# Patient Record
Sex: Male | Born: 1945
Health system: Southern US, Community
[De-identification: ages and names within clinical notes are randomized; demographics above are authoritative.]

## PROBLEM LIST (undated history)

## (undated) DIAGNOSIS — E119 Type 2 diabetes mellitus without complications: Secondary | ICD-10-CM

## (undated) DIAGNOSIS — T7840XA Allergy, unspecified, initial encounter: Secondary | ICD-10-CM

## (undated) DIAGNOSIS — I739 Peripheral vascular disease, unspecified: Secondary | ICD-10-CM

## (undated) DIAGNOSIS — L0390 Cellulitis, unspecified: Secondary | ICD-10-CM

## (undated) DIAGNOSIS — I10 Essential (primary) hypertension: Secondary | ICD-10-CM

## (undated) DIAGNOSIS — E785 Hyperlipidemia, unspecified: Secondary | ICD-10-CM

## (undated) DIAGNOSIS — C9590 Leukemia, unspecified not having achieved remission: Secondary | ICD-10-CM

## (undated) HISTORY — DX: Allergy, unspecified, initial encounter: T78.40XA

## (undated) HISTORY — DX: Peripheral vascular disease, unspecified: I73.9

## (undated) HISTORY — PX: HERNIA REPAIR: SHX51

## (undated) HISTORY — DX: Hyperlipidemia, unspecified: E78.5

## (undated) HISTORY — DX: Cellulitis, unspecified: L03.90

---

## 2006-11-29 ENCOUNTER — Emergency Department: Payer: Self-pay | Admitting: Emergency Medicine

## 2007-03-20 ENCOUNTER — Other Ambulatory Visit: Payer: Self-pay

## 2007-03-20 ENCOUNTER — Ambulatory Visit: Payer: Self-pay | Admitting: Orthopaedic Surgery

## 2007-04-23 ENCOUNTER — Ambulatory Visit: Payer: Self-pay | Admitting: Orthopaedic Surgery

## 2008-06-04 ENCOUNTER — Ambulatory Visit: Payer: Self-pay | Admitting: Urology

## 2008-06-17 ENCOUNTER — Ambulatory Visit: Payer: Self-pay | Admitting: Urology

## 2008-06-24 ENCOUNTER — Ambulatory Visit: Payer: Self-pay | Admitting: Urology

## 2009-12-16 ENCOUNTER — Ambulatory Visit: Payer: Self-pay | Admitting: Ophthalmology

## 2009-12-29 ENCOUNTER — Ambulatory Visit: Payer: Self-pay | Admitting: Ophthalmology

## 2010-01-26 ENCOUNTER — Ambulatory Visit: Payer: Self-pay | Admitting: Ophthalmology

## 2010-02-02 ENCOUNTER — Ambulatory Visit: Payer: Self-pay | Admitting: Ophthalmology

## 2015-04-26 DIAGNOSIS — G4733 Obstructive sleep apnea (adult) (pediatric): Secondary | ICD-10-CM | POA: Diagnosis not present

## 2015-04-26 DIAGNOSIS — N189 Chronic kidney disease, unspecified: Secondary | ICD-10-CM | POA: Diagnosis not present

## 2015-04-26 DIAGNOSIS — E1122 Type 2 diabetes mellitus with diabetic chronic kidney disease: Secondary | ICD-10-CM | POA: Diagnosis not present

## 2015-04-26 DIAGNOSIS — N182 Chronic kidney disease, stage 2 (mild): Secondary | ICD-10-CM | POA: Diagnosis not present

## 2015-05-10 DIAGNOSIS — H401131 Primary open-angle glaucoma, bilateral, mild stage: Secondary | ICD-10-CM | POA: Diagnosis not present

## 2015-07-22 DIAGNOSIS — I10 Essential (primary) hypertension: Secondary | ICD-10-CM | POA: Diagnosis not present

## 2015-07-22 DIAGNOSIS — E1141 Type 2 diabetes mellitus with diabetic mononeuropathy: Secondary | ICD-10-CM | POA: Diagnosis not present

## 2015-07-22 DIAGNOSIS — G4733 Obstructive sleep apnea (adult) (pediatric): Secondary | ICD-10-CM | POA: Diagnosis not present

## 2015-07-22 DIAGNOSIS — E1165 Type 2 diabetes mellitus with hyperglycemia: Secondary | ICD-10-CM | POA: Diagnosis not present

## 2015-08-21 ENCOUNTER — Emergency Department
Admission: EM | Admit: 2015-08-21 | Discharge: 2015-08-21 | Disposition: A | Discharge status: Home / Self Care | Payer: Medicare Other | Attending: Emergency Medicine | Admitting: Emergency Medicine

## 2015-08-21 ENCOUNTER — Emergency Department: Payer: Medicare Other

## 2015-08-21 ENCOUNTER — Encounter: Payer: Self-pay | Admitting: Emergency Medicine

## 2015-08-21 DIAGNOSIS — I10 Essential (primary) hypertension: Secondary | ICD-10-CM | POA: Insufficient documentation

## 2015-08-21 DIAGNOSIS — S92532B Displaced fracture of distal phalanx of left lesser toe(s), initial encounter for open fracture: Secondary | ICD-10-CM | POA: Diagnosis not present

## 2015-08-21 DIAGNOSIS — S99922A Unspecified injury of left foot, initial encounter: Secondary | ICD-10-CM | POA: Diagnosis present

## 2015-08-21 DIAGNOSIS — S91312A Laceration without foreign body, left foot, initial encounter: Secondary | ICD-10-CM

## 2015-08-21 DIAGNOSIS — S93119A Dislocation of interphalangeal joint of unspecified toe(s), initial encounter: Secondary | ICD-10-CM | POA: Diagnosis not present

## 2015-08-21 DIAGNOSIS — Y999 Unspecified external cause status: Secondary | ICD-10-CM | POA: Diagnosis not present

## 2015-08-21 DIAGNOSIS — S92512A Displaced fracture of proximal phalanx of left lesser toe(s), initial encounter for closed fracture: Secondary | ICD-10-CM | POA: Diagnosis not present

## 2015-08-21 DIAGNOSIS — S93105A Unspecified dislocation of left toe(s), initial encounter: Secondary | ICD-10-CM | POA: Diagnosis not present

## 2015-08-21 DIAGNOSIS — Y939 Activity, unspecified: Secondary | ICD-10-CM | POA: Insufficient documentation

## 2015-08-21 DIAGNOSIS — S93115A Dislocation of interphalangeal joint of left lesser toe(s), initial encounter: Secondary | ICD-10-CM | POA: Diagnosis not present

## 2015-08-21 DIAGNOSIS — S92512B Displaced fracture of proximal phalanx of left lesser toe(s), initial encounter for open fracture: Secondary | ICD-10-CM | POA: Diagnosis not present

## 2015-08-21 DIAGNOSIS — E119 Type 2 diabetes mellitus without complications: Secondary | ICD-10-CM | POA: Insufficient documentation

## 2015-08-21 DIAGNOSIS — Y929 Unspecified place or not applicable: Secondary | ICD-10-CM | POA: Diagnosis not present

## 2015-08-21 DIAGNOSIS — S92912B Unspecified fracture of left toe(s), initial encounter for open fracture: Secondary | ICD-10-CM

## 2015-08-21 DIAGNOSIS — W268XXA Contact with other sharp object(s), not elsewhere classified, initial encounter: Secondary | ICD-10-CM | POA: Diagnosis not present

## 2015-08-21 HISTORY — DX: Essential (primary) hypertension: I10

## 2015-08-21 HISTORY — DX: Type 2 diabetes mellitus without complications: E11.9

## 2015-08-21 MED ORDER — CEFAZOLIN IN D5W 1 GM/50ML IV SOLN
1.0000 g | Freq: Once | INTRAVENOUS | Status: AC
  Administered 2015-08-21: 1 g via INTRAVENOUS
  Filled 2015-08-21: qty 50

## 2015-08-21 MED ORDER — TETANUS-DIPHTH-ACELL PERTUSSIS 5-2.5-18.5 LF-MCG/0.5 IM SUSP
0.5000 mL | Freq: Once | INTRAMUSCULAR | Status: AC
  Administered 2015-08-21: 0.5 mL via INTRAMUSCULAR
  Filled 2015-08-21: qty 0.5

## 2015-08-21 MED ORDER — LIDOCAINE HCL (PF) 1 % IJ SOLN
2.0000 mL | Freq: Once | INTRAMUSCULAR | Status: AC
  Administered 2015-08-21: 2 mL
  Filled 2015-08-21: qty 5

## 2015-08-21 MED ORDER — CEFAZOLIN SODIUM 1 G IJ SOLR
1.0000 g | Freq: Once | INTRAMUSCULAR | Status: DC
  Filled 2015-08-21: qty 10

## 2015-08-21 MED ORDER — CEPHALEXIN 500 MG PO CAPS: 500.0000 mg | ORAL_CAPSULE | Freq: Three times a day (TID) | ORAL | Status: AC

## 2015-08-21 MED ORDER — SULFAMETHOXAZOLE-TRIMETHOPRIM 800-160 MG PO TABS: 1.0000 | ORAL_TABLET | Freq: Two times a day (BID) | ORAL | Status: DC

## 2015-08-21 MED ORDER — HYDROCODONE-ACETAMINOPHEN 5-325 MG PO TABS: 1.0000 | ORAL_TABLET | Freq: Four times a day (QID) | ORAL | Status: DC | PRN

## 2015-08-21 NOTE — ED Notes (Signed)
Patient states that he fell this morning around 11 am. Patient was seen at [facility] urgent care and had x-ray done, was sent to ER for open fracture of the fifth digit on left foot.

## 2015-08-21 NOTE — ED Notes (Addendum)
States he fell   Injury to left 5 th toe  Went to [facility] urgent care   Was sent here d/t possible open fx ot toe  Was given rocephin IM Pta

## 2015-08-21 NOTE — ED Provider Notes (Signed)
CSN: [id number]     Arrival date & time 08/21/15  1353 History   First MD Initiated Contact with Patient 08/21/15 1418     Chief Complaint  Patient presents with  . Foot Pain     (Consider location/radiation/quality/duration/timing/severity/associated sxs/prior Treatment) HPI  70 year old male presents to the emergency department for evaluation of left fifth toe pain, laceration and fracture. He was seen just prior to arrival [facility], x-ray showed dislocation of the left fifth PIP joint with small avulsion fracture. Patient states his injury occurred at 11 AM this morning. He was standing on the porch when his knee gave out, and he believes he cut his left foot on the corner of a concrete. He was wearing flip-flops. His tetanus is not up-to-date. He has a laceration between the fourth and fifth toe along the webspace. His pain is 1 out of 10. He has a history of diabetes, last hemoglobin A1c was around 6. He does have some neuropathy in both feet that is mild. He is able to ambulate with no assisted devices. He has not had any medications for pain.  Past Medical History  Diagnosis Date  . Diabetes mellitus without complication ([place])   . Hypertension    Past Surgical History  Procedure Laterality Date  . Hernia repair     No family history on file. Social History  Substance Use Topics  . Smoking status: Never Smoker   . Smokeless tobacco: None  . Alcohol Use: No    Review of Systems  Constitutional: Negative.  Negative for fever, chills, activity change and appetite change.  HENT: Negative for congestion, ear pain, mouth sores, rhinorrhea, sinus pressure, sore throat and trouble swallowing.   Eyes: Negative for photophobia, pain and discharge.  Respiratory: Negative for cough, chest tightness and shortness of breath.   Cardiovascular: Negative for chest pain and leg swelling.  Gastrointestinal: Negative for nausea, vomiting, abdominal pain, diarrhea and abdominal  distention.  Genitourinary: Negative for dysuria and difficulty urinating.  Musculoskeletal: Negative for back pain, arthralgias and gait problem.  Skin: Positive for wound. Negative for color change and rash.  Neurological: Negative for dizziness and headaches.  Hematological: Negative for adenopathy.  Psychiatric/Behavioral: Negative for behavioral problems and agitation.      Allergies  Review of patient's allergies indicates no known allergies.  Home Medications   Prior to Admission medications   Medication Sig Start Date End Date Taking? Authorizing Provider  cephALEXin (KEFLEX) 500 MG capsule Take 1 capsule (500 mg total) by mouth 3 (three) times daily. X 7 days 08/21/15 08/31/15  Duanne Guess, PA-C  HYDROcodone-acetaminophen (NORCO) 5-325 MG tablet Take 1 tablet by mouth every 6 (six) hours as needed for moderate pain. 08/21/15   Duanne Guess, PA-C  sulfamethoxazole-trimethoprim (BACTRIM DS,SEPTRA DS) 800-160 MG tablet Take 1 tablet by mouth 2 (two) times daily. X 7 days 08/21/15   Duanne Guess, PA-C   BP 173/101 mmHg  Pulse 67  Temp(Src) 98 F (36.7 C) (Oral)  Resp 20  Ht 6\' 3"  (1.905 m)  Wt 158.759 kg  BMI 43.75 kg/m2  SpO2 97% Physical Exam  Constitutional: He is oriented to person, place, and time. He appears well-developed and well-nourished.  HENT:  Head: Normocephalic and atraumatic.  Eyes: Conjunctivae and EOM are normal. Pupils are equal, round, and reactive to light.  Neck: Normal range of motion. Neck supple.  Cardiovascular: Normal rate and intact distal pulses.   No murmur heard. Pulmonary/Chest: Effort normal  and breath sounds normal. No respiratory distress.  Musculoskeletal:  Examination of the left foot shows the patient has deformed abducted left fifth digit at the PIP joint. In between the fourth and fifth toe along the webspace there is a 4 cm linear laceration. Bleeding is well controlled. Sensation is intact throughout the left foot. He has 2+  cap refill throughout the digits. Laceration was reviewed with a bloodless field after significant thorough irrigation with Betadine and saline. There was no sign of foreign body. I was able to visualize the distal proximal phalanx, there was soft tissue present in between the proximal and middle phalanx. Soft tissue was removed and laceration was repaired. Fourth and fifth toe were buddy taped.  Neurological: He is alert and oriented to person, place, and time.  Skin: Skin is warm and dry.  Psychiatric: He has a normal mood and affect. His behavior is normal. Judgment and thought content normal.    ED Course  Procedures (including critical care time)  Closed reduction left fifth PIP joint: Patient underwent a closed reduction of the left fifth PIP joint of the left foot. Patient tolerated the procedure well. There was partial reduction. Patient underwent a second reduction after soft tissue was removed from the PIP joint. Second reduction was successful with good reduction. Toe was buddy taped. Patient tolerated procedure well.  LACERATION REPAIR Performed by: Feliberto Gottron Authorized by: Feliberto Gottron Consent: Verbal consent obtained. Risks and benefits: risks, benefits and alternatives were discussed Consent given by: patient Patient identity confirmed: provided demographic data Prepped and Draped in normal sterile fashion Wound explored  Laceration Location: Left foot  Laceration Length: 4cm  No Foreign Bodies seen or palpated  Anesthesia: local infiltration  Local anesthetic: lidocaine 1%  without epinephrine  Anesthetic total: 2 ml  Irrigation method: syringe Amount of cleaning: standard  Skin closure: #6 5-0 nylon   Number of sutures: 6   Technique: Simple interrupted   Patient tolerance: Patient tolerated the procedure well with no immediate complications.  Labs Review Labs Reviewed - No data to display  Imaging Review Dg Toe 5th  Left  08/21/2015  CLINICAL DATA:  Post reduction EXAM: DG TOE 5TH LEFT COMPARISON:  08/21/2015 FINDINGS: Partial reduction of the fifth PIP dislocation. Small fracture fragments noted. IMPRESSION: Partial reduction of fifth DIP joint. Small fracture fragments present around the joint. Electronically Signed   By: Franchot Gallo M.D.   On: 08/21/2015 15:41   Dg Toe 5th Left  08/21/2015  CLINICAL DATA:  Left fifth toe injury EXAM: DG TOE 5TH LEFT COMPARISON:  None. FINDINGS: There is anterolateral dislocation of the left fifth toe at the level of the proximal interphalangeal joint, with tiny densities surrounding the lateral margin of the proximal interphalangeal joint suspicious for tiny avulsion fragments. Surrounding soft tissue swelling. No suspicious focal osseous lesion. No radiopaque foreign body. IMPRESSION: Anterolateral dislocation of the left fifth toe at the PIP joint, with adjacent tiny densities suspicious for avulsion fragments. Electronically Signed   By: Ilona Sorrel M.D.   On: 08/21/2015 15:01   I have personally reviewed and evaluated these images and lab results as part of my medical decision-making.   EKG Interpretation None      MDM   Final diagnoses:  Toe joint dislocation, left, initial encounter  Fracture, toe, left, open, initial encounter  Laceration of foot, left, initial encounter    70 year old male with left fifth toe PIP fracture/dislocation/laceration with ultimately open fracture. He was given  Ancef 1 g IV. After 2 reductions there was significantly improved alignment of the PIP joint. Open fracture with laceration was thoroughly irrigated with saline and Betadine and repaired with 6 5-0 nylon sutures. The toes are buddy taped and a sterile dressing was applied. Patient will call Dr. Alvera Singh office first thing Monday morning. They will keep clean and dry. Keflex and Bactrim prescribed for prophylaxis. Norco as needed for pain. Return to ER for any worsening symptoms  or urgent changes in his health.    Duanne Guess, PA-C 08/21/15 1701  Lisa Roca, MD 08/22/15 [phone]

## 2015-08-21 NOTE — Discharge Instructions (Signed)
Toe Dislocation Toe dislocation is the displacement of the large bone of your toe (metatarsal) from the socket that connects it to your foot. Very strong, fibrous tissues (ligaments) connect your toe to a bone in your foot and stabilize the joint where these two bones meet. Dislocation is caused by a forceful impact to your toe. This impact moves your toe off the joint and often injures the ligaments that support it. SYMPTOMS Symptoms of toe dislocation include:  Noticeable deformity of your toe.  Pain, with loss of movement.  Looseness in your joint, indicating a tear of the ligaments (severe dislocations). DIAGNOSIS Toe dislocation is diagnosed through a physical exam. Often, X-ray exams are done to see if you have associated injuries, such as bone fractures. TREATMENT Toe dislocations are treated by putting your bones back into position (reduction) either by manipulation or through surgery. The toe is then kept in a fixed position (immobilized) in a cast, splint, or rigid postoperative shoe. In rare cases, surgical repair of a ligament is required, followed by immobilization of the toe.  HOME CARE INSTRUCTIONS The following measures can help to reduce pain and speed up the healing process:  Rest your injured joint. Do not move it. Avoid activities similar to the one that caused your injury.  Apply ice to your injured joint for 1 to 2 days after your reduction or as directed by your caregiver. Applying ice helps to reduce inflammation and pain.  Put ice in a plastic bag.  Place a towel between your skin and the bag.  Leave the ice on for 15 to 20 minutes at a time, every 2 hours while you are awake.  Elevate your foot above your heart as directed by your caregiver.  Take over-the-counter or prescription medicine for pain as directed by your caregiver.  If your caregiver has taped your toe to an adjacent toe, change the tape as directed by your caregiver. SEEK IMMEDIATE MEDICAL CARE  IF:  Your cast or splint becomes loose or damaged.  Your pain becomes worse, not better.  You lose feeling in your toe, or you cannot bend the tip of your toe. MAKE SURE YOU:  Understand these instructions.  Will watch your condition.  Will get help right away if you are not doing well or get worse.   This information is not intended to replace advice given to you by your health care provider. Make sure you discuss any questions you have with your health care provider.   Document Released: 11/01/2000 Document Revised: 02/27/2014 Document Reviewed: 08/12/2014 Elsevier Interactive Patient Education 2016 [employer]  Toe Fracture With Rehab A fracture is a break in the bone that can be either partial or complete. Fractures of the toe bones may or may not include the joints that separate the bones. SYMPTOMS   Severe pain over the fracture site at the time of injury that may persist for an extend period of time.  Pain, tenderness, inflammation, and/or bruising (contusion) over the fracture site.  Visible deformity, if the bone fragments are not properly aligned (displaced fracture).  Signs of vascular damage: numbness or coldness (uncommon). CAUSES  Toe fractures occur when a force is placed on the bone that is greater than it can withstand.  Direct hit (trauma) to the toe.  Indirect trauma to the toe, such as forcefully pivoting on a planted foot. RISK INCREASES WITH:  Performing activities barefoot (i.e. ballet, gymnastics).  Wearing shoes with little support or protection.  Sports with cleats (i.e.  football, rugby, lacrosse, soccer).  Bone disease (i.e. osteoporosis, bone tumors). PREVENTION   Wear properly fitted and protective shoes.  Protect previously injured toes with tape or padding. PROGNOSIS  If treated properly, toe fractures usually heal within 4 to 6 weeks. RELATED COMPLICATIONS   Failure of the fracture to heal (nonunion).  Healing of the fracture  in a poor position (malunion).  Recurring symptoms.  Recurring symptoms that result in a chronic problem.  Excessive bleeding, causing pressure on nerves and blood vessels (rare).  Arthritis of the affected joints.  Stopping of bone growth in children.  Infection in fractures where the skin is broken over the fracture (open fracture).  Shortening of injured bones. TREATMENT  Treatment first involves the use of ice and medicine to reduce pain and inflammation. The toe should be restrained for a period of time to allow for healing, usually about 4 weeks. Your caregiver may advise wearing a hard-soled shoe to minimize stress on the healing bone. Surgery is uncommon for this injury, but may be necessary if the fracture is severely displaced or if the bone pushes through the skin. Surgery typically involves the use of screws, pins, and/or plates to hold the fracture in place. After surgery, restraint of the foot is necessary. MEDICATION   If pain medicine is necessary, nonsteroidal anti-inflammatory medications (aspirin and ibuprofen), or other minor pain relievers (acetaminophen), are often recommended.  Do not take pain medicine for 7 days before surgery.  Prescription pain relievers may be given if your caregiver thinks they are needed. Use only as directed and only as much as you need. COLD THERAPY  Cold treatment (icing) relieves pain and reduces inflammation. Cold treatment should be applied for 10 to 15 minutes every 2 to 3 hours, and immediately after activity that aggravates your symptoms. Use ice packs or an ice massage. SEEK MEDICAL CARE IF:   Treatment does not seem to help, or the condition gets worse.  Any medicines produce negative side effects.  Any complications from surgery occur:  Pain, numbness, or coldness in the affected foot.  Discoloration beneath the toenails (blue or gray) of the affected foot.  Signs of infection (fever, pain, inflammation, redness, or  persistent bleeding). EXERCISES RANGE OF MOTION (ROM) AND STRETCHING EXERCISES - Toe Fracture (Phalangeal) These exercises may help you when beginning to rehabilitate your injury. Your symptoms may resolve with or without further involvement from your physician, physical therapist or athletic trainer. While completing these exercises, remember:   Restoring tissue flexibility helps normal motion to return to the joints. This allows healthier, less painful movement and activity.  An effective stretch should be held for at least 30 seconds.  A stretch should never be painful. You should only feel a gentle lengthening or release in the stretched tissue. RANGE OF MOTION - Dorsi/Plantar Flexion  While sitting with your right / left knee straight, draw the top of your foot upwards by flexing your ankle. Then reverse the motion, pointing your toes downward.  Hold each position for __________ seconds.  After completing your first set of exercises, repeat this exercise with your knee bent. Repeat __________ times. Complete this exercise __________ times per day.  RANGE OF MOTION - Ankle Alphabet Imagine your right / left big toe is a pen. Keeping your hip and knee still, write out the entire alphabet with your "pen." Make the letters as large as you can without increasing any discomfort. Repeat __________ times. Complete this exercise __________ times per day.  RANGE  OF MOTION - Toe Extension, Flexion  Sit with your right / left leg crossed over your opposite knee.  Grasp your toes and gently pull them back toward the top of your foot. You should feel a stretch on the bottom of your toes and foot.  Hold this stretch for __________ seconds.  Now, gently pull your toes toward the bottom of your foot. You should feel a stretch on the top of your toes and foot.  Hold this stretch for __________ seconds. Repeat __________ times. Complete this stretch__________ times per day.  STRENGTHENING  EXERCISES - Toe Fracture (Phalangeal) These exercises may help you when beginning to rehabilitate your injury. They may resolve your symptoms with or without further involvement from your physician, physical therapist or athletic trainer. While completing these exercises, remember:   Muscles can gain both the endurance and the strength needed for everyday activities through controlled exercises.  Complete these exercises as instructed by your physician, physical therapist or athletic trainer. Increase the resistance and repetitions only as guided.  You may experience muscle soreness or fatigue, but the pain or discomfort you are trying to eliminate should never worsen during these exercises. If this pain does get worse, stop and make sure you are following the directions exactly. If the pain is still present after adjustments, discontinue the exercise until you can discuss the trouble with your clinician. STRENGTH - Towel Curls  Sit in a chair, on a non-carpeted surface.  Place your foot on a towel, keeping your heel on the floor.  Pull the towel toward your heel only by curling your toes. Keep your heel on the floor.  If instructed by your physician, physical therapist or athletic trainer, add ____________________ at the end of the towel. Repeat __________ times. Complete this exercise __________ times per day.   This information is not intended to replace advice given to you by your health care provider. Make sure you discuss any questions you have with your health care provider.   Document Released: 02/06/2005 Document Revised: 06/23/2014 Document Reviewed: 05/21/2008 Elsevier Interactive Patient Education 2016 [place], Adult A laceration is a cut that goes through all of the layers of the skin and into the tissue that is right under the skin. Some lacerations heal on their own. Others need to be closed with stitches (sutures), staples, skin adhesive strips, or  skin glue. Proper laceration care minimizes the risk of infection and helps the laceration to heal better. HOW TO CARE FOR YOUR LACERATION If sutures or staples were used:  Keep the wound clean and dry.  If you were given a bandage (dressing), you should change it at least one time per day or as told by your health care provider. You should also change it if it becomes wet or dirty.  Keep the wound completely dry for the first 24 hours or as told by your health care provider. After that time, you may shower or bathe. However, make sure that the wound is not soaked in water until after the sutures or staples have been removed.  Clean the wound one time each day or as told by your health care provider:  Wash the wound with soap and water.  Rinse the wound with water to remove all soap.  Pat the wound dry with a clean towel. Do not rub the wound.  After cleaning the wound, apply a thin layer of antibiotic ointmentas told by your health care provider. This will help to prevent infection  and keep the dressing from sticking to the wound.  Have the sutures or staples removed as told by your health care provider. If skin adhesive strips were used:  Keep the wound clean and dry.  If you were given a bandage (dressing), you should change it at least one time per day or as told by your health care provider. You should also change it if it becomes dirty or wet.  Do not get the skin adhesive strips wet. You may shower or bathe, but be careful to keep the wound dry.  If the wound gets wet, pat it dry with a clean towel. Do not rub the wound.  Skin adhesive strips fall off on their own. You may trim the strips as the wound heals. Do not remove skin adhesive strips that are still stuck to the wound. They will fall off in time. If skin glue was used:  Try to keep the wound dry, but you may briefly wet it in the shower or bath. Do not soak the wound in water, such as by swimming.  After you have  showered or bathed, gently pat the wound dry with a clean towel. Do not rub the wound.  Do not do any activities that will make you sweat heavily until the skin glue has fallen off on its own.  Do not apply liquid, cream, or ointment medicine to the wound while the skin glue is in place. Using those may loosen the film before the wound has healed.  If you were given a bandage (dressing), you should change it at least one time per day or as told by your health care provider. You should also change it if it becomes dirty or wet.  If a dressing is placed over the wound, be careful not to apply tape directly over the skin glue. Doing that may cause the glue to be pulled off before the wound has healed.  Do not pick at the glue. The skin glue usually remains in place for 5-10 days, then it falls off of the skin. General Instructions  Take over-the-counter and prescription medicines only as told by your health care provider.  If you were prescribed an antibiotic medicine or ointment, take or apply it as told by your doctor. Do not stop using it even if your condition improves.  To help prevent scarring, make sure to cover your wound with sunscreen whenever you are outside after stitches are removed, after adhesive strips are removed, or when glue remains in place and the wound is healed. Make sure to wear a sunscreen of at least 30 SPF.  Do not scratch or pick at the wound.  Keep all follow-up visits as told by your health care provider. This is important.  Check your wound every day for signs of infection. Watch for:  Redness, swelling, or pain.  Fluid, blood, or pus.  Raise (elevate) the injured area above the level of your heart while you are sitting or lying down, if possible. SEEK MEDICAL CARE IF:  You received a tetanus shot and you have swelling, severe pain, redness, or bleeding at the injection site.  You have a fever.  A wound that was closed breaks open.  You notice a bad  smell coming from your wound or your dressing.  You notice something coming out of the wound, such as wood or glass.  Your pain is not controlled with medicine.  You have increased redness, swelling, or pain at the site of your wound.  You have fluid, blood, or pus coming from your wound.  You notice a change in the color of your skin near your wound.  You need to change the dressing frequently due to fluid, blood, or pus draining from the wound.  You develop a new rash.  You develop numbness around the wound. SEEK IMMEDIATE MEDICAL CARE IF:  You develop severe swelling around the wound.  Your pain suddenly increases and is severe.  You develop painful lumps near the wound or on skin that is anywhere on your body.  You have a red streak going away from your wound.  The wound is on your hand or foot and you cannot properly move a finger or toe.  The wound is on your hand or foot and you notice that your fingers or toes look pale or bluish.   This information is not intended to replace advice given to you by your health care provider. Make sure you discuss any questions you have with your health care provider.   Document Released: 02/06/2005 Document Revised: 06/23/2014 Document Reviewed: 02/02/2014 Elsevier Interactive Patient Education [employer].   Please keep the left fourth and fifth toe buddy taped. You can change the buddy tape daily. Please cleanse around the laceration 3 times daily with half Water and half hydrogen peroxide. Try to keep the laceration site clean and dry. Please take antibiotics as prescribed. Please call Dr. Alvera Singh office Monday morning to schedule an appointment.

## 2015-08-23 DIAGNOSIS — S91105A Unspecified open wound of left lesser toe(s) without damage to nail, initial encounter: Secondary | ICD-10-CM | POA: Diagnosis not present

## 2015-08-23 DIAGNOSIS — S93105A Unspecified dislocation of left toe(s), initial encounter: Secondary | ICD-10-CM | POA: Diagnosis not present

## 2015-08-27 DIAGNOSIS — S93105D Unspecified dislocation of left toe(s), subsequent encounter: Secondary | ICD-10-CM | POA: Diagnosis not present

## 2015-08-27 DIAGNOSIS — S91105D Unspecified open wound of left lesser toe(s) without damage to nail, subsequent encounter: Secondary | ICD-10-CM | POA: Diagnosis not present

## 2015-08-31 DIAGNOSIS — S93105D Unspecified dislocation of left toe(s), subsequent encounter: Secondary | ICD-10-CM | POA: Diagnosis not present

## 2015-09-07 DIAGNOSIS — S91105D Unspecified open wound of left lesser toe(s) without damage to nail, subsequent encounter: Secondary | ICD-10-CM | POA: Diagnosis not present

## 2015-09-07 DIAGNOSIS — S93105D Unspecified dislocation of left toe(s), subsequent encounter: Secondary | ICD-10-CM | POA: Diagnosis not present

## 2015-09-09 DIAGNOSIS — H401131 Primary open-angle glaucoma, bilateral, mild stage: Secondary | ICD-10-CM | POA: Diagnosis not present

## 2015-09-21 DIAGNOSIS — S93105D Unspecified dislocation of left toe(s), subsequent encounter: Secondary | ICD-10-CM | POA: Diagnosis not present

## 2015-09-21 DIAGNOSIS — S91105D Unspecified open wound of left lesser toe(s) without damage to nail, subsequent encounter: Secondary | ICD-10-CM | POA: Diagnosis not present

## 2015-10-26 DIAGNOSIS — E1140 Type 2 diabetes mellitus with diabetic neuropathy, unspecified: Secondary | ICD-10-CM | POA: Diagnosis not present

## 2015-10-26 DIAGNOSIS — E1165 Type 2 diabetes mellitus with hyperglycemia: Secondary | ICD-10-CM | POA: Diagnosis not present

## 2015-10-26 DIAGNOSIS — G4733 Obstructive sleep apnea (adult) (pediatric): Secondary | ICD-10-CM | POA: Diagnosis not present

## 2015-10-26 DIAGNOSIS — I10 Essential (primary) hypertension: Secondary | ICD-10-CM | POA: Diagnosis not present

## 2015-10-26 DIAGNOSIS — Z23 Encounter for immunization: Secondary | ICD-10-CM | POA: Diagnosis not present

## 2015-11-19 DIAGNOSIS — I10 Essential (primary) hypertension: Secondary | ICD-10-CM | POA: Diagnosis not present

## 2015-11-19 DIAGNOSIS — E1165 Type 2 diabetes mellitus with hyperglycemia: Secondary | ICD-10-CM | POA: Diagnosis not present

## 2015-11-19 DIAGNOSIS — J209 Acute bronchitis, unspecified: Secondary | ICD-10-CM | POA: Diagnosis not present

## 2015-11-19 DIAGNOSIS — G4733 Obstructive sleep apnea (adult) (pediatric): Secondary | ICD-10-CM | POA: Diagnosis not present

## 2015-11-23 DIAGNOSIS — J209 Acute bronchitis, unspecified: Secondary | ICD-10-CM | POA: Diagnosis not present

## 2015-11-23 DIAGNOSIS — E1122 Type 2 diabetes mellitus with diabetic chronic kidney disease: Secondary | ICD-10-CM | POA: Diagnosis not present

## 2015-11-23 DIAGNOSIS — G4733 Obstructive sleep apnea (adult) (pediatric): Secondary | ICD-10-CM | POA: Diagnosis not present

## 2015-11-23 DIAGNOSIS — E1141 Type 2 diabetes mellitus with diabetic mononeuropathy: Secondary | ICD-10-CM | POA: Diagnosis not present

## 2016-02-04 DIAGNOSIS — L820 Inflamed seborrheic keratosis: Secondary | ICD-10-CM | POA: Diagnosis not present

## 2016-02-04 DIAGNOSIS — L918 Other hypertrophic disorders of the skin: Secondary | ICD-10-CM | POA: Diagnosis not present

## 2016-02-04 DIAGNOSIS — L821 Other seborrheic keratosis: Secondary | ICD-10-CM | POA: Diagnosis not present

## 2016-02-04 DIAGNOSIS — D225 Melanocytic nevi of trunk: Secondary | ICD-10-CM | POA: Diagnosis not present

## 2016-03-14 DIAGNOSIS — H401131 Primary open-angle glaucoma, bilateral, mild stage: Secondary | ICD-10-CM | POA: Diagnosis not present

## 2016-03-21 DIAGNOSIS — H401131 Primary open-angle glaucoma, bilateral, mild stage: Secondary | ICD-10-CM | POA: Diagnosis not present

## 2016-07-07 DIAGNOSIS — E669 Obesity, unspecified: Secondary | ICD-10-CM | POA: Diagnosis not present

## 2016-07-07 DIAGNOSIS — R0602 Shortness of breath: Secondary | ICD-10-CM | POA: Diagnosis not present

## 2016-07-07 DIAGNOSIS — E784 Other hyperlipidemia: Secondary | ICD-10-CM | POA: Diagnosis not present

## 2016-07-07 DIAGNOSIS — E1165 Type 2 diabetes mellitus with hyperglycemia: Secondary | ICD-10-CM | POA: Diagnosis not present

## 2016-09-12 DIAGNOSIS — Z0000 Encounter for general adult medical examination without abnormal findings: Secondary | ICD-10-CM | POA: Diagnosis not present

## 2016-09-13 DIAGNOSIS — H401131 Primary open-angle glaucoma, bilateral, mild stage: Secondary | ICD-10-CM | POA: Diagnosis not present

## 2016-09-15 DIAGNOSIS — E119 Type 2 diabetes mellitus without complications: Secondary | ICD-10-CM | POA: Diagnosis not present

## 2016-09-15 DIAGNOSIS — R5381 Other malaise: Secondary | ICD-10-CM | POA: Diagnosis not present

## 2016-09-15 DIAGNOSIS — I10 Essential (primary) hypertension: Secondary | ICD-10-CM | POA: Diagnosis not present

## 2016-09-15 DIAGNOSIS — Z125 Encounter for screening for malignant neoplasm of prostate: Secondary | ICD-10-CM | POA: Diagnosis not present

## 2016-09-19 DIAGNOSIS — G4733 Obstructive sleep apnea (adult) (pediatric): Secondary | ICD-10-CM | POA: Diagnosis not present

## 2016-09-19 DIAGNOSIS — N182 Chronic kidney disease, stage 2 (mild): Secondary | ICD-10-CM | POA: Diagnosis not present

## 2016-09-19 DIAGNOSIS — I10 Essential (primary) hypertension: Secondary | ICD-10-CM | POA: Diagnosis not present

## 2016-09-19 DIAGNOSIS — E1141 Type 2 diabetes mellitus with diabetic mononeuropathy: Secondary | ICD-10-CM | POA: Diagnosis not present

## 2016-10-25 DIAGNOSIS — E669 Obesity, unspecified: Secondary | ICD-10-CM | POA: Diagnosis not present

## 2016-10-25 DIAGNOSIS — I10 Essential (primary) hypertension: Secondary | ICD-10-CM | POA: Diagnosis not present

## 2016-10-25 DIAGNOSIS — Z87438 Personal history of other diseases of male genital organs: Secondary | ICD-10-CM | POA: Diagnosis not present

## 2016-10-25 DIAGNOSIS — E1140 Type 2 diabetes mellitus with diabetic neuropathy, unspecified: Secondary | ICD-10-CM | POA: Diagnosis not present

## 2016-11-01 DIAGNOSIS — I10 Essential (primary) hypertension: Secondary | ICD-10-CM | POA: Diagnosis not present

## 2016-11-01 DIAGNOSIS — E1141 Type 2 diabetes mellitus with diabetic mononeuropathy: Secondary | ICD-10-CM | POA: Diagnosis not present

## 2016-11-01 DIAGNOSIS — E1140 Type 2 diabetes mellitus with diabetic neuropathy, unspecified: Secondary | ICD-10-CM | POA: Diagnosis not present

## 2016-11-01 DIAGNOSIS — Z87438 Personal history of other diseases of male genital organs: Secondary | ICD-10-CM | POA: Diagnosis not present

## 2016-11-15 DIAGNOSIS — E669 Obesity, unspecified: Secondary | ICD-10-CM | POA: Diagnosis not present

## 2016-11-15 DIAGNOSIS — E1122 Type 2 diabetes mellitus with diabetic chronic kidney disease: Secondary | ICD-10-CM | POA: Diagnosis not present

## 2016-11-15 DIAGNOSIS — N182 Chronic kidney disease, stage 2 (mild): Secondary | ICD-10-CM | POA: Diagnosis not present

## 2016-11-15 DIAGNOSIS — E1165 Type 2 diabetes mellitus with hyperglycemia: Secondary | ICD-10-CM | POA: Diagnosis not present

## 2016-12-13 DIAGNOSIS — E669 Obesity, unspecified: Secondary | ICD-10-CM | POA: Diagnosis not present

## 2016-12-13 DIAGNOSIS — I10 Essential (primary) hypertension: Secondary | ICD-10-CM | POA: Diagnosis not present

## 2016-12-13 DIAGNOSIS — E1165 Type 2 diabetes mellitus with hyperglycemia: Secondary | ICD-10-CM | POA: Diagnosis not present

## 2016-12-13 DIAGNOSIS — G4733 Obstructive sleep apnea (adult) (pediatric): Secondary | ICD-10-CM | POA: Diagnosis not present

## 2017-01-25 IMAGING — DX DG TOE 5TH 2+V*L*
3 series · 3 of 3 positions shown · non-contrast
Comparison: None.

CLINICAL DATA: Postreduction left fifth toe dislocation.

EXAM:
DG TOE 5TH LEFT

[toe ap]
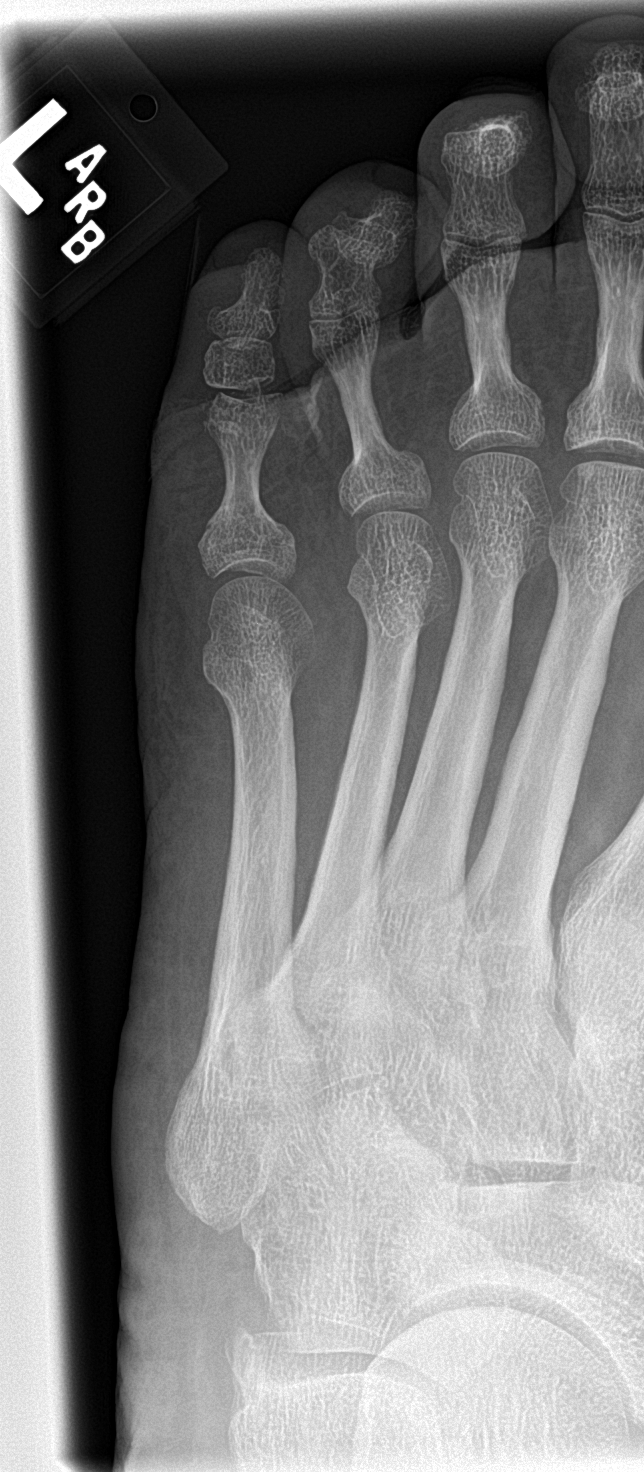

[toe obl]
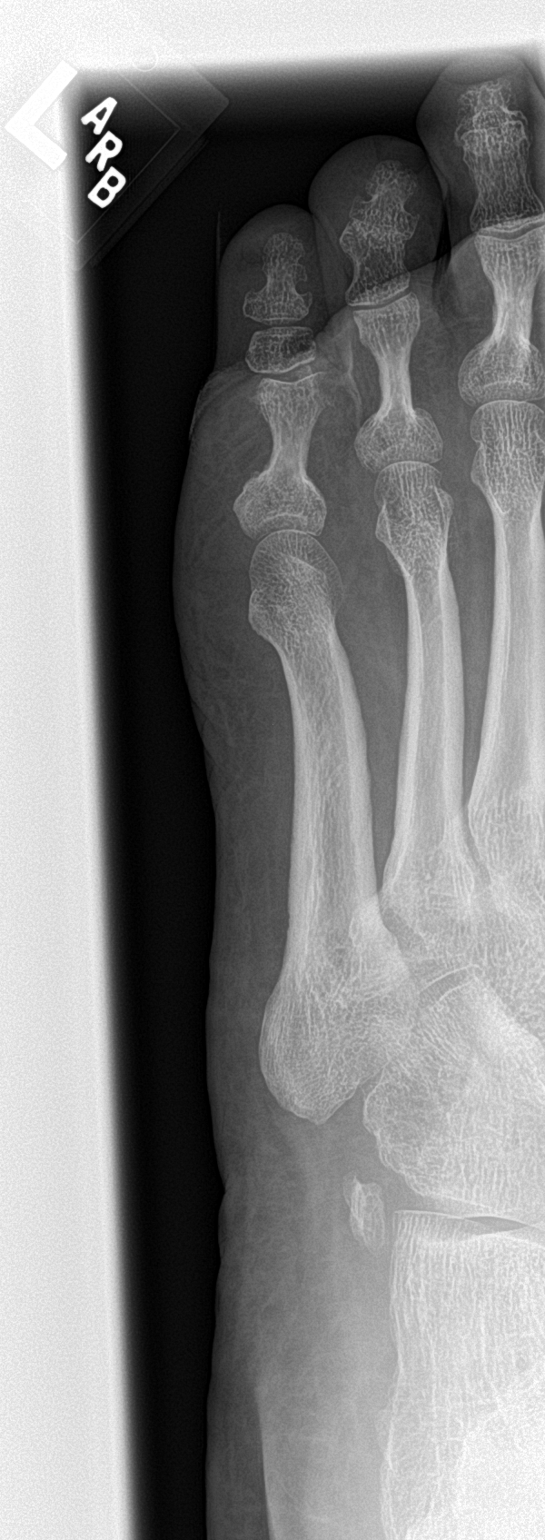

[toe lat]
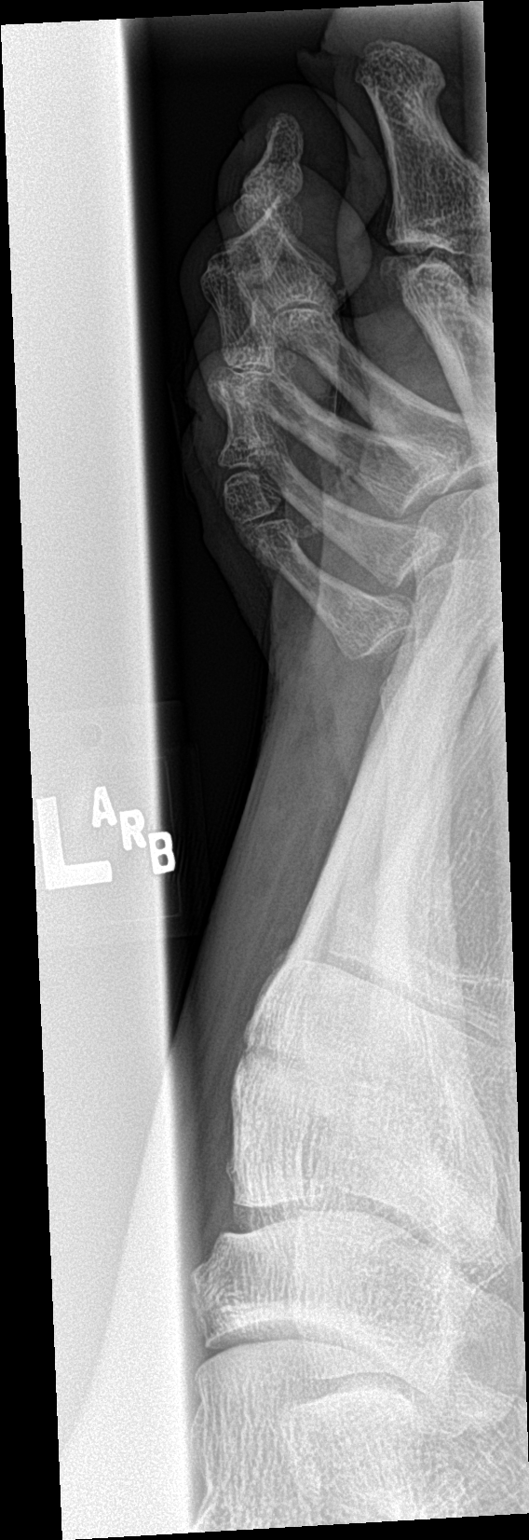

[3 of 3 positions shown; findings below may reference images not displayed]

FINDINGS: Interval improvement of alignment at the PIP joint of the little toe
in the interval. As noted on the prior study, tiny fracture
fragments are seen about the PIP joint.
IMPRESSION: Improved alignment since prior study although there may be some
minimal persistent subluxation of the PIP joint.

## 2017-03-05 DIAGNOSIS — H401131 Primary open-angle glaucoma, bilateral, mild stage: Secondary | ICD-10-CM | POA: Diagnosis not present

## 2017-03-14 DIAGNOSIS — H401131 Primary open-angle glaucoma, bilateral, mild stage: Secondary | ICD-10-CM | POA: Diagnosis not present

## 2017-03-22 DIAGNOSIS — E1165 Type 2 diabetes mellitus with hyperglycemia: Secondary | ICD-10-CM | POA: Diagnosis not present

## 2017-03-22 DIAGNOSIS — N182 Chronic kidney disease, stage 2 (mild): Secondary | ICD-10-CM | POA: Diagnosis not present

## 2017-03-22 DIAGNOSIS — E669 Obesity, unspecified: Secondary | ICD-10-CM | POA: Diagnosis not present

## 2017-03-23 DIAGNOSIS — I10 Essential (primary) hypertension: Secondary | ICD-10-CM | POA: Diagnosis not present

## 2017-03-23 DIAGNOSIS — E119 Type 2 diabetes mellitus without complications: Secondary | ICD-10-CM | POA: Diagnosis not present

## 2017-03-23 DIAGNOSIS — E7849 Other hyperlipidemia: Secondary | ICD-10-CM | POA: Diagnosis not present

## 2017-03-23 DIAGNOSIS — R5381 Other malaise: Secondary | ICD-10-CM | POA: Diagnosis not present

## 2017-03-30 DIAGNOSIS — E1140 Type 2 diabetes mellitus with diabetic neuropathy, unspecified: Secondary | ICD-10-CM | POA: Diagnosis not present

## 2017-03-30 DIAGNOSIS — E118 Type 2 diabetes mellitus with unspecified complications: Secondary | ICD-10-CM | POA: Diagnosis not present

## 2017-03-30 DIAGNOSIS — E1165 Type 2 diabetes mellitus with hyperglycemia: Secondary | ICD-10-CM | POA: Diagnosis not present

## 2017-03-30 DIAGNOSIS — G4733 Obstructive sleep apnea (adult) (pediatric): Secondary | ICD-10-CM | POA: Diagnosis not present

## 2017-04-09 ENCOUNTER — Ambulatory Visit (INDEPENDENT_AMBULATORY_CARE_PROVIDER_SITE_OTHER): Payer: Medicare Other | Admitting: Vascular Surgery

## 2017-04-09 ENCOUNTER — Encounter (INDEPENDENT_AMBULATORY_CARE_PROVIDER_SITE_OTHER): Payer: Self-pay | Admitting: Vascular Surgery

## 2017-04-09 VITALS — BP 159/90 | HR 74 | Resp 17 | Ht 74.0 in | Wt 366.0 lb

## 2017-04-09 DIAGNOSIS — I10 Essential (primary) hypertension: Secondary | ICD-10-CM | POA: Diagnosis not present

## 2017-04-09 DIAGNOSIS — R600 Localized edema: Secondary | ICD-10-CM

## 2017-04-09 DIAGNOSIS — E785 Hyperlipidemia, unspecified: Secondary | ICD-10-CM | POA: Insufficient documentation

## 2017-04-09 DIAGNOSIS — E118 Type 2 diabetes mellitus with unspecified complications: Secondary | ICD-10-CM | POA: Insufficient documentation

## 2017-04-09 DIAGNOSIS — L03115 Cellulitis of right lower limb: Secondary | ICD-10-CM

## 2017-04-09 MED ORDER — CEPHALEXIN 500 MG PO CAPS: 500.0000 mg | ORAL_CAPSULE | Freq: Four times a day (QID) | ORAL | 0 refills | Status: AC

## 2017-04-09 NOTE — Progress Notes (Signed)
Subjective:    Patient ID: Robert [name], male    DOB: [birth year], 72 y.o.   MRN: [id number] Chief Complaint  Patient presents with  . New Patient (Initial Visit)    Cellulitis   Presents as a new patient for evaluation of "recurrent cellulitis".  The patient endorses a long-standing history of "swelling" to the bilateral lower extremity for "years".  The patient notes the swelling usually brings on intermittent bouts of cellulitis which have been treated with p.o. Antibiotics.  Last bout of cellulitis was approximately 3 weeks ago.  The patient notes that the swelling to the bilateral lower extremity worsens throughout the day or with sitting/standing for long periods of time.  The patient denies any recent surgery or trauma to the bilateral lower extremity.  The patient denies any DVT history to the bilateral lower extremity.  The patient does not engage in conservative therapy at this time.  The patient's edema is also associated with some discomfort.  The patient notes his edema and his discomfort has slowly progressed over the years.  The patient notes his right calf is currently "red" and "painful".  The patient denies any fever, nausea vomiting.   Review of Systems  Constitutional: Negative.   HENT: Negative.   Eyes: Negative.   Respiratory: Negative.   Cardiovascular: Positive for leg swelling.  Gastrointestinal: Negative.   Endocrine: Negative.   Genitourinary: Negative.   Musculoskeletal: Negative.   Skin: Positive for color change.  Allergic/Immunologic: Negative.   Neurological: Negative.   Hematological: Negative.   Psychiatric/Behavioral: Negative.       Objective:   Physical Exam  Constitutional: He is oriented to person, place, and time. He appears well-developed and well-nourished. No distress.  HENT:  Head: Normocephalic and atraumatic.  Eyes: Conjunctivae are normal. Pupils are equal, round, and reactive to light.  Neck: Normal range of motion.    Cardiovascular: Normal rate, regular rhythm, normal heart sounds and intact distal pulses.  Pulses:      Radial pulses are 2+ on the right side, and 2+ on the left side.  Hard to palpate pedal pulses due to body habitus and edema  Pulmonary/Chest: Effort normal and breath sounds normal.  Musculoskeletal: Normal range of motion. He exhibits edema (Mild to moderate nonpitting edema noted bilaterally).  Neurological: He is alert and oriented to person, place, and time.  Skin: He is not diaphoretic.  This moderate to severe stasis dermatitis noted to the bilateral calves.  There is the beginning of skin thickening noted to the bilateral calves.  There is cellulitis noted to the right calf.  Skin is intact bilaterally.  Psychiatric: He has a normal mood and affect. His behavior is normal. Judgment and thought content normal.  Vitals reviewed.  BP (!) 159/90 (BP Location: Left Arm, Patient Position: Sitting)   Pulse 74   Resp 17   Ht 6\' 2"  (1.88 m)   Wt (!) 366 lb (166 kg)   BMI 46.99 kg/m   Past Medical History:  Diagnosis Date  . Allergy   . Cellulitis   . Diabetes mellitus without complication ([place])   . Hyperlipidemia   . Hypertension   . Peripheral vascular disease ([place])    Social History   Socioeconomic History  . Marital status: Married    Spouse name: Not on file  . Number of children: Not on file  . Years of education: Not on file  . Highest education level: Not on file  Social Needs  . Financial  resource strain: Not on file  . Food insecurity - worry: Not on file  . Food insecurity - inability: Not on file  . Transportation needs - medical: Not on file  . Transportation needs - non-medical: Not on file  Occupational History  . Not on file  Tobacco Use  . Smoking status: Never Smoker  . Smokeless tobacco: Never Used  Substance and Sexual Activity  . Alcohol use: No  . Drug use: Not on file  . Sexual activity: Not on file  Other Topics Concern  . Not on file   Social History Narrative  . Not on file   Past Surgical History:  Procedure Laterality Date  . HERNIA REPAIR     No family history on file.  No Known Allergies     Assessment & Plan:  Presents as a new patient for evaluation of "recurrent cellulitis".  The patient endorses a long-standing history of "swelling" to the bilateral lower extremity for "years".  The patient notes the swelling usually brings on intermittent bouts of cellulitis which have been treated with p.o. Antibiotics.  Last bout of cellulitis was approximately 3 weeks ago.  The patient notes that the swelling to the bilateral lower extremity worsens throughout the day or with sitting/standing for long periods of time.  The patient denies any recent surgery or trauma to the bilateral lower extremity.  The patient denies any DVT history to the bilateral lower extremity.  The patient does not engage in conservative therapy at this time.  The patient's edema is also associated with some discomfort.  The patient notes his edema and his discomfort has slowly progressed over the years.  The patient notes his right calf is currently "red" and "painful".  The patient denies any fever, nausea vomiting.  1. Cellulitis of right lower extremity - New Patient with mild cellulitis to the right calf Prescribed Keflex 500 mg 1 tab by mouth every 6 hours x 7 days If the patient's erythema should worsen or she should experience any fever, nausea vomiting the patient must call the office or seek medical attention in the emergency department The patient and his wife who was present during this examination expressed their understanding  - VAS [place] LOWER EXTREMITY VENOUS REFLUX; Future  2. Bilateral lower extremity edema - New The patient was encouraged to wear graduated compression stockings (20-30 mmHg) on a daily basis. The patient was instructed to begin wearing the stockings first thing in the morning and removing them in the evening. The  patient was instructed specifically not to sleep in the stockings. Prescription given Prescription was given for both conventional and farrow wrap compression socks In addition, behavioral modification including elevation during the day will be initiated. Anti-inflammatories for pain. The patient will follow up in one months to asses conservative management.  Information on chronic venous insufficiency and compression stockings was given to the patient. The patient was instructed to call the office in the interim if any worsening edema or ulcerations to the legs, feet or toes occurs. The patient expresses their understanding.  - VAS [place] LOWER EXTREMITY VENOUS REFLUX; Future  3. Essential hypertension - Stable Encouraged good control as its slows the progression of atherosclerotic disease  4. Type 2 diabetes mellitus with complication, unspecified whether long term insulin use (HCC) - Stable Encouraged good control as its slows the progression of atherosclerotic disease  5. Hyperlipidemia, unspecified hyperlipidemia type - [place] Encouraged good control as its slows the progression of atherosclerotic disease  Current Outpatient  Medications on File Prior to Visit  Medication Sig Dispense Refill  . amLODipine (NORVASC) 10 MG tablet   4  . aspirin EC 81 MG tablet Take 81 mg by mouth daily.    . cloNIDine (CATAPRES) 0.2 MG tablet   5  . clopidogrel (PLAVIX) 75 MG tablet   2  . DULoxetine (CYMBALTA) 30 MG capsule   3  . furosemide (LASIX) 40 MG tablet   2  . hydrochlorothiazide (HYDRODIURIL) 25 MG tablet   5  . HYDROcodone-acetaminophen (NORCO) 5-325 MG tablet Take 1 tablet by mouth every 6 (six) hours as needed for moderate pain. 15 tablet 0  . JANUVIA 100 MG tablet   2  . LANTUS SOLOSTAR 100 UNIT/ML Solostar Pen   2  . Latanoprost-Timolol Maleate 0.005-0.5 % SOLN INSTILL ONE DROP TO BOTH EYES AT BEDTIME    . lisinopril (PRINIVIL,ZESTRIL) 5 MG tablet   4  . loratadine (CLARITIN) 10 MG  tablet Take 10 mg by mouth daily as needed for allergies.    Marland Kitchen meclizine (ANTIVERT) 12.5 MG tablet   0  . metFORMIN (GLUCOPHAGE) 1000 MG tablet   2  . metoprolol succinate (TOPROL-XL) 100 MG 24 hr tablet   1  . potassium chloride (K-DUR,KLOR-CON) 10 MEQ tablet   3  . simvastatin (ZOCOR) 20 MG tablet   3  . sulfamethoxazole-trimethoprim (BACTRIM DS,SEPTRA DS) 800-160 MG tablet Take 1 tablet by mouth 2 (two) times daily. X 7 days 14 tablet 0   No current facility-administered medications on file prior to visit.    There are no Patient Instructions on file for this visit. No Follow-up on file.  Lealand Elting A Lyndle Pang, PA-C

## 2017-05-09 ENCOUNTER — Ambulatory Visit (INDEPENDENT_AMBULATORY_CARE_PROVIDER_SITE_OTHER): Payer: Medicare Other | Admitting: Vascular Surgery

## 2017-05-09 ENCOUNTER — Ambulatory Visit (INDEPENDENT_AMBULATORY_CARE_PROVIDER_SITE_OTHER): Payer: Medicare Other

## 2017-05-09 ENCOUNTER — Encounter (INDEPENDENT_AMBULATORY_CARE_PROVIDER_SITE_OTHER): Payer: Self-pay | Admitting: Vascular Surgery

## 2017-05-09 VITALS — BP 136/76 | HR 74 | Resp 18 | Wt 367.0 lb

## 2017-05-09 DIAGNOSIS — L03115 Cellulitis of right lower limb: Secondary | ICD-10-CM | POA: Diagnosis not present

## 2017-05-09 DIAGNOSIS — I890 Lymphedema, not elsewhere classified: Secondary | ICD-10-CM

## 2017-05-09 DIAGNOSIS — I872 Venous insufficiency (chronic) (peripheral): Secondary | ICD-10-CM | POA: Diagnosis not present

## 2017-05-09 DIAGNOSIS — R600 Localized edema: Secondary | ICD-10-CM | POA: Diagnosis not present

## 2017-05-09 DIAGNOSIS — E118 Type 2 diabetes mellitus with unspecified complications: Secondary | ICD-10-CM

## 2017-05-09 DIAGNOSIS — E785 Hyperlipidemia, unspecified: Secondary | ICD-10-CM | POA: Diagnosis not present

## 2017-05-09 NOTE — Progress Notes (Signed)
Subjective:    Patient ID: Robert [name], male    DOB: 06-16-1945, 72 y.o.   MRN: [id number] Chief Complaint  Patient presents with  . Follow-up    bil ven ultrasound   Patient presents for a monthly follow-up.  Since his initial visit, the patient has been engaging in conservative therapy including wearing medical grade one compression stockings, elevating his legs and remaining active with minimal improvement to his bilateral lower extremity edema.  The patient's cellulitis has resolved.  The patient's edema has progressed to the point that he is unable to function on a daily basis.  The patient feels that his symptoms are lifestyle limiting.  The patient underwent a bilateral lower extremity venous reflux study which was notable for abnormal reflux times noted in the right common femoral, right femoral vein and right great saphenous vein at the proximal thigh.  Abnormal reflux times were noted to the left femoral vein and left great saphenous vein.  The patient denies any fever, nausea vomiting.   Review of Systems  Constitutional: Negative.   HENT: Negative.   Eyes: Negative.   Respiratory: Negative.   Cardiovascular: Positive for leg swelling.       Chronic venous insufficiency Lymphedema  Gastrointestinal: Negative.   Endocrine: Negative.   Genitourinary: Negative.   Musculoskeletal: Negative.   Skin: Negative.   Allergic/Immunologic: Negative.   Neurological: Negative.   Hematological: Negative.   Psychiatric/Behavioral: Negative.       Objective:   Physical Exam  Constitutional: He is oriented to person, place, and time. He appears well-developed and well-nourished. No distress.  HENT:  Head: Normocephalic and atraumatic.  Eyes: Conjunctivae are normal. Pupils are equal, round, and reactive to light.  Neck: Normal range of motion.  Cardiovascular: Normal rate, regular rhythm, normal heart sounds and intact distal pulses.  Pulses:      Radial pulses are 2+ on the  right side, and 2+ on the left side.  Hard to palpate pedal pulses however the bilateral feet are warm  Pulmonary/Chest: Effort normal and breath sounds normal.  Musculoskeletal: Normal range of motion. He exhibits edema (Bilateral moderate lower extremity edema).  Neurological: He is alert and oriented to person, place, and time.  Skin: He is not diaphoretic.  There is moderate stasis dermatitis noted.  There is the beginning of skin thickening noted.  Cellulitis has resolved.  There are no ulcerations noted at this time.  Psychiatric: He has a normal mood and affect. His behavior is normal. Judgment and thought content normal.  Vitals reviewed.  BP 136/76 (BP Location: Right Arm)   Pulse 74   Resp 18   Wt (!) 367 lb (166.5 kg)   BMI 47.12 kg/m   Past Medical History:  Diagnosis Date  . Allergy   . Cellulitis   . Diabetes mellitus without complication ([place])   . Hyperlipidemia   . Hypertension   . Peripheral vascular disease ([place])    Social History   Socioeconomic History  . Marital status: Married    Spouse name: Not on file  . Number of children: Not on file  . Years of education: Not on file  . Highest education level: Not on file  Social Needs  . Financial resource strain: Not on file  . Food insecurity - worry: Not on file  . Food insecurity - inability: Not on file  . Transportation needs - medical: Not on file  . Transportation needs - non-medical: Not on file  Occupational History  .  Not on file  Tobacco Use  . Smoking status: Never Smoker  . Smokeless tobacco: Never Used  Substance and Sexual Activity  . Alcohol use: No  . Drug use: No  . Sexual activity: Not on file  Other Topics Concern  . Not on file  Social History Narrative  . Not on file   Past Surgical History:  Procedure Laterality Date  . HERNIA REPAIR     Family History  Problem Relation Age of Onset  . Heart disease Mother   . Stroke Mother   . Heart disease Father   . Heart attack  Father    No Known Allergies     Assessment & Plan:  Patient presents for a monthly follow-up.  Since his initial visit, the patient has been engaging in conservative therapy including wearing medical grade one compression stockings, elevating his legs and remaining active with minimal improvement to his bilateral lower extremity edema.  The patient's cellulitis has resolved.  The patient's edema has progressed to the point that he is unable to function on a daily basis.  The patient feels that his symptoms are lifestyle limiting.  The patient underwent a bilateral lower extremity venous reflux study which was notable for abnormal reflux times noted in the right common femoral, right femoral vein and right great saphenous vein at the proximal thigh.  Abnormal reflux times were noted to the left femoral vein and left great saphenous vein.  The patient denies any fever, nausea vomiting.  1. Chronic venous insufficiency - New Despite conservative treatments including exercise, elevation and class I compression stockings the patient still presents with stage II lymphedema The patient would greatly benefit from the added therapy of a lymphedema pump I will applied to the patient's insurance I will bring the patient back in 3 months to assess his progress with conservative therapy and lymphedema pump.  If there is no progress the patient may be a candidate for saphenous vein laser ablation bilaterally.  2. Lymphedema - New As above  3. Type 2 diabetes mellitus with complication, unspecified whether long term insulin use (HCC) - Stable Encouraged good control as its slows the progression of atherosclerotic disease  4. Hyperlipidemia, unspecified hyperlipidemia type - Stable Encouraged good control as its slows the progression of atherosclerotic disease  Current Outpatient Medications on File Prior to Visit  Medication Sig Dispense Refill  . amLODipine (NORVASC) 10 MG tablet   4  . aspirin EC 81 MG  tablet Take 81 mg by mouth daily.    . cloNIDine (CATAPRES) 0.2 MG tablet   5  . clopidogrel (PLAVIX) 75 MG tablet   2  . DULoxetine (CYMBALTA) 30 MG capsule   3  . furosemide (LASIX) 40 MG tablet   2  . hydrochlorothiazide (HYDRODIURIL) 25 MG tablet   5  . JANUVIA 100 MG tablet   2  . LANTUS SOLOSTAR 100 UNIT/ML Solostar Pen   2  . Latanoprost-Timolol Maleate 0.005-0.5 % SOLN INSTILL ONE DROP TO BOTH EYES AT BEDTIME    . lisinopril (PRINIVIL,ZESTRIL) 5 MG tablet   4  . loratadine (CLARITIN) 10 MG tablet Take 10 mg by mouth daily as needed for allergies.    Marland Kitchen meclizine (ANTIVERT) 12.5 MG tablet   0  . metFORMIN (GLUCOPHAGE) 1000 MG tablet   2  . metoprolol succinate (TOPROL-XL) 100 MG 24 hr tablet   1  . potassium chloride (K-DUR,KLOR-CON) 10 MEQ tablet   3  . simvastatin (ZOCOR) 20 MG tablet  3  . HYDROcodone-acetaminophen (NORCO) 5-325 MG tablet Take 1 tablet by mouth every 6 (six) hours as needed for moderate pain. (Patient not taking: Reported on 05/09/2017) 15 tablet 0  . sulfamethoxazole-trimethoprim (BACTRIM DS,SEPTRA DS) 800-160 MG tablet Take 1 tablet by mouth 2 (two) times daily. X 7 days (Patient not taking: Reported on 05/09/2017) 14 tablet 0   No current facility-administered medications on file prior to visit.    There are no Patient Instructions on file for this visit. No Follow-up on file.  Arfa Lamarca A Niclas Markell, PA-C

## 2017-06-12 DIAGNOSIS — I890 Lymphedema, not elsewhere classified: Secondary | ICD-10-CM | POA: Diagnosis not present

## 2017-08-08 DIAGNOSIS — I890 Lymphedema, not elsewhere classified: Secondary | ICD-10-CM | POA: Diagnosis not present

## 2017-08-13 ENCOUNTER — Ambulatory Visit (INDEPENDENT_AMBULATORY_CARE_PROVIDER_SITE_OTHER): Payer: Medicare Other | Admitting: Vascular Surgery

## 2017-08-22 DIAGNOSIS — Z0000 Encounter for general adult medical examination without abnormal findings: Secondary | ICD-10-CM | POA: Diagnosis not present

## 2017-09-12 DIAGNOSIS — E119 Type 2 diabetes mellitus without complications: Secondary | ICD-10-CM | POA: Diagnosis not present

## 2017-09-14 ENCOUNTER — Telehealth: Payer: Self-pay | Admitting: Gastroenterology

## 2017-09-14 ENCOUNTER — Other Ambulatory Visit: Payer: Self-pay

## 2017-09-14 DIAGNOSIS — Z1211 Encounter for screening for malignant neoplasm of colon: Secondary | ICD-10-CM

## 2017-09-14 MED ORDER — NA SULFATE-K SULFATE-MG SULF 17.5-3.13-1.6 GM/177ML PO SOLN: 1.0000 | Freq: Once | ORAL | 0 refills | Status: AC

## 2017-09-14 NOTE — Telephone Encounter (Signed)
Gastroenterology Pre-Procedure Review  Request Date: 09/26/17   [facility] Requesting Physician: Dr. Vicente Males  PATIENT REVIEW QUESTIONS: The patient responded to the following health history questions as indicated:    1. Are you having any GI issues? no 2. Do you have a personal history of Polyps? no 3. Do you have a family history of Colon Cancer or Polyps? no 4. Diabetes Mellitus? yes (Type II) 5. Joint replacements in the past 12 months?no 6. Major health problems in the past 3 months?no 7. Any artificial heart valves, MVP, or defibrillator?no    MEDICATIONS & ALLERGIES:    Patient reports the following regarding taking any anticoagulation/antiplatelet therapy:   Plavix, Coumadin, Eliquis, Xarelto, Lovenox, Pradaxa, Brilinta, or Effient? no Aspirin? yes (81 mg)  Patient confirms/reports the following medications:  Current Outpatient Medications  Medication Sig Dispense Refill  . amLODipine (NORVASC) 10 MG tablet   4  . aspirin EC 81 MG tablet Take 81 mg by mouth daily.    . cloNIDine (CATAPRES) 0.2 MG tablet   5  . clopidogrel (PLAVIX) 75 MG tablet   2  . DULoxetine (CYMBALTA) 30 MG capsule   3  . furosemide (LASIX) 40 MG tablet   2  . hydrochlorothiazide (HYDRODIURIL) 25 MG tablet   5  . HYDROcodone-acetaminophen (NORCO) 5-325 MG tablet Take 1 tablet by mouth every 6 (six) hours as needed for moderate pain. (Patient not taking: Reported on 05/09/2017) 15 tablet 0  . JANUVIA 100 MG tablet   2  . LANTUS SOLOSTAR 100 UNIT/ML Solostar Pen   2  . Latanoprost-Timolol Maleate 0.005-0.5 % SOLN INSTILL ONE DROP TO BOTH EYES AT BEDTIME    . lisinopril (PRINIVIL,ZESTRIL) 5 MG tablet   4  . loratadine (CLARITIN) 10 MG tablet Take 10 mg by mouth daily as needed for allergies.    Marland Kitchen meclizine (ANTIVERT) 12.5 MG tablet   0  . metFORMIN (GLUCOPHAGE) 1000 MG tablet   2  . metoprolol succinate (TOPROL-XL) 100 MG 24 hr tablet   1  . potassium chloride (K-DUR,KLOR-CON) 10 MEQ tablet   3  . simvastatin  (ZOCOR) 20 MG tablet   3  . sulfamethoxazole-trimethoprim (BACTRIM DS,SEPTRA DS) 800-160 MG tablet Take 1 tablet by mouth 2 (two) times daily. X 7 days (Patient not taking: Reported on 05/09/2017) 14 tablet 0   No current facility-administered medications for this visit.     Patient confirms/reports the following allergies:  No Known Allergies  No orders of the defined types were placed in this encounter.   AUTHORIZATION INFORMATION Primary Insurance: 1D#: Group #:  Secondary Insurance: 1D#: Group #:  SCHEDULE INFORMATION: Date: 09/26/17   Vicente Males Time: Location: [place]

## 2017-09-17 ENCOUNTER — Other Ambulatory Visit: Payer: Self-pay

## 2017-09-17 ENCOUNTER — Telehealth: Payer: Self-pay

## 2017-09-17 MED ORDER — PEG 3350-KCL-NABCB-NACL-NASULF 236 G PO SOLR: 4000.0000 mL | Freq: Once | ORAL | 0 refills | Status: AC

## 2017-09-17 NOTE — Telephone Encounter (Signed)
LVM for pt to contact office to reschedule his colonoscopy date from 09/26/17 with Dr. Vicente Males to possibly 09/27/17.  Waiting for call back.  Thanks [employer]

## 2017-09-21 ENCOUNTER — Telehealth: Payer: Self-pay

## 2017-09-21 ENCOUNTER — Other Ambulatory Visit: Payer: Self-pay

## 2017-09-21 NOTE — Telephone Encounter (Signed)
Patient stated that he received my message to reschedule his colonoscopy to 09/27/17 and is fine with that.  A blood thinner request will be sent to Dr. Jennette Kettle office electronically as the patient has an appt on Monday with him.  Thanks [employer]

## 2017-09-21 NOTE — Progress Notes (Signed)
Rx has been changed from [place] to [facility] with [employer].  Thanks [employer]

## 2017-09-24 NOTE — Telephone Encounter (Signed)
Patient has been informed per Dr. Lavera Guise to stop blood thinner 5 days prior to colonoscopy.  This made [place] unable to do colonoscopy as originally planned for 08/08.  His new date is 10/02/17 due to blood thinner stop time.  He has been asked to resume blood thinner 5 days after his colonoscopy.  Patient expressed understanding and will call office if he needs to reschedule after he checks with his wife.  Thanks [employer]

## 2017-09-24 NOTE — Telephone Encounter (Signed)
Patient called and left voicemail [phone] @4 :39PM to ask question about prep for procedure. Called patient and was sent to vm. Left message to call back if he still has questions.

## 2017-10-01 ENCOUNTER — Encounter: Payer: Self-pay | Admitting: *Deleted

## 2017-10-02 ENCOUNTER — Encounter
Admission: RE | Disposition: A | Discharge status: Home / Self Care | Payer: Self-pay | Source: Ambulatory Visit | Attending: Gastroenterology

## 2017-10-02 ENCOUNTER — Ambulatory Visit: Payer: Medicare Other | Admitting: Anesthesiology

## 2017-10-02 ENCOUNTER — Encounter: Payer: Self-pay | Admitting: Anesthesiology

## 2017-10-02 ENCOUNTER — Ambulatory Visit
Admission: RE | Admit: 2017-10-02 | Discharge: 2017-10-02 | Disposition: A | Discharge status: Home / Self Care | Payer: Medicare Other | Source: Ambulatory Visit | Attending: Gastroenterology | Admitting: Gastroenterology

## 2017-10-02 DIAGNOSIS — Z7982 Long term (current) use of aspirin: Secondary | ICD-10-CM | POA: Insufficient documentation

## 2017-10-02 DIAGNOSIS — D122 Benign neoplasm of ascending colon: Secondary | ICD-10-CM

## 2017-10-02 DIAGNOSIS — K635 Polyp of colon: Secondary | ICD-10-CM | POA: Diagnosis not present

## 2017-10-02 DIAGNOSIS — Z7902 Long term (current) use of antithrombotics/antiplatelets: Secondary | ICD-10-CM | POA: Insufficient documentation

## 2017-10-02 DIAGNOSIS — D124 Benign neoplasm of descending colon: Secondary | ICD-10-CM | POA: Diagnosis not present

## 2017-10-02 DIAGNOSIS — Z1211 Encounter for screening for malignant neoplasm of colon: Secondary | ICD-10-CM | POA: Diagnosis not present

## 2017-10-02 DIAGNOSIS — K5140 Inflammatory polyps of colon without complications: Secondary | ICD-10-CM | POA: Insufficient documentation

## 2017-10-02 DIAGNOSIS — Z794 Long term (current) use of insulin: Secondary | ICD-10-CM | POA: Diagnosis not present

## 2017-10-02 DIAGNOSIS — K621 Rectal polyp: Secondary | ICD-10-CM | POA: Diagnosis not present

## 2017-10-02 DIAGNOSIS — E1151 Type 2 diabetes mellitus with diabetic peripheral angiopathy without gangrene: Secondary | ICD-10-CM | POA: Diagnosis not present

## 2017-10-02 DIAGNOSIS — E785 Hyperlipidemia, unspecified: Secondary | ICD-10-CM | POA: Diagnosis not present

## 2017-10-02 DIAGNOSIS — Z79899 Other long term (current) drug therapy: Secondary | ICD-10-CM | POA: Diagnosis not present

## 2017-10-02 DIAGNOSIS — I10 Essential (primary) hypertension: Secondary | ICD-10-CM | POA: Diagnosis not present

## 2017-10-02 DIAGNOSIS — G4730 Sleep apnea, unspecified: Secondary | ICD-10-CM | POA: Diagnosis not present

## 2017-10-02 DIAGNOSIS — Z6841 Body Mass Index (BMI) 40.0 and over, adult: Secondary | ICD-10-CM | POA: Insufficient documentation

## 2017-10-02 DIAGNOSIS — D125 Benign neoplasm of sigmoid colon: Secondary | ICD-10-CM | POA: Diagnosis not present

## 2017-10-02 DIAGNOSIS — D126 Benign neoplasm of colon, unspecified: Secondary | ICD-10-CM | POA: Diagnosis not present

## 2017-10-02 HISTORY — PX: COLONOSCOPY WITH PROPOFOL: SHX5780

## 2017-10-02 LAB — GLUCOSE, CAPILLARY: GLUCOSE-CAPILLARY: 141 mg/dL — AB (ref 70–99)

## 2017-10-02 SURGERY — COLONOSCOPY WITH PROPOFOL
Anesthesia: General

## 2017-10-02 MED ORDER — MIDAZOLAM HCL 2 MG/2ML IJ SOLN
INTRAMUSCULAR | Status: AC
  Filled 2017-10-02: qty 2

## 2017-10-02 MED ORDER — FENTANYL CITRATE (PF) 100 MCG/2ML IJ SOLN
INTRAMUSCULAR | Status: AC
  Filled 2017-10-02: qty 2

## 2017-10-02 MED ORDER — SODIUM CHLORIDE 0.9 % IV SOLN
INTRAVENOUS | Status: DC
  Administered 2017-10-02: 1000 mL via INTRAVENOUS

## 2017-10-02 MED ORDER — PROPOFOL 500 MG/50ML IV EMUL
INTRAVENOUS | Status: DC | PRN
  Administered 2017-10-02: 140 ug/kg/min via INTRAVENOUS

## 2017-10-02 MED ORDER — EPHEDRINE SULFATE 50 MG/ML IJ SOLN
INTRAMUSCULAR | Status: AC
  Filled 2017-10-02: qty 1

## 2017-10-02 MED ORDER — EPHEDRINE SULFATE 50 MG/ML IJ SOLN
INTRAMUSCULAR | Status: DC | PRN
  Administered 2017-10-02 (×4): 5 mg via INTRAVENOUS

## 2017-10-02 MED ORDER — PROPOFOL 500 MG/50ML IV EMUL
INTRAVENOUS | Status: AC
  Filled 2017-10-02: qty 50

## 2017-10-02 MED ORDER — FENTANYL CITRATE (PF) 100 MCG/2ML IJ SOLN
INTRAMUSCULAR | Status: DC | PRN
  Administered 2017-10-02: 50 ug via INTRAVENOUS

## 2017-10-02 MED ORDER — LIDOCAINE HCL (PF) 1 % IJ SOLN
2.0000 mL | Freq: Once | INTRAMUSCULAR | Status: AC
  Administered 2017-10-02: 0.3 mL via INTRADERMAL

## 2017-10-02 MED ORDER — PHENYLEPHRINE HCL 10 MG/ML IJ SOLN
INTRAMUSCULAR | Status: DC | PRN
  Administered 2017-10-02 (×2): 50 ug via INTRAVENOUS

## 2017-10-02 MED ORDER — MIDAZOLAM HCL 2 MG/2ML IJ SOLN
INTRAMUSCULAR | Status: DC | PRN
  Administered 2017-10-02: 2 mg via INTRAVENOUS

## 2017-10-02 MED ORDER — LIDOCAINE HCL (PF) 1 % IJ SOLN
INTRAMUSCULAR | Status: AC
  Administered 2017-10-02: 0.3 mL via INTRADERMAL
  Filled 2017-10-02: qty 2

## 2017-10-02 MED ORDER — PHENYLEPHRINE HCL 10 MG/ML IJ SOLN
INTRAMUSCULAR | Status: AC
  Filled 2017-10-02: qty 1

## 2017-10-02 NOTE — Anesthesia Post-op Follow-up Note (Signed)
Anesthesia QCDR form completed.        

## 2017-10-02 NOTE — Anesthesia Procedure Notes (Signed)
Performed by: Vaughan Sine Pre-anesthesia Checklist: Emergency Drugs available, Patient identified, Suction available, Patient being monitored and Timeout performed Patient Re-evaluated:Patient Re-evaluated prior to induction Oxygen Delivery Method: Simple face mask Preoxygenation: Pre-oxygenation with 100% oxygen Induction Type: IV induction Ventilation: Oral airway inserted - appropriate to patient size and Nasal airway inserted- appropriate to patient size Placement Confirmation: positive ETCO2 and CO2 detector

## 2017-10-02 NOTE — Op Note (Signed)
[facility] Gastroenterology Patient Name: Robert [name] Procedure Date: 10/02/2017 12:35 PM MRN: [id number] Account #: [id number] Date of Birth: 1945-05-04 Admit Type: Outpatient Age: 72 Room: [facility] ENDO ROOM 1 Gender: Male Note Status: Finalized Procedure:            Colonoscopy Indications:          Screening for colorectal malignant neoplasm Providers:            Jonathon Bellows MD, MD Referring MD:         Cletis Athens, MD (Referring MD) Medicines:            Monitored Anesthesia Care Complications:        No immediate complications. Procedure:            Pre-Anesthesia Assessment:                       - Prior to the procedure, a History and Physical was                        performed, and patient medications, allergies and                        sensitivities were reviewed. The patient's tolerance of                        previous anesthesia was reviewed.                       - ASA Grade Assessment: III - A patient with severe                        systemic disease.                       After obtaining informed consent, the colonoscope was                        passed under direct vision. Throughout the procedure,                        the patient's blood pressure, pulse, and oxygen                        saturations were monitored continuously. The                        Colonoscope was introduced through the anus and                        advanced to the the cecum, identified by the                        appendiceal orifice, IC valve and transillumination.                        The colonoscopy was performed with ease. The patient                        tolerated the procedure well. The quality of the bowel  preparation was poor. Findings:      The perianal and digital rectal examinations were normal.      Two sessile polyps were found in the ascending colon. The polyps were 5       to 8 mm in size. These polyps were  removed with a cold snare. Resection       and retrieval were complete.      A 6 mm polyp was found in the descending colon. The polyp was sessile.       The polyp was removed with a cold snare. Resection and retrieval were       complete.      A large polyp was found in the cecum. The polyp was flat. The polyp was       flat and over a fold, could not get a good pisition to attempt resection.      A 20 mm polyp was found in the sigmoid colon. The polyp was sessile.       large size over a fold, requires large EMR or ESD, did not attempt       resection or take biopsies Area was tattooed with an injection of 5 mL       of Spot (carbon black). placed distally and proximally      The exam was otherwise without abnormality on direct and retroflexion       views.      Multiple small-mouthed diverticula were found in the sigmoid colon. Impression:           - Two 5 to 8 mm polyps in the ascending colon, removed                        with a cold snare. Resected and retrieved.                       - One 6 mm polyp in the descending colon, removed with                        a cold snare. Resected and retrieved.                       - One large polyp in the cecum.                       - One 20 mm polyp in the sigmoid colon. Tattooed.                       - The examination was otherwise normal on direct and                        retroflexion views. Recommendation:       - Discharge patient to home (with escort).                       - Resume previous diet.                       - Continue present medications.                       - Await pathology results.                       -  Refer to [facility] for resection of large polyps in the                        sigmoid and cecum . He had a poor prep and will need a                        two day prep Procedure Code(s):    --- Professional ---                       [phone], Colonoscopy, flexible; with removal of tumor(s),                         polyp(s), or other lesion(s) by snare technique                       [id number], Colonoscopy, flexible; with directed submucosal                        injection(s), any substance CPT copyright 2017 American Medical Association. All rights reserved. The codes documented in this report are preliminary and upon coder review may  be revised to meet current compliance requirements. Jonathon Bellows, MD Jonathon Bellows MD, MD 10/02/2017 1:10:37 PM This report has been signed electronically. Number of Addenda: 0 Note Initiated On: 10/02/2017 12:35 PM Scope Withdrawal Time: 0 hours 8 minutes 40 seconds  Total Procedure Duration: 0 hours 20 minutes 37 seconds       [facility]

## 2017-10-02 NOTE — Transfer of Care (Signed)
Immediate Anesthesia Transfer of Care Note  Patient: Robert [name]  Procedure(s) Performed: COLONOSCOPY WITH PROPOFOL (N/A )  Patient Location: PACU  Anesthesia Type:General  Level of Consciousness: awake and sedated  Airway & Oxygen Therapy: Patient Spontanous Breathing and Patient connected to face mask oxygen  Post-op Assessment: Report given to RN and Post -op Vital signs reviewed and stable  Post vital signs: Reviewed and stable  Last Vitals:  Vitals Value Taken Time  BP 142/88 10/02/2017  1:12 PM  Temp    Pulse 73 10/02/2017  1:12 PM  Resp 16 10/02/2017  1:12 PM  SpO2 100 % 10/02/2017  1:12 PM  Vitals shown include unvalidated device data.  Last Pain:  Vitals:   10/02/17 1139  TempSrc: Tympanic  PainSc: 0-No pain         Complications: No apparent anesthesia complications

## 2017-10-02 NOTE — H&P (Signed)
Jonathon Bellows, MD [address], [place], [place], [place], [address] [address], [place], [place], [place], [address] Phone: [phone]  Fax: [phone]  Primary Care Physician:  Cletis Athens, MD   Pre-Procedure History & Physical: HPI:  Robert [name] is a 72 y.o. male is here for an colonoscopy.   Past Medical History:  Diagnosis Date  . Allergy   . Cellulitis   . Diabetes mellitus without complication ([place])   . Hyperlipidemia   . Hypertension   . Peripheral vascular disease [facility])     Past Surgical History:  Procedure Laterality Date  . HERNIA REPAIR      Prior to Admission medications   Medication Sig Start Date End Date Taking? Authorizing Provider  amLODipine (NORVASC) 10 MG tablet  02/07/17   [provider]  aspirin EC 81 MG tablet Take 81 mg by mouth daily.    [provider]  cloNIDine (CATAPRES) 0.2 MG tablet  03/20/17   [provider]  clopidogrel (PLAVIX) 75 MG tablet  02/19/17   [provider]  DULoxetine (CYMBALTA) 30 MG capsule  02/27/17   [provider]  furosemide (LASIX) 40 MG tablet  03/26/17   [provider]  hydrochlorothiazide (HYDRODIURIL) 25 MG tablet  02/09/17   [provider]  HYDROcodone-acetaminophen (NORCO) 5-325 MG tablet Take 1 tablet by mouth every 6 (six) hours as needed for moderate pain. Patient not taking: Reported on 05/09/2017 08/21/15   Renata Caprice  JANUVIA 100 MG tablet  03/12/17   [provider]  LANTUS SOLOSTAR 100 UNIT/ML Solostar Pen  03/28/17   [provider]  Latanoprost-Timolol Maleate 0.005-0.5 % SOLN INSTILL ONE DROP TO BOTH EYES AT BEDTIME 07/06/15   [provider]  lisinopril (PRINIVIL,ZESTRIL) 5 MG tablet  03/07/17   [provider]  loratadine (CLARITIN) 10 MG tablet Take 10 mg by mouth daily as needed for allergies.    [provider]  meclizine (ANTIVERT) 12.5 MG tablet  03/06/17    [provider]  metFORMIN (GLUCOPHAGE) 1000 MG tablet  02/19/17   [provider]  metoprolol succinate (TOPROL-XL) 100 MG 24 hr tablet  02/21/17   [provider]  potassium chloride (K-DUR,KLOR-CON) 10 MEQ tablet  02/01/17   [provider]  simvastatin (ZOCOR) 20 MG tablet  04/06/17   [provider]  sulfamethoxazole-trimethoprim (BACTRIM DS,SEPTRA DS) 800-160 MG tablet Take 1 tablet by mouth 2 (two) times daily. X 7 days Patient not taking: Reported on 05/09/2017 08/21/15   Duanne Guess, PA-C    Allergies as of 09/14/2017  . (No Known Allergies)    Family History  Problem Relation Age of Onset  . Heart disease Mother   . Stroke Mother   . Heart disease Father   . Heart attack Father     Social History   Socioeconomic History  . Marital status: Married    Spouse name: Not on file  . Number of children: Not on file  . Years of education: Not on file  . Highest education level: Not on file  Occupational History  . Not on file  Social Needs  . Financial resource strain: Not on file  . Food insecurity:    Worry: Not on file    Inability: Not on file  . Transportation needs:    Medical: Not on file    Non-medical: Not on file  Tobacco Use  . Smoking status: Never Smoker  .  Smokeless tobacco: Never Used  Substance and Sexual Activity  . Alcohol use: No  . Drug use: No  . Sexual activity: Not on file  Lifestyle  . Physical activity:    Days per week: Not on file    Minutes per session: Not on file  . Stress: Not on file  Relationships  . Social connections:    Talks on phone: Not on file    Gets together: Not on file    Attends religious service: Not on file    Active member of club or organization: Not on file    Attends meetings of clubs or organizations: Not on file    Relationship status: Not on file  . Intimate partner violence:    Fear of current or ex partner: Not on file    Emotionally abused: Not on file     Physically abused: Not on file    Forced sexual activity: Not on file  Other Topics Concern  . Not on file  Social History Narrative  . Not on file    Review of Systems: See HPI, otherwise negative ROS  Physical Exam: BP (!) 171/89   Pulse 64   Temp (!) 96.5 F (35.8 C) (Tympanic)   Resp 17   Ht 6\' 2"  (1.88 m)   Wt (!) 158.8 kg   SpO2 99%   BMI 44.94 kg/m  General:   Alert,  pleasant and cooperative in NAD Head:  Normocephalic and atraumatic. Neck:  Supple; no masses or thyromegaly. Lungs:  Clear throughout to auscultation, normal respiratory effort.    Heart:  +S1, +S2, Regular rate and rhythm, No edema. Abdomen:  Soft, nontender and nondistended. Normal bowel sounds, without guarding, and without rebound.   Neurologic:  Alert and  oriented x4;  grossly normal neurologically.  Impression/Plan: Robert [name] is here for an colonoscopy to be performed for Screening colonoscopy average risk   Risks, benefits, limitations, and alternatives regarding  colonoscopy have been reviewed with the patient.  Questions have been answered.  All parties agreeable.   Jonathon Bellows, MD  10/02/2017, 12:12 PM

## 2017-10-02 NOTE — Anesthesia Preprocedure Evaluation (Signed)
Anesthesia Evaluation  Patient identified by MRN, date of birth, ID band Patient awake    Reviewed: Allergy & Precautions, H&P , NPO status , Patient's Chart, lab work & pertinent test results, reviewed documented beta blocker date and time   History of Anesthesia Complications Negative for: history of anesthetic complications  Airway Mallampati: III  TM Distance: >3 FB Neck ROM: full    Dental  (+) Dental Advidsory Given, Missing, Poor Dentition   Pulmonary neg shortness of breath, sleep apnea (likely based on history) , neg COPD, neg recent URI,           Cardiovascular Exercise Tolerance: Good hypertension, (-) angina(-) CAD, (-) Past MI, (-) Cardiac Stents and (-) CABG (-) dysrhythmias (-) Valvular Problems/Murmurs     Neuro/Psych negative neurological ROS  negative psych ROS   GI/Hepatic negative GI ROS, Neg liver ROS,   Endo/Other  diabetes, Insulin DependentMorbid obesity  Renal/GU negative Renal ROS  negative genitourinary   Musculoskeletal   Abdominal   Peds  Hematology negative hematology ROS (+)   Anesthesia Other Findings Past Medical History: No date: Allergy No date: Cellulitis No date: Diabetes mellitus without complication (HCC) No date: Hyperlipidemia No date: Hypertension No date: Peripheral vascular disease (HCC)   Reproductive/Obstetrics negative OB ROS                             Anesthesia Physical Anesthesia Plan  ASA: III  Anesthesia Plan: General   Post-op Pain Management:    Induction: Intravenous  PONV Risk Score and Plan: 2 and Propofol infusion and TIVA  Airway Management Planned: Nasal Cannula  Additional Equipment:   Intra-op Plan:   Post-operative Plan:   Informed Consent: I have reviewed the patients History and Physical, chart, labs and discussed the procedure including the risks, benefits and alternatives for the proposed anesthesia  with the patient or authorized representative who has indicated his/her understanding and acceptance.   Dental Advisory Given  Plan Discussed with: Anesthesiologist, CRNA and Surgeon  Anesthesia Plan Comments:         Anesthesia Quick Evaluation

## 2017-10-03 NOTE — Anesthesia Postprocedure Evaluation (Signed)
Anesthesia Post Note  Patient: Robert [name]  Procedure(s) Performed: COLONOSCOPY WITH PROPOFOL (N/A )  Patient location during evaluation: Endoscopy Anesthesia Type: General Level of consciousness: awake and alert Pain management: pain level controlled Vital Signs Assessment: post-procedure vital signs reviewed and stable Respiratory status: spontaneous breathing, nonlabored ventilation, respiratory function stable and patient connected to nasal cannula oxygen Cardiovascular status: blood pressure returned to baseline and stable Postop Assessment: no apparent nausea or vomiting Anesthetic complications: no     Last Vitals:  Vitals:   10/02/17 1330 10/02/17 1340  BP: (!) 167/96 (!) 161/96  Pulse: 62 61  Resp: 16 15  Temp:    SpO2: 98% 95%    Last Pain:  Vitals:   10/02/17 1310  TempSrc: Tympanic  PainSc:                  Martha Clan

## 2017-10-05 DIAGNOSIS — N182 Chronic kidney disease, stage 2 (mild): Secondary | ICD-10-CM | POA: Diagnosis not present

## 2017-10-05 DIAGNOSIS — G4733 Obstructive sleep apnea (adult) (pediatric): Secondary | ICD-10-CM | POA: Diagnosis not present

## 2017-10-05 DIAGNOSIS — E118 Type 2 diabetes mellitus with unspecified complications: Secondary | ICD-10-CM | POA: Diagnosis not present

## 2017-10-05 DIAGNOSIS — E669 Obesity, unspecified: Secondary | ICD-10-CM | POA: Diagnosis not present

## 2017-10-05 LAB — SURGICAL PATHOLOGY

## 2017-10-08 ENCOUNTER — Encounter: Payer: Self-pay | Admitting: Gastroenterology

## 2017-10-08 ENCOUNTER — Other Ambulatory Visit: Payer: Self-pay

## 2017-10-18 ENCOUNTER — Telehealth: Payer: Self-pay

## 2017-10-18 NOTE — Telephone Encounter (Signed)
Called pt to inform him that [facility] has been unable to contact him to schedule appointment also informed pt of biopsy results. Pt states he has been out of town on vacation and will contact [facility] when he returns at the beginning of next week.

## 2017-12-03 ENCOUNTER — Telehealth: Payer: Self-pay

## 2017-12-03 NOTE — Telephone Encounter (Signed)
Called pt to verify if he was able to see the [place] surgeon as previously planned by Dr. Vicente Males. LVM to return call

## 2017-12-03 NOTE — Telephone Encounter (Signed)
-----   Message from Jonathon Bellows, MD sent at 11/25/2017  7:33 PM EDT ----- Sherald Hess  Please check if he did see GI at [facility] or [facility]

## 2017-12-08 DIAGNOSIS — N390 Urinary tract infection, site not specified: Secondary | ICD-10-CM | POA: Diagnosis not present

## 2017-12-08 DIAGNOSIS — R300 Dysuria: Secondary | ICD-10-CM | POA: Diagnosis not present

## 2017-12-08 DIAGNOSIS — R319 Hematuria, unspecified: Secondary | ICD-10-CM | POA: Diagnosis not present

## 2018-01-04 DIAGNOSIS — N182 Chronic kidney disease, stage 2 (mild): Secondary | ICD-10-CM | POA: Diagnosis not present

## 2018-01-04 DIAGNOSIS — E669 Obesity, unspecified: Secondary | ICD-10-CM | POA: Diagnosis not present

## 2018-01-04 DIAGNOSIS — G4733 Obstructive sleep apnea (adult) (pediatric): Secondary | ICD-10-CM | POA: Diagnosis not present

## 2018-01-04 DIAGNOSIS — E118 Type 2 diabetes mellitus with unspecified complications: Secondary | ICD-10-CM | POA: Diagnosis not present

## 2018-02-05 NOTE — Telephone Encounter (Signed)
Called pt to verify that he was able see the [place] surgeon. Unable to contact via phone. LVM to return call. Will mail pt letter.

## 2018-03-09 ENCOUNTER — Inpatient Hospital Stay
Admission: EM | Admit: 2018-03-09 | Discharge: 2018-03-12 | DRG: 872 | Disposition: A | Discharge status: Home / Self Care | Payer: Medicare Other | Attending: Internal Medicine | Admitting: Internal Medicine

## 2018-03-09 ENCOUNTER — Other Ambulatory Visit: Payer: Self-pay

## 2018-03-09 ENCOUNTER — Encounter: Payer: Self-pay | Admitting: Emergency Medicine

## 2018-03-09 ENCOUNTER — Emergency Department: Payer: Medicare Other

## 2018-03-09 DIAGNOSIS — E876 Hypokalemia: Secondary | ICD-10-CM | POA: Diagnosis not present

## 2018-03-09 DIAGNOSIS — E119 Type 2 diabetes mellitus without complications: Secondary | ICD-10-CM | POA: Diagnosis not present

## 2018-03-09 DIAGNOSIS — Z79899 Other long term (current) drug therapy: Secondary | ICD-10-CM

## 2018-03-09 DIAGNOSIS — N390 Urinary tract infection, site not specified: Secondary | ICD-10-CM | POA: Diagnosis not present

## 2018-03-09 DIAGNOSIS — Z794 Long term (current) use of insulin: Secondary | ICD-10-CM | POA: Diagnosis not present

## 2018-03-09 DIAGNOSIS — E1151 Type 2 diabetes mellitus with diabetic peripheral angiopathy without gangrene: Secondary | ICD-10-CM | POA: Diagnosis not present

## 2018-03-09 DIAGNOSIS — Z7982 Long term (current) use of aspirin: Secondary | ICD-10-CM | POA: Diagnosis not present

## 2018-03-09 DIAGNOSIS — R5381 Other malaise: Secondary | ICD-10-CM | POA: Diagnosis not present

## 2018-03-09 DIAGNOSIS — A4151 Sepsis due to Escherichia coli [E. coli]: Principal | ICD-10-CM | POA: Diagnosis present

## 2018-03-09 DIAGNOSIS — I10 Essential (primary) hypertension: Secondary | ICD-10-CM | POA: Diagnosis not present

## 2018-03-09 DIAGNOSIS — R531 Weakness: Secondary | ICD-10-CM | POA: Diagnosis not present

## 2018-03-09 DIAGNOSIS — H8110 Benign paroxysmal vertigo, unspecified ear: Secondary | ICD-10-CM | POA: Diagnosis not present

## 2018-03-09 DIAGNOSIS — F329 Major depressive disorder, single episode, unspecified: Secondary | ICD-10-CM | POA: Diagnosis not present

## 2018-03-09 DIAGNOSIS — Z7902 Long term (current) use of antithrombotics/antiplatelets: Secondary | ICD-10-CM | POA: Diagnosis not present

## 2018-03-09 DIAGNOSIS — R509 Fever, unspecified: Secondary | ICD-10-CM | POA: Diagnosis not present

## 2018-03-09 DIAGNOSIS — N179 Acute kidney failure, unspecified: Secondary | ICD-10-CM | POA: Diagnosis present

## 2018-03-09 DIAGNOSIS — E785 Hyperlipidemia, unspecified: Secondary | ICD-10-CM | POA: Diagnosis present

## 2018-03-09 DIAGNOSIS — N3000 Acute cystitis without hematuria: Secondary | ICD-10-CM | POA: Diagnosis present

## 2018-03-09 DIAGNOSIS — R000 Tachycardia, unspecified: Secondary | ICD-10-CM | POA: Diagnosis not present

## 2018-03-09 DIAGNOSIS — Z87440 Personal history of urinary (tract) infections: Secondary | ICD-10-CM

## 2018-03-09 DIAGNOSIS — A419 Sepsis, unspecified organism: Secondary | ICD-10-CM | POA: Diagnosis not present

## 2018-03-09 DIAGNOSIS — N3001 Acute cystitis with hematuria: Secondary | ICD-10-CM | POA: Diagnosis present

## 2018-03-09 DIAGNOSIS — R42 Dizziness and giddiness: Secondary | ICD-10-CM | POA: Diagnosis not present

## 2018-03-09 DIAGNOSIS — R0902 Hypoxemia: Secondary | ICD-10-CM | POA: Diagnosis not present

## 2018-03-09 LAB — URINALYSIS, ROUTINE W REFLEX MICROSCOPIC
Bilirubin Urine: NEGATIVE
Glucose, UA: NEGATIVE mg/dL
Ketones, ur: NEGATIVE mg/dL
Nitrite: POSITIVE — AB
Protein, ur: 100 mg/dL — AB
Specific Gravity, Urine: 1.013 (ref 1.005–1.030)
WBC, UA: >50 WBC/hpf — ABNORMAL HIGH (ref 0–5)
pH: 6 (ref 5.0–8.0)

## 2018-03-09 LAB — GLUCOSE, CAPILLARY
Glucose-Capillary: 140 mg/dL — ABNORMAL HIGH (ref 70–99)
Glucose-Capillary: 177 mg/dL — ABNORMAL HIGH (ref 70–99)

## 2018-03-09 LAB — COMPREHENSIVE METABOLIC PANEL
ALK PHOS: 71 U/L (ref 38–126)
ALT: 20 U/L (ref 0–44)
AST: 21 U/L (ref 15–41)
Albumin: 3.9 g/dL (ref 3.5–5.0)
Anion gap: 13 (ref 5–15)
BUN: 24 mg/dL — ABNORMAL HIGH (ref 8–23)
CALCIUM: 8.7 mg/dL — AB (ref 8.9–10.3)
CO2: 28 mmol/L (ref 22–32)
Chloride: 95 mmol/L — ABNORMAL LOW (ref 98–111)
Creatinine, Ser: 1.76 mg/dL — ABNORMAL HIGH (ref 0.61–1.24)
GFR calc Af Amer: 43 mL/min — ABNORMAL LOW (ref >60)
GFR calc non Af Amer: 38 mL/min — ABNORMAL LOW (ref >60)
GLUCOSE: 181 mg/dL — AB (ref 70–99)
Potassium: 3.2 mmol/L — ABNORMAL LOW (ref 3.5–5.1)
Sodium: 136 mmol/L (ref 135–145)
Total Bilirubin: 0.9 mg/dL (ref 0.3–1.2)
Total Protein: 7.8 g/dL (ref 6.5–8.1)

## 2018-03-09 LAB — CBC WITH DIFFERENTIAL/PLATELET
Abs Immature Granulocytes: 0.08 10*3/uL — ABNORMAL HIGH (ref 0.00–0.07)
Basophils Absolute: 0 10*3/uL (ref 0.0–0.1)
Basophils Relative: 0 %
Eosinophils Absolute: 0 10*3/uL (ref 0.0–0.5)
Eosinophils Relative: 0 %
HEMATOCRIT: 39.8 % (ref 39.0–52.0)
Hemoglobin: 13.5 g/dL (ref 13.0–17.0)
Immature Granulocytes: 1 %
Lymphocytes Relative: 11 %
Lymphs Abs: 1.4 10*3/uL (ref 0.7–4.0)
MCH: 32.3 pg (ref 26.0–34.0)
MCHC: 33.9 g/dL (ref 30.0–36.0)
MCV: 95.2 fL (ref 80.0–100.0)
MONOS PCT: 9 %
Monocytes Absolute: 1.2 10*3/uL — ABNORMAL HIGH (ref 0.1–1.0)
Neutro Abs: 10.8 10*3/uL — ABNORMAL HIGH (ref 1.7–7.7)
Neutrophils Relative %: 79 %
Platelets: 212 10*3/uL (ref 150–400)
RBC: 4.18 MIL/uL — ABNORMAL LOW (ref 4.22–5.81)
RDW: 14.6 % (ref 11.5–15.5)
WBC: 13.6 10*3/uL — ABNORMAL HIGH (ref 4.0–10.5)
nRBC: 0 % (ref 0.0–0.2)

## 2018-03-09 LAB — INFLUENZA PANEL BY PCR (TYPE A & B)
Influenza A By PCR: NEGATIVE
Influenza B By PCR: NEGATIVE

## 2018-03-09 LAB — LACTIC ACID, PLASMA
Lactic Acid, Venous: 2.3 mmol/L (ref 0.5–1.9)
Lactic Acid, Venous: 1.3 mmol/L (ref 0.5–1.9)

## 2018-03-09 MED ORDER — DOCUSATE SODIUM 100 MG PO CAPS
100.0000 mg | ORAL_CAPSULE | Freq: Two times a day (BID) | ORAL | Status: DC | PRN
  Administered 2018-03-11 – 2018-03-12 (×2): 100 mg via ORAL
  Filled 2018-03-09 (×2): qty 1

## 2018-03-09 MED ORDER — SIMVASTATIN 10 MG PO TABS
20.0000 mg | ORAL_TABLET | Freq: Every day | ORAL | Status: DC
  Administered 2018-03-10 – 2018-03-11 (×2): 20 mg via ORAL
  Filled 2018-03-09 (×3): qty 2

## 2018-03-09 MED ORDER — SODIUM CHLORIDE 0.9 % IV BOLUS (SEPSIS)
1000.0000 mL | Freq: Once | INTRAVENOUS | Status: AC
  Administered 2018-03-09: 1000 mL via INTRAVENOUS

## 2018-03-09 MED ORDER — ONDANSETRON HCL 4 MG PO TABS: 4.0000 mg | ORAL_TABLET | Freq: Four times a day (QID) | ORAL | Status: DC | PRN

## 2018-03-09 MED ORDER — CLONIDINE HCL 0.1 MG PO TABS
0.2000 mg | ORAL_TABLET | Freq: Two times a day (BID) | ORAL | Status: DC
  Administered 2018-03-09 – 2018-03-12 (×6): 0.2 mg via ORAL
  Filled 2018-03-09 (×6): qty 2

## 2018-03-09 MED ORDER — LORATADINE 10 MG PO TABS: 10.0000 mg | ORAL_TABLET | Freq: Every day | ORAL | Status: DC | PRN

## 2018-03-09 MED ORDER — SODIUM CHLORIDE 0.9 % IV SOLN
INTRAVENOUS | Status: AC
  Administered 2018-03-09 – 2018-03-10 (×3): via INTRAVENOUS

## 2018-03-09 MED ORDER — SODIUM CHLORIDE 0.9 % IV BOLUS (SEPSIS)
600.0000 mL | Freq: Once | INTRAVENOUS | Status: AC
  Administered 2018-03-09: 600 mL via INTRAVENOUS

## 2018-03-09 MED ORDER — METOPROLOL SUCCINATE ER 50 MG PO TB24
100.0000 mg | ORAL_TABLET | Freq: Every day | ORAL | Status: DC
  Administered 2018-03-09 – 2018-03-12 (×4): 100 mg via ORAL
  Filled 2018-03-09 (×4): qty 2

## 2018-03-09 MED ORDER — ASPIRIN EC 81 MG PO TBEC
81.0000 mg | DELAYED_RELEASE_TABLET | Freq: Every day | ORAL | Status: DC
  Administered 2018-03-10 – 2018-03-12 (×3): 81 mg via ORAL
  Filled 2018-03-09 (×3): qty 1

## 2018-03-09 MED ORDER — ACETAMINOPHEN 325 MG PO TABS: 650.0000 mg | ORAL_TABLET | Freq: Four times a day (QID) | ORAL | Status: DC | PRN

## 2018-03-09 MED ORDER — ONDANSETRON HCL 4 MG/2ML IJ SOLN: 4.0000 mg | Freq: Four times a day (QID) | INTRAMUSCULAR | Status: DC | PRN

## 2018-03-09 MED ORDER — ENOXAPARIN SODIUM 40 MG/0.4ML ~~LOC~~ SOLN
40.0000 mg | Freq: Two times a day (BID) | SUBCUTANEOUS | Status: DC
  Administered 2018-03-09 – 2018-03-12 (×6): 40 mg via SUBCUTANEOUS
  Filled 2018-03-09 (×6): qty 0.4

## 2018-03-09 MED ORDER — INSULIN ASPART 100 UNIT/ML ~~LOC~~ SOLN: 0.0000 [IU] | Freq: Every day | SUBCUTANEOUS | Status: DC

## 2018-03-09 MED ORDER — LATANOPROST-TIMOLOL MALEATE 0.005-0.5 % OP SOLN: 1.0000 [drp] | Freq: Every day | OPHTHALMIC | Status: DC

## 2018-03-09 MED ORDER — INSULIN ASPART 100 UNIT/ML ~~LOC~~ SOLN
0.0000 [IU] | Freq: Three times a day (TID) | SUBCUTANEOUS | Status: DC
  Administered 2018-03-10 (×2): 1 [IU] via SUBCUTANEOUS
  Administered 2018-03-11 (×2): 2 [IU] via SUBCUTANEOUS
  Administered 2018-03-12 (×2): 1 [IU] via SUBCUTANEOUS
  Filled 2018-03-09 (×6): qty 1

## 2018-03-09 MED ORDER — METOPROLOL TARTRATE 5 MG/5ML IV SOLN: 5.0000 mg | INTRAVENOUS | Status: DC | PRN

## 2018-03-09 MED ORDER — SODIUM CHLORIDE 0.9 % IV SOLN
1.0000 g | Freq: Once | INTRAVENOUS | Status: AC
  Administered 2018-03-09: 1 g via INTRAVENOUS
  Filled 2018-03-09: qty 10

## 2018-03-09 MED ORDER — DULOXETINE HCL 30 MG PO CPEP
30.0000 mg | ORAL_CAPSULE | Freq: Every day | ORAL | Status: DC
  Administered 2018-03-10 – 2018-03-12 (×3): 30 mg via ORAL
  Filled 2018-03-09 (×3): qty 1

## 2018-03-09 MED ORDER — AMLODIPINE BESYLATE 5 MG PO TABS
10.0000 mg | ORAL_TABLET | Freq: Every day | ORAL | Status: DC
  Administered 2018-03-10 – 2018-03-12 (×3): 10 mg via ORAL
  Filled 2018-03-09 (×3): qty 2

## 2018-03-09 MED ORDER — ACETAMINOPHEN 650 MG RE SUPP: 650.0000 mg | Freq: Four times a day (QID) | RECTAL | Status: DC | PRN

## 2018-03-09 MED ORDER — TIMOLOL MALEATE 0.5 % OP SOLN
1.0000 [drp] | Freq: Every day | OPHTHALMIC | Status: DC
  Administered 2018-03-09: 1 [drp] via OPHTHALMIC
  Filled 2018-03-09: qty 5

## 2018-03-09 MED ORDER — MECLIZINE HCL 25 MG PO TABS
12.5000 mg | ORAL_TABLET | Freq: Two times a day (BID) | ORAL | Status: DC | PRN
  Filled 2018-03-09: qty 0.5

## 2018-03-09 MED ORDER — LATANOPROST 0.005 % OP SOLN
1.0000 [drp] | Freq: Every day | OPHTHALMIC | Status: DC
  Administered 2018-03-10 – 2018-03-11 (×2): 1 [drp] via OPHTHALMIC
  Filled 2018-03-09: qty 2.5

## 2018-03-09 MED ORDER — INSULIN GLARGINE 100 UNIT/ML ~~LOC~~ SOLN
50.0000 [IU] | Freq: Every morning | SUBCUTANEOUS | Status: DC
  Administered 2018-03-10 – 2018-03-12 (×3): 50 [IU] via SUBCUTANEOUS
  Filled 2018-03-09 (×4): qty 0.5

## 2018-03-09 MED ORDER — POTASSIUM CHLORIDE CRYS ER 20 MEQ PO TBCR
40.0000 meq | EXTENDED_RELEASE_TABLET | Freq: Once | ORAL | Status: AC
  Administered 2018-03-09: 40 meq via ORAL
  Filled 2018-03-09: qty 2

## 2018-03-09 MED ORDER — CLOPIDOGREL BISULFATE 75 MG PO TABS
75.0000 mg | ORAL_TABLET | Freq: Every day | ORAL | Status: DC
  Administered 2018-03-10 – 2018-03-12 (×3): 75 mg via ORAL
  Filled 2018-03-09 (×3): qty 1

## 2018-03-09 MED ORDER — SODIUM CHLORIDE 0.9 % IV SOLN
1.0000 g | INTRAVENOUS | Status: DC
  Administered 2018-03-10 – 2018-03-11 (×2): 1 g via INTRAVENOUS
  Filled 2018-03-09 (×2): qty 1
  Filled 2018-03-09: qty 10

## 2018-03-09 NOTE — ED Notes (Signed)
.. ED TO INPATIENT HANDOFF REPORT  Name/Age/Gender Robert [name] 73 y.o. male  Code Status    Code Status Orders  (From admission, onward)         Start     Ordered   03/09/18 1818  Full code  Continuous     03/09/18 1817        Code Status History    This patient has a current code status but no historical code status.      Home/SNF/Other Home  Chief Complaint possible uti  Level of Care/Admitting Diagnosis ED Disposition    ED Disposition Condition [facility] Area: [place] [[id number]]  Level of Care: Med-Surg [16]  Diagnosis: Acute cystitis [595.0.ICD-9-CM]  Admitting Physician: Nicholes Mango [[id number]]  Attending Physician: Nicholes Mango [[id number]]  Estimated length of stay: 3 - 4 days  Certification:: I certify this patient will need inpatient services for at least 2 midnights  PT Class (Do Not Modify): Inpatient [101]  PT Acc Code (Do Not Modify): Private [1]       Medical History Past Medical History:  Diagnosis Date  . Allergy   . Cellulitis   . Diabetes mellitus without complication ([place])   . Hyperlipidemia   . Hypertension   . Peripheral vascular disease ([place])     Allergies No Known Allergies  IV Location/Drains/Wounds Patient Lines/Drains/Airways Status   Active Line/Drains/Airways    Name:   Placement date:   Placement time:   Site:   Days:   Peripheral IV 03/09/18 Right Forearm   03/09/18    1600    Forearm   less than 1   Peripheral IV 03/09/18 Left Antecubital   03/09/18    1556    Antecubital   less than 1          Labs/Imaging Results for orders placed or performed during the hospital encounter of 03/09/18 (from the past 48 hour(s))  Lactic acid, plasma     Status: Abnormal   Collection Time: 03/09/18  3:56 PM  Result Value Ref Range   Lactic Acid, Venous 2.3 (HH) 0.5 - 1.9 mmol/L    Comment: CRITICAL RESULT CALLED TO, READ BACK BY AND VERIFIED WITH ANGELA ROBBINS RN AT 1640 03/09/18.  MSS Performed at [facility], [address] [place], [place] [address]   Comprehensive metabolic panel     Status: Abnormal   Collection Time: 03/09/18  3:56 PM  Result Value Ref Range   Sodium 136 135 - 145 mmol/L   Potassium 3.2 (L) 3.5 - 5.1 mmol/L   Chloride 95 (L) 98 - 111 mmol/L   CO2 28 22 - 32 mmol/L   Glucose, Bld 181 (H) 70 - 99 mg/dL   BUN 24 (H) 8 - 23 mg/dL   Creatinine, Ser 1.76 (H) 0.61 - 1.24 mg/dL   Calcium 8.7 (L) 8.9 - 10.3 mg/dL   Total Protein 7.8 6.5 - 8.1 g/dL   Albumin 3.9 3.5 - 5.0 g/dL   AST 21 15 - 41 U/L   ALT 20 0 - 44 U/L   Alkaline Phosphatase 71 38 - 126 U/L   Total Bilirubin 0.9 0.3 - 1.2 mg/dL   GFR calc non Af Amer 38 (L) >60 mL/min   GFR calc Af Amer 43 (L) >60 mL/min   Anion gap 13 5 - 15    Comment: Performed at [facility], [address], [place], [place] [address]  CBC WITH DIFFERENTIAL  Status: Abnormal   Collection Time: 03/09/18  3:56 PM  Result Value Ref Range   WBC 13.6 (H) 4.0 - 10.5 K/uL   RBC 4.18 (L) 4.22 - 5.81 MIL/uL   Hemoglobin 13.5 13.0 - 17.0 g/dL   HCT 39.8 39.0 - 52.0 %   MCV 95.2 80.0 - 100.0 fL   MCH 32.3 26.0 - 34.0 pg   MCHC 33.9 30.0 - 36.0 g/dL   RDW 14.6 11.5 - 15.5 %   Platelets 212 150 - 400 K/uL   nRBC 0.0 0.0 - 0.2 %   Neutrophils Relative % 79 %   Neutro Abs 10.8 (H) 1.7 - 7.7 K/uL   Lymphocytes Relative 11 %   Lymphs Abs 1.4 0.7 - 4.0 K/uL   Monocytes Relative 9 %   Monocytes Absolute 1.2 (H) 0.1 - 1.0 K/uL   Eosinophils Relative 0 %   Eosinophils Absolute 0.0 0.0 - 0.5 K/uL   Basophils Relative 0 %   Basophils Absolute 0.0 0.0 - 0.1 K/uL   Immature Granulocytes 1 %   Abs Immature Granulocytes 0.08 (H) 0.00 - 0.07 K/uL    Comment: Performed at [facility], [address] [place], [place] [address]  Urinalysis, Routine w reflex microscopic     Status: Abnormal   Collection Time: 03/09/18  3:56 PM  Result Value Ref Range   Color, Urine YELLOW (A) YELLOW    APPearance HAZY (A) CLEAR   Specific Gravity, Urine 1.013 1.005 - 1.030   pH 6.0 5.0 - 8.0   Glucose, UA NEGATIVE NEGATIVE mg/dL   Hgb urine dipstick MODERATE (A) NEGATIVE   Bilirubin Urine NEGATIVE NEGATIVE   Ketones, ur NEGATIVE NEGATIVE mg/dL   Protein, ur 100 (A) NEGATIVE mg/dL   Nitrite POSITIVE (A) NEGATIVE   Leukocytes, UA MODERATE (A) NEGATIVE   RBC / HPF 11-20 0 - 5 RBC/hpf   WBC, UA >50 (H) 0 - 5 WBC/hpf   Bacteria, UA MANY (A) NONE SEEN   Squamous Epithelial / LPF 0-5 0 - 5   Mucus PRESENT    Hyaline Casts, UA PRESENT     Comment: Performed at [facility], [address] [place], [place] [address]  Influenza panel by PCR (type A & B)     Status: None   Collection Time: 03/09/18  3:56 PM  Result Value Ref Range   Influenza A By PCR NEGATIVE NEGATIVE   Influenza B By PCR NEGATIVE NEGATIVE    Comment: (NOTE) The Xpert Xpress Flu assay is intended as an aid in the diagnosis of  influenza and should not be used as a sole basis for treatment.  This  assay is FDA approved for nasopharyngeal swab specimens only. Nasal  washings and aspirates are unacceptable for Xpert Xpress Flu testing. Performed at [facility], [address] [place], [place] [address]   Lactic acid, plasma     Status: None   Collection Time: 03/09/18  5:47 PM  Result Value Ref Range   Lactic Acid, Venous 1.3 0.5 - 1.9 mmol/L    Comment: Performed at [facility], [address], [place], [place] [address]   Dg Chest Portable 1 View  Result Date: 03/09/2018 CLINICAL DATA:  Fever.  Dizziness for 2 days. EXAM: PORTABLE CHEST 1 VIEW COMPARISON:  November 28, 2016 FINDINGS: Stable cardiomegaly. The hila, mediastinum, lungs, and pleura are otherwise unremarkable. IMPRESSION: No active disease. Electronically Signed   By: Dorise Bullion III M.D   On: 03/09/2018  17:04    Pending Labs Unresulted Labs (From admission, onward)    Start     Ordered   03/16/18 0500  Creatinine,  serum  (enoxaparin (LOVENOX)    CrCl >/= 30 ml/min)  Weekly,   STAT    Comments:  while on enoxaparin therapy    03/09/18 1817   03/10/18 0500  Protime-INR  Tomorrow morning,   STAT     03/09/18 1817   03/10/18 0500  Cortisol-am, blood  Tomorrow morning,   STAT     03/09/18 1817   03/10/18 0500  Procalcitonin  Tomorrow morning,   STAT     03/09/18 1817   03/10/18 0500  Comprehensive metabolic panel  Tomorrow morning,   STAT     03/09/18 1817   03/10/18 0500  CBC  Tomorrow morning,   STAT     03/09/18 1817   03/09/18 1818  CBC  (enoxaparin (LOVENOX)    CrCl >/= 30 ml/min)  Once,   STAT    Comments:  Baseline for enoxaparin therapy IF NOT ALREADY DRAWN.  Notify MD if PLT < 100 K.    03/09/18 1817   03/09/18 1818  Creatinine, serum  (enoxaparin (LOVENOX)    CrCl >/= 30 ml/min)  Once,   STAT    Comments:  Baseline for enoxaparin therapy IF NOT ALREADY DRAWN.    03/09/18 1817   03/09/18 1818  Urine culture  Once,   STAT     03/09/18 1817   03/09/18 1551  Blood Culture (routine x 2)  BLOOD CULTURE X 2,   STAT     03/09/18 1551          Vitals/Pain Today's Vitals   03/09/18 1700 03/09/18 1730 03/09/18 1800 03/09/18 1813  BP: (!) 162/80 (!) 171/86 (!) 162/81   Pulse: 99 95    Resp:      Temp:      TempSrc:      SpO2: 92% 94%    Weight:      Height:      PainSc:    0-No pain    Isolation Precautions Droplet precaution  Medications Medications  aspirin EC tablet 81 mg (has no administration in time range)  amLODipine (NORVASC) tablet 10 mg (has no administration in time range)  metoprolol succinate (TOPROL-XL) 24 hr tablet 100 mg (has no administration in time range)  cloNIDine (CATAPRES) tablet 0.2 mg (has no administration in time range)  simvastatin (ZOCOR) tablet 20 mg (has no administration in time range)  DULoxetine (CYMBALTA) DR capsule 30 mg (has no administration in time range)  Insulin Glargine (LANTUS) Solostar Pen 50 Units (has no administration in time  range)  meclizine (ANTIVERT) tablet 12.5 mg (has no administration in time range)  clopidogrel (PLAVIX) tablet 75 mg (has no administration in time range)  loratadine (CLARITIN) tablet 10 mg (has no administration in time range)  Latanoprost-Timolol Maleate 0.005-0.5 % SOLN 1 drop (has no administration in time range)  enoxaparin (LOVENOX) injection 40 mg (has no administration in time range)  0.9 %  sodium chloride infusion (has no administration in time range)  cefTRIAXone (ROCEPHIN) 1 g in sodium chloride 0.9 % 100 mL IVPB (has no administration in time range)  docusate sodium (COLACE) capsule 100 mg (has no administration in time range)  ondansetron (ZOFRAN) tablet 4 mg (has no administration in time range)    Or  ondansetron (ZOFRAN) injection 4 mg (has no administration in time range)  acetaminophen (TYLENOL) tablet 650  mg (has no administration in time range)    Or  acetaminophen (TYLENOL) suppository 650 mg (has no administration in time range)  insulin aspart (novoLOG) injection 0-9 Units (has no administration in time range)  insulin aspart (novoLOG) injection 0-5 Units (has no administration in time range)  metoprolol tartrate (LOPRESSOR) injection 5 mg (has no administration in time range)  potassium chloride SA (K-DUR,KLOR-CON) CR tablet 40 mEq (has no administration in time range)  sodium chloride 0.9 % bolus 1,000 mL (1,000 mLs Intravenous Transfusing/Transfer 03/09/18 1816)    And  sodium chloride 0.9 % bolus 1,000 mL (1,000 mLs Intravenous Transfusing/Transfer 03/09/18 1816)    And  sodium chloride 0.9 % bolus 600 mL (0 mLs Intravenous Stopped 03/09/18 1739)  cefTRIAXone (ROCEPHIN) 1 g in sodium chloride 0.9 % 100 mL IVPB (0 g Intravenous Stopped 03/09/18 1739)    Mobility walks with device cane

## 2018-03-09 NOTE — ED Triage Notes (Signed)
Here for foul smelling urine and dizziness X 2 days.  Weakness to both legs.  Febrile here.

## 2018-03-09 NOTE — Progress Notes (Signed)
Family Meeting Note  Advance Directive:yes  Today a meeting took place with the Patient.    The following clinical team members were present during this meeting:MD  The following were discussed:Patient's diagnosis: Sepsis, acute cystitis, dizziness, weakness, with history of benign paroxysmal positional vertigo, hypokalemia, AKI, hypertension, hyperlipidemia, peripheral vascular disease, treatment plan of care discussed in detail with the patient.  He verbalized understanding of the plan    patient's progosis: Unable to determine and Goals for treatment: Full Code, wife is HCPOA  Additional follow-up to be provided: Hospitalist  Time spent during discussion:17MIN  Nicholes Mango, MD

## 2018-03-09 NOTE — H&P (Signed)
[facility] [place] NAME: Robert [name]    MR#:  [id number]  DATE OF BIRTH:  [birth year]  DATE OF ADMISSION:  03/09/2018  PRIMARY CARE PHYSICIAN: Cletis Athens, MD   REQUESTING/REFERRING PHYSICIAN: Archie Balboa MD  CHIEF COMPLAINT:   Dizziness and foul-smelling urine for 2 days HISTORY OF PRESENT ILLNESS:  Robert [name]  is a 73 y.o. male with a known history of insulin requiring diabetes mellitus, hyperlipidemia, hypertension, peripheral vascular disease and history of vertigo is presenting to the ED with a chief complaint of 2-day history of foul-smelling urine and dizziness.  Patient is feeling weak and febrile.  White blood cell count is elevated and lactic acid is elevated.  Patient is tachycardic and was treated for UTI approximately 1 month ago with a primary care physician , completed the course of antibiotic but he could not recall the name of the antibiotic.  Patient felt better after completing the antibiotic treatment but again coming with frequent urination weakness and dizziness today.  Cultures were obtained and patient is started on IV Rocephin and hospitalist team is called admit the patient.  Wife at bedside.  Patient denies any abdominal pain nausea or vomiting  PAST MEDICAL HISTORY:   Past Medical History:  Diagnosis Date  . Allergy   . Cellulitis   . Diabetes mellitus without complication ([place])   . Hyperlipidemia   . Hypertension   . Peripheral vascular disease ([place])     PAST SURGICAL HISTOIRY:   Past Surgical History:  Procedure Laterality Date  . COLONOSCOPY WITH PROPOFOL N/A 10/02/2017   Procedure: COLONOSCOPY WITH PROPOFOL;  Surgeon: Jonathon Bellows, MD;  Location: [facility] ENDOSCOPY;  Service: Gastroenterology;  Laterality: N/A;  . HERNIA REPAIR      SOCIAL HISTORY:   Social History   Tobacco Use  . Smoking status: Never Smoker  . Smokeless tobacco: Never Used  Substance Use Topics  . Alcohol use: No     FAMILY HISTORY:   Family History  Problem Relation Age of Onset  . Heart disease Mother   . Stroke Mother   . Heart disease Father   . Heart attack Father     DRUG ALLERGIES:  No Known Allergies  REVIEW OF SYSTEMS:  CONSTITUTIONAL: Patient has fever, fatigue or weakness.  EYES: No blurred or double vision.  EARS, NOSE, AND THROAT: No tinnitus or ear pain.  RESPIRATORY: No cough, shortness of breath, wheezing or hemoptysis.  CARDIOVASCULAR: No chest pain, orthopnea, edema.  GASTROINTESTINAL: No nausea, vomiting, diarrhea or abdominal pain.  GENITOURINARY: No dysuria, hematuria.  Reporting frequent urination ENDOCRINE: No polyuria, nocturia,  HEMATOLOGY: No anemia, easy bruising or bleeding SKIN: No rash or lesion. MUSCULOSKELETAL: No joint pain or arthritis.   NEUROLOGIC: No tingling, numbness, weakness.  Reporting dizziness PSYCHIATRY: No anxiety or depression.   MEDICATIONS AT HOME:   Prior to Admission medications   Medication Sig Start Date End Date Taking? Authorizing Provider  amLODipine (NORVASC) 10 MG tablet  02/07/17   [provider]  aspirin EC 81 MG tablet Take 81 mg by mouth daily.    [provider]  cloNIDine (CATAPRES) 0.2 MG tablet  03/20/17   [provider]  clopidogrel (PLAVIX) 75 MG tablet  02/19/17   [provider]  DULoxetine (CYMBALTA) 30 MG capsule  02/27/17   [provider]  furosemide (LASIX) 40 MG tablet  03/26/17   [provider]  hydrochlorothiazide (HYDRODIURIL) 25 MG tablet  02/09/17  [provider]  HYDROcodone-acetaminophen (NORCO) 5-325 MG tablet Take 1 tablet by mouth every 6 (six) hours as needed for moderate pain. Patient not taking: Reported on 05/09/2017 08/21/15   Renata Caprice  JANUVIA 100 MG tablet  03/12/17   [provider]  LANTUS SOLOSTAR 100 UNIT/ML Solostar Pen  03/28/17   [provider]  Latanoprost-Timolol Maleate 0.005-0.5 % SOLN  INSTILL ONE DROP TO BOTH EYES AT BEDTIME 07/06/15   [provider]  lisinopril (PRINIVIL,ZESTRIL) 5 MG tablet  03/07/17   [provider]  loratadine (CLARITIN) 10 MG tablet Take 10 mg by mouth daily as needed for allergies.    [provider]  meclizine (ANTIVERT) 12.5 MG tablet  03/06/17   [provider]  metFORMIN (GLUCOPHAGE) 1000 MG tablet  02/19/17   [provider]  metoprolol succinate (TOPROL-XL) 100 MG 24 hr tablet  02/21/17   [provider]  potassium chloride (K-DUR,KLOR-CON) 10 MEQ tablet  02/01/17   [provider]  simvastatin (ZOCOR) 20 MG tablet  04/06/17   [provider]  sulfamethoxazole-trimethoprim (BACTRIM DS,SEPTRA DS) 800-160 MG tablet Take 1 tablet by mouth 2 (two) times daily. X 7 days Patient not taking: Reported on 05/09/2017 08/21/15   Robert Guess, PA-C      VITAL SIGNS:  Blood pressure (!) 162/81, pulse (!) 103, temperature (!) 101.5 F (38.6 C), temperature source Oral, resp. rate (!) 22, height 6' 3"  (1.905 m), weight (!) 163.3 kg, SpO2 93 %.  PHYSICAL EXAMINATION:  GENERAL:  73 y.o.-year-old patient lying in the bed with no acute distress.  EYES: Pupils equal, round, reactive to light and accommodation. No scleral icterus. Extraocular muscles intact.  HEENT: Head atraumatic, normocephalic. Oropharynx and nasopharynx clear.  NECK:  Supple, no jugular venous distention. No thyroid enlargement, no tenderness.  LUNGS: Normal breath sounds bilaterally, no wheezing, rales,rhonchi or crepitation. No use of accessory muscles of respiration.  CARDIOVASCULAR: S1, S2 normal. No murmurs, rubs, or gallops.  ABDOMEN: Soft, nontender, nondistended. Bowel sounds present.  EXTREMITIES: No pedal edema, cyanosis, or clubbing.  NEUROLOGIC: Awake, alert and oriented x3  sensation intact. Gait not checked.  PSYCHIATRIC: The patient is alert and oriented x 3.  SKIN: No obvious rash, lesion, or ulcer.    LABORATORY PANEL:   CBC Recent Labs  Lab 03/09/18 1556  WBC 13.6*  HGB 13.5  HCT 39.8  PLT 212   ------------------------------------------------------------------------------------------------------------------  Chemistries  Recent Labs  Lab 03/09/18 1556  NA 136  K 3.2*  CL 95*  CO2 28  GLUCOSE 181*  BUN 24*  CREATININE 1.76*  CALCIUM 8.7*  AST 21  ALT 20  ALKPHOS 71  BILITOT 0.9   ------------------------------------------------------------------------------------------------------------------  Cardiac Enzymes No results for input(s): TROPONINI in the last 168 hours. ------------------------------------------------------------------------------------------------------------------  RADIOLOGY:  Dg Chest Portable 1 View  Result Date: 03/09/2018 CLINICAL DATA:  Fever.  Dizziness for 2 days. EXAM: PORTABLE CHEST 1 VIEW COMPARISON:  November 28, 2016 FINDINGS: Stable cardiomegaly. The hila, mediastinum, lungs, and pleura are otherwise unremarkable. IMPRESSION: No active disease. Electronically Signed   By: Dorise Bullion III M.D   On: 03/09/2018 17:04    EKG:   Orders placed or performed during the hospital encounter of 03/09/18  . ED EKG 12-Lead  . ED EKG 12-Lead  . EKG 12-Lead  . EKG 12-Lead    IMPRESSION AND PLAN:    #Sepsis secondary to recurrent UTI Admit to MedSurg unit Patient met septic criteria at  the time of admission with leukocytosis, fever, elevated lactic acid and tachycardia Provide IV fluid bolus and continue IV fluids and monitor lactic acid level IV Rocephin Follow-up on the culture results  #Recurrent UTI IV Rocephin, IV fluids and follow-up on the urine culture and sensitivity to narrow down the antibiotic coverage  #Dizziness from vertigo probably benign paroxysmal positional vertigo Meclizine as needed IV fluids, PT evaluation. Patient might be benefited with outpatient ENT evaluation and canalith repositioning if it is benign  paroxysmal positional vertigo Out of bed with assistance only  #Insulin requiring diabetes mellitus Continue Lantus 50 units and hold other p.o. medications Sliding scale insulin Consult diabetic coordinator and check hemoglobin A1c  #AKI-could be prerenal IV fluids and hold Lasix, lisinopril and hydrochlorothiazide Monitor renal function closely  #Essential hypertension Continue home medication Toprol, amlodipine and clonidine Holding Lasix, lisinopril and hydrochlorothiazide in view of AKI  DVT prophylaxis with Lovenox subcu renal dose adjusted   All the records are reviewed and case discussed with ED provider. Management plans discussed with the patient, family and they are in agreement.  CODE STATUS: FC   TOTAL TIME TAKING CARE OF THIS PATIENT: 43  minutes.   Note: This dictation was prepared with Dragon dictation along with smaller phrase technology. Any transcriptional errors that result from this process are unintentional.  Nicholes Mango M.D on 03/09/2018 at 6:01 PM  Between 7am to 6pm - Pager - [phone]  After 6pm go to [email or web address] - password [email or web address] [place] [facility]  Office  [phone]  CC: Primary care physician; Cletis Athens, MD

## 2018-03-09 NOTE — ED Provider Notes (Signed)
[facility] Emergency Department Provider Note  ____________________________________________   I have reviewed the triage vital signs and the nursing notes.   HISTORY  Chief Complaint Weakness   History limited by: Not Limited   HPI Robert [name] is a 73 y.o. male who presents to the emergency department today because of concern for weakness and dizziness. The patient states that the symptoms started 2 days ago. They have been constant since. The patient has noticed bad odor to his urine. States he was treated for a UTI roughly one month ago. He has not noticed any fevers, nausea or vomiting, chest pain or shortness of breath.    Per medical record review patient has a history of DM, HTN, HLD.   Past Medical History:  Diagnosis Date  . Allergy   . Cellulitis   . Diabetes mellitus without complication ([place])   . Hyperlipidemia   . Hypertension   . Peripheral vascular disease [facility])     Patient Active Problem List   Diagnosis Date Noted  . Lymphedema 05/09/2017  . Chronic venous insufficiency 05/09/2017  . Cellulitis of right lower extremity 04/09/2017  . Essential hypertension 04/09/2017  . Type 2 diabetes mellitus with complication ([place]) 28/41/3244  . Hyperlipidemia 04/09/2017    Past Surgical History:  Procedure Laterality Date  . COLONOSCOPY WITH PROPOFOL N/A 10/02/2017   Procedure: COLONOSCOPY WITH PROPOFOL;  Surgeon: Jonathon Bellows, MD;  Location: [facility] ENDOSCOPY;  Service: Gastroenterology;  Laterality: N/A;  . HERNIA REPAIR      Prior to Admission medications   Medication Sig Start Date End Date Taking? Authorizing Provider  amLODipine (NORVASC) 10 MG tablet  02/07/17   [provider]  aspirin EC 81 MG tablet Take 81 mg by mouth daily.    [provider]  cloNIDine (CATAPRES) 0.2 MG tablet  03/20/17   [provider]  clopidogrel (PLAVIX) 75 MG tablet  02/19/17   [provider]  DULoxetine  (CYMBALTA) 30 MG capsule  02/27/17   [provider]  furosemide (LASIX) 40 MG tablet  03/26/17   [provider]  hydrochlorothiazide (HYDRODIURIL) 25 MG tablet  02/09/17   [provider]  HYDROcodone-acetaminophen (NORCO) 5-325 MG tablet Take 1 tablet by mouth every 6 (six) hours as needed for moderate pain. Patient not taking: Reported on 05/09/2017 08/21/15   Renata Caprice  JANUVIA 100 MG tablet  03/12/17   [provider]  LANTUS SOLOSTAR 100 UNIT/ML Solostar Pen  03/28/17   [provider]  Latanoprost-Timolol Maleate 0.005-0.5 % SOLN INSTILL ONE DROP TO BOTH EYES AT BEDTIME 07/06/15   [provider]  lisinopril (PRINIVIL,ZESTRIL) 5 MG tablet  03/07/17   [provider]  loratadine (CLARITIN) 10 MG tablet Take 10 mg by mouth daily as needed for allergies.    [provider]  meclizine (ANTIVERT) 12.5 MG tablet  03/06/17   [provider]  metFORMIN (GLUCOPHAGE) 1000 MG tablet  02/19/17   [provider]  metoprolol succinate (TOPROL-XL) 100 MG 24 hr tablet  02/21/17   [provider]  potassium chloride (K-DUR,KLOR-CON) 10 MEQ tablet  02/01/17   [provider]  simvastatin (ZOCOR) 20 MG tablet  04/06/17   [provider]  sulfamethoxazole-trimethoprim (BACTRIM DS,SEPTRA DS) 800-160 MG tablet Take 1 tablet by mouth 2 (two) times daily. X 7 days Patient not taking: Reported on 05/09/2017 08/21/15   Duanne Guess, PA-C    Allergies Patient has no known allergies.  Family History  Problem Relation Age of Onset  . Heart disease Mother   . Stroke Mother   . Heart disease Father   . Heart attack Father     Social History Social History   Tobacco Use  . Smoking status: Never Smoker  . Smokeless tobacco: Never Used  Substance Use Topics  . Alcohol use: No  . Drug use: No    Review of Systems Constitutional: No fever/chills Eyes: No visual changes. ENT: No sore  throat. Cardiovascular: Denies chest pain. Respiratory: Denies shortness of breath. Gastrointestinal: No abdominal pain.  No nausea, no vomiting.  No diarrhea.   Genitourinary: Positive for bad odor to the urine Musculoskeletal: Negative for back pain. Skin: Negative for rash. Neurological: Positive for dizziness. ____________________________________________   PHYSICAL EXAM:  VITAL SIGNS: ED Triage Vitals  Enc Vitals Group     BP 03/09/18 1548 (!) 140/94     Pulse Rate 03/09/18 1548 (!) 120     Resp 03/09/18 1548 (!) 24     Temp 03/09/18 1548 (!) 101.5 F (38.6 C)     Temp Source 03/09/18 1548 Oral     SpO2 03/09/18 1548 93 %     Weight 03/09/18 1547 (!) 360 lb (163.3 kg)     Height 03/09/18 1547 6\' 3"  (1.905 m)     Head Circumference --      Peak Flow --      Pain Score 03/09/18 1546 0   Constitutional: Alert and oriented.  Eyes: Conjunctivae are normal.  ENT      Head: Normocephalic and atraumatic.      Nose: No congestion/rhinnorhea.      Mouth/Throat: Mucous membranes are moist.      Neck: No stridor. Hematological/Lymphatic/Immunilogical: No cervical lymphadenopathy. Cardiovascular: Tachycardic, regular rhythm.  No murmurs, rubs, or gallops.  Respiratory: Normal respiratory effort without tachypnea nor retractions. Breath sounds are clear and equal bilaterally. No wheezes/rales/rhonchi. Gastrointestinal: Soft and non tender. No rebound. No guarding.  Genitourinary: Deferred Musculoskeletal: Normal range of motion in all extremities. 1+ bilateral lower extremity edema Neurologic:  Normal speech and language. No gross focal neurologic deficits are appreciated.  Skin:  Skin is warm, dry and intact. No rash noted. Psychiatric: Mood and affect are normal. Speech and behavior are normal. Patient exhibits appropriate insight and judgment.  ____________________________________________    LABS (pertinent positives/negatives)  UA hazy, moderate hgb dipstick, positive  nitrite, moderate leukocytes, 11-20 rbc, >50 wbc, many bacteria Influenza negative Lactic acid 2.3 CMP na 136, k 3.2, glu 181, cr 1.76 CBC wbc 13.6, hgb 13.5, plt 212 ____________________________________________   EKG  I, Nance Pear, attending physician, personally viewed and interpreted this EKG  EKG Time: 1612 Rate: 109 Rhythm: sinus tachycardia Axis: normal Intervals: qtc 446 QRS: narrow, q waves III ST changes: no st elevation Impression: abnormal ekg  ____________________________________________    RADIOLOGY  CXR No acute disease  ____________________________________________   PROCEDURES  Procedures  CRITICAL CARE Performed by: Nance Pear   Total critical care time: 35 minutes  Critical care time was exclusive of separately billable procedures and treating other patients.  Critical care was necessary to treat or prevent imminent or life-threatening deterioration.  Critical care was time spent personally by me on the following activities: development of treatment plan with patient and/or surrogate as well as nursing, discussions with consultants, evaluation of patient's response to treatment, examination of patient, obtaining history from patient or surrogate, ordering and performing treatments and interventions, ordering and review of  laboratory studies, ordering and review of radiographic studies, pulse oximetry and re-evaluation of patient's condition.  ____________________________________________   INITIAL IMPRESSION / ASSESSMENT AND PLAN / ED COURSE  Pertinent labs & imaging results that were available during my care of the patient were reviewed by me and considered in my medical decision making (see chart for details).   Patient presented to the emergency department today because of couple days history of weakness and dizziness.  Initial vital signs were concerning for possible infectious source given tachycardia, tachypnea and fever.   Patient's chest x-ray without any acute findings however urine was concerning for urinary tract infection.  Patient states he has had 1 of these in the past.  Patient's lactic acid was elevated.  Patient will be given IV antibiotics and IV fluids.  Discussed findings with the patient.  Will plan on admission to the hospital service.  ___________________________________________   FINAL CLINICAL IMPRESSION(S) / ED DIAGNOSES  Final diagnoses:  Sepsis, due to unspecified organism, unspecified whether acute organ dysfunction present [facility])  Lower urinary tract infectious disease     Note: This dictation was prepared with Dragon dictation. Any transcriptional errors that result from this process are unintentional     Nance Pear, MD 03/09/18 [phone]

## 2018-03-09 NOTE — Progress Notes (Signed)
PHARMACIST - PHYSICIAN COMMUNICATION  CONCERNING:  Enoxaparin (Lovenox) for DVT Prophylaxis    RECOMMENDATION: Patient was prescribed enoxaprin 40mg  q24 hours for VTE prophylaxis.   Filed Weights   03/09/18 1547  Weight: (!) 360 lb (163.3 kg)    Body mass index is 45 kg/m.  Estimated Creatinine Clearance: 61.3 mL/min (A) (by C-G formula based on SCr of 1.76 mg/dL (H)).   Based on [place] patient is candidate for enoxaparin 40mg  every 12 hour dosing due to BMI being >40.  DESCRIPTION: Pharmacy has adjusted enoxaparin dose per [facility] policy.  Patient is now receiving enoxaparin 40mg  every 12 hours.   Forrest Moron, PharmD Clinical Pharmacist  03/09/2018 6:48 PM

## 2018-03-10 LAB — PROTIME-INR
INR: 1.16
Prothrombin Time: 14.7 seconds (ref 11.4–15.2)

## 2018-03-10 LAB — COMPREHENSIVE METABOLIC PANEL
ALT: 17 U/L (ref 0–44)
AST: 17 U/L (ref 15–41)
Albumin: 3.3 g/dL — ABNORMAL LOW (ref 3.5–5.0)
Alkaline Phosphatase: 54 U/L (ref 38–126)
Anion gap: 9 (ref 5–15)
BUN: 22 mg/dL (ref 8–23)
CHLORIDE: 100 mmol/L (ref 98–111)
CO2: 27 mmol/L (ref 22–32)
Calcium: 8 mg/dL — ABNORMAL LOW (ref 8.9–10.3)
Creatinine, Ser: 1.67 mg/dL — ABNORMAL HIGH (ref 0.61–1.24)
GFR calc Af Amer: 46 mL/min — ABNORMAL LOW (ref >60)
GFR calc non Af Amer: 40 mL/min — ABNORMAL LOW (ref >60)
Glucose, Bld: 140 mg/dL — ABNORMAL HIGH (ref 70–99)
POTASSIUM: 3.2 mmol/L — AB (ref 3.5–5.1)
Sodium: 136 mmol/L (ref 135–145)
Total Bilirubin: 0.8 mg/dL (ref 0.3–1.2)
Total Protein: 6.5 g/dL (ref 6.5–8.1)

## 2018-03-10 LAB — CBC
HCT: 35.1 % — ABNORMAL LOW (ref 39.0–52.0)
HEMOGLOBIN: 11.6 g/dL — AB (ref 13.0–17.0)
MCH: 31.9 pg (ref 26.0–34.0)
MCHC: 33 g/dL (ref 30.0–36.0)
MCV: 96.4 fL (ref 80.0–100.0)
Platelets: 182 10*3/uL (ref 150–400)
RBC: 3.64 MIL/uL — ABNORMAL LOW (ref 4.22–5.81)
RDW: 14.6 % (ref 11.5–15.5)
WBC: 9.8 10*3/uL (ref 4.0–10.5)
nRBC: 0 % (ref 0.0–0.2)

## 2018-03-10 LAB — GLUCOSE, CAPILLARY
Glucose-Capillary: 121 mg/dL — ABNORMAL HIGH (ref 70–99)
Glucose-Capillary: 137 mg/dL — ABNORMAL HIGH (ref 70–99)
Glucose-Capillary: 119 mg/dL — ABNORMAL HIGH (ref 70–99)
Glucose-Capillary: 132 mg/dL — ABNORMAL HIGH (ref 70–99)

## 2018-03-10 LAB — CORTISOL-AM, BLOOD: CORTISOL - AM: 11.5 ug/dL (ref 6.7–22.6)

## 2018-03-10 LAB — PROCALCITONIN: Procalcitonin: 1.18 ng/mL

## 2018-03-10 MED ORDER — POTASSIUM CHLORIDE CRYS ER 20 MEQ PO TBCR
40.0000 meq | EXTENDED_RELEASE_TABLET | Freq: Once | ORAL | Status: AC
  Administered 2018-03-10: 40 meq via ORAL
  Filled 2018-03-10: qty 2

## 2018-03-10 NOTE — Progress Notes (Signed)
[place] at [facility]                                                                                                                                                                                  Patient Demographics   Robert [name], is a 73 y.o. male, DOB - [birth year], VPX:[id number]  Admit date - 03/09/2018   Admitting Physician Nicholes Mango, MD  Outpatient Primary MD for the patient is Cletis Athens, MD   LOS - 1  Subjective: Patient states that he is feeling little stronger.  Currently not having dizziness    Review of Systems:   CONSTITUTIONAL: No documented fever.  Positive fatigue, positive weakness. No weight gain, no weight loss.  EYES: No blurry or double vision.  ENT: No tinnitus. No postnasal drip. No redness of the oropharynx.  RESPIRATORY: No cough, no wheeze, no hemoptysis. No dyspnea.  CARDIOVASCULAR: No chest pain. No orthopnea. No palpitations. No syncope.  GASTROINTESTINAL: No nausea, no vomiting or diarrhea. No abdominal pain. No melena or hematochezia.  GENITOURINARY: No dysuria or hematuria.  ENDOCRINE: No polyuria or nocturia. No heat or cold intolerance.  HEMATOLOGY: No anemia. No bruising. No bleeding.  INTEGUMENTARY: No rashes. No lesions.  MUSCULOSKELETAL: No arthritis. No swelling. No gout.  NEUROLOGIC: No numbness, tingling, or ataxia. No seizure-type activity.  PSYCHIATRIC: No anxiety. No insomnia. No ADD.    Vitals:   Vitals:   03/09/18 1956 03/10/18 0534 03/10/18 0803 03/10/18 1115  BP: 135/78 116/65 134/67 (!) 107/55  Pulse: 95 76 81 79  Resp: 20 (!) 21  17  Temp: 98.3 F (36.8 C) 99 F (37.2 C)  99 F (37.2 C)  TempSrc: Oral Oral  Oral  SpO2: 92% 99%  92%  Weight:      Height:        Wt Readings from Last 3 Encounters:  03/09/18 (!) 163.3 kg  10/02/17 (!) 158.8 kg  05/09/17 (!) 166.5 kg     Intake/Output Summary (Last 24 hours) at 03/10/2018 1153 Last data filed at 03/10/2018 1052 Gross per  24 hour  Intake 2269.34 ml  Output 700 ml  Net 1569.34 ml    Physical Exam:   GENERAL: Pleasant-appearing in no apparent distress.  HEAD, EYES, EARS, NOSE AND THROAT: Atraumatic, normocephalic. Extraocular muscles are intact. Pupils equal and reactive to light. Sclerae anicteric. No conjunctival injection. No oro-pharyngeal erythema.  NECK: Supple. There is no jugular venous distention. No bruits, no lymphadenopathy, no thyromegaly.  HEART: Regular rate and rhythm,. No murmurs, no rubs, no clicks.  LUNGS: Clear to auscultation bilaterally. No rales or rhonchi. No wheezes.  ABDOMEN: Soft, flat, nontender, nondistended. Has good  bowel sounds. No hepatosplenomegaly appreciated.  EXTREMITIES: No evidence of any cyanosis, clubbing, or peripheral edema.  +2 pedal and radial pulses bilaterally.  NEUROLOGIC: The patient is alert, awake, and oriented x3 with no focal motor or sensory deficits appreciated bilaterally.  SKIN: Moist and warm with no rashes appreciated.  Psych: Not anxious, depressed LN: No inguinal LN enlargement    Antibiotics   Anti-infectives (From admission, onward)   Start     Dose/Rate Route Frequency Ordered Stop   03/10/18 1700  cefTRIAXone (ROCEPHIN) 1 g in sodium chloride 0.9 % 100 mL IVPB     1 g 200 mL/hr over 30 Minutes Intravenous Every 24 hours 03/09/18 1817     03/09/18 1645  cefTRIAXone (ROCEPHIN) 1 g in sodium chloride 0.9 % 100 mL IVPB     1 g 200 mL/hr over 30 Minutes Intravenous  Once 03/09/18 1644 03/09/18 1739      Medications   Scheduled Meds: . amLODipine  10 mg Oral Daily  . aspirin EC  81 mg Oral Daily  . cloNIDine  0.2 mg Oral BID  . clopidogrel  75 mg Oral Daily  . DULoxetine  30 mg Oral Daily  . enoxaparin (LOVENOX) injection  40 mg Subcutaneous Q12H  . insulin aspart  0-5 Units Subcutaneous QHS  . insulin aspart  0-9 Units Subcutaneous TID WC  . insulin glargine  50 Units Subcutaneous AC breakfast  . timolol  1 drop Both Eyes QHS   Or   . latanoprost  1 drop Both Eyes QHS  . metoprolol succinate  100 mg Oral Daily  . simvastatin  20 mg Oral q1800   Continuous Infusions: . sodium chloride 75 mL/hr at 03/10/18 1052  . cefTRIAXone (ROCEPHIN)  IV     PRN Meds:.acetaminophen **OR** acetaminophen, docusate sodium, loratadine, meclizine, metoprolol tartrate, ondansetron **OR** ondansetron (ZOFRAN) IV   Data Review:   Micro Results Recent Results (from the past 240 hour(s))  Blood Culture (routine x 2)     Status: None (Preliminary result)   Collection Time: 03/09/18  3:56 PM  Result Value Ref Range Status   Specimen Description BLOOD LEFT ANTECUBITAL  Final   Special Requests   Final    BOTTLES DRAWN AEROBIC AND ANAEROBIC Blood Culture adequate volume   Culture   Final    NO GROWTH < 24 HOURS Performed at [facility], [address] [place], [place] [address]    Report Status PENDING  Incomplete  Blood Culture (routine x 2)     Status: None (Preliminary result)   Collection Time: 03/09/18  3:56 PM  Result Value Ref Range Status   Specimen Description BLOOD BLOOD RIGHT FOREARM  Final   Special Requests   Final    BOTTLES DRAWN AEROBIC AND ANAEROBIC Blood Culture results may not be optimal due to an excessive volume of blood received in culture bottles   Culture   Final    NO GROWTH < 24 HOURS Performed at [facility], [address], [place], [place] [address]    Report Status PENDING  Incomplete    Radiology Reports Dg Chest Portable 1 View  Result Date: 03/09/2018 CLINICAL DATA:  Fever.  Dizziness for 2 days. EXAM: PORTABLE CHEST 1 VIEW COMPARISON:  November 28, 2016 FINDINGS: Stable cardiomegaly. The hila, mediastinum, lungs, and pleura are otherwise unremarkable. IMPRESSION: No active disease. Electronically Signed   By: Dorise Bullion III M.D   On: 03/09/2018 17:04     CBC Recent Labs  Lab 03/09/18 1556 03/10/18 0415  WBC 13.6* 9.8  HGB 13.5 11.6*  HCT 39.8 35.1*  PLT 212 182   MCV 95.2 96.4  MCH 32.3 31.9  MCHC 33.9 33.0  RDW 14.6 14.6  LYMPHSABS 1.4  --   MONOABS 1.2*  --   EOSABS 0.0  --   BASOSABS 0.0  --     Chemistries  Recent Labs  Lab 03/09/18 1556 03/10/18 0415  NA 136 136  K 3.2* 3.2*  CL 95* 100  CO2 28 27  GLUCOSE 181* 140*  BUN 24* 22  CREATININE 1.76* 1.67*  CALCIUM 8.7* 8.0*  AST 21 17  ALT 20 17  ALKPHOS 71 54  BILITOT 0.9 0.8   ------------------------------------------------------------------------------------------------------------------ estimated creatinine clearance is 64.6 mL/min (A) (by C-G formula based on SCr of 1.67 mg/dL (H)). ------------------------------------------------------------------------------------------------------------------ No results for input(s): HGBA1C in the last 72 hours. ------------------------------------------------------------------------------------------------------------------ No results for input(s): CHOL, HDL, LDLCALC, TRIG, CHOLHDL, LDLDIRECT in the last 72 hours. ------------------------------------------------------------------------------------------------------------------ No results for input(s): TSH, T4TOTAL, T3FREE, THYROIDAB in the last 72 hours.  Invalid input(s): FREET3 ------------------------------------------------------------------------------------------------------------------ No results for input(s): VITAMINB12, FOLATE, FERRITIN, TIBC, IRON, RETICCTPCT in the last 72 hours.  Coagulation profile Recent Labs  Lab 03/10/18 0415  INR 1.16    No results for input(s): DDIMER in the last 72 hours.  Cardiac Enzymes No results for input(s): CKMB, TROPONINI, MYOGLOBIN in the last 168 hours.  Invalid input(s): CK ------------------------------------------------------------------------------------------------------------------ Invalid input(s): [place]  Patient 73 year old presenting with weakness and dizziness and fever    #sepsis secondary  to recurrent UTI Continue IV Rocephin Follow urine cultures  #Recurrent UTI IV Rocephin, IV fluids  Patient is a male and should not be having recurrent urinary tract infections will obtain a renal ultrasound to further evaluate any urological pathology   #Dizziness from vertigo probably benign paroxysmal positional vertigo Meclizine as needed We will do orthostatic blood pressure check Outpatient ENT follow-up   #Insulin requiring diabetes mellitus Continue Lantus 50 units and hold other p.o. medications Sliding scale insulin   #AKI-could be prerenal or patient may have chronic kidney disease Baseline renal function not available I Continue to hold Lasix and lisinopril  #Essential hypertension Continue home medication Toprol, amlodipine and clonidine Hold Lasix, lisinopril and hydrochlorothiazide in view of AKI      Code Status Orders  (From admission, onward)         Start     Ordered   03/09/18 1818  Full code  Continuous     03/09/18 1817        Code Status History    This patient has a current code status but no historical code status.           Consults none  DVT Prophylaxis  Lovenox   Lab Results  Component Value Date   PLT 182 03/10/2018     Time Spent in minutes 35 minutes  Greater than 50% of time spent in care coordination and counseling patient regarding the condition and plan of care.   Dustin Flock M.D on 03/10/2018 at 11:53 AM  Between 7am to 6pm - Pager - [phone]  After 6pm go to [email or web address] - Proofreader  [employer]   Office  [phone]

## 2018-03-10 NOTE — Evaluation (Signed)
Physical Therapy Evaluation Patient Details Name: Robert [name] MRN: [id number] DOB: Jun 09, 1945 Today's Date: 03/10/2018   History of Present Illness  Patient is a pleasant 73 y/o male that presents with fever, dizziness, and LE weakness. He was found to have a UTI and was noted to be septic.   Clinical Impression  Patient is a pleasant 73 y/o male that presents with complaints of dizziness and LE weakness. He describes the dizziness as when he moves his head and does not last for very long. He notes it has greatly decreased, but was recommended to follow up with ENT and/or vestibular therapist should he experience this again. He was noted to require very minimal assistance for bed mobility and transfers and was able to ambulate a household distance with RW and no loss of balance. He is not quite at his baseline mobility, where he was using a SPC to ambulate in the community. He would likely benefit from HHPT when he is appropriate for discharge.     Follow Up Recommendations Home health PT    Equipment Recommendations  Rolling walker with 5" wheels;3in1 (PT)    Recommendations for Other Services       Precautions / Restrictions Precautions Precautions: Fall Restrictions Weight Bearing Restrictions: No      Mobility  Bed Mobility Overal bed mobility: Modified Independent             General bed mobility comments: Patient is able to transfer with HOB elevated.   Transfers Overall transfer level: Needs assistance Equipment used: Rolling walker (2 wheeled) Transfers: Sit to/from Stand Sit to Stand: Min guard;Min assist         General transfer comment: Patient requires use of LUE to assist in transfers.   Ambulation/Gait Ambulation/Gait assistance: Min guard Gait Distance (Feet): 200 Feet Assistive device: Rolling walker (2 wheeled) Gait Pattern/deviations: WFL(Within Functional Limits)   Gait velocity interpretation: <1.8 ft/sec, indicate of risk for  recurrent falls General Gait Details: Patient is able to ambulate through hallway with RW, no loss of balance. He did note one episode of "dizziness" with a head turn too quickly.   Stairs            Wheelchair Mobility    Modified Rankin (Stroke Patients Only)       Balance Overall balance assessment: Needs assistance Sitting-balance support: Feet supported Sitting balance-[name]: Good     Standing balance support: Bilateral upper extremity supported Standing balance-[name]: Good                               Pertinent Vitals/Pain Pain Assessment: No/denies pain    Home Living Family/patient expects to be discharged to:: Private residence Living Arrangements: Spouse/significant other Available Help at Discharge: Family;Available 24 hours/day Type of Home: House Home Access: Stairs to enter   [employer] of Steps: 1 Home Layout: One level Home Equipment: Cane - single point      Prior Function Level of Independence: Independent with assistive device(s)               Hand Dominance        Extremity/Trunk Assessment   Upper Extremity Assessment Upper Extremity Assessment: Overall WFL for tasks assessed    Lower Extremity Assessment Lower Extremity Assessment: Overall WFL for tasks assessed       Communication   Communication: No difficulties  Cognition Arousal/Alertness: Awake/alert Behavior During Therapy: WFL for tasks assessed/performed Overall Cognitive  Status: Within Functional Limits for tasks assessed                                        General Comments      Exercises     Assessment/Plan    PT Assessment Patient needs continued PT services  PT Problem List Decreased strength;Decreased balance;Decreased mobility;Decreased knowledge of use of DME;Decreased activity tolerance;Cardiopulmonary status limiting activity       PT Treatment Interventions DME instruction;Functional  mobility training;Balance training;Patient/family education;Gait training;Therapeutic activities;Neuromuscular re-education;Stair training;Therapeutic exercise    PT Goals (Current goals can be found in the Care Plan section)  Acute Rehab PT Goals Patient Stated Goal: To return home safely  PT Goal Formulation: With patient/family Time For Goal Achievement: 03/24/18 Potential to Achieve Goals: Good    Frequency Min 2X/week   Barriers to discharge        Co-evaluation               AM-PAC PT "6 Clicks" Mobility  Outcome Measure Help needed turning from your back to your side while in a flat bed without using bedrails?: None Help needed moving from lying on your back to sitting on the side of a flat bed without using bedrails?: None Help needed moving to and from a bed to a chair (including a wheelchair)?: A Little Help needed standing up from a chair using your arms (e.g., wheelchair or bedside chair)?: A Little Help needed to walk in hospital room?: A Little Help needed climbing 3-5 steps with a railing? : A Little 6 Click Score: 20    End of Session Equipment Utilized During Treatment: Gait belt Activity Tolerance: Patient tolerated treatment well Patient left: in chair;with call bell/phone within reach;with chair alarm set;with family/visitor present Nurse Communication: Mobility status PT Visit Diagnosis: Muscle weakness (generalized) (M62.81)    Time: 6759-1638 PT Time Calculation (min) (ACUTE ONLY): 30 min   Charges:   PT Evaluation $PT Eval Moderate Complexity: 1 Mod          Royce Macadamia PT, DPT, CSCS   03/10/2018, 2:05 PM

## 2018-03-11 ENCOUNTER — Inpatient Hospital Stay: Payer: Medicare Other

## 2018-03-11 LAB — CBC
HCT: 41.4 % (ref 39.0–52.0)
Hemoglobin: 13.4 g/dL (ref 13.0–17.0)
MCH: 32 pg (ref 26.0–34.0)
MCHC: 32.4 g/dL (ref 30.0–36.0)
MCV: 98.8 fL (ref 80.0–100.0)
PLATELETS: 177 10*3/uL (ref 150–400)
RBC: 4.19 MIL/uL — ABNORMAL LOW (ref 4.22–5.81)
RDW: 14.6 % (ref 11.5–15.5)
WBC: 8.9 10*3/uL (ref 4.0–10.5)
nRBC: 0 % (ref 0.0–0.2)

## 2018-03-11 LAB — BASIC METABOLIC PANEL
Anion gap: 9 (ref 5–15)
BUN: 25 mg/dL — ABNORMAL HIGH (ref 8–23)
CO2: 24 mmol/L (ref 22–32)
Calcium: 8.1 mg/dL — ABNORMAL LOW (ref 8.9–10.3)
Chloride: 105 mmol/L (ref 98–111)
Creatinine, Ser: 1.55 mg/dL — ABNORMAL HIGH (ref 0.61–1.24)
GFR calc Af Amer: 51 mL/min — ABNORMAL LOW (ref >60)
GFR calc non Af Amer: 44 mL/min — ABNORMAL LOW (ref >60)
GLUCOSE: 130 mg/dL — AB (ref 70–99)
Potassium: 4.1 mmol/L (ref 3.5–5.1)
Sodium: 138 mmol/L (ref 135–145)

## 2018-03-11 LAB — GLUCOSE, CAPILLARY
Glucose-Capillary: 114 mg/dL — ABNORMAL HIGH (ref 70–99)
Glucose-Capillary: 172 mg/dL — ABNORMAL HIGH (ref 70–99)
Glucose-Capillary: 156 mg/dL — ABNORMAL HIGH (ref 70–99)
Glucose-Capillary: 132 mg/dL — ABNORMAL HIGH (ref 70–99)

## 2018-03-11 MED ORDER — SODIUM CHLORIDE 0.9 % IV SOLN
INTRAVENOUS | Status: DC | PRN
  Administered 2018-03-11: 1000 mL via INTRAVENOUS

## 2018-03-11 NOTE — Progress Notes (Signed)
Physical Therapy Treatment Patient Details Name: Robert [name] MRN: [id number] DOB: 1945/06/16 Today's Date: 03/11/2018    History of Present Illness Patient is a pleasant 73 y/o male that presents with fever, dizziness, and LE weakness. He was found to have a UTI and was noted to be septic.     PT Comments    Pt presents with minor deficits in strength, transfers, gait, and balance, and with moderate deficits in activity tolerance but overall is progressing well towards goals.  Pt was able to complete all bed mobility tasks without physical assistance and with improved speed and effort including not needed the [facility] to be elevated during sup to sit.  Pt was able to amb 200' with a RW with fair cadence with no LOB with min-mod SOB.  After amb the pt's SpO2 was 91% and HR 98 bpm compared to baselines of 94% and 82 bpm.  Pt will benefit from HHPT services upon discharge to safely address above deficits for decreased caregiver assistance and eventual return to PLOF.     Follow Up Recommendations  Home health PT     Equipment Recommendations  Rolling walker with 5" wheels;3in1 (PT);Other (comment)(Bariatric RW/BSC)    Recommendations for Other Services       Precautions / Restrictions Precautions Precautions: Fall Restrictions Weight Bearing Restrictions: No    Mobility  Bed Mobility Overal bed mobility: Modified Independent             General bed mobility comments: Extra time and effort required but no physical assistance needed  Transfers Overall transfer level: Needs assistance Equipment used: Rolling walker (2 wheeled) Transfers: Sit to/from Stand Sit to Stand: Min guard;From elevated surface         General transfer comment: Good concentric and fair eccentric control during transfers with cues for proper hand placement   Ambulation/Gait Ambulation/Gait assistance: Min guard Gait Distance (Feet): 200 Feet Assistive device: Rolling walker (2 wheeled) Gait  Pattern/deviations: Step-through pattern;Decreased step length - right;Decreased step length - left Gait velocity: Decreased   General Gait Details: Fair cadence with no LOB during amb with pt min-mod SOB after amb with SpO2 91% and HR 98 bpm compared to baselines of 94% and 82 bpm.   Stairs             Wheelchair Mobility    Modified Rankin (Stroke Patients Only)       Balance Overall balance assessment: Needs assistance Sitting-balance support: Feet supported Sitting balance-[name]: Good     Standing balance support: Bilateral upper extremity supported Standing balance-[name]: Good                              Cognition Arousal/Alertness: Awake/alert Behavior During Therapy: WFL for tasks assessed/performed Overall Cognitive Status: Within Functional Limits for tasks assessed                                        Exercises Total Joint Exercises Ankle Circles/Pumps: AROM;Both;5 reps;10 reps Quad Sets: Strengthening;Both;5 reps;10 reps Gluteal Sets: Strengthening;Both;5 reps;10 reps Long Arc Quad: AROM;Both;10 reps;15 reps Knee Flexion: AROM;Both;10 reps;15 reps Marching in Standing: AROM;Both;10 reps;Seated;Standing Other Exercises Other Exercises: Sit to/from stand training from multiple height surfaces with cues for sequencing    General Comments        Pertinent Vitals/Pain Pain Assessment: No/denies pain  Home Living                      Prior Function            PT Goals (current goals can now be found in the care plan section) Progress towards PT goals: Progressing toward goals    Frequency    Min 2X/week      PT Plan Current plan remains appropriate    Co-evaluation              AM-PAC PT "6 Clicks" Mobility   Outcome Measure  Help needed turning from your back to your side while in a flat bed without using bedrails?: None Help needed moving from lying on your back to  sitting on the side of a flat bed without using bedrails?: None Help needed moving to and from a bed to a chair (including a wheelchair)?: A Little Help needed standing up from a chair using your arms (e.g., wheelchair or bedside chair)?: A Little Help needed to walk in hospital room?: A Little Help needed climbing 3-5 steps with a railing? : A Little 6 Click Score: 20    End of Session Equipment Utilized During Treatment: Gait belt Activity Tolerance: Patient tolerated treatment well Patient left: in chair;with call bell/phone within reach;with chair alarm set Nurse Communication: Mobility status PT Visit Diagnosis: Muscle weakness (generalized) (M62.81)     Time: 7493-5521 PT Time Calculation (min) (ACUTE ONLY): 28 min  Charges:  $Gait Training: 8-22 mins $Therapeutic Exercise: 8-22 mins                     D. Scott Chancellor Vanderloop PT, DPT 03/11/18, 4:00 PM

## 2018-03-11 NOTE — Progress Notes (Signed)
[place] at [facility]                                                                                                                                                                                  Patient Demographics   Robert [name], is a 73 y.o. male, DOB - [birth year], FWY:[id number]  Admit date - 03/09/2018   Admitting Physician Nicholes Mango, MD  Outpatient Primary MD for the patient is Cletis Athens, MD   LOS - 2  Subjective: Patient states that he is not feeling dizzy but very weak.  Review of Systems:   CONSTITUTIONAL: No documented fever.  Positive fatigue, positive weakness. No weight gain, no weight loss.  EYES: No blurry or double vision.  ENT: No tinnitus. No postnasal drip. No redness of the oropharynx.  RESPIRATORY: No cough, no wheeze, no hemoptysis. No dyspnea.  CARDIOVASCULAR: No chest pain. No orthopnea. No palpitations. No syncope.  GASTROINTESTINAL: No nausea, no vomiting or diarrhea. No abdominal pain. No melena or hematochezia.  GENITOURINARY: No dysuria or hematuria.  ENDOCRINE: No polyuria or nocturia. No heat or cold intolerance.  HEMATOLOGY: No anemia. No bruising. No bleeding.  INTEGUMENTARY: No rashes. No lesions.  MUSCULOSKELETAL: No arthritis. No swelling. No gout.  NEUROLOGIC: No numbness, tingling, or ataxia. No seizure-type activity.  PSYCHIATRIC: No anxiety. No insomnia. No ADD.    Vitals:   Vitals:   03/10/18 0803 03/10/18 1115 03/10/18 2153 03/11/18 0505  BP: 134/67 (!) 107/55 118/62 140/86  Pulse: 81 79 67 86  Resp:  17 19 20   Temp:  99 F (37.2 C) 97.7 F (36.5 C) 98.9 F (37.2 C)  TempSrc:  Oral Oral Oral  SpO2:  92% 99% 92%  Weight:      Height:        Wt Readings from Last 3 Encounters:  03/09/18 (!) 163.3 kg  10/02/17 (!) 158.8 kg  05/09/17 (!) 166.5 kg     Intake/Output Summary (Last 24 hours) at 03/11/2018 1112 Last data filed at 03/11/2018 1002 Gross per 24 hour  Intake 937.38 ml   Output 450 ml  Net 487.38 ml    Physical Exam:   GENERAL: Pleasant-appearing in no apparent distress.  HEAD, EYES, EARS, NOSE AND THROAT: Atraumatic, normocephalic. Extraocular muscles are intact. Pupils equal and reactive to light. Sclerae anicteric. No conjunctival injection. No oro-pharyngeal erythema.  NECK: Supple. There is no jugular venous distention. No bruits, no lymphadenopathy, no thyromegaly.  HEART: Regular rate and rhythm,. No murmurs, no rubs, no clicks.  LUNGS: Clear to auscultation bilaterally. No rales or rhonchi. No wheezes.  ABDOMEN: Soft, flat, nontender, nondistended. Has good bowel sounds. No hepatosplenomegaly appreciated.  EXTREMITIES: No evidence of any cyanosis, clubbing, or peripheral edema.  +2 pedal and radial pulses bilaterally.  NEUROLOGIC: The patient is alert, awake, and oriented x3 with no focal motor or sensory deficits appreciated bilaterally.  SKIN: Moist and warm with no rashes appreciated.  Psych: Not anxious, depressed LN: No inguinal LN enlargement    Antibiotics   Anti-infectives (From admission, onward)   Start     Dose/Rate Route Frequency Ordered Stop   03/10/18 1700  cefTRIAXone (ROCEPHIN) 1 g in sodium chloride 0.9 % 100 mL IVPB     1 g 200 mL/hr over 30 Minutes Intravenous Every 24 hours 03/09/18 1817     03/09/18 1645  cefTRIAXone (ROCEPHIN) 1 g in sodium chloride 0.9 % 100 mL IVPB     1 g 200 mL/hr over 30 Minutes Intravenous  Once 03/09/18 1644 03/09/18 1739      Medications   Scheduled Meds: . amLODipine  10 mg Oral Daily  . aspirin EC  81 mg Oral Daily  . cloNIDine  0.2 mg Oral BID  . clopidogrel  75 mg Oral Daily  . DULoxetine  30 mg Oral Daily  . enoxaparin (LOVENOX) injection  40 mg Subcutaneous Q12H  . insulin aspart  0-5 Units Subcutaneous QHS  . insulin aspart  0-9 Units Subcutaneous TID WC  . insulin glargine  50 Units Subcutaneous AC breakfast  . timolol  1 drop Both Eyes QHS   Or  . latanoprost  1 drop Both  Eyes QHS  . metoprolol succinate  100 mg Oral Daily  . simvastatin  20 mg Oral q1800   Continuous Infusions: . sodium chloride 1,000 mL (03/11/18 0925)  . cefTRIAXone (ROCEPHIN)  IV Stopped (03/10/18 1701)   PRN Meds:.sodium chloride, acetaminophen **OR** acetaminophen, docusate sodium, loratadine, meclizine, metoprolol tartrate, ondansetron **OR** ondansetron (ZOFRAN) IV   Data Review:   Micro Results Recent Results (from the past 240 hour(s))  Blood Culture (routine x 2)     Status: None (Preliminary result)   Collection Time: 03/09/18  3:56 PM  Result Value Ref Range Status   Specimen Description BLOOD LEFT ANTECUBITAL  Final   Special Requests   Final    BOTTLES DRAWN AEROBIC AND ANAEROBIC Blood Culture adequate volume   Culture   Final    NO GROWTH 2 DAYS Performed at [facility], [address], [place], [place] [address]    Report Status PENDING  Incomplete  Blood Culture (routine x 2)     Status: None (Preliminary result)   Collection Time: 03/09/18  3:56 PM  Result Value Ref Range Status   Specimen Description BLOOD BLOOD RIGHT FOREARM  Final   Special Requests   Final    BOTTLES DRAWN AEROBIC AND ANAEROBIC Blood Culture results may not be optimal due to an excessive volume of blood received in culture bottles   Culture   Final    NO GROWTH 2 DAYS Performed at [facility], [address] [place], [place] [address]    Report Status PENDING  Incomplete  Urine culture     Status: Abnormal (Preliminary result)   Collection Time: 03/09/18  3:56 PM  Result Value Ref Range Status   Specimen Description URINE, CLEAN CATCH  Final   Special Requests   Final    NONE Performed at [facility], [address] [place], [place] [address]    Culture >=100,000 COLONIES/mL ESCHERICHIA COLI (A)  Final   Report Status PENDING  Incomplete  Radiology Reports US Renal  Result Date: 03/11/2018 CLINICAL DATA:  Recurrent urinary tract infections.  EXAM: RENAL / URINARY TRACT ULTRASOUND COMPLETE COMPARISON:  CT of the abdomen and pelvis on 06/04/2008 FINDINGS: Right Kidney: Renal measurements: 13.9 x 5.4 x 5.4 cm = volume: 210 mL . Echogenicity within normal limits. No solid mass or hydronephrosis visualized. Small 1.5 cm cyst of the interpolar cortex. Left Kidney: Renal measurements: 11.4 x 5.4 x 6.1 cm = volume: 196 mL. Echogenicity within normal limits. No mass or hydronephrosis visualized. Bladder: Appears normal for degree of bladder distention. IMPRESSION: Normal sized kidneys without evidence of obstruction or mass. Simple appearing cyst of the right kidney. Electronically Signed   By: Aletta Edouard M.D.   On: 03/11/2018 10:26   Dg Chest Portable 1 View  Result Date: 03/09/2018 CLINICAL DATA:  Fever.  Dizziness for 2 days. EXAM: PORTABLE CHEST 1 VIEW COMPARISON:  November 28, 2016 FINDINGS: Stable cardiomegaly. The hila, mediastinum, lungs, and pleura are otherwise unremarkable. IMPRESSION: No active disease. Electronically Signed   By: Dorise Bullion III M.D   On: 03/09/2018 17:04     CBC Recent Labs  Lab 03/09/18 1556 03/10/18 0415 03/11/18 0556  WBC 13.6* 9.8 8.9  HGB 13.5 11.6* 13.4  HCT 39.8 35.1* 41.4  PLT 212 182 177  MCV 95.2 96.4 98.8  MCH 32.3 31.9 32.0  MCHC 33.9 33.0 32.4  RDW 14.6 14.6 14.6  LYMPHSABS 1.4  --   --   MONOABS 1.2*  --   --   EOSABS 0.0  --   --   BASOSABS 0.0  --   --     Chemistries  Recent Labs  Lab 03/09/18 1556 03/10/18 0415 03/11/18 0556  NA 136 136 138  K 3.2* 3.2* 4.1  CL 95* 100 105  CO2 28 27 24   GLUCOSE 181* 140* 130*  BUN 24* 22 25*  CREATININE 1.76* 1.67* 1.55*  CALCIUM 8.7* 8.0* 8.1*  AST 21 17  --   ALT 20 17  --   ALKPHOS 71 54  --   BILITOT 0.9 0.8  --    ------------------------------------------------------------------------------------------------------------------ estimated creatinine clearance is 69.6 mL/min (A) (by C-G formula based on SCr of 1.55 mg/dL  (H)). ------------------------------------------------------------------------------------------------------------------ No results for input(s): HGBA1C in the last 72 hours. ------------------------------------------------------------------------------------------------------------------ No results for input(s): CHOL, HDL, LDLCALC, TRIG, CHOLHDL, LDLDIRECT in the last 72 hours. ------------------------------------------------------------------------------------------------------------------ No results for input(s): TSH, T4TOTAL, T3FREE, THYROIDAB in the last 72 hours.  Invalid input(s): FREET3 ------------------------------------------------------------------------------------------------------------------ No results for input(s): VITAMINB12, FOLATE, FERRITIN, TIBC, IRON, RETICCTPCT in the last 72 hours.  Coagulation profile Recent Labs  Lab 03/10/18 0415  INR 1.16    No results for input(s): DDIMER in the last 72 hours.  Cardiac Enzymes No results for input(s): CKMB, TROPONINI, MYOGLOBIN in the last 168 hours.  Invalid input(s): CK ------------------------------------------------------------------------------------------------------------------ Invalid input(s): [place]  Patient 73 year old presenting with weakness and dizziness and fever    #sepsis secondary to recurrent UTI Continue IV Rocephin  urine culture showing E. coli sensitivities pending  #Recurrent UTI IV Rocephin, IV fluids  Patient is a male and should not be having recurrent urinary tract infections Renal ultrasound reviewed showed no urological pathology   #Dizziness from vertigo probably benign paroxysmal positional vertigo Meclizine as needed Now resolved   #Insulin requiring diabetes mellitus Continue Lantus 50 units and hold other p.o. medications Sliding scale insulin Glucose stable  #AKI-could be prerenal or patient may have  chronic kidney disease Baseline renal  function not available I Improved  #Essential hypertension Continue home medication Toprol, amlodipine and clonidine Hold Lasix, lisinopril and hydrochlorothiazide in view of AKI      Code Status Orders  (From admission, onward)         Start     Ordered   03/09/18 1818  Full code  Continuous     03/09/18 1817        Code Status History    This patient has a current code status but no historical code status.           Consults none  DVT Prophylaxis  Lovenox   Lab Results  Component Value Date   PLT 177 03/11/2018     Time Spent in minutes 35 minutes  Greater than 50% of time spent in care coordination and counseling patient regarding the condition and plan of care.   Dustin Flock M.D on 03/11/2018 at 11:12 AM  Between 7am to 6pm - Pager - [phone]  After 6pm go to [email or web address] - Proofreader  [employer]   Office  [phone]

## 2018-03-11 NOTE — Care Management Note (Signed)
Case Management Note  Patient Details  Name: MONTEL VANDERHOOF MRN: [id number] Date of Birth: 11-21-1945   Patient admitted with recurrent UTI.  Patient lives at home with wife.  PCP [facility]. Patient states wife provides transportation. Pharmacy [facility]. Patient denies any issues obtaining his medications.  PT has assessed patient and recommends home health PT, RW, and BSC.  Patient agreeable to home health services. CMS Medicare.gov Compare Post Acute Care list reviewed with patient. Patient states he does not have a preference of home health agency.  Heads up referral made to [facility] with [facility] for home health and DME  Subjective/Objective:                    Action/Plan:   Expected Discharge Date:  03/11/18               Expected Discharge Plan:  [place]  In-House Referral:     Discharge planning Services  CM Consult  Post Acute Care Choice:  Durable Medical Equipment, Home Health Choice offered to:  Patient  DME Arranged:  3-N-1, Walker rolling DME Agency:  [place]:  PT [facility] Agency:     Status of Service:  In process, will continue to follow  If discussed at Long Length of Stay Meetings, dates discussed:    Additional Comments:  Beverly Sessions, RN 03/11/2018, 2:24 PM

## 2018-03-12 LAB — CBC
HCT: 35.7 % — ABNORMAL LOW (ref 39.0–52.0)
Hemoglobin: 11.7 g/dL — ABNORMAL LOW (ref 13.0–17.0)
MCH: 31.6 pg (ref 26.0–34.0)
MCHC: 32.8 g/dL (ref 30.0–36.0)
MCV: 96.5 fL (ref 80.0–100.0)
Platelets: 192 10*3/uL (ref 150–400)
RBC: 3.7 MIL/uL — ABNORMAL LOW (ref 4.22–5.81)
RDW: 14.1 % (ref 11.5–15.5)
WBC: 11.2 10*3/uL — ABNORMAL HIGH (ref 4.0–10.5)
nRBC: 0 % (ref 0.0–0.2)

## 2018-03-12 LAB — BASIC METABOLIC PANEL
Anion gap: 6 (ref 5–15)
BUN: 24 mg/dL — ABNORMAL HIGH (ref 8–23)
CO2: 27 mmol/L (ref 22–32)
Calcium: 8.2 mg/dL — ABNORMAL LOW (ref 8.9–10.3)
Chloride: 103 mmol/L (ref 98–111)
Creatinine, Ser: 1.56 mg/dL — ABNORMAL HIGH (ref 0.61–1.24)
GFR calc Af Amer: 50 mL/min — ABNORMAL LOW (ref >60)
GFR calc non Af Amer: 43 mL/min — ABNORMAL LOW (ref >60)
Glucose, Bld: 140 mg/dL — ABNORMAL HIGH (ref 70–99)
Potassium: 3.8 mmol/L (ref 3.5–5.1)
Sodium: 136 mmol/L (ref 135–145)

## 2018-03-12 LAB — URINE CULTURE: Culture: >=100000 — AB

## 2018-03-12 LAB — GLUCOSE, CAPILLARY
Glucose-Capillary: 135 mg/dL — ABNORMAL HIGH (ref 70–99)
Glucose-Capillary: 150 mg/dL — ABNORMAL HIGH (ref 70–99)

## 2018-03-12 MED ORDER — CEPHALEXIN 500 MG PO CAPS: 500.0000 mg | ORAL_CAPSULE | Freq: Three times a day (TID) | ORAL | 0 refills | Status: AC

## 2018-03-12 NOTE — Care Management Important Message (Signed)
Copy of signed Medicare IM left with patient in room. 

## 2018-03-12 NOTE — Progress Notes (Signed)
03/12/2018 4:46 PM  Robert [name] to be D/C'd Home per MD order.  Discussed prescriptions and follow up appointments with the patient. Prescriptions given to patient, medication list explained in detail. Pt verbalized understanding.  Allergies as of 03/12/2018   No Known Allergies     Medication List    TAKE these medications   amLODipine 10 MG tablet Commonly known as:  NORVASC Take 10 mg by mouth daily.   aspirin EC 81 MG tablet Take 81 mg by mouth daily.   cephALEXin 500 MG capsule Commonly known as:  KEFLEX Take 1 capsule (500 mg total) by mouth 3 (three) times daily for 6 days. Notes to patient:  Antibiotics go better with food, reduces chance of stomach upset   cloNIDine 0.2 MG tablet Commonly known as:  CATAPRES Take 0.2 mg by mouth daily.   clopidogrel 75 MG tablet Commonly known as:  PLAVIX Take 75 mg by mouth daily.   DULoxetine 30 MG capsule Commonly known as:  CYMBALTA Take 30 mg by mouth daily.   furosemide 40 MG tablet Commonly known as:  LASIX Take 40 mg by mouth daily.   hydrochlorothiazide 25 MG tablet Commonly known as:  HYDRODIURIL Take 25 mg by mouth daily.   JANUVIA 100 MG tablet Generic drug:  sitaGLIPtin Take 100 mg by mouth daily.   LANTUS SOLOSTAR 100 UNIT/ML Solostar Pen Generic drug:  Insulin Glargine Inject 55 Units into the skin daily.   Latanoprost-Timolol Maleate 0.005-0.5 % Soln INSTILL ONE DROP TO BOTH EYES AT BEDTIME   lisinopril 5 MG tablet Commonly known as:  PRINIVIL,ZESTRIL Take 5 mg by mouth daily.   loratadine 10 MG tablet Commonly known as:  CLARITIN Take 10 mg by mouth daily as needed for allergies.   meclizine 12.5 MG tablet Commonly known as:  ANTIVERT Take 12.5 mg by mouth 2 (two) times daily as needed.   metFORMIN 1000 MG tablet Commonly known as:  GLUCOPHAGE Take 1,000 mg by mouth daily.   metoprolol succinate 100 MG 24 hr tablet Commonly known as:  TOPROL-XL Take 100 mg by mouth daily.    potassium chloride 10 MEQ tablet Commonly known as:  K-DUR,KLOR-CON Take 10 mEq by mouth daily.   simvastatin 20 MG tablet Commonly known as:  ZOCOR Take 20 mg by mouth daily as needed.   tolterodine 4 MG 24 hr capsule Commonly known as:  DETROL LA Take 4 mg by mouth daily.            Durable Medical Equipment  (From admission, onward)         Start     Ordered   03/12/18 1212  For home use only DME Bedside commode  Once    Comments:  bariatric  Question:  Patient needs a bedside commode to treat with the following condition  Answer:  Weakness   03/12/18 1211   03/12/18 1212  For home use only DME Walker rolling  Once    Comments:  bariatric  Question:  Patient needs a walker to treat with the following condition  Answer:  Weakness   03/12/18 1211          Vitals:   03/12/18 0515 03/12/18 1150  BP: (!) 151/69 122/66  Pulse: 85 86  Resp: 20 16  Temp: 98.7 F (37.1 C) (!) 100.5 F (38.1 C)  SpO2: 92% 97%    Skin clean, dry and intact without evidence of skin break down, no evidence of skin tears noted. IV catheter  discontinued intact. Site without signs and symptoms of complications. Dressing and pressure applied. Pt denies pain at this time. No complaints noted.  An After Visit Summary was printed and given to the patient. Patient escorted via [place], and D/C home via private auto.  Dola Argyle

## 2018-03-12 NOTE — Discharge Summary (Signed)
[facility] [place] NAME: Robert [name]    MR#:  [id number]  DATE OF BIRTH:  05/29/1945  DATE OF ADMISSION:  03/09/2018 ADMITTING PHYSICIAN: Nicholes Mango, MD  DATE OF DISCHARGE: 01/11/2019  PRIMARY CARE PHYSICIAN: Cletis Athens, MD    ADMISSION DIAGNOSIS:  Lower urinary tract infectious disease [N39.0] Sepsis, due to unspecified organism, unspecified whether acute organ dysfunction present ([place]) [A41.9]  DISCHARGE DIAGNOSIS:  Active Problems:   Acute cystitis  sepsis SECONDARY DIAGNOSIS:   Past Medical History:  Diagnosis Date  . Allergy   . Cellulitis   . Diabetes mellitus without complication ([place])   . Hyperlipidemia   . Hypertension   . Peripheral vascular disease [facility])     HOSPITAL COURSE:   73 year old male with diabetes and peripheral vascular disease who presented with foul-smelling urine.  1.  Sepsis: Patient presented with leukocytosis and tachycardia.  Sepsis from E. coli UTI.  Sepsis has resolved.  2.  E. coli UTI: As per sensitivities patient can be discharged with oral Keflex.  3.  Diabetes: Patient will continue with ADA diet and outpatient regimen  4.  Acute kidney injury: Creatinine has improved after IV hydration.  5.  Essential hypertension: Continue metoprolol, HCTZ, lisinopril, clonidine and Norvasc  6.  Depression: Continue Cymbalta DISCHARGE CONDITIONS AND DIET:   Stable for discharge [place] diet  CONSULTS OBTAINED:    DRUG ALLERGIES:  No Known Allergies  DISCHARGE MEDICATIONS:   Allergies as of 03/12/2018   No Known Allergies     Medication List    TAKE these medications   amLODipine 10 MG tablet Commonly known as:  NORVASC Take 10 mg by mouth daily.   aspirin EC 81 MG tablet Take 81 mg by mouth daily.   cephALEXin 500 MG capsule Commonly known as:  KEFLEX Take 1 capsule (500 mg total) by mouth 3 (three) times daily for 6 days.   cloNIDine 0.2 MG tablet Commonly known as:   CATAPRES Take 0.2 mg by mouth daily.   clopidogrel 75 MG tablet Commonly known as:  PLAVIX Take 75 mg by mouth daily.   DULoxetine 30 MG capsule Commonly known as:  CYMBALTA Take 30 mg by mouth daily.   furosemide 40 MG tablet Commonly known as:  LASIX Take 40 mg by mouth daily.   hydrochlorothiazide 25 MG tablet Commonly known as:  HYDRODIURIL Take 25 mg by mouth daily.   JANUVIA 100 MG tablet Generic drug:  sitaGLIPtin Take 100 mg by mouth daily.   LANTUS SOLOSTAR 100 UNIT/ML Solostar Pen Generic drug:  Insulin Glargine Inject 55 Units into the skin daily.   Latanoprost-Timolol Maleate 0.005-0.5 % Soln INSTILL ONE DROP TO BOTH EYES AT BEDTIME   lisinopril 5 MG tablet Commonly known as:  PRINIVIL,ZESTRIL Take 5 mg by mouth daily.   loratadine 10 MG tablet Commonly known as:  CLARITIN Take 10 mg by mouth daily as needed for allergies.   meclizine 12.5 MG tablet Commonly known as:  ANTIVERT Take 12.5 mg by mouth 2 (two) times daily as needed.   metFORMIN 1000 MG tablet Commonly known as:  GLUCOPHAGE Take 1,000 mg by mouth daily.   metoprolol succinate 100 MG 24 hr tablet Commonly known as:  TOPROL-XL Take 100 mg by mouth daily.   potassium chloride 10 MEQ tablet Commonly known as:  K-DUR,KLOR-CON Take 10 mEq by mouth daily.   simvastatin 20 MG tablet Commonly known as:  ZOCOR Take 20 mg by mouth daily  as needed.   tolterodine 4 MG 24 hr capsule Commonly known as:  DETROL LA Take 4 mg by mouth daily.            Durable Medical Equipment  (From admission, onward)         Start     Ordered   03/11/18 1408  For home use only DME Bedside commode  Once    Question:  Patient needs a bedside commode to [name] with the following condition  Answer:  Weakness   03/11/18 1407   03/11/18 1407  For home use only DME Walker rolling  Once    Question:  Patient needs a walker to [name] with the following condition  Answer:  Weakness   03/11/18 1407             Today   CHIEF COMPLAINT:  Patient doing well   VITAL SIGNS:  Blood pressure (!) 151/69, pulse 85, temperature 98.7 F (37.1 C), temperature source Oral, resp. rate 20, height 6\' 3"  (1.905 m), weight (!) 163.3 kg, SpO2 92 %.   REVIEW OF SYSTEMS:  Review of Systems  Constitutional: Negative.  Negative for chills, fever and malaise/fatigue.  HENT: Negative.  Negative for ear discharge, ear pain, hearing loss, nosebleeds and sore throat.   Eyes: Negative.  Negative for blurred vision and pain.  Respiratory: Negative.  Negative for cough, hemoptysis, shortness of breath and wheezing.   Cardiovascular: Negative.  Negative for chest pain, palpitations and leg swelling.  Gastrointestinal: Negative.  Negative for abdominal pain, blood in stool, diarrhea, nausea and vomiting.  Genitourinary: Negative.  Negative for dysuria.  Musculoskeletal: Negative.  Negative for back pain.  Skin: Negative.   Neurological: Negative for dizziness, tremors, speech change, focal weakness, seizures and headaches.  Endo/Heme/Allergies: Negative.  Does not bruise/bleed easily.  Psychiatric/Behavioral: Negative.  Negative for depression, hallucinations and suicidal ideas.     PHYSICAL EXAMINATION:  GENERAL:  73 y.o.-year-old patient lying in the bed with no acute distress. obese NECK:  Supple, no jugular venous distention. No thyroid enlargement, no tenderness.  LUNGS: Normal breath sounds bilaterally, no wheezing, rales,rhonchi  No use of accessory muscles of respiration.  CARDIOVASCULAR: S1, S2 normal. No murmurs, rubs, or gallops.  ABDOMEN: Soft, non-tender, non-distended. Bowel sounds present. No organomegaly or mass.  EXTREMITIES: No pedal edema, cyanosis, or clubbing.  PSYCHIATRIC: The patient is alert and oriented x 3.  SKIN: No obvious rash, lesion, or ulcer.   DATA REVIEW:   CBC Recent Labs  Lab 03/12/18 0354  WBC 11.2*  HGB 11.7*  HCT 35.7*  PLT 192    Chemistries  Recent Labs   Lab 03/10/18 0415  03/12/18 0354  NA 136   < > 136  K 3.2*   < > 3.8  CL 100   < > 103  CO2 27   < > 27  GLUCOSE 140*   < > 140*  BUN 22   < > 24*  CREATININE 1.67*   < > 1.56*  CALCIUM 8.0*   < > 8.2*  AST 17  --   --   ALT 17  --   --   ALKPHOS 54  --   --   BILITOT 0.8  --   --    < > = values in this interval not displayed.    Cardiac Enzymes No results for input(s): TROPONINI in the last 168 hours.  Microbiology Results  @MICRORSLT48 @  RADIOLOGY:  US Renal  Result Date: 03/11/2018  CLINICAL DATA:  Recurrent urinary tract infections. EXAM: RENAL / URINARY TRACT ULTRASOUND COMPLETE COMPARISON:  CT of the abdomen and pelvis on 06/04/2008 FINDINGS: Right Kidney: Renal measurements: 13.9 x 5.4 x 5.4 cm = volume: 210 mL . Echogenicity within normal limits. No solid mass or hydronephrosis visualized. Small 1.5 cm cyst of the interpolar cortex. Left Kidney: Renal measurements: 11.4 x 5.4 x 6.1 cm = volume: 196 mL. Echogenicity within normal limits. No mass or hydronephrosis visualized. Bladder: Appears normal for degree of bladder distention. IMPRESSION: Normal sized kidneys without evidence of obstruction or mass. Simple appearing cyst of the right kidney. Electronically Signed   By: Aletta Edouard M.D.   On: 03/11/2018 10:26      Allergies as of 03/12/2018   No Known Allergies     Medication List    TAKE these medications   amLODipine 10 MG tablet Commonly known as:  NORVASC Take 10 mg by mouth daily.   aspirin EC 81 MG tablet Take 81 mg by mouth daily.   cephALEXin 500 MG capsule Commonly known as:  KEFLEX Take 1 capsule (500 mg total) by mouth 3 (three) times daily for 6 days.   cloNIDine 0.2 MG tablet Commonly known as:  CATAPRES Take 0.2 mg by mouth daily.   clopidogrel 75 MG tablet Commonly known as:  PLAVIX Take 75 mg by mouth daily.   DULoxetine 30 MG capsule Commonly known as:  CYMBALTA Take 30 mg by mouth daily.   furosemide 40 MG  tablet Commonly known as:  LASIX Take 40 mg by mouth daily.   hydrochlorothiazide 25 MG tablet Commonly known as:  HYDRODIURIL Take 25 mg by mouth daily.   JANUVIA 100 MG tablet Generic drug:  sitaGLIPtin Take 100 mg by mouth daily.   LANTUS SOLOSTAR 100 UNIT/ML Solostar Pen Generic drug:  Insulin Glargine Inject 55 Units into the skin daily.   Latanoprost-Timolol Maleate 0.005-0.5 % Soln INSTILL ONE DROP TO BOTH EYES AT BEDTIME   lisinopril 5 MG tablet Commonly known as:  PRINIVIL,ZESTRIL Take 5 mg by mouth daily.   loratadine 10 MG tablet Commonly known as:  CLARITIN Take 10 mg by mouth daily as needed for allergies.   meclizine 12.5 MG tablet Commonly known as:  ANTIVERT Take 12.5 mg by mouth 2 (two) times daily as needed.   metFORMIN 1000 MG tablet Commonly known as:  GLUCOPHAGE Take 1,000 mg by mouth daily.   metoprolol succinate 100 MG 24 hr tablet Commonly known as:  TOPROL-XL Take 100 mg by mouth daily.   potassium chloride 10 MEQ tablet Commonly known as:  K-DUR,KLOR-CON Take 10 mEq by mouth daily.   simvastatin 20 MG tablet Commonly known as:  ZOCOR Take 20 mg by mouth daily as needed.   tolterodine 4 MG 24 hr capsule Commonly known as:  DETROL LA Take 4 mg by mouth daily.            Durable Medical Equipment  (From admission, onward)         Start     Ordered   03/11/18 1408  For home use only DME Bedside commode  Once    Question:  Patient needs a bedside commode to [name] with the following condition  Answer:  Weakness   03/11/18 1407   03/11/18 1407  For home use only DME Walker rolling  Once    Question:  Patient needs a walker to [name] with the following condition  Answer:  Weakness   03/11/18 1407  Management plans discussed with the patient and he is in agreement. Stable for discharge home with [facility]  Patient should follow up with pcp  CODE STATUS:     Code Status Orders  (From admission, onward)          Start     Ordered   03/09/18 1818  Full code  Continuous     03/09/18 1817        Code Status History    This patient has a current code status but no historical code status.      TOTAL TIME TAKING CARE OF THIS PATIENT: 38 minutes.    Note: This dictation was prepared with Dragon dictation along with smaller phrase technology. Any transcriptional errors that result from this process are unintentional.  Atreus Hasz M.D on 03/12/2018 at 11:15 AM  Between 7am to 6pm - Pager - [phone] After 6pm go to [email or web address] - password [email or web address] [facility]  Office  [phone]  CC: Primary care physician; Cletis Athens, MD

## 2018-03-12 NOTE — Care Management Note (Signed)
Case Management Note  Patient Details  Name: Robert [name] MRN: [id number] Date of Birth: August 15, 1945   Patient to discharge home today. Corene Cornea with [place] notified of discharge.  RW delivered to room.  Bariatric BSC to be delivered to home  Subjective/Objective:                    Action/Plan:   Expected Discharge Date:  03/12/18               Expected Discharge Plan:  [place]  In-House Referral:     Discharge planning Services  CM Consult  Post Acute Care Choice:  Durable Medical Equipment, Home Health Choice offered to:  Patient  DME Arranged:  3-N-1, Walker rolling DME Agency:  [place]:  PT [facility] Agency:     Status of Service:  In process, will continue to follow  If discussed at Long Length of Stay Meetings, dates discussed:    Additional Comments:  Beverly Sessions, RN 03/12/2018, 1:24 PM

## 2018-03-14 DIAGNOSIS — N179 Acute kidney failure, unspecified: Secondary | ICD-10-CM | POA: Diagnosis not present

## 2018-03-14 DIAGNOSIS — I10 Essential (primary) hypertension: Secondary | ICD-10-CM | POA: Diagnosis not present

## 2018-03-14 DIAGNOSIS — E119 Type 2 diabetes mellitus without complications: Secondary | ICD-10-CM | POA: Diagnosis not present

## 2018-03-14 DIAGNOSIS — Z792 Long term (current) use of antibiotics: Secondary | ICD-10-CM | POA: Diagnosis not present

## 2018-03-14 DIAGNOSIS — Z7901 Long term (current) use of anticoagulants: Secondary | ICD-10-CM | POA: Diagnosis not present

## 2018-03-14 DIAGNOSIS — H8113 Benign paroxysmal vertigo, bilateral: Secondary | ICD-10-CM | POA: Diagnosis not present

## 2018-03-14 DIAGNOSIS — N390 Urinary tract infection, site not specified: Secondary | ICD-10-CM | POA: Diagnosis not present

## 2018-03-14 DIAGNOSIS — Z794 Long term (current) use of insulin: Secondary | ICD-10-CM | POA: Diagnosis not present

## 2018-03-14 LAB — CULTURE, BLOOD (ROUTINE X 2)
CULTURE: NO GROWTH
Culture: NO GROWTH
Special Requests: ADEQUATE

## 2018-03-15 DIAGNOSIS — E119 Type 2 diabetes mellitus without complications: Secondary | ICD-10-CM | POA: Diagnosis not present

## 2018-03-15 DIAGNOSIS — G4733 Obstructive sleep apnea (adult) (pediatric): Secondary | ICD-10-CM | POA: Diagnosis not present

## 2018-03-15 DIAGNOSIS — E118 Type 2 diabetes mellitus with unspecified complications: Secondary | ICD-10-CM | POA: Diagnosis not present

## 2018-03-15 DIAGNOSIS — E669 Obesity, unspecified: Secondary | ICD-10-CM | POA: Diagnosis not present

## 2018-03-15 DIAGNOSIS — E1141 Type 2 diabetes mellitus with diabetic mononeuropathy: Secondary | ICD-10-CM | POA: Diagnosis not present

## 2018-04-16 DIAGNOSIS — H8113 Benign paroxysmal vertigo, bilateral: Secondary | ICD-10-CM | POA: Diagnosis not present

## 2018-04-16 DIAGNOSIS — Z792 Long term (current) use of antibiotics: Secondary | ICD-10-CM | POA: Diagnosis not present

## 2018-04-16 DIAGNOSIS — I10 Essential (primary) hypertension: Secondary | ICD-10-CM | POA: Diagnosis not present

## 2018-04-16 DIAGNOSIS — N179 Acute kidney failure, unspecified: Secondary | ICD-10-CM | POA: Diagnosis not present

## 2018-04-16 DIAGNOSIS — E119 Type 2 diabetes mellitus without complications: Secondary | ICD-10-CM | POA: Diagnosis not present

## 2018-04-16 DIAGNOSIS — N390 Urinary tract infection, site not specified: Secondary | ICD-10-CM | POA: Diagnosis not present

## 2018-04-16 DIAGNOSIS — Z794 Long term (current) use of insulin: Secondary | ICD-10-CM | POA: Diagnosis not present

## 2018-04-16 DIAGNOSIS — Z7901 Long term (current) use of anticoagulants: Secondary | ICD-10-CM | POA: Diagnosis not present

## 2018-04-19 DIAGNOSIS — N182 Chronic kidney disease, stage 2 (mild): Secondary | ICD-10-CM | POA: Diagnosis not present

## 2018-04-19 DIAGNOSIS — E662 Morbid (severe) obesity with alveolar hypoventilation: Secondary | ICD-10-CM | POA: Diagnosis not present

## 2018-04-19 DIAGNOSIS — E1140 Type 2 diabetes mellitus with diabetic neuropathy, unspecified: Secondary | ICD-10-CM | POA: Diagnosis not present

## 2018-04-19 DIAGNOSIS — Z87438 Personal history of other diseases of male genital organs: Secondary | ICD-10-CM | POA: Diagnosis not present

## 2018-07-18 DIAGNOSIS — E1140 Type 2 diabetes mellitus with diabetic neuropathy, unspecified: Secondary | ICD-10-CM | POA: Diagnosis not present

## 2018-07-18 DIAGNOSIS — N182 Chronic kidney disease, stage 2 (mild): Secondary | ICD-10-CM | POA: Diagnosis not present

## 2018-07-18 DIAGNOSIS — E662 Morbid (severe) obesity with alveolar hypoventilation: Secondary | ICD-10-CM | POA: Diagnosis not present

## 2018-07-18 DIAGNOSIS — G4733 Obstructive sleep apnea (adult) (pediatric): Secondary | ICD-10-CM | POA: Diagnosis not present

## 2018-10-10 DIAGNOSIS — H401131 Primary open-angle glaucoma, bilateral, mild stage: Secondary | ICD-10-CM | POA: Diagnosis not present

## 2018-10-18 DIAGNOSIS — Z6841 Body Mass Index (BMI) 40.0 and over, adult: Secondary | ICD-10-CM | POA: Diagnosis not present

## 2018-10-18 DIAGNOSIS — E1122 Type 2 diabetes mellitus with diabetic chronic kidney disease: Secondary | ICD-10-CM | POA: Diagnosis not present

## 2018-10-18 DIAGNOSIS — R0600 Dyspnea, unspecified: Secondary | ICD-10-CM | POA: Diagnosis not present

## 2018-10-18 DIAGNOSIS — G4733 Obstructive sleep apnea (adult) (pediatric): Secondary | ICD-10-CM | POA: Diagnosis not present

## 2018-10-21 ENCOUNTER — Other Ambulatory Visit
Admission: RE | Admit: 2018-10-21 | Discharge: 2018-10-21 | Disposition: A | Discharge status: Home / Self Care | Payer: Medicare Other | Attending: Internal Medicine | Admitting: Internal Medicine

## 2018-10-21 ENCOUNTER — Other Ambulatory Visit: Payer: Self-pay

## 2018-10-21 DIAGNOSIS — E559 Vitamin D deficiency, unspecified: Secondary | ICD-10-CM | POA: Insufficient documentation

## 2018-10-21 DIAGNOSIS — N182 Chronic kidney disease, stage 2 (mild): Secondary | ICD-10-CM | POA: Diagnosis not present

## 2018-10-21 DIAGNOSIS — I129 Hypertensive chronic kidney disease with stage 1 through stage 4 chronic kidney disease, or unspecified chronic kidney disease: Secondary | ICD-10-CM | POA: Insufficient documentation

## 2018-10-21 DIAGNOSIS — G629 Polyneuropathy, unspecified: Secondary | ICD-10-CM | POA: Insufficient documentation

## 2018-10-21 DIAGNOSIS — E538 Deficiency of other specified B group vitamins: Secondary | ICD-10-CM | POA: Insufficient documentation

## 2018-10-21 DIAGNOSIS — E118 Type 2 diabetes mellitus with unspecified complications: Secondary | ICD-10-CM | POA: Insufficient documentation

## 2018-10-21 DIAGNOSIS — E1122 Type 2 diabetes mellitus with diabetic chronic kidney disease: Secondary | ICD-10-CM | POA: Insufficient documentation

## 2018-10-21 DIAGNOSIS — E669 Obesity, unspecified: Secondary | ICD-10-CM | POA: Insufficient documentation

## 2018-10-21 LAB — COMPREHENSIVE METABOLIC PANEL
ALT: 16 U/L (ref 0–44)
AST: 19 U/L (ref 15–41)
Albumin: 3.9 g/dL (ref 3.5–5.0)
Alkaline Phosphatase: 61 U/L (ref 38–126)
Anion gap: 10 (ref 5–15)
BUN: 35 mg/dL — ABNORMAL HIGH (ref 8–23)
CO2: 30 mmol/L (ref 22–32)
Calcium: 9.2 mg/dL (ref 8.9–10.3)
Chloride: 103 mmol/L (ref 98–111)
Creatinine, Ser: 1.7 mg/dL — ABNORMAL HIGH (ref 0.61–1.24)
GFR calc Af Amer: 45 mL/min — ABNORMAL LOW (ref >60)
GFR calc non Af Amer: 39 mL/min — ABNORMAL LOW (ref >60)
Glucose, Bld: 112 mg/dL — ABNORMAL HIGH (ref 70–99)
Potassium: 4 mmol/L (ref 3.5–5.1)
Sodium: 143 mmol/L (ref 135–145)
Total Bilirubin: 0.7 mg/dL (ref 0.3–1.2)
Total Protein: 7.4 g/dL (ref 6.5–8.1)

## 2018-10-21 LAB — CBC
HCT: 41 % (ref 39.0–52.0)
Hemoglobin: 13.5 g/dL (ref 13.0–17.0)
MCH: 31.7 pg (ref 26.0–34.0)
MCHC: 32.9 g/dL (ref 30.0–36.0)
MCV: 96.2 fL (ref 80.0–100.0)
Platelets: 192 10*3/uL (ref 150–400)
RBC: 4.26 MIL/uL (ref 4.22–5.81)
RDW: 13.7 % (ref 11.5–15.5)
WBC: 8.9 10*3/uL (ref 4.0–10.5)
nRBC: 0 % (ref 0.0–0.2)

## 2018-10-21 LAB — HEMOGLOBIN A1C
Hgb A1c MFr Bld: 6.3 % — ABNORMAL HIGH (ref 4.8–5.6)
Mean Plasma Glucose: 134.11 mg/dL

## 2018-10-21 LAB — LIPID PANEL
Cholesterol: 130 mg/dL (ref 0–200)
HDL: 38 mg/dL — ABNORMAL LOW (ref >40)
LDL Cholesterol: 68 mg/dL (ref 0–99)
Total CHOL/HDL Ratio: 3.4 RATIO
Triglycerides: 122 mg/dL (ref <150)
VLDL: 24 mg/dL (ref 0–40)

## 2018-10-21 LAB — VITAMIN B12: Vitamin B-12: 607 pg/mL (ref 180–914)

## 2018-10-21 LAB — TSH: TSH: 2.428 u[IU]/mL (ref 0.350–4.500)

## 2018-10-22 LAB — VITAMIN D 25 HYDROXY (VIT D DEFICIENCY, FRACTURES): Vit D, 25-Hydroxy: 43.5 ng/mL (ref 30.0–100.0)

## 2018-10-22 LAB — T3, FREE: T3, Free: 2.5 pg/mL (ref 2.0–4.4)

## 2018-10-22 LAB — T4: T4, Total: 8.2 ug/dL (ref 4.5–12.0)

## 2018-10-23 DIAGNOSIS — E1122 Type 2 diabetes mellitus with diabetic chronic kidney disease: Secondary | ICD-10-CM | POA: Diagnosis not present

## 2018-10-23 DIAGNOSIS — G4733 Obstructive sleep apnea (adult) (pediatric): Secondary | ICD-10-CM | POA: Diagnosis not present

## 2018-10-23 DIAGNOSIS — Z6841 Body Mass Index (BMI) 40.0 and over, adult: Secondary | ICD-10-CM | POA: Diagnosis not present

## 2018-10-23 DIAGNOSIS — E1165 Type 2 diabetes mellitus with hyperglycemia: Secondary | ICD-10-CM | POA: Diagnosis not present

## 2018-10-25 DIAGNOSIS — E1122 Type 2 diabetes mellitus with diabetic chronic kidney disease: Secondary | ICD-10-CM | POA: Diagnosis not present

## 2018-10-25 DIAGNOSIS — E1140 Type 2 diabetes mellitus with diabetic neuropathy, unspecified: Secondary | ICD-10-CM | POA: Diagnosis not present

## 2018-10-25 DIAGNOSIS — G4733 Obstructive sleep apnea (adult) (pediatric): Secondary | ICD-10-CM | POA: Diagnosis not present

## 2018-10-25 DIAGNOSIS — Z6841 Body Mass Index (BMI) 40.0 and over, adult: Secondary | ICD-10-CM | POA: Diagnosis not present

## 2018-11-06 DIAGNOSIS — H401131 Primary open-angle glaucoma, bilateral, mild stage: Secondary | ICD-10-CM | POA: Diagnosis not present

## 2018-11-26 DIAGNOSIS — E1140 Type 2 diabetes mellitus with diabetic neuropathy, unspecified: Secondary | ICD-10-CM | POA: Diagnosis not present

## 2018-11-26 DIAGNOSIS — Z6841 Body Mass Index (BMI) 40.0 and over, adult: Secondary | ICD-10-CM | POA: Diagnosis not present

## 2018-11-26 DIAGNOSIS — R0600 Dyspnea, unspecified: Secondary | ICD-10-CM | POA: Diagnosis not present

## 2018-11-26 DIAGNOSIS — G4733 Obstructive sleep apnea (adult) (pediatric): Secondary | ICD-10-CM | POA: Diagnosis not present

## 2019-01-27 DIAGNOSIS — Z6841 Body Mass Index (BMI) 40.0 and over, adult: Secondary | ICD-10-CM | POA: Diagnosis not present

## 2019-01-27 DIAGNOSIS — E1140 Type 2 diabetes mellitus with diabetic neuropathy, unspecified: Secondary | ICD-10-CM | POA: Diagnosis not present

## 2019-01-27 DIAGNOSIS — G4733 Obstructive sleep apnea (adult) (pediatric): Secondary | ICD-10-CM | POA: Diagnosis not present

## 2019-01-27 DIAGNOSIS — R0600 Dyspnea, unspecified: Secondary | ICD-10-CM | POA: Diagnosis not present

## 2019-03-31 DIAGNOSIS — R32 Unspecified urinary incontinence: Secondary | ICD-10-CM | POA: Diagnosis not present

## 2019-03-31 DIAGNOSIS — E662 Morbid (severe) obesity with alveolar hypoventilation: Secondary | ICD-10-CM | POA: Diagnosis not present

## 2019-03-31 DIAGNOSIS — G4733 Obstructive sleep apnea (adult) (pediatric): Secondary | ICD-10-CM | POA: Diagnosis not present

## 2019-03-31 DIAGNOSIS — E1140 Type 2 diabetes mellitus with diabetic neuropathy, unspecified: Secondary | ICD-10-CM | POA: Diagnosis not present

## 2019-05-05 DIAGNOSIS — R32 Unspecified urinary incontinence: Secondary | ICD-10-CM | POA: Diagnosis not present

## 2019-05-05 DIAGNOSIS — E1140 Type 2 diabetes mellitus with diabetic neuropathy, unspecified: Secondary | ICD-10-CM | POA: Diagnosis not present

## 2019-05-05 DIAGNOSIS — G4733 Obstructive sleep apnea (adult) (pediatric): Secondary | ICD-10-CM | POA: Diagnosis not present

## 2019-05-05 DIAGNOSIS — E1122 Type 2 diabetes mellitus with diabetic chronic kidney disease: Secondary | ICD-10-CM | POA: Diagnosis not present

## 2019-05-07 DIAGNOSIS — H401131 Primary open-angle glaucoma, bilateral, mild stage: Secondary | ICD-10-CM | POA: Diagnosis not present

## 2019-05-10 ENCOUNTER — Ambulatory Visit: Payer: Medicare Other | Attending: Internal Medicine

## 2019-05-10 DIAGNOSIS — Z23 Encounter for immunization: Secondary | ICD-10-CM

## 2019-05-10 NOTE — Progress Notes (Signed)
   Covid-19 Vaccination Clinic  Name:  Robert [name]    MRN: [id number] DOB: 1945/03/03  05/10/2019  [name] was observed post Covid-19 immunization for 15 minutes without incident. He was provided with Vaccine Information Sheet and instruction to access the V-Safe system.   [name] was instructed to call 911 with any severe reactions post vaccine: Marland Kitchen Difficulty breathing  . Swelling of face and throat  . A fast heartbeat  . A bad rash all over body  . Dizziness and weakness   Immunizations Administered    Name Date Dose VIS Date Route   Pfizer COVID-19 Vaccine 05/10/2019  2:13 PM 0.3 mL 01/31/2019 Intramuscular   Manufacturer: [place]   Lot: [id number]   [place]: [id number]

## 2019-05-19 DIAGNOSIS — R32 Unspecified urinary incontinence: Secondary | ICD-10-CM | POA: Diagnosis not present

## 2019-05-19 DIAGNOSIS — G4733 Obstructive sleep apnea (adult) (pediatric): Secondary | ICD-10-CM | POA: Diagnosis not present

## 2019-05-19 DIAGNOSIS — E662 Morbid (severe) obesity with alveolar hypoventilation: Secondary | ICD-10-CM | POA: Diagnosis not present

## 2019-05-19 DIAGNOSIS — E1140 Type 2 diabetes mellitus with diabetic neuropathy, unspecified: Secondary | ICD-10-CM | POA: Diagnosis not present

## 2019-06-10 ENCOUNTER — Ambulatory Visit: Payer: Medicare Other | Attending: Internal Medicine

## 2019-06-10 DIAGNOSIS — Z23 Encounter for immunization: Secondary | ICD-10-CM

## 2019-06-10 NOTE — Progress Notes (Signed)
   Covid-19 Vaccination Clinic  Name:  Robert [name]    MRN: [id number] DOB: 03/08/1945  06/10/2019  [name] was observed post Covid-19 immunization for 15 minutes without incident. He was provided with Vaccine Information Sheet and instruction to access the V-Safe system.   [name] was instructed to call 911 with any severe reactions post vaccine: Marland Kitchen Difficulty breathing  . Swelling of face and throat  . A fast heartbeat  . A bad rash all over body  . Dizziness and weakness   Immunizations Administered    Name Date Dose VIS Date Route   Pfizer COVID-19 Vaccine 06/10/2019 12:18 PM 0.3 mL 04/16/2018 Intramuscular   Manufacturer: [place]   Lot: [id number]   [place]: [id number]

## 2019-06-23 ENCOUNTER — Other Ambulatory Visit: Payer: Self-pay | Admitting: Internal Medicine

## 2019-06-27 ENCOUNTER — Other Ambulatory Visit: Payer: Self-pay

## 2019-06-27 MED ORDER — CLOPIDOGREL BISULFATE 75 MG PO TABS: 75.0000 mg | ORAL_TABLET | Freq: Every day | ORAL | 2 refills | Status: DC

## 2019-06-27 NOTE — Telephone Encounter (Signed)
Pt's wife called requesting a refill on Mr. Buccieri Clopidogrel 75mg . Pt uses [facility]

## 2019-07-14 ENCOUNTER — Ambulatory Visit: Payer: Medicare Other | Admitting: Internal Medicine

## 2019-07-17 ENCOUNTER — Ambulatory Visit: Payer: Medicare Other | Admitting: Internal Medicine

## 2019-07-22 ENCOUNTER — Other Ambulatory Visit: Payer: Self-pay

## 2019-07-22 ENCOUNTER — Ambulatory Visit: Payer: Medicare Other | Admitting: Internal Medicine

## 2019-07-22 ENCOUNTER — Encounter: Payer: Self-pay | Admitting: Internal Medicine

## 2019-07-22 VITALS — BP 156/81 | HR 82 | Wt 357.1 lb

## 2019-07-22 DIAGNOSIS — E118 Type 2 diabetes mellitus with unspecified complications: Secondary | ICD-10-CM | POA: Diagnosis not present

## 2019-07-22 DIAGNOSIS — I872 Venous insufficiency (chronic) (peripheral): Secondary | ICD-10-CM

## 2019-07-22 DIAGNOSIS — I10 Essential (primary) hypertension: Secondary | ICD-10-CM | POA: Diagnosis not present

## 2019-07-22 DIAGNOSIS — I890 Lymphedema, not elsewhere classified: Secondary | ICD-10-CM

## 2019-07-22 LAB — GLUCOSE, POCT (MANUAL RESULT ENTRY): POC Glucose: 144 mg/dl — AB (ref 70–99)

## 2019-07-22 NOTE — Assessment & Plan Note (Signed)
Stable at the present time patient was advised to continue taking his medication follow DASH diet.  He is also encouraged to lose weight.

## 2019-07-22 NOTE — Progress Notes (Signed)
Established Patient Office Visit  Subjective:  Patient ID: Robert [name], male    DOB: [birth year]  Age: 74 y.o. MRN: [id number]  CC:  Chief Complaint  Patient presents with  . Diabetes    patient checked blood sugar from home and it was 144  . Hypertension    HPI  Robert [name] presents for regular checkup.  He has problem with obesity and is unable to lose weight.  He also has cellulitis of the left leg which is resolved now.  He has not considered gastric bypass surgery to lose weight.  Patient blood sugar and hypertension is under control.  Denies any history of claudication.  Past Medical History:  Diagnosis Date  . Allergy   . Cellulitis   . Diabetes mellitus without complication ([place])   . Hyperlipidemia   . Hypertension   . Peripheral vascular disease [facility])     Past Surgical History:  Procedure Laterality Date  . COLONOSCOPY WITH PROPOFOL N/A 10/02/2017   Procedure: COLONOSCOPY WITH PROPOFOL;  Surgeon: Jonathon Bellows, MD;  Location: [facility] ENDOSCOPY;  Service: Gastroenterology;  Laterality: N/A;  . HERNIA REPAIR      Family History  Problem Relation Age of Onset  . Heart disease Mother   . Stroke Mother   . Heart disease Father   . Heart attack Father     Social History   Socioeconomic History  . Marital status: Married    Spouse name: Not on file  . Number of children: Not on file  . Years of education: Not on file  . Highest education level: Not on file  Occupational History  . Not on file  Tobacco Use  . Smoking status: Never Smoker  . Smokeless tobacco: Never Used  Substance and Sexual Activity  . Alcohol use: No  . Drug use: No  . Sexual activity: Not on file  Other Topics Concern  . Not on file  Social History Narrative  . Not on file   Social Determinants of Health   Financial Resource Strain:   . Difficulty of Paying Living Expenses:   Food Insecurity:   . Worried About Charity fundraiser in the Last Year:   . Arboriculturist  in the Last Year:   Transportation Needs:   . Film/video editor (Medical):   Marland Kitchen Lack of Transportation (Non-Medical):   Physical Activity:   . Days of Exercise per Week:   . Minutes of Exercise per Session:   Stress:   . Feeling of Stress :   Social Connections:   . Frequency of Communication with Friends and Family:   . Frequency of Social Gatherings with Friends and Family:   . Attends Religious Services:   . Active Member of Clubs or Organizations:   . Attends Archivist Meetings:   Marland Kitchen Marital Status:   Intimate Partner Violence:   . Fear of Current or Ex-Partner:   . Emotionally Abused:   Marland Kitchen Physically Abused:   . Sexually Abused:      Current Outpatient Medications:  .  amLODipine (NORVASC) 10 MG tablet, Take 10 mg by mouth daily. , Disp: , Rfl: 4 .  aspirin EC 81 MG tablet, Take 81 mg by mouth daily., Disp: , Rfl:  .  cloNIDine (CATAPRES) 0.2 MG tablet, Take 0.2 mg by mouth daily. , Disp: , Rfl: 5 .  clopidogrel (PLAVIX) 75 MG tablet, Take 1 tablet (75 mg total) by mouth daily., Disp: 90 tablet, Rfl:  2 .  DULoxetine (CYMBALTA) 30 MG capsule, Take 30 mg by mouth daily. , Disp: , Rfl: 3 .  furosemide (LASIX) 40 MG tablet, TAKE 1 TABLET BY MOUTH EVERY DAY, Disp: 90 tablet, Rfl: 3 .  hydrochlorothiazide (HYDRODIURIL) 25 MG tablet, TAKE 1 TABLET BY MOUTH DAILY, Disp: 90 tablet, Rfl: 3 .  LANTUS SOLOSTAR 100 UNIT/ML Solostar Pen, Inject 55 Units into the skin daily. , Disp: , Rfl: 2 .  Latanoprost-Timolol Maleate 0.005-0.5 % SOLN, INSTILL ONE DROP TO BOTH EYES AT BEDTIME, Disp: , Rfl:  .  lisinopril (PRINIVIL,ZESTRIL) 5 MG tablet, Take 5 mg by mouth daily. , Disp: , Rfl: 4 .  loratadine (CLARITIN) 10 MG tablet, Take 10 mg by mouth daily as needed for allergies., Disp: , Rfl:  .  meclizine (ANTIVERT) 12.5 MG tablet, Take 12.5 mg by mouth 2 (two) times daily as needed. , Disp: , Rfl: 0 .  metoprolol succinate (TOPROL-XL) 100 MG 24 hr tablet, Take 100 mg by mouth daily.  , Disp: , Rfl: 1 .  potassium chloride (K-DUR,KLOR-CON) 10 MEQ tablet, Take 10 mEq by mouth daily. , Disp: , Rfl: 3 .  simvastatin (ZOCOR) 20 MG tablet, Take 20 mg by mouth daily as needed. , Disp: , Rfl: 3 .  tolterodine (DETROL LA) 4 MG 24 hr capsule, Take 4 mg by mouth daily., Disp: , Rfl:  .  JANUVIA 100 MG tablet, Take 100 mg by mouth daily. , Disp: , Rfl: 2 .  metFORMIN (GLUCOPHAGE) 1000 MG tablet, Take 1,000 mg by mouth daily. , Disp: , Rfl: 2   No Known Allergies  ROS Review of Systems  Constitutional: Negative for chills, diaphoresis, fever and malaise/fatigue.  HENT: Negative for congestion, ear pain and sore throat.   Respiratory: Negative for cough, shortness of breath, wheezing Cardiovascular: Negative for chest pain, palpitations, peripheral edema, orthopnea, PND Gastrointestinal: Negative for abdominal pain, constipation, diarrhea, nausea and vomiting.  Genitourinary: Negative for dysuria, incontinence, hematuria  Musculoskeletal: Negative for myalgias. Negative for arthralgias. Skin: Negative for rashes or pruritus Neurological: Negative for dizziness, syncope, headaches, focal weakness, altered sensation  Psychiatric/Behavioral: Negative for depression and anxiety.        Objective:    Physical Exam    .  Patient is obese white male in no acute distress he came in with the help of his stick head normocephalic pupils reactive sclera nonicteric tongue is moist papillated neck is supple jugular venous pressure is not elevated there is no carotid bruit no goiter.  On examination of the cardiovascular system apical impulse is not palpable both heart sounds are distant no murmur is audible On examination of the lungs lungs are clear without any rales or rhonchi. Normal is soft nontender liver spleen is not palpable there is no guarding no tenderness. There is no pedal edema.  Distal pulses are weakly palpable.  Neurological examination is nonfocal.   BP (!) 156/81    Pulse 82   Wt (!) 357 lb 1.6 oz (162 kg)   BMI 44.63 kg/m  Wt Readings from Last 3 Encounters:  07/22/19 (!) 357 lb 1.6 oz (162 kg)  03/09/18 (!) 360 lb (163.3 kg)  10/02/17 (!) 350 lb (158.8 kg)     Health Maintenance Due  Topic Date Due  . Hepatitis C Screening  Never done  . FOOT EXAM  Never done  . OPHTHALMOLOGY EXAM  Never done  . PNA vac Low Risk Adult (1 of 2 - PCV13) Never done  .  HEMOGLOBIN A1C  04/20/2019    There are no preventive care reminders to display for this patient.  Lab Results  Component Value Date   TSH 2.428 10/21/2018   Lab Results  Component Value Date   WBC 8.9 10/21/2018   HGB 13.5 10/21/2018   HCT 41.0 10/21/2018   MCV 96.2 10/21/2018   PLT 192 10/21/2018   Lab Results  Component Value Date   NA 143 10/21/2018   K 4.0 10/21/2018   CO2 30 10/21/2018   GLUCOSE 112 (H) 10/21/2018   BUN 35 (H) 10/21/2018   CREATININE 1.70 (H) 10/21/2018   BILITOT 0.7 10/21/2018   ALKPHOS 61 10/21/2018   AST 19 10/21/2018   ALT 16 10/21/2018   PROT 7.4 10/21/2018   ALBUMIN 3.9 10/21/2018   CALCIUM 9.2 10/21/2018   ANIONGAP 10 10/21/2018   Lab Results  Component Value Date   CHOL 130 10/21/2018   Lab Results  Component Value Date   HDL 38 (L) 10/21/2018   Lab Results  Component Value Date   LDLCALC 68 10/21/2018   Lab Results  Component Value Date   TRIG 122 10/21/2018   Lab Results  Component Value Date   CHOLHDL 3.4 10/21/2018   Lab Results  Component Value Date   HGBA1C 6.3 (H) 10/21/2018      Assessment & Plan:   Problem List Items Addressed This Visit      Cardiovascular and Mediastinum   Essential hypertension    Stable at the present time patient was advised to continue taking his medication follow DASH diet.  He is also encouraged to lose weight.      Chronic venous insufficiency    Refer to vascular specialist there is no pedal edema on today's examination.        Endocrine   Type 2 diabetes mellitus with  complication ([place]) - Primary    Blood sugar is stable.      Relevant Orders   POCT glucose (manual entry) (Completed)     Other   Lymphedema    Stable at the present time.         No orders of the defined types were placed in this encounter.  1. Type 2 diabetes mellitus with complication (HCC) Stable. Lose wt - POCT glucose (manual entry)  Controlled  3. Chronic venous insufficiency To vascular doctor  4. Lymphedema Managed by vascular specialist Follow-up: Return in about 8 weeks (around 09/16/2019).    Cletis Athens, MD

## 2019-07-22 NOTE — Assessment & Plan Note (Signed)
Stable at the present time. 

## 2019-07-22 NOTE — Assessment & Plan Note (Signed)
Blood sugar is stable.

## 2019-07-22 NOTE — Assessment & Plan Note (Signed)
Refer to vascular specialist there is no pedal edema on today's examination.

## 2019-08-14 ENCOUNTER — Other Ambulatory Visit: Payer: Self-pay | Admitting: Internal Medicine

## 2019-08-16 IMAGING — US US RENAL
1 series · 14 of 25 positions shown · non-contrast
Comparison: CT of the abdomen and pelvis on 06/04/2008

CLINICAL DATA: Recurrent urinary tract infections.

EXAM:
RENAL / URINARY TRACT ULTRASOUND COMPLETE

[Series 1: us renal · 14 of 40 slices shown]
[im 1/40]
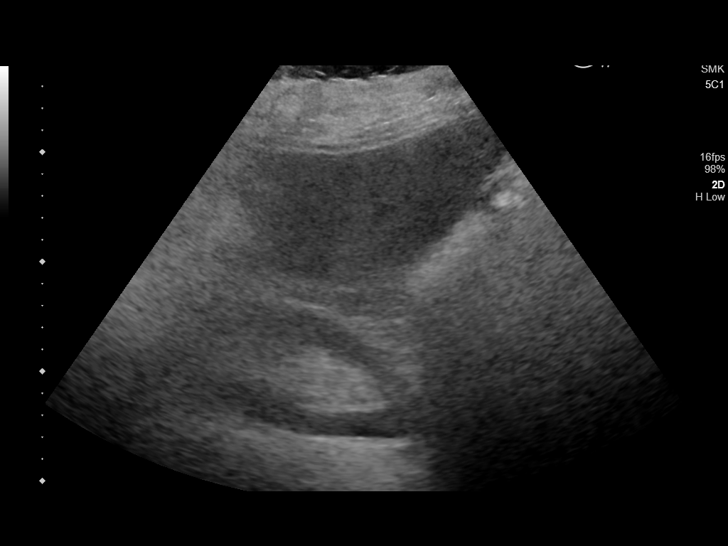
[im 4/40]
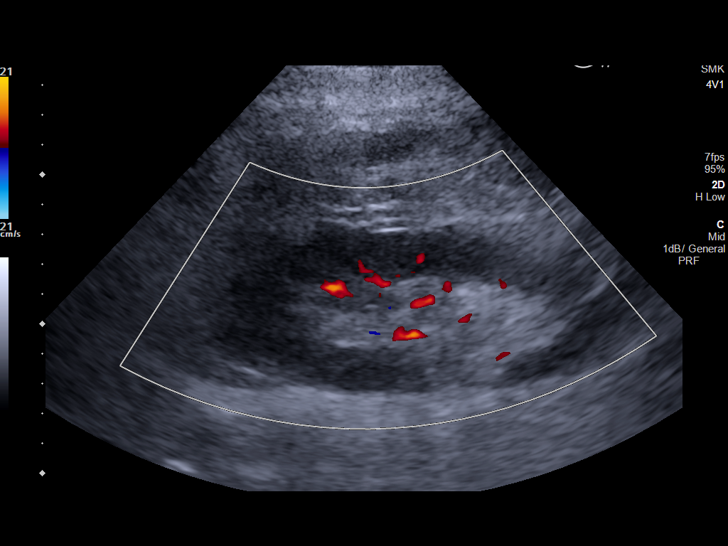
[im 7/40]
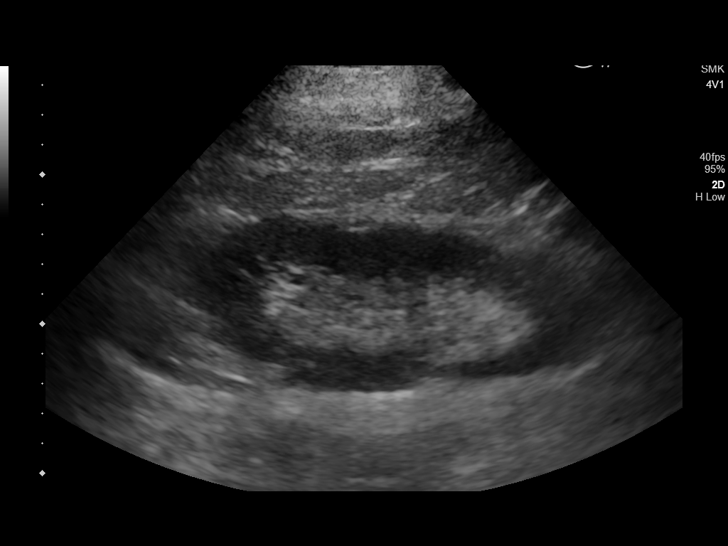
[im 10/40]
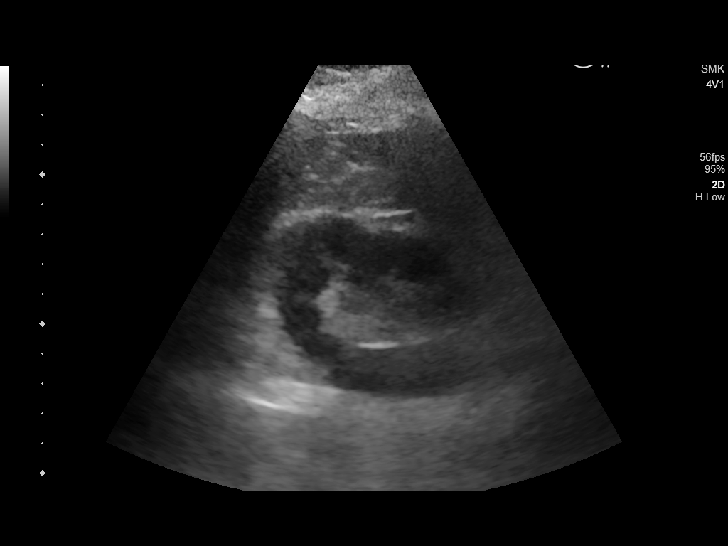
[im 14/40]
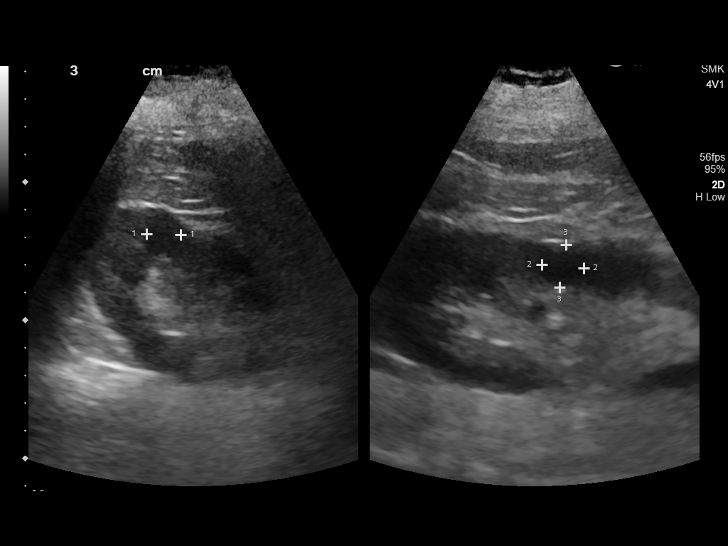
[im 15/40]
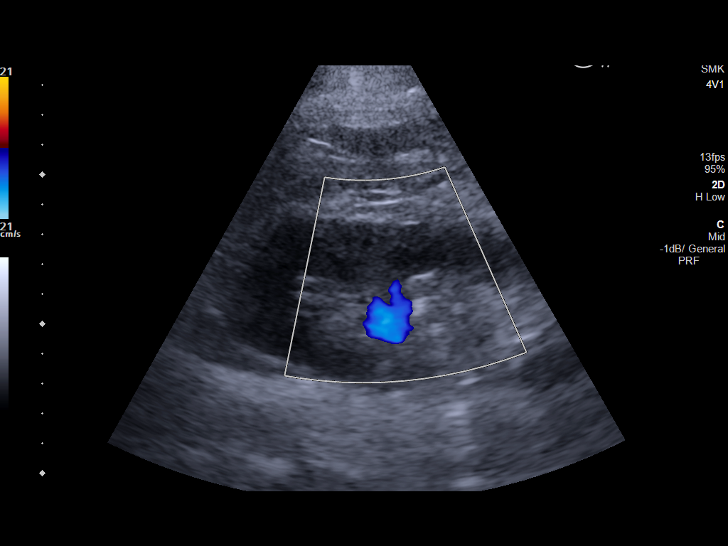
[im 18/40]
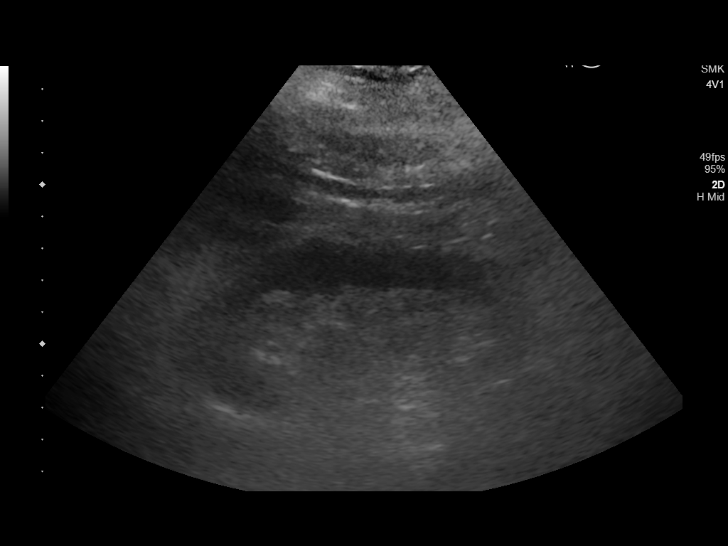
[im 22/40]
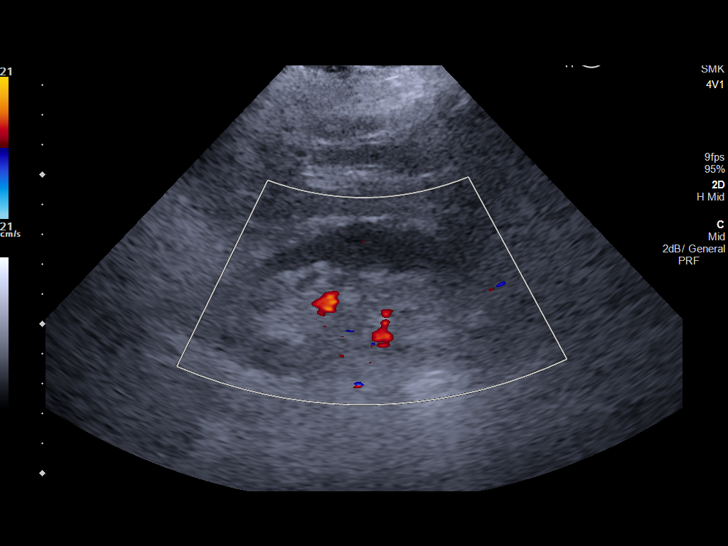
[im 25/40]
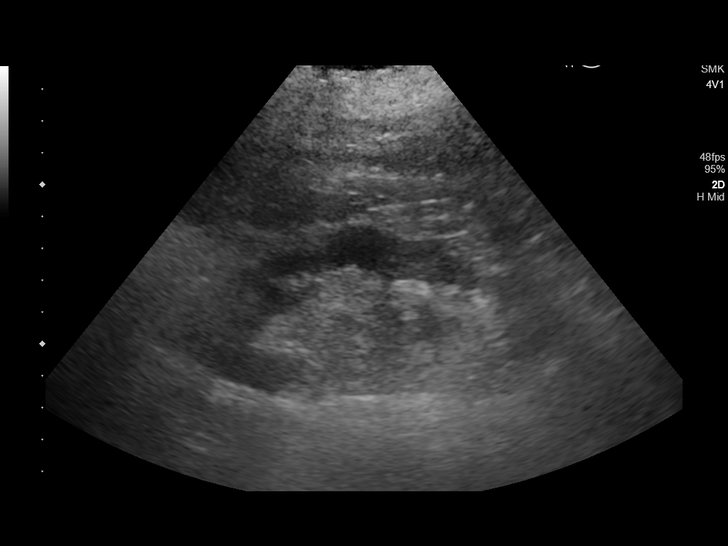
[im 27/40]
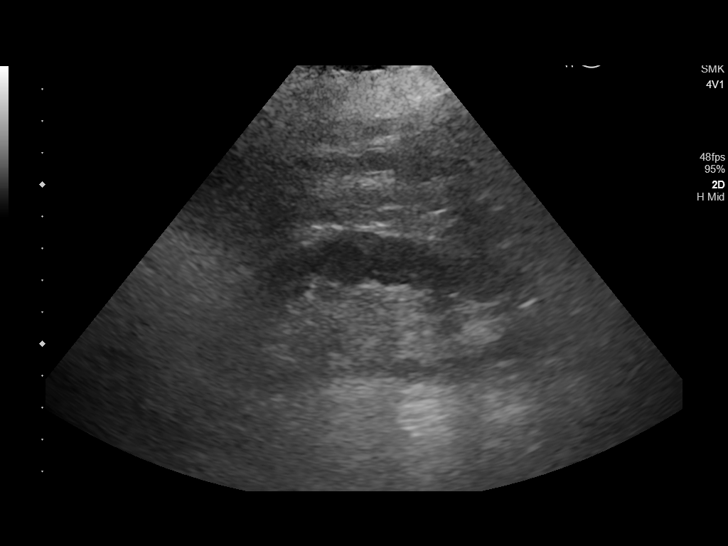
[im 30/40]
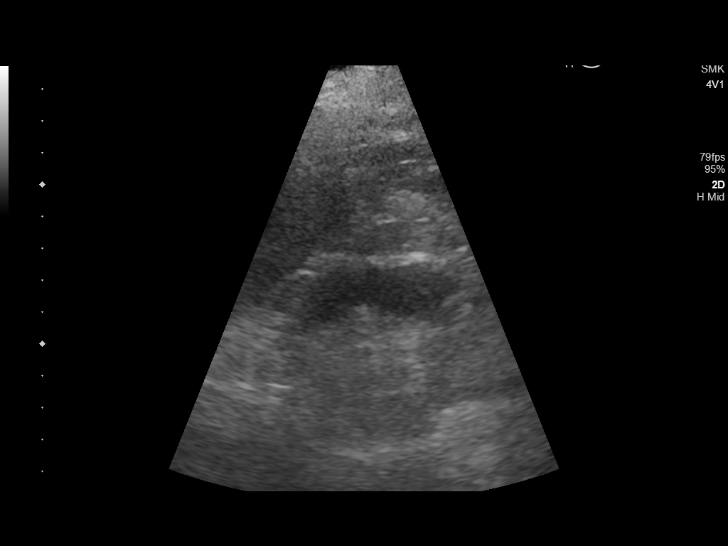
[im 33/40]
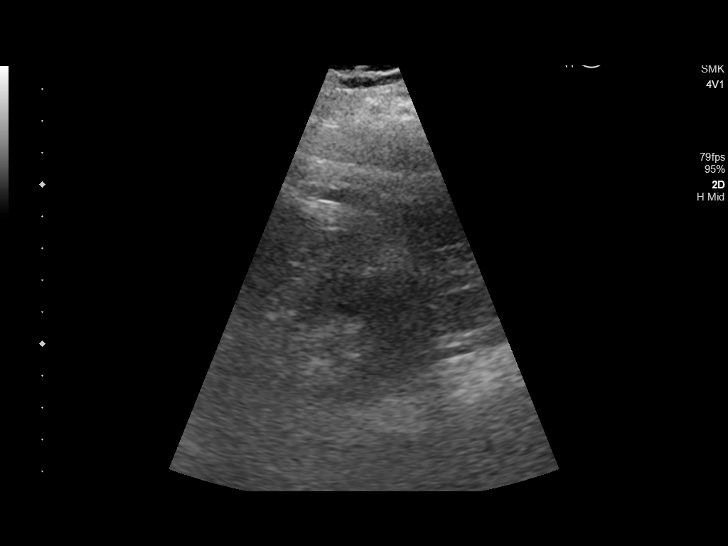
[im 36/40]
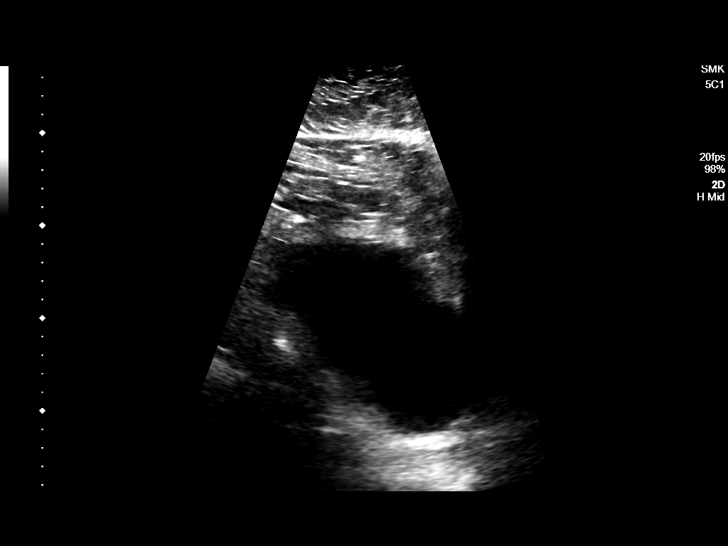
[im 40/40]
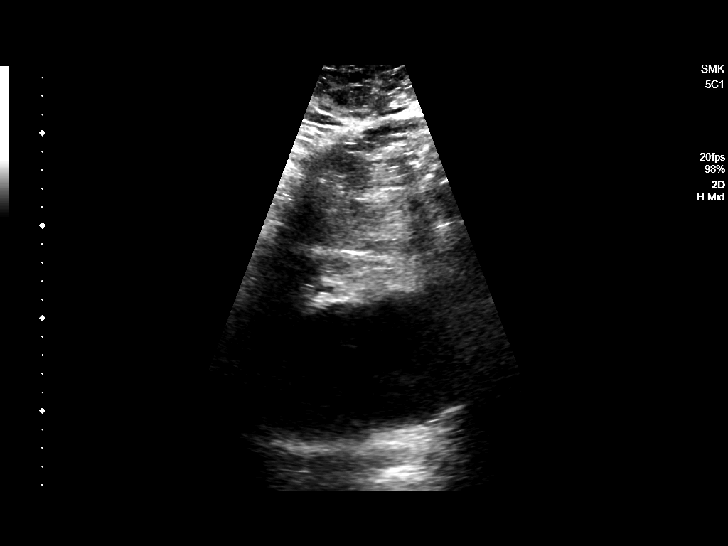

[14 of 25 positions shown; findings below may reference images not displayed]

FINDINGS: Right Kidney:

Renal measurements: 13.9 x 5.4 x 5.4 cm = volume: 210 mL .
Echogenicity within normal limits. No solid mass or hydronephrosis
visualized. Small 1.5 cm cyst of the interpolar cortex.

Left Kidney:

Renal measurements: 11.4 x 5.4 x 6.1 cm = volume: 196 mL.
Echogenicity within normal limits. No mass or hydronephrosis
visualized.

Bladder:

Appears normal for degree of bladder distention.
IMPRESSION: Normal sized kidneys without evidence of obstruction or mass. Simple
appearing cyst of the right kidney.

## 2019-09-15 ENCOUNTER — Other Ambulatory Visit: Payer: Self-pay | Admitting: Internal Medicine

## 2019-09-19 ENCOUNTER — Other Ambulatory Visit: Payer: Self-pay | Admitting: Internal Medicine

## 2019-10-13 ENCOUNTER — Other Ambulatory Visit: Payer: Self-pay | Admitting: Internal Medicine

## 2019-10-22 ENCOUNTER — Other Ambulatory Visit: Payer: Self-pay

## 2019-10-22 ENCOUNTER — Ambulatory Visit: Payer: Medicare Other | Admitting: Internal Medicine

## 2019-10-22 ENCOUNTER — Encounter: Payer: Self-pay | Admitting: Internal Medicine

## 2019-10-22 VITALS — BP 131/63 | HR 58 | Ht 74.0 in | Wt 347.1 lb

## 2019-10-22 DIAGNOSIS — Z6841 Body Mass Index (BMI) 40.0 and over, adult: Secondary | ICD-10-CM

## 2019-10-22 DIAGNOSIS — I890 Lymphedema, not elsewhere classified: Secondary | ICD-10-CM

## 2019-10-22 DIAGNOSIS — E785 Hyperlipidemia, unspecified: Secondary | ICD-10-CM

## 2019-10-22 DIAGNOSIS — Z23 Encounter for immunization: Secondary | ICD-10-CM | POA: Diagnosis not present

## 2019-10-22 DIAGNOSIS — E669 Obesity, unspecified: Secondary | ICD-10-CM | POA: Insufficient documentation

## 2019-10-22 DIAGNOSIS — I10 Essential (primary) hypertension: Secondary | ICD-10-CM

## 2019-10-22 NOTE — Assessment & Plan Note (Signed)
-   Today, the patient's blood pressure is well managed on beta blocker and ace inhibitor. - The patient will continue the current treatment regimen.  - I encouraged the patient to eat a low-sodium diet to help control blood pressure. - I encouraged the patient to live an active lifestyle and complete activities that increases heart rate to 85% target heart rate at least 5 times per week for one hour.

## 2019-10-22 NOTE — Assessment & Plan Note (Signed)
-   I encouraged the patient to lose weight.  - I educated them on making healthy dietary choices including eating more fruits and vegetables and less fried foods. - I encouraged the patient to exercise more, and educated on the benefits of exercise including weight loss, diabetes management, and hypertension management.   

## 2019-10-22 NOTE — Assessment & Plan Note (Signed)
Pt has varicose veins in bilateral legs and 1+ pedal edema. He also has edivdence of dermatitis without any ulceration.

## 2019-10-22 NOTE — Assessment & Plan Note (Signed)
-   The patient's hyperlipidemia is stable on zocor. - The patient will continue the current treatment regimen.  - I encouraged the patient to eat more vegetables and whole wheat, and to avoid fatty foods like whole milk, hard cheese, egg yolks, margarine, baked sweets, and fried foods.  - I encouraged the patient to live an active lifestyle and complete activities for 40 minutes at least three times per week.  - I instructed the patient to go to the ER if they begin having chest pain.

## 2019-10-22 NOTE — Progress Notes (Signed)
Established Patient Office Visit  SUBJECTIVE:  Subjective  Patient ID: Robert [name], male    DOB: [birth year]  Age: 74 y.o. MRN: [id number]  CC:  Chief Complaint  Patient presents with  . Diabetes    patient checked blood sugar from home and it was 170    HPI Robert [name] is a 74 y.o. male presenting today for a diabetes check.  He notes that he checked it at home this morning and it was 170. In office, his POC Glucose was 144 today.   He recently went to the beach and came back with a summer cold. This has since resolved.   He is interested in getting his flu vaccine; we can give him that today. He is fully vaccinated against COVID19.   Past Medical History:  Diagnosis Date  . Allergy   . Cellulitis   . Diabetes mellitus without complication ([place])   . Hyperlipidemia   . Hypertension   . Peripheral vascular disease [facility])     Past Surgical History:  Procedure Laterality Date  . COLONOSCOPY WITH PROPOFOL N/A 10/02/2017   Procedure: COLONOSCOPY WITH PROPOFOL;  Surgeon: Jonathon Bellows, MD;  [facility]: [facility] ENDOSCOPY;  Service: Gastroenterology;  Laterality: N/A;  . HERNIA REPAIR      Family History  Problem Relation Age of Onset  . Heart disease Mother   . Stroke Mother   . Heart disease Father   . Heart attack Father     Social History   Socioeconomic History  . Marital status: Married    Spouse name: Not on file  . Number of children: Not on file  . Years of education: Not on file  . Highest education level: Not on file  Occupational History  . Not on file  Tobacco Use  . Smoking status: Never Smoker  . Smokeless tobacco: Never Used  Vaping Use  . Vaping Use: Never used  Substance and Sexual Activity  . Alcohol use: No  . Drug use: No  . Sexual activity: Not on file  Other Topics Concern  . Not on file  Social History Narrative  . Not on file   Social Determinants of Health   Financial Resource Strain:   . Difficulty of Paying Living  Expenses: Not on file  Food Insecurity:   . Worried About Charity fundraiser in the Last Year: Not on file  . Ran Out of Food in the Last Year: Not on file  Transportation Needs:   . Lack of Transportation (Medical): Not on file  . Lack of Transportation (Non-Medical): Not on file  Physical Activity:   . Days of Exercise per Week: Not on file  . Minutes of Exercise per Session: Not on file  Stress:   . Feeling of Stress : Not on file  Social Connections:   . Frequency of Communication with Friends and Family: Not on file  . Frequency of Social Gatherings with Friends and Family: Not on file  . Attends Religious Services: Not on file  . Active Member of Clubs or Organizations: Not on file  . Attends Archivist Meetings: Not on file  . Marital Status: Not on file  Intimate Partner Violence:   . Fear of Current or Ex-Partner: Not on file  . Emotionally Abused: Not on file  . Physically Abused: Not on file  . Sexually Abused: Not on file     Current Outpatient Medications:  .  amLODipine (NORVASC) 10 MG tablet, Take 10  mg by mouth daily. , Disp: , Rfl: 4 .  aspirin EC 81 MG tablet, Take 81 mg by mouth daily., Disp: , Rfl:  .  cloNIDine (CATAPRES) 0.2 MG tablet, Take 0.2 mg by mouth daily. , Disp: , Rfl: 5 .  clopidogrel (PLAVIX) 75 MG tablet, Take 1 tablet (75 mg total) by mouth daily., Disp: 90 tablet, Rfl: 2 .  DULoxetine (CYMBALTA) 30 MG capsule, TAKE 1 CAPSULE BY MOUTH EVERY 12 HOURS, Disp: 60 capsule, Rfl: 3 .  furosemide (LASIX) 40 MG tablet, TAKE 1 TABLET BY MOUTH EVERY DAY, Disp: 90 tablet, Rfl: 3 .  hydrochlorothiazide (HYDRODIURIL) 25 MG tablet, TAKE 1 TABLET BY MOUTH DAILY, Disp: 90 tablet, Rfl: 3 .  JANUVIA 100 MG tablet, Take 100 mg by mouth daily. , Disp: , Rfl: 2 .  LANTUS SOLOSTAR 100 UNIT/ML Solostar Pen, INJECT 55 UNITS UNDER THE SKIN DAILY, Disp: 9 mL, Rfl: 6 .  Latanoprost-Timolol Maleate 0.005-0.5 % SOLN, INSTILL ONE DROP TO BOTH EYES AT BEDTIME, Disp:  , Rfl:  .  lisinopril (PRINIVIL,ZESTRIL) 5 MG tablet, Take 5 mg by mouth daily. , Disp: , Rfl: 4 .  lisinopril (ZESTRIL) 10 MG tablet, TAKE 1/2 TABLET BY MOUTH DAILY, Disp: 45 tablet, Rfl: 6 .  loratadine (CLARITIN) 10 MG tablet, Take 10 mg by mouth daily as needed for allergies., Disp: , Rfl:  .  meclizine (ANTIVERT) 12.5 MG tablet, Take 12.5 mg by mouth 2 (two) times daily as needed. , Disp: , Rfl: 0 .  metFORMIN (GLUCOPHAGE) 1000 MG tablet, Take 1,000 mg by mouth daily. , Disp: , Rfl: 2 .  metoprolol succinate (TOPROL-XL) 100 MG 24 hr tablet, Take 100 mg by mouth daily. , Disp: , Rfl: 1 .  potassium chloride (K-DUR,KLOR-CON) 10 MEQ tablet, Take 10 mEq by mouth daily. , Disp: , Rfl: 3 .  simvastatin (ZOCOR) 20 MG tablet, Take 20 mg by mouth daily as needed. , Disp: , Rfl: 3 .  tolterodine (DETROL LA) 4 MG 24 hr capsule, TAKE 1 CAPSULE BY MOUTH DAILY, Disp: 90 capsule, Rfl: 3   No Known Allergies  ROS Review of Systems  Constitutional: Negative.   HENT: Negative.   Eyes: Negative.   Respiratory: Negative.  Negative for cough and shortness of breath.   Cardiovascular: Negative.  Negative for chest pain and leg swelling.  Gastrointestinal: Negative.  Negative for abdominal pain.  Endocrine: Negative.   Genitourinary: Negative.   Musculoskeletal: Negative.   Skin: Negative.   Allergic/Immunologic: Negative.   Neurological: Negative.   Hematological: Negative.   Psychiatric/Behavioral: Negative.   All other systems reviewed and are negative.    OBJECTIVE:    Physical Exam Vitals reviewed.  Constitutional:      Appearance: Normal appearance.  HENT:     Mouth/Throat:     Mouth: Mucous membranes are moist.  Eyes:     Pupils: Pupils are equal, round, and reactive to light.  Neck:     Vascular: No carotid bruit.  Cardiovascular:     Rate and Rhythm: Normal rate and regular rhythm.     Pulses: Normal pulses.     Heart sounds: Normal heart sounds.  Pulmonary:     Effort:  Pulmonary effort is normal.     Breath sounds: Normal breath sounds.  Abdominal:     General: Bowel sounds are normal.     Palpations: Abdomen is soft. There is no hepatomegaly, splenomegaly or mass.     Tenderness: There is no abdominal tenderness.  Hernia: No hernia is present.  Musculoskeletal:     Cervical back: Neck supple.     Right lower leg: 1+ Edema present.     Left lower leg: 1+ Edema present.  Skin:    Findings: No rash.     Comments: Varicose veins in bilateral legs  Neurological:     Mental Status: He is alert and oriented to person, place, and time.     Motor: No weakness.  Psychiatric:        Mood and Affect: Mood normal.        Behavior: Behavior normal.     BP 131/63   Pulse (!) 58   Ht 6\' 2"  (1.88 m)   Wt (!) 347 lb 1.6 oz (157.4 kg)   BMI 44.57 kg/m  Wt Readings from Last 3 Encounters:  10/22/19 (!) 347 lb 1.6 oz (157.4 kg)  07/22/19 (!) 357 lb 1.6 oz (162 kg)  03/09/18 (!) 360 lb (163.3 kg)    Health Maintenance Due  Topic Date Due  . Hepatitis C Screening  Never done  . FOOT EXAM  Never done  . OPHTHALMOLOGY EXAM  Never done  . PNA vac Low Risk Adult (1 of 2 - PCV13) Never done  . HEMOGLOBIN A1C  04/20/2019    There are no preventive care reminders to display for this patient.  CBC Latest Ref Rng & Units 10/21/2018 03/12/2018 03/11/2018  WBC 4.0 - 10.5 K/uL 8.9 11.2(H) 8.9  Hemoglobin 13.0 - 17.0 g/dL 13.5 11.7(L) 13.4  Hematocrit 39 - 52 % 41.0 35.7(L) 41.4  Platelets 150 - 400 K/uL 192 192 177   CMP Latest Ref Rng & Units 10/21/2018 03/12/2018 03/11/2018  Glucose 70 - 99 mg/dL 112(H) 140(H) 130(H)  BUN 8 - 23 mg/dL 35(H) 24(H) 25(H)  Creatinine 0.61 - 1.24 mg/dL 1.70(H) 1.56(H) 1.55(H)  Sodium 135 - 145 mmol/L 143 136 138  Potassium 3.5 - 5.1 mmol/L 4.0 3.8 4.1  Chloride 98 - 111 mmol/L 103 103 105  CO2 22 - 32 mmol/L 30 27 24   Calcium 8.9 - 10.3 mg/dL 9.2 8.2(L) 8.1(L)  Total Protein 6.5 - 8.1 g/dL 7.4 - -  Total Bilirubin 0.3 - 1.2  mg/dL 0.7 - -  Alkaline Phos 38 - 126 U/L 61 - -  AST 15 - 41 U/L 19 - -  ALT 0 - 44 U/L 16 - -    Lab Results  Component Value Date   TSH 2.428 10/21/2018   Lab Results  Component Value Date   ALBUMIN 3.9 10/21/2018   ANIONGAP 10 10/21/2018   Lab Results  Component Value Date   CHOL 130 10/21/2018   HDL 38 (L) 10/21/2018   LDLCALC 68 10/21/2018   CHOLHDL 3.4 10/21/2018   Lab Results  Component Value Date   TRIG 122 10/21/2018   Lab Results  Component Value Date   HGBA1C 6.3 (H) 10/21/2018      ASSESSMENT & PLAN:   Problem List Items Addressed This Visit      Cardiovascular and Mediastinum   Essential hypertension - Primary    - Today, the patient's blood pressure is well managed on beta blocker and ace inhibitor. - The patient will continue the current treatment regimen.  - I encouraged the patient to eat a low-sodium diet to help control blood pressure. - I encouraged the patient to live an active lifestyle and complete activities that increases heart rate to 85% target heart rate at least 5 times per week  for one hour.            Other   Hyperlipidemia    - The patient's hyperlipidemia is stable on zocor. - The patient will continue the current treatment regimen.  - I encouraged the patient to eat more vegetables and whole wheat, and to avoid fatty foods like whole milk, hard cheese, egg yolks, margarine, baked sweets, and fried foods.  - I encouraged the patient to live an active lifestyle and complete activities for 40 minutes at least three times per week.  - I instructed the patient to go to the ER if they begin having chest pain.        Lymphedema    Pt has varicose veins in bilateral legs and 1+ pedal edema. He also has edivdence of dermatitis without any ulceration.       Need for influenza vaccination   Relevant Orders   Flu Vaccine QUAD High Dose(Fluad) (Completed)   Class 3 severe obesity due to excess calories with serious comorbidity and  body mass index (BMI) of 40.0 to 44.9 in adult [facility])    - I encouraged the patient to lose weight.  - I educated them on making healthy dietary choices including eating more fruits and vegetables and less fried foods. - I encouraged the patient to exercise more, and educated on the benefits of exercise including weight loss, diabetes management, and hypertension management.           No orders of the defined types were placed in this encounter.   Follow-up: Return in about 2 months (around 12/22/2019) for Physical, labs prior.    Dr. Jane Canary [facility] [address], [place], [place] [address]   By signing my name below, I, [employer], attest that this documentation has been prepared under the direction and in the presence of Cletis Athens, MD. Electronically Signed: Cletis Athens, MD 10/22/19, 12:03 PM   I personally performed the services described in this documentation, which was SCRIBED in my presence. The recorded information has been reviewed and considered accurate. It has been edited as necessary during review. Cletis Athens, MD

## 2019-11-04 DIAGNOSIS — H401131 Primary open-angle glaucoma, bilateral, mild stage: Secondary | ICD-10-CM | POA: Diagnosis not present

## 2019-11-13 DIAGNOSIS — E119 Type 2 diabetes mellitus without complications: Secondary | ICD-10-CM | POA: Diagnosis not present

## 2019-12-16 ENCOUNTER — Other Ambulatory Visit: Payer: Self-pay

## 2019-12-16 ENCOUNTER — Ambulatory Visit (INDEPENDENT_AMBULATORY_CARE_PROVIDER_SITE_OTHER): Payer: Medicare Other | Admitting: Internal Medicine

## 2019-12-16 DIAGNOSIS — Z125 Encounter for screening for malignant neoplasm of prostate: Secondary | ICD-10-CM

## 2019-12-16 DIAGNOSIS — E118 Type 2 diabetes mellitus with unspecified complications: Secondary | ICD-10-CM | POA: Diagnosis not present

## 2019-12-16 DIAGNOSIS — Z6841 Body Mass Index (BMI) 40.0 and over, adult: Secondary | ICD-10-CM

## 2019-12-16 DIAGNOSIS — E785 Hyperlipidemia, unspecified: Secondary | ICD-10-CM | POA: Diagnosis not present

## 2019-12-16 DIAGNOSIS — I10 Essential (primary) hypertension: Secondary | ICD-10-CM | POA: Diagnosis not present

## 2019-12-17 LAB — CBC WITH DIFFERENTIAL/PLATELET
Absolute Monocytes: 818 cells/uL (ref 200–950)
Basophils Absolute: 74 cells/uL (ref 0–200)
Basophils Relative: 0.8 %
Eosinophils Absolute: 298 cells/uL (ref 15–500)
Eosinophils Relative: 3.2 %
HCT: 40.4 % (ref 38.5–50.0)
Hemoglobin: 13.8 g/dL (ref 13.2–17.1)
Lymphs Abs: 3748 cells/uL (ref 850–3900)
MCH: 32.9 pg (ref 27.0–33.0)
MCHC: 34.2 g/dL (ref 32.0–36.0)
MCV: 96.2 fL (ref 80.0–100.0)
MPV: 11 fL (ref 7.5–12.5)
Monocytes Relative: 8.8 %
Neutro Abs: 4362 cells/uL (ref 1500–7800)
Neutrophils Relative %: 46.9 %
Platelets: 200 10*3/uL (ref 140–400)
RBC: 4.2 10*6/uL (ref 4.20–5.80)
RDW: 13.2 % (ref 11.0–15.0)
Total Lymphocyte: 40.3 %
WBC: 9.3 10*3/uL (ref 3.8–10.8)

## 2019-12-17 LAB — COMPLETE METABOLIC PANEL WITH GFR
AG Ratio: 1.6 (calc) (ref 1.0–2.5)
ALT: 15 U/L (ref 9–46)
AST: 18 U/L (ref 10–35)
Albumin: 4 g/dL (ref 3.6–5.1)
Alkaline phosphatase (APISO): 68 U/L (ref 35–144)
BUN/Creatinine Ratio: 20 (calc) (ref 6–22)
BUN: 40 mg/dL — ABNORMAL HIGH (ref 7–25)
CO2: 26 mmol/L (ref 20–32)
Calcium: 9.4 mg/dL (ref 8.6–10.3)
Chloride: 100 mmol/L (ref 98–110)
Creat: 1.98 mg/dL — ABNORMAL HIGH (ref 0.70–1.18)
GFR, Est African American: 37 mL/min/{1.73_m2} — ABNORMAL LOW (ref >=60)
GFR, Est Non African American: 32 mL/min/{1.73_m2} — ABNORMAL LOW (ref >=60)
Globulin: 2.5 g/dL (calc) (ref 1.9–3.7)
Glucose, Bld: 140 mg/dL — ABNORMAL HIGH (ref 65–99)
Potassium: 4 mmol/L (ref 3.5–5.3)
Sodium: 140 mmol/L (ref 135–146)
Total Bilirubin: 0.6 mg/dL (ref 0.2–1.2)
Total Protein: 6.5 g/dL (ref 6.1–8.1)

## 2019-12-17 LAB — LIPID PANEL
Cholesterol: 145 mg/dL (ref <200)
HDL: 38 mg/dL — ABNORMAL LOW (ref >=40)
LDL Cholesterol (Calc): 80 mg/dL (calc)
Non-HDL Cholesterol (Calc): 107 mg/dL (calc) (ref <130)
Total CHOL/HDL Ratio: 3.8 (calc) (ref <5.0)
Triglycerides: 174 mg/dL — ABNORMAL HIGH (ref <150)

## 2019-12-17 LAB — TSH: TSH: 2.46 mIU/L (ref 0.40–4.50)

## 2019-12-17 LAB — PSA: PSA: 0.87 ng/mL (ref <=4.0)

## 2019-12-17 LAB — HEMOGLOBIN A1C
Hgb A1c MFr Bld: 7.1 % of total Hgb — ABNORMAL HIGH (ref <5.7)
Mean Plasma Glucose: 157 (calc)
eAG (mmol/L): 8.7 (calc)

## 2019-12-22 ENCOUNTER — Encounter: Payer: Self-pay | Admitting: Internal Medicine

## 2019-12-22 ENCOUNTER — Ambulatory Visit: Payer: Medicare Other | Admitting: Internal Medicine

## 2019-12-22 ENCOUNTER — Other Ambulatory Visit: Payer: Self-pay

## 2019-12-22 VITALS — BP 130/82 | HR 85 | Ht 74.0 in | Wt 339.1 lb

## 2019-12-22 DIAGNOSIS — Z23 Encounter for immunization: Secondary | ICD-10-CM

## 2019-12-22 DIAGNOSIS — I890 Lymphedema, not elsewhere classified: Secondary | ICD-10-CM

## 2019-12-22 DIAGNOSIS — E118 Type 2 diabetes mellitus with unspecified complications: Secondary | ICD-10-CM

## 2019-12-22 DIAGNOSIS — I10 Essential (primary) hypertension: Secondary | ICD-10-CM

## 2019-12-22 DIAGNOSIS — Z6841 Body Mass Index (BMI) 40.0 and over, adult: Secondary | ICD-10-CM

## 2019-12-22 DIAGNOSIS — Z0000 Encounter for general adult medical examination without abnormal findings: Secondary | ICD-10-CM

## 2019-12-22 DIAGNOSIS — E785 Hyperlipidemia, unspecified: Secondary | ICD-10-CM

## 2019-12-22 DIAGNOSIS — I872 Venous insufficiency (chronic) (peripheral): Secondary | ICD-10-CM

## 2019-12-22 NOTE — Assessment & Plan Note (Signed)
There is 1+ pedal edema of the both legs.  He is doing relatively well on the diuretics and raising the legs.  Advised to use support hose.

## 2019-12-22 NOTE — Assessment & Plan Note (Signed)
Patient diabetes is under control on Lantus 55 units subcutaneous daily and Januvia 100 mg p.o. daily. Metformin 1000 mg by mouth daily.

## 2019-12-22 NOTE — Assessment & Plan Note (Signed)
Patient main problem at the present time is exogenous obesity and he was advised to work on that.  He has been educated about the diet before.  He was also advised to walk on a daily basis especially with the help of a stick because his gait is unstable.

## 2019-12-22 NOTE — Assessment & Plan Note (Signed)
Hypertension is under control on amlodipine clonidine he also takes hydrochlorothiazide.  Patient was advised to lose weight.  His gait is kind of unsteady so I advised him to walk with the help of a stick.

## 2019-12-22 NOTE — Assessment & Plan Note (Signed)
Patient was given influenza vaccine today

## 2019-12-22 NOTE — Progress Notes (Signed)
Established Patient Office Visit  Subjective:  Patient ID: Robert [name], male    DOB: November 24, 1945  Age: 74 y.o. MRN: [id number]  CC:  Chief Complaint  Patient presents with  . Annual Exam    HPI  Robert [name]  Physical/ Patient is known to have diabetes dyslipidemia hypertension and peripheral vascular disease.  Patient has a history of sepsis in the past has a elevated creatinine today to checkup on the blood test.  Her blood pressure is normal today.  Weight is 339 pounds   Past Medical History:  Diagnosis Date  . Allergy   . Cellulitis   . Diabetes mellitus without complication ([place])   . Hyperlipidemia   . Hypertension   . Peripheral vascular disease [facility])     Past Surgical History:  Procedure Laterality Date  . COLONOSCOPY WITH PROPOFOL N/A 10/02/2017   Procedure: COLONOSCOPY WITH PROPOFOL;  Surgeon: Jonathon Bellows, MD;  Location[facility] ENDOSCOPY;  Service: Gastroenterology;  Laterality: N/A;  . HERNIA REPAIR      Family History  Problem Relation Age of Onset  . Heart disease Mother   . Stroke Mother   . Heart disease Father   . Heart attack Father     Social History   Socioeconomic History  . Marital status: Married    Spouse name: Not on file  . Number of children: Not on file  . Years of education: Not on file  . Highest education level: Not on file  Occupational History  . Not on file  Tobacco Use  . Smoking status: Never Smoker  . Smokeless tobacco: Never Used  Vaping Use  . Vaping Use: Never used  Substance and Sexual Activity  . Alcohol use: No  . Drug use: No  . Sexual activity: Not on file  Other Topics Concern  . Not on file  Social History Narrative  . Not on file   Social Determinants of Health   Financial Resource Strain:   . Difficulty of Paying Living Expenses: Not on file  Food Insecurity:   . Worried About Charity fundraiser in the Last Year: Not on file  . Ran Out of Food in the Last Year: Not on file    Transportation Needs:   . Lack of Transportation (Medical): Not on file  . Lack of Transportation (Non-Medical): Not on file  Physical Activity:   . Days of Exercise per Week: Not on file  . Minutes of Exercise per Session: Not on file  Stress:   . Feeling of Stress : Not on file  Social Connections:   . Frequency of Communication with Friends and Family: Not on file  . Frequency of Social Gatherings with Friends and Family: Not on file  . Attends Religious Services: Not on file  . Active Member of Clubs or Organizations: Not on file  . Attends Archivist Meetings: Not on file  . Marital Status: Not on file  Intimate Partner Violence:   . Fear of Current or Ex-Partner: Not on file  . Emotionally Abused: Not on file  . Physically Abused: Not on file  . Sexually Abused: Not on file     Current Outpatient Medications:  .  amLODipine (NORVASC) 10 MG tablet, Take 10 mg by mouth daily. , Disp: , Rfl: 4 .  aspirin EC 81 MG tablet, Take 81 mg by mouth daily., Disp: , Rfl:  .  cloNIDine (CATAPRES) 0.2 MG tablet, Take 0.2 mg by mouth daily. ,  Disp: , Rfl: 5 .  clopidogrel (PLAVIX) 75 MG tablet, Take 1 tablet (75 mg total) by mouth daily., Disp: 90 tablet, Rfl: 2 .  DULoxetine (CYMBALTA) 30 MG capsule, TAKE 1 CAPSULE BY MOUTH EVERY 12 HOURS, Disp: 60 capsule, Rfl: 3 .  furosemide (LASIX) 40 MG tablet, TAKE 1 TABLET BY MOUTH EVERY DAY, Disp: 90 tablet, Rfl: 3 .  hydrochlorothiazide (HYDRODIURIL) 25 MG tablet, TAKE 1 TABLET BY MOUTH DAILY, Disp: 90 tablet, Rfl: 3 .  JANUVIA 100 MG tablet, Take 100 mg by mouth daily. , Disp: , Rfl: 2 .  LANTUS SOLOSTAR 100 UNIT/ML Solostar Pen, INJECT 55 UNITS UNDER THE SKIN DAILY, Disp: 9 mL, Rfl: 6 .  Latanoprost-Timolol Maleate 0.005-0.5 % SOLN, INSTILL ONE DROP TO BOTH EYES AT BEDTIME, Disp: , Rfl:  .  lisinopril (PRINIVIL,ZESTRIL) 5 MG tablet, Take 5 mg by mouth daily. , Disp: , Rfl: 4 .  lisinopril (ZESTRIL) 10 MG tablet, TAKE 1/2 TABLET BY  MOUTH DAILY, Disp: 45 tablet, Rfl: 6 .  loratadine (CLARITIN) 10 MG tablet, Take 10 mg by mouth daily as needed for allergies., Disp: , Rfl:  .  meclizine (ANTIVERT) 12.5 MG tablet, Take 12.5 mg by mouth 2 (two) times daily as needed. , Disp: , Rfl: 0 .  metFORMIN (GLUCOPHAGE) 1000 MG tablet, Take 1,000 mg by mouth daily. , Disp: , Rfl: 2 .  metoprolol succinate (TOPROL-XL) 100 MG 24 hr tablet, Take 100 mg by mouth daily. , Disp: , Rfl: 1 .  potassium chloride (K-DUR,KLOR-CON) 10 MEQ tablet, Take 10 mEq by mouth daily. , Disp: , Rfl: 3 .  simvastatin (ZOCOR) 20 MG tablet, Take 20 mg by mouth daily as needed. , Disp: , Rfl: 3 .  tolterodine (DETROL LA) 4 MG 24 hr capsule, TAKE 1 CAPSULE BY MOUTH DAILY, Disp: 90 capsule, Rfl: 3   No Known Allergies  ROS Review of Systems  Constitutional: Negative.  Negative for chills and fatigue.  HENT: Positive for sneezing. Negative for ear discharge, hearing loss, nosebleeds and sinus pain.   Eyes: Negative.  Negative for pain, redness and itching.  Respiratory: Positive for shortness of breath. Negative for cough, choking and chest tightness.   Cardiovascular: Negative.  Negative for chest pain.  Gastrointestinal: Negative.  Negative for abdominal pain.  Endocrine: Negative.   Genitourinary: Negative.  Negative for difficulty urinating and hematuria.  Musculoskeletal: Positive for back pain and gait problem. Negative for myalgias.  Skin: Negative.   Allergic/Immunologic: Negative.   Neurological: Negative for seizures and headaches.  Hematological: Negative.   Psychiatric/Behavioral: Negative.  Negative for behavioral problems and hallucinations.  All other systems reviewed and are negative.     Objective:    Physical Exam Vitals reviewed.  Constitutional:      Appearance: Normal appearance. He is obese.  HENT:     Mouth/Throat:     Mouth: Mucous membranes are moist.  Eyes:     Pupils: Pupils are equal, round, and reactive to light.    Neck:     Vascular: No carotid bruit.  Cardiovascular:     Rate and Rhythm: Normal rate and regular rhythm.     Pulses: Normal pulses.     Heart sounds: Normal heart sounds. No murmur heard.   Pulmonary:     Effort: Pulmonary effort is normal.     Breath sounds: Normal breath sounds. No wheezing.  Abdominal:     General: Bowel sounds are normal.     Palpations: Abdomen is soft.  There is no hepatomegaly, splenomegaly or mass.     Tenderness: There is no abdominal tenderness.     Hernia: No hernia is present.  Musculoskeletal:     Cervical back: Neck supple. No rigidity.     Right lower leg: No edema.     Left lower leg: No edema.  Skin:    Coloration: Skin is not pale.     Findings: No rash.  Neurological:     Mental Status: He is alert and oriented to person, place, and time.     Motor: No weakness.  Psychiatric:        Mood and Affect: Mood normal.        Behavior: Behavior normal.     BP 130/82   Pulse 85   Ht 6\' 2"  (1.88 m)   Wt (!) 339 lb 1.6 oz (153.8 kg)   BMI 43.54 kg/m  Wt Readings from Last 3 Encounters:  12/22/19 (!) 339 lb 1.6 oz (153.8 kg)  10/22/19 (!) 347 lb 1.6 oz (157.4 kg)  07/22/19 (!) 357 lb 1.6 oz (162 kg)     Health Maintenance Due  Topic Date Due  . Hepatitis C Screening  Never done  . FOOT EXAM  Never done  . PNA vac Low Risk Adult (1 of 2 - PCV13) Never done    There are no preventive care reminders to display for this patient.  Lab Results  Component Value Date   TSH 2.46 12/16/2019   Lab Results  Component Value Date   WBC 9.3 12/16/2019   HGB 13.8 12/16/2019   HCT 40.4 12/16/2019   MCV 96.2 12/16/2019   PLT 200 12/16/2019   Lab Results  Component Value Date   NA 140 12/16/2019   K 4.0 12/16/2019   CO2 26 12/16/2019   GLUCOSE 140 (H) 12/16/2019   BUN 40 (H) 12/16/2019   CREATININE 1.98 (H) 12/16/2019   BILITOT 0.6 12/16/2019   ALKPHOS 61 10/21/2018   AST 18 12/16/2019   ALT 15 12/16/2019   PROT 6.5 12/16/2019    ALBUMIN 3.9 10/21/2018   CALCIUM 9.4 12/16/2019   ANIONGAP 10 10/21/2018   Lab Results  Component Value Date   CHOL 145 12/16/2019   Lab Results  Component Value Date   HDL 38 (L) 12/16/2019   Lab Results  Component Value Date   LDLCALC 80 12/16/2019   Lab Results  Component Value Date   TRIG 174 (H) 12/16/2019   Lab Results  Component Value Date   CHOLHDL 3.8 12/16/2019   Lab Results  Component Value Date   HGBA1C 7.1 (H) 12/16/2019      Assessment & Plan:   Problem List Items Addressed This Visit      Cardiovascular and Mediastinum   Essential hypertension - Primary    Hypertension is under control on amlodipine clonidine he also takes hydrochlorothiazide.  Patient was advised to lose weight.  His gait is kind of unsteady so I advised him to walk with the help of a stick.      Chronic venous insufficiency    There is 1+ pedal edema of the both legs.  He is doing relatively well on the diuretics and raising the legs.  Advised to use support hose.        Endocrine   Type 2 diabetes mellitus with complication [facility])    Patient diabetes is under control on Lantus 55 units subcutaneous daily and Januvia 100 mg p.o. daily. Metformin 1000 mg by  mouth daily.        Other   Hyperlipidemia    Patient is on statin for lipid control.      Lymphedema    Patient was advised to use support hose      Need for influenza vaccination    Patient was given influenza vaccine today      Class 3 severe obesity due to excess calories with serious comorbidity and body mass index (BMI) of 40.0 to 44.9 in adult [facility])    Patient main problem at the present time is exogenous obesity and he was advised to work on that.  He has been educated about the diet before.  He was also advised to walk on a daily basis especially with the help of a stick because his gait is unstable.      Annual physical exam    Physical examination did not reveal any rales or rhonchi.  HEENT examination  is normal neck is supple there is no goiter.  Chest is clear abdomen is obese soft nontender.  Rectal examination was declined by the patient.         - I encouraged the patient to lose weight.  - I educated him on making healthy dietary choices including eating more fruits and vegetables and less fried foods. - I encouraged the patient to exercise more, and educated on the benefits of exercise including weight loss, diabetes prevention, and hypertension prevention.  Follow-up:3 months   Cletis Athens, MD

## 2019-12-22 NOTE — Assessment & Plan Note (Signed)
Physical examination did not reveal any rales or rhonchi.  HEENT examination is normal neck is supple there is no goiter.  Chest is clear abdomen is obese soft nontender.  Rectal examination was declined by the patient.

## 2019-12-22 NOTE — Assessment & Plan Note (Signed)
Patient was advised to use support hose

## 2019-12-22 NOTE — Assessment & Plan Note (Signed)
Patient is on statin for lipid control.

## 2020-01-10 ENCOUNTER — Other Ambulatory Visit: Payer: Self-pay | Admitting: Internal Medicine

## 2020-02-08 ENCOUNTER — Other Ambulatory Visit: Payer: Self-pay | Admitting: Internal Medicine

## 2020-02-17 ENCOUNTER — Other Ambulatory Visit: Payer: Self-pay | Admitting: Internal Medicine

## 2020-03-16 ENCOUNTER — Other Ambulatory Visit: Payer: Self-pay

## 2020-03-23 ENCOUNTER — Other Ambulatory Visit: Payer: Self-pay

## 2020-03-23 ENCOUNTER — Ambulatory Visit (INDEPENDENT_AMBULATORY_CARE_PROVIDER_SITE_OTHER): Payer: Medicare Other | Admitting: Internal Medicine

## 2020-03-23 ENCOUNTER — Encounter: Payer: Self-pay | Admitting: Internal Medicine

## 2020-03-23 VITALS — BP 145/86 | HR 74 | Ht 74.0 in | Wt 335.3 lb

## 2020-03-23 DIAGNOSIS — I872 Venous insufficiency (chronic) (peripheral): Secondary | ICD-10-CM | POA: Diagnosis not present

## 2020-03-23 DIAGNOSIS — E785 Hyperlipidemia, unspecified: Secondary | ICD-10-CM | POA: Diagnosis not present

## 2020-03-23 DIAGNOSIS — E118 Type 2 diabetes mellitus with unspecified complications: Secondary | ICD-10-CM

## 2020-03-23 DIAGNOSIS — I890 Lymphedema, not elsewhere classified: Secondary | ICD-10-CM

## 2020-03-23 DIAGNOSIS — Z6841 Body Mass Index (BMI) 40.0 and over, adult: Secondary | ICD-10-CM

## 2020-03-23 DIAGNOSIS — I10 Essential (primary) hypertension: Secondary | ICD-10-CM

## 2020-03-23 LAB — GLUCOSE, POCT (MANUAL RESULT ENTRY): POC Glucose: 188 mg/dl — AB (ref 70–99)

## 2020-03-23 NOTE — Assessment & Plan Note (Signed)
-   I encouraged the patient to lose weight.  - I educated them on making healthy dietary choices including eating more fruits and vegetables and less fried foods. - I encouraged the patient to exercise more, and educated on the benefits of exercise  

## 2020-03-23 NOTE — Assessment & Plan Note (Signed)
.  -   I encouraged the patient to eat a low-sodium diet to help control blood pressure. - I encouraged the patient to live an active lifestyle and complete activities that increases heart rate to 85% target heart rate at least 5 times per week for one hour.     

## 2020-03-23 NOTE — Assessment & Plan Note (Signed)
I asked the patient to get a complete lipid profile.

## 2020-03-23 NOTE — Assessment & Plan Note (Signed)
There is no pedal edema no calf tenderness Bevelyn Buckles' sign is negative.

## 2020-03-23 NOTE — Progress Notes (Signed)
Established Patient Office Visit  Subjective:  Patient ID: Robert [name], male    DOB: September 16, 1945  Age: 76 y.o. MRN: [id number]  CC:  Chief Complaint  Patient presents with  . Follow-up    Patient is here for a 3 month blood pressure and diabetes follow up    HPI  Robert [name] presents for evaluation of the diabetes hypertension he denies any neck pain blood sugar is not under control so I asked him to increase his Lantus insulin to 45-day.  He cannot take Metformin because of the kidney problem.  He also stopped Januvia.  I told him to increase his insulin 2 units every day to his blood sugar 130.  Past Medical History:  Diagnosis Date  . Allergy   . Cellulitis   . Diabetes mellitus without complication ([place])   . Hyperlipidemia   . Hypertension   . Peripheral vascular disease [facility])     Past Surgical History:  Procedure Laterality Date  . COLONOSCOPY WITH PROPOFOL N/A 10/02/2017   Procedure: COLONOSCOPY WITH PROPOFOL;  Surgeon: Jonathon Bellows, MD;  Location: [facility] ENDOSCOPY;  Service: Gastroenterology;  Laterality: N/A;  . HERNIA REPAIR      Family History  Problem Relation Age of Onset  . Heart disease Mother   . Stroke Mother   . Heart disease Father   . Heart attack Father     Social History   Socioeconomic History  . Marital status: Married    Spouse name: Not on file  . Number of children: Not on file  . Years of education: Not on file  . Highest education level: Not on file  Occupational History  . Not on file  Tobacco Use  . Smoking status: Never Smoker  . Smokeless tobacco: Never Used  Vaping Use  . Vaping Use: Never used  Substance and Sexual Activity  . Alcohol use: No  . Drug use: No  . Sexual activity: Not on file  Other Topics Concern  . Not on file  Social History Narrative  . Not on file   Social Determinants of Health   Financial Resource Strain: Not on file  Food Insecurity: Not on file  Transportation Needs: Not on file   Physical Activity: Not on file  Stress: Not on file  Social Connections: Not on file  Intimate Partner Violence: Not on file     Current Outpatient Medications:  .  amLODipine (NORVASC) 10 MG tablet, Take 10 mg by mouth daily. , Disp: , Rfl: 4 .  aspirin EC 81 MG tablet, Take 81 mg by mouth daily., Disp: , Rfl:  .  BD PEN NEEDLE NANO 2ND GEN 32G X 4 MM MISC, USE 1 PEN NEEDLE EVERY DAY AS DIRECTED, Disp: 100 each, Rfl: 4 .  cloNIDine (CATAPRES) 0.2 MG tablet, Take 0.2 mg by mouth daily. , Disp: , Rfl: 5 .  cloNIDine (CATAPRES) 0.3 MG tablet, TAKE 1 TABLET BY MOUTH DAILY, Disp: 90 tablet, Rfl: 3 .  clopidogrel (PLAVIX) 75 MG tablet, Take 1 tablet (75 mg total) by mouth daily., Disp: 90 tablet, Rfl: 2 .  DULoxetine (CYMBALTA) 30 MG capsule, TAKE 1 CAPSULE BY MOUTH EVERY 12 HOURS, Disp: 60 capsule, Rfl: 3 .  furosemide (LASIX) 40 MG tablet, TAKE 1 TABLET BY MOUTH EVERY DAY, Disp: 90 tablet, Rfl: 3 .  hydrochlorothiazide (HYDRODIURIL) 25 MG tablet, TAKE 1 TABLET BY MOUTH DAILY, Disp: 90 tablet, Rfl: 3 .  JANUVIA 100 MG tablet, Take 100 mg by  mouth daily. , Disp: , Rfl: 2 .  LANTUS SOLOSTAR 100 UNIT/ML Solostar Pen, INJECT 55 UNITS UNDER THE SKIN DAILY, Disp: 9 mL, Rfl: 6 .  Latanoprost-Timolol Maleate 0.005-0.5 % SOLN, INSTILL ONE DROP TO BOTH EYES AT BEDTIME, Disp: , Rfl:  .  lisinopril (PRINIVIL,ZESTRIL) 5 MG tablet, Take 5 mg by mouth daily. , Disp: , Rfl: 4 .  lisinopril (ZESTRIL) 10 MG tablet, TAKE 1/2 TABLET BY MOUTH DAILY, Disp: 45 tablet, Rfl: 6 .  loratadine (CLARITIN) 10 MG tablet, Take 10 mg by mouth daily as needed for allergies., Disp: , Rfl:  .  meclizine (ANTIVERT) 12.5 MG tablet, Take 12.5 mg by mouth 2 (two) times daily as needed. , Disp: , Rfl: 0 .  metFORMIN (GLUCOPHAGE) 1000 MG tablet, Take 1,000 mg by mouth daily. , Disp: , Rfl: 2 .  metoprolol succinate (TOPROL-XL) 100 MG 24 hr tablet, Take 100 mg by mouth daily. , Disp: , Rfl: 1 .  potassium chloride (K-DUR,KLOR-CON) 10  MEQ tablet, Take 10 mEq by mouth daily. , Disp: , Rfl: 3 .  simvastatin (ZOCOR) 20 MG tablet, TAKE 1 TABLET BY MOUTH DAILY, Disp: 90 tablet, Rfl: 1 .  tolterodine (DETROL LA) 4 MG 24 hr capsule, TAKE 1 CAPSULE BY MOUTH DAILY, Disp: 90 capsule, Rfl: 3   No Known Allergies  ROS Review of Systems  Constitutional: Negative.   HENT: Negative.   Eyes: Negative.   Respiratory: Negative.   Cardiovascular: Negative.   Gastrointestinal: Negative.   Endocrine: Negative.   Genitourinary: Negative.   Musculoskeletal: Negative.   Skin: Negative.   Allergic/Immunologic: Negative.   Neurological: Negative.   Hematological: Negative.   Psychiatric/Behavioral: Negative.   All other systems reviewed and are negative.     Objective:    Physical Exam Vitals reviewed.  Constitutional:      Appearance: Normal appearance.  HENT:     Mouth/Throat:     Mouth: Mucous membranes are moist.  Eyes:     Pupils: Pupils are equal, round, and reactive to light.  Neck:     Vascular: No carotid bruit.  Cardiovascular:     Rate and Rhythm: Normal rate and regular rhythm.     Pulses: Normal pulses.     Heart sounds: Normal heart sounds.  Pulmonary:     Effort: Pulmonary effort is normal.     Breath sounds: Normal breath sounds.  Abdominal:     General: Bowel sounds are normal.     Palpations: Abdomen is soft. There is no hepatomegaly, splenomegaly or mass.     Tenderness: There is no abdominal tenderness.     Hernia: No hernia is present.  Musculoskeletal:     Cervical back: Neck supple.     Right lower leg: No edema.     Left lower leg: No edema.  Skin:    Findings: No rash.  Neurological:     Mental Status: He is alert and oriented to person, place, and time.     Motor: No weakness.  Psychiatric:        Mood and Affect: Mood normal.        Behavior: Behavior normal.     BP (!) 145/86   Pulse 74   Ht 6\' 2"  (1.88 m)   Wt (!) 335 lb 4.8 oz (152.1 kg)   BMI 43.05 kg/m  Wt Readings from  Last 3 Encounters:  03/23/20 (!) 335 lb 4.8 oz (152.1 kg)  12/22/19 (!) 339 lb 1.6 oz (153.8 kg)  10/22/19 (!) 347 lb 1.6 oz (157.4 kg)     Health Maintenance Due  Topic Date Due  . Hepatitis C Screening  Never done  . FOOT EXAM  Never done  . PNA vac Low Risk Adult (1 of 2 - PCV13) Never done  . COVID-19 Vaccine (3 - Booster for Pfizer series) 12/10/2019    There are no preventive care reminders to display for this patient.  Lab Results  Component Value Date   TSH 2.46 12/16/2019   Lab Results  Component Value Date   WBC 9.3 12/16/2019   HGB 13.8 12/16/2019   HCT 40.4 12/16/2019   MCV 96.2 12/16/2019   PLT 200 12/16/2019   Lab Results  Component Value Date   NA 140 12/16/2019   K 4.0 12/16/2019   CO2 26 12/16/2019   GLUCOSE 140 (H) 12/16/2019   BUN 40 (H) 12/16/2019   CREATININE 1.98 (H) 12/16/2019   BILITOT 0.6 12/16/2019   ALKPHOS 61 10/21/2018   AST 18 12/16/2019   ALT 15 12/16/2019   PROT 6.5 12/16/2019   ALBUMIN 3.9 10/21/2018   CALCIUM 9.4 12/16/2019   ANIONGAP 10 10/21/2018   Lab Results  Component Value Date   CHOL 145 12/16/2019   Lab Results  Component Value Date   HDL 38 (L) 12/16/2019   Lab Results  Component Value Date   LDLCALC 80 12/16/2019   Lab Results  Component Value Date   TRIG 174 (H) 12/16/2019   Lab Results  Component Value Date   CHOLHDL 3.8 12/16/2019   Lab Results  Component Value Date   HGBA1C 7.1 (H) 12/16/2019      Assessment & Plan:   Problem List Items Addressed This Visit      Cardiovascular and Mediastinum   Essential hypertension    -   .  - I encouraged the patient to eat a low-sodium diet to help control blood pressure. - I encouraged the patient to live an active lifestyle and complete activities that increases heart rate to 85% target heart rate at least 5 times per week for one hour.          Chronic venous insufficiency    There is no pedal edema no calf tenderness Bevelyn Buckles' sign is  negative.        Endocrine   Type 2 diabetes mellitus with complication ([place]) - Primary      - I encouraged the patient to regularly check blood sugar.  - I encouraged the patient to monitor diet. I encouraged the patient to eat low-carb and low-sugar to help prevent blood sugar spikes.  - I encouraged the patient to continue following their prescribed treatment plan for diabetes - I informed the patient to get help if blood sugar drops below 54mg /dL, or if suddenly have trouble thinking clearly or breathing.          Relevant Orders   POCT glucose (manual entry) (Completed)     Other   Hyperlipidemia    I asked the patient to get a complete lipid profile.      Lymphedema    Patient does not have any swelling of the legs.  He has evidence of chronic dermatitis of the both legs.      Class 3 severe obesity due to excess calories with serious comorbidity and body mass index (BMI) of 40.0 to 44.9 in adult [facility])    - I encouraged the patient to lose weight.  - I educated them on making  healthy dietary choices including eating more fruits and vegetables and less fried foods. - I encouraged the patient to exercise more, and educated on the benefits of exercise  .           No orders of the defined types were placed in this encounter.   Follow-up: No follow-ups on file.    Cletis Athens, MD

## 2020-03-23 NOTE — Assessment & Plan Note (Signed)
-   I encouraged the patient to regularly check blood sugar.  - I encouraged the patient to monitor diet. I encouraged the patient to eat low-carb and low-sugar to help prevent blood sugar spikes.  - I encouraged the patient to continue following their prescribed treatment plan for diabetes - I informed the patient to get help if blood sugar drops below 54mg/dL, or if suddenly have trouble thinking clearly or breathing.     

## 2020-03-23 NOTE — Assessment & Plan Note (Signed)
Patient does not have any swelling of the legs.  He has evidence of chronic dermatitis of the both legs.

## 2020-04-21 ENCOUNTER — Other Ambulatory Visit: Payer: Self-pay | Admitting: Internal Medicine

## 2020-04-27 ENCOUNTER — Other Ambulatory Visit: Payer: Self-pay | Admitting: Internal Medicine

## 2020-05-10 ENCOUNTER — Other Ambulatory Visit: Payer: Self-pay | Admitting: Internal Medicine

## 2020-05-13 DIAGNOSIS — H401131 Primary open-angle glaucoma, bilateral, mild stage: Secondary | ICD-10-CM | POA: Diagnosis not present

## 2020-06-05 ENCOUNTER — Other Ambulatory Visit: Payer: Self-pay | Admitting: Internal Medicine

## 2020-06-11 ENCOUNTER — Other Ambulatory Visit: Payer: Self-pay

## 2020-06-11 MED ORDER — MECLIZINE HCL 12.5 MG PO TABS: 12.5000 mg | ORAL_TABLET | Freq: Two times a day (BID) | ORAL | 0 refills | Status: DC | PRN

## 2020-06-16 ENCOUNTER — Other Ambulatory Visit: Payer: Self-pay

## 2020-06-16 ENCOUNTER — Ambulatory Visit (INDEPENDENT_AMBULATORY_CARE_PROVIDER_SITE_OTHER): Payer: Medicare Other | Admitting: Internal Medicine

## 2020-06-16 DIAGNOSIS — E118 Type 2 diabetes mellitus with unspecified complications: Secondary | ICD-10-CM

## 2020-06-16 DIAGNOSIS — I10 Essential (primary) hypertension: Secondary | ICD-10-CM | POA: Diagnosis not present

## 2020-06-16 DIAGNOSIS — Z6841 Body Mass Index (BMI) 40.0 and over, adult: Secondary | ICD-10-CM

## 2020-06-16 DIAGNOSIS — E66813 Obesity, class 3: Secondary | ICD-10-CM

## 2020-06-16 DIAGNOSIS — E785 Hyperlipidemia, unspecified: Secondary | ICD-10-CM | POA: Diagnosis not present

## 2020-06-17 LAB — COMPLETE METABOLIC PANEL WITH GFR
AG Ratio: 1.8 (calc) (ref 1.0–2.5)
ALT: 16 U/L (ref 9–46)
AST: 21 U/L (ref 10–35)
Albumin: 4.2 g/dL (ref 3.6–5.1)
Alkaline phosphatase (APISO): 67 U/L (ref 35–144)
BUN/Creatinine Ratio: 18 (calc) (ref 6–22)
BUN: 29 mg/dL — ABNORMAL HIGH (ref 7–25)
CO2: 26 mmol/L (ref 20–32)
Calcium: 9.8 mg/dL (ref 8.6–10.3)
Chloride: 105 mmol/L (ref 98–110)
Creat: 1.65 mg/dL — ABNORMAL HIGH (ref 0.70–1.18)
GFR, Est African American: 46 mL/min/{1.73_m2} — ABNORMAL LOW (ref >=60)
GFR, Est Non African American: 40 mL/min/{1.73_m2} — ABNORMAL LOW (ref >=60)
Globulin: 2.4 g/dL (calc) (ref 1.9–3.7)
Glucose, Bld: 83 mg/dL (ref 65–99)
Potassium: 4 mmol/L (ref 3.5–5.3)
Sodium: 145 mmol/L (ref 135–146)
Total Bilirubin: 0.4 mg/dL (ref 0.2–1.2)
Total Protein: 6.6 g/dL (ref 6.1–8.1)

## 2020-06-17 LAB — LIPID PANEL
Cholesterol: 148 mg/dL (ref <200)
HDL: 38 mg/dL — ABNORMAL LOW (ref >=40)
LDL Cholesterol (Calc): 84 mg/dL (calc)
Non-HDL Cholesterol (Calc): 110 mg/dL (calc) (ref <130)
Total CHOL/HDL Ratio: 3.9 (calc) (ref <5.0)
Triglycerides: 164 mg/dL — ABNORMAL HIGH (ref <150)

## 2020-06-17 LAB — CBC WITH DIFFERENTIAL/PLATELET
Absolute Monocytes: 648 cells/uL (ref 200–950)
Basophils Absolute: 48 cells/uL (ref 0–200)
Basophils Relative: 0.8 %
Eosinophils Absolute: 180 cells/uL (ref 15–500)
Eosinophils Relative: 3 %
HCT: 40.2 % (ref 38.5–50.0)
Hemoglobin: 13.3 g/dL (ref 13.2–17.1)
Lymphs Abs: 2694 cells/uL (ref 850–3900)
MCH: 31.6 pg (ref 27.0–33.0)
MCHC: 33.1 g/dL (ref 32.0–36.0)
MCV: 95.5 fL (ref 80.0–100.0)
MPV: 10.6 fL (ref 7.5–12.5)
Monocytes Relative: 10.8 %
Neutro Abs: 2430 cells/uL (ref 1500–7800)
Neutrophils Relative %: 40.5 %
Platelets: 171 10*3/uL (ref 140–400)
RBC: 4.21 10*6/uL (ref 4.20–5.80)
RDW: 12.8 % (ref 11.0–15.0)
Total Lymphocyte: 44.9 %
WBC: 6 10*3/uL (ref 3.8–10.8)

## 2020-06-17 LAB — HEMOGLOBIN A1C
Hgb A1c MFr Bld: 6.2 % of total Hgb — ABNORMAL HIGH (ref <5.7)
Mean Plasma Glucose: 131 mg/dL
eAG (mmol/L): 7.3 mmol/L

## 2020-06-17 LAB — PSA: PSA: 0.88 ng/mL (ref <=4.00)

## 2020-06-17 LAB — TSH: TSH: 2.4 mIU/L (ref 0.40–4.50)

## 2020-06-21 ENCOUNTER — Ambulatory Visit: Payer: Medicare Other | Admitting: Internal Medicine

## 2020-06-22 ENCOUNTER — Ambulatory Visit (INDEPENDENT_AMBULATORY_CARE_PROVIDER_SITE_OTHER): Payer: Medicare Other | Admitting: Internal Medicine

## 2020-06-22 ENCOUNTER — Other Ambulatory Visit: Payer: Self-pay

## 2020-06-22 ENCOUNTER — Encounter: Payer: Self-pay | Admitting: Internal Medicine

## 2020-06-22 VITALS — BP 129/78 | HR 67 | Ht 74.0 in | Wt 331.6 lb

## 2020-06-22 DIAGNOSIS — Z6841 Body Mass Index (BMI) 40.0 and over, adult: Secondary | ICD-10-CM

## 2020-06-22 DIAGNOSIS — E118 Type 2 diabetes mellitus with unspecified complications: Secondary | ICD-10-CM

## 2020-06-22 DIAGNOSIS — E66813 Obesity, class 3: Secondary | ICD-10-CM

## 2020-06-22 DIAGNOSIS — I10 Essential (primary) hypertension: Secondary | ICD-10-CM | POA: Diagnosis not present

## 2020-06-22 DIAGNOSIS — E785 Hyperlipidemia, unspecified: Secondary | ICD-10-CM | POA: Diagnosis not present

## 2020-06-22 NOTE — Assessment & Plan Note (Signed)
Hypercholesterolemia  I advised the patient to follow Mediterranean diet This diet is rich in fruits vegetables and whole grain, and This diet is also rich in fish and lean meat Patient should also eat a handful of almonds or walnuts daily Recent heart study indicated that average follow-up on this kind of diet reduces the cardiovascular mortality by 50 to 70%== 

## 2020-06-22 NOTE — Assessment & Plan Note (Signed)

## 2020-06-22 NOTE — Assessment & Plan Note (Signed)

## 2020-06-22 NOTE — Progress Notes (Signed)
Established Patient Office Visit  Subjective:  Patient ID: Robert [name], male    DOB: December 14, 1945  Age: 75 y.o. MRN: [id number]  CC:  Chief Complaint  Patient presents with  . Diabetes  . follow up lab res    Diabetes    Robert [name] presents for general checkup.  Denies any chest pain or shortness of breath- I encouraged the patient to lose weight.  Patient lab tests were discussed with him his hemoglobin AIC is coming down.  Blood sugar is under control.   Past Medical History:  Diagnosis Date  . Allergy   . Cellulitis   . Diabetes mellitus without complication ([place])   . Hyperlipidemia   . Hypertension   . Peripheral vascular disease [facility])     Past Surgical History:  Procedure Laterality Date  . COLONOSCOPY WITH PROPOFOL N/A 10/02/2017   Procedure: COLONOSCOPY WITH PROPOFOL;  Surgeon: Jonathon Bellows, MD;  [facility] ENDOSCOPY;  Service: Gastroenterology;  Laterality: N/A;  . HERNIA REPAIR      Family History  Problem Relation Age of Onset  . Heart disease Mother   . Stroke Mother   . Heart disease Father   . Heart attack Father     Social History   Socioeconomic History  . Marital status: Married    Spouse name: Not on file  . Number of children: Not on file  . Years of education: Not on file  . Highest education level: Not on file  Occupational History  . Not on file  Tobacco Use  . Smoking status: Never Smoker  . Smokeless tobacco: Never Used  Vaping Use  . Vaping Use: Never used  Substance and Sexual Activity  . Alcohol use: No  . Drug use: No  . Sexual activity: Not on file  Other Topics Concern  . Not on file  Social History Narrative  . Not on file   Social Determinants of Health   Financial Resource Strain: Not on file  Food Insecurity: Not on file  Transportation Needs: Not on file  Physical Activity: Not on file  Stress: Not on file  Social Connections: Not on file  Intimate Partner Violence: Not on file      Current Outpatient Medications:  .  amLODipine (NORVASC) 10 MG tablet, TAKE 1 TABLET BY MOUTH DAILY, Disp: 90 tablet, Rfl: 3 .  aspirin EC 81 MG tablet, Take 81 mg by mouth daily., Disp: , Rfl:  .  BD PEN NEEDLE NANO 2ND GEN 32G X 4 MM MISC, USE 1 PEN NEEDLE EVERY DAY AS DIRECTED, Disp: 100 each, Rfl: 4 .  cloNIDine (CATAPRES) 0.2 MG tablet, Take 0.2 mg by mouth daily. , Disp: , Rfl: 5 .  cloNIDine (CATAPRES) 0.3 MG tablet, TAKE 1 TABLET BY MOUTH DAILY, Disp: 90 tablet, Rfl: 3 .  clopidogrel (PLAVIX) 75 MG tablet, TAKE 1 TABLET(75 MG) BY MOUTH DAILY, Disp: 90 tablet, Rfl: 2 .  DULoxetine (CYMBALTA) 30 MG capsule, TAKE 1 CAPSULE BY MOUTH EVERY 12 HOURS, Disp: 60 capsule, Rfl: 3 .  furosemide (LASIX) 40 MG tablet, TAKE 1 TABLET BY MOUTH EVERY DAY, Disp: 90 tablet, Rfl: 3 .  hydrochlorothiazide (HYDRODIURIL) 25 MG tablet, TAKE 1 TABLET BY MOUTH DAILY, Disp: 90 tablet, Rfl: 3 .  JANUVIA 100 MG tablet, Take 100 mg by mouth daily. , Disp: , Rfl: 2 .  LANTUS SOLOSTAR 100 UNIT/ML Solostar Pen, INJECT 55 UNITS UNDER THE SKIN DAILY, Disp: 9 mL, Rfl: 6 .  Latanoprost-Timolol  Maleate 0.005-0.5 % SOLN, INSTILL ONE DROP TO BOTH EYES AT BEDTIME, Disp: , Rfl:  .  lisinopril (PRINIVIL,ZESTRIL) 5 MG tablet, Take 5 mg by mouth daily. , Disp: , Rfl: 4 .  lisinopril (ZESTRIL) 10 MG tablet, TAKE 1/2 TABLET BY MOUTH DAILY, Disp: 45 tablet, Rfl: 6 .  loratadine (CLARITIN) 10 MG tablet, Take 10 mg by mouth daily as needed for allergies., Disp: , Rfl:  .  meclizine (ANTIVERT) 12.5 MG tablet, Take 1 tablet (12.5 mg total) by mouth 2 (two) times daily as needed., Disp: 30 tablet, Rfl: 0 .  metFORMIN (GLUCOPHAGE) 1000 MG tablet, Take 1,000 mg by mouth daily. , Disp: , Rfl: 2 .  metoprolol succinate (TOPROL-XL) 100 MG 24 hr tablet, TAKE 1 TABLET BY MOUTH EVERY 12 HOURS, Disp: 180 tablet, Rfl: 3 .  potassium chloride (K-DUR,KLOR-CON) 10 MEQ tablet, Take 10 mEq by mouth daily. , Disp: , Rfl: 3 .  simvastatin (ZOCOR) 20  MG tablet, TAKE 1 TABLET BY MOUTH DAILY, Disp: 90 tablet, Rfl: 1 .  tolterodine (DETROL LA) 4 MG 24 hr capsule, TAKE 1 CAPSULE BY MOUTH DAILY, Disp: 90 capsule, Rfl: 3   No Known Allergies  ROS Review of Systems  Constitutional: Negative.   HENT: Negative.   Eyes: Negative.   Respiratory: Negative.   Cardiovascular: Negative.   Gastrointestinal: Negative.   Endocrine: Negative.   Genitourinary: Negative.   Musculoskeletal: Negative.   Skin: Negative.   Allergic/Immunologic: Negative.   Neurological: Negative.   Hematological: Negative.   Psychiatric/Behavioral: Negative.   All other systems reviewed and are negative.     Objective:    Physical Exam Vitals reviewed.  Constitutional:      Appearance: Normal appearance.  HENT:     Mouth/Throat:     Mouth: Mucous membranes are moist.  Eyes:     Pupils: Pupils are equal, round, and reactive to light.  Neck:     Vascular: No carotid bruit.  Cardiovascular:     Rate and Rhythm: Normal rate and regular rhythm.     Pulses: Normal pulses.     Heart sounds: Normal heart sounds.  Pulmonary:     Effort: Pulmonary effort is normal.     Breath sounds: Normal breath sounds.  Abdominal:     General: Bowel sounds are normal.     Palpations: Abdomen is soft. There is no hepatomegaly, splenomegaly or mass.     Tenderness: There is no abdominal tenderness.     Hernia: No hernia is present.  Musculoskeletal:     Cervical back: Neck supple.     Right lower leg: No edema.     Left lower leg: No edema.  Skin:    Findings: No rash.  Neurological:     Mental Status: He is alert and oriented to person, place, and time.     Motor: No weakness.  Psychiatric:        Mood and Affect: Mood normal.        Behavior: Behavior normal.     BP 129/78   Pulse 67   Ht 6\' 2"  (1.88 m)   Wt (!) 331 lb 9.6 oz (150.4 kg)   BMI 42.57 kg/m  Wt Readings from Last 3 Encounters:  06/22/20 (!) 331 lb 9.6 oz (150.4 kg)  03/23/20 (!) 335 lb 4.8 oz  (152.1 kg)  12/22/19 (!) 339 lb 1.6 oz (153.8 kg)     Health Maintenance Due  Topic Date Due  . Hepatitis C Screening  Never done  . FOOT EXAM  Never done  . PNA vac Low Risk Adult (1 of 2 - PCV13) Never done  . COVID-19 Vaccine (3 - Booster for Pfizer series) 12/10/2019    There are no preventive care reminders to display for this patient.  Lab Results  Component Value Date   TSH 2.40 06/16/2020   Lab Results  Component Value Date   WBC 6.0 06/16/2020   HGB 13.3 06/16/2020   HCT 40.2 06/16/2020   MCV 95.5 06/16/2020   PLT 171 06/16/2020    Lab Results  Component Value Date   CHOL 148 06/16/2020   Lab Results  Component Value Date   HDL 38 (L) 06/16/2020   Lab Results  Component Value Date   LDLCALC 84 06/16/2020   Lab Results  Component Value Date   TRIG 164 (H) 06/16/2020   Lab Results  Component Value Date   CHOLHDL 3.9 06/16/2020   Lab Results  Component Value Date   HGBA1C 6.2 (H) 06/16/2020      Assessment & Plan:   Problem List Items Addressed This Visit      Cardiovascular and Mediastinum   Essential hypertension    Patient blood pressure is normal patient denies any chest pain or shortness of breath there is no history of palpitation or paroxysmal nocturnal dyspnea   patient was advised to follow low-salt low-cholesterol diet    ideally I want to keep systolic blood pressure below 130 mmHg, patient was asked to check blood pressure one times a week and give me a report on that.  Patient will be follow-up in 3 months  or earlier as needed, patient will call me back for any change in the cardiovascular symptoms           Endocrine   Type 2 diabetes mellitus with complication ([place])    - The patient's blood sugar is under control on med. - The patient will continue the current treatment regimen.  - I encouraged the patient to regularly check blood sugar.  - I encouraged the patient to monitor diet. I encouraged the patient to eat low-carb  and low-sugar to help prevent blood sugar spikes.  - I encouraged the patient to continue following their prescribed treatment plan for diabetes - I informed the patient to get help if blood sugar drops below 54mg /dL, or if suddenly have trouble thinking clearly or breathing.         Other   Hyperlipidemia - Primary    Hypercholesterolemia  I advised the patient to follow Mediterranean diet This diet is rich in fruits vegetables and whole grain, and This diet is also rich in fish and lean meat Patient should also eat a handful of almonds or walnuts daily Recent heart study indicated that average follow-up on this kind of diet reduces the cardiovascular mortality by 50 to 70%==      Class 3 severe obesity due to excess calories with serious comorbidity and body mass index (BMI) of 40.0 to 44.9 in adult [facility])    - I encouraged the patient to lose weight.  - I educated them on making healthy dietary choices including eating more fruits and vegetables and less fried foods. - I encouraged the patient to exercise more, and educated on the benefits of exercise including weight loss, diabetes prevention, and hypertension prevention.   Dietary counseling with a registered dietician  Referral to a weight management support group (e.g. Weight Watchers, Overeaters Anonymous)  If your BMI is  greater than 29 or you have gained more than 15 pounds you should work on weight loss.  Attend a healthy cooking class          No orders of the defined types were placed in this encounter.   Follow-up: No follow-ups on file.    Cletis Athens, MD

## 2020-06-22 NOTE — Assessment & Plan Note (Signed)
Patient blood pressure is normal patient denies any chest pain or shortness of breath there is no history of palpitation or paroxysmal nocturnal dyspnea   patient was advised to follow low-salt low-cholesterol diet    ideally I want to keep systolic blood pressure below 130 mmHg, patient was asked to check blood pressure one times a week and give me a report on that.  Patient will be follow-up in 3 months  or earlier as needed, patient will call me back for any change in the cardiovascular symptoms    

## 2020-08-02 ENCOUNTER — Other Ambulatory Visit: Payer: Self-pay | Admitting: Internal Medicine

## 2020-08-05 ENCOUNTER — Other Ambulatory Visit: Payer: Self-pay | Admitting: Internal Medicine

## 2020-09-02 ENCOUNTER — Other Ambulatory Visit: Payer: Self-pay | Admitting: Internal Medicine

## 2020-09-06 ENCOUNTER — Other Ambulatory Visit: Payer: Self-pay | Admitting: Internal Medicine

## 2020-09-21 ENCOUNTER — Encounter: Payer: Medicare Other | Admitting: Internal Medicine

## 2020-10-26 ENCOUNTER — Other Ambulatory Visit: Payer: Self-pay | Admitting: Internal Medicine

## 2020-10-29 ENCOUNTER — Ambulatory Visit (INDEPENDENT_AMBULATORY_CARE_PROVIDER_SITE_OTHER): Payer: Medicare Other

## 2020-10-29 DIAGNOSIS — Z0000 Encounter for general adult medical examination without abnormal findings: Secondary | ICD-10-CM

## 2020-10-29 NOTE — Progress Notes (Signed)
Subjective:   Robert [name] is a 75 y.o. male who presents for Medicare Annual/Subsequent preventive examination. Visit done by audio.  Patient location: Home Provider location: Home  Review of Systems    N/A       Objective:    There were no vitals filed for this visit. There is no height or weight on file to calculate BMI.  Advanced Directives 10/29/2020 03/09/2018 03/09/2018 10/02/2017 08/21/2015  Does Patient Have a Medical Advance Directive? No No No No No  Would patient like information on creating a medical advance directive? No - Patient declined No - Patient declined - No - Patient declined -    Current Medications (verified) Outpatient Encounter Medications as of 10/29/2020  Medication Sig   amLODipine (NORVASC) 10 MG tablet TAKE 1 TABLET BY MOUTH DAILY   aspirin EC 81 MG tablet Take 81 mg by mouth daily.   BD PEN NEEDLE NANO 2ND GEN 32G X 4 MM MISC USE 1 PEN NEEDLE EVERY DAY AS DIRECTED   cloNIDine (CATAPRES) 0.3 MG tablet TAKE 1 TABLET BY MOUTH DAILY   clopidogrel (PLAVIX) 75 MG tablet TAKE 1 TABLET(75 MG) BY MOUTH DAILY   DULoxetine (CYMBALTA) 30 MG capsule TAKE 1 CAPSULE BY MOUTH EVERY 12 HOURS   furosemide (LASIX) 40 MG tablet TAKE 1 TABLET BY MOUTH EVERY DAY   hydrochlorothiazide (HYDRODIURIL) 25 MG tablet TAKE 1 TABLET BY MOUTH DAILY   LANTUS SOLOSTAR 100 UNIT/ML Solostar Pen INJECT 55 UNITS UNDER THE SKIN DAILY (Patient taking differently: Patient states he takes 46 units daily)   Latanoprost-Timolol Maleate 0.005-0.5 % SOLN INSTILL ONE DROP TO BOTH EYES AT BEDTIME   lisinopril (PRINIVIL,ZESTRIL) 5 MG tablet Take 5 mg by mouth daily.    meclizine (ANTIVERT) 12.5 MG tablet TAKE 1 TABLET(12.5 MG) BY MOUTH TWICE DAILY AS NEEDED   metoprolol succinate (TOPROL-XL) 100 MG 24 hr tablet TAKE 1 TABLET BY MOUTH EVERY 12 HOURS   potassium chloride (K-DUR,KLOR-CON) 10 MEQ tablet Take 10 mEq by mouth daily.    simvastatin (ZOCOR) 20 MG tablet TAKE 1 TABLET BY MOUTH EVERY  DAY   tolterodine (DETROL LA) 4 MG 24 hr capsule TAKE 1 CAPSULE BY MOUTH DAILY   [DISCONTINUED] cloNIDine (CATAPRES) 0.2 MG tablet Take 0.2 mg by mouth daily.  (Patient not taking: Reported on 10/29/2020)   [DISCONTINUED] JANUVIA 100 MG tablet Take 100 mg by mouth daily.  (Patient not taking: Reported on 10/29/2020)   [DISCONTINUED] lisinopril (ZESTRIL) 10 MG tablet TAKE 1/2 TABLET BY MOUTH DAILY (Patient not taking: Reported on 10/29/2020)   [DISCONTINUED] loratadine (CLARITIN) 10 MG tablet Take 10 mg by mouth daily as needed for allergies. (Patient not taking: Reported on 10/29/2020)   [DISCONTINUED] metFORMIN (GLUCOPHAGE) 1000 MG tablet Take 1,000 mg by mouth daily.  (Patient not taking: Reported on 10/29/2020)   No facility-administered encounter medications on file as of 10/29/2020.    Allergies (verified) Patient has no known allergies.   History: Past Medical History:  Diagnosis Date   Allergy    Cellulitis    Diabetes mellitus without complication ([place])    Hyperlipidemia    Hypertension    Peripheral vascular disease ([place])    Past Surgical History:  Procedure Laterality Date   COLONOSCOPY WITH PROPOFOL N/A 10/02/2017   Procedure: COLONOSCOPY WITH PROPOFOL;  Surgeon: Jonathon Bellows, MD;  Location[facility] ENDOSCOPY;  Service: Gastroenterology;  Laterality: N/A;   HERNIA REPAIR     Family History  Problem Relation Age of Onset  Heart disease Mother    Stroke Mother    Heart disease Father    Heart attack Father    Social History   Socioeconomic History   Marital status: Married    Spouse name: Not on file   Number of children: 0   Years of education: College Gradute   Highest education level: Bachelor's degree (e.g., BA, AB, BS)  Occupational History   Not on file  Tobacco Use   Smoking status: Never   Smokeless tobacco: Never  Vaping Use   Vaping Use: Never used  Substance and Sexual Activity   Alcohol use: Yes    Comment: Occasionally   Drug use: No   Sexual activity:  Not Currently  Other Topics Concern   Not on file  Social History Narrative   Not on file   Social Determinants of Health   Financial Resource Strain: Low Risk    Difficulty of Paying Living Expenses: Not hard at all  Food Insecurity: No Food Insecurity   Worried About Charity fundraiser in the Last Year: Never true   Ran Out of Food in the Last Year: Never true  Transportation Needs: No Transportation Needs   Lack of Transportation (Medical): No   Lack of Transportation (Non-Medical): No  Physical Activity: Inactive   Days of Exercise per Week: 0 days   Minutes of Exercise per Session: 0 min  Stress: No Stress Concern Present   Feeling of Stress : Not at all  Social Connections: Moderately Isolated   Frequency of Communication with Friends and Family: Once a week   Frequency of Social Gatherings with Friends and Family: Twice a week   Attends Religious Services: Never   Printmaker: No   Attends Music therapist: Never   Marital Status: Married    Tobacco Counseling Counseling given: Not Answered   Clinical Intake:  Pre-visit preparation completed: Yes  Pain : No/denies pain     Diabetes: Yes (BS 134 checked by patient)  How often do you need to have someone help you when you read instructions, pamphlets, or other written materials from your doctor or pharmacy?: 1 - Never What is the last grade level you completed in school?: [employer]  Diabetic? Yes  Interpreter Needed?: No      Activities of Daily Living In your present state of health, do you have any difficulty performing the following activities: 10/29/2020  Hearing? N  Vision? Y  Difficulty concentrating or making decisions? N  Walking or climbing stairs? Y  Dressing or bathing? N  Doing errands, shopping? N  Preparing Food and eating ? N  Using the Toilet? N  In the past six months, have you accidently leaked urine? Y  Do you have problems with  loss of bowel control? N  Managing your Medications? N  Managing your Finances? N  Housekeeping or managing your Housekeeping? N  Some recent data might be hidden    Patient Care Team: Cletis Athens, MD as PCP - General (Internal Medicine)  Indicate any recent Medical Services you may have received from other than [facility] providers in the past year (date may be approximate).     Assessment:   This is a routine wellness examination for [place].  Hearing/Vision screen No results found.  Dietary issues and exercise activities discussed:     Goals Addressed   None    Depression Screen PHQ 2/9 Scores 10/29/2020 06/22/2020  PHQ - 2 Score 0 0  Fall Risk Fall Risk  10/29/2020 06/22/2020  Falls in the past year? 1 1  Number falls in past yr: 1 1  Injury with Fall? 0 1  Risk for fall due to : History of fall(s);Impaired balance/gait History of fall(s)  Follow up Falls evaluation completed -    FALL RISK PREVENTION PERTAINING TO THE HOME:  Any stairs in or around the home? No  If so, are there any without handrails? No  Home free of loose throw rugs in walkways, pet beds, electrical cords, etc? Yes  Adequate lighting in your home to reduce risk of falls? Yes   ASSISTIVE DEVICES UTILIZED TO PREVENT FALLS:  Life alert? Yes  Use of a cane, walker or w/c? Yes  Grab bars in the bathroom? No  Shower chair or bench in shower? Yes  Elevated toilet seat or a handicapped toilet? No   TIMED UP AND GO:  Was the test performed? No .  Length of time to ambulate 10 feet: 0 sec.     Cognitive Function:     6CIT Screen 10/29/2020  What Year? 0 points  What month? 0 points  What time? 0 points  Count back from 20 0 points  Months in reverse 0 points  Repeat phrase 0 points  Total Score 0    Immunizations Immunization History  Administered Date(s) Administered   Fluad Quad(high Dose 65+) 10/22/2019   PFIZER(Purple Top)SARS-COV-2 Vaccination 05/10/2019, 06/10/2019   Tdap  08/21/2015    TDAP status: Up to date  Flu Vaccine status: Due, Education has been provided regarding the importance of this vaccine. Advised may receive this vaccine at local pharmacy or Health Dept. Aware to provide a copy of the vaccination record if obtained from local pharmacy or Health Dept. Verbalized acceptance and understanding.  Pneumococcal vaccine status: Due, Education has been provided regarding the importance of this vaccine. Advised may receive this vaccine at local pharmacy or Health Dept. Aware to provide a copy of the vaccination record if obtained from local pharmacy or Health Dept. Verbalized acceptance and understanding.  Covid-19 vaccine status: Information provided on how to obtain vaccines.   Qualifies for Shingles Vaccine? Yes   Zostavax completed No   Shingrix Completed?: No.    Education has been provided regarding the importance of this vaccine. Patient has been advised to call insurance company to determine out of pocket expense if they have not yet received this vaccine. Advised may also receive vaccine at local pharmacy or Health Dept. Verbalized acceptance and understanding.  Screening Tests Health Maintenance  Topic Date Due   FOOT EXAM  Never done   Hepatitis C Screening  Never done   Zoster Vaccines- Shingrix (1 of 2) Never done   PNA vac Low Risk Adult (1 of 2 - PCV13) Never done   COVID-19 Vaccine (3 - Booster for Pfizer series) 11/10/2019   INFLUENZA VACCINE  09/20/2020   OPHTHALMOLOGY EXAM  11/12/2020   HEMOGLOBIN A1C  12/16/2020   TETANUS/TDAP  08/20/2025   COLONOSCOPY (Pts 45-13yr Insurance coverage will need to be confirmed)  10/03/2027   HPV VACCINES  Aged Out    Health Maintenance  Health Maintenance Due  Topic Date Due   FOOT EXAM  Never done   Hepatitis C Screening  Never done   Zoster Vaccines- Shingrix (1 of 2) Never done   PNA vac Low Risk Adult (1 of 2 - PCV13) Never done   COVID-19 Vaccine (3 - [place]oster for [place]  11/10/2019  INFLUENZA VACCINE  09/20/2020    Colorectal cancer screening: Type of screening: Colonoscopy. Completed 10/02/2017. Repeat every 10 years (Patient will be aged out to repeat)  Lung Cancer Screening: (Low Dose CT Chest recommended if Age 39-80 years, 30 pack-year currently smoking OR have quit w/in 15years.) does not qualify.   Lung Cancer Screening Referral:   Additional Screening:  Hepatitis C Screening: does qualify; Completed No  Vision Screening: Recommended annual ophthalmology exams for early detection of glaucoma and other disorders of the eye. Is the patient up to date with their annual eye exam?  Yes  Who is the provider or what is the name of the office in which the patient attends annual eye exams? [facility] If pt is not established with a provider, would they like to be referred to a provider to establish care? No .   Dental Screening: Recommended annual dental exams for proper oral hygiene  Community Resource Referral / Chronic Care Management: CRR required this visit?  No   CCM required this visit?  No      Plan:     I have personally reviewed and noted the following in the patient's chart:   Medical and social history Use of alcohol, tobacco or illicit drugs  Current medications and supplements including opioid prescriptions. Patient is not currently taking opioid prescriptions. Functional ability and status Nutritional status Physical activity Advanced directives List of other physicians Hospitalizations, surgeries, and ER visits in previous 12 months Vitals Screenings to include cognitive, depression, and falls Referrals and appointments  In addition, I have reviewed and discussed with patient certain preventive protocols, quality metrics, and best practice recommendations. A written personalized care plan for preventive services as well as general preventive health recommendations were provided to patient.    Robert [name]  , Thank you for taking time to come for your Medicare Wellness Visit. I appreciate your ongoing commitment to your health goals. Please review the following plan we discussed and let me know if I can assist you in the future.   These are the goals we discussed:  Goals   None     This is a list of the screening recommended for you and due dates:  Health Maintenance  Topic Date Due   Complete foot exam   Never done   Hepatitis C Screening: USPSTF Recommendation to screen - Ages 56-79 yo.  Never done   Zoster (Shingles) Vaccine (1 of 2) Never done   Pneumonia vaccines (1 of 2 - PCV13) Never done   COVID-19 Vaccine (3 - Booster for Pfizer series) 11/10/2019   Flu Shot  09/20/2020   Eye exam for diabetics  11/12/2020   Hemoglobin A1C  12/16/2020   Tetanus Vaccine  08/20/2025   Colon Cancer Screening  10/03/2027   HPV Vaccine  Aged [address] [place], [place]   10/29/2020   Nurse Notes:

## 2020-10-30 NOTE — Progress Notes (Signed)
I have reviewed this visit and agree with the documentation.   

## 2020-11-10 ENCOUNTER — Ambulatory Visit (INDEPENDENT_AMBULATORY_CARE_PROVIDER_SITE_OTHER): Payer: Medicare Other | Admitting: Internal Medicine

## 2020-11-10 ENCOUNTER — Other Ambulatory Visit: Payer: Self-pay

## 2020-11-10 DIAGNOSIS — Z23 Encounter for immunization: Secondary | ICD-10-CM | POA: Diagnosis not present

## 2020-11-18 DIAGNOSIS — E119 Type 2 diabetes mellitus without complications: Secondary | ICD-10-CM | POA: Diagnosis not present

## 2020-11-18 DIAGNOSIS — H401131 Primary open-angle glaucoma, bilateral, mild stage: Secondary | ICD-10-CM | POA: Diagnosis not present

## 2020-11-18 LAB — HM DIABETES EYE EXAM

## 2020-11-20 ENCOUNTER — Other Ambulatory Visit: Payer: Self-pay | Admitting: Internal Medicine

## 2020-11-24 ENCOUNTER — Encounter: Payer: Self-pay | Admitting: *Deleted

## 2020-11-27 ENCOUNTER — Other Ambulatory Visit: Payer: Self-pay | Admitting: Internal Medicine

## 2020-11-29 ENCOUNTER — Other Ambulatory Visit: Payer: Self-pay | Admitting: Internal Medicine

## 2020-12-26 ENCOUNTER — Other Ambulatory Visit: Payer: Self-pay | Admitting: Internal Medicine

## 2021-01-21 ENCOUNTER — Other Ambulatory Visit: Payer: Self-pay | Admitting: Internal Medicine

## 2021-01-23 ENCOUNTER — Other Ambulatory Visit: Payer: Self-pay | Admitting: Internal Medicine

## 2021-02-14 ENCOUNTER — Other Ambulatory Visit: Payer: Self-pay | Admitting: Internal Medicine

## 2021-02-26 ENCOUNTER — Other Ambulatory Visit: Payer: Self-pay | Admitting: Internal Medicine

## 2021-03-16 ENCOUNTER — Encounter: Payer: Self-pay | Admitting: Internal Medicine

## 2021-03-16 ENCOUNTER — Ambulatory Visit (INDEPENDENT_AMBULATORY_CARE_PROVIDER_SITE_OTHER): Payer: Medicare Other | Admitting: Internal Medicine

## 2021-03-16 ENCOUNTER — Other Ambulatory Visit: Payer: Self-pay

## 2021-03-16 VITALS — BP 121/64 | HR 56 | Ht 74.0 in | Wt 339.2 lb

## 2021-03-16 DIAGNOSIS — I10 Essential (primary) hypertension: Secondary | ICD-10-CM | POA: Diagnosis not present

## 2021-03-16 DIAGNOSIS — E785 Hyperlipidemia, unspecified: Secondary | ICD-10-CM | POA: Diagnosis not present

## 2021-03-16 DIAGNOSIS — E118 Type 2 diabetes mellitus with unspecified complications: Secondary | ICD-10-CM

## 2021-03-16 DIAGNOSIS — Z6841 Body Mass Index (BMI) 40.0 and over, adult: Secondary | ICD-10-CM

## 2021-03-16 DIAGNOSIS — I872 Venous insufficiency (chronic) (peripheral): Secondary | ICD-10-CM

## 2021-03-16 LAB — POCT GLYCOSYLATED HEMOGLOBIN (HGB A1C): HbA1c POC (<> result, manual entry): 5.8 % (ref 4.0–5.6)

## 2021-03-16 LAB — GLUCOSE, POCT (MANUAL RESULT ENTRY): POC Glucose: 119 mg/dl — AB (ref 70–99)

## 2021-03-16 NOTE — Assessment & Plan Note (Signed)

## 2021-03-16 NOTE — Assessment & Plan Note (Signed)
-   Patient experiencing high levels of anxiety.  - Encouraged patient to engage in relaxing activities like yoga, meditation, journaling, going for a walk, or participating in a hobby.  - Encouraged patient to reach out to trusted friends or family members about recent struggles, Patient was advised to read A book, how to stop worrying and start living, it is good book to read to control  the stress  

## 2021-03-16 NOTE — Assessment & Plan Note (Signed)
Stable at the present time. 

## 2021-03-16 NOTE — Progress Notes (Signed)
Established Patient Office Visit  Subjective:  Patient ID: Robert [name], male    DOB: 05-20-1945  Age: 76 y.o. MRN: [id number]  CC:  Chief Complaint  Patient presents with   Diabetes    Diabetes   PAULMICHAEL SCHRECK presents for diabetes check  Past Medical History:  Diagnosis Date   Allergy    Cellulitis    Diabetes mellitus without complication ([place])    Hyperlipidemia    Hypertension    Peripheral vascular disease ([place])     Past Surgical History:  Procedure Laterality Date   COLONOSCOPY WITH PROPOFOL N/A 10/02/2017   Procedure: COLONOSCOPY WITH PROPOFOL;  Surgeon: Jonathon Bellows, MD;  Location: [facility] ENDOSCOPY;  Service: Gastroenterology;  Laterality: N/A;   HERNIA REPAIR      Family History  Problem Relation Age of Onset   Heart disease Mother    Stroke Mother    Heart disease Father    Heart attack Father     Social History   Socioeconomic History   Marital status: Married    Spouse name: Not on file   Number of children: 0   Years of education: College Gradute   Highest education level: Bachelor's degree (e.g., BA, AB, BS)  Occupational History   Not on file  Tobacco Use   Smoking status: Never   Smokeless tobacco: Never  Vaping Use   Vaping Use: Never used  Substance and Sexual Activity   Alcohol use: Yes    Comment: Occasionally   Drug use: No   Sexual activity: Not Currently  Other Topics Concern   Not on file  Social History Narrative   Not on file   Social Determinants of Health   Financial Resource Strain: Low Risk    Difficulty of Paying Living Expenses: Not hard at all  Food Insecurity: No Food Insecurity   Worried About Charity fundraiser in the Last Year: Never true   Ran Out of Food in the Last Year: Never true  Transportation Needs: No Transportation Needs   Lack of Transportation (Medical): No   Lack of Transportation (Non-Medical): No  Physical Activity: Inactive   Days of Exercise per Week: 0 days   Minutes of  Exercise per Session: 0 min  Stress: No Stress Concern Present   Feeling of Stress : Not at all  Social Connections: Moderately Isolated   Frequency of Communication with Friends and Family: Once a week   Frequency of Social Gatherings with Friends and Family: Twice a week   Attends Religious Services: Never   Marine scientist or Organizations: No   Attends Music therapist: Never   Marital Status: Married  Human resources officer Violence: Not At Risk   Fear of Current or Ex-Partner: No   Emotionally Abused: No   Physically Abused: No   Sexually Abused: No     Current Outpatient Medications:    amLODipine (NORVASC) 10 MG tablet, TAKE 1 TABLET BY MOUTH DAILY, Disp: 90 tablet, Rfl: 3   aspirin EC 81 MG tablet, Take 81 mg by mouth daily., Disp: , Rfl:    BD PEN NEEDLE NANO 2ND GEN 32G X 4 MM MISC, USE 1 PEN NEEDLE EVERY DAY AS DIRECTED, Disp: 100 each, Rfl: 4   cloNIDine (CATAPRES) 0.3 MG tablet, TAKE 1 TABLET BY MOUTH DAILY, Disp: 90 tablet, Rfl: 3   clopidogrel (PLAVIX) 75 MG tablet, TAKE 1 TABLET(75 MG) BY MOUTH DAILY, Disp: 90 tablet, Rfl: 2   clopidogrel (PLAVIX) 75  MG tablet, TAKE 1 TABLET(75 MG) BY MOUTH DAILY, Disp: 90 tablet, Rfl: 2   DULoxetine (CYMBALTA) 30 MG capsule, TAKE 1 CAPSULE BY MOUTH EVERY 12 HOURS, Disp: 60 capsule, Rfl: 3   furosemide (LASIX) 40 MG tablet, TAKE 1 TABLET BY MOUTH EVERY DAY, Disp: 90 tablet, Rfl: 3   hydrochlorothiazide (HYDRODIURIL) 25 MG tablet, TAKE 1 TABLET BY MOUTH DAILY, Disp: 90 tablet, Rfl: 3   LANTUS SOLOSTAR 100 UNIT/ML Solostar Pen, ADMINISTER 55 UNITS UNDER THE SKIN DAILY, Disp: 9 mL, Rfl: 6   Latanoprost-Timolol Maleate 0.005-0.5 % SOLN, INSTILL ONE DROP TO BOTH EYES AT BEDTIME, Disp: , Rfl:    lisinopril (PRINIVIL,ZESTRIL) 5 MG tablet, Take 5 mg by mouth daily. , Disp: , Rfl: 4   meclizine (ANTIVERT) 12.5 MG tablet, TAKE 1 TABLET(12.5 MG) BY MOUTH TWICE DAILY AS NEEDED, Disp: 30 tablet, Rfl: 0   metoprolol succinate  (TOPROL-XL) 100 MG 24 hr tablet, TAKE 1 TABLET BY MOUTH EVERY 12 HOURS, Disp: 180 tablet, Rfl: 3   potassium chloride (K-DUR,KLOR-CON) 10 MEQ tablet, Take 10 mEq by mouth daily. , Disp: , Rfl: 3   simvastatin (ZOCOR) 20 MG tablet, TAKE 1 TABLET BY MOUTH EVERY DAY, Disp: 90 tablet, Rfl: 1   tolterodine (DETROL LA) 4 MG 24 hr capsule, TAKE 1 CAPSULE BY MOUTH DAILY, Disp: 90 capsule, Rfl: 3   No Known Allergies  ROS Review of Systems  Constitutional: Negative.   HENT: Negative.    Eyes: Negative.   Respiratory: Negative.    Cardiovascular: Negative.   Gastrointestinal: Negative.   Endocrine: Negative.   Genitourinary: Negative.   Musculoskeletal: Negative.   Skin: Negative.   Allergic/Immunologic: Negative.   Neurological: Negative.   Hematological: Negative.   Psychiatric/Behavioral: Negative.    All other systems reviewed and are negative.    Objective:    Physical Exam Vitals reviewed.  Constitutional:      Appearance: Normal appearance.  HENT:     Mouth/Throat:     Mouth: Mucous membranes are moist.  Eyes:     Pupils: Pupils are equal, round, and reactive to light.  Neck:     Vascular: No carotid bruit.  Cardiovascular:     Rate and Rhythm: Normal rate and regular rhythm.     Pulses: Normal pulses.     Heart sounds: Normal heart sounds.  Pulmonary:     Effort: Pulmonary effort is normal.     Breath sounds: Normal breath sounds.  Abdominal:     General: Bowel sounds are normal.     Palpations: Abdomen is soft. There is no hepatomegaly, splenomegaly or mass.     Tenderness: There is no abdominal tenderness.     Hernia: No hernia is present.  Musculoskeletal:     Cervical back: Neck supple.     Right lower leg: No edema.     Left lower leg: No edema.  Skin:    Findings: No rash.  Neurological:     Mental Status: He is alert and oriented to person, place, and time.     Motor: No weakness.  Psychiatric:        Mood and Affect: Mood normal.        Behavior:  Behavior normal.    BP 121/64    Pulse (!) 56    Ht 6\' 2"  (1.88 m)    Wt (!) 339 lb 3.2 oz (153.9 kg)    BMI 43.55 kg/m  Wt Readings from Last 3 Encounters:  03/16/21 (!) 339  lb 3.2 oz (153.9 kg)  06/22/20 (!) 331 lb 9.6 oz (150.4 kg)  03/23/20 (!) 335 lb 4.8 oz (152.1 kg)     Health Maintenance Due  Topic Date Due   Pneumonia Vaccine 45+ Years old (1 - PCV) Never done   FOOT EXAM  Never done   Hepatitis C Screening  Never done   Zoster Vaccines- Shingrix (1 of 2) Never done   COVID-19 Vaccine (3 - Booster for Pfizer series) 08/05/2019    There are no preventive care reminders to display for this patient.  Lab Results  Component Value Date   TSH 2.40 06/16/2020   Lab Results  Component Value Date   WBC 6.0 06/16/2020   HGB 13.3 06/16/2020   HCT 40.2 06/16/2020   MCV 95.5 06/16/2020   PLT 171 06/16/2020   Lab Results  Component Value Date   NA 145 06/16/2020   K 4.0 06/16/2020   CO2 26 06/16/2020   GLUCOSE 83 06/16/2020   BUN 29 (H) 06/16/2020   CREATININE 1.65 (H) 06/16/2020   BILITOT 0.4 06/16/2020   ALKPHOS 61 10/21/2018   AST 21 06/16/2020   ALT 16 06/16/2020   PROT 6.6 06/16/2020   ALBUMIN 3.9 10/21/2018   CALCIUM 9.8 06/16/2020   ANIONGAP 10 10/21/2018   Lab Results  Component Value Date   CHOL 148 06/16/2020   Lab Results  Component Value Date   HDL 38 (L) 06/16/2020   Lab Results  Component Value Date   LDLCALC 84 06/16/2020   Lab Results  Component Value Date   TRIG 164 (H) 06/16/2020   Lab Results  Component Value Date   CHOLHDL 3.9 06/16/2020   Lab Results  Component Value Date   HGBA1C 5.8 03/16/2021      Assessment & Plan:   Problem List Items Addressed This Visit       Cardiovascular and Mediastinum   Essential hypertension     Patient denies any chest pain or shortness of breath there is no history of palpitation or paroxysmal nocturnal dyspnea   patient was advised to follow low-salt low-cholesterol diet    ideally  I want to keep systolic blood pressure below 130 mmHg, patient was asked to check blood pressure one times a week and give me a report on that.  Patient will be follow-up in 3 months  or earlier as needed, patient will call me back for any change in the cardiovascular symptoms Patient was advised to buy a book from local bookstore concerning blood pressure and read several chapters  every day.  This will be supplemented by some of the material we will give him from the office.  Patient should also utilize other resources like YouTube and Internet to learn more about the blood pressure and the diet.      Chronic venous insufficiency    Stable at the present time        Endocrine   Type 2 diabetes mellitus with complication ([place]) - Primary    - Patient experiencing high levels of anxiety.  - Encouraged patient to engage in relaxing activities like yoga, meditation, journaling, going for a walk, or participating in a hobby.  - Encouraged patient to reach out to trusted friends or family members about recent struggles, Patient was advised to read A book, how to stop worrying and start living, it is good book to read to control  the stress       Relevant Orders   POCT glucose (manual  entry) (Completed)   POCT glycosylated hemoglobin (Hb A1C) (Completed)     Other   Hyperlipidemia    Hypercholesterolemia  I advised the patient to follow Mediterranean diet This diet is rich in fruits vegetables and whole grain, and This diet is also rich in fish and lean meat Patient should also eat a handful of almonds or walnuts daily Recent heart study indicated that average follow-up on this kind of diet reduces the cardiovascular mortality by 50 to 70%==      Class 3 severe obesity due to excess calories with serious comorbidity and body mass index (BMI) of 40.0 to 44.9 in adult [facility])    - I encouraged the patient to lose weight.  - I educated them on making healthy dietary choices including eating  more fruits and vegetables and less fried foods. - I encouraged the patient to exercise more, and educated on the benefits of exercise including weight loss, diabetes prevention, and hypertension prevention.   Dietary counseling with a registered dietician  Referral to a weight management support group (e.g. Weight Watchers, Overeaters Anonymous)  If your BMI is greater than 29 or you have gained more than 15 pounds you should work on weight loss.  Attend a healthy cooking class        No orders of the defined types were placed in this encounter.   Follow-up: No follow-ups on file.    Cletis Athens, MD

## 2021-03-16 NOTE — Assessment & Plan Note (Signed)

## 2021-03-16 NOTE — Assessment & Plan Note (Signed)
Hypercholesterolemia  I advised the patient to follow Mediterranean diet This diet is rich in fruits vegetables and whole grain, and This diet is also rich in fish and lean meat Patient should also eat a handful of almonds or walnuts daily Recent heart study indicated that average follow-up on this kind of diet reduces the cardiovascular mortality by 50 to 70%== 

## 2021-03-30 ENCOUNTER — Other Ambulatory Visit: Payer: Self-pay | Admitting: Internal Medicine

## 2021-04-24 ENCOUNTER — Other Ambulatory Visit: Payer: Self-pay | Admitting: Internal Medicine

## 2021-05-01 ENCOUNTER — Other Ambulatory Visit: Payer: Self-pay | Admitting: Internal Medicine

## 2021-05-06 ENCOUNTER — Other Ambulatory Visit: Payer: Self-pay | Admitting: Internal Medicine

## 2021-05-09 ENCOUNTER — Other Ambulatory Visit: Payer: Self-pay

## 2021-05-09 ENCOUNTER — Encounter: Payer: Self-pay | Admitting: Internal Medicine

## 2021-05-09 ENCOUNTER — Ambulatory Visit (INDEPENDENT_AMBULATORY_CARE_PROVIDER_SITE_OTHER): Payer: Medicare Other | Admitting: Internal Medicine

## 2021-05-09 VITALS — BP 146/82 | HR 82 | Ht 74.0 in | Wt 337.7 lb

## 2021-05-09 DIAGNOSIS — Z6841 Body Mass Index (BMI) 40.0 and over, adult: Secondary | ICD-10-CM

## 2021-05-09 DIAGNOSIS — I872 Venous insufficiency (chronic) (peripheral): Secondary | ICD-10-CM | POA: Diagnosis not present

## 2021-05-09 DIAGNOSIS — E785 Hyperlipidemia, unspecified: Secondary | ICD-10-CM | POA: Diagnosis not present

## 2021-05-09 DIAGNOSIS — I890 Lymphedema, not elsewhere classified: Secondary | ICD-10-CM

## 2021-05-09 DIAGNOSIS — E118 Type 2 diabetes mellitus with unspecified complications: Secondary | ICD-10-CM

## 2021-05-09 DIAGNOSIS — I10 Essential (primary) hypertension: Secondary | ICD-10-CM

## 2021-05-09 LAB — GLUCOSE, POCT (MANUAL RESULT ENTRY): POC Glucose: 116 mg/dl — AB (ref 70–99)

## 2021-05-09 NOTE — Progress Notes (Signed)
? ?Established Patient Office Visit ? ?Subjective:  ?Patient ID: Robert [name], male    DOB: [birth year]  Age: 76 y.o. MRN: [id number] ? ?CC:  ?Chief Complaint  ?Patient presents with  ? Diabetes  ? ? ?Diabetes ? ? ?Robert [name] presents for check up ? ?Past Medical History:  ?Diagnosis Date  ? Allergy   ? Cellulitis   ? Diabetes mellitus without complication ([place])   ? Hyperlipidemia   ? Hypertension   ? Peripheral vascular disease ([place])   ? ? ?Past Surgical History:  ?Procedure Laterality Date  ? COLONOSCOPY WITH PROPOFOL N/A 10/02/2017  ? Procedure: COLONOSCOPY WITH PROPOFOL;  Surgeon: Jonathon Bellows, MD;  Location: [facility] ENDOSCOPY;  Service: Gastroenterology;  Laterality: N/A;  ? HERNIA REPAIR    ? ? ?Family History  ?Problem Relation Age of Onset  ? Heart disease Mother   ? Stroke Mother   ? Heart disease Father   ? Heart attack Father   ? ? ?Social History  ? ?Socioeconomic History  ? Marital status: Married  ?  Spouse name: Not on file  ? Number of children: 0  ? Years of education: College Gradute  ? Highest education level: Bachelor's degree (e.g., BA, AB, BS)  ?Occupational History  ? Not on file  ?Tobacco Use  ? Smoking status: Never  ? Smokeless tobacco: Never  ?Vaping Use  ? Vaping Use: Never used  ?Substance and Sexual Activity  ? Alcohol use: Yes  ?  Comment: Occasionally  ? Drug use: No  ? Sexual activity: Not Currently  ?Other Topics Concern  ? Not on file  ?Social History Narrative  ? Not on file  ? ?Social Determinants of Health  ? ?Financial Resource Strain: Low Risk   ? Difficulty of Paying Living Expenses: Not hard at all  ?Food Insecurity: No Food Insecurity  ? Worried About Charity fundraiser in the Last Year: Never true  ? Ran Out of Food in the Last Year: Never true  ?Transportation Needs: No Transportation Needs  ? Lack of Transportation (Medical): No  ? Lack of Transportation (Non-Medical): No  ?Physical Activity: Inactive  ? Days of Exercise per Week: 0 days  ? Minutes of Exercise  per Session: 0 min  ?Stress: No Stress Concern Present  ? Feeling of Stress : Not at all  ?Social Connections: Moderately Isolated  ? Frequency of Communication with Friends and Family: Once a week  ? Frequency of Social Gatherings with Friends and Family: Twice a week  ? Attends Religious Services: Never  ? Active Member of Clubs or Organizations: No  ? Attends Archivist Meetings: Never  ? Marital Status: Married  ?Intimate Partner Violence: Not At Risk  ? Fear of Current or Ex-Partner: No  ? Emotionally Abused: No  ? Physically Abused: No  ? Sexually Abused: No  ? ? ? ?Current Outpatient Medications:  ?  amLODipine (NORVASC) 10 MG tablet, TAKE 1 TABLET BY MOUTH DAILY, Disp: 90 tablet, Rfl: 3 ?  aspirin EC 81 MG tablet, Take 81 mg by mouth daily., Disp: , Rfl:  ?  BD PEN NEEDLE NANO 2ND GEN 32G X 4 MM MISC, USE 1 PEN NEEDLE EVERY DAY AS DIRECTED, Disp: 100 each, Rfl: 4 ?  cloNIDine (CATAPRES) 0.3 MG tablet, TAKE 1 TABLET BY MOUTH DAILY, Disp: 90 tablet, Rfl: 3 ?  clopidogrel (PLAVIX) 75 MG tablet, TAKE 1 TABLET(75 MG) BY MOUTH DAILY, Disp: 90 tablet, Rfl: 2 ?  DULoxetine (CYMBALTA) 30  MG capsule, TAKE 1 CAPSULE BY MOUTH EVERY 12 HOURS, Disp: 60 capsule, Rfl: 3 ?  furosemide (LASIX) 40 MG tablet, TAKE 1 TABLET BY MOUTH EVERY DAY, Disp: 90 tablet, Rfl: 3 ?  hydrochlorothiazide (HYDRODIURIL) 25 MG tablet, TAKE 1 TABLET BY MOUTH DAILY, Disp: 90 tablet, Rfl: 3 ?  LANTUS SOLOSTAR 100 UNIT/ML Solostar Pen, ADMINISTER 55 UNITS UNDER THE SKIN DAILY, Disp: 9 mL, Rfl: 6 ?  Latanoprost-Timolol Maleate 0.005-0.5 % SOLN, INSTILL ONE DROP TO BOTH EYES AT BEDTIME, Disp: , Rfl:  ?  lisinopril (PRINIVIL,ZESTRIL) 5 MG tablet, Take 5 mg by mouth daily. , Disp: , Rfl: 4 ?  meclizine (ANTIVERT) 12.5 MG tablet, TAKE 1 TABLET(12.5 MG) BY MOUTH TWICE DAILY AS NEEDED, Disp: 30 tablet, Rfl: 0 ?  metoprolol succinate (TOPROL-XL) 100 MG 24 hr tablet, TAKE 1 TABLET BY MOUTH EVERY 12 HOURS, Disp: 180 tablet, Rfl: 3 ?  potassium  chloride (K-DUR,KLOR-CON) 10 MEQ tablet, Take 10 mEq by mouth daily. , Disp: , Rfl: 3 ?  simvastatin (ZOCOR) 20 MG tablet, TAKE 1 TABLET BY MOUTH DAILY, Disp: 90 tablet, Rfl: 1 ?  tolterodine (DETROL LA) 4 MG 24 hr capsule, TAKE 1 CAPSULE BY MOUTH DAILY, Disp: 90 capsule, Rfl: 3 ?  clopidogrel (PLAVIX) 75 MG tablet, TAKE 1 TABLET(75 MG) BY MOUTH DAILY, Disp: 90 tablet, Rfl: 2  ? ?No Known Allergies ? ?ROS ?Review of Systems  ?Constitutional: Negative.   ?HENT: Negative.    ?Eyes: Negative.   ?Respiratory: Negative.    ?Cardiovascular: Negative.   ?Gastrointestinal: Negative.   ?Endocrine: Negative.   ?Genitourinary: Negative.   ?Musculoskeletal: Negative.   ?Skin: Negative.   ?Allergic/Immunologic: Negative.   ?Neurological: Negative.   ?Hematological: Negative.   ?Psychiatric/Behavioral: Negative.    ?All other systems reviewed and are negative. ? ?  ?Objective:  ?  ?Physical Exam ?Vitals reviewed.  ?Constitutional:   ?   Appearance: Normal appearance.  ?HENT:  ?   Mouth/Throat:  ?   Mouth: Mucous membranes are moist.  ?Eyes:  ?   Pupils: Pupils are equal, round, and reactive to light.  ?Neck:  ?   Vascular: No carotid bruit.  ?Cardiovascular:  ?   Rate and Rhythm: Normal rate and regular rhythm.  ?   Pulses: Normal pulses.  ?   Heart sounds: Normal heart sounds.  ?Pulmonary:  ?   Effort: Pulmonary effort is normal.  ?   Breath sounds: Normal breath sounds.  ?Abdominal:  ?   General: Bowel sounds are normal.  ?   Palpations: Abdomen is soft. There is no hepatomegaly, splenomegaly or mass.  ?   Tenderness: There is no abdominal tenderness.  ?   Hernia: No hernia is present.  ?Musculoskeletal:  ?   Cervical back: Neck supple.  ?   Right lower leg: No edema.  ?   Left lower leg: No edema.  ?Skin: ?   Findings: No rash.  ?Neurological:  ?   Mental Status: He is alert and oriented to person, place, and time.  ?   Motor: No weakness.  ?Psychiatric:     ?   Mood and Affect: Mood normal.     ?   Behavior: Behavior normal.   ? ? ?BP (!) 146/82   Pulse 82   Ht '6\' 2"'$  (1.88 m)   Wt (!) 337 lb 11.2 oz (153.2 kg)   BMI 43.36 kg/m?  ?Wt Readings from Last 3 Encounters:  ?05/09/21 (!) 337 lb 11.2 oz (153.2 kg)  ?  03/16/21 (!) 339 lb 3.2 oz (153.9 kg)  ?06/22/20 (!) 331 lb 9.6 oz (150.4 kg)  ? ? ? ?Health Maintenance Due  ?Topic Date Due  ? FOOT EXAM  Never done  ? Hepatitis C Screening  Never done  ? Zoster Vaccines- Shingrix (1 of 2) Never done  ? Pneumonia Vaccine 13+ Years old (1 - PCV) Never done  ? COVID-19 Vaccine (3 - Booster for Pfizer series) 08/05/2019  ? ? ?There are no preventive care reminders to display for this patient. ? ?Lab Results  ?Component Value Date  ? TSH 2.40 06/16/2020  ? ?Lab Results  ?Component Value Date  ? WBC 6.0 06/16/2020  ? HGB 13.3 06/16/2020  ? HCT 40.2 06/16/2020  ? MCV 95.5 06/16/2020  ? PLT 171 06/16/2020  ? ?Lab Results  ?Component Value Date  ? NA 145 06/16/2020  ? K 4.0 06/16/2020  ? CO2 26 06/16/2020  ? GLUCOSE 83 06/16/2020  ? BUN 29 (H) 06/16/2020  ? CREATININE 1.65 (H) 06/16/2020  ? BILITOT 0.4 06/16/2020  ? ALKPHOS 61 10/21/2018  ? AST 21 06/16/2020  ? ALT 16 06/16/2020  ? PROT 6.6 06/16/2020  ? ALBUMIN 3.9 10/21/2018  ? CALCIUM 9.8 06/16/2020  ? ANIONGAP 10 10/21/2018  ? ?Lab Results  ?Component Value Date  ? CHOL 148 06/16/2020  ? ?Lab Results  ?Component Value Date  ? HDL 38 (L) 06/16/2020  ? ?Lab Results  ?Component Value Date  ? [place] 84 06/16/2020  ? ?Lab Results  ?Component Value Date  ? TRIG 164 (H) 06/16/2020  ? ?Lab Results  ?Component Value Date  ? CHOLHDL 3.9 06/16/2020  ? ?Lab Results  ?Component Value Date  ? HGBA1C 5.8 03/16/2021  ? ? ?  ?Assessment & Plan:  ? ?Problem List Items Addressed This Visit   ? ?  ? Cardiovascular and Mediastinum  ? Essential hypertension  ?  Blood pressure is under control ?  ?  ? Chronic venous insufficiency  ?  Stable ?  ?  ?  ? Endocrine  ? Type 2 diabetes mellitus with complication (HCC) - Primary  ?  - The patient's blood sugar is labile on  med. ?- The patient will continue the current treatment regimen.  ?- I encouraged the patient to regularly check blood sugar.  ?- I encouraged the patient to monitor diet. I encouraged the patient to eat low-carb a

## 2021-05-09 NOTE — Assessment & Plan Note (Signed)
Stable

## 2021-05-09 NOTE — Assessment & Plan Note (Signed)
Hypercholesterolemia  I advised the patient to follow Mediterranean diet This diet is rich in fruits vegetables and whole grain, and This diet is also rich in fish and lean meat Patient should also eat a handful of almonds or walnuts daily Recent heart study indicated that average follow-up on this kind of diet reduces the cardiovascular mortality by 50 to 70%== 

## 2021-05-09 NOTE — Assessment & Plan Note (Signed)
Blood pressure is under control 

## 2021-05-09 NOTE — Assessment & Plan Note (Signed)

## 2021-05-09 NOTE — Assessment & Plan Note (Signed)

## 2021-05-19 DIAGNOSIS — H401131 Primary open-angle glaucoma, bilateral, mild stage: Secondary | ICD-10-CM | POA: Diagnosis not present

## 2021-05-27 ENCOUNTER — Other Ambulatory Visit: Payer: Self-pay | Admitting: Internal Medicine

## 2021-06-17 ENCOUNTER — Other Ambulatory Visit: Payer: Self-pay | Admitting: Internal Medicine

## 2021-07-12 ENCOUNTER — Ambulatory Visit: Payer: Medicare Other

## 2021-07-12 DIAGNOSIS — I10 Essential (primary) hypertension: Secondary | ICD-10-CM | POA: Diagnosis not present

## 2021-07-12 DIAGNOSIS — E118 Type 2 diabetes mellitus with unspecified complications: Secondary | ICD-10-CM

## 2021-07-13 LAB — CBC WITH DIFFERENTIAL/PLATELET
Absolute Monocytes: 637 cells/uL (ref 200–950)
Basophils Absolute: 32 cells/uL (ref 0–200)
Basophils Relative: 0.6 %
Eosinophils Absolute: 59 cells/uL (ref 15–500)
Eosinophils Relative: 1.1 %
HCT: 40 % (ref 38.5–50.0)
Hemoglobin: 13.1 g/dL — ABNORMAL LOW (ref 13.2–17.1)
Lymphs Abs: 3019 cells/uL (ref 850–3900)
MCH: 30.6 pg (ref 27.0–33.0)
MCHC: 32.8 g/dL (ref 32.0–36.0)
MCV: 93.5 fL (ref 80.0–100.0)
MPV: 10.3 fL (ref 7.5–12.5)
Monocytes Relative: 11.8 %
Neutro Abs: 1652 cells/uL (ref 1500–7800)
Neutrophils Relative %: 30.6 %
Platelets: 207 10*3/uL (ref 140–400)
RBC: 4.28 10*6/uL (ref 4.20–5.80)
RDW: 13.7 % (ref 11.0–15.0)
Total Lymphocyte: 55.9 %
WBC: 5.4 10*3/uL (ref 3.8–10.8)

## 2021-07-13 LAB — LIPID PANEL
Cholesterol: 128 mg/dL (ref <200)
HDL: 40 mg/dL (ref >=40)
LDL Cholesterol (Calc): 66 mg/dL (calc)
Non-HDL Cholesterol (Calc): 88 mg/dL (calc) (ref <130)
Total CHOL/HDL Ratio: 3.2 (calc) (ref <5.0)
Triglycerides: 135 mg/dL (ref <150)

## 2021-07-13 LAB — COMPLETE METABOLIC PANEL WITH GFR
AG Ratio: 1.5 (calc) (ref 1.0–2.5)
ALT: 16 U/L (ref 9–46)
AST: 25 U/L (ref 10–35)
Albumin: 4.2 g/dL (ref 3.6–5.1)
Alkaline phosphatase (APISO): 81 U/L (ref 35–144)
BUN/Creatinine Ratio: 20 (calc) (ref 6–22)
BUN: 34 mg/dL — ABNORMAL HIGH (ref 7–25)
CO2: 24 mmol/L (ref 20–32)
Calcium: 9.5 mg/dL (ref 8.6–10.3)
Chloride: 103 mmol/L (ref 98–110)
Creat: 1.69 mg/dL — ABNORMAL HIGH (ref 0.70–1.28)
Globulin: 2.8 g/dL (calc) (ref 1.9–3.7)
Glucose, Bld: 94 mg/dL (ref 65–99)
Potassium: 4.4 mmol/L (ref 3.5–5.3)
Sodium: 143 mmol/L (ref 135–146)
Total Bilirubin: 0.5 mg/dL (ref 0.2–1.2)
Total Protein: 7 g/dL (ref 6.1–8.1)
eGFR: 42 mL/min/{1.73_m2} — ABNORMAL LOW (ref >=60)

## 2021-07-13 LAB — HEMOGLOBIN A1C
Hgb A1c MFr Bld: 6.1 % of total Hgb — ABNORMAL HIGH (ref <5.7)
Mean Plasma Glucose: 128 mg/dL
eAG (mmol/L): 7.1 mmol/L

## 2021-07-13 LAB — TSH: TSH: 2.88 mIU/L (ref 0.40–4.50)

## 2021-07-13 LAB — PSA: PSA: 1.14 ng/mL (ref <=4.00)

## 2021-07-16 ENCOUNTER — Other Ambulatory Visit: Payer: Self-pay | Admitting: Internal Medicine

## 2021-08-19 ENCOUNTER — Other Ambulatory Visit: Payer: Self-pay | Admitting: Internal Medicine

## 2021-09-12 ENCOUNTER — Encounter: Payer: Self-pay | Admitting: Internal Medicine

## 2021-09-12 ENCOUNTER — Ambulatory Visit (INDEPENDENT_AMBULATORY_CARE_PROVIDER_SITE_OTHER): Payer: Medicare Other | Admitting: Internal Medicine

## 2021-09-12 VITALS — BP 138/71 | HR 59 | Ht 74.0 in | Wt 337.8 lb

## 2021-09-12 DIAGNOSIS — I10 Essential (primary) hypertension: Secondary | ICD-10-CM

## 2021-09-12 DIAGNOSIS — I872 Venous insufficiency (chronic) (peripheral): Secondary | ICD-10-CM

## 2021-09-12 DIAGNOSIS — E118 Type 2 diabetes mellitus with unspecified complications: Secondary | ICD-10-CM

## 2021-09-12 DIAGNOSIS — E785 Hyperlipidemia, unspecified: Secondary | ICD-10-CM

## 2021-09-12 DIAGNOSIS — Z6841 Body Mass Index (BMI) 40.0 and over, adult: Secondary | ICD-10-CM

## 2021-09-12 LAB — GLUCOSE, POCT (MANUAL RESULT ENTRY): POC Glucose: 131 mg/dl — AB (ref 70–99)

## 2021-09-12 NOTE — Assessment & Plan Note (Signed)

## 2021-09-12 NOTE — Assessment & Plan Note (Signed)

## 2021-09-12 NOTE — Assessment & Plan Note (Signed)
Hypercholesterolemia  I advised the patient to follow Mediterranean diet This diet is rich in fruits vegetables and whole grain, and This diet is also rich in fish and lean meat Patient should also eat a handful of almonds or walnuts daily Recent heart study indicated that average follow-up on this kind of diet reduces the cardiovascular mortality by 50 to 70%== 

## 2021-09-12 NOTE — Assessment & Plan Note (Signed)

## 2021-09-12 NOTE — Progress Notes (Signed)
Established Patient Office Visit  Subjective:  Patient ID: Robert [name], male    DOB: 02-17-1946  Age: 76 y.o. MRN: [id number]  CC:  Chief Complaint  Patient presents with   Diabetes    Patient is here for 3 month DM follow mup    Diabetes    Robert [name] presents for check up  Past Medical History:  Diagnosis Date   Allergy    Cellulitis    Diabetes mellitus without complication ([place])    Hyperlipidemia    Hypertension    Peripheral vascular disease ([place])     Past Surgical History:  Procedure Laterality Date   COLONOSCOPY WITH PROPOFOL N/A 10/02/2017   Procedure: COLONOSCOPY WITH PROPOFOL;  Surgeon: Jonathon Bellows, MD;  Location: [facility] ENDOSCOPY;  Service: Gastroenterology;  Laterality: N/A;   HERNIA REPAIR      Family History  Problem Relation Age of Onset   Heart disease Mother    Stroke Mother    Heart disease Father    Heart attack Father     Social History   Socioeconomic History   Marital status: Married    Spouse name: Not on file   Number of children: 0   Years of education: College Gradute   Highest education level: Bachelor's degree (e.g., BA, AB, BS)  Occupational History   Not on file  Tobacco Use   Smoking status: Never   Smokeless tobacco: Never  Vaping Use   Vaping Use: Never used  Substance and Sexual Activity   Alcohol use: Yes    Comment: Occasionally   Drug use: No   Sexual activity: Not Currently  Other Topics Concern   Not on file  Social History Narrative   Not on file   Social Determinants of Health   Financial Resource Strain: Low Risk  (10/29/2020)   Overall Financial Resource Strain (CARDIA)    Difficulty of Paying Living Expenses: Not hard at all  Food Insecurity: No Food Insecurity (10/29/2020)   Hunger Vital Sign    Worried About Running Out of Food in the Last Year: Never true    [place] in the Last Year: Never true  Transportation Needs: No Transportation Needs (10/29/2020)   PRAPARE -  Hydrologist (Medical): No    Lack of Transportation (Non-Medical): No  Physical Activity: Inactive (10/29/2020)   Exercise Vital Sign    Days of Exercise per Week: 0 days    Minutes of Exercise per Session: 0 min  Stress: No Stress Concern Present (10/29/2020)   [place]    Feeling of Stress : Not at all  Social Connections: Moderately Isolated (10/29/2020)   Social Connection and Isolation Panel [NHANES]    Frequency of Communication with Friends and Family: Once a week    Frequency of Social Gatherings with Friends and Family: Twice a week    Attends Religious Services: Never    Marine scientist or Organizations: No    Attends Archivist Meetings: Never    Marital Status: Married  Human resources officer Violence: Not At Risk (10/29/2020)   Humiliation, Afraid, Rape, and Kick questionnaire    Fear of Current or Ex-Partner: No    Emotionally Abused: No    Physically Abused: No    Sexually Abused: No     Current Outpatient Medications:    amLODipine (NORVASC) 10 MG tablet, TAKE 1 TABLET BY MOUTH DAILY, Disp: 90 tablet,  Rfl: 3   aspirin EC 81 MG tablet, Take 81 mg by mouth daily., Disp: , Rfl:    BD PEN NEEDLE NANO 2ND GEN 32G X 4 MM MISC, USE 1 PEN NEEDLE EVERY DAY AS DIRECTED, Disp: 100 each, Rfl: 4   cloNIDine (CATAPRES) 0.3 MG tablet, TAKE 1 TABLET BY MOUTH DAILY, Disp: 90 tablet, Rfl: 3   clopidogrel (PLAVIX) 75 MG tablet, TAKE 1 TABLET(75 MG) BY MOUTH DAILY, Disp: 90 tablet, Rfl: 2   DULoxetine (CYMBALTA) 30 MG capsule, TAKE 1 CAPSULE BY MOUTH EVERY 12 HOURS, Disp: 60 capsule, Rfl: 3   furosemide (LASIX) 40 MG tablet, TAKE 1 TABLET BY MOUTH EVERY DAY, Disp: 90 tablet, Rfl: 3   LANTUS SOLOSTAR 100 UNIT/ML Solostar Pen, ADMINISTER 55 UNITS UNDER THE SKIN DAILY, Disp: 9 mL, Rfl: 6   Latanoprost-Timolol Maleate 0.005-0.5 % SOLN, INSTILL ONE DROP TO BOTH EYES AT BEDTIME, Disp: , Rfl:     lisinopril (PRINIVIL,ZESTRIL) 5 MG tablet, Take 5 mg by mouth daily. , Disp: , Rfl: 4   meclizine (ANTIVERT) 12.5 MG tablet, TAKE 1 TABLET(12.5 MG) BY MOUTH TWICE DAILY AS NEEDED, Disp: 30 tablet, Rfl: 0   metoprolol succinate (TOPROL-XL) 100 MG 24 hr tablet, TAKE 1 TABLET BY MOUTH EVERY 12 HOURS, Disp: 180 tablet, Rfl: 3   potassium chloride (K-DUR,KLOR-CON) 10 MEQ tablet, Take 10 mEq by mouth daily. , Disp: , Rfl: 3   simvastatin (ZOCOR) 20 MG tablet, TAKE 1 TABLET BY MOUTH DAILY, Disp: 90 tablet, Rfl: 1   tolterodine (DETROL LA) 4 MG 24 hr capsule, TAKE 1 CAPSULE BY MOUTH DAILY, Disp: 90 capsule, Rfl: 3   No Known Allergies  ROS Review of Systems  Constitutional: Negative.   HENT: Negative.    Eyes: Negative.   Respiratory: Negative.    Cardiovascular: Negative.   Gastrointestinal: Negative.   Endocrine: Negative.   Genitourinary: Negative.   Musculoskeletal: Negative.   Skin: Negative.   Allergic/Immunologic: Negative.   Neurological: Negative.   Hematological: Negative.   Psychiatric/Behavioral: Negative.    All other systems reviewed and are negative.     Objective:    Physical Exam Vitals reviewed.  Constitutional:      Appearance: Normal appearance.  HENT:     Mouth/Throat:     Mouth: Mucous membranes are moist.  Eyes:     Pupils: Pupils are equal, round, and reactive to light.  Neck:     Vascular: No carotid bruit.  Cardiovascular:     Rate and Rhythm: Normal rate and regular rhythm.     Pulses: Normal pulses.     Heart sounds: Normal heart sounds.  Pulmonary:     Effort: Pulmonary effort is normal.     Breath sounds: Normal breath sounds.  Abdominal:     General: Bowel sounds are normal.     Palpations: Abdomen is soft. There is no hepatomegaly, splenomegaly or mass.     Tenderness: There is no abdominal tenderness.     Hernia: No hernia is present.  Musculoskeletal:     Cervical back: Neck supple.     Right lower leg: No edema.     Left lower  leg: No edema.  Skin:    Findings: No rash.  Neurological:     Mental Status: He is alert and oriented to person, place, and time.     Motor: No weakness.  Psychiatric:        Mood and Affect: Mood normal.  Behavior: Behavior normal.     BP 138/71   Pulse (!) 59   Ht 6' 2"  (1.88 m)   Wt (!) 337 lb 12.8 oz (153.2 kg)   BMI 43.37 kg/m  Wt Readings from Last 3 Encounters:  09/12/21 (!) 337 lb 12.8 oz (153.2 kg)  05/09/21 (!) 337 lb 11.2 oz (153.2 kg)  03/16/21 (!) 339 lb 3.2 oz (153.9 kg)     Health Maintenance Due  Topic Date Due   FOOT EXAM  Never done   Hepatitis C Screening  Never done   Zoster Vaccines- Shingrix (1 of 2) Never done   Pneumonia Vaccine 20+ Years old (1 - PCV) Never done   COVID-19 Vaccine (3 - Pfizer series) 08/05/2019    There are no preventive care reminders to display for this patient.  Lab Results  Component Value Date   TSH 2.88 07/12/2021   Lab Results  Component Value Date   WBC 5.4 07/12/2021   HGB 13.1 (L) 07/12/2021   HCT 40.0 07/12/2021   MCV 93.5 07/12/2021   PLT 207 07/12/2021   Lab Results  Component Value Date   NA 143 07/12/2021   K 4.4 07/12/2021   CO2 24 07/12/2021   GLUCOSE 94 07/12/2021   BUN 34 (H) 07/12/2021   CREATININE 1.69 (H) 07/12/2021   BILITOT 0.5 07/12/2021   ALKPHOS 61 10/21/2018   AST 25 07/12/2021   ALT 16 07/12/2021   PROT 7.0 07/12/2021   ALBUMIN 3.9 10/21/2018   CALCIUM 9.5 07/12/2021   ANIONGAP 10 10/21/2018   EGFR 42 (L) 07/12/2021   Lab Results  Component Value Date   CHOL 128 07/12/2021   Lab Results  Component Value Date   HDL 40 07/12/2021   Lab Results  Component Value Date   LDLCALC 66 07/12/2021   Lab Results  Component Value Date   TRIG 135 07/12/2021   Lab Results  Component Value Date   CHOLHDL 3.2 07/12/2021   Lab Results  Component Value Date   HGBA1C 6.1 (H) 07/12/2021      Assessment & Plan:   Problem List Items Addressed This Visit        Cardiovascular and Mediastinum   Essential hypertension     Patient denies any chest pain or shortness of breath there is no history of palpitation or paroxysmal nocturnal dyspnea   patient was advised to follow low-salt low-cholesterol diet    ideally I want to keep systolic blood pressure below 130 mmHg, patient was asked to check blood pressure one times a week and give me a report on that.  Patient will be follow-up in 3 months  or earlier as needed, patient will call me back for any change in the cardiovascular symptoms Patient was advised to buy a book from local bookstore concerning blood pressure and read several chapters  every day.  This will be supplemented by some of the material we will give him from the office.  Patient should also utilize other resources like YouTube and Internet to learn more about the blood pressure and the diet.      Chronic venous insufficiency    Stable at the present time        Endocrine   Type 2 diabetes mellitus with complication ([place]) - Primary    - The patient's blood sugar is labile on med. - The patient will continue the current treatment regimen.  - I encouraged the patient to regularly check blood sugar.  - I  encouraged the patient to monitor diet. I encouraged the patient to eat low-carb and low-sugar to help prevent blood sugar spikes.  - I encouraged the patient to continue following their prescribed treatment plan for diabetes - I informed the patient to get help if blood sugar drops below 34m/dL, or if suddenly have trouble thinking clearly or breathing.  Patient was advised to buy a book on diabetes from a local bookstore or from [place]  Patient should read 2 chapters every day to keep the motivation going, this is in addition to some of the materials we provided them from the office.  There are other resources on the Internet like YouTube and wilkipedia to get an education on the diabetes      Relevant Orders   POCT glucose (manual  entry) (Completed)     Other   Hyperlipidemia    Hypercholesterolemia  I advised the patient to follow Mediterranean diet This diet is rich in fruits vegetables and whole grain, and This diet is also rich in fish and lean meat Patient should also eat a handful of almonds or walnuts daily Recent heart study indicated that average follow-up on this kind of diet reduces the cardiovascular mortality by 50 to 70%==      Class 3 severe obesity due to excess calories with serious comorbidity and body mass index (BMI) of 40.0 to 44.9 in adult ([facility]    - I encouraged the patient to lose weight.  - I educated them on making healthy dietary choices including eating more fruits and vegetables and less fried foods. - I encouraged the patient to exercise more, and educated on the benefits of exercise including weight loss, diabetes prevention, and hypertension prevention.   Dietary counseling with a registered dietician  Referral to a weight management support group (e.g. Weight Watchers, Overeaters Anonymous)  If your BMI is greater than 29 or you have gained more than 15 pounds you should work on weight loss.  Attend a healthy cooking class        No orders of the defined types were placed in this encounter.   Follow-up: No follow-ups on file.    JCletis Athens MD

## 2021-09-12 NOTE — Assessment & Plan Note (Signed)
Stable at the present time. 

## 2021-09-13 ENCOUNTER — Other Ambulatory Visit: Payer: Self-pay | Admitting: Internal Medicine

## 2021-10-10 ENCOUNTER — Other Ambulatory Visit: Payer: Self-pay | Admitting: Internal Medicine

## 2021-10-11 ENCOUNTER — Other Ambulatory Visit: Payer: Self-pay | Admitting: *Deleted

## 2021-10-11 MED ORDER — DULOXETINE HCL 60 MG PO CPEP: 60.0000 mg | ORAL_CAPSULE | Freq: Two times a day (BID) | ORAL | 1 refills | Status: DC

## 2021-10-28 ENCOUNTER — Other Ambulatory Visit: Payer: Self-pay | Admitting: Internal Medicine

## 2021-11-09 ENCOUNTER — Other Ambulatory Visit: Payer: Self-pay | Admitting: Internal Medicine

## 2021-11-13 ENCOUNTER — Other Ambulatory Visit: Payer: Self-pay | Admitting: Internal Medicine

## 2021-11-17 DIAGNOSIS — H401131 Primary open-angle glaucoma, bilateral, mild stage: Secondary | ICD-10-CM | POA: Diagnosis not present

## 2021-11-17 LAB — HM DIABETES EYE EXAM

## 2021-11-22 ENCOUNTER — Encounter: Payer: Self-pay | Admitting: *Deleted

## 2021-11-27 ENCOUNTER — Other Ambulatory Visit: Payer: Self-pay | Admitting: Internal Medicine

## 2021-12-19 ENCOUNTER — Other Ambulatory Visit: Payer: Self-pay | Admitting: Internal Medicine

## 2021-12-28 ENCOUNTER — Ambulatory Visit: Payer: Medicare Other | Admitting: Internal Medicine

## 2022-01-06 ENCOUNTER — Ambulatory Visit (INDEPENDENT_AMBULATORY_CARE_PROVIDER_SITE_OTHER): Payer: Medicare Other

## 2022-01-06 DIAGNOSIS — Z23 Encounter for immunization: Secondary | ICD-10-CM | POA: Diagnosis not present

## 2022-01-28 ENCOUNTER — Other Ambulatory Visit: Payer: Self-pay | Admitting: Internal Medicine

## 2022-02-22 ENCOUNTER — Other Ambulatory Visit: Payer: Self-pay | Admitting: Internal Medicine

## 2022-03-10 ENCOUNTER — Other Ambulatory Visit: Payer: Self-pay | Admitting: Internal Medicine

## 2022-03-11 ENCOUNTER — Other Ambulatory Visit: Payer: Self-pay | Admitting: Internal Medicine

## 2022-03-16 ENCOUNTER — Other Ambulatory Visit: Payer: Self-pay | Admitting: Internal Medicine

## 2022-05-10 ENCOUNTER — Other Ambulatory Visit: Payer: Self-pay | Admitting: Nurse Practitioner

## 2022-08-10 ENCOUNTER — Other Ambulatory Visit: Payer: Self-pay

## 2022-08-10 ENCOUNTER — Encounter: Payer: Self-pay | Admitting: *Deleted

## 2022-08-10 ENCOUNTER — Inpatient Hospital Stay
Admission: EM | Admit: 2022-08-10 | Discharge: 2022-08-12 | DRG: 812 | Disposition: A | Discharge status: Home / Self Care | Payer: Medicare Other | Attending: Student in an Organized Health Care Education/Training Program | Admitting: Student in an Organized Health Care Education/Training Program

## 2022-08-10 ENCOUNTER — Emergency Department: Payer: Medicare Other

## 2022-08-10 DIAGNOSIS — D62 Acute posthemorrhagic anemia: Secondary | ICD-10-CM | POA: Diagnosis not present

## 2022-08-10 DIAGNOSIS — R0789 Other chest pain: Secondary | ICD-10-CM | POA: Diagnosis present

## 2022-08-10 DIAGNOSIS — Z7902 Long term (current) use of antithrombotics/antiplatelets: Secondary | ICD-10-CM

## 2022-08-10 DIAGNOSIS — Z794 Long term (current) use of insulin: Secondary | ICD-10-CM

## 2022-08-10 DIAGNOSIS — K921 Melena: Secondary | ICD-10-CM | POA: Diagnosis present

## 2022-08-10 DIAGNOSIS — F32A Depression, unspecified: Secondary | ICD-10-CM | POA: Diagnosis present

## 2022-08-10 DIAGNOSIS — Z87440 Personal history of urinary (tract) infections: Secondary | ICD-10-CM

## 2022-08-10 DIAGNOSIS — Z636 Dependent relative needing care at home: Secondary | ICD-10-CM

## 2022-08-10 DIAGNOSIS — Z79899 Other long term (current) drug therapy: Secondary | ICD-10-CM

## 2022-08-10 DIAGNOSIS — Z7982 Long term (current) use of aspirin: Secondary | ICD-10-CM

## 2022-08-10 DIAGNOSIS — Z823 Family history of stroke: Secondary | ICD-10-CM

## 2022-08-10 DIAGNOSIS — D649 Anemia, unspecified: Secondary | ICD-10-CM | POA: Diagnosis present

## 2022-08-10 DIAGNOSIS — E785 Hyperlipidemia, unspecified: Secondary | ICD-10-CM | POA: Diagnosis present

## 2022-08-10 DIAGNOSIS — I10 Essential (primary) hypertension: Secondary | ICD-10-CM | POA: Diagnosis present

## 2022-08-10 DIAGNOSIS — R42 Dizziness and giddiness: Secondary | ICD-10-CM | POA: Diagnosis not present

## 2022-08-10 DIAGNOSIS — Z8249 Family history of ischemic heart disease and other diseases of the circulatory system: Secondary | ICD-10-CM

## 2022-08-10 DIAGNOSIS — D509 Iron deficiency anemia, unspecified: Secondary | ICD-10-CM | POA: Diagnosis present

## 2022-08-10 DIAGNOSIS — N1831 Chronic kidney disease, stage 3a: Secondary | ICD-10-CM | POA: Diagnosis present

## 2022-08-10 DIAGNOSIS — E1122 Type 2 diabetes mellitus with diabetic chronic kidney disease: Secondary | ICD-10-CM | POA: Diagnosis present

## 2022-08-10 DIAGNOSIS — I872 Venous insufficiency (chronic) (peripheral): Secondary | ICD-10-CM | POA: Diagnosis present

## 2022-08-10 DIAGNOSIS — G4730 Sleep apnea, unspecified: Secondary | ICD-10-CM | POA: Diagnosis present

## 2022-08-10 DIAGNOSIS — I951 Orthostatic hypotension: Secondary | ICD-10-CM | POA: Diagnosis present

## 2022-08-10 DIAGNOSIS — E118 Type 2 diabetes mellitus with unspecified complications: Secondary | ICD-10-CM | POA: Diagnosis present

## 2022-08-10 DIAGNOSIS — E669 Obesity, unspecified: Secondary | ICD-10-CM

## 2022-08-10 DIAGNOSIS — Z6841 Body Mass Index (BMI) 40.0 and over, adult: Secondary | ICD-10-CM

## 2022-08-10 DIAGNOSIS — I1310 Hypertensive heart and chronic kidney disease without heart failure, with stage 1 through stage 4 chronic kidney disease, or unspecified chronic kidney disease: Secondary | ICD-10-CM | POA: Diagnosis present

## 2022-08-10 DIAGNOSIS — K219 Gastro-esophageal reflux disease without esophagitis: Secondary | ICD-10-CM | POA: Diagnosis present

## 2022-08-10 DIAGNOSIS — D696 Thrombocytopenia, unspecified: Secondary | ICD-10-CM | POA: Diagnosis present

## 2022-08-10 DIAGNOSIS — Z8719 Personal history of other diseases of the digestive system: Secondary | ICD-10-CM

## 2022-08-10 DIAGNOSIS — D638 Anemia in other chronic diseases classified elsewhere: Secondary | ICD-10-CM | POA: Diagnosis present

## 2022-08-10 DIAGNOSIS — E1151 Type 2 diabetes mellitus with diabetic peripheral angiopathy without gangrene: Secondary | ICD-10-CM | POA: Diagnosis present

## 2022-08-10 LAB — BASIC METABOLIC PANEL
Anion gap: 14 (ref 5–15)
BUN: 22 mg/dL (ref 8–23)
CO2: 21 mmol/L — ABNORMAL LOW (ref 22–32)
Calcium: 8.8 mg/dL — ABNORMAL LOW (ref 8.9–10.3)
Chloride: 102 mmol/L (ref 98–111)
Creatinine, Ser: 1.54 mg/dL — ABNORMAL HIGH (ref 0.61–1.24)
GFR, Estimated: 46 mL/min — ABNORMAL LOW (ref >60)
Glucose, Bld: 132 mg/dL — ABNORMAL HIGH (ref 70–99)
Potassium: 3.9 mmol/L (ref 3.5–5.1)
Sodium: 137 mmol/L (ref 135–145)

## 2022-08-10 LAB — CBC
HCT: 28.2 % — ABNORMAL LOW (ref 39.0–52.0)
Hemoglobin: 8.5 g/dL — ABNORMAL LOW (ref 13.0–17.0)
MCH: 26.9 pg (ref 26.0–34.0)
MCHC: 30.1 g/dL (ref 30.0–36.0)
MCV: 89.2 fL (ref 80.0–100.0)
Platelets: 120 10*3/uL — ABNORMAL LOW (ref 150–400)
RBC: 3.16 MIL/uL — ABNORMAL LOW (ref 4.22–5.81)
RDW: 17.3 % — ABNORMAL HIGH (ref 11.5–15.5)
WBC: 4 10*3/uL (ref 4.0–10.5)
nRBC: 0 % (ref 0.0–0.2)

## 2022-08-10 LAB — TROPONIN I (HIGH SENSITIVITY)
Troponin I (High Sensitivity): 4 ng/L (ref <18)
Troponin I (High Sensitivity): 5 ng/L (ref <18)

## 2022-08-10 LAB — CBG MONITORING, ED: Glucose-Capillary: 109 mg/dL — ABNORMAL HIGH (ref 70–99)
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
## 2022-08-11 ENCOUNTER — Inpatient Hospital Stay: Payer: Medicare Other

## 2022-08-11 ENCOUNTER — Encounter: Payer: Self-pay | Admitting: Internal Medicine

## 2022-08-11 ENCOUNTER — Encounter
Admission: EM | Disposition: A | Discharge status: Home / Self Care | Payer: Self-pay | Attending: Student in an Organized Health Care Education/Training Program

## 2022-08-11 ENCOUNTER — Inpatient Hospital Stay: Payer: Medicare Other | Admitting: Anesthesiology

## 2022-08-11 DIAGNOSIS — F32A Depression, unspecified: Secondary | ICD-10-CM | POA: Diagnosis present

## 2022-08-11 DIAGNOSIS — R42 Dizziness and giddiness: Secondary | ICD-10-CM

## 2022-08-11 DIAGNOSIS — I951 Orthostatic hypotension: Secondary | ICD-10-CM | POA: Diagnosis present

## 2022-08-11 DIAGNOSIS — D509 Iron deficiency anemia, unspecified: Secondary | ICD-10-CM | POA: Diagnosis present

## 2022-08-11 DIAGNOSIS — D696 Thrombocytopenia, unspecified: Secondary | ICD-10-CM | POA: Diagnosis present

## 2022-08-11 DIAGNOSIS — K219 Gastro-esophageal reflux disease without esophagitis: Secondary | ICD-10-CM | POA: Diagnosis present

## 2022-08-11 DIAGNOSIS — D649 Anemia, unspecified: Secondary | ICD-10-CM | POA: Diagnosis not present

## 2022-08-11 DIAGNOSIS — Z8249 Family history of ischemic heart disease and other diseases of the circulatory system: Secondary | ICD-10-CM | POA: Diagnosis not present

## 2022-08-11 DIAGNOSIS — I872 Venous insufficiency (chronic) (peripheral): Secondary | ICD-10-CM | POA: Diagnosis present

## 2022-08-11 DIAGNOSIS — G4730 Sleep apnea, unspecified: Secondary | ICD-10-CM | POA: Diagnosis present

## 2022-08-11 DIAGNOSIS — Z7982 Long term (current) use of aspirin: Secondary | ICD-10-CM | POA: Diagnosis not present

## 2022-08-11 DIAGNOSIS — Z823 Family history of stroke: Secondary | ICD-10-CM | POA: Diagnosis not present

## 2022-08-11 DIAGNOSIS — K921 Melena: Secondary | ICD-10-CM | POA: Diagnosis present

## 2022-08-11 DIAGNOSIS — R0789 Other chest pain: Secondary | ICD-10-CM | POA: Diagnosis present

## 2022-08-11 DIAGNOSIS — N1831 Chronic kidney disease, stage 3a: Secondary | ICD-10-CM | POA: Diagnosis present

## 2022-08-11 DIAGNOSIS — E1151 Type 2 diabetes mellitus with diabetic peripheral angiopathy without gangrene: Secondary | ICD-10-CM | POA: Diagnosis present

## 2022-08-11 DIAGNOSIS — D62 Acute posthemorrhagic anemia: Secondary | ICD-10-CM | POA: Diagnosis present

## 2022-08-11 DIAGNOSIS — E785 Hyperlipidemia, unspecified: Secondary | ICD-10-CM | POA: Diagnosis present

## 2022-08-11 DIAGNOSIS — Z6841 Body Mass Index (BMI) 40.0 and over, adult: Secondary | ICD-10-CM | POA: Diagnosis not present

## 2022-08-11 DIAGNOSIS — Z794 Long term (current) use of insulin: Secondary | ICD-10-CM | POA: Diagnosis not present

## 2022-08-11 DIAGNOSIS — D638 Anemia in other chronic diseases classified elsewhere: Secondary | ICD-10-CM | POA: Diagnosis present

## 2022-08-11 DIAGNOSIS — Z87440 Personal history of urinary (tract) infections: Secondary | ICD-10-CM | POA: Diagnosis not present

## 2022-08-11 DIAGNOSIS — E1122 Type 2 diabetes mellitus with diabetic chronic kidney disease: Secondary | ICD-10-CM | POA: Diagnosis present

## 2022-08-11 DIAGNOSIS — I1310 Hypertensive heart and chronic kidney disease without heart failure, with stage 1 through stage 4 chronic kidney disease, or unspecified chronic kidney disease: Secondary | ICD-10-CM | POA: Diagnosis present

## 2022-08-11 DIAGNOSIS — Z8719 Personal history of other diseases of the digestive system: Secondary | ICD-10-CM | POA: Diagnosis not present

## 2022-08-11 HISTORY — PX: ESOPHAGOGASTRODUODENOSCOPY (EGD) WITH PROPOFOL: SHX5813

## 2022-08-11 LAB — CBG MONITORING, ED
Glucose-Capillary: 116 mg/dL — ABNORMAL HIGH (ref 70–99)
Glucose-Capillary: 124 mg/dL — ABNORMAL HIGH (ref 70–99)
Glucose-Capillary: 134 mg/dL — ABNORMAL HIGH (ref 70–99)
Glucose-Capillary: 165 mg/dL — ABNORMAL HIGH (ref 70–99)

## 2022-08-11 LAB — IRON AND TIBC
Iron: 51 ug/dL (ref 45–182)
Saturation Ratios: 17 % — ABNORMAL LOW (ref 17.9–39.5)
TIBC: 301 ug/dL (ref 250–450)
UIBC: 250 ug/dL

## 2022-08-11 LAB — CBC
HCT: 24.1 % — ABNORMAL LOW (ref 39.0–52.0)
Hemoglobin: 7.1 g/dL — ABNORMAL LOW (ref 13.0–17.0)
MCH: 26.5 pg (ref 26.0–34.0)
MCHC: 29.5 g/dL — ABNORMAL LOW (ref 30.0–36.0)
MCV: 89.9 fL (ref 80.0–100.0)
Platelets: 98 10*3/uL — ABNORMAL LOW (ref 150–400)
RBC: 2.68 MIL/uL — ABNORMAL LOW (ref 4.22–5.81)
RDW: 17.5 % — ABNORMAL HIGH (ref 11.5–15.5)
WBC: 3.3 10*3/uL — ABNORMAL LOW (ref 4.0–10.5)
nRBC: 0 % (ref 0.0–0.2)

## 2022-08-11 LAB — HEMOGLOBIN
Hemoglobin: 8.2 g/dL — ABNORMAL LOW (ref 13.0–17.0)
Hemoglobin: 7.3 g/dL — ABNORMAL LOW (ref 13.0–17.0)
Hemoglobin: 7 g/dL — ABNORMAL LOW (ref 13.0–17.0)

## 2022-08-11 LAB — PROTIME-INR
INR: 1.2 (ref 0.8–1.2)
Prothrombin Time: 15.6 seconds — ABNORMAL HIGH (ref 11.4–15.2)

## 2022-08-11 LAB — GLUCOSE, CAPILLARY
Glucose-Capillary: 143 mg/dL — ABNORMAL HIGH (ref 70–99)
Glucose-Capillary: 194 mg/dL — ABNORMAL HIGH (ref 70–99)
Glucose-Capillary: 176 mg/dL — ABNORMAL HIGH (ref 70–99)

## 2022-08-11 LAB — FERRITIN: Ferritin: 155 ng/mL (ref 24–336)

## 2022-08-11 LAB — HEMOGLOBIN A1C
Hgb A1c MFr Bld: 5.4 % (ref 4.8–5.6)
Mean Plasma Glucose: 108.28 mg/dL

## 2022-08-11 LAB — APTT: aPTT: 29 seconds (ref 24–36)

## 2022-08-11 SURGERY — ESOPHAGOGASTRODUODENOSCOPY (EGD) WITH PROPOFOL
Anesthesia: General

## 2022-08-11 MED ORDER — INSULIN ASPART 100 UNIT/ML IJ SOLN
0.0000 [IU] | INTRAMUSCULAR | Status: DC
  Administered 2022-08-11: 2 [IU] via SUBCUTANEOUS
  Filled 2022-08-11: qty 1

## 2022-08-11 MED ORDER — SODIUM CHLORIDE 0.9 % IV BOLUS
500.0000 mL | Freq: Once | INTRAVENOUS | Status: AC
  Administered 2022-08-11: 500 mL via INTRAVENOUS

## 2022-08-11 MED ORDER — ONDANSETRON HCL 4 MG/2ML IJ SOLN: 4.0000 mg | Freq: Four times a day (QID) | INTRAMUSCULAR | Status: DC | PRN

## 2022-08-11 MED ORDER — ACETAMINOPHEN 650 MG RE SUPP: 650.0000 mg | Freq: Four times a day (QID) | RECTAL | Status: DC | PRN

## 2022-08-11 MED ORDER — PROPOFOL 1000 MG/100ML IV EMUL
INTRAVENOUS | Status: AC
  Filled 2022-08-11: qty 100

## 2022-08-11 MED ORDER — PANTOPRAZOLE SODIUM 40 MG IV SOLR: 40.0000 mg | INTRAVENOUS | Status: DC

## 2022-08-11 MED ORDER — PANTOPRAZOLE SODIUM 40 MG IV SOLR
40.0000 mg | Freq: Once | INTRAVENOUS | Status: AC
  Administered 2022-08-11: 40 mg via INTRAVENOUS
  Filled 2022-08-11: qty 10

## 2022-08-11 MED ORDER — SODIUM CHLORIDE 0.9 % IV SOLN
INTRAVENOUS | Status: DC
  Administered 2022-08-11: 100 mL/h via INTRAVENOUS

## 2022-08-11 MED ORDER — ACETAMINOPHEN 325 MG PO TABS
650.0000 mg | ORAL_TABLET | Freq: Four times a day (QID) | ORAL | Status: DC | PRN
  Administered 2022-08-12: 650 mg via ORAL
  Filled 2022-08-11: qty 2

## 2022-08-11 MED ORDER — INSULIN ASPART 100 UNIT/ML IJ SOLN
0.0000 [IU] | Freq: Three times a day (TID) | INTRAMUSCULAR | Status: DC
  Administered 2022-08-11: 2 [IU] via SUBCUTANEOUS
  Administered 2022-08-12 (×2): 3 [IU] via SUBCUTANEOUS
  Filled 2022-08-11 (×3): qty 1

## 2022-08-11 MED ORDER — ONDANSETRON HCL 4 MG PO TABS: 4.0000 mg | ORAL_TABLET | Freq: Four times a day (QID) | ORAL | Status: DC | PRN

## 2022-08-11 MED ORDER — PROPOFOL 500 MG/50ML IV EMUL
INTRAVENOUS | Status: DC | PRN
  Administered 2022-08-11: 150 ug/kg/min via INTRAVENOUS

## 2022-08-11 MED ORDER — LIDOCAINE HCL (CARDIAC) PF 100 MG/5ML IV SOSY
PREFILLED_SYRINGE | INTRAVENOUS | Status: DC | PRN
  Administered 2022-08-11: 50 mg via INTRAVENOUS

## 2022-08-11 MED ORDER — PROPOFOL 10 MG/ML IV BOLUS
INTRAVENOUS | Status: DC | PRN
  Administered 2022-08-11: 70 mg via INTRAVENOUS
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
## 2022-08-11 NOTE — Assessment & Plan Note (Signed)
Complicating factor to overall prognosis and care 

## 2022-08-11 NOTE — Assessment & Plan Note (Addendum)
Normotensive Will hold metoprolol and lisinopril as well as hydrochlorothiazide, furosemide and clonidine and amlodipine Hydralazine IV as needed while n.p.o. for BP control
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
## 2022-08-11 NOTE — Assessment & Plan Note (Signed)
Hold Lasix 
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
## 2022-08-11 NOTE — Anesthesia Procedure Notes (Signed)
Date/Time: 08/11/2022 12:55 PM  Performed by: Ginger Carne, CRNAPre-anesthesia Checklist: Patient identified, Emergency Drugs available, Suction available, Patient being monitored and Timeout performed Patient Re-evaluated:Patient Re-evaluated prior to induction Oxygen Delivery Method: Simple face mask Preoxygenation: Pre-oxygenation with 100% oxygen Induction Type: IV induction
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
## 2022-08-11 NOTE — Assessment & Plan Note (Signed)
Renal function at baseline 

## 2022-08-11 NOTE — Anesthesia Postprocedure Evaluation (Signed)
Anesthesia Post Note  Patient: ESHAWN COOR  Procedure(s) Performed: ESOPHAGOGASTRODUODENOSCOPY (EGD) WITH PROPOFOL  Patient location during evaluation: Endoscopy Anesthesia Type: General Level of consciousness: awake and alert Pain management: pain level controlled Vital Signs Assessment: post-procedure vital signs reviewed and stable Respiratory status: spontaneous breathing, nonlabored ventilation, respiratory function stable and patient connected to nasal cannula oxygen Cardiovascular status: blood pressure returned to baseline and stable Postop Assessment: no apparent nausea or vomiting Anesthetic complications: no   No notable events documented.   Last Vitals:  Vitals:   08/11/22 1339 08/11/22 1349  BP: (!) 144/79 (!) 143/78  Pulse: 74 74  Resp: 20 18  Temp:    SpO2: 94% 93%    Last Pain:  Vitals:   08/11/22 1349  TempSrc:   PainSc: 0-No pain                 Cleda Mccreedy Lamija Besse

## 2022-08-11 NOTE — Transfer of Care (Signed)
Immediate Anesthesia Transfer of Care Note  Patient: Robert [name]  Procedure(s) Performed: ESOPHAGOGASTRODUODENOSCOPY (EGD) WITH PROPOFOL  Patient Location: Endoscopy Unit  Anesthesia Type:General  Level of Consciousness: sedated  Airway & Oxygen Therapy: Patient Spontanous Breathing  Post-op Assessment: Report given to RN and Post -op Vital signs reviewed and stable  Post vital signs: Reviewed and stable  Last Vitals:  Vitals Value Taken Time  BP 91/51 08/11/22 1309  Temp 36.8 C 08/11/22 1309  Pulse 76 08/11/22 1309  Resp 18 08/11/22 1309  SpO2 94 % 08/11/22 1309    Last Pain:  Vitals:   08/11/22 1309  TempSrc: Temporal  PainSc: 0-No pain         Complications: No notable events documented.

## 2022-08-11 NOTE — Op Note (Signed)
[facility] Gastroenterology Patient Name: Robert [name] Procedure Date: 08/11/2022 11:47 AM MRN: [id number] Account #: [id number] Date of Birth: May 24, 1945 Admit Type: Inpatient Age: 77 Room: [facility] ENDO ROOM 4 Gender: Male Note Status: Finalized Instrument Name: Upper Endoscope [id number] Procedure:             Upper GI endoscopy Indications:           Acute post hemorrhagic anemia Providers:             Midge Minium MD, MD Referring MD:          Corky Downs, MD (Referring MD) Medicines:             Propofol per Anesthesia Complications:         No immediate complications. Procedure:             Pre-Anesthesia Assessment:                        - Prior to the procedure, a History and Physical was                         performed, and patient medications and allergies were                         reviewed. The patient's tolerance of previous                         anesthesia was also reviewed. The risks and benefits                         of the procedure and the sedation options and risks                         were discussed with the patient. All questions were                         answered, and informed consent was obtained. Prior                         Anticoagulants: The patient has taken no anticoagulant                         or antiplatelet agents. ASA Grade Assessment: II - A                         patient with mild systemic disease. After reviewing                         the risks and benefits, the patient was deemed in                         satisfactory condition to undergo the procedure.                        After obtaining informed consent, the endoscope was                         passed under direct vision. Throughout the procedure,  the patient's blood pressure, pulse, and oxygen                         saturations were monitored continuously. The Endoscope                         was introduced through the  mouth, and advanced to the                         second part of duodenum. The upper GI endoscopy was                         accomplished without difficulty. The patient tolerated                         the procedure well. Findings:      The esophagus was normal.      The stomach was normal.      The examined duodenum was normal. Impression:            - Normal esophagus.                        - Normal stomach.                        - Normal examined duodenum.                        - No specimens collected. Recommendation:        - Return patient to hospital ward for ongoing care.                        - Resume regular diet.                        - Continue present medications. Procedure Code(s):     --- Professional ---                        [phone], Esophagogastroduodenoscopy, flexible,                         transoral; diagnostic, including collection of                         specimen(s) by brushing or washing, when performed                         (separate procedure) Diagnosis Code(s):     --- Professional ---                        D62, Acute posthemorrhagic anemia CPT copyright 2022 American Medical Association. All rights reserved. The codes documented in this report are preliminary and upon coder review may  be revised to meet current compliance requirements. Midge Minium MD, MD 08/11/2022 1:15:10 PM This report has been signed electronically. Number of Addenda: 0 Note Initiated On: 08/11/2022 11:47 AM Estimated Blood Loss:  Estimated blood loss: none.      [facility]

## 2022-08-11 NOTE — Assessment & Plan Note (Signed)
Sliding scale insulin coverage 
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
## 2022-08-11 NOTE — Anesthesia Preprocedure Evaluation (Signed)
Anesthesia Evaluation  Patient identified by MRN, date of birth, ID band Patient awake    Reviewed: Allergy & Precautions, NPO status , Patient's Chart, lab work & pertinent test results  History of Anesthesia Complications Negative for: history of anesthetic complications  Airway Mallampati: III  TM Distance: >3 FB Neck ROM: full    Dental  (+) Chipped, Poor Dentition, Missing   Pulmonary shortness of breath and with exertion, sleep apnea    Pulmonary exam normal        Cardiovascular hypertension, (-) angina + Peripheral Vascular Disease  Normal cardiovascular exam     Neuro/Psych negative neurological ROS  negative psych ROS   GI/Hepatic negative GI ROS, Neg liver ROS,,,  Endo/Other  negative endocrine ROSdiabetes, Type 2    Renal/GU Renal disease  negative genitourinary   Musculoskeletal   Abdominal   Peds  Hematology  (+) Blood dyscrasia, anemia   Anesthesia Other Findings Past Medical History: No date: Allergy No date: Cellulitis No date: Diabetes mellitus without complication (HCC) No date: Hyperlipidemia No date: Hypertension No date: Peripheral vascular disease (HCC)  Past Surgical History: 10/02/2017: COLONOSCOPY WITH PROPOFOL; N/A     Comment:  Procedure: COLONOSCOPY WITH PROPOFOL;  Surgeon: Wyline Mood, MD;  Location: [facility] ENDOSCOPY;  Service:               Gastroenterology;  Laterality: N/A; No date: HERNIA REPAIR  BMI    Body Mass Index: 41.73 kg/m      Reproductive/Obstetrics negative OB ROS                             Anesthesia Physical Anesthesia Plan  ASA: 3  Anesthesia Plan: General   Post-op Pain Management:    Induction: Intravenous  PONV Risk Score and Plan: Propofol infusion and TIVA  Airway Management Planned: Natural Airway and Nasal Cannula  Additional Equipment:   Intra-op Plan:   Post-operative Plan:   Informed  Consent: I have reviewed the patients History and Physical, chart, labs and discussed the procedure including the risks, benefits and alternatives for the proposed anesthesia with the patient or authorized representative who has indicated his/her understanding and acceptance.     Dental Advisory Given  Plan Discussed with: Anesthesiologist, CRNA and Surgeon  Anesthesia Plan Comments: (Patient consented for risks of anesthesia including but not limited to:  - adverse reactions to medications - risk of airway placement if required - damage to eyes, teeth, lips or other oral mucosa - nerve damage due to positioning  - sore throat or hoarseness - Damage to heart, brain, nerves, lungs, other parts of body or loss of life  Patient voiced understanding.)       Anesthesia Quick Evaluation

## 2022-08-12 DIAGNOSIS — I951 Orthostatic hypotension: Secondary | ICD-10-CM | POA: Diagnosis not present

## 2022-08-12 DIAGNOSIS — K921 Melena: Secondary | ICD-10-CM

## 2022-08-12 DIAGNOSIS — D649 Anemia, unspecified: Secondary | ICD-10-CM | POA: Diagnosis not present

## 2022-08-12 LAB — CBC WITH DIFFERENTIAL/PLATELET
Abs Immature Granulocytes: 0.02 10*3/uL (ref 0.00–0.07)
Basophils Absolute: 0 10*3/uL (ref 0.0–0.1)
Basophils Relative: 0 %
Eosinophils Absolute: 0 10*3/uL (ref 0.0–0.5)
Eosinophils Relative: 0 %
HCT: 25.5 % — ABNORMAL LOW (ref 39.0–52.0)
Hemoglobin: 7.9 g/dL — ABNORMAL LOW (ref 13.0–17.0)
Immature Granulocytes: 1 %
Lymphocytes Relative: 71 %
Lymphs Abs: 2.7 10*3/uL (ref 0.7–4.0)
MCH: 27.1 pg (ref 26.0–34.0)
MCHC: 31 g/dL (ref 30.0–36.0)
MCV: 87.6 fL (ref 80.0–100.0)
Monocytes Absolute: 0.2 10*3/uL (ref 0.1–1.0)
Monocytes Relative: 6 %
Neutro Abs: 0.8 10*3/uL — ABNORMAL LOW (ref 1.7–7.7)
Neutrophils Relative %: 22 %
Platelets: 96 10*3/uL — ABNORMAL LOW (ref 150–400)
RBC: 2.91 MIL/uL — ABNORMAL LOW (ref 4.22–5.81)
RDW: 17.5 % — ABNORMAL HIGH (ref 11.5–15.5)
Smear Review: DECREASED
WBC: 3.7 10*3/uL — ABNORMAL LOW (ref 4.0–10.5)
nRBC: 0 % (ref 0.0–0.2)

## 2022-08-12 LAB — CBC
HCT: 23.5 % — ABNORMAL LOW (ref 39.0–52.0)
Hemoglobin: 6.9 g/dL — ABNORMAL LOW (ref 13.0–17.0)
MCH: 26.3 pg (ref 26.0–34.0)
MCHC: 29.4 g/dL — ABNORMAL LOW (ref 30.0–36.0)
MCV: 89.7 fL (ref 80.0–100.0)
Platelets: 93 10*3/uL — ABNORMAL LOW (ref 150–400)
RBC: 2.62 MIL/uL — ABNORMAL LOW (ref 4.22–5.81)
RDW: 17.6 % — ABNORMAL HIGH (ref 11.5–15.5)
WBC: 3.2 10*3/uL — ABNORMAL LOW (ref 4.0–10.5)
nRBC: 0 % (ref 0.0–0.2)

## 2022-08-12 LAB — PREPARE RBC (CROSSMATCH)

## 2022-08-12 LAB — BASIC METABOLIC PANEL
Anion gap: 8 (ref 5–15)
BUN: 16 mg/dL (ref 8–23)
CO2: 25 mmol/L (ref 22–32)
Calcium: 8.6 mg/dL — ABNORMAL LOW (ref 8.9–10.3)
Chloride: 108 mmol/L (ref 98–111)
Creatinine, Ser: 1.28 mg/dL — ABNORMAL HIGH (ref 0.61–1.24)
GFR, Estimated: 58 mL/min — ABNORMAL LOW (ref >60)
Glucose, Bld: 159 mg/dL — ABNORMAL HIGH (ref 70–99)
Potassium: 3.8 mmol/L (ref 3.5–5.1)
Sodium: 141 mmol/L (ref 135–145)

## 2022-08-12 LAB — ABO/RH: ABO/RH(D): AB POS

## 2022-08-12 LAB — GLUCOSE, CAPILLARY
Glucose-Capillary: 159 mg/dL — ABNORMAL HIGH (ref 70–99)
Glucose-Capillary: 190 mg/dL — ABNORMAL HIGH (ref 70–99)

## 2022-08-12 LAB — TYPE AND SCREEN
ABO/RH(D): AB POS
Antibody Screen: NEGATIVE

## 2022-08-12 MED ORDER — SODIUM CHLORIDE 0.9% IV SOLUTION: Freq: Once | INTRAVENOUS | Status: DC
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
## 2022-08-12 NOTE — TOC Progression Note (Signed)
Transition of Care [facility]) - Progression Note    Patient Details  Name: Robert [name] MRN: [id number] Date of Birth: 1945-08-01  [facility]) CM/SW Contact  Bing Quarry, RN Phone Number: 08/12/2022, 12:23 PM  Clinical Narrative: 6/22: Assistance with PCP requested by provider, local list of PCP's added to AVS summary. Gabriel Cirri RN CM            Expected Discharge Plan and Services                                               Social Determinants of Health (SDOH) Interventions SDOH Screenings   Food Insecurity: No Food Insecurity (08/11/2022)  Housing: Low Risk  (08/11/2022)  Transportation Needs: No Transportation Needs (08/11/2022)  Utilities: Not At Risk (08/11/2022)  Alcohol Screen: Low Risk  (10/29/2020)  Depression (PHQ2-9): Low Risk  (09/12/2021)  Financial Resource Strain: Low Risk  (10/29/2020)  Physical Activity: Inactive (10/29/2020)  Social Connections: Moderately Isolated (10/29/2020)  Stress: No Stress Concern Present (10/29/2020)  Tobacco Use: Low Risk  (08/11/2022)    Readmission Risk Interventions     No data to display

## 2022-08-12 NOTE — Plan of Care (Signed)
Pt d/c home, educated on getting labs. AVS questions answered. BP (!) 140/71   Pulse 97   Temp (!) 97.3 F (36.3 C) (Oral)   Resp 18   Ht 6\' 2"  (1.88 m)   Wt (!) 147.4 kg   SpO2 99%   BMI 41.73 kg/m  IV removed and tele removed. All belongings returned.  Pt escorted off floor by staff. Reva Bores 08/12/22 4:39 PM

## 2022-08-12 NOTE — Progress Notes (Signed)
   08/12/22 0600  Provider Notification  Provider Name/Title Sharyn Lull ,NP  Date Provider Notified 08/12/22  Time Provider Notified [phone]  Method of Notification Face-to-face  Notification Reason Critical Result  Test performed and critical result hgb 6.9  Date Critical Result Received 08/12/22  Time Critical Result Received  (not notified)  Provider response No new orders (defer to day team)  Date of Provider Response 08/12/22  Time of Provider Response [phone]

## 2022-08-12 NOTE — Discharge Instructions (Addendum)
Please follow up with your pcp or new provider within a week to get your hemoglobin rechecked.  I also placed a referral so you can follow up with gastroenterology for a colonoscopy in the near future. Your hemoglobin is 7.9 today. If you can't get in with a pcp by that time, please try an urgent care  Some PCP options in [facility] area- not a comprehensive list  [facility]- [phone] [facility]- [phone] [facility]- [phone] [facility]- [phone] [facility]- [phone] [facility]- [phone]  or [facility] Physician Referral Line [phone]

## 2022-08-13 LAB — BPAM RBC
Blood Product Expiration Date: 202407162359
ISSUE DATE / TIME: 202406220940
Unit Type and Rh: 6200

## 2022-08-13 LAB — TYPE AND SCREEN: Unit division: 0

## 2022-08-14 ENCOUNTER — Encounter: Payer: Self-pay | Admitting: Gastroenterology

## 2022-08-14 LAB — PATHOLOGIST SMEAR REVIEW

## 2022-08-18 ENCOUNTER — Emergency Department: Payer: Medicare Other

## 2022-08-18 ENCOUNTER — Other Ambulatory Visit: Payer: Self-pay

## 2022-08-18 ENCOUNTER — Inpatient Hospital Stay
Admission: EM | Admit: 2022-08-18 | Discharge: 2022-09-12 | DRG: 374 | Disposition: A | Discharge status: Skilled Nursing Facility | Payer: Medicare Other | Attending: Student | Admitting: Student

## 2022-08-18 ENCOUNTER — Other Ambulatory Visit: Payer: Self-pay | Admitting: Internal Medicine

## 2022-08-18 DIAGNOSIS — Y92410 Unspecified street and highway as the place of occurrence of the external cause: Secondary | ICD-10-CM | POA: Diagnosis not present

## 2022-08-18 DIAGNOSIS — I872 Venous insufficiency (chronic) (peripheral): Secondary | ICD-10-CM | POA: Diagnosis present

## 2022-08-18 DIAGNOSIS — I314 Cardiac tamponade: Secondary | ICD-10-CM | POA: Diagnosis not present

## 2022-08-18 DIAGNOSIS — Z823 Family history of stroke: Secondary | ICD-10-CM

## 2022-08-18 DIAGNOSIS — Z8249 Family history of ischemic heart disease and other diseases of the circulatory system: Secondary | ICD-10-CM

## 2022-08-18 DIAGNOSIS — K635 Polyp of colon: Secondary | ICD-10-CM | POA: Diagnosis present

## 2022-08-18 DIAGNOSIS — E861 Hypovolemia: Secondary | ICD-10-CM | POA: Diagnosis present

## 2022-08-18 DIAGNOSIS — I5033 Acute on chronic diastolic (congestive) heart failure: Secondary | ICD-10-CM | POA: Diagnosis not present

## 2022-08-18 DIAGNOSIS — E162 Hypoglycemia, unspecified: Secondary | ICD-10-CM | POA: Diagnosis not present

## 2022-08-18 DIAGNOSIS — N179 Acute kidney failure, unspecified: Secondary | ICD-10-CM | POA: Diagnosis present

## 2022-08-18 DIAGNOSIS — N189 Chronic kidney disease, unspecified: Secondary | ICD-10-CM | POA: Diagnosis not present

## 2022-08-18 DIAGNOSIS — E1122 Type 2 diabetes mellitus with diabetic chronic kidney disease: Secondary | ICD-10-CM | POA: Diagnosis present

## 2022-08-18 DIAGNOSIS — D61818 Other pancytopenia: Secondary | ICD-10-CM | POA: Diagnosis present

## 2022-08-18 DIAGNOSIS — R1310 Dysphagia, unspecified: Secondary | ICD-10-CM | POA: Diagnosis present

## 2022-08-18 DIAGNOSIS — K640 First degree hemorrhoids: Secondary | ICD-10-CM | POA: Diagnosis present

## 2022-08-18 DIAGNOSIS — E785 Hyperlipidemia, unspecified: Secondary | ICD-10-CM | POA: Diagnosis present

## 2022-08-18 DIAGNOSIS — Z794 Long term (current) use of insulin: Secondary | ICD-10-CM

## 2022-08-18 DIAGNOSIS — K922 Gastrointestinal hemorrhage, unspecified: Secondary | ICD-10-CM | POA: Diagnosis not present

## 2022-08-18 DIAGNOSIS — I5040 Unspecified combined systolic (congestive) and diastolic (congestive) heart failure: Secondary | ICD-10-CM | POA: Diagnosis not present

## 2022-08-18 DIAGNOSIS — Z7902 Long term (current) use of antithrombotics/antiplatelets: Secondary | ICD-10-CM

## 2022-08-18 DIAGNOSIS — K921 Melena: Secondary | ICD-10-CM | POA: Diagnosis present

## 2022-08-18 DIAGNOSIS — E11649 Type 2 diabetes mellitus with hypoglycemia without coma: Secondary | ICD-10-CM | POA: Diagnosis present

## 2022-08-18 DIAGNOSIS — J9601 Acute respiratory failure with hypoxia: Secondary | ICD-10-CM | POA: Diagnosis present

## 2022-08-18 DIAGNOSIS — E1151 Type 2 diabetes mellitus with diabetic peripheral angiopathy without gangrene: Secondary | ICD-10-CM | POA: Diagnosis present

## 2022-08-18 DIAGNOSIS — D638 Anemia in other chronic diseases classified elsewhere: Secondary | ICD-10-CM | POA: Diagnosis present

## 2022-08-18 DIAGNOSIS — R195 Other fecal abnormalities: Secondary | ICD-10-CM | POA: Diagnosis present

## 2022-08-18 DIAGNOSIS — Z66 Do not resuscitate: Secondary | ICD-10-CM | POA: Diagnosis present

## 2022-08-18 DIAGNOSIS — D649 Anemia, unspecified: Secondary | ICD-10-CM

## 2022-08-18 DIAGNOSIS — N261 Atrophy of kidney (terminal): Secondary | ICD-10-CM | POA: Diagnosis present

## 2022-08-18 DIAGNOSIS — Z7189 Other specified counseling: Secondary | ICD-10-CM | POA: Diagnosis not present

## 2022-08-18 DIAGNOSIS — N1831 Chronic kidney disease, stage 3a: Secondary | ICD-10-CM | POA: Diagnosis present

## 2022-08-18 DIAGNOSIS — C187 Malignant neoplasm of sigmoid colon: Principal | ICD-10-CM | POA: Diagnosis present

## 2022-08-18 DIAGNOSIS — E118 Type 2 diabetes mellitus with unspecified complications: Secondary | ICD-10-CM | POA: Diagnosis not present

## 2022-08-18 DIAGNOSIS — I951 Orthostatic hypotension: Secondary | ICD-10-CM | POA: Diagnosis not present

## 2022-08-18 DIAGNOSIS — E278 Other specified disorders of adrenal gland: Secondary | ICD-10-CM | POA: Diagnosis present

## 2022-08-18 DIAGNOSIS — D122 Benign neoplasm of ascending colon: Secondary | ICD-10-CM | POA: Diagnosis present

## 2022-08-18 DIAGNOSIS — K5900 Constipation, unspecified: Secondary | ICD-10-CM | POA: Diagnosis present

## 2022-08-18 DIAGNOSIS — E669 Obesity, unspecified: Secondary | ICD-10-CM | POA: Diagnosis present

## 2022-08-18 DIAGNOSIS — Z6841 Body Mass Index (BMI) 40.0 and over, adult: Secondary | ICD-10-CM

## 2022-08-18 DIAGNOSIS — Z8719 Personal history of other diseases of the digestive system: Secondary | ICD-10-CM

## 2022-08-18 DIAGNOSIS — I10 Essential (primary) hypertension: Secondary | ICD-10-CM | POA: Diagnosis not present

## 2022-08-18 DIAGNOSIS — I130 Hypertensive heart and chronic kidney disease with heart failure and stage 1 through stage 4 chronic kidney disease, or unspecified chronic kidney disease: Secondary | ICD-10-CM | POA: Diagnosis present

## 2022-08-18 DIAGNOSIS — D62 Acute posthemorrhagic anemia: Secondary | ICD-10-CM | POA: Diagnosis present

## 2022-08-18 DIAGNOSIS — R0602 Shortness of breath: Secondary | ICD-10-CM

## 2022-08-18 DIAGNOSIS — I3139 Other pericardial effusion (noninflammatory): Secondary | ICD-10-CM | POA: Diagnosis present

## 2022-08-18 DIAGNOSIS — Z79899 Other long term (current) drug therapy: Secondary | ICD-10-CM

## 2022-08-18 DIAGNOSIS — D123 Benign neoplasm of transverse colon: Secondary | ICD-10-CM | POA: Diagnosis present

## 2022-08-18 DIAGNOSIS — R21 Rash and other nonspecific skin eruption: Secondary | ICD-10-CM | POA: Diagnosis present

## 2022-08-18 DIAGNOSIS — K5730 Diverticulosis of large intestine without perforation or abscess without bleeding: Secondary | ICD-10-CM | POA: Diagnosis present

## 2022-08-18 DIAGNOSIS — Z515 Encounter for palliative care: Secondary | ICD-10-CM | POA: Diagnosis not present

## 2022-08-18 LAB — TYPE AND SCREEN

## 2022-08-18 LAB — CBC
HCT: 22.5 % — ABNORMAL LOW (ref 39.0–52.0)
Hemoglobin: 6.6 g/dL — ABNORMAL LOW (ref 13.0–17.0)
MCH: 26 pg (ref 26.0–34.0)
MCHC: 29.3 g/dL — ABNORMAL LOW (ref 30.0–36.0)
MCV: 88.6 fL (ref 80.0–100.0)
Platelets: 127 10*3/uL — ABNORMAL LOW (ref 150–400)
RBC: 2.54 MIL/uL — ABNORMAL LOW (ref 4.22–5.81)
RDW: 17.2 % — ABNORMAL HIGH (ref 11.5–15.5)
WBC: 4.8 10*3/uL (ref 4.0–10.5)
nRBC: 0.6 % — ABNORMAL HIGH (ref 0.0–0.2)

## 2022-08-18 LAB — BPAM RBC: ISSUE DATE / TIME: 202406282251

## 2022-08-18 LAB — BASIC METABOLIC PANEL
Anion gap: 13 (ref 5–15)
BUN: 30 mg/dL — ABNORMAL HIGH (ref 8–23)
CO2: 21 mmol/L — ABNORMAL LOW (ref 22–32)
Calcium: 8.6 mg/dL — ABNORMAL LOW (ref 8.9–10.3)
Chloride: 100 mmol/L (ref 98–111)
Creatinine, Ser: 1.84 mg/dL — ABNORMAL HIGH (ref 0.61–1.24)
GFR, Estimated: 37 mL/min — ABNORMAL LOW (ref >60)
Glucose, Bld: 109 mg/dL — ABNORMAL HIGH (ref 70–99)
Potassium: 3.7 mmol/L (ref 3.5–5.1)
Sodium: 134 mmol/L — ABNORMAL LOW (ref 135–145)

## 2022-08-18 LAB — TROPONIN I (HIGH SENSITIVITY)
Troponin I (High Sensitivity): 6 ng/L (ref <18)
Troponin I (High Sensitivity): 5 ng/L (ref <18)

## 2022-08-18 LAB — RETICULOCYTES
Immature Retic Fract: 25.7 % — ABNORMAL HIGH (ref 2.3–15.9)
RBC.: 2.52 MIL/uL — ABNORMAL LOW (ref 4.22–5.81)
Retic Count, Absolute: 120.2 10*3/uL (ref 19.0–186.0)
Retic Ct Pct: 4.8 % — ABNORMAL HIGH (ref 0.4–3.1)

## 2022-08-18 LAB — PREPARE RBC (CROSSMATCH)

## 2022-08-18 MED ORDER — PANTOPRAZOLE SODIUM 40 MG IV SOLR
40.0000 mg | INTRAVENOUS | Status: DC
  Administered 2022-08-18 – 2022-08-27 (×10): 40 mg via INTRAVENOUS
  Filled 2022-08-18 (×10): qty 10

## 2022-08-18 MED ORDER — SODIUM CHLORIDE 0.9 % IV SOLN
10.0000 mL/h | Freq: Once | INTRAVENOUS | Status: AC
  Administered 2022-08-21: 10 mL/h via INTRAVENOUS

## 2022-08-18 MED ORDER — IOHEXOL 300 MG/ML  SOLN
100.0000 mL | Freq: Once | INTRAMUSCULAR | Status: AC | PRN
  Administered 2022-08-18: 100 mL via INTRAVENOUS

## 2022-08-18 NOTE — Assessment & Plan Note (Signed)
This has developed over a period of 10 years based on the CAT scans in the system.  Patient does have CKD.  With AKI on top.  AKI is likely due to prerenal state from blood loss.  We will monitor response to IV hydration, check urinalysis sodium and creatinine.

## 2022-08-18 NOTE — ED Notes (Signed)
Pt in xray and will be taken to [address] once xray is done

## 2022-08-18 NOTE — Assessment & Plan Note (Signed)
Mixed density.  Will need dedicated imaging study for adrenal nodule.  At this time I will check serum metanephrines and DHEA-S.  Plan for dexamethasone suppression test after metanephrines are negative.
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
## 2022-08-18 NOTE — Assessment & Plan Note (Addendum)
Iron deficiency anemia likely due to chronic GI blood loss  Admitting hospitalist reported sending outpatient referral for hematology/oncology evaluation given finding of smudge cells on prior CBC  --Follow up with Hematology --IV iron infusion today  Anemia panel: iron low 35, sat ratio 13%, ferritin 350 slightly elevated, normal b12 and folate  WBC has normalized to 4.8 Platelets remain low but stable, slightly improved. --Daily CBC

## 2022-08-18 NOTE — ED Notes (Signed)
BLUE top sent down 

## 2022-08-18 NOTE — Assessment & Plan Note (Signed)
This is believed to be lower GI bleeding that has been going on since before August 11, 2022.  Patient had upper GI endoscopy done on August 11, 2022 which was normal, but patient left AMA prior to colonoscopy being completed.  At this time patient has been ordered for 2 unit PRBC, I will maintain the patient on IV hydration and check the CBC again at around midnight and then again tomorrow.  He already have a recent INR and PTT in the system.  CT angiogram has been completed as above with no finding of active bleeding.  I did consider colitis as a cause of recurrent bleeding.  However there is no fever no abdominal pain and no white count.  Further no finding of colitis on the CAT scan.  Similarly ER attending considered traumatic fistula formation as reason for bleeding however this is not apparent on CAT scan either.  We will have to proceed with colonoscopy and see what it shows.

## 2022-08-19 ENCOUNTER — Encounter: Payer: Self-pay | Admitting: Internal Medicine

## 2022-08-19 ENCOUNTER — Inpatient Hospital Stay (HOSPITAL_COMMUNITY)
Admit: 2022-08-19 | Discharge: 2022-08-19 | Disposition: A | Discharge status: Home / Self Care | Payer: Medicare Other | Attending: Internal Medicine | Admitting: Internal Medicine

## 2022-08-19 DIAGNOSIS — N1831 Chronic kidney disease, stage 3a: Secondary | ICD-10-CM | POA: Diagnosis not present

## 2022-08-19 DIAGNOSIS — E118 Type 2 diabetes mellitus with unspecified complications: Secondary | ICD-10-CM

## 2022-08-19 DIAGNOSIS — I3131 Malignant pericardial effusion in diseases classified elsewhere: Secondary | ICD-10-CM

## 2022-08-19 DIAGNOSIS — I3139 Other pericardial effusion (noninflammatory): Secondary | ICD-10-CM | POA: Diagnosis present

## 2022-08-19 DIAGNOSIS — K921 Melena: Secondary | ICD-10-CM

## 2022-08-19 DIAGNOSIS — N179 Acute kidney failure, unspecified: Secondary | ICD-10-CM | POA: Diagnosis not present

## 2022-08-19 LAB — URINALYSIS, COMPLETE (UACMP) WITH MICROSCOPIC
Bacteria, UA: NONE SEEN
Bilirubin Urine: NEGATIVE
Glucose, UA: NEGATIVE mg/dL
Hgb urine dipstick: NEGATIVE
Ketones, ur: 5 mg/dL — AB
Leukocytes,Ua: NEGATIVE
Nitrite: NEGATIVE
Protein, ur: 30 mg/dL — AB
Specific Gravity, Urine: 1.032 — ABNORMAL HIGH (ref 1.005–1.030)
Squamous Epithelial / HPF: NONE SEEN /HPF (ref 0–5)
pH: 5 (ref 5.0–8.0)

## 2022-08-19 LAB — HEMOGLOBIN AND HEMATOCRIT, BLOOD
HCT: 23.1 % — ABNORMAL LOW (ref 39.0–52.0)
HCT: 24.8 % — ABNORMAL LOW (ref 39.0–52.0)
Hemoglobin: 6.9 g/dL — ABNORMAL LOW (ref 13.0–17.0)
Hemoglobin: 7.7 g/dL — ABNORMAL LOW (ref 13.0–17.0)

## 2022-08-19 LAB — APTT: aPTT: 32 seconds (ref 24–36)

## 2022-08-19 LAB — BASIC METABOLIC PANEL WITH GFR
Anion gap: 12 (ref 5–15)
BUN: 26 mg/dL — ABNORMAL HIGH (ref 8–23)
CO2: 22 mmol/L (ref 22–32)
Calcium: 8.5 mg/dL — ABNORMAL LOW (ref 8.9–10.3)
Chloride: 105 mmol/L (ref 98–111)
Creatinine, Ser: 1.57 mg/dL — ABNORMAL HIGH (ref 0.61–1.24)
GFR, Estimated: 45 mL/min — ABNORMAL LOW
Glucose, Bld: 129 mg/dL — ABNORMAL HIGH (ref 70–99)
Potassium: 3.6 mmol/L (ref 3.5–5.1)
Sodium: 139 mmol/L (ref 135–145)

## 2022-08-19 LAB — PREPARE RBC (CROSSMATCH)

## 2022-08-19 LAB — CBC
HCT: 24.6 % — ABNORMAL LOW (ref 39.0–52.0)
Hemoglobin: 7.4 g/dL — ABNORMAL LOW (ref 13.0–17.0)
MCH: 26.4 pg (ref 26.0–34.0)
MCHC: 30.1 g/dL (ref 30.0–36.0)
MCV: 87.9 fL (ref 80.0–100.0)
Platelets: 126 10*3/uL — ABNORMAL LOW (ref 150–400)
RBC: 2.8 MIL/uL — ABNORMAL LOW (ref 4.22–5.81)
RDW: 16.9 % — ABNORMAL HIGH (ref 11.5–15.5)
WBC: 4.5 10*3/uL (ref 4.0–10.5)
nRBC: 0 % (ref 0.0–0.2)

## 2022-08-19 LAB — FOLATE: Folate: 16 ng/mL (ref >5.9)

## 2022-08-19 LAB — HEPATIC FUNCTION PANEL
ALT: 12 U/L (ref 0–44)
AST: 18 U/L (ref 15–41)
Albumin: 3.1 g/dL — ABNORMAL LOW (ref 3.5–5.0)
Alkaline Phosphatase: 64 U/L (ref 38–126)
Bilirubin, Direct: 0.2 mg/dL (ref 0.0–0.2)
Indirect Bilirubin: 0.8 mg/dL (ref 0.3–0.9)
Total Bilirubin: 1 mg/dL (ref 0.3–1.2)
Total Protein: 7.3 g/dL (ref 6.5–8.1)

## 2022-08-19 LAB — IRON AND TIBC
Iron: 35 ug/dL — ABNORMAL LOW (ref 45–182)
Saturation Ratios: 13 % — ABNORMAL LOW (ref 17.9–39.5)
TIBC: 260 ug/dL (ref 250–450)
UIBC: 225 ug/dL

## 2022-08-19 LAB — GLUCOSE, CAPILLARY
Glucose-Capillary: 148 mg/dL — ABNORMAL HIGH (ref 70–99)
Glucose-Capillary: 145 mg/dL — ABNORMAL HIGH (ref 70–99)
Glucose-Capillary: 94 mg/dL (ref 70–99)
Glucose-Capillary: 82 mg/dL (ref 70–99)

## 2022-08-19 LAB — ECHOCARDIOGRAM COMPLETE
Height: 74 in
Weight: 5255.77 oz

## 2022-08-19 LAB — CREATININE, URINE, RANDOM: Creatinine, Urine: 122 mg/dL

## 2022-08-19 LAB — PROTIME-INR
INR: 1.3 — ABNORMAL HIGH (ref 0.8–1.2)
Prothrombin Time: 16.2 seconds — ABNORMAL HIGH (ref 11.4–15.2)

## 2022-08-19 LAB — FERRITIN: Ferritin: 350 ng/mL — ABNORMAL HIGH (ref 24–336)

## 2022-08-19 LAB — BPAM RBC
Blood Product Expiration Date: 202407232359
ISSUE DATE / TIME: 202406291425
Unit Type and Rh: 6200

## 2022-08-19 LAB — SODIUM, URINE, RANDOM: Sodium, Ur: 44 mmol/L

## 2022-08-19 LAB — TYPE AND SCREEN

## 2022-08-19 LAB — VITAMIN B12: Vitamin B-12: 793 pg/mL (ref 180–914)

## 2022-08-19 LAB — CBG MONITORING, ED: Glucose-Capillary: 116 mg/dL — ABNORMAL HIGH (ref 70–99)

## 2022-08-19 MED ORDER — AMLODIPINE BESYLATE 10 MG PO TABS
10.0000 mg | ORAL_TABLET | Freq: Every day | ORAL | Status: DC
  Administered 2022-08-19: 10 mg via ORAL
  Filled 2022-08-19: qty 1

## 2022-08-19 MED ORDER — POLYETHYLENE GLYCOL 3350 17 G PO PACK: 17.0000 g | PACK | Freq: Every day | ORAL | Status: DC | PRN

## 2022-08-19 MED ORDER — SODIUM CHLORIDE 0.9% IV SOLUTION: Freq: Once | INTRAVENOUS | Status: AC

## 2022-08-19 MED ORDER — INSULIN ASPART 100 UNIT/ML IJ SOLN: 0.0000 [IU] | Freq: Every day | INTRAMUSCULAR | Status: DC

## 2022-08-19 MED ORDER — ACETAMINOPHEN 325 MG PO TABS
650.0000 mg | ORAL_TABLET | Freq: Four times a day (QID) | ORAL | Status: DC | PRN
  Administered 2022-08-19 – 2022-08-27 (×4): 650 mg via ORAL
  Filled 2022-08-19 (×4): qty 2

## 2022-08-19 MED ORDER — SODIUM CHLORIDE 0.9% FLUSH
3.0000 mL | Freq: Two times a day (BID) | INTRAVENOUS | Status: DC
  Administered 2022-08-19 – 2022-08-30 (×17): 3 mL via INTRAVENOUS

## 2022-08-19 MED ORDER — POLYETHYLENE GLYCOL 3350 17 G PO PACK
17.0000 g | PACK | Freq: Once | ORAL | Status: AC
  Administered 2022-08-19: 17 g via ORAL
  Filled 2022-08-19: qty 1

## 2022-08-19 MED ORDER — DOCUSATE SODIUM 100 MG PO CAPS
100.0000 mg | ORAL_CAPSULE | Freq: Two times a day (BID) | ORAL | Status: DC
  Administered 2022-08-19 – 2022-09-12 (×36): 100 mg via ORAL
  Filled 2022-08-19 (×42): qty 1

## 2022-08-19 MED ORDER — METOPROLOL SUCCINATE ER 100 MG PO TB24
100.0000 mg | ORAL_TABLET | Freq: Every day | ORAL | Status: DC
  Administered 2022-08-19 – 2022-09-09 (×22): 100 mg via ORAL
  Filled 2022-08-19: qty 2
  Filled 2022-08-19: qty 1
  Filled 2022-08-19: qty 2
  Filled 2022-08-19: qty 1
  Filled 2022-08-19: qty 2
  Filled 2022-08-19: qty 1
  Filled 2022-08-19 (×2): qty 2
  Filled 2022-08-19 (×2): qty 1
  Filled 2022-08-19 (×3): qty 2
  Filled 2022-08-19: qty 1
  Filled 2022-08-19 (×2): qty 2
  Filled 2022-08-19: qty 1
  Filled 2022-08-19 (×5): qty 2

## 2022-08-19 MED ORDER — SIMVASTATIN 20 MG PO TABS
20.0000 mg | ORAL_TABLET | Freq: Every day | ORAL | Status: DC
  Administered 2022-08-19 – 2022-09-11 (×24): 20 mg via ORAL
  Filled 2022-08-19 (×24): qty 1

## 2022-08-19 MED ORDER — PEG 3350-KCL-NA BICARB-NACL 420 G PO SOLR
4000.0000 mL | Freq: Once | ORAL | Status: AC
  Administered 2022-08-20: 4000 mL via ORAL
  Filled 2022-08-19: qty 4000

## 2022-08-19 MED ORDER — CLONIDINE HCL 0.1 MG PO TABS
0.3000 mg | ORAL_TABLET | Freq: Every day | ORAL | Status: DC
  Administered 2022-08-19 – 2022-09-07 (×20): 0.3 mg via ORAL
  Filled 2022-08-19 (×20): qty 3

## 2022-08-19 MED ORDER — DULOXETINE HCL 30 MG PO CPEP
60.0000 mg | ORAL_CAPSULE | Freq: Two times a day (BID) | ORAL | Status: DC
  Administered 2022-08-19 – 2022-09-12 (×48): 60 mg via ORAL
  Filled 2022-08-19 (×48): qty 2

## 2022-08-19 MED ORDER — INSULIN GLARGINE-YFGN 100 UNIT/ML ~~LOC~~ SOLN
55.0000 [IU] | Freq: Every day | SUBCUTANEOUS | Status: DC
  Administered 2022-08-19 – 2022-08-20 (×2): 55 [IU] via SUBCUTANEOUS
  Filled 2022-08-19 (×4): qty 0.55

## 2022-08-19 MED ORDER — PEG 3350-KCL-NA BICARB-NACL 420 G PO SOLR
2000.0000 mL | Freq: Once | ORAL | Status: AC | PRN
  Administered 2022-08-20: 2000 mL via ORAL
  Filled 2022-08-19: qty 4000

## 2022-08-19 MED ORDER — FESOTERODINE FUMARATE ER 4 MG PO TB24
4.0000 mg | ORAL_TABLET | Freq: Every day | ORAL | Status: DC
  Administered 2022-08-19 – 2022-09-12 (×25): 4 mg via ORAL
  Filled 2022-08-19 (×28): qty 1

## 2022-08-19 MED ORDER — ACETAMINOPHEN 650 MG RE SUPP: 650.0000 mg | Freq: Four times a day (QID) | RECTAL | Status: DC | PRN

## 2022-08-19 MED ORDER — SODIUM CHLORIDE 0.9 % IV SOLN: INTRAVENOUS | Status: DC

## 2022-08-19 MED ORDER — INSULIN ASPART 100 UNIT/ML IJ SOLN
0.0000 [IU] | Freq: Three times a day (TID) | INTRAMUSCULAR | Status: DC
  Administered 2022-08-19 (×2): 2 [IU] via SUBCUTANEOUS
  Administered 2022-08-20: 3 [IU] via SUBCUTANEOUS
  Administered 2022-08-20: 2 [IU] via SUBCUTANEOUS
  Administered 2022-08-21: 3 [IU] via SUBCUTANEOUS
  Administered 2022-08-22 (×2): 2 [IU] via SUBCUTANEOUS
  Administered 2022-08-22 – 2022-08-23 (×2): 3 [IU] via SUBCUTANEOUS
  Administered 2022-08-23 (×2): 2 [IU] via SUBCUTANEOUS
  Administered 2022-08-24: 3 [IU] via SUBCUTANEOUS
  Administered 2022-08-24 – 2022-08-28 (×5): 2 [IU] via SUBCUTANEOUS
  Administered 2022-08-30: 3 [IU] via SUBCUTANEOUS
  Administered 2022-08-30 – 2022-08-31 (×2): 2 [IU] via SUBCUTANEOUS
  Administered 2022-08-31 – 2022-09-01 (×2): 3 [IU] via SUBCUTANEOUS
  Administered 2022-09-01: 2 [IU] via SUBCUTANEOUS
  Administered 2022-09-02: 3 [IU] via SUBCUTANEOUS
  Administered 2022-09-03 – 2022-09-04 (×2): 2 [IU] via SUBCUTANEOUS
  Administered 2022-09-04 – 2022-09-05 (×2): 3 [IU] via SUBCUTANEOUS
  Administered 2022-09-05 – 2022-09-07 (×4): 2 [IU] via SUBCUTANEOUS
  Administered 2022-09-08: 3 [IU] via SUBCUTANEOUS
  Administered 2022-09-08 – 2022-09-09 (×2): 2 [IU] via SUBCUTANEOUS
  Administered 2022-09-10: 3 [IU] via SUBCUTANEOUS
  Administered 2022-09-10: 2 [IU] via SUBCUTANEOUS
  Administered 2022-09-11: 3 [IU] via SUBCUTANEOUS
  Administered 2022-09-11 – 2022-09-12 (×2): 2 [IU] via SUBCUTANEOUS
  Filled 2022-08-19 (×41): qty 1

## 2022-08-19 MED ORDER — BISACODYL 5 MG PO TBEC
10.0000 mg | DELAYED_RELEASE_TABLET | Freq: Once | ORAL | Status: AC
  Administered 2022-08-19: 10 mg via ORAL
  Filled 2022-08-19: qty 2
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
## 2022-08-19 NOTE — Assessment & Plan Note (Addendum)
Continue metoprolol, clonidine, amlodipine.  Home Lasix initially held with bleeding and AKI, while on fluids for NPO status.  Now on IV Lasix.

## 2022-08-19 NOTE — Assessment & Plan Note (Signed)
CKD stage IIIa AKI is likely due to prerenal from acute blood loss, hypovolemia.  Improving with IV hydration. --Continue IV fluids, on D5-LR @ 75 --Monitor BMP --Renally dose meds --Avoid nephrotoxins

## 2022-08-19 NOTE — Assessment & Plan Note (Signed)
No acute issues. Monitor. 

## 2022-08-19 NOTE — Plan of Care (Signed)
  Problem: Education: Goal: Ability to describe self-care measures that may prevent or decrease complications (Diabetes Survival Skills Education) will improve Outcome: Progressing Goal: Individualized Educational Video(s) Outcome: Progressing   Problem: Coping: Goal: Ability to adjust to condition or change in health will improve Outcome: Progressing   Problem: Fluid Volume: Goal: Ability to maintain a balanced intake and output will improve Outcome: Progressing   Problem: Health Behavior/Discharge Planning: Goal: Ability to identify and utilize available resources and services will improve Outcome: Progressing Goal: Ability to manage health-related needs will improve Outcome: Progressing   Problem: Metabolic: Goal: Ability to maintain appropriate glucose levels will improve Outcome: Progressing   Problem: Nutritional: Goal: Maintenance of adequate nutrition will improve Outcome: Progressing Goal: Progress toward achieving an optimal weight will improve Outcome: Progressing   Problem: Skin Integrity: Goal: Risk for impaired skin integrity will decrease Outcome: Progressing   Problem: Tissue Perfusion: Goal: Adequacy of tissue perfusion will improve Outcome: Progressing   Problem: Education: Goal: Knowledge of General Education information will improve Description: Including pain rating scale, medication(s)/side effects and non-pharmacologic comfort measures Outcome: Progressing   Problem: Health Behavior/Discharge Planning: Goal: Ability to manage health-related needs will improve Outcome: Progressing   Problem: Clinical Measurements: Goal: Ability to maintain clinical measurements within normal limits will improve Outcome: Progressing Goal: Will remain free from infection Outcome: Progressing Goal: Diagnostic test results will improve Outcome: Progressing Goal: Respiratory complications will improve Outcome: Progressing Goal: Cardiovascular complication will  be avoided Outcome: Progressing   Problem: Activity: Goal: Risk for activity intolerance will decrease Outcome: Progressing   Problem: Nutrition: Goal: Adequate nutrition will be maintained Outcome: Progressing   Problem: Coping: Goal: Level of anxiety will decrease Outcome: Progressing   Problem: Elimination: Goal: Will not experience complications related to bowel motility Outcome: Progressing Goal: Will not experience complications related to urinary retention Outcome: Progressing   Problem: Pain Managment: Goal: General experience of comfort will improve Outcome: Progressing   Problem: Safety: Goal: Ability to remain free from injury will improve Outcome: Progressing   Problem: Skin Integrity: Goal: Risk for impaired skin integrity will decrease Outcome: Progressing   Problem: Education: Goal: Ability to identify signs and symptoms of gastrointestinal bleeding will improve Outcome: Progressing   Problem: Bowel/Gastric: Goal: Will show no signs and symptoms of gastrointestinal bleeding Outcome: Progressing   Problem: Fluid Volume: Goal: Will show no signs and symptoms of excessive bleeding Outcome: Progressing   Problem: Clinical Measurements: Goal: Complications related to the disease process, condition or treatment will be avoided or minimized Outcome: Progressing   

## 2022-08-19 NOTE — ED Notes (Signed)
This nurse went to go retrieve the 2nd unit of blood from lab at this time. When this nurse arrived a the lab department, they said that Dr. Derrill Kay placed a note that the pt does not qualify for a 2nd unit". This nurse did not see this note in the chart review notes and the 2nd unit of blood is still active. The lab tech said to get a CBC to see pt's HGB and if its low then he can get the second unit of blood. This nurse was able to get new blood work from the pt and blood was sent to lab.

## 2022-08-19 NOTE — Assessment & Plan Note (Signed)
See AKI ?

## 2022-08-19 NOTE — Progress Notes (Signed)
  Echocardiogram 2D Echocardiogram has been performed.  Lenor Coffin 08/19/2022, 4:02 PM
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
## 2022-08-19 NOTE — Assessment & Plan Note (Signed)
On Semglee 55 units daily + sliding scale Novolog AC/HS Adjust insulin for goal 140-180

## 2022-08-19 NOTE — Assessment & Plan Note (Signed)
Body mass index is 42.18 kg/m. Complicates overall care and prognosis.  Recommend lifestyle modifications including physical activity and diet for weight loss and overall long-term health.

## 2022-08-20 DIAGNOSIS — K921 Melena: Secondary | ICD-10-CM | POA: Diagnosis not present

## 2022-08-20 DIAGNOSIS — E162 Hypoglycemia, unspecified: Secondary | ICD-10-CM | POA: Diagnosis not present

## 2022-08-20 LAB — CBC
HCT: 24.5 % — ABNORMAL LOW (ref 39.0–52.0)
Hemoglobin: 7.4 g/dL — ABNORMAL LOW (ref 13.0–17.0)
MCH: 26.5 pg (ref 26.0–34.0)
MCHC: 30.2 g/dL (ref 30.0–36.0)
MCV: 87.8 fL (ref 80.0–100.0)
Platelets: 126 10*3/uL — ABNORMAL LOW (ref 150–400)
RBC: 2.79 MIL/uL — ABNORMAL LOW (ref 4.22–5.81)
RDW: 17 % — ABNORMAL HIGH (ref 11.5–15.5)
WBC: 4.8 10*3/uL (ref 4.0–10.5)
nRBC: 0.4 % — ABNORMAL HIGH (ref 0.0–0.2)

## 2022-08-20 LAB — BASIC METABOLIC PANEL
Anion gap: 9 (ref 5–15)
BUN: 26 mg/dL — ABNORMAL HIGH (ref 8–23)
CO2: 20 mmol/L — ABNORMAL LOW (ref 22–32)
Calcium: 8.2 mg/dL — ABNORMAL LOW (ref 8.9–10.3)
Chloride: 108 mmol/L (ref 98–111)
Creatinine, Ser: 1.59 mg/dL — ABNORMAL HIGH (ref 0.61–1.24)
GFR, Estimated: 44 mL/min — ABNORMAL LOW (ref >60)
Glucose, Bld: 66 mg/dL — ABNORMAL LOW (ref 70–99)
Potassium: 3.8 mmol/L (ref 3.5–5.1)
Sodium: 137 mmol/L (ref 135–145)

## 2022-08-20 LAB — GLUCOSE, CAPILLARY
Glucose-Capillary: 70 mg/dL (ref 70–99)
Glucose-Capillary: 127 mg/dL — ABNORMAL HIGH (ref 70–99)
Glucose-Capillary: 153 mg/dL — ABNORMAL HIGH (ref 70–99)
Glucose-Capillary: 80 mg/dL (ref 70–99)

## 2022-08-20 LAB — ECHOCARDIOGRAM COMPLETE
AR max vel: 3.31 cm2
AV Peak grad: 5.8 mmHg
Ao pk vel: 1.2 m/s
Area-P 1/2: 3.81 cm2
S' Lateral: 4 cm
Single Plane A4C EF: 48.8 %

## 2022-08-20 LAB — TYPE AND SCREEN
ABO/RH(D): AB POS
Antibody Screen: NEGATIVE
Unit division: 0
Unit division: 0

## 2022-08-20 LAB — BPAM RBC
Blood Product Expiration Date: 202407172359
Unit Type and Rh: 7300

## 2022-08-20 LAB — HEMOGLOBIN AND HEMATOCRIT, BLOOD
HCT: 24.7 % — ABNORMAL LOW (ref 39.0–52.0)
Hemoglobin: 7.5 g/dL — ABNORMAL LOW (ref 13.0–17.0)

## 2022-08-20 MED ORDER — DEXTROSE IN LACTATED RINGERS 5 % IV SOLN
INTRAVENOUS | Status: DC
  Filled 2022-08-20: qty 1000
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
## 2022-08-20 NOTE — Assessment & Plan Note (Signed)
AM glucose 66 this AM (6/30). Due to minimal PO intake, on clear liquids pending colonoscopy tomorrow. --Continue D5-LR @ 75 cc/hr - continue until PO intake improves & sugars stabilized --Hypoglycemia protocol --Monitor CBG's
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
## 2022-08-21 ENCOUNTER — Inpatient Hospital Stay: Payer: Medicare Other | Admitting: Anesthesiology

## 2022-08-21 ENCOUNTER — Encounter
Admission: EM | Disposition: A | Discharge status: Skilled Nursing Facility | Payer: Self-pay | Source: Home / Self Care | Attending: Student

## 2022-08-21 ENCOUNTER — Encounter: Payer: Self-pay | Admitting: Internal Medicine

## 2022-08-21 DIAGNOSIS — N179 Acute kidney failure, unspecified: Secondary | ICD-10-CM | POA: Diagnosis not present

## 2022-08-21 DIAGNOSIS — D61818 Other pancytopenia: Secondary | ICD-10-CM

## 2022-08-21 DIAGNOSIS — K635 Polyp of colon: Secondary | ICD-10-CM | POA: Diagnosis present

## 2022-08-21 DIAGNOSIS — K921 Melena: Secondary | ICD-10-CM | POA: Diagnosis not present

## 2022-08-21 DIAGNOSIS — E162 Hypoglycemia, unspecified: Secondary | ICD-10-CM | POA: Diagnosis not present

## 2022-08-21 HISTORY — PX: POLYPECTOMY: SHX5525

## 2022-08-21 HISTORY — PX: BIOPSY: SHX5522

## 2022-08-21 HISTORY — PX: COLONOSCOPY WITH PROPOFOL: SHX5780

## 2022-08-21 LAB — CBC
HCT: 25 % — ABNORMAL LOW (ref 39.0–52.0)
Hemoglobin: 7.5 g/dL — ABNORMAL LOW (ref 13.0–17.0)
MCH: 26.6 pg (ref 26.0–34.0)
MCHC: 30 g/dL (ref 30.0–36.0)
MCV: 88.7 fL (ref 80.0–100.0)
Platelets: 130 10*3/uL — ABNORMAL LOW (ref 150–400)
RBC: 2.82 MIL/uL — ABNORMAL LOW (ref 4.22–5.81)
RDW: 16.8 % — ABNORMAL HIGH (ref 11.5–15.5)
WBC: 4.6 10*3/uL (ref 4.0–10.5)
nRBC: 0.7 % — ABNORMAL HIGH (ref 0.0–0.2)

## 2022-08-21 LAB — BASIC METABOLIC PANEL
Anion gap: 11 (ref 5–15)
BUN: 24 mg/dL — ABNORMAL HIGH (ref 8–23)
CO2: 20 mmol/L — ABNORMAL LOW (ref 22–32)
Calcium: 8.1 mg/dL — ABNORMAL LOW (ref 8.9–10.3)
Chloride: 106 mmol/L (ref 98–111)
Creatinine, Ser: 1.44 mg/dL — ABNORMAL HIGH (ref 0.61–1.24)
GFR, Estimated: 50 mL/min — ABNORMAL LOW (ref >60)
Glucose, Bld: 42 mg/dL — CL (ref 70–99)
Potassium: 3.8 mmol/L (ref 3.5–5.1)
Sodium: 137 mmol/L (ref 135–145)

## 2022-08-21 LAB — GLUCOSE, CAPILLARY
Glucose-Capillary: 108 mg/dL — ABNORMAL HIGH (ref 70–99)
Glucose-Capillary: 43 mg/dL — CL (ref 70–99)
Glucose-Capillary: 103 mg/dL — ABNORMAL HIGH (ref 70–99)
Glucose-Capillary: 101 mg/dL — ABNORMAL HIGH (ref 70–99)
Glucose-Capillary: 162 mg/dL — ABNORMAL HIGH (ref 70–99)
Glucose-Capillary: 161 mg/dL — ABNORMAL HIGH (ref 70–99)

## 2022-08-21 LAB — DHEA-SULFATE: DHEA-SO4: 31.8 ug/dL — ABNORMAL LOW (ref 20.8–226.4)

## 2022-08-21 SURGERY — COLONOSCOPY WITH PROPOFOL
Anesthesia: General

## 2022-08-21 MED ORDER — DEXTROSE 50 % IV SOLN
1.0000 | Freq: Once | INTRAVENOUS | Status: AC
  Administered 2022-08-21: 50 mL via INTRAVENOUS

## 2022-08-21 MED ORDER — PROPOFOL 10 MG/ML IV BOLUS
INTRAVENOUS | Status: DC | PRN
  Administered 2022-08-21: 80 ug/kg/min via INTRAVENOUS
  Administered 2022-08-21: 90 mg via INTRAVENOUS

## 2022-08-21 MED ORDER — EPHEDRINE 5 MG/ML INJ
INTRAVENOUS | Status: AC
  Filled 2022-08-21: qty 5

## 2022-08-21 MED ORDER — PROPOFOL 10 MG/ML IV BOLUS
INTRAVENOUS | Status: AC
  Filled 2022-08-21: qty 60

## 2022-08-21 MED ORDER — DEXTROSE 50 % IV SOLN
INTRAVENOUS | Status: AC
  Filled 2022-08-21: qty 50

## 2022-08-21 MED ORDER — PHENYLEPHRINE 80 MCG/ML (10ML) SYRINGE FOR IV PUSH (FOR BLOOD PRESSURE SUPPORT)
PREFILLED_SYRINGE | INTRAVENOUS | Status: DC | PRN
  Administered 2022-08-21: 80 ug via INTRAVENOUS
  Administered 2022-08-21 (×3): 160 ug via INTRAVENOUS
  Administered 2022-08-21 (×2): 80 ug via INTRAVENOUS

## 2022-08-21 MED ORDER — INSULIN GLARGINE-YFGN 100 UNIT/ML ~~LOC~~ SOLN: 30.0000 [IU] | Freq: Every day | SUBCUTANEOUS | Status: DC

## 2022-08-21 MED ORDER — EPHEDRINE SULFATE (PRESSORS) 50 MG/ML IJ SOLN
INTRAMUSCULAR | Status: DC | PRN
  Administered 2022-08-21: 5 mg via INTRAVENOUS
  Administered 2022-08-21: 10 mg via INTRAVENOUS
  Administered 2022-08-21 (×2): 5 mg via INTRAVENOUS

## 2022-08-21 MED ORDER — SODIUM CHLORIDE 0.9 % IV SOLN: INTRAVENOUS | Status: DC | PRN

## 2022-08-21 MED ORDER — LIDOCAINE HCL (PF) 2 % IJ SOLN
INTRAMUSCULAR | Status: AC
  Filled 2022-08-21: qty 5

## 2022-08-21 MED ORDER — INSULIN GLARGINE-YFGN 100 UNIT/ML ~~LOC~~ SOLN
25.0000 [IU] | Freq: Every day | SUBCUTANEOUS | Status: DC
  Administered 2022-08-22 – 2022-09-12 (×20): 25 [IU] via SUBCUTANEOUS
  Filled 2022-08-21 (×23): qty 0.25

## 2022-08-21 MED ORDER — SODIUM CHLORIDE (PF) 0.9 % IJ SOLN
INTRAMUSCULAR | Status: AC
  Filled 2022-08-21: qty 10

## 2022-08-21 MED ORDER — PHENYLEPHRINE 80 MCG/ML (10ML) SYRINGE FOR IV PUSH (FOR BLOOD PRESSURE SUPPORT)
PREFILLED_SYRINGE | INTRAVENOUS | Status: AC
  Filled 2022-08-21: qty 10

## 2022-08-21 MED ORDER — LIDOCAINE HCL (CARDIAC) PF 100 MG/5ML IV SOSY
PREFILLED_SYRINGE | INTRAVENOUS | Status: DC | PRN
  Administered 2022-08-21: 100 mg via INTRAVENOUS

## 2022-08-21 MED ORDER — PHENYLEPHRINE HCL (PRESSORS) 10 MG/ML IV SOLN
INTRAVENOUS | Status: AC
  Filled 2022-08-21: qty 1

## 2022-08-21 NOTE — Transfer of Care (Signed)
Immediate Anesthesia Transfer of Care Note  Patient: Robert [name]  Procedure(s) Performed: COLONOSCOPY WITH PROPOFOL POLYPECTOMY  Patient Location: Endoscopy Unit  Anesthesia Type:General  Level of Consciousness: drowsy  Airway & Oxygen Therapy: Patient Spontanous Breathing and Patient connected to face mask oxygen  Post-op Assessment: Report given to RN and Post -op Vital signs reviewed and stable  Post vital signs: Reviewed and stable  Last Vitals:  Vitals Value Taken Time  BP 115/61 08/21/22 1318  Temp 36.7 C 08/21/22 1317  Pulse 79 08/21/22 1319  Resp 16 08/21/22 1319  SpO2 97 % 08/21/22 1319  Vitals shown include unvalidated device data.  Last Pain:  Vitals:   08/21/22 1317  TempSrc: Temporal  PainSc:       Patients Stated Pain Goal: 0 (08/19/22 2000)  Complications: No notable events documented.

## 2022-08-21 NOTE — Assessment & Plan Note (Addendum)
50 mm sigmoid polyp, concerning for malignancy, seen on colonoscopy 08/21/22.  Likely bleeding source.   Makes adrenal lesion more concerning for metastatic disease. --GI following, see their recs --Follow biopsy pathology results --Outpatient Colo-rectal surgery follow up being arranged

## 2022-08-21 NOTE — Care Management Important Message (Signed)
Important Message  Patient Details  Name: Robert [name] MRN: [id number] Date of Birth: 06-30-1945   Medicare Important Message Given:  Yes     Olegario Messier A Alexus Galka 08/21/2022, 10:35 AM

## 2022-08-21 NOTE — Op Note (Signed)
[facility] Gastroenterology Patient Name: Robert [name] Procedure Date: 08/21/2022 11:39 AM MRN: [id number] Account #: [id number] Date of Birth: 1945-05-21 Admit Type: Inpatient Age: 77 Room: [facility] ENDO ROOM 2 Gender: Male Note Status: Finalized Instrument Name: Colonoscope [id number] Procedure:             Colonoscopy Indications:           Hematochezia Providers:             [name] DO, DO Medicines:             Monitored Anesthesia Care Complications:         No immediate complications. Estimated blood loss:                         Minimal. Procedure:             Pre-Anesthesia Assessment:                        - Prior to the procedure, a History and Physical was                         performed, and patient medications and allergies were                         reviewed. The patient is competent. The risks and                         benefits of the procedure and the sedation options and                         risks were discussed with the patient. All questions                         were answered and informed consent was obtained.                         Patient identification and proposed procedure were                         verified by the physician, the nurse, the anesthetist                         and the technician in the endoscopy suite. Mental                         Status Examination: alert and oriented. Airway                         Examination: normal oropharyngeal airway and neck                         mobility. Respiratory Examination: clear to                         auscultation. CV Examination: RRR, no murmurs, no S3                         or S4. Prophylactic Antibiotics: The patient does not  require prophylactic antibiotics. Prior                         Anticoagulants: The patient has taken Plavix                         (clopidogrel), last dose was 14 days prior to                          procedure. ASA Grade Assessment: III - A patient with                         severe systemic disease. After reviewing the risks and                         benefits, the patient was deemed in satisfactory                         condition to undergo the procedure. The anesthesia                         plan was to use monitored anesthesia care (MAC).                         Immediately prior to administration of medications,                         the patient was re-assessed for adequacy to receive                         sedatives. The heart rate, respiratory rate, oxygen                         saturations, blood pressure, adequacy of pulmonary                         ventilation, and response to care were monitored                         throughout the procedure. The physical status of the                         patient was re-assessed after the procedure.                        After obtaining informed consent, the colonoscope was                         passed under direct vision. Throughout the procedure,                         the patient's blood pressure, pulse, and oxygen                         saturations were monitored continuously. The                         Colonoscope was introduced through the anus and  advanced to the the terminal ileum, with                         identification of the appendiceal orifice and IC                         valve. The colonoscopy was somewhat difficult due to                         the patient's body habitus. Successful completion of                         the procedure was aided by straightening and                         shortening the scope to obtain bowel loop reduction                         and using scope torsion. The patient tolerated the                         procedure well. The quality of the bowel preparation                         was evaluated using the BBPS [facility] Bowel Preparation                          Scale) with scores of: Right Colon = 2 (minor amount                         of residual staining, small fragments of stool and/or                         opaque liquid, but mucosa seen well), Transverse Colon                         = 2 (minor amount of residual staining, small                         fragments of stool and/or opaque liquid, but mucosa                         seen well) and Left Colon = 2 (minor amount of                         residual staining, small fragments of stool and/or                         opaque liquid, but mucosa seen well). The total BBPS                         score equals 6. The quality of the bowel preparation                         was good. The terminal ileum, ileocecal valve,  appendiceal orifice, and rectum were photographed. Findings:      The perianal and digital rectal examinations were normal. Pertinent       negatives include normal sphincter tone.      The terminal ileum appeared normal. Estimated blood loss: none.      Scattered small-mouthed diverticula were found in the entire colon.       Estimated blood loss: none.      Non-bleeding internal hemorrhoids were found during retroflexion. The       hemorrhoids were Grade I (internal hemorrhoids that do not prolapse).      Five sessile polyps were found in the transverse colon and ascending       colon. The polyps were 3 to 6 mm in size. These polyps were removed with       a cold snare. Resection and retrieval were complete. Estimated blood       loss was minimal.      A large polyp was found in the proximal ascending colon. The polyp was       sessile. Spreading over folds. Unable to gain good positioning on area.       Would benefit from advanced endoscopy referral versus resection of this       area. Estimated blood loss: none.      A 50 mm polyp was found in the sigmoid colon. The polyp was umbilicated.       This was noted to be bookended by tattoos.  Mass was closes to the distal       tattoo (anus side). This was likely the source of bleeding and severe       anemia. Biopsies were taken with a cold forceps for histology. Estimated       blood loss was minimal.      The exam was otherwise without abnormality on direct and retroflexion       views. Impression:            - The examined portion of the ileum was normal.                        - Diverticulosis in the entire examined colon.                        - Non-bleeding internal hemorrhoids.                        - Five 3 to 6 mm polyps in the transverse colon and in                         the ascending colon, removed with a cold snare.                         Resected and retrieved.                        - One large polyp in the proximal ascending colon.                        - One 50 mm polyp in the sigmoid colon. Biopsied.                        - The examination was otherwise normal on direct and  retroflexion views. Recommendation:        - Patient has a contact number available for                         emergencies. The signs and symptoms of potential                         delayed complications were discussed with the patient.                         Return to normal activities tomorrow. Written                         discharge instructions were provided to the patient.                        - Return patient to hospital ward for ongoing care.                        - Full liquid diet.                        - Continue present medications.                        - Await pathology results.                        - Repeat colonoscopy post-operatively (~39months) with                         advanced endoscopy.                        - Could also consider performing colonoscopy with                         advanced endoscopy for ascending colon polyp removal                         prior to partial colectomy for mass                        -  Refer to a surgeon at appointment to be scheduled.                        - The findings and recommendations were discussed with                         the patient.                        - The findings and recommendations were discussed with                         the patient's family.                        - The findings and recommendations were discussed with  the referring physician. Procedure Code(s):     --- Professional ---                        [phone], Colonoscopy, flexible; with removal of                         tumor(s), polyp(s), or other lesion(s) by snare                         technique                        45380, 59, Colonoscopy, flexible; with biopsy, single                         or multiple Diagnosis Code(s):     --- Professional ---                        K64.0, First degree hemorrhoids                        D12.3, Benign neoplasm of transverse colon (hepatic                         flexure or splenic flexure)                        D12.2, Benign neoplasm of ascending colon                        D12.5, Benign neoplasm of sigmoid colon                        K92.1, Melena (includes Hematochezia)                        K57.30, Diverticulosis of large intestine without                         perforation or abscess without bleeding CPT copyright 2022 American Medical Association. All rights reserved. The codes documented in this report are preliminary and upon coder review may  be revised to meet current compliance requirements. Attending Participation:      I personally performed the entire procedure. Elfredia Nevins, DO [name] DO, DO 08/21/2022 1:29:40 PM This report has been signed electronically. Number of Addenda: 0 Note Initiated On: 08/21/2022 11:39 AM Scope Withdrawal Time: 0 hours 26 minutes 50 seconds  Total Procedure Duration: 0 hours 39 minutes 4 seconds  Estimated Blood Loss:  Estimated blood loss was minimal.       [place]

## 2022-08-21 NOTE — Interval H&P Note (Signed)
History and Physical Interval Note: Progress note from August 20, 2022 was reviewed and there was no interval change after seeing and examining the patient.  He was able to complete 2 gallons of GoLytely prep with clear bowel movements per nursing.  No further hematochezia.  Hemoglobin stable at 7.5 vital signs stable as well.  Blood glucose was lower this morning at 42 and he received apple juice and improved appropriately.  Written consent was obtained from the patient after discussion of risks, benefits, and alternatives. Patient has consented to proceed with Colonoscopy with possible intervention   08/21/2022 11:55 AM  Robert [name]  has presented today for surgery, with the diagnosis of hematochezia.  The various methods of treatment have been discussed with the patient and family. After consideration of risks, benefits and other options for treatment, the patient has consented to  Procedure(s): COLONOSCOPY WITH PROPOFOL (N/A) as a surgical intervention.  The patient's history has been reviewed, patient examined, no change in status, stable for surgery.  I have reviewed the patient's chart and labs.  Questions were answered to the patient's satisfaction.     [name]

## 2022-08-21 NOTE — Progress Notes (Signed)
Katie with the Lab called to report a critical blood sugar of 42. Rechecked CBG and it was 43. MD made aware. Gave standing order for amp D-50. CBG now 108.

## 2022-08-21 NOTE — Care Plan (Addendum)
Brief GI Post op not  See op report for further details  Several polyps were removed. Two large lesions, previously noted in 2019, were also present. The lesion in the right colon may still be amenable to advanced endoscopy intervention for removal, however, the sigmoid lesion is suspicious for malignancy. This was biopsied. It sits between two tattoos and is closer to the distal tattoo.  I discussed these findings with the patient and his wife (on the phone separately). Pt will need advanced endoscopy referral as outpatient in attempt to address right colon lesion.  If this can be removed then, he can have partial colectomy of sigmoid at [facility] (have discussed with surgeon). Should the right colon lesion not be able to be removed, would recommend colorectal surgery referral to address both lesions.  This has been d/w the patient and the hospitalist as well. CEA has been requested. Given significant bleeding previously, recommend holding plavix if possible  I will have our office follow up to set up advanced endoscopy referral.  GI to sign off. Available as needed. Please do not hesitate to call regarding questions or concerns.  Enis Slipper, DO [facility] Gastroenterology

## 2022-08-21 NOTE — Anesthesia Postprocedure Evaluation (Signed)
Anesthesia Post Note  Patient: MANSEL KIRGAN  Procedure(s) Performed: COLONOSCOPY WITH PROPOFOL POLYPECTOMY  Patient location during evaluation: Endoscopy Anesthesia Type: General Level of consciousness: awake and alert Pain management: pain level controlled Vital Signs Assessment: post-procedure vital signs reviewed and stable Respiratory status: spontaneous breathing, nonlabored ventilation, respiratory function stable and patient connected to nasal cannula oxygen Cardiovascular status: blood pressure returned to baseline and stable Postop Assessment: no apparent nausea or vomiting Anesthetic complications: no   No notable events documented.   Last Vitals:  Vitals:   08/21/22 0829 08/21/22 1317  BP: 131/87 115/71  Pulse: 87 79  Resp: 16 19  Temp: 36.9 C 36.7 C  SpO2: 100% 97%    Last Pain:  Vitals:   08/21/22 1327  TempSrc:   PainSc: 0-No pain                 Louie Boston
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
## 2022-08-21 NOTE — Anesthesia Preprocedure Evaluation (Signed)
Anesthesia Evaluation  Patient identified by MRN, date of birth, ID band Patient awake    Reviewed: Allergy & Precautions, NPO status , Patient's Chart, lab work & pertinent test results  History of Anesthesia Complications Negative for: history of anesthetic complications  Airway Mallampati: III  TM Distance: >3 FB Neck ROM: full    Dental  (+) Poor Dentition   Pulmonary neg pulmonary ROS   Pulmonary exam normal        Cardiovascular hypertension, On Medications + Peripheral Vascular Disease  Normal cardiovascular exam  EKG Sinus rhythm with occasional Premature ventricular complexes Minimal voltage criteria for LVH, may be normal variant ( R in aVL )  IMPRESSIONS     1. Left ventricular ejection fraction, by estimation, is 50 to 55%. The  left ventricle has low normal function. The left ventricle has no regional  wall motion abnormalities. Left ventricular diastolic parameters are  consistent with Grade I diastolic  dysfunction (impaired relaxation).   2. Right ventricular systolic function is normal. The right ventricular  size is normal.   3. Moderate to large pericardial effusion. The pericardial effusion is  circumferential. There is no evidence of cardiac tamponade.   4. The mitral valve is normal in structure. No evidence of mitral valve  regurgitation.   5. The aortic valve is tricuspid. Aortic valve regurgitation is not  visualized.   6. The inferior vena cava is dilated in size with <50% respiratory  variability, suggesting right atrial pressure of 15 mmHg.      Neuro/Psych negative neurological ROS  negative psych ROS   GI/Hepatic negative GI ROS, Neg liver ROS,,,  Endo/Other  diabetes  Morbid obesity  Renal/GU ARF and CRFRenal diseaseAdrenal mass  negative genitourinary   Musculoskeletal   Abdominal   Peds  Hematology  (+) Blood dyscrasia, anemia pancytopenia   Anesthesia Other  Findings Past Medical History: No date: Allergy No date: Cellulitis No date: Diabetes mellitus without complication (HCC) No date: Hyperlipidemia No date: Hypertension No date: Peripheral vascular disease (HCC)  Past Surgical History: 10/02/2017: COLONOSCOPY WITH PROPOFOL; N/A     Comment:  Procedure: COLONOSCOPY WITH PROPOFOL;  Surgeon: Wyline Mood, MD;  Location: [facility] ENDOSCOPY;  Service:               Gastroenterology;  Laterality: N/A; 08/11/2022: ESOPHAGOGASTRODUODENOSCOPY (EGD) WITH PROPOFOL; N/A     Comment:  Procedure: ESOPHAGOGASTRODUODENOSCOPY (EGD) WITH               PROPOFOL;  Surgeon: Midge Minium, MD;  Location: [facility]               ENDOSCOPY;  Service: Endoscopy;  Laterality: N/A; No date: HERNIA REPAIR  BMI    Body Mass Index: 42.18 kg/m      Reproductive/Obstetrics negative OB ROS                             Anesthesia Physical Anesthesia Plan  ASA: 3  Anesthesia Plan: General   Post-op Pain Management: Minimal or no pain anticipated   Induction: Intravenous  PONV Risk Score and Plan: Propofol infusion and TIVA  Airway Management Planned: Natural Airway and Nasal Cannula  Additional Equipment:   Intra-op Plan:   Post-operative Plan:   Informed Consent: I have reviewed the patients History and Physical, chart, labs and discussed the procedure including the risks, benefits and alternatives  for the proposed anesthesia with the patient or authorized representative who has indicated his/her understanding and acceptance.     Dental Advisory Given  Plan Discussed with: Anesthesiologist, CRNA and Surgeon  Anesthesia Plan Comments: (Patient consented for risks of anesthesia including but not limited to:  - adverse reactions to medications - risk of airway placement if required - damage to eyes, teeth, lips or other oral mucosa - nerve damage due to positioning  - sore throat or hoarseness - Damage to heart,  brain, nerves, lungs, other parts of body or loss of life  Patient voiced understanding.)        Anesthesia Quick Evaluation

## 2022-08-22 ENCOUNTER — Inpatient Hospital Stay: Payer: Medicare Other

## 2022-08-22 ENCOUNTER — Inpatient Hospital Stay (HOSPITAL_COMMUNITY)
Admit: 2022-08-22 | Discharge: 2022-08-22 | Disposition: A | Discharge status: Home / Self Care | Payer: Medicare Other | Attending: Internal Medicine | Admitting: Internal Medicine

## 2022-08-22 ENCOUNTER — Encounter: Payer: Self-pay | Admitting: Gastroenterology

## 2022-08-22 DIAGNOSIS — I10 Essential (primary) hypertension: Secondary | ICD-10-CM | POA: Diagnosis not present

## 2022-08-22 DIAGNOSIS — I3139 Other pericardial effusion (noninflammatory): Secondary | ICD-10-CM | POA: Diagnosis not present

## 2022-08-22 DIAGNOSIS — K921 Melena: Secondary | ICD-10-CM | POA: Diagnosis not present

## 2022-08-22 DIAGNOSIS — N1831 Chronic kidney disease, stage 3a: Secondary | ICD-10-CM | POA: Diagnosis not present

## 2022-08-22 LAB — ECHOCARDIOGRAM LIMITED
Calc EF: 52.3 %
Height: 74 in
S' Lateral: 4.1 cm
Single Plane A2C EF: 55 %
Single Plane A4C EF: 44.3 %
Weight: 5460.35 oz

## 2022-08-22 LAB — MAGNESIUM: Magnesium: 1.9 mg/dL (ref 1.7–2.4)

## 2022-08-22 LAB — HEMOGLOBIN AND HEMATOCRIT, BLOOD
HCT: 24 % — ABNORMAL LOW (ref 39.0–52.0)
Hemoglobin: 7.4 g/dL — ABNORMAL LOW (ref 13.0–17.0)

## 2022-08-22 LAB — BASIC METABOLIC PANEL
Anion gap: 9 (ref 5–15)
BUN: 16 mg/dL (ref 8–23)
CO2: 21 mmol/L — ABNORMAL LOW (ref 22–32)
Calcium: 8.2 mg/dL — ABNORMAL LOW (ref 8.9–10.3)
Chloride: 106 mmol/L (ref 98–111)
Creatinine, Ser: 1.43 mg/dL — ABNORMAL HIGH (ref 0.61–1.24)
GFR, Estimated: 50 mL/min — ABNORMAL LOW (ref >60)
Glucose, Bld: 164 mg/dL — ABNORMAL HIGH (ref 70–99)
Potassium: 4.7 mmol/L (ref 3.5–5.1)
Sodium: 136 mmol/L (ref 135–145)

## 2022-08-22 LAB — BRAIN NATRIURETIC PEPTIDE: B Natriuretic Peptide: 445.4 pg/mL — ABNORMAL HIGH (ref 0.0–100.0)

## 2022-08-22 LAB — CBC
HCT: 23.4 % — ABNORMAL LOW (ref 39.0–52.0)
Hemoglobin: 7.1 g/dL — ABNORMAL LOW (ref 13.0–17.0)
MCH: 26.5 pg (ref 26.0–34.0)
MCHC: 30.3 g/dL (ref 30.0–36.0)
MCV: 87.3 fL (ref 80.0–100.0)
Platelets: 143 10*3/uL — ABNORMAL LOW (ref 150–400)
RBC: 2.68 MIL/uL — ABNORMAL LOW (ref 4.22–5.81)
RDW: 17 % — ABNORMAL HIGH (ref 11.5–15.5)
WBC: 3.6 10*3/uL — ABNORMAL LOW (ref 4.0–10.5)
nRBC: 1.1 % — ABNORMAL HIGH (ref 0.0–0.2)

## 2022-08-22 LAB — GLUCOSE, CAPILLARY
Glucose-Capillary: 143 mg/dL — ABNORMAL HIGH (ref 70–99)
Glucose-Capillary: 147 mg/dL — ABNORMAL HIGH (ref 70–99)
Glucose-Capillary: 151 mg/dL — ABNORMAL HIGH (ref 70–99)
Glucose-Capillary: 148 mg/dL — ABNORMAL HIGH (ref 70–99)

## 2022-08-22 LAB — CEA: CEA: 2.6 ng/mL (ref 0.0–4.7)

## 2022-08-22 LAB — PROCALCITONIN: Procalcitonin: 1.51 ng/mL

## 2022-08-22 LAB — D-DIMER, QUANTITATIVE: D-Dimer, Quant: 15.2 ug/mL-FEU — ABNORMAL HIGH (ref 0.00–0.50)

## 2022-08-22 MED ORDER — METHOCARBAMOL 500 MG PO TABS
750.0000 mg | ORAL_TABLET | Freq: Three times a day (TID) | ORAL | Status: DC | PRN
  Administered 2022-08-22 – 2022-09-03 (×4): 750 mg via ORAL
  Filled 2022-08-22 (×4): qty 2

## 2022-08-22 MED ORDER — FUROSEMIDE 40 MG PO TABS: 40.0000 mg | ORAL_TABLET | Freq: Every day | ORAL | Status: DC

## 2022-08-22 MED ORDER — IPRATROPIUM-ALBUTEROL 0.5-2.5 (3) MG/3ML IN SOLN
3.0000 mL | Freq: Four times a day (QID) | RESPIRATORY_TRACT | Status: DC | PRN
  Administered 2022-08-24: 3 mL via RESPIRATORY_TRACT
  Filled 2022-08-22: qty 3

## 2022-08-22 MED ORDER — IOHEXOL 350 MG/ML SOLN
100.0000 mL | Freq: Once | INTRAVENOUS | Status: AC | PRN
  Administered 2022-08-22: 100 mL via INTRAVENOUS

## 2022-08-22 MED ORDER — STERILE WATER FOR INJECTION IJ SOLN
INTRAMUSCULAR | Status: AC
  Administered 2022-08-22: 10 mL via INTRAVENOUS
  Filled 2022-08-22: qty 10

## 2022-08-22 MED ORDER — FUROSEMIDE 10 MG/ML IJ SOLN
40.0000 mg | Freq: Once | INTRAMUSCULAR | Status: AC
  Administered 2022-08-22: 40 mg via INTRAVENOUS
  Filled 2022-08-22: qty 4

## 2022-08-22 MED ORDER — TRAMADOL HCL 50 MG PO TABS
50.0000 mg | ORAL_TABLET | Freq: Four times a day (QID) | ORAL | Status: DC | PRN
  Administered 2022-08-29 – 2022-09-04 (×4): 50 mg via ORAL
  Filled 2022-08-22 (×4): qty 1

## 2022-08-22 NOTE — Progress Notes (Signed)
OT Cancellation Note  Patient Details Name: Robert [name] MRN: [id number] DOB: [birth year]   Cancelled Treatment:    Reason Eval/Treat Not Completed: Other (comment). Consult received, chart reviewed. RN in room upon arrival. Per RN, pt unable to participate in therapy today due to SOB. Will re-attempt next date as medically appropriate.   Arman Filter., MPH, MS, OTR/L ascom [phone] 08/22/22, 2:29 PM

## 2022-08-22 NOTE — Progress Notes (Signed)
Per Dr Denton Lank, dc tele monitoring

## 2022-08-22 NOTE — Progress Notes (Signed)
Patient transported to CT at ~2035. Returned to room at ~2055. No needs voiced at this time.

## 2022-08-22 NOTE — Progress Notes (Signed)
PT Cancellation Note  Patient Details Name: Robert [name] MRN: [id number] DOB: 11/24/1945   Cancelled Treatment:    Reason Eval/Treat Not Completed: Other (comment). Consult received, chart reviewed. RN in room upon arrival. Per RN, pt unable to participate in therapy today due to SOB. Will re-attempt next date as medically appropriate.    Olga Coaster PT, DPT 2:41 PM,08/22/22
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
## 2022-08-22 NOTE — Progress Notes (Signed)
Earlier today patient was c/o back pain and SOB, O2 sats were WNL. Made MD aware and CXR ordered.  Later after lunch the patient stood up at bedside and his O2 sats dropped to low 80s. Tech called and reported this to me. Placed patient on 2 LPM via [place] with minimal results, then turned up to 3LPM with sats only being 88-89%, finally placed patient on 4LPM and sats were 90-93%. Patient stated that he felt like he couldn't catch his breath and had labored breathing. Paged MD. VSS. New orders were placed for patient.

## 2022-08-22 NOTE — Progress Notes (Signed)
*  PRELIMINARY RESULTS* Echocardiogram 2D Echocardiogram has been performed.  Cristela Blue 08/22/2022, 2:54 PM

## 2022-08-23 DIAGNOSIS — D61818 Other pancytopenia: Secondary | ICD-10-CM | POA: Diagnosis not present

## 2022-08-23 DIAGNOSIS — K921 Melena: Secondary | ICD-10-CM | POA: Diagnosis not present

## 2022-08-23 DIAGNOSIS — I3139 Other pericardial effusion (noninflammatory): Secondary | ICD-10-CM | POA: Diagnosis not present

## 2022-08-23 DIAGNOSIS — I5033 Acute on chronic diastolic (congestive) heart failure: Secondary | ICD-10-CM | POA: Diagnosis not present

## 2022-08-23 DIAGNOSIS — N189 Chronic kidney disease, unspecified: Secondary | ICD-10-CM

## 2022-08-23 DIAGNOSIS — J9601 Acute respiratory failure with hypoxia: Secondary | ICD-10-CM | POA: Diagnosis not present

## 2022-08-23 DIAGNOSIS — D649 Anemia, unspecified: Secondary | ICD-10-CM | POA: Diagnosis not present

## 2022-08-23 DIAGNOSIS — K922 Gastrointestinal hemorrhage, unspecified: Secondary | ICD-10-CM | POA: Diagnosis not present

## 2022-08-23 LAB — URINALYSIS, ROUTINE W REFLEX MICROSCOPIC
Bilirubin Urine: NEGATIVE
Glucose, UA: NEGATIVE mg/dL
Ketones, ur: NEGATIVE mg/dL
Nitrite: NEGATIVE
Protein, ur: NEGATIVE mg/dL
Specific Gravity, Urine: 1.012 (ref 1.005–1.030)
pH: 5 (ref 5.0–8.0)

## 2022-08-23 LAB — CBC
HCT: 25.3 % — ABNORMAL LOW (ref 39.0–52.0)
Hemoglobin: 7.5 g/dL — ABNORMAL LOW (ref 13.0–17.0)
MCH: 26.8 pg (ref 26.0–34.0)
MCHC: 29.6 g/dL — ABNORMAL LOW (ref 30.0–36.0)
MCV: 90.4 fL (ref 80.0–100.0)
Platelets: 142 10*3/uL — ABNORMAL LOW (ref 150–400)
RBC: 2.8 MIL/uL — ABNORMAL LOW (ref 4.22–5.81)
RDW: 17 % — ABNORMAL HIGH (ref 11.5–15.5)
WBC: 3.9 10*3/uL — ABNORMAL LOW (ref 4.0–10.5)
nRBC: 1.8 % — ABNORMAL HIGH (ref 0.0–0.2)

## 2022-08-23 LAB — GLUCOSE, CAPILLARY
Glucose-Capillary: 112 mg/dL — ABNORMAL HIGH (ref 70–99)
Glucose-Capillary: 152 mg/dL — ABNORMAL HIGH (ref 70–99)
Glucose-Capillary: 129 mg/dL — ABNORMAL HIGH (ref 70–99)
Glucose-Capillary: 136 mg/dL — ABNORMAL HIGH (ref 70–99)
Glucose-Capillary: 139 mg/dL — ABNORMAL HIGH (ref 70–99)

## 2022-08-23 LAB — BASIC METABOLIC PANEL
Anion gap: 9 (ref 5–15)
BUN: 16 mg/dL (ref 8–23)
CO2: 23 mmol/L (ref 22–32)
Calcium: 8.3 mg/dL — ABNORMAL LOW (ref 8.9–10.3)
Chloride: 105 mmol/L (ref 98–111)
Creatinine, Ser: 1.46 mg/dL — ABNORMAL HIGH (ref 0.61–1.24)
GFR, Estimated: 49 mL/min — ABNORMAL LOW (ref >60)
Glucose, Bld: 124 mg/dL — ABNORMAL HIGH (ref 70–99)
Potassium: 3.8 mmol/L (ref 3.5–5.1)
Sodium: 137 mmol/L (ref 135–145)

## 2022-08-23 LAB — ALDOSTERONE + RENIN ACTIVITY W/ RATIO
ALDO / PRA Ratio: 1 (ref 0.0–30.0)
Aldosterone: 7.2 ng/dL (ref 0.0–30.0)
PRA LC/MS/MS: 7.051 ng/mL/hr — ABNORMAL HIGH (ref 0.167–5.380)

## 2022-08-23 MED ORDER — FUROSEMIDE 10 MG/ML IJ SOLN
40.0000 mg | Freq: Two times a day (BID) | INTRAMUSCULAR | Status: DC
  Administered 2022-08-23 – 2022-08-24 (×3): 40 mg via INTRAVENOUS
  Filled 2022-08-23 (×3): qty 4

## 2022-08-23 MED ORDER — STERILE WATER FOR INJECTION IJ SOLN
INTRAMUSCULAR | Status: AC
  Administered 2022-08-23: 10 mL
  Filled 2022-08-23: qty 10

## 2022-08-23 MED ORDER — SODIUM CHLORIDE 0.9 % IV SOLN
200.0000 mg | Freq: Once | INTRAVENOUS | Status: AC
  Administered 2022-08-23: 200 mg via INTRAVENOUS
  Filled 2022-08-23: qty 200
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
## 2022-08-23 NOTE — Assessment & Plan Note (Addendum)
Due to volume overload, dCHF decompensation. Pt was NPO and hypoglycemic prior to colonoscopy, got IV fluids.  7/2 developed dyspnea and requiring O2.  CTA chest 7/2 ruled out PE (U/S also neg for DVT). --mgmt of CHF as outlined, getting diuresis --maintain O2 sat > 90%, wean as tolerated
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
## 2022-08-23 NOTE — Care Plan (Signed)
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  The patient is fairly sleepy today so much of the conversation was with his spouse.  I have placed a consult to advance endoscopy at [facility] as an outpatient for evaluation and possible resection of ascending colon polyp.  If this is able to be performed we will pursue partial colectomy with general surgery here in [place] whom I have discussed his case with.  Should the polyp not be able to be removed or appears more advanced on better visualization will recommend colorectal surgery consult for resection of both areas.  CEA 2.6.  GI to sign off. Available as needed. Please do not hesitate to call regarding questions or concerns. My card was provided to the patient to help follow-up outpatient  Enis Slipper, DO [facility]

## 2022-08-23 NOTE — Consult Note (Signed)
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 size was normal in size. Right Atrium: Right atrial size was normal in size. Pericardium: Moderate to large pericardial effusion. The pericardial effusion is circumferential. There is no evidence of cardiac tamponade. Mitral Valve: The mitral valve is normal in structure. No evidence of mitral valve regurgitation. Tricuspid Valve: The tricuspid valve is normal in structure. Tricuspid valve regurgitation is not demonstrated. Aortic Valve: The aortic valve is tricuspid. Aortic valve regurgitation is not visualized. Aortic valve peak gradient measures 5.8 mmHg. Pulmonic Valve: The pulmonic valve was not well visualized. Pulmonic valve regurgitation is not visualized. Aorta: The aortic root is normal in size and structure. Venous: The inferior vena cava is dilated in size with less than 50% respiratory variability, suggesting right atrial pressure of 15 mmHg. IAS/Shunts: No atrial level shunt detected by color flow Doppler.  LEFT VENTRICLE PLAX 2D LVIDd:         5.10 cm      Diastology LVIDs:         4.00 cm      LV e' medial:    8.70 cm/s LV PW:         1.40 cm       LV E/e' medial:  9.2 LV IVS:        1.30 cm      LV e' lateral:   5.87 cm/s LVOT diam:     2.20 cm      LV E/e' lateral: 13.7 LV SV:         80 LV SV Index:   30 LVOT Area:     3.80 cm  LV Volumes (MOD) LV vol d, MOD A4C: 111.0 ml LV vol s, MOD A4C: 56.8 ml LV SV MOD A4C:     111.0 ml RIGHT VENTRICLE RV Basal diam:  3.00 cm RV S prime:     8.70 cm/s TAPSE (M-mode): 1.9 cm LEFT ATRIUM           Index        RIGHT ATRIUM           Index LA diam:      3.10 cm 1.16 cm/m   RA Area:     22.80 cm LA Vol (A4C): 74.9 ml 27.91 ml/m  RA Volume:   67.60 ml  25.19 ml/m  AORTIC VALVE                 PULMONIC VALVE AV Area (Vmax): 3.31 cm     PV Vmax:        0.83 m/s AV Vmax:        120.00 cm/s  PV Peak  grad:   2.8 mmHg AV Peak Grad:   5.8 mmHg     RVOT Peak grad: 3 mmHg LVOT Vmax:      104.50 cm/s LVOT Vmean:     72.200 cm/s LVOT VTI:       0.210 m  AORTA Ao Root diam: 3.60 cm MITRAL VALVE MV Area (PHT): 3.81 cm    SHUNTS MV Decel Time: 199 msec    Systemic VTI:  0.21 m MV E velocity: 80.20 cm/s  Systemic Diam: 2.20 cm MV A velocity: 87.00 cm/s MV E/A ratio:  0.92 Debbe Odea MD Electronically signed by Debbe Odea MD Signature Date/Time: 08/20/2022/1:28:10 PM    Final      Assessment and Plan:   GI bleed/acute blood loss anemia/symptomatic anemia -Ongoing issue since prior admission -Previous colonoscopy completed which revealed a sigmoid polyp concerning for malignancy -7/1 had biopsy pending -Hemoglobin 7.5 recommend transfusing to keep greater then 8 -IV iron infusion yesterday -GI has been consulted -Clopidogrel remains on hold -Outpatient colorectal surgery doing  Pericardial effusion without tamponade -Noted as an incidental finding on CT of the chest -Slight increase noted on repeat limited echocardiogram -Recommend to continue with the diuretics of furosemide 40 mg IV twice daily -Can consider repeating limited echo at the beginning of next week to reevaluate effusion after  diureses  Acute on chronic diastolic congestive heart failure -Continues with some shortness of breath -Likely multifactorial with anemia, being volume up, and deconditioning - -1 L output in the last 24 hours -Continued on furosemide -Continued on Toprol-XL 100 mg daily -Heart failure education -Daily weights, I's and O's, low-sodium diet  AKI on CKD -Serum creatinine 1.46 -Baseline serum creatinine 1.2-1.5 -Daily BMP -Monitor urine output -Monitor/trend/replete electrolytes as needed -Avoid nephrotoxic agents were able  Acute respiratory failure with hypoxia -Oxygen saturations being maintained on 3 L of O2 via nasal cannula -Would likely benefit from blood transfusion -Continued management per IM  Essential hypertension -Blood pressure 144/72 -Continued on clonidine 0.3 mg daily, furosemide, Toprol-XL -Vital signs per unit protocol  Hyperlipidemia -Continued on simvastatin  Type 2 diabetes -Continued on insulin therapy -Continue management per IM  Risk Assessment/Risk Scores:        New York Heart Association (NYHA) Functional Class NYHA Class III        For questions or updates, please contact [facility] Please consult [email or web address] for contact info under    Signed, Verona Hartshorn, NP  08/23/2022 7:58 AM

## 2022-08-23 NOTE — Assessment & Plan Note (Addendum)
Echo 08/19/22: EF 50-55%, grade I DD, mod-large pericardial effusion. Pt developed dysnpea on IV fluids while NPO for c-scope.  Started diuresis on afternoon of 7/2. --Consult cardiology for this & pericardial effusion --Lasix 40 mg IV BID --Strict I/O's, Daily weights --Monitor renal function & electrolytes
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
## 2022-08-24 DIAGNOSIS — I3139 Other pericardial effusion (noninflammatory): Secondary | ICD-10-CM | POA: Diagnosis not present

## 2022-08-24 DIAGNOSIS — D649 Anemia, unspecified: Secondary | ICD-10-CM | POA: Diagnosis not present

## 2022-08-24 DIAGNOSIS — K921 Melena: Secondary | ICD-10-CM | POA: Diagnosis not present

## 2022-08-24 DIAGNOSIS — K922 Gastrointestinal hemorrhage, unspecified: Secondary | ICD-10-CM | POA: Diagnosis not present

## 2022-08-24 DIAGNOSIS — J9601 Acute respiratory failure with hypoxia: Secondary | ICD-10-CM | POA: Diagnosis not present

## 2022-08-24 LAB — BPAM RBC: ISSUE DATE / TIME: 202407040936

## 2022-08-24 LAB — BASIC METABOLIC PANEL
Anion gap: 11 (ref 5–15)
BUN: 20 mg/dL (ref 8–23)
CO2: 24 mmol/L (ref 22–32)
Calcium: 8.1 mg/dL — ABNORMAL LOW (ref 8.9–10.3)
Chloride: 100 mmol/L (ref 98–111)
Creatinine, Ser: 1.51 mg/dL — ABNORMAL HIGH (ref 0.61–1.24)
GFR, Estimated: 47 mL/min — ABNORMAL LOW (ref >60)
Glucose, Bld: 112 mg/dL — ABNORMAL HIGH (ref 70–99)
Potassium: 3.4 mmol/L — ABNORMAL LOW (ref 3.5–5.1)
Sodium: 135 mmol/L (ref 135–145)

## 2022-08-24 LAB — GLUCOSE, CAPILLARY
Glucose-Capillary: 109 mg/dL — ABNORMAL HIGH (ref 70–99)
Glucose-Capillary: 151 mg/dL — ABNORMAL HIGH (ref 70–99)
Glucose-Capillary: 129 mg/dL — ABNORMAL HIGH (ref 70–99)
Glucose-Capillary: 139 mg/dL — ABNORMAL HIGH (ref 70–99)

## 2022-08-24 LAB — CBC
HCT: 22.4 % — ABNORMAL LOW (ref 39.0–52.0)
Hemoglobin: 6.8 g/dL — ABNORMAL LOW (ref 13.0–17.0)
MCH: 26.6 pg (ref 26.0–34.0)
MCHC: 30.4 g/dL (ref 30.0–36.0)
MCV: 87.5 fL (ref 80.0–100.0)
Platelets: 145 10*3/uL — ABNORMAL LOW (ref 150–400)
RBC: 2.56 MIL/uL — ABNORMAL LOW (ref 4.22–5.81)
RDW: 16.8 % — ABNORMAL HIGH (ref 11.5–15.5)
WBC: 4 10*3/uL (ref 4.0–10.5)
nRBC: 2 % — ABNORMAL HIGH (ref 0.0–0.2)

## 2022-08-24 LAB — TYPE AND SCREEN: ABO/RH(D): AB POS

## 2022-08-24 LAB — URIC ACID: Uric Acid, Serum: 8.1 mg/dL (ref 3.7–8.6)

## 2022-08-24 LAB — HEMOGLOBIN AND HEMATOCRIT, BLOOD
HCT: 26 % — ABNORMAL LOW (ref 39.0–52.0)
Hemoglobin: 8 g/dL — ABNORMAL LOW (ref 13.0–17.0)

## 2022-08-24 LAB — PREPARE RBC (CROSSMATCH)

## 2022-08-24 MED ORDER — GUAIFENESIN ER 600 MG PO TB12
600.0000 mg | ORAL_TABLET | Freq: Two times a day (BID) | ORAL | Status: DC
  Administered 2022-08-24 – 2022-09-12 (×36): 600 mg via ORAL
  Filled 2022-08-24 (×38): qty 1

## 2022-08-24 MED ORDER — SODIUM CHLORIDE 0.9% IV SOLUTION: Freq: Once | INTRAVENOUS | Status: AC

## 2022-08-24 MED ORDER — POTASSIUM CHLORIDE CRYS ER 20 MEQ PO TBCR: 20.0000 meq | EXTENDED_RELEASE_TABLET | Freq: Once | ORAL | Status: DC

## 2022-08-24 MED ORDER — HYDROCOD POLI-CHLORPHE POLI ER 10-8 MG/5ML PO SUER: 5.0000 mL | Freq: Two times a day (BID) | ORAL | Status: DC | PRN

## 2022-08-24 MED ORDER — FUROSEMIDE 10 MG/ML IJ SOLN
40.0000 mg | Freq: Every day | INTRAMUSCULAR | Status: DC
  Administered 2022-08-25 – 2022-08-27 (×3): 40 mg via INTRAVENOUS
  Filled 2022-08-24 (×3): qty 4

## 2022-08-24 MED ORDER — POTASSIUM CHLORIDE CRYS ER 20 MEQ PO TBCR
40.0000 meq | EXTENDED_RELEASE_TABLET | Freq: Once | ORAL | Status: AC
  Administered 2022-08-24: 40 meq via ORAL
  Filled 2022-08-24: qty 2

## 2022-08-24 NOTE — Care Management Important Message (Signed)
Important Message  Patient Details  Name: Robert [name] MRN: [id number] Date of Birth: April 05, 1945   Medicare Important Message Given:  Yes     Robert [name] 08/24/2022, 11:27 AM
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
## 2022-08-24 NOTE — TOC Progression Note (Signed)
Transition of Care [facility]) - Progression Note    Patient Details  Name: Robert [name] MRN: [id number] Date of Birth: November 08, 1945  [facility] CM/SW Contact  Allena Katz, LCSW Phone Number: 08/24/2022, 10:47 AM  Clinical Narrative:   CSW spoke with patient about SNF. Pt reports he wants to transfer to [facility] and would like CSW to talk with MD about this. MD notified. Pt said he takes care of his wife and wants [place] to discuss SNF with her but doesn't feel like this will be the route he goes but does want her to mainly make this decision. CSW will follow up with wife. Per patient he has a RW and BSC at home.          Expected Discharge Plan and Services                                               Social Determinants of Health (SDOH) Interventions SDOH Screenings   Food Insecurity: No Food Insecurity (08/19/2022)  Housing: Low Risk  (08/19/2022)  Transportation Needs: No Transportation Needs (08/19/2022)  Utilities: Not At Risk (08/19/2022)  Alcohol Screen: Low Risk  (10/29/2020)  Depression (PHQ2-9): Low Risk  (09/12/2021)  Financial Resource Strain: Low Risk  (10/29/2020)  Physical Activity: Inactive (10/29/2020)  Social Connections: Moderately Isolated (10/29/2020)  Stress: No Stress Concern Present (10/29/2020)  Tobacco Use: Low Risk  (08/22/2022)    Readmission Risk Interventions     No data to display

## 2022-08-25 DIAGNOSIS — R0602 Shortness of breath: Secondary | ICD-10-CM

## 2022-08-25 DIAGNOSIS — Z515 Encounter for palliative care: Secondary | ICD-10-CM

## 2022-08-25 DIAGNOSIS — K921 Melena: Secondary | ICD-10-CM | POA: Diagnosis not present

## 2022-08-25 DIAGNOSIS — N189 Chronic kidney disease, unspecified: Secondary | ICD-10-CM | POA: Diagnosis not present

## 2022-08-25 DIAGNOSIS — Z7189 Other specified counseling: Secondary | ICD-10-CM

## 2022-08-25 DIAGNOSIS — K922 Gastrointestinal hemorrhage, unspecified: Secondary | ICD-10-CM | POA: Diagnosis not present

## 2022-08-25 DIAGNOSIS — C187 Malignant neoplasm of sigmoid colon: Secondary | ICD-10-CM | POA: Diagnosis not present

## 2022-08-25 DIAGNOSIS — I3139 Other pericardial effusion (noninflammatory): Secondary | ICD-10-CM | POA: Diagnosis not present

## 2022-08-25 LAB — TYPE AND SCREEN: Antibody Screen: NEGATIVE

## 2022-08-25 LAB — CBC
HCT: 24.4 % — ABNORMAL LOW (ref 39.0–52.0)
Hemoglobin: 7.5 g/dL — ABNORMAL LOW (ref 13.0–17.0)
MCH: 26.5 pg (ref 26.0–34.0)
MCHC: 30.7 g/dL (ref 30.0–36.0)
MCV: 86.2 fL (ref 80.0–100.0)
Platelets: 145 10*3/uL — ABNORMAL LOW (ref 150–400)
RBC: 2.83 MIL/uL — ABNORMAL LOW (ref 4.22–5.81)
RDW: 16.3 % — ABNORMAL HIGH (ref 11.5–15.5)
WBC: 4.7 10*3/uL (ref 4.0–10.5)
nRBC: 1.1 % — ABNORMAL HIGH (ref 0.0–0.2)

## 2022-08-25 LAB — BASIC METABOLIC PANEL
Anion gap: 12 (ref 5–15)
BUN: 25 mg/dL — ABNORMAL HIGH (ref 8–23)
CO2: 24 mmol/L (ref 22–32)
Calcium: 8.3 mg/dL — ABNORMAL LOW (ref 8.9–10.3)
Chloride: 100 mmol/L (ref 98–111)
Creatinine, Ser: 1.49 mg/dL — ABNORMAL HIGH (ref 0.61–1.24)
GFR, Estimated: 48 mL/min — ABNORMAL LOW (ref >60)
Glucose, Bld: 105 mg/dL — ABNORMAL HIGH (ref 70–99)
Potassium: 3.6 mmol/L (ref 3.5–5.1)
Sodium: 136 mmol/L (ref 135–145)

## 2022-08-25 LAB — GLUCOSE, CAPILLARY
Glucose-Capillary: 144 mg/dL — ABNORMAL HIGH (ref 70–99)
Glucose-Capillary: 111 mg/dL — ABNORMAL HIGH (ref 70–99)
Glucose-Capillary: 150 mg/dL — ABNORMAL HIGH (ref 70–99)

## 2022-08-25 LAB — HEMOGLOBIN AND HEMATOCRIT, BLOOD
HCT: 26.1 % — ABNORMAL LOW (ref 39.0–52.0)
Hemoglobin: 8.2 g/dL — ABNORMAL LOW (ref 13.0–17.0)

## 2022-08-25 LAB — PREPARE RBC (CROSSMATCH)

## 2022-08-25 LAB — PHOSPHORUS: Phosphorus: 4.1 mg/dL (ref 2.5–4.6)

## 2022-08-25 LAB — MAGNESIUM: Magnesium: 1.8 mg/dL (ref 1.7–2.4)

## 2022-08-25 LAB — METANEPHRINES, PLASMA
Metanephrine, Free: <25 pg/mL (ref 0.0–88.0)
Normetanephrine, Free: 184.8 pg/mL (ref 0.0–285.2)

## 2022-08-25 LAB — BPAM RBC: Blood Product Expiration Date: 202407162359

## 2022-08-25 MED ORDER — FLUTICASONE PROPIONATE 50 MCG/ACT NA SUSP
2.0000 | Freq: Two times a day (BID) | NASAL | Status: DC | PRN
  Filled 2022-08-25: qty 16

## 2022-08-25 MED ORDER — SODIUM CHLORIDE 0.9% FLUSH
10.0000 mL | Freq: Two times a day (BID) | INTRAVENOUS | Status: DC
  Administered 2022-08-25 – 2022-08-31 (×12): 10 mL

## 2022-08-25 MED ORDER — SODIUM CHLORIDE 0.9% FLUSH: 10.0000 mL | INTRAVENOUS | Status: DC | PRN

## 2022-08-25 MED ORDER — SODIUM CHLORIDE 0.9% IV SOLUTION: Freq: Once | INTRAVENOUS | Status: DC

## 2022-08-25 NOTE — Progress Notes (Signed)
Pt continues to tolerate blood transfusion without difficulty

## 2022-08-25 NOTE — Plan of Care (Signed)
  Problem: Education: Goal: Ability to describe self-care measures that may prevent or decrease complications (Diabetes Survival Skills Education) will improve Outcome: Progressing   Problem: Coping: Goal: Ability to adjust to condition or change in health will improve Outcome: Progressing   Problem: Fluid Volume: Goal: Ability to maintain a balanced intake and output will improve Outcome: Progressing   Problem: Health Behavior/Discharge Planning: Goal: Ability to manage health-related needs will improve Outcome: Progressing   Problem: Skin Integrity: Goal: Risk for impaired skin integrity will decrease Outcome: Progressing   Problem: Tissue Perfusion: Goal: Adequacy of tissue perfusion will improve Outcome: Progressing   Problem: Education: Goal: Knowledge of General Education information will improve Description: Including pain rating scale, medication(s)/side effects and non-pharmacologic comfort measures Outcome: Progressing   Problem: Clinical Measurements: Goal: Ability to maintain clinical measurements within normal limits will improve Outcome: Progressing   Problem: Activity: Goal: Risk for activity intolerance will decrease Outcome: Progressing   Problem: Nutrition: Goal: Adequate nutrition will be maintained Outcome: Progressing   Problem: Pain Managment: Goal: General experience of comfort will improve Outcome: Progressing   Problem: Safety: Goal: Ability to remain free from injury will improve Outcome: Progressing   Problem: Skin Integrity: Goal: Risk for impaired skin integrity will decrease Outcome: Progressing
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
## 2022-08-25 NOTE — Progress Notes (Signed)
1 unit PRBCs infusing without difficulty. No signs or symptoms of transfusion reaction noted.

## 2022-08-26 DIAGNOSIS — I3139 Other pericardial effusion (noninflammatory): Secondary | ICD-10-CM | POA: Diagnosis not present

## 2022-08-26 DIAGNOSIS — J9601 Acute respiratory failure with hypoxia: Secondary | ICD-10-CM | POA: Diagnosis not present

## 2022-08-26 DIAGNOSIS — K922 Gastrointestinal hemorrhage, unspecified: Secondary | ICD-10-CM | POA: Diagnosis not present

## 2022-08-26 DIAGNOSIS — C187 Malignant neoplasm of sigmoid colon: Secondary | ICD-10-CM | POA: Diagnosis not present

## 2022-08-26 DIAGNOSIS — K921 Melena: Secondary | ICD-10-CM | POA: Diagnosis not present

## 2022-08-26 LAB — RETICULOCYTES
Immature Retic Fract: 20.2 % — ABNORMAL HIGH (ref 2.3–15.9)
RBC.: 2.97 MIL/uL — ABNORMAL LOW (ref 4.22–5.81)
Retic Count, Absolute: 112.6 10*3/uL (ref 19.0–186.0)
Retic Ct Pct: 3.8 % — ABNORMAL HIGH (ref 0.4–3.1)

## 2022-08-26 LAB — GLUCOSE, CAPILLARY
Glucose-Capillary: 101 mg/dL — ABNORMAL HIGH (ref 70–99)
Glucose-Capillary: 147 mg/dL — ABNORMAL HIGH (ref 70–99)
Glucose-Capillary: 109 mg/dL — ABNORMAL HIGH (ref 70–99)
Glucose-Capillary: 123 mg/dL — ABNORMAL HIGH (ref 70–99)

## 2022-08-26 LAB — CBC
HCT: 25.5 % — ABNORMAL LOW (ref 39.0–52.0)
Hemoglobin: 8.1 g/dL — ABNORMAL LOW (ref 13.0–17.0)
MCH: 27.3 pg (ref 26.0–34.0)
MCHC: 31.8 g/dL (ref 30.0–36.0)
MCV: 85.9 fL (ref 80.0–100.0)
Platelets: 138 10*3/uL — ABNORMAL LOW (ref 150–400)
RBC: 2.97 MIL/uL — ABNORMAL LOW (ref 4.22–5.81)
RDW: 16.9 % — ABNORMAL HIGH (ref 11.5–15.5)
WBC: 4 10*3/uL (ref 4.0–10.5)
nRBC: 0.8 % — ABNORMAL HIGH (ref 0.0–0.2)

## 2022-08-26 LAB — BASIC METABOLIC PANEL
Anion gap: 10 (ref 5–15)
BUN: 25 mg/dL — ABNORMAL HIGH (ref 8–23)
CO2: 26 mmol/L (ref 22–32)
Calcium: 8.5 mg/dL — ABNORMAL LOW (ref 8.9–10.3)
Chloride: 101 mmol/L (ref 98–111)
Creatinine, Ser: 1.48 mg/dL — ABNORMAL HIGH (ref 0.61–1.24)
GFR, Estimated: 48 mL/min — ABNORMAL LOW (ref >60)
Glucose, Bld: 100 mg/dL — ABNORMAL HIGH (ref 70–99)
Potassium: 3.5 mmol/L (ref 3.5–5.1)
Sodium: 137 mmol/L (ref 135–145)

## 2022-08-26 LAB — HEMOGLOBIN AND HEMATOCRIT, BLOOD
HCT: 27.4 % — ABNORMAL LOW (ref 39.0–52.0)
Hemoglobin: 8.4 g/dL — ABNORMAL LOW (ref 13.0–17.0)

## 2022-08-26 LAB — TYPE AND SCREEN
Unit division: 0
Unit division: 0

## 2022-08-26 LAB — MAGNESIUM: Magnesium: 1.9 mg/dL (ref 1.7–2.4)

## 2022-08-26 LAB — PHOSPHORUS: Phosphorus: 4 mg/dL (ref 2.5–4.6)

## 2022-08-26 LAB — BPAM RBC: Unit Type and Rh: 6200

## 2022-08-26 LAB — LACTATE DEHYDROGENASE: LDH: 220 U/L — ABNORMAL HIGH (ref 98–192)

## 2022-08-26 MED ORDER — POTASSIUM CHLORIDE CRYS ER 20 MEQ PO TBCR
40.0000 meq | EXTENDED_RELEASE_TABLET | Freq: Once | ORAL | Status: DC
  Filled 2022-08-26: qty 2

## 2022-08-26 MED ORDER — POTASSIUM CHLORIDE 20 MEQ PO PACK
40.0000 meq | PACK | Freq: Once | ORAL | Status: AC
  Administered 2022-08-26: 40 meq
  Filled 2022-08-26: qty 2

## 2022-08-26 MED ORDER — BISACODYL 5 MG PO TBEC
10.0000 mg | DELAYED_RELEASE_TABLET | Freq: Every day | ORAL | Status: DC
  Administered 2022-08-26: 10 mg via ORAL
  Filled 2022-08-26: qty 2

## 2022-08-26 MED ORDER — POLYETHYLENE GLYCOL 3350 17 G PO PACK
17.0000 g | PACK | Freq: Two times a day (BID) | ORAL | Status: DC
  Administered 2022-08-26 – 2022-09-12 (×20): 17 g via ORAL
  Filled 2022-08-26 (×29): qty 1

## 2022-08-26 MED ORDER — BISACODYL 10 MG RE SUPP: 10.0000 mg | Freq: Every day | RECTAL | Status: DC | PRN

## 2022-08-26 MED ORDER — BISACODYL 10 MG RE SUPP: 10.0000 mg | Freq: Every day | RECTAL | Status: DC
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
## 2022-08-27 DIAGNOSIS — K921 Melena: Secondary | ICD-10-CM | POA: Diagnosis not present

## 2022-08-27 DIAGNOSIS — N189 Chronic kidney disease, unspecified: Secondary | ICD-10-CM | POA: Diagnosis not present

## 2022-08-27 DIAGNOSIS — I3139 Other pericardial effusion (noninflammatory): Secondary | ICD-10-CM | POA: Diagnosis not present

## 2022-08-27 LAB — BPAM RBC
ISSUE DATE / TIME: 202407071137
Unit Type and Rh: 5100

## 2022-08-27 LAB — TYPE AND SCREEN
ABO/RH(D): AB POS
Antibody Screen: NEGATIVE
Unit division: 0

## 2022-08-27 LAB — CBC
HCT: 25.8 % — ABNORMAL LOW (ref 39.0–52.0)
Hemoglobin: 7.9 g/dL — ABNORMAL LOW (ref 13.0–17.0)
MCH: 26.8 pg (ref 26.0–34.0)
MCHC: 30.6 g/dL (ref 30.0–36.0)
MCV: 87.5 fL (ref 80.0–100.0)
Platelets: 140 10*3/uL — ABNORMAL LOW (ref 150–400)
RBC: 2.95 MIL/uL — ABNORMAL LOW (ref 4.22–5.81)
RDW: 16.9 % — ABNORMAL HIGH (ref 11.5–15.5)
WBC: 3.9 10*3/uL — ABNORMAL LOW (ref 4.0–10.5)
nRBC: 0.8 % — ABNORMAL HIGH (ref 0.0–0.2)

## 2022-08-27 LAB — GLUCOSE, CAPILLARY
Glucose-Capillary: 91 mg/dL (ref 70–99)
Glucose-Capillary: 121 mg/dL — ABNORMAL HIGH (ref 70–99)
Glucose-Capillary: 82 mg/dL (ref 70–99)
Glucose-Capillary: 109 mg/dL — ABNORMAL HIGH (ref 70–99)

## 2022-08-27 LAB — CBC WITH DIFFERENTIAL/PLATELET
Abs Immature Granulocytes: 0.04 10*3/uL (ref 0.00–0.07)
Basophils Absolute: 0 10*3/uL (ref 0.0–0.1)
Basophils Relative: 0 %
Eosinophils Absolute: 0 10*3/uL (ref 0.0–0.5)
Eosinophils Relative: 0 %
HCT: 26 % — ABNORMAL LOW (ref 39.0–52.0)
Hemoglobin: 7.9 g/dL — ABNORMAL LOW (ref 13.0–17.0)
Immature Granulocytes: 1 %
Lymphocytes Relative: 58 %
Lymphs Abs: 2.3 10*3/uL (ref 0.7–4.0)
MCH: 26.6 pg (ref 26.0–34.0)
MCHC: 30.4 g/dL (ref 30.0–36.0)
MCV: 87.5 fL (ref 80.0–100.0)
Monocytes Absolute: 0.5 10*3/uL (ref 0.1–1.0)
Monocytes Relative: 11 %
Neutro Abs: 1.2 10*3/uL — ABNORMAL LOW (ref 1.7–7.7)
Neutrophils Relative %: 30 %
Platelets: 134 10*3/uL — ABNORMAL LOW (ref 150–400)
RBC: 2.97 MIL/uL — ABNORMAL LOW (ref 4.22–5.81)
RDW: 17.2 % — ABNORMAL HIGH (ref 11.5–15.5)
WBC: 4 10*3/uL (ref 4.0–10.5)
nRBC: 0.7 % — ABNORMAL HIGH (ref 0.0–0.2)

## 2022-08-27 LAB — BASIC METABOLIC PANEL
Anion gap: 11 (ref 5–15)
BUN: 24 mg/dL — ABNORMAL HIGH (ref 8–23)
CO2: 26 mmol/L (ref 22–32)
Calcium: 8.7 mg/dL — ABNORMAL LOW (ref 8.9–10.3)
Chloride: 100 mmol/L (ref 98–111)
Creatinine, Ser: 1.34 mg/dL — ABNORMAL HIGH (ref 0.61–1.24)
GFR, Estimated: 55 mL/min — ABNORMAL LOW (ref >60)
Glucose, Bld: 99 mg/dL (ref 70–99)
Potassium: 3.7 mmol/L (ref 3.5–5.1)
Sodium: 137 mmol/L (ref 135–145)

## 2022-08-27 LAB — PREPARE RBC (CROSSMATCH)

## 2022-08-27 LAB — PHOSPHORUS: Phosphorus: 3.7 mg/dL (ref 2.5–4.6)

## 2022-08-27 LAB — HAPTOGLOBIN: Haptoglobin: 420 mg/dL — ABNORMAL HIGH (ref 34–355)

## 2022-08-27 LAB — MAGNESIUM: Magnesium: 2 mg/dL (ref 1.7–2.4)

## 2022-08-27 MED ORDER — MAGNESIUM HYDROXIDE 400 MG/5ML PO SUSP
15.0000 mL | Freq: Once | ORAL | Status: AC
  Administered 2022-08-27: 15 mL via ORAL
  Filled 2022-08-27: qty 30

## 2022-08-27 MED ORDER — POTASSIUM CHLORIDE CRYS ER 20 MEQ PO TBCR
40.0000 meq | EXTENDED_RELEASE_TABLET | Freq: Every day | ORAL | Status: DC
  Administered 2022-08-27 – 2022-09-12 (×17): 40 meq via ORAL
  Filled 2022-08-27 (×17): qty 2

## 2022-08-27 MED ORDER — SODIUM CHLORIDE 0.9% IV SOLUTION: Freq: Once | INTRAVENOUS | Status: AC

## 2022-08-27 MED ORDER — BISACODYL 5 MG PO TBEC
10.0000 mg | DELAYED_RELEASE_TABLET | Freq: Two times a day (BID) | ORAL | Status: AC
  Administered 2022-08-27 – 2022-08-28 (×3): 10 mg via ORAL
  Filled 2022-08-27 (×3): qty 2

## 2022-08-27 NOTE — Progress Notes (Signed)
Mobility Specialist - Progress Note   08/27/22 1322  Mobility  Activity Contraindicated/medical hold   Pt receiving blood transfusion at time of attempt, will attempt again at another date/time.   Terrilyn Saver  Mobility Specialist  08/27/22 1:22 PM

## 2022-08-27 NOTE — Progress Notes (Signed)
Patient is alert and oriented X4. He got one pint blood.no any sign of blood transfusion reaction seen. Patient tolerate well
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
## 2022-08-27 NOTE — TOC Progression Note (Signed)
Transition of Care [facility]) - Progression Note    Patient Details  Name: Robert [name] MRN: [id number] Date of Birth: 1945/03/30  Transition of Care [facility]) CM/SW Contact  Chapman Fitch, RN Phone Number: 08/27/2022, 9:47 AM  Clinical Narrative:     Followed up with patient in regards to SNF Rec  Patient states he is aware that transfer to [facility] was denied.   Patient agreeable to bed search.  States he would like his wife to be present when bed offers presented        Expected Discharge Plan and Services                                               Social Determinants of Health (SDOH) Interventions SDOH Screenings   Food Insecurity: No Food Insecurity (08/19/2022)  Housing: Low Risk  (08/19/2022)  Transportation Needs: No Transportation Needs (08/19/2022)  Utilities: Not At Risk (08/19/2022)  Alcohol Screen: Low Risk  (10/29/2020)  Depression (PHQ2-9): Low Risk  (09/12/2021)  Financial Resource Strain: Low Risk  (10/29/2020)  Physical Activity: Inactive (10/29/2020)  Social Connections: Moderately Isolated (10/29/2020)  Stress: No Stress Concern Present (10/29/2020)  Tobacco Use: Low Risk  (08/22/2022)    Readmission Risk Interventions     No data to display

## 2022-08-28 ENCOUNTER — Inpatient Hospital Stay (HOSPITAL_COMMUNITY)
Admit: 2022-08-28 | Discharge: 2022-08-28 | Disposition: A | Discharge status: Home / Self Care | Payer: Medicare Other | Attending: Cardiology | Admitting: Cardiology

## 2022-08-28 ENCOUNTER — Inpatient Hospital Stay
Admit: 2022-08-28 | Discharge: 2022-08-28 | Disposition: A | Discharge status: Home / Self Care | Payer: Medicare Other | Attending: Cardiology | Admitting: Cardiology

## 2022-08-28 DIAGNOSIS — K921 Melena: Secondary | ICD-10-CM | POA: Diagnosis not present

## 2022-08-28 DIAGNOSIS — I3139 Other pericardial effusion (noninflammatory): Secondary | ICD-10-CM | POA: Diagnosis not present

## 2022-08-28 DIAGNOSIS — I5033 Acute on chronic diastolic (congestive) heart failure: Secondary | ICD-10-CM | POA: Diagnosis not present

## 2022-08-28 LAB — KAPPA/LAMBDA LIGHT CHAINS
Kappa free light chain: 60.4 mg/L — ABNORMAL HIGH (ref 3.3–19.4)
Kappa, lambda light chain ratio: 1.52 (ref 0.26–1.65)
Lambda free light chains: 39.7 mg/L — ABNORMAL HIGH (ref 5.7–26.3)

## 2022-08-28 LAB — TYPE AND SCREEN

## 2022-08-28 LAB — BPAM RBC
Blood Product Expiration Date: 202407162359
Blood Product Expiration Date: 202407182359
ISSUE DATE / TIME: 202407051459
Unit Type and Rh: 6200

## 2022-08-28 LAB — BASIC METABOLIC PANEL
Anion gap: 10 (ref 5–15)
BUN: 25 mg/dL — ABNORMAL HIGH (ref 8–23)
CO2: 26 mmol/L (ref 22–32)
Calcium: 8.5 mg/dL — ABNORMAL LOW (ref 8.9–10.3)
Chloride: 100 mmol/L (ref 98–111)
Creatinine, Ser: 1.55 mg/dL — ABNORMAL HIGH (ref 0.61–1.24)
GFR, Estimated: 46 mL/min — ABNORMAL LOW (ref >60)
Glucose, Bld: 92 mg/dL (ref 70–99)
Potassium: 4.4 mmol/L (ref 3.5–5.1)
Sodium: 136 mmol/L (ref 135–145)

## 2022-08-28 LAB — GLUCOSE, CAPILLARY
Glucose-Capillary: 96 mg/dL (ref 70–99)
Glucose-Capillary: 147 mg/dL — ABNORMAL HIGH (ref 70–99)
Glucose-Capillary: 86 mg/dL (ref 70–99)
Glucose-Capillary: 137 mg/dL — ABNORMAL HIGH (ref 70–99)

## 2022-08-28 LAB — TROPONIN I (HIGH SENSITIVITY)
Troponin I (High Sensitivity): 5 ng/L (ref <18)
Troponin I (High Sensitivity): 5 ng/L (ref <18)

## 2022-08-28 LAB — CBC
HCT: 26.9 % — ABNORMAL LOW (ref 39.0–52.0)
Hemoglobin: 8.3 g/dL — ABNORMAL LOW (ref 13.0–17.0)
MCH: 27.3 pg (ref 26.0–34.0)
MCHC: 30.9 g/dL (ref 30.0–36.0)
MCV: 88.5 fL (ref 80.0–100.0)
Platelets: 125 10*3/uL — ABNORMAL LOW (ref 150–400)
RBC: 3.04 MIL/uL — ABNORMAL LOW (ref 4.22–5.81)
RDW: 16.3 % — ABNORMAL HIGH (ref 11.5–15.5)
WBC: 3.6 10*3/uL — ABNORMAL LOW (ref 4.0–10.5)
nRBC: 0.8 % — ABNORMAL HIGH (ref 0.0–0.2)

## 2022-08-28 LAB — ECHOCARDIOGRAM LIMITED
Height: 74 in
S' Lateral: 3.8 cm
Weight: 5435.66 [oz_av]

## 2022-08-28 MED ORDER — FUROSEMIDE 10 MG/ML IJ SOLN
40.0000 mg | Freq: Two times a day (BID) | INTRAMUSCULAR | Status: DC
  Administered 2022-08-28 – 2022-08-29 (×4): 40 mg via INTRAVENOUS
  Filled 2022-08-28 (×4): qty 4

## 2022-08-28 MED ORDER — SODIUM CHLORIDE 0.9% FLUSH
3.0000 mL | Freq: Two times a day (BID) | INTRAVENOUS | Status: DC
  Administered 2022-08-28 – 2022-08-30 (×3): 3 mL via INTRAVENOUS

## 2022-08-28 MED ORDER — PANTOPRAZOLE SODIUM 40 MG PO TBEC
40.0000 mg | DELAYED_RELEASE_TABLET | Freq: Two times a day (BID) | ORAL | Status: DC
  Administered 2022-08-28 – 2022-09-12 (×31): 40 mg via ORAL
  Filled 2022-08-28 (×31): qty 1

## 2022-08-28 MED ORDER — DAPAGLIFLOZIN PROPANEDIOL 10 MG PO TABS
10.0000 mg | ORAL_TABLET | Freq: Every day | ORAL | Status: DC
  Administered 2022-08-28 – 2022-09-12 (×16): 10 mg via ORAL
  Filled 2022-08-28 (×16): qty 1

## 2022-08-28 MED ORDER — SORBITOL 70 % SOLN
960.0000 mL | TOPICAL_OIL | Freq: Once | ORAL | Status: DC
  Filled 2022-08-28: qty 240

## 2022-08-28 MED ORDER — TRAZODONE HCL 50 MG PO TABS
25.0000 mg | ORAL_TABLET | Freq: Every evening | ORAL | Status: DC | PRN
  Administered 2022-09-02 – 2022-09-11 (×5): 25 mg via ORAL
  Filled 2022-08-28 (×5): qty 1

## 2022-08-28 MED ORDER — HYDROCORTISONE 1 % EX LOTN
TOPICAL_LOTION | Freq: Two times a day (BID) | CUTANEOUS | Status: AC
  Filled 2022-08-28: qty 118

## 2022-08-28 MED ORDER — MELATONIN 5 MG PO TABS
5.0000 mg | ORAL_TABLET | Freq: Every day | ORAL | Status: AC
  Administered 2022-08-28 – 2022-09-01 (×5): 5 mg via ORAL
  Filled 2022-08-28 (×5): qty 1

## 2022-08-28 NOTE — Consult Note (Signed)
Advanced Heart Failure Team Consult Note   Primary Physician: Corky Downs, MD PCP-Cardiologist:  None  Reason for Consultation: CHF  HPI:    Robert [name] is seen today for evaluation of CHF at the request of Dr. Mariah Milling.   77 y.o. with history of HTN, DM2, CKD stage 3, PAD with recurrent cellulitis, and anemia.  Patient was initially admitted in 6/24 with GI bleeding.  He ultimately had an EGD that was unremarkable and a colonoscopy showing a 5 cm sigmoid mass as well as another concerning lesion in the ascending colon.  The sigmoid mass biopsy showed adenocarcinoma, the ascending colon lesion was not biopsied.  He was readmitted in 7/24 with ongoing lower GI bleeding and has required several units PRBCs.    CT chest incidentally showed a moderate pericardial effusion.  Echo in 6/24 showed moderate-large pericardial effusion without definite tamponade though IVC dilated, LV EF 50-55%, with normal RV.  Repeat echo on 08/22/22 showed EF 50-55%, normal RV, moderate-large pericardial effusion with equivocal tamponade physiology.  The location of the effusion was deemed difficult for pericardiocentesis.  Patient has been managed with IV lasix diuresis. Weight has been up and down without trend, unsure of accuracy.   Patient had a transfusion yesterday, hgb 8.3.  Currently getting once daily IV Lasix.  He denies dyspnea at rest.  He is awaiting definitive treatment of colon cancer.  Ideally was going to [facility] for advanced endoscopic and surgical treatment but no beds.   Review of Systems: All systems reviewed and negative except as per HPI.   Home Medications Prior to Admission medications   Medication Sig Start Date End Date Taking? Authorizing Provider  amLODipine (NORVASC) 10 MG tablet TAKE 1 TABLET BY MOUTH DAILY 05/30/21  Yes Masoud, Renda Rolls, MD  BD PEN NEEDLE NANO 2ND GEN 32G X 4 MM MISC USE 1 PEN NEEDLE EVERY DAY AS DIRECTED 01/21/21  Yes Masoud, Renda Rolls, MD  cloNIDine (CATAPRES) 0.3 MG  tablet TAKE 1 TABLET BY MOUTH DAILY 02/22/22  Yes Masoud, Renda Rolls, MD  clopidogrel (PLAVIX) 75 MG tablet TAKE 1 TABLET(75 MG) BY MOUTH DAILY 11/14/21  Yes Masoud, Renda Rolls, MD  DULoxetine (CYMBALTA) 60 MG capsule Take 1 capsule (60 mg total) by mouth 2 (two) times daily. 10/11/21  Yes Masoud, Renda Rolls, MD  furosemide (LASIX) 40 MG tablet TAKE 1 TABLET BY MOUTH EVERY DAY 06/24/19  Yes Masoud, Renda Rolls, MD  LANTUS SOLOSTAR 100 UNIT/ML Solostar Pen ADMINISTER 55 UNITS UNDER THE SKIN DAILY 12/19/21  Yes Masoud, Renda Rolls, MD  lisinopril (ZESTRIL) 10 MG tablet TAKE 1/2 TABLET BY MOUTH DAILY Patient taking differently: Take 5 mg by mouth daily. 11/09/21  Yes Masoud, Renda Rolls, MD  meclizine (ANTIVERT) 12.5 MG tablet TAKE 1 TABLET(12.5 MG) BY MOUTH TWICE DAILY AS NEEDED 01/30/22  Yes Masoud, Renda Rolls, MD  metoprolol succinate (TOPROL-XL) 100 MG 24 hr tablet TAKE 1 TABLET BY MOUTH EVERY 12 HOURS 05/06/21  Yes Kara Dies, NP  simvastatin (ZOCOR) 20 MG tablet TAKE 1 TABLET BY MOUTH DAILY 04/25/21  Yes Masoud, Renda Rolls, MD  tolterodine (DETROL LA) 4 MG 24 hr capsule TAKE 1 CAPSULE BY MOUTH DAILY 11/28/21  Yes Corky Downs, MD    Past Medical History: Past Medical History:  Diagnosis Date   Allergy    Cellulitis    Diabetes mellitus without complication (HCC)    Hyperlipidemia    Hypertension    Peripheral vascular disease (HCC)     Past Surgical History: Past Surgical History:  Procedure Laterality  Date   BIOPSY  08/21/2022   Procedure: BIOPSY;  Surgeon: [name], DO;  Location[facility] ENDOSCOPY;  Service: Gastroenterology;;   COLONOSCOPY WITH PROPOFOL N/A 10/02/2017   Procedure: COLONOSCOPY WITH PROPOFOL;  Surgeon: Wyline Mood, MD;  Location: [facility] ENDOSCOPY;  Service: Gastroenterology;  Laterality: N/A;   COLONOSCOPY WITH PROPOFOL N/A 08/21/2022   Procedure: COLONOSCOPY WITH PROPOFOL;  Surgeon: [name], DO;  Location: [facility] ENDOSCOPY;  Service: Gastroenterology;  Laterality: N/A;    ESOPHAGOGASTRODUODENOSCOPY (EGD) WITH PROPOFOL N/A 08/11/2022   Procedure: ESOPHAGOGASTRODUODENOSCOPY (EGD) WITH PROPOFOL;  Surgeon: Midge Minium, MD;  Location: [facility] ENDOSCOPY;  Service: Endoscopy;  Laterality: N/A;   HERNIA REPAIR     POLYPECTOMY  08/21/2022   Procedure: POLYPECTOMY;  Surgeon: [name], DO;  Location: [facility] ENDOSCOPY;  Service: Gastroenterology;;    Family History: Family History  Problem Relation Age of Onset   Heart disease Mother    Stroke Mother    Heart disease Father    Heart attack Father     Social History: Social History   Socioeconomic History   Marital status: Married    Spouse name: Not on file   Number of children: 0   Years of education: College Gradute   Highest education level: Bachelor's degree (e.g., BA, AB, BS)  Occupational History   Not on file  Tobacco Use   Smoking status: Never   Smokeless tobacco: Never  Vaping Use   Vaping Use: Never used  Substance and Sexual Activity   Alcohol use: Not Currently    Comment: Occasionally   Drug use: No   Sexual activity: Not Currently  Other Topics Concern   Not on file  Social History Narrative   Not on file   Social Determinants of Health   Financial Resource Strain: Low Risk  (10/29/2020)   Overall Financial Resource Strain (CARDIA)    Difficulty of Paying Living Expenses: Not hard at all  Food Insecurity: No Food Insecurity (08/19/2022)   Hunger Vital Sign    Worried About Running Out of Food in the Last Year: Never true    Ran Out of Food in the Last Year: Never true  Transportation Needs: No Transportation Needs (08/19/2022)   PRAPARE - Administrator, Civil Service (Medical): No    Lack of Transportation (Non-Medical): No  Physical Activity: Inactive (10/29/2020)   Exercise Vital Sign    Days of Exercise per Week: 0 days    Minutes of Exercise per Session: 0 min  Stress: No Stress Concern Present (10/29/2020)   [name] of Occupational Health -  Occupational Stress Questionnaire    Feeling of Stress : Not at all  Social Connections: Moderately Isolated (10/29/2020)   Social Connection and Isolation Panel [NHANES]    Frequency of Communication with Friends and Family: Once a week    Frequency of Social Gatherings with Friends and Family: Twice a week    Attends Religious Services: Never    Database administrator or Organizations: No    Attends Engineer, structural: Never    Marital Status: Married    Allergies:  No Known Allergies  Objective:    Vital Signs:   Temp:  [97 F (36.1 C)-98.3 F (36.8 C)] 98.1 F (36.7 C) (07/08 0535) Pulse Rate:  [81-91] 91 (07/08 0537) Resp:  [16-19] 18 (07/08 0535) BP: (105-124)/(65-95) 122/78 (07/08 0535) SpO2:  [90 %-100 %] 94 % (07/08 0537) Weight:  [154.1 kg] 154.1 kg (07/08 0442) Last  BM Date : 08/22/22 (per pt)  Weight change: Filed Weights   08/26/22 0500 08/27/22 0500 08/28/22 0442  Weight: (!) 153 kg (!) 153.5 kg (!) 154.1 kg    Intake/Output:   Intake/Output Summary (Last 24 hours) at 08/28/2022 9604 Last data filed at 08/27/2022 2000 Gross per 24 hour  Intake 320 ml  Output 1050 ml  Net -730 ml      Physical Exam    General:  Well appearing. No resp difficulty HEENT: normal Neck: Thick. JVP about 10 cm Carotids 2+ bilat; no bruits. No lymphadenopathy or thyromegaly appreciated. Cor: PMI nondisplaced. Regular rate & rhythm. No rubs, gallops or murmurs. Lungs: clear Abdomen: soft, nontender, nondistended. No hepatosplenomegaly. No bruits or masses. Good bowel sounds. Extremities: no cyanosis, clubbing, rash, edema Neuro: alert & orientedx3, cranial nerves grossly intact. moves all 4 extremities w/o difficulty. Affect pleasant   Telemetry   NSR (personally reviewed)  EKG    NSR with nonspecific T wave flattening (personally reviewed)  Labs   Basic Metabolic Panel: Recent Labs  Lab 08/22/22 0424 08/23/22 0506 08/24/22 0541 08/25/22 0503  08/26/22 0514 08/27/22 0632 08/28/22 0517  NA 136   < > 135 136 137 137 136  K 4.7   < > 3.4* 3.6 3.5 3.7 4.4  CL 106   < > 100 100 101 100 100  CO2 21*   < > 24 24 26 26 26   GLUCOSE 164*   < > 112* 105* 100* 99 92  BUN 16   < > 20 25* 25* 24* 25*  CREATININE 1.43*   < > 1.51* 1.49* 1.48* 1.34* 1.55*  CALCIUM 8.2*   < > 8.1* 8.3* 8.5* 8.7* 8.5*  MG 1.9  --   --  1.8 1.9 2.0  --   PHOS  --   --   --  4.1 4.0 3.7  --    < > = values in this interval not displayed.    Liver Function Tests: No results for input(s): "AST", "ALT", "ALKPHOS", "BILITOT", "PROT", "ALBUMIN" in the last 168 hours. No results for input(s): "LIPASE", "AMYLASE" in the last 168 hours. No results for input(s): "AMMONIA" in the last 168 hours.  CBC: Recent Labs  Lab 08/24/22 0541 08/24/22 1729 08/25/22 0503 08/25/22 2106 08/26/22 0514 08/26/22 1400 08/27/22 0632 08/28/22 0517  WBC 4.0  --  4.7  --  4.0  --  4.0  3.9* 3.6*  NEUTROABS  --   --   --   --   --   --  1.2*  --   HGB 6.8*   < > 7.5* 8.2* 8.1* 8.4* 7.9*  7.9* 8.3*  HCT 22.4*   < > 24.4* 26.1* 25.5* 27.4* 26.0*  25.8* 26.9*  MCV 87.5  --  86.2  --  85.9  --  87.5  87.5 88.5  PLT 145*  --  145*  --  138*  --  134*  140* 125*   < > = values in this interval not displayed.    Cardiac Enzymes: No results for input(s): "CKTOTAL", "CKMB", "CKMBINDEX", "TROPONINI" in the last 168 hours.  BNP: BNP (last 3 results) Recent Labs    08/22/22 1149  BNP 445.4*    ProBNP (last 3 results) No results for input(s): "PROBNP" in the last 8760 hours.   CBG: Recent Labs  Lab 08/27/22 0749 08/27/22 1133 08/27/22 1524 08/27/22 2053 08/28/22 0732  GLUCAP 91 121* 82 109* 96    Coagulation Studies: No  results for input(s): "LABPROT", "INR" in the last 72 hours.   Imaging   No results found.   Medications:     Current Medications:  bisacodyl  10 mg Oral BID   cloNIDine  0.3 mg Oral Daily   dapagliflozin propanediol  10 mg Oral Daily    docusate sodium  100 mg Oral BID   DULoxetine  60 mg Oral BID   fesoterodine  4 mg Oral Daily   furosemide  40 mg Intravenous BID   guaiFENesin  600 mg Oral BID   insulin aspart  0-15 Units Subcutaneous TID WC   insulin aspart  0-5 Units Subcutaneous QHS   insulin glargine-yfgn  25 Units Subcutaneous Daily   metoprolol succinate  100 mg Oral Daily   pantoprazole (PROTONIX) IV  40 mg Intravenous Q24H   polyethylene glycol  17 g Oral BID   potassium chloride  40 mEq Oral Daily   simvastatin  20 mg Oral q1800   sodium chloride flush  10-40 mL Intracatheter Q12H   sodium chloride flush  3 mL Intravenous Q12H    Infusions:    Assessment/Plan   1. GI bleeding: Lower GI bleed.  Hgb 8.3 today after transfusion yesterday. Colonoscopy showed a 5 cm sigmoid mass as well as another concerning lesion in the ascending colon.  The sigmoid mass biopsy showed adenocarcinoma, the ascending colon lesion was not biopsied.   - Ultimately, plan for advanced endoscopy at [facility] for removal of the ascending colon lesion and then partial resection of colon for sigmoid lesion.  No beds for transfer at this time though.  2. Acute on chronic diastolic CHF: In setting of possible early tamponade. Echo on 7/2 showed EF 50-55%, normal RV, equivocal tamponade physiology.  Have been treating with diuresis.  He is hemodynamically stable and continues to appear volume overloaded on exam.  Creatinine is stable, has been fluctuating 1.3-1.5.  - Would give Lasix 40 mg IV bid and follow creatinine closely.  - Add Farxiga 10 mg daily.  - Repeat limited echo to follow pericardial fluid.  - RHC tomorrow for formal assessment of filling pressures and to look for tamponade physiology . 3. Pericardial effusion: Incidentally found.  Currently with no chest pain.  Cause uncertain, ?viral pericarditis.  Difficult location for pericardiocentesis, have been managing with diuresis as he has been hemodynamically stable.  - As above,  repeat echo today.  - Would aim for RHC tomorrow.  4. CKD stage 3: Suspect due to DM and HTN.  Creatinine ranging 1.3-1.5, follow with diuresis.   5. PAD: Has been on statin and Plavix, Plavix currently on hold with GI bleeding and need for eventual surgery.   Marca Ancona 08/28/2022 8:26 AM    Length of Stay: 10  Marca Ancona, MD  08/28/2022, 8:07 AM  Advanced Heart Failure Team Pager [phone] (M-F; 7a - 5p)  Please contact [facility] for night-coverage after hours (4p -7a ) and weekends on [email or web address]
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
## 2022-08-28 NOTE — Progress Notes (Signed)
Patient ID: Robert [name], male   DOB: 1946-02-11, 77 y.o.   MRN: [id number]  I reviewed the repeat echo done today.  If anything, the pericardial effusion appears larger and the mitral valve respirophasic variation suggests hemodynamic significance (tamponade).  I reviewed with Dr. Kirke Corin, think that the best plan in this situation may be pericardiocentesis tomorrow rather than just RHC.  Will discuss with patient.   Marca Ancona 08/28/2022 5:59 PM
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
## 2022-08-28 NOTE — Progress Notes (Signed)
*  PRELIMINARY RESULTS* Echocardiogram A Limited 2D Echocardiogram has been performed.  Carolyne Fiscal 08/28/2022, 2:19 PM

## 2022-08-28 NOTE — Progress Notes (Signed)
OT Cancellation Note  Patient Details Name: ADNREW HONKALA MRN: [id number] DOB: November 08, 1945   Cancelled Treatment:    Reason Eval/Treat Not Completed: Patient at procedure or test/ unavailable Pt with staff in room for medical procedure, unavailable at this time. Will continue to follow POC at later date/time as pt available.   Maliyah Willets L. Trinitee Horgan, OTR/L  08/28/22, 1:49 PM

## 2022-08-29 ENCOUNTER — Other Ambulatory Visit (HOSPITAL_COMMUNITY): Payer: Self-pay

## 2022-08-29 ENCOUNTER — Inpatient Hospital Stay: Admit: 2022-08-29 | Payer: Medicare Other

## 2022-08-29 ENCOUNTER — Encounter
Admission: EM | Disposition: A | Discharge status: Skilled Nursing Facility | Payer: Self-pay | Source: Home / Self Care | Attending: Student

## 2022-08-29 ENCOUNTER — Inpatient Hospital Stay: Payer: Medicare Other

## 2022-08-29 DIAGNOSIS — I3139 Other pericardial effusion (noninflammatory): Secondary | ICD-10-CM | POA: Diagnosis not present

## 2022-08-29 DIAGNOSIS — K921 Melena: Secondary | ICD-10-CM | POA: Diagnosis not present

## 2022-08-29 DIAGNOSIS — I5040 Unspecified combined systolic (congestive) and diastolic (congestive) heart failure: Secondary | ICD-10-CM

## 2022-08-29 HISTORY — PX: PERICARDIOCENTESIS: CATH118255

## 2022-08-29 LAB — BASIC METABOLIC PANEL
Anion gap: 12 (ref 5–15)
BUN: 24 mg/dL — ABNORMAL HIGH (ref 8–23)
CO2: 28 mmol/L (ref 22–32)
Calcium: 8.7 mg/dL — ABNORMAL LOW (ref 8.9–10.3)
Chloride: 99 mmol/L (ref 98–111)
Creatinine, Ser: 1.52 mg/dL — ABNORMAL HIGH (ref 0.61–1.24)
GFR, Estimated: 47 mL/min — ABNORMAL LOW (ref >60)
Glucose, Bld: 101 mg/dL — ABNORMAL HIGH (ref 70–99)
Potassium: 4 mmol/L (ref 3.5–5.1)
Sodium: 139 mmol/L (ref 135–145)

## 2022-08-29 LAB — BODY FLUID CELL COUNT WITH DIFFERENTIAL
Eos, Fluid: 0 %
Lymphs, Fluid: 60 %
Monocyte-Macrophage-Serous Fluid: 10 %
Neutrophil Count, Fluid: 30 %
Total Nucleated Cell Count, Fluid: 1781 cu mm

## 2022-08-29 LAB — CBC
HCT: 27 % — ABNORMAL LOW (ref 39.0–52.0)
Hemoglobin: 8.2 g/dL — ABNORMAL LOW (ref 13.0–17.0)
MCH: 26.7 pg (ref 26.0–34.0)
MCHC: 30.4 g/dL (ref 30.0–36.0)
MCV: 87.9 fL (ref 80.0–100.0)
Platelets: 125 10*3/uL — ABNORMAL LOW (ref 150–400)
RBC: 3.07 MIL/uL — ABNORMAL LOW (ref 4.22–5.81)
RDW: 16.3 % — ABNORMAL HIGH (ref 11.5–15.5)
WBC: 4.2 10*3/uL (ref 4.0–10.5)
nRBC: 0 % (ref 0.0–0.2)

## 2022-08-29 LAB — ECHOCARDIOGRAM LIMITED
Height: 74 in
Weight: 5294.57 oz

## 2022-08-29 LAB — GLUCOSE, CAPILLARY
Glucose-Capillary: 93 mg/dL (ref 70–99)
Glucose-Capillary: 112 mg/dL — ABNORMAL HIGH (ref 70–99)
Glucose-Capillary: 94 mg/dL (ref 70–99)
Glucose-Capillary: 115 mg/dL — ABNORMAL HIGH (ref 70–99)

## 2022-08-29 LAB — MRSA NEXT GEN BY PCR, NASAL: MRSA by PCR Next Gen: NOT DETECTED

## 2022-08-29 SURGERY — PERICARDIOCENTESIS
Anesthesia: Moderate Sedation

## 2022-08-29 SURGERY — RIGHT HEART CATH
Anesthesia: Moderate Sedation

## 2022-08-29 MED ORDER — CHLORHEXIDINE GLUCONATE CLOTH 2 % EX PADS
6.0000 | MEDICATED_PAD | Freq: Every day | CUTANEOUS | Status: DC
  Administered 2022-08-29 – 2022-09-12 (×15): 6 via TOPICAL

## 2022-08-29 MED ORDER — SODIUM CHLORIDE 0.9% FLUSH
5.0000 mL | Freq: Three times a day (TID) | INTRAVENOUS | Status: DC
  Administered 2022-08-30 – 2022-08-31 (×3): 5 mL

## 2022-08-29 MED ORDER — MIDAZOLAM HCL 2 MG/2ML IJ SOLN
INTRAMUSCULAR | Status: AC
  Filled 2022-08-29: qty 2

## 2022-08-29 MED ORDER — MIDAZOLAM HCL 2 MG/2ML IJ SOLN
INTRAMUSCULAR | Status: DC | PRN
  Administered 2022-08-29: 1 mg via INTRAVENOUS

## 2022-08-29 MED ORDER — LIDOCAINE HCL (PF) 1 % IJ SOLN
INTRAMUSCULAR | Status: DC | PRN
  Administered 2022-08-29: 5 mL

## 2022-08-29 MED ORDER — LIDOCAINE HCL 1 % IJ SOLN
INTRAMUSCULAR | Status: AC
  Filled 2022-08-29: qty 20

## 2022-08-29 MED ORDER — FENTANYL CITRATE (PF) 100 MCG/2ML IJ SOLN
INTRAMUSCULAR | Status: DC | PRN
  Administered 2022-08-29: 25 ug via INTRAVENOUS

## 2022-08-29 MED ORDER — ASPIRIN 81 MG PO CHEW: 81.0000 mg | CHEWABLE_TABLET | ORAL | Status: DC

## 2022-08-29 MED ORDER — SODIUM CHLORIDE 0.9 % IV SOLN: INTRAVENOUS | Status: DC

## 2022-08-29 MED ORDER — FENTANYL CITRATE (PF) 100 MCG/2ML IJ SOLN
INTRAMUSCULAR | Status: AC
  Filled 2022-08-29: qty 2

## 2022-08-29 MED ORDER — HEPARIN (PORCINE) IN NACL 1000-0.9 UT/500ML-% IV SOLN
INTRAVENOUS | Status: DC | PRN
  Administered 2022-08-29: 500 mL

## 2022-08-29 MED ORDER — SODIUM CHLORIDE 0.9% FLUSH: 3.0000 mL | INTRAVENOUS | Status: DC | PRN

## 2022-08-29 MED ORDER — ONDANSETRON HCL 4 MG/2ML IJ SOLN
4.0000 mg | Freq: Four times a day (QID) | INTRAMUSCULAR | Status: DC | PRN
  Administered 2022-08-29: 4 mg via INTRAVENOUS
  Filled 2022-08-29: qty 2

## 2022-08-29 MED ORDER — SODIUM CHLORIDE 0.9 % IV SOLN: 250.0000 mL | INTRAVENOUS | Status: DC | PRN

## 2022-08-29 MED ORDER — HEPARIN (PORCINE) IN NACL 1000-0.9 UT/500ML-% IV SOLN
INTRAVENOUS | Status: AC
  Filled 2022-08-29: qty 500

## 2022-08-29 SURGICAL SUPPLY — 8 items
BAG DRAINAGE 600ML DEPOT (BAG) IMPLANT
BAG DRN 24 TWST VLV ADJ (BAG) ×1
DRAPE BRACHIAL (DRAPES) IMPLANT
DRAPE FEMORAL ANGIO W/ POUCH (DRAPES) IMPLANT
KIT MICRO 4FR STIFFIN 15 (SHEATH) IMPLANT
PACK CARDIAC CATH (CUSTOM PROCEDURE TRAY) IMPLANT
TRAY PERICARDIOCENTESIS 6FX60 (TRAY / TRAY PROCEDURE) IMPLANT
TUBING CIL FLEX 10 FLL-RA (TUBING) IMPLANT

## 2022-08-29 NOTE — TOC Progression Note (Signed)
Transition of Care [facility], [place]) - Progression Note    Patient Details  Name: Robert [name] MRN: [id number] Date of Birth: [birth year]  Transition of Care [facility]) CM/SW Contact  Allena Katz, LCSW Phone Number: 08/29/2022, 10:02 AM  Clinical Narrative:  Pt to have pericardiocentesis today. CSW will follow and update patient on plan when closer to discharge.          Expected Discharge Plan and Services                                               Social Determinants of Health (SDOH) Interventions SDOH Screenings   Food Insecurity: No Food Insecurity (08/19/2022)  Housing: Low Risk  (08/19/2022)  Transportation Needs: No Transportation Needs (08/19/2022)  Utilities: Not At Risk (08/19/2022)  Alcohol Screen: Low Risk  (10/29/2020)  Depression (PHQ2-9): Low Risk  (09/12/2021)  Financial Resource Strain: Low Risk  (10/29/2020)  Physical Activity: Inactive (10/29/2020)  Social Connections: Moderately Isolated (10/29/2020)  Stress: No Stress Concern Present (10/29/2020)  Tobacco Use: Low Risk  (08/22/2022)    Readmission Risk Interventions     No data to display
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
## 2022-08-29 NOTE — Progress Notes (Signed)
OT Cancellation Note  Patient Details Name: Robert [name] MRN: [id number] DOB: 1945/06/27   Cancelled Treatment:    Reason Eval/Treat Not Completed: Patient at procedure or test/ unavailable. Pt off unit for pericardiocentesis. Will check back as medically appropriate.   [name] [name], OTR/L  08/29/22, 2:25 PM

## 2022-08-29 NOTE — Progress Notes (Signed)
PT Cancellation Note  Patient Details Name: Robert [name] MRN: [id number] DOB: 20-Nov-1945   Cancelled Treatment:    Reason Eval/Treat Not Completed: Patient at procedure or test/unavailable.  Pt currently off floor for procedure.  Will re-attempt PT session at a later date/time.  Hendricks Limes, PT 08/29/22, 2:40 PM

## 2022-08-29 NOTE — Progress Notes (Signed)
*  PRELIMINARY RESULTS* Echocardiogram A Limited 2D Echocardiogram has been performed.  Carolyne Fiscal 08/29/2022, 4:04 PM

## 2022-08-29 NOTE — H&P (View-Only) (Signed)
Patient ID: Robert [name], male   DOB: Sep 22, 1945, 77 y.o.   MRN: [id number]     Advanced Heart Failure Rounding Note  PCP-Cardiologist: None   Subjective:    Weight down yesterday with diuresis.  Creatinine stable 1.52.  BP stable.  Patient reports breathing is better.   Hgb stable 8.2, no overt bleeding.   I reviewed yesterday's echo: The pericardial effusion looks a bit larger than prior and there does appear to be early tamponade with slight RV diastolic indentation and significant respirophasic variation of mitral E inflow velocity.  IVC not visualized.    Objective:   Weight Range: (!) 150.1 kg Body mass index is 42.49 kg/m.   Vital Signs:   Temp:  [97.8 F (36.6 C)-98.6 F (37 C)] 98.6 F (37 C) (07/09 0822) Pulse Rate:  [84-91] 86 (07/09 0822) Resp:  [17-19] 18 (07/09 0822) BP: (118-132)/(68-92) 121/79 (07/09 0822) SpO2:  [96 %-98 %] 96 % (07/09 0822) Weight:  [150.1 kg] 150.1 kg (07/09 0448) Last BM Date : 08/28/22  Weight change: Filed Weights   08/27/22 0500 08/28/22 0442 08/29/22 0448  Weight: (!) 153.5 kg (!) 154.1 kg (!) 150.1 kg    Intake/Output:   Intake/Output Summary (Last 24 hours) at 08/29/2022 0908 Last data filed at 08/29/2022 0416 Gross per 24 hour  Intake --  Output 1200 ml  Net -1200 ml      Physical Exam    General:  Well appearing. No resp difficulty HEENT: Normal Neck: Supple. JVP 8-9 cm. Carotids 2+ bilat; no bruits. No lymphadenopathy or thyromegaly appreciated. Cor: PMI nondisplaced. Regular rate & rhythm. No rubs, gallops or murmurs. Lungs: Clear Abdomen: Soft, nontender, nondistended. No hepatosplenomegaly. No bruits or masses. Good bowel sounds. Extremities: No cyanosis, clubbing, rash, edema Neuro: Alert & orientedx3, cranial nerves grossly intact. moves all 4 extremities w/o difficulty. Affect pleasant  Labs    CBC Recent Labs    08/27/22 0632 08/28/22 0517 08/29/22 0459  WBC 4.0  3.9* 3.6* 4.2  NEUTROABS 1.2*   --   --   HGB 7.9*  7.9* 8.3* 8.2*  HCT 26.0*  25.8* 26.9* 27.0*  MCV 87.5  87.5 88.5 87.9  PLT 134*  140* 125* 125*   Basic Metabolic Panel Recent Labs    40/98/11 0632 08/28/22 0517 08/29/22 0459  NA 137 136 139  K 3.7 4.4 4.0  CL 100 100 99  CO2 26 26 28   GLUCOSE 99 92 101*  BUN 24* 25* 24*  CREATININE 1.34* 1.55* 1.52*  CALCIUM 8.7* 8.5* 8.7*  MG 2.0  --   --   PHOS 3.7  --   --    Liver Function Tests No results for input(s): "AST", "ALT", "ALKPHOS", "BILITOT", "PROT", "ALBUMIN" in the last 72 hours. No results for input(s): "LIPASE", "AMYLASE" in the last 72 hours. Cardiac Enzymes No results for input(s): "CKTOTAL", "CKMB", "CKMBINDEX", "TROPONINI" in the last 72 hours.  BNP: BNP (last 3 results) Recent Labs    08/22/22 1149  BNP 445.4*    ProBNP (last 3 results) No results for input(s): "PROBNP" in the last 8760 hours.   D-Dimer No results for input(s): "DDIMER" in the last 72 hours. Hemoglobin A1C No results for input(s): "HGBA1C" in the last 72 hours. Fasting Lipid Panel No results for input(s): "CHOL", "HDL", "LDLCALC", "TRIG", "CHOLHDL", "LDLDIRECT" in the last 72 hours. Thyroid Function Tests No results for input(s): "TSH", "T4TOTAL", "T3FREE", "THYROIDAB" in the last 72 hours.  Invalid input(s): "FREET3"  Other results:   Imaging    ECHOCARDIOGRAM LIMITED  Result Date: 08/28/2022    ECHOCARDIOGRAM LIMITED REPORT   Patient Name:   Robert [name] Date of Exam: 08/28/2022 Medical Rec #:  [id number]          Height:       74.0 in Accession #:    [id number]         Weight:       339.7 lb Date of Birth:  Jul 05, 1945         BSA:          2.722 m Patient Age:    73 years           BP:           129/74 mmHg Patient Gender: M                  HR:           87 bpm. Exam Location:  [facility] Procedure: Limited Echo, Cardiac Doppler and Color Doppler Indications:     Pericardial effusion  History:         Patient has prior history of Echocardiogram  examinations, most                  recent 08/22/2022. CHF, Signs/Symptoms:Dizziness/Lightheadedness                  and Shortness of Breath; Risk Factors:Hypertension, Diabetes                  and Dyslipidemia. CKD.  Sonographer:     Mikki Harbor Referring Phys:  [id number] Eliot Ford Aariah Godette Diagnosing Phys: Wilfred Lacy  Sonographer Comments: Technically difficult study due to poor echo windows and patient is obese. IMPRESSIONS  1. Left ventricular ejection fraction, by estimation, is 55 to 60%. The left ventricle has normal function. The left ventricle has no regional wall motion abnormalities. There is mild left ventricular hypertrophy.  2. Right ventricular systolic function is normal. The right ventricular size is normal.  3. The mitral valve is normal in structure. No evidence of mitral valve regurgitation. No evidence of mitral stenosis.  4. The aortic valve is tricuspid. Aortic valve regurgitation is not visualized. No aortic stenosis is present.  5. Moderate pericardial effusion. The pericardial effusion is circumferential. Findings are consistent with cardiac tamponade. There is > 25% respirophasic variation in the mitral E inflow velocity. In the subcostal images, there is slight but present diastolic indentation of the RV free wall. The IVC was not visualized.  6. Left atrial size was mildly dilated. FINDINGS  Left Ventricle: Left ventricular ejection fraction, by estimation, is 55 to 60%. The left ventricle has normal function. The left ventricle has no regional wall motion abnormalities. The left ventricular internal cavity size was normal in size. There is  mild left ventricular hypertrophy. Right Ventricle: The right ventricular size is normal. No increase in right ventricular wall thickness. Right ventricular systolic function is normal. Left Atrium: Left atrial size was mildly dilated. Right Atrium: Right atrial size was normal in size. Pericardium: A moderately sized pericardial effusion is  present. The pericardial effusion is circumferential. The pericardial effusion appears to contain fibrous material. There is evidence of cardiac tamponade. Mitral Valve: The mitral valve is normal in structure. No evidence of mitral valve stenosis. Tricuspid Valve: The tricuspid valve is normal in structure. Tricuspid valve regurgitation is not demonstrated. No evidence of tricuspid stenosis. Aortic Valve: The aortic valve is  tricuspid. Aortic valve regurgitation is not visualized. No aortic stenosis is present. Pulmonic Valve: The pulmonic valve was normal in structure. Pulmonic valve regurgitation is not visualized. No evidence of pulmonic stenosis. Aorta: The aortic root is normal in size and structure. Venous: The inferior vena cava was not well visualized. IAS/Shunts: No atrial level shunt detected by color flow Doppler. LEFT VENTRICLE PLAX 2D LVIDd:         4.80 cm LVIDs:         3.80 cm LV PW:         1.30 cm LV IVS:        1.20 cm LVOT diam:     2.00 cm LVOT Area:     3.14 cm  LEFT ATRIUM         Index LA diam:    3.10 cm 1.14 cm/m   AORTA Ao Root diam: 4.10 cm  SHUNTS Systemic Diam: 2.00 cm Megann Easterwood McleanMD Electronically signed by Wilfred Lacy Signature Date/Time: 08/28/2022/6:03:01 PM    Final      Medications:     Scheduled Medications:  bisacodyl  10 mg Oral BID   cloNIDine  0.3 mg Oral Daily   dapagliflozin propanediol  10 mg Oral Daily   docusate sodium  100 mg Oral BID   DULoxetine  60 mg Oral BID   fesoterodine  4 mg Oral Daily   furosemide  40 mg Intravenous BID   guaiFENesin  600 mg Oral BID   hydrocortisone   Topical BID   insulin aspart  0-15 Units Subcutaneous TID WC   insulin aspart  0-5 Units Subcutaneous QHS   insulin glargine-yfgn  25 Units Subcutaneous Daily   melatonin  5 mg Oral QHS   metoprolol succinate  100 mg Oral Daily   pantoprazole  40 mg Oral BID   polyethylene glycol  17 g Oral BID   potassium chloride  40 mEq Oral Daily   simvastatin  20 mg Oral q1800    sodium chloride flush  10-40 mL Intracatheter Q12H   sodium chloride flush  3 mL Intravenous Q12H   sodium chloride flush  3 mL Intravenous Q12H    Infusions:   PRN Medications: acetaminophen **OR** acetaminophen, bisacodyl, chlorpheniramine-HYDROcodone, fluticasone, ipratropium-albuterol, methocarbamol, ondansetron (ZOFRAN) IV, sodium chloride flush, traMADol, traZODone   Assessment/Plan   1. GI bleeding: Lower GI bleed.  Hgb 8.2 today after last transfusion 7/7 (stable). Colonoscopy showed a 5 cm sigmoid mass as well as another concerning lesion in the ascending colon.  The sigmoid mass biopsy showed adenocarcinoma, the ascending colon lesion was not biopsied.   - Ultimately, plan for advanced endoscopy at [facility] for removal of the ascending colon lesion and then partial resection of colon for sigmoid lesion.  No beds for transfer at this time though.  2. Acute on chronic diastolic CHF: In setting of possible early tamponade. Echo on 7/2 showed EF 50-55%, normal RV, equivocal tamponade physiology with moderate pericardial effusion.  Have been treating with gentle diuresis.  He has been hemodynamically stable.   - Can continue Lasix 40 mg IV bid for now  - Continue Farxiga 10 mg daily.  - Pericardiocentesis as below. 3. Pericardial effusion: Incidentally found.  Currently with no chest pain.  Cause uncertain, ?viral pericarditis.  Prior echoes showed no definite tamponade and he has remained hemodynamically stable. However, echo yesterday showed that the effusion appears a bit larger and I think that there is early tamponade physiology with slight RV diastolic indentation seen  in subcostal views as well as significant respirophasic variation of the mitral E inflow on doppler evaluation (IVC not visualized).  He is not clinically unstable, but with some progression in the effusion, I think that our best course at this time will be pericardiocentesis.  - Discussed with Dr End, pericardiocentesis  will be today. Discussed risks/benefits with patient and he agrees to procedure.  4. CKD stage 3: Suspect due to DM and HTN.  Creatinine ranging 1.3-1.5, follow with diuresis.  Stable at 1.52 today.  5. PAD: Has been on statin and Plavix, Plavix currently on hold with GI bleeding and need for eventual surgery.     Length of Stay: 31  Marca Ancona, MD  08/29/2022, 9:08 AM  Advanced Heart Failure Team Pager [phone] (M-F; 7a - 5p)  Please contact [facility] for night-coverage after hours (5p -7a ) and weekends on [email or web address]

## 2022-08-29 NOTE — Progress Notes (Signed)
Patient ID: Robert [name], male   DOB: 04-18-1945, 77 y.o.   MRN: [id number]     Advanced Heart Failure Rounding Note  PCP-Cardiologist: None   Subjective:    Weight down yesterday with diuresis.  Creatinine stable 1.52.  BP stable.  Patient reports breathing is better.   Hgb stable 8.2, no overt bleeding.   I reviewed yesterday's echo: The pericardial effusion looks a bit larger than prior and there does appear to be early tamponade with slight RV diastolic indentation and significant respirophasic variation of mitral E inflow velocity.  IVC not visualized.    Objective:   Weight Range: (!) 150.1 kg Body mass index is 42.49 kg/m.   Vital Signs:   Temp:  [97.8 F (36.6 C)-98.6 F (37 C)] 98.6 F (37 C) (07/09 0822) Pulse Rate:  [84-91] 86 (07/09 0822) Resp:  [17-19] 18 (07/09 0822) BP: (118-132)/(68-92) 121/79 (07/09 0822) SpO2:  [96 %-98 %] 96 % (07/09 0822) Weight:  [150.1 kg] 150.1 kg (07/09 0448) Last BM Date : 08/28/22  Weight change: Filed Weights   08/27/22 0500 08/28/22 0442 08/29/22 0448  Weight: (!) 153.5 kg (!) 154.1 kg (!) 150.1 kg    Intake/Output:   Intake/Output Summary (Last 24 hours) at 08/29/2022 0908 Last data filed at 08/29/2022 0416 Gross per 24 hour  Intake --  Output 1200 ml  Net -1200 ml      Physical Exam    General:  Well appearing. No resp difficulty HEENT: Normal Neck: Supple. JVP 8-9 cm. Carotids 2+ bilat; no bruits. No lymphadenopathy or thyromegaly appreciated. Cor: PMI nondisplaced. Regular rate & rhythm. No rubs, gallops or murmurs. Lungs: Clear Abdomen: Soft, nontender, nondistended. No hepatosplenomegaly. No bruits or masses. Good bowel sounds. Extremities: No cyanosis, clubbing, rash, edema Neuro: Alert & orientedx3, cranial nerves grossly intact. moves all 4 extremities w/o difficulty. Affect pleasant  Labs    CBC Recent Labs    08/27/22 0632 08/28/22 0517 08/29/22 0459  WBC 4.0  3.9* 3.6* 4.2  NEUTROABS 1.2*   --   --   HGB 7.9*  7.9* 8.3* 8.2*  HCT 26.0*  25.8* 26.9* 27.0*  MCV 87.5  87.5 88.5 87.9  PLT 134*  140* 125* 125*   Basic Metabolic Panel Recent Labs    86/57/84 0632 08/28/22 0517 08/29/22 0459  NA 137 136 139  K 3.7 4.4 4.0  CL 100 100 99  CO2 26 26 28   GLUCOSE 99 92 101*  BUN 24* 25* 24*  CREATININE 1.34* 1.55* 1.52*  CALCIUM 8.7* 8.5* 8.7*  MG 2.0  --   --   PHOS 3.7  --   --    Liver Function Tests No results for input(s): "AST", "ALT", "ALKPHOS", "BILITOT", "PROT", "ALBUMIN" in the last 72 hours. No results for input(s): "LIPASE", "AMYLASE" in the last 72 hours. Cardiac Enzymes No results for input(s): "CKTOTAL", "CKMB", "CKMBINDEX", "TROPONINI" in the last 72 hours.  BNP: BNP (last 3 results) Recent Labs    08/22/22 1149  BNP 445.4*    ProBNP (last 3 results) No results for input(s): "PROBNP" in the last 8760 hours.   D-Dimer No results for input(s): "DDIMER" in the last 72 hours. Hemoglobin A1C No results for input(s): "HGBA1C" in the last 72 hours. Fasting Lipid Panel No results for input(s): "CHOL", "HDL", "LDLCALC", "TRIG", "CHOLHDL", "LDLDIRECT" in the last 72 hours. Thyroid Function Tests No results for input(s): "TSH", "T4TOTAL", "T3FREE", "THYROIDAB" in the last 72 hours.  Invalid input(s): "FREET3"  Other results:   Imaging    ECHOCARDIOGRAM LIMITED  Result Date: 08/28/2022    ECHOCARDIOGRAM LIMITED REPORT   Patient Name:   Robert [name] Date of Exam: 08/28/2022 Medical Rec #:  [id number]          Height:       74.0 in Accession #:    [id number]         Weight:       339.7 lb Date of Birth:  [birth year]         BSA:          2.722 m Patient Age:    37 years           BP:           129/74 mmHg Patient Gender: M                  HR:           87 bpm. Exam Location:  [facility] Procedure: Limited Echo, Cardiac Doppler and Color Doppler Indications:     Pericardial effusion  History:         Patient has prior history of Echocardiogram  examinations, most                  recent 08/22/2022. CHF, Signs/Symptoms:Dizziness/Lightheadedness                  and Shortness of Breath; Risk Factors:Hypertension, Diabetes                  and Dyslipidemia. CKD.  Sonographer:     Mikki Harbor Referring Phys:  [phone] Eliot Ford Loyd Marhe[place]ka Diagnosing Phys: Wilfred Lacy  Sonographer Comments: Technically difficult study due to poor echo windows and patient is obese. IMPRESSIONS  1. Left ventricular ejection fraction, by estimation, is 55 to 60%. The left ventricle has normal function. The left ventricle has no regional wall motion abnormalities. There is mild left ventricular hypertrophy.  2. Right ventricular systolic function is normal. The right ventricular size is normal.  3. The mitral valve is normal in structure. No evidence of mitral valve regurgitation. No evidence of mitral stenosis.  4. The aortic valve is tricuspid. Aortic valve regurgitation is not visualized. No aortic stenosis is present.  5. Moderate pericardial effusion. The pericardial effusion is circumferential. Findings are consistent with cardiac tamponade. There is > 25% respirophasic variation in the mitral E inflow velocity. In the subcostal images, there is slight but present diastolic indentation of the RV free wall. The IVC was not visualized.  6. Left atrial size was mildly dilated. FINDINGS  Left Ventricle: Left ventricular ejection fraction, by estimation, is 55 to 60%. The left ventricle has normal function. The left ventricle has no regional wall motion abnormalities. The left ventricular internal cavity size was normal in size. There is  mild left ventricular hypertrophy. Right Ventricle: The right ventricular size is normal. No increase in right ventricular wall thickness. Right ventricular systolic function is normal. Left Atrium: Left atrial size was mildly dilated. Right Atrium: Right atrial size was normal in size. Pericardium: A moderately sized pericardial effusion is  present. The pericardial effusion is circumferential. The pericardial effusion appears to contain fibrous material. There is evidence of cardiac tamponade. Mitral Valve: The mitral valve is normal in structure. No evidence of mitral valve stenosis. Tricuspid Valve: The tricuspid valve is normal in structure. Tricuspid valve regurgitation is not demonstrated. No evidence of tricuspid stenosis. Aortic Valve: The aortic valve is  tricuspid. Aortic valve regurgitation is not visualized. No aortic stenosis is present. Pulmonic Valve: The pulmonic valve was normal in structure. Pulmonic valve regurgitation is not visualized. No evidence of pulmonic stenosis. Aorta: The aortic root is normal in size and structure. Venous: The inferior vena cava was not well visualized. IAS/Shunts: No atrial level shunt detected by color flow Doppler. LEFT VENTRICLE PLAX 2D LVIDd:         4.80 cm LVIDs:         3.80 cm LV PW:         1.30 cm LV IVS:        1.20 cm LVOT diam:     2.00 cm LVOT Area:     3.14 cm  LEFT ATRIUM         Index LA diam:    3.10 cm 1.14 cm/m   AORTA Ao Root diam: 4.10 cm  SHUNTS Systemic Diam: 2.00 cm Barnard Sharps McleanMD Electronically signed by Wilfred Lacy Signature Date/Time: 08/28/2022/6:03:01 PM    Final      Medications:     Scheduled Medications:  bisacodyl  10 mg Oral BID   cloNIDine  0.3 mg Oral Daily   dapagliflozin propanediol  10 mg Oral Daily   docusate sodium  100 mg Oral BID   DULoxetine  60 mg Oral BID   fesoterodine  4 mg Oral Daily   furosemide  40 mg Intravenous BID   guaiFENesin  600 mg Oral BID   hydrocortisone   Topical BID   insulin aspart  0-15 Units Subcutaneous TID WC   insulin aspart  0-5 Units Subcutaneous QHS   insulin glargine-yfgn  25 Units Subcutaneous Daily   melatonin  5 mg Oral QHS   metoprolol succinate  100 mg Oral Daily   pantoprazole  40 mg Oral BID   polyethylene glycol  17 g Oral BID   potassium chloride  40 mEq Oral Daily   simvastatin  20 mg Oral q1800    sodium chloride flush  10-40 mL Intracatheter Q12H   sodium chloride flush  3 mL Intravenous Q12H   sodium chloride flush  3 mL Intravenous Q12H    Infusions:   PRN Medications: acetaminophen **OR** acetaminophen, bisacodyl, chlorpheniramine-HYDROcodone, fluticasone, ipratropium-albuterol, methocarbamol, ondansetron (ZOFRAN) IV, sodium chloride flush, traMADol, traZODone   Assessment/Plan   1. GI bleeding: Lower GI bleed.  Hgb 8.2 today after last transfusion 7/7 (stable). Colonoscopy showed a 5 cm sigmoid mass as well as another concerning lesion in the ascending colon.  The sigmoid mass biopsy showed adenocarcinoma, the ascending colon lesion was not biopsied.   - Ultimately, plan for advanced endoscopy at [facility] for removal of the ascending colon lesion and then partial resection of colon for sigmoid lesion.  No beds for transfer at this time though.  2. Acute on chronic diastolic CHF: In setting of possible early tamponade. Echo on 7/2 showed EF 50-55%, normal RV, equivocal tamponade physiology with moderate pericardial effusion.  Have been treating with gentle diuresis.  He has been hemodynamically stable.   - Can continue Lasix 40 mg IV bid for now  - Continue Farxiga 10 mg daily.  - Pericardiocentesis as below. 3. Pericardial effusion: Incidentally found.  Currently with no chest pain.  Cause uncertain, ?viral pericarditis.  Prior echoes showed no definite tamponade and he has remained hemodynamically stable. However, echo yesterday showed that the effusion appears a bit larger and I think that there is early tamponade physiology with slight RV diastolic indentation seen  in subcostal views as well as significant respirophasic variation of the mitral E inflow on doppler evaluation (IVC not visualized).  He is not clinically unstable, but with some progression in the effusion, I think that our best course at this time will be pericardiocentesis.  - Discussed with Dr End, pericardiocentesis  will be today. Discussed risks/benefits with patient and he agrees to procedure.  4. CKD stage 3: Suspect due to DM and HTN.  Creatinine ranging 1.3-1.5, follow with diuresis.  Stable at 1.52 today.  5. PAD: Has been on statin and Plavix, Plavix currently on hold with GI bleeding and need for eventual surgery.     Length of Stay: 58  Marca Ancona, MD  08/29/2022, 9:08 AM  Advanced Heart Failure Team Pager [phone] (M-F; 7a - 5p)  Please contact [facility] for night-coverage after hours (5p -7a ) and weekends on [email or web address]

## 2022-08-29 NOTE — Interval H&P Note (Signed)
History and Physical Interval Note:  08/29/2022 3:07 PM  Robert [name]  has presented today for surgery, with the diagnosis of pericardial effusion with tamponade physiology.  The various methods of treatment have been discussed with the patient and family. After consideration of risks, benefits and other options for treatment, the patient has consented to  Procedure(s): PERICARDIOCENTESIS (N/A) as a surgical intervention.  The patient's history has been reviewed, patient examined, no change in status, stable for surgery.  I have reviewed the patient's chart and labs.  Questions were answered to the patient's satisfaction.  He has agreed to rescind his DNR for the duration of the procedure.   Tenlee Wollin

## 2022-08-29 NOTE — Plan of Care (Signed)
Problem: Education: Goal: Ability to describe self-care measures that may prevent or decrease complications (Diabetes Survival Skills Education) will improve 08/29/2022 2217 by Cameron Ali, RN Outcome: Progressing 08/29/2022 2216 by Cameron Ali, RN Outcome: Progressing Goal: Individualized Educational Video(s) 08/29/2022 2217 by Cameron Ali, RN Outcome: Progressing 08/29/2022 2216 by Cameron Ali, RN Outcome: Progressing   Problem: Coping: Goal: Ability to adjust to condition or change in health will improve 08/29/2022 2217 by Cameron Ali, RN Outcome: Progressing 08/29/2022 2216 by Cameron Ali, RN Outcome: Progressing   Problem: Fluid Volume: Goal: Ability to maintain a balanced intake and output will improve 08/29/2022 2217 by Cameron Ali, RN Outcome: Progressing 08/29/2022 2216 by Cameron Ali, RN Outcome: Progressing   Problem: Health Behavior/Discharge Planning: Goal: Ability to identify and utilize available resources and services will improve 08/29/2022 2217 by Cameron Ali, RN Outcome: Progressing 08/29/2022 2216 by Cameron Ali, RN Outcome: Progressing Goal: Ability to manage health-related needs will improve 08/29/2022 2217 by Cameron Ali, RN Outcome: Progressing 08/29/2022 2216 by Cameron Ali, RN Outcome: Progressing   Problem: Metabolic: Goal: Ability to maintain appropriate glucose levels will improve 08/29/2022 2217 by Cameron Ali, RN Outcome: Progressing 08/29/2022 2216 by Cameron Ali, RN Outcome: Progressing   Problem: Nutritional: Goal: Maintenance of adequate nutrition will improve 08/29/2022 2217 by Cameron Ali, RN Outcome: Progressing 08/29/2022 2216 by Cameron Ali, RN Outcome: Progressing Goal: Progress toward achieving an optimal weight will improve 08/29/2022 2217 by Cameron Ali, RN Outcome: Progressing 08/29/2022 2216 by  Cameron Ali, RN Outcome: Progressing   Problem: Skin Integrity: Goal: Risk for impaired skin integrity will decrease 08/29/2022 2217 by Cameron Ali, RN Outcome: Progressing 08/29/2022 2216 by Cameron Ali, RN Outcome: Progressing   Problem: Tissue Perfusion: Goal: Adequacy of tissue perfusion will improve 08/29/2022 2217 by Cameron Ali, RN Outcome: Progressing 08/29/2022 2216 by Cameron Ali, RN Outcome: Progressing   Problem: Education: Goal: Knowledge of General Education information will improve Description: Including pain rating scale, medication(s)/side effects and non-pharmacologic comfort measures 08/29/2022 2217 by Cameron Ali, RN Outcome: Progressing 08/29/2022 2216 by Cameron Ali, RN Outcome: Progressing   Problem: Health Behavior/Discharge Planning: Goal: Ability to manage health-related needs will improve 08/29/2022 2217 by Cameron Ali, RN Outcome: Progressing 08/29/2022 2216 by Cameron Ali, RN Outcome: Progressing   Problem: Clinical Measurements: Goal: Ability to maintain clinical measurements within normal limits will improve 08/29/2022 2217 by Cameron Ali, RN Outcome: Progressing 08/29/2022 2216 by Cameron Ali, RN Outcome: Progressing Goal: Will remain free from infection 08/29/2022 2217 by Cameron Ali, RN Outcome: Progressing 08/29/2022 2216 by Cameron Ali, RN Outcome: Progressing Goal: Diagnostic test results will improve 08/29/2022 2217 by Cameron Ali, RN Outcome: Progressing 08/29/2022 2216 by Cameron Ali, RN Outcome: Progressing Goal: Respiratory complications will improve 08/29/2022 2217 by Cameron Ali, RN Outcome: Progressing 08/29/2022 2216 by Cameron Ali, RN Outcome: Progressing Goal: Cardiovascular complication will be avoided 08/29/2022 2217 by Cameron Ali, RN Outcome: Progressing 08/29/2022 2216 by Cameron Ali, RN Outcome: Progressing   Problem: Activity: Goal: Risk for activity intolerance will decrease 08/29/2022 2217 by Cameron Ali, RN Outcome: Progressing 08/29/2022 2216 by Cameron Ali, RN Outcome: Progressing   Problem: Nutrition: Goal: Adequate nutrition will be maintained 08/29/2022 2217 by Cameron Ali, RN Outcome: Progressing 08/29/2022 2216 by Cameron Ali, RN Outcome: Progressing  Problem: Coping: Goal: Level of anxiety will decrease 08/29/2022 2217 by Cameron Ali, RN Outcome: Progressing 08/29/2022 2216 by Cameron Ali, RN Outcome: Progressing   Problem: Elimination: Goal: Will not experience complications related to bowel motility 08/29/2022 2217 by Cameron Ali, RN Outcome: Progressing 08/29/2022 2216 by Cameron Ali, RN Outcome: Progressing Goal: Will not experience complications related to urinary retention 08/29/2022 2217 by Cameron Ali, RN Outcome: Progressing 08/29/2022 2216 by Cameron Ali, RN Outcome: Progressing   Problem: Pain Managment: Goal: General experience of comfort will improve 08/29/2022 2217 by Cameron Ali, RN Outcome: Progressing 08/29/2022 2216 by Cameron Ali, RN Outcome: Progressing   Problem: Safety: Goal: Ability to remain free from injury will improve 08/29/2022 2217 by Cameron Ali, RN Outcome: Progressing 08/29/2022 2216 by Cameron Ali, RN Outcome: Progressing   Problem: Skin Integrity: Goal: Risk for impaired skin integrity will decrease 08/29/2022 2217 by Cameron Ali, RN Outcome: Progressing 08/29/2022 2216 by Cameron Ali, RN Outcome: Progressing   Problem: Education: Goal: Ability to identify signs and symptoms of gastrointestinal bleeding will improve 08/29/2022 2217 by Cameron Ali, RN Outcome: Progressing 08/29/2022 2216 by Cameron Ali, RN Outcome: Progressing   Problem:  Bowel/Gastric: Goal: Will show no signs and symptoms of gastrointestinal bleeding 08/29/2022 2217 by Cameron Ali, RN Outcome: Progressing 08/29/2022 2216 by Cameron Ali, RN Outcome: Progressing   Problem: Fluid Volume: Goal: Will show no signs and symptoms of excessive bleeding 08/29/2022 2217 by Cameron Ali, RN Outcome: Progressing 08/29/2022 2216 by Cameron Ali, RN Outcome: Progressing   Problem: Clinical Measurements: Goal: Complications related to the disease process, condition or treatment will be avoided or minimized 08/29/2022 2217 by Cameron Ali, RN Outcome: Progressing 08/29/2022 2216 by Cameron Ali, RN Outcome: Progressing   Problem: Education: Goal: Understanding of CV disease, CV risk reduction, and recovery process will improve 08/29/2022 2217 by Cameron Ali, RN Outcome: Progressing 08/29/2022 2216 by Cameron Ali, RN Outcome: Progressing Goal: Individualized Educational Video(s) 08/29/2022 2217 by Cameron Ali, RN Outcome: Progressing 08/29/2022 2216 by Cameron Ali, RN Outcome: Progressing   Problem: Activity: Goal: Ability to return to baseline activity level will improve 08/29/2022 2217 by Cameron Ali, RN Outcome: Progressing 08/29/2022 2216 by Cameron Ali, RN Outcome: Progressing   Problem: Cardiovascular: Goal: Ability to achieve and maintain adequate cardiovascular perfusion will improve 08/29/2022 2217 by Cameron Ali, RN Outcome: Progressing 08/29/2022 2216 by Cameron Ali, RN Outcome: Progressing Goal: Vascular access site(s) Level 0-1 will be maintained 08/29/2022 2217 by Cameron Ali, RN Outcome: Progressing 08/29/2022 2216 by Cameron Ali, RN Outcome: Progressing   Problem: Health Behavior/Discharge Planning: Goal: Ability to safely manage health-related needs after discharge will improve 08/29/2022 2217 by Cameron Ali,  RN Outcome: Progressing 08/29/2022 2216 by Cameron Ali, RN Outcome: Progressing

## 2022-08-29 NOTE — TOC Benefit Eligibility Note (Signed)
Pharmacy Patient Advocate Encounter  Insurance verification completed.    The patient is insured through [facility] Medicare Part D  Ran test claim for Farxiga 10 mg and the current 30 day co-pay is $45.00.   This test claim was processed through [facility]- copay amounts may vary at other pharmacies due to pharmacy/plan contracts, or as the patient moves through the different stages of their insurance plan.    Roland Earl, CPHT Pharmacy Patient Advocate Specialist [facility] Patient Advocate Team Direct Number: [phone]  Fax: [phone]

## 2022-08-30 ENCOUNTER — Inpatient Hospital Stay (HOSPITAL_COMMUNITY)
Admit: 2022-08-30 | Discharge: 2022-08-30 | Disposition: A | Discharge status: Home / Self Care | Payer: Medicare Other | Attending: Internal Medicine | Admitting: Internal Medicine

## 2022-08-30 ENCOUNTER — Encounter: Payer: Self-pay | Admitting: Internal Medicine

## 2022-08-30 DIAGNOSIS — Z515 Encounter for palliative care: Secondary | ICD-10-CM | POA: Diagnosis not present

## 2022-08-30 DIAGNOSIS — I3139 Other pericardial effusion (noninflammatory): Secondary | ICD-10-CM | POA: Diagnosis not present

## 2022-08-30 DIAGNOSIS — I314 Cardiac tamponade: Secondary | ICD-10-CM

## 2022-08-30 DIAGNOSIS — K921 Melena: Secondary | ICD-10-CM | POA: Diagnosis not present

## 2022-08-30 DIAGNOSIS — Z7189 Other specified counseling: Secondary | ICD-10-CM | POA: Diagnosis not present

## 2022-08-30 DIAGNOSIS — C187 Malignant neoplasm of sigmoid colon: Secondary | ICD-10-CM | POA: Diagnosis not present

## 2022-08-30 LAB — GLUCOSE, CAPILLARY
Glucose-Capillary: 104 mg/dL — ABNORMAL HIGH (ref 70–99)
Glucose-Capillary: 101 mg/dL — ABNORMAL HIGH (ref 70–99)
Glucose-Capillary: 152 mg/dL — ABNORMAL HIGH (ref 70–99)
Glucose-Capillary: 126 mg/dL — ABNORMAL HIGH (ref 70–99)
Glucose-Capillary: 126 mg/dL — ABNORMAL HIGH (ref 70–99)
Glucose-Capillary: 116 mg/dL — ABNORMAL HIGH (ref 70–99)

## 2022-08-30 LAB — BASIC METABOLIC PANEL
Anion gap: 10 (ref 5–15)
BUN: 27 mg/dL — ABNORMAL HIGH (ref 8–23)
CO2: 30 mmol/L (ref 22–32)
Calcium: 8.6 mg/dL — ABNORMAL LOW (ref 8.9–10.3)
Chloride: 99 mmol/L (ref 98–111)
Creatinine, Ser: 1.66 mg/dL — ABNORMAL HIGH (ref 0.61–1.24)
GFR, Estimated: 42 mL/min — ABNORMAL LOW (ref >60)
Glucose, Bld: 108 mg/dL — ABNORMAL HIGH (ref 70–99)
Potassium: 4 mmol/L (ref 3.5–5.1)
Sodium: 139 mmol/L (ref 135–145)

## 2022-08-30 LAB — CBC
HCT: 27.1 % — ABNORMAL LOW (ref 39.0–52.0)
Hemoglobin: 8.2 g/dL — ABNORMAL LOW (ref 13.0–17.0)
MCH: 26.8 pg (ref 26.0–34.0)
MCHC: 30.3 g/dL (ref 30.0–36.0)
MCV: 88.6 fL (ref 80.0–100.0)
Platelets: 131 10*3/uL — ABNORMAL LOW (ref 150–400)
RBC: 3.06 MIL/uL — ABNORMAL LOW (ref 4.22–5.81)
RDW: 16.2 % — ABNORMAL HIGH (ref 11.5–15.5)
WBC: 3.5 10*3/uL — ABNORMAL LOW (ref 4.0–10.5)
nRBC: 0.6 % — ABNORMAL HIGH (ref 0.0–0.2)

## 2022-08-30 LAB — MULTIPLE MYELOMA PANEL, SERUM
Albumin SerPl Elph-Mcnc: 2.5 g/dL — ABNORMAL LOW (ref 2.9–4.4)
Albumin/Glob SerPl: 0.8 (ref 0.7–1.7)
Alpha 1: 0.5 g/dL — ABNORMAL HIGH (ref 0.0–0.4)
Alpha2 Glob SerPl Elph-Mcnc: 1 g/dL (ref 0.4–1.0)
B-Globulin SerPl Elph-Mcnc: 1 g/dL (ref 0.7–1.3)
Gamma Glob SerPl Elph-Mcnc: 0.9 g/dL (ref 0.4–1.8)
Globulin, Total: 3.4 g/dL (ref 2.2–3.9)
IgA: 193 mg/dL (ref 61–437)
IgG (Immunoglobin G), Serum: 983 mg/dL (ref 603–1613)
IgM (Immunoglobulin M), Srm: 43 mg/dL (ref 15–143)
Total Protein ELP: 5.9 g/dL — ABNORMAL LOW (ref 6.0–8.5)

## 2022-08-30 LAB — ECHOCARDIOGRAM LIMITED
Height: 74 in
Weight: 4589.1 oz

## 2022-08-30 LAB — LD, BODY FLUID (OTHER): LD, Body Fluid: 798 IU/L

## 2022-08-30 LAB — PROTEIN, BODY FLUID (OTHER): Total Protein, Body Fluid Other: 5.3 g/dL

## 2022-08-30 LAB — GLUCOSE, BODY FLUID OTHER: Glucose, Body Fluid Other: 40 mg/dL

## 2022-08-30 MED ORDER — FUROSEMIDE 20 MG PO TABS
40.0000 mg | ORAL_TABLET | Freq: Every day | ORAL | Status: DC
  Administered 2022-08-30 – 2022-09-02 (×4): 40 mg via ORAL
  Filled 2022-08-30 (×4): qty 2
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
## 2022-08-30 NOTE — Plan of Care (Signed)
  Problem: Education: Goal: Ability to describe self-care measures that may prevent or decrease complications (Diabetes Survival Skills Education) will improve Outcome: Progressing Goal: Individualized Educational Video(s) Outcome: Progressing   Problem: Coping: Goal: Ability to adjust to condition or change in health will improve Outcome: Progressing   Problem: Fluid Volume: Goal: Ability to maintain a balanced intake and output will improve Outcome: Progressing   Problem: Health Behavior/Discharge Planning: Goal: Ability to identify and utilize available resources and services will improve Outcome: Progressing Goal: Ability to manage health-related needs will improve Outcome: Progressing   Problem: Metabolic: Goal: Ability to maintain appropriate glucose levels will improve Outcome: Progressing   Problem: Nutritional: Goal: Maintenance of adequate nutrition will improve Outcome: Progressing Goal: Progress toward achieving an optimal weight will improve Outcome: Progressing   Problem: Skin Integrity: Goal: Risk for impaired skin integrity will decrease Outcome: Progressing   Problem: Tissue Perfusion: Goal: Adequacy of tissue perfusion will improve Outcome: Progressing   Problem: Education: Goal: Knowledge of General Education information will improve Description: Including pain rating scale, medication(s)/side effects and non-pharmacologic comfort measures Outcome: Progressing   Problem: Health Behavior/Discharge Planning: Goal: Ability to manage health-related needs will improve Outcome: Progressing   Problem: Clinical Measurements: Goal: Ability to maintain clinical measurements within normal limits will improve Outcome: Progressing Goal: Will remain free from infection Outcome: Progressing Goal: Diagnostic test results will improve Outcome: Progressing Goal: Respiratory complications will improve Outcome: Progressing Goal: Cardiovascular complication will  be avoided Outcome: Progressing   Problem: Activity: Goal: Risk for activity intolerance will decrease Outcome: Progressing   Problem: Nutrition: Goal: Adequate nutrition will be maintained Outcome: Progressing   Problem: Coping: Goal: Level of anxiety will decrease Outcome: Progressing   Problem: Elimination: Goal: Will not experience complications related to bowel motility Outcome: Progressing Goal: Will not experience complications related to urinary retention Outcome: Progressing   Problem: Pain Managment: Goal: General experience of comfort will improve Outcome: Progressing   Problem: Safety: Goal: Ability to remain free from injury will improve Outcome: Progressing   Problem: Skin Integrity: Goal: Risk for impaired skin integrity will decrease Outcome: Progressing   Problem: Education: Goal: Ability to identify signs and symptoms of gastrointestinal bleeding will improve Outcome: Progressing   Problem: Bowel/Gastric: Goal: Will show no signs and symptoms of gastrointestinal bleeding Outcome: Progressing   Problem: Fluid Volume: Goal: Will show no signs and symptoms of excessive bleeding Outcome: Progressing   Problem: Clinical Measurements: Goal: Complications related to the disease process, condition or treatment will be avoided or minimized Outcome: Progressing   Problem: Education: Goal: Understanding of CV disease, CV risk reduction, and recovery process will improve Outcome: Progressing Goal: Individualized Educational Video(s) Outcome: Progressing   Problem: Activity: Goal: Ability to return to baseline activity level will improve Outcome: Progressing   Problem: Cardiovascular: Goal: Ability to achieve and maintain adequate cardiovascular perfusion will improve Outcome: Progressing Goal: Vascular access site(s) Level 0-1 will be maintained Outcome: Progressing   Problem: Health Behavior/Discharge Planning: Goal: Ability to safely manage  health-related needs after discharge will improve Outcome: Progressing
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
## 2022-08-30 NOTE — Progress Notes (Signed)
Patient ID: Robert [name], male   DOB: 07-11-1945, 77 y.o.   MRN: [id number]     Advanced Heart Failure Rounding Note  PCP-Cardiologist: None   Subjective:    Pericardiocentesis 7/9 with removal of 850 cc bloody fluid. 40 cc out from drain overnight.   Hgb stable 8.2, no overt bleeding.   Patient states that breathing is improved post-pericardiocentesis.    Objective:   Weight Range: 130.1 kg Body mass index is 36.83 kg/m.   Vital Signs:   Temp:  [97.6 F (36.4 C)-98.6 F (37 C)] 97.9 F (36.6 C) (07/10 0400) Pulse Rate:  [79-87] 84 (07/10 0700) Resp:  [11-20] 15 (07/10 0700) BP: (94-134)/(61-86) 125/68 (07/10 0700) SpO2:  [91 %-100 %] 97 % (07/10 0700) Weight:  [130.1 kg-145.9 kg] 130.1 kg (07/10 0400) Last BM Date : 08/28/22  Weight change: Filed Weights   08/29/22 0448 08/29/22 1622 08/30/22 0400  Weight: (!) 150.1 kg (!) 145.9 kg 130.1 kg    Intake/Output:   Intake/Output Summary (Last 24 hours) at 08/30/2022 0759 Last data filed at 08/30/2022 0521 Gross per 24 hour  Intake 1111.83 ml  Output 1590 ml  Net -478.17 ml      Physical Exam    General: NAD Neck: Thick, JVP 7-8 cm, no thyromegaly or thyroid nodule.  Lungs: Clear to auscultation bilaterally with normal respiratory effort. CV: Nondisplaced PMI.  Heart regular S1/S2, no S3/S4, no murmur.  No peripheral edema.   Abdomen: Soft, nontender, no hepatosplenomegaly, no distention.  Skin: Intact without lesions or rashes.  Neurologic: Alert and oriented x 3.  Psych: Normal affect. Extremities: No clubbing or cyanosis.  HEENT: Normal.    Labs    CBC Recent Labs    08/29/22 0459 08/30/22 0524  WBC 4.2 3.5*  HGB 8.2* 8.2*  HCT 27.0* 27.1*  MCV 87.9 88.6  PLT 125* 131*   Basic Metabolic Panel Recent Labs    84/69/62 0459 08/30/22 0524  NA 139 139  K 4.0 4.0  CL 99 99  CO2 28 30  GLUCOSE 101* 108*  BUN 24* 27*  CREATININE 1.52* 1.66*  CALCIUM 8.7* 8.6*   Liver Function  Tests No results for input(s): "AST", "ALT", "ALKPHOS", "BILITOT", "PROT", "ALBUMIN" in the last 72 hours. No results for input(s): "LIPASE", "AMYLASE" in the last 72 hours. Cardiac Enzymes No results for input(s): "CKTOTAL", "CKMB", "CKMBINDEX", "TROPONINI" in the last 72 hours.  BNP: BNP (last 3 results) Recent Labs    08/22/22 1149  BNP 445.4*    ProBNP (last 3 results) No results for input(s): "PROBNP" in the last 8760 hours.   D-Dimer No results for input(s): "DDIMER" in the last 72 hours. Hemoglobin A1C No results for input(s): "HGBA1C" in the last 72 hours. Fasting Lipid Panel No results for input(s): "CHOL", "HDL", "LDLCALC", "TRIG", "CHOLHDL", "LDLDIRECT" in the last 72 hours. Thyroid Function Tests No results for input(s): "TSH", "T4TOTAL", "T3FREE", "THYROIDAB" in the last 72 hours.  Invalid input(s): "FREET3"  Other results:   Imaging    ECHOCARDIOGRAM LIMITED  Result Date: 08/29/2022    ECHOCARDIOGRAM LIMITED REPORT   Patient Name:   Robert [name] Date of Exam: 08/29/2022 Medical Rec #:  [id number]          Height:       74.0 in Accession #:    [id number]         Weight:       330.9 lb Date of Birth:  06-07-1945  BSA:          2.692 m Patient Age:    76 years           BP:           94/79 mmHg Patient Gender: M                  HR:           85 bpm. Exam Location:  [facility] Procedure: Limited Echo Indications:     Pericardial effusion  History:         Patient has prior history of Echocardiogram examinations, most                  recent 08/28/2022. CHF, Signs/Symptoms:Dizziness/Lightheadedness                  and Shortness of Breath; Risk Factors:Hypertension, Diabetes                  and Dyslipidemia. For pericardiocentesis guidance.  Sonographer:     Mikki Harbor Referring Phys:  [phone] CHRISTOPHER END Diagnosing Phys: Yvonne Kendall MD IMPRESSIONS  1. Limited study for pericardiocentesis guidance. Initial apical images show large effusion. Echogenic  material in pericardial space is consistent with agitated saline. Final images show trivial residual pericardial effusion. Echogenic material near the  RV apex may represent epicardial fat, thrombus, or other organized material. FINDINGS  Pericardium: Limited study for pericardiocentesis guidance. Initial apical images show large effusion. Echogenic material in pericardial space is consistent with agitated saline. Final images show trivial residual pericardial effusion. Echogenic material near the RV apex may represent epicardial fat, thrombus, or other organized material. Yvonne Kendall MD Electronically signed by Yvonne Kendall MD Signature Date/Time: 08/29/2022/5:18:39 PM    Final    DG Chest Port 1 View  Result Date: 08/29/2022 CLINICAL DATA:  Chest pain, post pericardiocentesis EXAM: PORTABLE CHEST 1 VIEW COMPARISON:  08/22/2022 FINDINGS: Cardiomegaly, vascular congestion. No overt edema or confluent opacities. No visible pleural effusions or pneumothorax. Small catheter noted in the left lower chest, likely pericardial drain. No acute bony abnormality. IMPRESSION: Cardiomegaly, vascular congestion. Electronically Signed   By: Charlett Nose M.D.   On: 08/29/2022 17:05   CARDIAC CATHETERIZATION  Result Date: 08/29/2022 Conclusions: Successful ultrasound and fluoroscopic-guided pericardiocentesis and pericardial drain placement from the apical approach, yielding 850 mL of bloody fluid.  Fluid sent for routine analyses and cytology. Trivial residual pericardial effusion following pericardiocentesis. Recommendations: Transfer to ICU while pericardial drain remains in place. Obtain STAT portable chest x-ray. Keep drainage back to gravity. Repeat limited echocardiogram tomorrow.  Plan to remove drain when minimal residual fluid remains on echo and drain output is less than 50-100 mL/24 hours. Yvonne Kendall, MD [facility]    Medications:     Scheduled Medications:  Chlorhexidine Gluconate Cloth  6  each Topical Daily   cloNIDine  0.3 mg Oral Daily   dapagliflozin propanediol  10 mg Oral Daily   docusate sodium  100 mg Oral BID   DULoxetine  60 mg Oral BID   fesoterodine  4 mg Oral Daily   furosemide  40 mg Oral Daily   guaiFENesin  600 mg Oral BID   hydrocortisone   Topical BID   insulin aspart  0-15 Units Subcutaneous TID WC   insulin aspart  0-5 Units Subcutaneous QHS   insulin glargine-yfgn  25 Units Subcutaneous Daily   melatonin  5 mg Oral QHS   metoprolol succinate  100 mg  Oral Daily   pantoprazole  40 mg Oral BID   polyethylene glycol  17 g Oral BID   potassium chloride  40 mEq Oral Daily   simvastatin  20 mg Oral q1800   sodium chloride flush  10-40 mL Intracatheter Q12H   sodium chloride flush  3 mL Intravenous Q12H   sodium chloride flush  3 mL Intravenous Q12H   sodium chloride flush  5 mL Intracatheter Q8H    Infusions:   PRN Medications: acetaminophen **OR** acetaminophen, bisacodyl, chlorpheniramine-HYDROcodone, fluticasone, ipratropium-albuterol, methocarbamol, ondansetron (ZOFRAN) IV, sodium chloride flush, traMADol, traZODone   Assessment/Plan   1. GI bleeding: Lower GI bleed.  Hgb 8.2 today after last transfusion 7/7 (stable). Colonoscopy showed a 5 cm sigmoid mass as well as another concerning lesion in the ascending colon.  The sigmoid mass biopsy showed adenocarcinoma, the ascending colon lesion was not biopsied.   - Ultimately, plan for advanced endoscopy at [facility] for removal of the ascending colon lesion and then partial resection of colon for sigmoid lesion.  No beds for transfer at this time though.  2. Acute on chronic diastolic CHF: In setting of possible early tamponade. Echo with EF 50-55%, normal RV, early tamponade.  Have been treating with gentle diuresis and had pericardiocentesis ultimately on 7/9.  Symptomatically improved post-pericardiocentesis.  Weight has trended down.  He does not appear significantly volume overloaded today.  -  Transition Lasix to 40 mg po daily.   - Continue Farxiga 10 mg daily.  3. Pericardial effusion: Incidentally found.  Currently with no chest pain.  Cause uncertain, ?viral pericarditis.  Prior echoes showed no definite tamponade and he has remained hemodynamically stable. However, echo 7/8 showed that the effusion was larger and I think that there was early tamponade physiology with slight RV diastolic indentation seen in subcostal views as well as significant respirophasic variation of the mitral E inflow on doppler evaluation (IVC not visualized).  He underwent pericardiocentesis 7/9, 850 cc bloody fluid out. He had about 40 cc out overnight from drain.  - Fluid cultures NGTD, awaiting cytology.  - Limited echo today to reassess pericardial space.  - Once daily drainage drops down below 50-100 cc, will remove.  4. CKD stage 3: Suspect due to DM and HTN.  Creatinine ranging 1.3-1.5, follow with diuresis.  Mildly higher at 1.66 today.  - transition diuretic to po.  5. PAD: Has been on statin and Plavix, Plavix currently on hold with GI bleeding and need for eventual surgery.    Length of Stay: 12  Marca Ancona, MD  08/30/2022, 7:59 AM  Advanced Heart Failure Team Pager [phone] (M-F; 7a - 5p)  Please contact [facility] for night-coverage after hours (5p -7a ) and weekends on [email or web address]

## 2022-08-30 NOTE — Progress Notes (Signed)
*  PRELIMINARY RESULTS* Echocardiogram A Limited 2D Echocardiogram has been performed.  Carolyne Fiscal 08/30/2022, 2:59 PM

## 2022-08-31 DIAGNOSIS — K921 Melena: Secondary | ICD-10-CM | POA: Diagnosis not present

## 2022-08-31 DIAGNOSIS — I314 Cardiac tamponade: Secondary | ICD-10-CM | POA: Diagnosis not present

## 2022-08-31 LAB — BASIC METABOLIC PANEL
Anion gap: 11 (ref 5–15)
BUN: 28 mg/dL — ABNORMAL HIGH (ref 8–23)
CO2: 28 mmol/L (ref 22–32)
Calcium: 8.4 mg/dL — ABNORMAL LOW (ref 8.9–10.3)
Chloride: 97 mmol/L — ABNORMAL LOW (ref 98–111)
Creatinine, Ser: 1.63 mg/dL — ABNORMAL HIGH (ref 0.61–1.24)
GFR, Estimated: 43 mL/min — ABNORMAL LOW (ref >60)
Glucose, Bld: 108 mg/dL — ABNORMAL HIGH (ref 70–99)
Potassium: 4.1 mmol/L (ref 3.5–5.1)
Sodium: 136 mmol/L (ref 135–145)

## 2022-08-31 LAB — GLUCOSE, CAPILLARY
Glucose-Capillary: 97 mg/dL (ref 70–99)
Glucose-Capillary: 164 mg/dL — ABNORMAL HIGH (ref 70–99)
Glucose-Capillary: 122 mg/dL — ABNORMAL HIGH (ref 70–99)
Glucose-Capillary: 143 mg/dL — ABNORMAL HIGH (ref 70–99)

## 2022-08-31 LAB — CBC
HCT: 26.1 % — ABNORMAL LOW (ref 39.0–52.0)
Hemoglobin: 8.1 g/dL — ABNORMAL LOW (ref 13.0–17.0)
MCH: 26.9 pg (ref 26.0–34.0)
MCHC: 31 g/dL (ref 30.0–36.0)
MCV: 86.7 fL (ref 80.0–100.0)
Platelets: 130 10*3/uL — ABNORMAL LOW (ref 150–400)
RBC: 3.01 MIL/uL — ABNORMAL LOW (ref 4.22–5.81)
RDW: 16.2 % — ABNORMAL HIGH (ref 11.5–15.5)
WBC: 3.7 10*3/uL — ABNORMAL LOW (ref 4.0–10.5)
nRBC: 0.5 % — ABNORMAL HIGH (ref 0.0–0.2)

## 2022-08-31 NOTE — Plan of Care (Signed)
  Problem: Education: Goal: Ability to describe self-care measures that may prevent or decrease complications (Diabetes Survival Skills Education) will improve Outcome: Progressing Goal: Individualized Educational Video(s) Outcome: Progressing   Problem: Coping: Goal: Ability to adjust to condition or change in health will improve Outcome: Progressing   Problem: Fluid Volume: Goal: Ability to maintain a balanced intake and output will improve Outcome: Progressing   Problem: Health Behavior/Discharge Planning: Goal: Ability to identify and utilize available resources and services will improve Outcome: Progressing Goal: Ability to manage health-related needs will improve Outcome: Progressing   Problem: Metabolic: Goal: Ability to maintain appropriate glucose levels will improve Outcome: Progressing   Problem: Nutritional: Goal: Maintenance of adequate nutrition will improve Outcome: Progressing Goal: Progress toward achieving an optimal weight will improve Outcome: Progressing   Problem: Skin Integrity: Goal: Risk for impaired skin integrity will decrease Outcome: Progressing   Problem: Tissue Perfusion: Goal: Adequacy of tissue perfusion will improve Outcome: Progressing   Problem: Education: Goal: Knowledge of General Education information will improve Description: Including pain rating scale, medication(s)/side effects and non-pharmacologic comfort measures Outcome: Progressing   Problem: Health Behavior/Discharge Planning: Goal: Ability to manage health-related needs will improve Outcome: Progressing   Problem: Clinical Measurements: Goal: Ability to maintain clinical measurements within normal limits will improve Outcome: Progressing Goal: Will remain free from infection Outcome: Progressing Goal: Diagnostic test results will improve Outcome: Progressing Goal: Respiratory complications will improve Outcome: Progressing Goal: Cardiovascular complication will  be avoided Outcome: Progressing   Problem: Activity: Goal: Risk for activity intolerance will decrease Outcome: Progressing   Problem: Nutrition: Goal: Adequate nutrition will be maintained Outcome: Progressing   Problem: Coping: Goal: Level of anxiety will decrease Outcome: Progressing   Problem: Elimination: Goal: Will not experience complications related to bowel motility Outcome: Progressing Goal: Will not experience complications related to urinary retention Outcome: Progressing   Problem: Pain Managment: Goal: General experience of comfort will improve Outcome: Progressing   Problem: Safety: Goal: Ability to remain free from injury will improve Outcome: Progressing   Problem: Skin Integrity: Goal: Risk for impaired skin integrity will decrease Outcome: Progressing   Problem: Education: Goal: Ability to identify signs and symptoms of gastrointestinal bleeding will improve Outcome: Progressing   Problem: Bowel/Gastric: Goal: Will show no signs and symptoms of gastrointestinal bleeding Outcome: Progressing   Problem: Fluid Volume: Goal: Will show no signs and symptoms of excessive bleeding Outcome: Progressing   Problem: Clinical Measurements: Goal: Complications related to the disease process, condition or treatment will be avoided or minimized Outcome: Progressing   Problem: Education: Goal: Understanding of CV disease, CV risk reduction, and recovery process will improve Outcome: Progressing Goal: Individualized Educational Video(s) Outcome: Progressing   Problem: Activity: Goal: Ability to return to baseline activity level will improve Outcome: Progressing   Problem: Cardiovascular: Goal: Ability to achieve and maintain adequate cardiovascular perfusion will improve Outcome: Progressing Goal: Vascular access site(s) Level 0-1 will be maintained Outcome: Progressing   Problem: Health Behavior/Discharge Planning: Goal: Ability to safely manage  health-related needs after discharge will improve Outcome: Progressing   
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
## 2022-08-31 NOTE — Progress Notes (Signed)
Patient ID: Robert [name], male   DOB: [birth year], 77 y.o.   MRN: [id number]     Advanced Heart Failure Rounding Note  PCP-Cardiologist: None   Subjective:    Pericardiocentesis 7/9 with removal of 850 cc bloody fluid. Patient had 55 cc pericardial fluid charted out yesterday but has already drained another 50 cc this morning.   Hgb stable 8.1, no overt bleeding.   Patient states that breathing is improved post-pericardiocentesis.   Echo 7/10 with EF 55-60%, RV normal, IVC small, small residual pericardial effusion.    Objective:   Weight Range: 132.6 kg Body mass index is 37.53 kg/m.   Vital Signs:   Temp:  [97.6 F (36.4 C)-98.8 F (37.1 C)] 97.9 F (36.6 C) (07/11 0350) Pulse Rate:  [80-90] 85 (07/11 0700) Resp:  [11-21] 15 (07/11 0700) BP: (85-135)/(59-103) 121/81 (07/11 0700) SpO2:  [90 %-98 %] 96 % (07/11 0700) Weight:  [132.6 kg] 132.6 kg (07/11 0500) Last BM Date : 08/28/22  Weight change: Filed Weights   08/29/22 1622 08/30/22 0400 08/31/22 0500  Weight: (!) 145.9 kg 130.1 kg 132.6 kg    Intake/Output:   Intake/Output Summary (Last 24 hours) at 08/31/2022 0756 Last data filed at 08/31/2022 0600 Gross per 24 hour  Intake 1410 ml  Output 730 ml  Net 680 ml      Physical Exam    General: NAD Neck: No JVD, no thyromegaly or thyroid nodule.  Lungs: Clear to auscultation bilaterally with normal respiratory effort. CV: Nondisplaced PMI.  Heart regular S1/S2, no S3/S4, no murmur.  No peripheral edema.   Abdomen: Soft, nontender, no hepatosplenomegaly, no distention.  Skin: Intact without lesions or rashes.  Neurologic: Alert and oriented x 3.  Psych: Normal affect. Extremities: No clubbing or cyanosis.  HEENT: Normal.   Labs    CBC Recent Labs    08/30/22 0524 08/31/22 0347  WBC 3.5* 3.7*  HGB 8.2* 8.1*  HCT 27.1* 26.1*  MCV 88.6 86.7  PLT 131* 130*   Basic Metabolic Panel Recent Labs    40/98/11 0524 08/31/22 0347  NA 139 136  K  4.0 4.1  CL 99 97*  CO2 30 28  GLUCOSE 108* 108*  BUN 27* 28*  CREATININE 1.66* 1.63*  CALCIUM 8.6* 8.4*   Liver Function Tests No results for input(s): "AST", "ALT", "ALKPHOS", "BILITOT", "PROT", "ALBUMIN" in the last 72 hours. No results for input(s): "LIPASE", "AMYLASE" in the last 72 hours. Cardiac Enzymes No results for input(s): "CKTOTAL", "CKMB", "CKMBINDEX", "TROPONINI" in the last 72 hours.  BNP: BNP (last 3 results) Recent Labs    08/22/22 1149  BNP 445.4*    ProBNP (last 3 results) No results for input(s): "PROBNP" in the last 8760 hours.   D-Dimer No results for input(s): "DDIMER" in the last 72 hours. Hemoglobin A1C No results for input(s): "HGBA1C" in the last 72 hours. Fasting Lipid Panel No results for input(s): "CHOL", "HDL", "LDLCALC", "TRIG", "CHOLHDL", "LDLDIRECT" in the last 72 hours. Thyroid Function Tests No results for input(s): "TSH", "T4TOTAL", "T3FREE", "THYROIDAB" in the last 72 hours.  Invalid input(s): "FREET3"  Other results:   Imaging    ECHOCARDIOGRAM LIMITED  Result Date: 08/30/2022    ECHOCARDIOGRAM LIMITED REPORT   Patient Name:   Robert [name] Date of Exam: 08/30/2022 Medical Rec #:  [id number]          Height:       74.0 in Accession #:    [id number]  Weight:       286.8 lb Date of Birth:  [birth year]         BSA:          2.533 m Patient Age:    48 years           BP:           118/73 mmHg Patient Gender: M                  HR:           87 bpm. Exam Location:  [facility] Procedure: Limited Echo, Color Doppler and Cardiac Doppler Indications:     Pericardial effusion  History:         Patient has prior history of Echocardiogram examinations, most                  recent 08/29/2022. CHF, Signs/Symptoms:Dizziness/Lightheadedness                  and Shortness of Breath; Risk Factors:Hypertension, Diabetes                  and Dyslipidemia. For reassessment of pericardial effusion s/p                  pericardiocentesis.  Sonographer:      Mikki Harbor Referring Phys:  [phone] CHRISTOPHER END Diagnosing Phys: Julien Nordmann MD  Sonographer Comments: Technically difficult study due to poor echo windows and patient is obese. IMPRESSIONS  1. Left ventricular ejection fraction, by estimation, is 55 to 60%. The left ventricle has normal function. The left ventricle has no regional wall motion abnormalities.  2. Right ventricular systolic function is normal. The right ventricular size is normal.  3. A small pericardial effusion is present. The pericardial effusion is circumferential. 1.14 cm off the LV free wall, 1.4 cm off the RV free wall.. There is no evidence of cardiac tamponade.  4. The mitral valve is normal in structure. No evidence of mitral valve regurgitation. No evidence of mitral stenosis.  5. The aortic valve is normal in structure. Aortic valve regurgitation is not visualized. No aortic stenosis is present.  6. The inferior vena cava is normal in size with greater than 50% respiratory variability, suggesting right atrial pressure of 3 mmHg. FINDINGS  Left Ventricle: Left ventricular ejection fraction, by estimation, is 55 to 60%. The left ventricle has normal function. The left ventricle has no regional wall motion abnormalities. The left ventricular internal cavity size was normal in size. There is  no left ventricular hypertrophy. Right Ventricle: The right ventricular size is normal. No increase in right ventricular wall thickness. Right ventricular systolic function is normal. Left Atrium: Left atrial size was normal in size. Right Atrium: Right atrial size was normal in size. Pericardium: A small pericardial effusion is present. The pericardial effusion is circumferential. There is no evidence of cardiac tamponade. Mitral Valve: The mitral valve is normal in structure. No evidence of mitral valve stenosis. Tricuspid Valve: The tricuspid valve is normal in structure. Tricuspid valve regurgitation is not demonstrated. No evidence of  tricuspid stenosis. Aortic Valve: The aortic valve is normal in structure. Aortic valve regurgitation is not visualized. No aortic stenosis is present. Pulmonic Valve: The pulmonic valve was normal in structure. Pulmonic valve regurgitation is not visualized. No evidence of pulmonic stenosis. Aorta: The aortic root is normal in size and structure. Venous: The inferior vena cava is normal in size with greater than 50% respiratory  variability, suggesting right atrial pressure of 3 mmHg. IAS/Shunts: No atrial level shunt detected by color flow Doppler. Julien Nordmann MD Electronically signed by Julien Nordmann MD Signature Date/Time: 08/30/2022/4:24:07 PM    Final      Medications:     Scheduled Medications:  Chlorhexidine Gluconate Cloth  6 each Topical Daily   cloNIDine  0.3 mg Oral Daily   dapagliflozin propanediol  10 mg Oral Daily   docusate sodium  100 mg Oral BID   DULoxetine  60 mg Oral BID   fesoterodine  4 mg Oral Daily   furosemide  40 mg Oral Daily   guaiFENesin  600 mg Oral BID   insulin aspart  0-15 Units Subcutaneous TID WC   insulin aspart  0-5 Units Subcutaneous QHS   insulin glargine-yfgn  25 Units Subcutaneous Daily   melatonin  5 mg Oral QHS   metoprolol succinate  100 mg Oral Daily   pantoprazole  40 mg Oral BID   polyethylene glycol  17 g Oral BID   potassium chloride  40 mEq Oral Daily   simvastatin  20 mg Oral q1800   sodium chloride flush  10-40 mL Intracatheter Q12H   sodium chloride flush  3 mL Intravenous Q12H   sodium chloride flush  3 mL Intravenous Q12H   sodium chloride flush  5 mL Intracatheter Q8H    Infusions:   PRN Medications: acetaminophen **OR** acetaminophen, bisacodyl, chlorpheniramine-HYDROcodone, fluticasone, ipratropium-albuterol, methocarbamol, ondansetron (ZOFRAN) IV, sodium chloride flush, traMADol, traZODone   Assessment/Plan   1. GI bleeding: Lower GI bleed.  Hgb 8.2 today after last transfusion 7/7 (stable). Colonoscopy showed a 5 cm  sigmoid mass as well as another concerning lesion in the ascending colon.  The sigmoid mass biopsy showed adenocarcinoma, the ascending colon lesion was not biopsied.   - Ultimately, plan for advanced endoscopy at [facility] for removal of the ascending colon lesion and then partial resection of colon for sigmoid lesion.  No beds for transfer at this time though.  2. Acute on chronic diastolic CHF: In setting of possible early tamponade. Echo with EF 50-55%, normal RV, early tamponade.  Have been treating with gentle diuresis and had pericardiocentesis ultimately on 7/9.  Symptomatically improved post-pericardiocentesis.  Weight has trended down.  He does not appear significantly volume overloaded today.  - Continue Lasix 40 mg po daily.   - Continue Farxiga 10 mg daily.  3. Pericardial effusion: Incidentally found.  Currently with no chest pain.  Cause uncertain, ?viral pericarditis.  Prior echoes showed no definite tamponade and he has remained hemodynamically stable. However, echo 7/8 showed that the effusion was larger and I think that there was early tamponade physiology with slight RV diastolic indentation seen in subcostal views as well as significant respirophasic variation of the mitral E inflow on doppler evaluation (IVC not visualized).  He underwent pericardiocentesis 7/9, 850 cc bloody fluid out. Echo yesterday showed small residual pericardial effusion, IVC not dilated.  He had 55 cc pericardial fluid out yesterday but already 50 cc has drained this morning.  - Fluid cultures NGTD, awaiting cytology.  - Still with some drainage, 50 cc out since 4 am.  We will keep drain in 1 more day and reassess tomorrow (hopefully remove if < 100 cc out for the day).   4. CKD stage 3: Suspect due to DM and HTN.  Creatinine ranging 1.3-1.5, follow with diuresis.  Creatinine stable at 1.63.   5. PAD: Has been on statin and Plavix, Plavix currently  on hold with GI bleeding and need for eventual surgery.    Length  of Stay: 21  Marca Ancona, MD  08/31/2022, 7:56 AM  Advanced Heart Failure Team Pager [phone] (M-F; 7a - 5p)  Please contact [facility] for night-coverage after hours (5p -7a ) and weekends on [email or web address]
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
## 2022-08-31 NOTE — Progress Notes (Signed)
   08/31/22 1200  Spiritual Encounters  Type of Visit Initial  Care provided to: Pt and family  Referral source Chaplain assessment  Reason for visit Routine spiritual support  OnCall Visit No  Spiritual Framework  Presenting Themes Courage hope and growth  Patient Stress Factors Not reviewed  Family Stress Factors Not reviewed  Interventions  Spiritual Care Interventions Made Established relationship of care and support;Compassionate presence  Intervention Outcomes  Outcomes Connection to spiritual care  Spiritual Care Plan  Spiritual Care Issues Still Outstanding No further spiritual care needs at this time (see row info)   Stop in patients room to talk to PT and family while on a routine round. PT said pray for him and I educate patient he can contact an chaplain 24/7. I also let the patient know I will be praying for him.

## 2022-09-01 DIAGNOSIS — D649 Anemia, unspecified: Secondary | ICD-10-CM

## 2022-09-01 DIAGNOSIS — I314 Cardiac tamponade: Secondary | ICD-10-CM | POA: Diagnosis not present

## 2022-09-01 DIAGNOSIS — K921 Melena: Secondary | ICD-10-CM | POA: Diagnosis not present

## 2022-09-01 LAB — CBC
HCT: 26.5 % — ABNORMAL LOW (ref 39.0–52.0)
Hemoglobin: 8.1 g/dL — ABNORMAL LOW (ref 13.0–17.0)
MCH: 26.9 pg (ref 26.0–34.0)
MCHC: 30.6 g/dL (ref 30.0–36.0)
MCV: 88 fL (ref 80.0–100.0)
Platelets: 125 10*3/uL — ABNORMAL LOW (ref 150–400)
RBC: 3.01 MIL/uL — ABNORMAL LOW (ref 4.22–5.81)
RDW: 16.1 % — ABNORMAL HIGH (ref 11.5–15.5)
WBC: 3.4 10*3/uL — ABNORMAL LOW (ref 4.0–10.5)
nRBC: 0.6 % — ABNORMAL HIGH (ref 0.0–0.2)

## 2022-09-01 LAB — BASIC METABOLIC PANEL
Anion gap: 8 (ref 5–15)
BUN: 27 mg/dL — ABNORMAL HIGH (ref 8–23)
CO2: 29 mmol/L (ref 22–32)
Calcium: 8.7 mg/dL — ABNORMAL LOW (ref 8.9–10.3)
Chloride: 100 mmol/L (ref 98–111)
Creatinine, Ser: 1.58 mg/dL — ABNORMAL HIGH (ref 0.61–1.24)
GFR, Estimated: 45 mL/min — ABNORMAL LOW (ref >60)
Glucose, Bld: 108 mg/dL — ABNORMAL HIGH (ref 70–99)
Potassium: 3.9 mmol/L (ref 3.5–5.1)
Sodium: 137 mmol/L (ref 135–145)

## 2022-09-01 LAB — BODY FLUID CULTURE W GRAM STAIN: Culture: NO GROWTH

## 2022-09-01 LAB — GLUCOSE, CAPILLARY
Glucose-Capillary: 106 mg/dL — ABNORMAL HIGH (ref 70–99)
Glucose-Capillary: 154 mg/dL — ABNORMAL HIGH (ref 70–99)
Glucose-Capillary: 124 mg/dL — ABNORMAL HIGH (ref 70–99)
Glucose-Capillary: 129 mg/dL — ABNORMAL HIGH (ref 70–99)
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
## 2022-09-01 NOTE — Progress Notes (Addendum)
Patient ID: Robert [name], male   DOB: 1945/11/09, 77 y.o.   MRN: [id number]     Advanced Heart Failure Rounding Note  PCP-Cardiologist: None   Subjective:    Pericardiocentesis 7/9 with removal of 850 cc bloody fluid. 60 cc out over the last day.   Hgb stable 8.1, no overt bleeding.   Patient states that breathing is improved post-pericardiocentesis.   Echo 7/10 with EF 55-60%, RV normal, IVC small, small residual pericardial effusion.    Objective:   Weight Range: 132.6 kg Body mass index is 37.53 kg/m.   Vital Signs:   Temp:  [97.5 F (36.4 C)-97.8 F (36.6 C)] 97.5 F (36.4 C) (07/12 0400) Pulse Rate:  [84-95] 85 (07/12 0600) Resp:  [12-18] 13 (07/12 0600) BP: (65-134)/(56-85) 115/71 (07/12 0600) SpO2:  [87 %-100 %] 99 % (07/12 0600) Last BM Date : 08/28/22  Weight change: Filed Weights   08/29/22 1622 08/30/22 0400 08/31/22 0500  Weight: (!) 145.9 kg 130.1 kg 132.6 kg    Intake/Output:   Intake/Output Summary (Last 24 hours) at 09/01/2022 0801 Last data filed at 09/01/2022 0500 Gross per 24 hour  Intake 530 ml  Output 1410 ml  Net -880 ml      Physical Exam    General: NAD Neck: No JVD, no thyromegaly or thyroid nodule.  Lungs: Clear to auscultation bilaterally with normal respiratory effort. CV: Nondisplaced PMI.  Heart regular S1/S2, no S3/S4, no murmur.  No peripheral edema.  Abdomen: Soft, nontender, no hepatosplenomegaly, no distention.  Skin: Intact without lesions or rashes.  Neurologic: Alert and oriented x 3.  Psych: Normal affect. Extremities: No clubbing or cyanosis.  HEENT: Normal.    Labs    CBC Recent Labs    08/31/22 0347 09/01/22 0627  WBC 3.7* 3.4*  HGB 8.1* 8.1*  HCT 26.1* 26.5*  MCV 86.7 88.0  PLT 130* 125*   Basic Metabolic Panel Recent Labs    78/29/56 0347 09/01/22 0627  NA 136 137  K 4.1 3.9  CL 97* 100  CO2 28 29  GLUCOSE 108* 108*  BUN 28* 27*  CREATININE 1.63* 1.58*  CALCIUM 8.4* 8.7*   Liver  Function Tests No results for input(s): "AST", "ALT", "ALKPHOS", "BILITOT", "PROT", "ALBUMIN" in the last 72 hours. No results for input(s): "LIPASE", "AMYLASE" in the last 72 hours. Cardiac Enzymes No results for input(s): "CKTOTAL", "CKMB", "CKMBINDEX", "TROPONINI" in the last 72 hours.  BNP: BNP (last 3 results) Recent Labs    08/22/22 1149  BNP 445.4*    ProBNP (last 3 results) No results for input(s): "PROBNP" in the last 8760 hours.   D-Dimer No results for input(s): "DDIMER" in the last 72 hours. Hemoglobin A1C No results for input(s): "HGBA1C" in the last 72 hours. Fasting Lipid Panel No results for input(s): "CHOL", "HDL", "LDLCALC", "TRIG", "CHOLHDL", "LDLDIRECT" in the last 72 hours. Thyroid Function Tests No results for input(s): "TSH", "T4TOTAL", "T3FREE", "THYROIDAB" in the last 72 hours.  Invalid input(s): "FREET3"  Other results:   Imaging    No results found.   Medications:     Scheduled Medications:  Chlorhexidine Gluconate Cloth  6 each Topical Daily   cloNIDine  0.3 mg Oral Daily   dapagliflozin propanediol  10 mg Oral Daily   docusate sodium  100 mg Oral BID   DULoxetine  60 mg Oral BID   fesoterodine  4 mg Oral Daily   furosemide  40 mg Oral Daily   guaiFENesin  600 mg  Oral BID   insulin aspart  0-15 Units Subcutaneous TID WC   insulin aspart  0-5 Units Subcutaneous QHS   insulin glargine-yfgn  25 Units Subcutaneous Daily   melatonin  5 mg Oral QHS   metoprolol succinate  100 mg Oral Daily   pantoprazole  40 mg Oral BID   polyethylene glycol  17 g Oral BID   potassium chloride  40 mEq Oral Daily   simvastatin  20 mg Oral q1800    Infusions:   PRN Medications: acetaminophen **OR** acetaminophen, bisacodyl, chlorpheniramine-HYDROcodone, fluticasone, ipratropium-albuterol, methocarbamol, ondansetron (ZOFRAN) IV, traMADol, traZODone   Assessment/Plan   1. GI bleeding: Lower GI bleed.  Hgb 8.2 today after last transfusion 7/7  (stable). Colonoscopy showed a 5 cm sigmoid mass as well as another concerning lesion in the ascending colon.  The sigmoid mass biopsy showed adenocarcinoma, the ascending colon lesion was not biopsied.   - Ultimately, plan for advanced endoscopy at [facility] for removal of the ascending colon lesion and then partial resection of colon for sigmoid lesion.  No beds for transfer at this time though.  2. Acute on chronic diastolic CHF: In setting of possible early tamponade. Echo with EF 50-55%, normal RV, early tamponade.  Have been treating with gentle diuresis and had pericardiocentesis ultimately on 7/9.  Symptomatically improved post-pericardiocentesis.  Weight has trended down.  He does not appear significantly volume overloaded today.  - Continue Lasix 40 mg po daily.   - Continue Farxiga 10 mg daily.  3. Pericardial effusion: Incidentally found.  Currently with no chest pain.  Cause uncertain, ?viral pericarditis.  Prior echoes showed no definite tamponade and he has remained hemodynamically stable. However, echo 7/8 showed that the effusion was larger and I think that there was early tamponade physiology with slight RV diastolic indentation seen in subcostal views as well as significant respirophasic variation of the mitral E inflow on doppler evaluation (IVC not visualized).  He underwent pericardiocentesis 7/9, 850 cc bloody fluid out. Echo post-tap showed small residual pericardial effusion, IVC not dilated.  He had 60 cc pericardial fluid out yesterday.  - Fluid cultures NGTD, awaiting cytology.  - I removed the pericardial drain this morning.  - Limited echo tomorrow to follow pericardial effusion.  4. CKD stage 3: Suspect due to DM and HTN.  Creatinine ranging 1.3-1.5, follow with diuresis.  Creatinine stable at 1.58.   5. PAD: Has been on statin and Plavix, Plavix currently on hold with GI bleeding and need for eventual surgery.   OK to return to telemetry.    Length of Stay: 53  Marca Ancona, MD  09/01/2022, 8:01 AM  Advanced Heart Failure Team Pager [phone] (M-F; 7a - 5p)  Please contact [facility] for night-coverage after hours (5p -7a ) and weekends on [email or web address]

## 2022-09-01 NOTE — Progress Notes (Signed)
0800-M.D. at bedside and updated on pt condition. M.D. removed drain from pt. Pt tolerated well. Pt is alert and oriented. Pts v/s are stable and has no c/o pain or discomfort at this time. Will continue to monitor.

## 2022-09-01 NOTE — Progress Notes (Signed)
Hematology/Oncology Consult note [facility]  [phone] [phone]  Patient Care Team: Corky Downs, MD as PCP - General (Internal Medicine)   Name of the patient: Robert [name]  [id number]  September 30, 1945   Date of visit: 09/01/2022   Interval history-patient is s/p pericardiocentesis and is appearing comfortable today without oxygen.  ECOG PS- 3-4   Review of systems- Review of Systems  Constitutional:  Positive for malaise/fatigue. Negative for chills, fever and weight loss.  HENT:  Negative for congestion, ear discharge and nosebleeds.   Eyes:  Negative for blurred vision.  Respiratory:  Negative for cough, hemoptysis, sputum production, shortness of breath and wheezing.   Cardiovascular:  Negative for chest pain, palpitations, orthopnea and claudication.  Gastrointestinal:  Negative for abdominal pain, blood in stool, constipation, diarrhea, heartburn, melena, nausea and vomiting.  Genitourinary:  Negative for dysuria, flank pain, frequency, hematuria and urgency.  Musculoskeletal:  Negative for back pain, joint pain and myalgias.  Skin:  Negative for rash.  Neurological:  Negative for dizziness, tingling, focal weakness, seizures, weakness and headaches.  Endo/Heme/Allergies:  Does not bruise/bleed easily.  Psychiatric/Behavioral:  Negative for depression and suicidal ideas. The patient does not have insomnia.       No Known Allergies   Past Medical History:  Diagnosis Date   Allergy    Cellulitis    Diabetes mellitus without complication (HCC)    Hyperlipidemia    Hypertension    Peripheral vascular disease [facility])      Past Surgical History:  Procedure Laterality Date   BIOPSY  08/21/2022   Procedure: BIOPSY;  Surgeon: [name], DO;  Location: [facility] ENDOSCOPY;  Service: Gastroenterology;;   COLONOSCOPY WITH PROPOFOL N/A 10/02/2017   Procedure: COLONOSCOPY WITH PROPOFOL;  Surgeon: Wyline Mood, MD;   Location: [facility] ENDOSCOPY;  Service: Gastroenterology;  Laterality: N/A;   COLONOSCOPY WITH PROPOFOL N/A 08/21/2022   Procedure: COLONOSCOPY WITH PROPOFOL;  Surgeon: [name], DO;  Location: [facility] ENDOSCOPY;  Service: Gastroenterology;  Laterality: N/A;   ESOPHAGOGASTRODUODENOSCOPY (EGD) WITH PROPOFOL N/A 08/11/2022   Procedure: ESOPHAGOGASTRODUODENOSCOPY (EGD) WITH PROPOFOL;  Surgeon: Midge Minium, MD;  Location: [facility] ENDOSCOPY;  Service: Endoscopy;  Laterality: N/A;   HERNIA REPAIR     PERICARDIOCENTESIS N/A 08/29/2022   Procedure: PERICARDIOCENTESIS;  Surgeon: Yvonne Kendall, MD;  Location: [facility] INVASIVE CV LAB;  Service: Cardiovascular;  Laterality: N/A;   POLYPECTOMY  08/21/2022   Procedure: POLYPECTOMY;  Surgeon: [name], DO;  Location: [facility] ENDOSCOPY;  Service: Gastroenterology;;    Social History   Socioeconomic History   Marital status: Married    Spouse name: Not on file   Number of children: 0   Years of education: College Gradute   Highest education level: Bachelor's degree (e.g., BA, AB, BS)  Occupational History   Not on file  Tobacco Use   Smoking status: Never   Smokeless tobacco: Never  Vaping Use   Vaping status: Never Used  Substance and Sexual Activity   Alcohol use: Not Currently    Comment: Occasionally   Drug use: No   Sexual activity: Not Currently  Other Topics Concern   Not on file  Social History Narrative   Not on file   Social Determinants of Health   Financial Resource Strain: Low Risk  (10/29/2020)   Overall Financial Resource Strain (CARDIA)    Difficulty of Paying Living Expenses: Not hard at all  Food Insecurity: No Food Insecurity (08/19/2022)   Hunger Vital Sign  Worried About Programme researcher, broadcasting/film/video in the Last Year: Never true    Ran Out of Food in the Last Year: Never true  Transportation Needs: No Transportation Needs (08/19/2022)   PRAPARE - Administrator, Civil Service (Medical): No    Lack of  Transportation (Non-Medical): No  Physical Activity: Inactive (10/29/2020)   Exercise Vital Sign    Days of Exercise per Week: 0 days    Minutes of Exercise per Session: 0 min  Stress: No Stress Concern Present (10/29/2020)   [name] of Occupational Health - Occupational Stress Questionnaire    Feeling of Stress : Not at all  Social Connections: Moderately Isolated (10/29/2020)   Social Connection and Isolation Panel [NHANES]    Frequency of Communication with Friends and Family: Once a week    Frequency of Social Gatherings with Friends and Family: Twice a week    Attends Religious Services: Never    Database administrator or Organizations: No    Attends Banker Meetings: Never    Marital Status: Married  Catering manager Violence: Not At Risk (08/19/2022)   Humiliation, Afraid, Rape, and Kick questionnaire    Fear of Current or Ex-Partner: No    Emotionally Abused: No    Physically Abused: No    Sexually Abused: No    Family History  Problem Relation Age of Onset   Heart disease Mother    Stroke Mother    Heart disease Father    Heart attack Father      Current Facility-Administered Medications:    acetaminophen (TYLENOL) tablet 650 mg, 650 mg, Oral, Q6H PRN, 650 mg at 08/27/22 0725 **OR** acetaminophen (TYLENOL) suppository 650 mg, 650 mg, Rectal, Q6H PRN, End, Christopher, MD   bisacodyl (DULCOLAX) suppository 10 mg, 10 mg, Rectal, Daily PRN, End, Cristal Deer, MD   Chlorhexidine Gluconate Cloth 2 % PADS 6 each, 6 each, Topical, Daily, Gillis Santa, MD, 6 each at 09/01/22 1052   chlorpheniramine-HYDROcodone (TUSSIONEX) 10-8 MG/5ML suspension 5 mL, 5 mL, Oral, Q12H PRN, End, Christopher, MD   cloNIDine (CATAPRES) tablet 0.3 mg, 0.3 mg, Oral, Daily, End, Christopher, MD, 0.3 mg at 09/01/22 1052   dapagliflozin propanediol (FARXIGA) tablet 10 mg, 10 mg, Oral, Daily, End, Christopher, MD, 10 mg at 09/01/22 1055   docusate sodium (COLACE) capsule 100 mg, 100 mg,  Oral, BID, End, Christopher, MD, 100 mg at 09/01/22 1053   DULoxetine (CYMBALTA) DR capsule 60 mg, 60 mg, Oral, BID, End, Christopher, MD, 60 mg at 09/01/22 1054   fesoterodine (TOVIAZ) tablet 4 mg, 4 mg, Oral, Daily, End, Christopher, MD, 4 mg at 09/01/22 1054   fluticasone (FLONASE) 50 MCG/ACT nasal spray 2 spray, 2 spray, Each Nare, BID PRN, End, Christopher, MD   furosemide (LASIX) tablet 40 mg, 40 mg, Oral, Daily, Laurey Morale, MD, 40 mg at 09/01/22 1054   guaiFENesin (MUCINEX) 12 hr tablet 600 mg, 600 mg, Oral, BID, End, Christopher, MD, 600 mg at 09/01/22 1054   insulin aspart (novoLOG) injection 0-15 Units, 0-15 Units, Subcutaneous, TID WC, End, Christopher, MD, 3 Units at 09/01/22 1223   insulin aspart (novoLOG) injection 0-5 Units, 0-5 Units, Subcutaneous, QHS, End, Christopher, MD   insulin glargine-yfgn (SEMGLEE) injection 25 Units, 25 Units, Subcutaneous, Daily, End, Christopher, MD, 25 Units at 09/01/22 1053   ipratropium-albuterol (DUONEB) 0.5-2.5 (3) MG/3ML nebulizer solution 3 mL, 3 mL, Nebulization, Q6H PRN, End, Christopher, MD, 3 mL at 08/24/22 1744   melatonin tablet 5 mg,  5 mg, Oral, QHS, End, Christopher, MD, 5 mg at 08/31/22 2135   methocarbamol (ROBAXIN) tablet 750 mg, 750 mg, Oral, Q8H PRN, End, Christopher, MD, 750 mg at 08/25/22 2028   metoprolol succinate (TOPROL-XL) 24 hr tablet 100 mg, 100 mg, Oral, Daily, End, Christopher, MD, 100 mg at 09/01/22 1053   ondansetron (ZOFRAN) injection 4 mg, 4 mg, Intravenous, Q6H PRN, End, Christopher, MD, 4 mg at 08/29/22 0917   pantoprazole (PROTONIX) EC tablet 40 mg, 40 mg, Oral, BID, End, Christopher, MD, 40 mg at 09/01/22 1053   polyethylene glycol (MIRALAX / GLYCOLAX) packet 17 g, 17 g, Oral, BID, End, Christopher, MD, 17 g at 09/01/22 1056   potassium chloride SA (KLOR-CON M) CR tablet 40 mEq, 40 mEq, Oral, Daily, End, Christopher, MD, 40 mEq at 09/01/22 1054   simvastatin (ZOCOR) tablet 20 mg, 20 mg, Oral, q1800, End,  Christopher, MD, 20 mg at 08/31/22 1656   traMADol (ULTRAM) tablet 50 mg, 50 mg, Oral, Q6H PRN, End, Christopher, MD, 50 mg at 08/30/22 1624   traZODone (DESYREL) tablet 25 mg, 25 mg, Oral, QHS PRN, End, Cristal Deer, MD  Physical exam:  Vitals:   09/01/22 0801 09/01/22 1052 09/01/22 1053 09/01/22 1200  BP:  100/68 100/68   Pulse:   99   Resp:      Temp: 98.4 F (36.9 C)   98.5 F (36.9 C)  TempSrc: Axillary   Axillary  SpO2:      Weight:      Height:       Physical Exam Constitutional:      Comments: Obese.  Appears drowsy but is easily aroused  Cardiovascular:     Rate and Rhythm: Normal rate and regular rhythm.  Pulmonary:     Effort: Pulmonary effort is normal.     Breath sounds: Normal breath sounds.  Abdominal:     General: Bowel sounds are normal.     Palpations: Abdomen is soft.  Skin:    General: Skin is warm and dry.  Neurological:     Mental Status: He is alert and oriented to person, place, and time.         Latest Ref Rng & Units 09/01/2022    6:27 AM  CMP  Glucose 70 - 99 mg/dL 086   BUN 8 - 23 mg/dL 27   Creatinine 5.78 - 1.24 mg/dL 4.69   Sodium 629 - 528 mmol/L 137   Potassium 3.5 - 5.1 mmol/L 3.9   Chloride 98 - 111 mmol/L 100   CO2 22 - 32 mmol/L 29   Calcium 8.9 - 10.3 mg/dL 8.7       Latest Ref Rng & Units 09/01/2022    6:27 AM  CBC  WBC 4.0 - 10.5 K/uL 3.4   Hemoglobin 13.0 - 17.0 g/dL 8.1   Hematocrit 41.3 - 52.0 % 26.5   Platelets 150 - 400 K/uL 125     @IMAGES @  ECHOCARDIOGRAM LIMITED  Result Date: 08/30/2022    ECHOCARDIOGRAM LIMITED REPORT   Patient Name:   CATON TEFFT Date of Exam: 08/30/2022 Medical Rec #:  [id number]          Height:       74.0 in Accession #:    [id number]         Weight:       286.8 lb Date of Birth:  02-22-1945         BSA:          2.533 m  Patient Age:    76 years           BP:           118/73 mmHg Patient Gender: M                  HR:           87 bpm. Exam Location:  [facility] Procedure: Limited Echo,  Color Doppler and Cardiac Doppler Indications:     Pericardial effusion  History:         Patient has prior history of Echocardiogram examinations, most                  recent 08/29/2022. CHF, Signs/Symptoms:Dizziness/Lightheadedness                  and Shortness of Breath; Risk Factors:Hypertension, Diabetes                  and Dyslipidemia. For reassessment of pericardial effusion s/p                  pericardiocentesis.  Sonographer:     Mikki Harbor Referring Phys:  [phone] CHRISTOPHER END Diagnosing Phys: Julien Nordmann MD  Sonographer Comments: Technically difficult study due to poor echo windows and patient is obese. IMPRESSIONS  1. Left ventricular ejection fraction, by estimation, is 55 to 60%. The left ventricle has normal function. The left ventricle has no regional wall motion abnormalities.  2. Right ventricular systolic function is normal. The right ventricular size is normal.  3. A small pericardial effusion is present. The pericardial effusion is circumferential. 1.14 cm off the LV free wall, 1.4 cm off the RV free wall.. There is no evidence of cardiac tamponade.  4. The mitral valve is normal in structure. No evidence of mitral valve regurgitation. No evidence of mitral stenosis.  5. The aortic valve is normal in structure. Aortic valve regurgitation is not visualized. No aortic stenosis is present.  6. The inferior vena cava is normal in size with greater than 50% respiratory variability, suggesting right atrial pressure of 3 mmHg. FINDINGS  Left Ventricle: Left ventricular ejection fraction, by estimation, is 55 to 60%. The left ventricle has normal function. The left ventricle has no regional wall motion abnormalities. The left ventricular internal cavity size was normal in size. There is  no left ventricular hypertrophy. Right Ventricle: The right ventricular size is normal. No increase in right ventricular wall thickness. Right ventricular systolic function is normal. Left Atrium: Left  atrial size was normal in size. Right Atrium: Right atrial size was normal in size. Pericardium: A small pericardial effusion is present. The pericardial effusion is circumferential. There is no evidence of cardiac tamponade. Mitral Valve: The mitral valve is normal in structure. No evidence of mitral valve stenosis. Tricuspid Valve: The tricuspid valve is normal in structure. Tricuspid valve regurgitation is not demonstrated. No evidence of tricuspid stenosis. Aortic Valve: The aortic valve is normal in structure. Aortic valve regurgitation is not visualized. No aortic stenosis is present. Pulmonic Valve: The pulmonic valve was normal in structure. Pulmonic valve regurgitation is not visualized. No evidence of pulmonic stenosis. Aorta: The aortic root is normal in size and structure. Venous: The inferior vena cava is normal in size with greater than 50% respiratory variability, suggesting right atrial pressure of 3 mmHg. IAS/Shunts: No atrial level shunt detected by color flow Doppler. Julien Nordmann MD Electronically signed by Julien Nordmann MD Signature Date/Time: 08/30/2022/4:24:07 PM  Final    ECHOCARDIOGRAM LIMITED  Result Date: 08/29/2022    ECHOCARDIOGRAM LIMITED REPORT   Patient Name:   CAILEN PURNELL Date of Exam: 08/29/2022 Medical Rec #:  [id number]          Height:       74.0 in Accession #:    [id number]         Weight:       330.9 lb Date of Birth:  1945-03-18         BSA:          2.692 m Patient Age:    20 years           BP:           94/79 mmHg Patient Gender: M                  HR:           85 bpm. Exam Location:  [facility] Procedure: Limited Echo Indications:     Pericardial effusion  History:         Patient has prior history of Echocardiogram examinations, most                  recent 08/28/2022. CHF, Signs/Symptoms:Dizziness/Lightheadedness                  and Shortness of Breath; Risk Factors:Hypertension, Diabetes                  and Dyslipidemia. For pericardiocentesis guidance.   Sonographer:     Mikki Harbor Referring Phys:  [phone] CHRISTOPHER END Diagnosing Phys: Yvonne Kendall MD IMPRESSIONS  1. Limited study for pericardiocentesis guidance. Initial apical images show large effusion. Echogenic material in pericardial space is consistent with agitated saline. Final images show trivial residual pericardial effusion. Echogenic material near the  RV apex may represent epicardial fat, thrombus, or other organized material. FINDINGS  Pericardium: Limited study for pericardiocentesis guidance. Initial apical images show large effusion. Echogenic material in pericardial space is consistent with agitated saline. Final images show trivial residual pericardial effusion. Echogenic material near the RV apex may represent epicardial fat, thrombus, or other organized material. Yvonne Kendall MD Electronically signed by Yvonne Kendall MD Signature Date/Time: 08/29/2022/5:18:39 PM    Final    DG Chest Port 1 View  Result Date: 08/29/2022 CLINICAL DATA:  Chest pain, post pericardiocentesis EXAM: PORTABLE CHEST 1 VIEW COMPARISON:  08/22/2022 FINDINGS: Cardiomegaly, vascular congestion. No overt edema or confluent opacities. No visible pleural effusions or pneumothorax. Small catheter noted in the left lower chest, likely pericardial drain. No acute bony abnormality. IMPRESSION: Cardiomegaly, vascular congestion. Electronically Signed   By: Charlett Nose M.D.   On: 08/29/2022 17:05   CARDIAC CATHETERIZATION  Result Date: 08/29/2022 Conclusions: Successful ultrasound and fluoroscopic-guided pericardiocentesis and pericardial drain placement from the apical approach, yielding 850 mL of bloody fluid.  Fluid sent for routine analyses and cytology. Trivial residual pericardial effusion following pericardiocentesis. Recommendations: Transfer to ICU while pericardial drain remains in place. Obtain STAT portable chest x-ray. Keep drainage back to gravity. Repeat limited echocardiogram tomorrow.  Plan to  remove drain when minimal residual fluid remains on echo and drain output is less than 50-100 mL/24 hours. Yvonne Kendall, MD [facility]  ECHOCARDIOGRAM LIMITED  Result Date: 08/28/2022    ECHOCARDIOGRAM LIMITED REPORT   Patient Name:   LEWI BARTOLD Date of Exam: 08/28/2022 Medical Rec #:  [id number]  Height:       74.0 in Accession #:    [id number]         Weight:       339.7 lb Date of Birth:  [birth year]         BSA:          2.722 m Patient Age:    16 years           BP:           129/74 mmHg Patient Gender: M                  HR:           87 bpm. Exam Location:  [facility] Procedure: Limited Echo, Cardiac Doppler and Color Doppler Indications:     Pericardial effusion  History:         Patient has prior history of Echocardiogram examinations, most                  recent 08/22/2022. CHF, Signs/Symptoms:Dizziness/Lightheadedness                  and Shortness of Breath; Risk Factors:Hypertension, Diabetes                  and Dyslipidemia. CKD.  Sonographer:     Mikki Harbor Referring Phys:  [phone] Eliot Ford MCLEAN Diagnosing Phys: Wilfred Lacy  Sonographer Comments: Technically difficult study due to poor echo windows and patient is obese. IMPRESSIONS  1. Left ventricular ejection fraction, by estimation, is 55 to 60%. The left ventricle has normal function. The left ventricle has no regional wall motion abnormalities. There is mild left ventricular hypertrophy.  2. Right ventricular systolic function is normal. The right ventricular size is normal.  3. The mitral valve is normal in structure. No evidence of mitral valve regurgitation. No evidence of mitral stenosis.  4. The aortic valve is tricuspid. Aortic valve regurgitation is not visualized. No aortic stenosis is present.  5. Moderate pericardial effusion. The pericardial effusion is circumferential. Findings are consistent with cardiac tamponade. There is > 25% respirophasic variation in the mitral E inflow velocity. In the subcostal  images, there is slight but present diastolic indentation of the RV free wall. The IVC was not visualized.  6. Left atrial size was mildly dilated. FINDINGS  Left Ventricle: Left ventricular ejection fraction, by estimation, is 55 to 60%. The left ventricle has normal function. The left ventricle has no regional wall motion abnormalities. The left ventricular internal cavity size was normal in size. There is  mild left ventricular hypertrophy. Right Ventricle: The right ventricular size is normal. No increase in right ventricular wall thickness. Right ventricular systolic function is normal. Left Atrium: Left atrial size was mildly dilated. Right Atrium: Right atrial size was normal in size. Pericardium: A moderately sized pericardial effusion is present. The pericardial effusion is circumferential. The pericardial effusion appears to contain fibrous material. There is evidence of cardiac tamponade. Mitral Valve: The mitral valve is normal in structure. No evidence of mitral valve stenosis. Tricuspid Valve: The tricuspid valve is normal in structure. Tricuspid valve regurgitation is not demonstrated. No evidence of tricuspid stenosis. Aortic Valve: The aortic valve is tricuspid. Aortic valve regurgitation is not visualized. No aortic stenosis is present. Pulmonic Valve: The pulmonic valve was normal in structure. Pulmonic valve regurgitation is not visualized. No evidence of pulmonic stenosis. Aorta: The aortic root is normal in size and structure. Venous: The inferior vena cava was  not well visualized. IAS/Shunts: No atrial level shunt detected by color flow Doppler. LEFT VENTRICLE PLAX 2D LVIDd:         4.80 cm LVIDs:         3.80 cm LV PW:         1.30 cm LV IVS:        1.20 cm LVOT diam:     2.00 cm LVOT Area:     3.14 cm  LEFT ATRIUM         Index LA diam:    3.10 cm 1.14 cm/m   AORTA Ao Root diam: 4.10 cm  SHUNTS Systemic Diam: 2.00 cm Dalton McleanMD Electronically signed by Wilfred Lacy Signature  Date/Time: 08/28/2022/6:03:01 PM    Final    CT Angio Chest Pulmonary Embolism (PE) W or WO Contrast  Result Date: 08/22/2022 CLINICAL DATA:  Pulmonary embolism (PE) suspected, high prob EXAM: CT ANGIOGRAPHY CHEST WITH CONTRAST TECHNIQUE: Multidetector CT imaging of the chest was performed using the standard protocol during bolus administration of intravenous contrast. Multiplanar CT image reconstructions and MIPs were obtained to evaluate the vascular anatomy. RADIATION DOSE REDUCTION: This exam was performed according to the departmental dose-optimization program which includes automated exposure control, adjustment of the mA and/or kV according to patient size and/or use of iterative reconstruction technique. CONTRAST:  OMNIPAQUE IOHEXOL 350 MG/ML SOLN COMPARISON:  08/18/2022 FINDINGS: Cardiovascular: Mild cardiomegaly. Moderate pericardial effusion which is increased in size since prior study. Diffuse coronary artery and moderate aortic calcifications. No aneurysm or dissection. No filling defects in the pulmonary arteries to suggest pulmonary emboli. Mediastinum/Nodes: Small and borderline sized mediastinal lymph nodes measuring up to 10 mm in the right paratracheal and subcarinal regions. Similarly sized AP window and prevascular lymph nodes. No hilar or axillary adenopathy. Lungs/Pleura: Small bilateral pleural effusions. Compressive atelectasis in the lower lobes bilaterally. Lingular atelectasis. Upper Abdomen: No acute findings. Right adrenal mixed density mass again noted, unchanged. Musculoskeletal: Chest wall soft tissues are unremarkable. No acute bony abnormality. Review of the MIP images confirms the above findings. IMPRESSION: Moderate pericardial effusion, increased since prior study. Small bilateral pleural effusions, new since prior study. Compressive atelectasis in the lower lobes. Coronary artery disease. No evidence of pulmonary embolus. Aortic Atherosclerosis (ICD10-I70.0).  Electronically Signed   By: Charlett Nose M.D.   On: 08/22/2022 21:12   US Venous Img Lower Bilateral (DVT)  Result Date: 08/22/2022 CLINICAL DATA:  Positive D-dimer and lower extremity pain and swelling, initial encounter EXAM: BILATERAL LOWER EXTREMITY VENOUS DOPPLER ULTRASOUND TECHNIQUE: Gray-scale sonography with graded compression, as well as color Doppler and duplex ultrasound were performed to evaluate the lower extremity deep venous systems from the level of the common femoral vein and including the common femoral, femoral, profunda femoral, popliteal and calf veins including the posterior tibial, peroneal and gastrocnemius veins when visible. The superficial great saphenous vein was also interrogated. Spectral Doppler was utilized to evaluate flow at rest and with distal augmentation maneuvers in the common femoral, femoral and popliteal veins. COMPARISON:  None Available. FINDINGS: RIGHT LOWER EXTREMITY Common Femoral Vein: No evidence of thrombus. Normal compressibility, respiratory phasicity and response to augmentation. Saphenofemoral Junction: No evidence of thrombus. Normal compressibility and flow on color Doppler imaging. Profunda Femoral Vein: No evidence of thrombus. Normal compressibility and flow on color Doppler imaging. Femoral Vein: No evidence of thrombus. Normal compressibility, respiratory phasicity and response to augmentation. Popliteal Vein: No evidence of thrombus. Normal compressibility, respiratory phasicity and response to augmentation. Calf  Veins: No evidence of thrombus. Normal compressibility and flow on color Doppler imaging. Superficial Great Saphenous Vein: No evidence of thrombus. Normal compressibility. Venous Reflux:  None. Other Findings:  Mild calf edema is noted. LEFT LOWER EXTREMITY Common Femoral Vein: No evidence of thrombus. Normal compressibility, respiratory phasicity and response to augmentation. Saphenofemoral Junction: No evidence of thrombus. Normal  compressibility and flow on color Doppler imaging. Profunda Femoral Vein: No evidence of thrombus. Normal compressibility and flow on color Doppler imaging. Femoral Vein: No evidence of thrombus. Normal compressibility, respiratory phasicity and response to augmentation. Popliteal Vein: No evidence of thrombus. Normal compressibility, respiratory phasicity and response to augmentation. Calf Veins: No evidence of thrombus. Normal compressibility and flow on color Doppler imaging. Superficial Great Saphenous Vein: No evidence of thrombus. Normal compressibility. Venous Reflux:  None. Other Findings:  Mild calf edema is noted. IMPRESSION: No evidence of deep venous thrombosis in either lower extremity. Electronically Signed   By: Alcide Clever M.D.   On: 08/22/2022 19:58   ECHOCARDIOGRAM LIMITED  Result Date: 08/22/2022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

## 2022-09-02 ENCOUNTER — Inpatient Hospital Stay (HOSPITAL_COMMUNITY)
Admit: 2022-09-02 | Discharge: 2022-09-02 | Disposition: A | Discharge status: Home / Self Care | Payer: Medicare Other | Attending: Cardiology | Admitting: Cardiology

## 2022-09-02 DIAGNOSIS — I10 Essential (primary) hypertension: Secondary | ICD-10-CM | POA: Diagnosis not present

## 2022-09-02 DIAGNOSIS — I3139 Other pericardial effusion (noninflammatory): Secondary | ICD-10-CM

## 2022-09-02 DIAGNOSIS — K922 Gastrointestinal hemorrhage, unspecified: Secondary | ICD-10-CM

## 2022-09-02 DIAGNOSIS — I5033 Acute on chronic diastolic (congestive) heart failure: Secondary | ICD-10-CM | POA: Diagnosis not present

## 2022-09-02 DIAGNOSIS — N179 Acute kidney failure, unspecified: Secondary | ICD-10-CM | POA: Diagnosis not present

## 2022-09-02 DIAGNOSIS — D61818 Other pancytopenia: Secondary | ICD-10-CM | POA: Diagnosis not present

## 2022-09-02 LAB — CBC
HCT: 26.7 % — ABNORMAL LOW (ref 39.0–52.0)
Hemoglobin: 8.3 g/dL — ABNORMAL LOW (ref 13.0–17.0)
MCH: 26.9 pg (ref 26.0–34.0)
MCHC: 31.1 g/dL (ref 30.0–36.0)
MCV: 86.4 fL (ref 80.0–100.0)
Platelets: 124 10*3/uL — ABNORMAL LOW (ref 150–400)
RBC: 3.09 MIL/uL — ABNORMAL LOW (ref 4.22–5.81)
RDW: 16.1 % — ABNORMAL HIGH (ref 11.5–15.5)
WBC: 3.5 10*3/uL — ABNORMAL LOW (ref 4.0–10.5)
nRBC: 0.6 % — ABNORMAL HIGH (ref 0.0–0.2)

## 2022-09-02 LAB — ECHOCARDIOGRAM LIMITED
Height: 74 in
S' Lateral: 3.3 cm
Weight: 4677.28 oz

## 2022-09-02 LAB — BASIC METABOLIC PANEL
Anion gap: 9 (ref 5–15)
BUN: 25 mg/dL — ABNORMAL HIGH (ref 8–23)
CO2: 29 mmol/L (ref 22–32)
Calcium: 8.6 mg/dL — ABNORMAL LOW (ref 8.9–10.3)
Chloride: 99 mmol/L (ref 98–111)
Creatinine, Ser: 1.5 mg/dL — ABNORMAL HIGH (ref 0.61–1.24)
GFR, Estimated: 48 mL/min — ABNORMAL LOW (ref >60)
Glucose, Bld: 107 mg/dL — ABNORMAL HIGH (ref 70–99)
Potassium: 4.5 mmol/L (ref 3.5–5.1)
Sodium: 137 mmol/L (ref 135–145)

## 2022-09-02 LAB — GLUCOSE, CAPILLARY
Glucose-Capillary: 99 mg/dL (ref 70–99)
Glucose-Capillary: 186 mg/dL — ABNORMAL HIGH (ref 70–99)
Glucose-Capillary: 110 mg/dL — ABNORMAL HIGH (ref 70–99)
Glucose-Capillary: 142 mg/dL — ABNORMAL HIGH (ref 70–99)

## 2022-09-02 LAB — BODY FLUID CULTURE W GRAM STAIN

## 2022-09-02 MED ORDER — FUROSEMIDE 40 MG PO TABS
40.0000 mg | ORAL_TABLET | Freq: Two times a day (BID) | ORAL | Status: DC
  Administered 2022-09-02 – 2022-09-07 (×10): 40 mg via ORAL
  Filled 2022-09-02 (×9): qty 1

## 2022-09-02 NOTE — Progress Notes (Signed)
  Echocardiogram 2D Echocardiogram has been performed.  Robert [name] 09/02/2022, 8:06 AM

## 2022-09-02 NOTE — TOC Progression Note (Signed)
Transition of Care [facility]) - Progression Note    Patient Details  Name: Robert [name] MRN: [id number] Date of Birth: 1945/03/04  [facility] CM/SW Contact  Colette Ribas, [place] Phone Number: 09/02/2022, 9:52 AM  Clinical Narrative:     CSW spoke with wife, she says she is unfamiliar with [facility] so they are still weighing options, gave her the address to [facility] which she expressed she'd like to see it first, She also expressed she'd like more time to decide if they want to him in a [facility] facility. She requested a follow-up on Monday. Merrily Brittle TOC-Weekends [phone]       Expected Discharge Plan and Services                                               Social Determinants of Health (SDOH) Interventions SDOH Screenings   Food Insecurity: No Food Insecurity (08/19/2022)  Housing: Low Risk  (08/19/2022)  Transportation Needs: No Transportation Needs (08/19/2022)  Utilities: Not At Risk (08/19/2022)  Alcohol Screen: Low Risk  (10/29/2020)  Depression (PHQ2-9): Low Risk  (09/12/2021)  Financial Resource Strain: Low Risk  (10/29/2020)  Physical Activity: Inactive (10/29/2020)  Social Connections: Moderately Isolated (10/29/2020)  Stress: No Stress Concern Present (10/29/2020)  Tobacco Use: Low Risk  (08/29/2022)   Received from [facility], [facility]    Readmission Risk Interventions     No data to display
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
## 2022-09-02 NOTE — Progress Notes (Signed)
Patient Name: Robert [name] Date of Encounter: 09/02/2022 [facility] Cardiologist: None   Interval Summary  .    Feeling well.  Denies chest pain or shortness of breath.  Has not been out of the bed yet.  Denies melena or hematochezia.  Vital Signs .    Vitals:   09/02/22 0600 09/02/22 0740 09/02/22 0800 09/02/22 0900  BP:   117/81 118/76  Pulse: 79  86 87  Resp:   14 16  Temp:   98 F (36.7 C)   TempSrc:   Oral   SpO2: 96%  99% 97%  Weight:  125.1 kg    Height:        Intake/Output Summary (Last 24 hours) at 09/02/2022 0958 Last data filed at 09/02/2022 0800 Gross per 24 hour  Intake 320 ml  Output 1700 ml  Net -1380 ml      09/02/2022    7:40 AM 08/31/2022    5:00 AM 08/30/2022    4:00 AM  Last 3 Weights  Weight (lbs) 275 lb 12.7 oz 292 lb 5.3 oz 286 lb 13.1 oz  Weight (kg) 125.1 kg 132.6 kg 130.1 kg      Telemetry/ECG    Sinus rhythm.  No events.- Personally Reviewed  Echo 09/02/2022: IMPRESSIONS     1. Left ventricular ejection fraction, by estimation, is 55 to 60%. The  left ventricle has normal function. There is mild concentric left  ventricular hypertrophy.   2. Small to moderate pericardial effusion remains measuring 1.0 to 1.2 cm  in most regions. Largest collection is adjacent to the right ventricle  measuring up to 2.1 cm. There appear to be some regions of loculation.  Significant improvement compared with   prior to pericardiocentesis. Stable to slightly increased compared with  the echo 08/30/22. IVC remains dilated and does not collapse. Mild septal  bounce noted. Raises concern for ventricular interdependence related to  either tamponade or constriction.   3. The inferior vena cava is dilated in size with <50% respiratory  variability, suggesting right atrial pressure of 15 mmHg.   Physical Exam .    VS:  BP 118/76   Pulse 87   Temp 98 F (36.7 C) (Oral)   Resp 16   Ht 6\' 2"  (1.88 m)   Wt 125.1 kg   SpO2 97%   BMI  35.41 kg/m  , BMI Body mass index is 35.41 kg/m. GENERAL:  Well appearing HEENT: Pupils equal round and reactive, fundi not visualized, oral mucosa unremarkable NECK:  No jugular venous distention, waveform within normal limits, carotid upstroke brisk and symmetric, no bruits, no thyromegaly LUNGS: Breath sounds diminished at the bases.  No crackles or wheezes. HEART:  RRR.  PMI not displaced or sustained,S1 and S2 within normal limits, no S3, no S4, no clicks, no rubs, no murmurs ABD:  Flat, positive bowel sounds normal in frequency in pitch, no bruits, no rebound, no guarding, no midline pulsatile mass, no hepatomegaly, no splenomegaly EXT:  2 plus pulses throughout, no edema, no cyanosis no clubbing SKIN:  No rashes no nodules NEURO:  Cranial nerves II through XII grossly intact, motor grossly intact throughout PSYCH:  Cognitively intact, oriented to person place and time  Assessment & Plan .     # GI bleed: # Colon mass-adenocarcinoma: Known sigmoid colon mass.  Biopsies consistent with adenocarcinoma.   # Acute on chronic diastolic HF:  Diminished breath sounds.  Persistent pericardial effusion.  Will try increasing his  Lasix to twice daily and monitor renal function closely.  BP control as below.  # Pericardial effusion: Patient had a moderate pericardial effusion with signs concerning for tamponade.  He underwent pericardiocentesis 08/30/2022.  Pericardial drain was removed.  Repeat echo today showed persistent mild pericardial effusion slightly increased from prior to removing the drain.  There also appear to be some loculated areas.  IVC is still 15 mmHg indicating that is dilated and does not collapse.  There is some balance in the septum which can indicate concern for tamponade or constriction.  Clinically, he does not seem to have either of these.  Recommend continue to monitor for now.  Diuresis as able.  Recommend repeating echo in 1 week.  If he needs additional intervention he  would need a pericardial window.  With the areas of likely coagulated blood this would not drain with repeat pericardiocentesis.  # CKD 3: Renal function stable.   DM: Per primary team  # HTN:  Currently on clonidine 0.3 mg daily and no other antihypertensives.  Blood pressure seems to be stable on a somewhat odd regimen.  Will need to continue to monitor.  At home he was on amlodipine, clonidine, lisinopril, and metoprolol.  # PAD:  Continue simvastatin.  # Pancytopenia: Per heme-onc and primary team.   For questions or updates, please contact [facility] Please consult [email or web address] for contact info under        Signed, Chilton Si, MD

## 2022-09-02 NOTE — Plan of Care (Signed)
TRANSFERRED TO PCU

## 2022-09-03 DIAGNOSIS — K922 Gastrointestinal hemorrhage, unspecified: Secondary | ICD-10-CM | POA: Diagnosis not present

## 2022-09-03 DIAGNOSIS — I5033 Acute on chronic diastolic (congestive) heart failure: Secondary | ICD-10-CM | POA: Diagnosis not present

## 2022-09-03 DIAGNOSIS — I3139 Other pericardial effusion (noninflammatory): Secondary | ICD-10-CM | POA: Diagnosis not present

## 2022-09-03 DIAGNOSIS — C187 Malignant neoplasm of sigmoid colon: Secondary | ICD-10-CM | POA: Diagnosis not present

## 2022-09-03 LAB — GLUCOSE, CAPILLARY
Glucose-Capillary: 152 mg/dL — ABNORMAL HIGH (ref 70–99)
Glucose-Capillary: 120 mg/dL — ABNORMAL HIGH (ref 70–99)
Glucose-Capillary: 132 mg/dL — ABNORMAL HIGH (ref 70–99)
Glucose-Capillary: 115 mg/dL — ABNORMAL HIGH (ref 70–99)

## 2022-09-03 LAB — BASIC METABOLIC PANEL
Anion gap: 9 (ref 5–15)
BUN: 24 mg/dL — ABNORMAL HIGH (ref 8–23)
CO2: 29 mmol/L (ref 22–32)
Calcium: 8.6 mg/dL — ABNORMAL LOW (ref 8.9–10.3)
Chloride: 98 mmol/L (ref 98–111)
Creatinine, Ser: 1.55 mg/dL — ABNORMAL HIGH (ref 0.61–1.24)
GFR, Estimated: 46 mL/min — ABNORMAL LOW (ref >60)
Glucose, Bld: 108 mg/dL — ABNORMAL HIGH (ref 70–99)
Potassium: 4 mmol/L (ref 3.5–5.1)
Sodium: 136 mmol/L (ref 135–145)

## 2022-09-03 LAB — CBC
HCT: 28.9 % — ABNORMAL LOW (ref 39.0–52.0)
Hemoglobin: 8.9 g/dL — ABNORMAL LOW (ref 13.0–17.0)
MCH: 26.7 pg (ref 26.0–34.0)
MCHC: 30.8 g/dL (ref 30.0–36.0)
MCV: 86.8 fL (ref 80.0–100.0)
Platelets: 133 10*3/uL — ABNORMAL LOW (ref 150–400)
RBC: 3.33 MIL/uL — ABNORMAL LOW (ref 4.22–5.81)
RDW: 16.1 % — ABNORMAL HIGH (ref 11.5–15.5)
WBC: 3.7 10*3/uL — ABNORMAL LOW (ref 4.0–10.5)
nRBC: 0 % (ref 0.0–0.2)
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
## 2022-09-03 NOTE — Progress Notes (Signed)
Patient Name: Robert [name] Date of Encounter: 09/03/2022 [facility] Cardiologist: None   Interval Summary  .    Feeling well.  Denies chest pain or shortness of breath.  Has not been out of the bed yet.  Denies melena or hematochezia.  Vital Signs .    Vitals:   09/03/22 0444 09/03/22 0922 09/03/22 1000 09/03/22 1203  BP: 118/78 101/65  132/73  Pulse: (!) 109 (!) 112  (!) 107  Resp: 18 18 17 18   Temp: 97.8 F (36.6 C) 97.7 F (36.5 C)  98.2 F (36.8 C)  TempSrc:      SpO2: 98% 96%  97%  Weight:      Height:        Intake/Output Summary (Last 24 hours) at 09/03/2022 1217 Last data filed at 09/03/2022 1100 Gross per 24 hour  Intake --  Output 2325 ml  Net -2325 ml      09/02/2022    7:40 AM 08/31/2022    5:00 AM 08/30/2022    4:00 AM  Last 3 Weights  Weight (lbs) 275 lb 12.7 oz 292 lb 5.3 oz 286 lb 13.1 oz  Weight (kg) 125.1 kg 132.6 kg 130.1 kg      Telemetry/ECG    Sinus rhythm.  No events.- Personally Reviewed  Echo 09/02/2022: IMPRESSIONS    1. Left ventricular ejection fraction, by estimation, is 55 to 60%. The  left ventricle has normal function. There is mild concentric left  ventricular hypertrophy.   2. Small to moderate pericardial effusion remains measuring 1.0 to 1.2 cm  in most regions. Largest collection is adjacent to the right ventricle  measuring up to 2.1 cm. There appear to be some regions of loculation.  Significant improvement compared with   prior to pericardiocentesis. Stable to slightly increased compared with  the echo 08/30/22. IVC remains dilated and does not collapse. Mild septal  bounce noted. Raises concern for ventricular interdependence related to  either tamponade or constriction.   3. The inferior vena cava is dilated in size with <50% respiratory  variability, suggesting right atrial pressure of 15 mmHg.   Physical Exam .    VS:  BP 132/73 (BP Location: Right Arm)   Pulse (!) 107   Temp 98.2 F (36.8 C)    Resp 18   Ht 6\' 2"  (1.88 m)   Wt 125.1 kg   SpO2 97%   BMI 35.41 kg/m  , BMI Body mass index is 35.41 kg/m. GENERAL:  Well appearing HEENT: Pupils equal round and reactive, fundi not visualized, oral mucosa unremarkable NECK:  No jugular venous distention, waveform within normal limits, carotid upstroke brisk and symmetric, no bruits, no thyromegaly LUNGS: Breath sounds diminished at the bases.  No crackles or wheezes. HEART:  RRR.  PMI not displaced or sustained,S1 and S2 within normal limits, no S3, no S4, no clicks, no rubs, no murmurs ABD:  Flat, positive bowel sounds normal in frequency in pitch, no bruits, no rebound, no guarding, no midline pulsatile mass, no hepatomegaly, no splenomegaly EXT:  2 plus pulses throughout, no edema, no cyanosis no clubbing SKIN:  No rashes no nodules NEURO:  Cranial nerves II through XII grossly intact, motor grossly intact throughout PSYCH:  Cognitively intact, oriented to person place and time  Assessment & Plan .     # GI bleed: # Colon mass-adenocarcinoma: Known sigmoid colon mass.  Biopsies consistent with adenocarcinoma.  He will start treatment after he gets stronger in therapy.  #  Acute on chronic diastolic HF:  Persistent pericardial effusion.  Lasix increased to twice daily.  Renal function remained stable.  Continue with diuresis.  # Pericardial effusion: Patient had a moderate pericardial effusion with signs concerning for tamponade.  He underwent pericardiocentesis 08/30/2022.  Pericardial drain was removed.  Repeat echo 7/13 showed persistent mild pericardial effusion slightly increased from prior to removing the drain.  There also appear to be some loculated areas.  IVC is still 15 mmHg indicating that is dilated and does not collapse.  There is some balance in the septum which can indicate concern for tamponade or constriction.  Clinically, he does not seem to have either of these.  Recommend continue to monitor for now.  Diuresis  was increased yesterday as above.  Recommend repeating echo in 1 week.  If he needs additional intervention he would need a pericardial window.  With the areas of likely coagulated blood this would not drain with repeat pericardiocentesis.  # CKD 3: Renal function stable.   DM: Per primary team  # HTN:  Currently on clonidine 0.3 mg daily and no other antihypertensives.  Blood pressure seems to be stable on a somewhat odd regimen.  Will need to continue to monitor.  At home he was on amlodipine, clonidine, lisinopril, and metoprolol.  # PAD:  Continue simvastatin.  # Pancytopenia: Per heme-onc and primary team.   For questions or updates, please contact [facility] Please consult [email or web address] for contact info under        Signed, Chilton Si, MD

## 2022-09-04 DIAGNOSIS — I3139 Other pericardial effusion (noninflammatory): Secondary | ICD-10-CM | POA: Diagnosis not present

## 2022-09-04 DIAGNOSIS — K921 Melena: Secondary | ICD-10-CM | POA: Diagnosis not present

## 2022-09-04 LAB — CBC
HCT: 27.7 % — ABNORMAL LOW (ref 39.0–52.0)
Hemoglobin: 8.5 g/dL — ABNORMAL LOW (ref 13.0–17.0)
MCH: 26.8 pg (ref 26.0–34.0)
MCHC: 30.7 g/dL (ref 30.0–36.0)
MCV: 87.4 fL (ref 80.0–100.0)
Platelets: 131 10*3/uL — ABNORMAL LOW (ref 150–400)
RBC: 3.17 MIL/uL — ABNORMAL LOW (ref 4.22–5.81)
RDW: 16.2 % — ABNORMAL HIGH (ref 11.5–15.5)
WBC: 3.4 10*3/uL — ABNORMAL LOW (ref 4.0–10.5)
nRBC: 0.6 % — ABNORMAL HIGH (ref 0.0–0.2)

## 2022-09-04 LAB — BASIC METABOLIC PANEL
Anion gap: 10 (ref 5–15)
BUN: 26 mg/dL — ABNORMAL HIGH (ref 8–23)
CO2: 28 mmol/L (ref 22–32)
Calcium: 8.6 mg/dL — ABNORMAL LOW (ref 8.9–10.3)
Chloride: 98 mmol/L (ref 98–111)
Creatinine, Ser: 1.67 mg/dL — ABNORMAL HIGH (ref 0.61–1.24)
GFR, Estimated: 42 mL/min — ABNORMAL LOW (ref >60)
Glucose, Bld: 109 mg/dL — ABNORMAL HIGH (ref 70–99)
Potassium: 3.6 mmol/L (ref 3.5–5.1)
Sodium: 136 mmol/L (ref 135–145)

## 2022-09-04 LAB — GLUCOSE, CAPILLARY
Glucose-Capillary: 102 mg/dL — ABNORMAL HIGH (ref 70–99)
Glucose-Capillary: 146 mg/dL — ABNORMAL HIGH (ref 70–99)
Glucose-Capillary: 163 mg/dL — ABNORMAL HIGH (ref 70–99)
Glucose-Capillary: 150 mg/dL — ABNORMAL HIGH (ref 70–99)

## 2022-09-04 NOTE — Plan of Care (Signed)
  Problem: Education: Goal: Ability to describe self-care measures that may prevent or decrease complications (Diabetes Survival Skills Education) will improve Outcome: Progressing Goal: Individualized Educational Video(s) Outcome: Progressing   Problem: Coping: Goal: Ability to adjust to condition or change in health will improve Outcome: Progressing   Problem: Fluid Volume: Goal: Ability to maintain a balanced intake and output will improve Outcome: Progressing   Problem: Health Behavior/Discharge Planning: Goal: Ability to identify and utilize available resources and services will improve Outcome: Progressing Goal: Ability to manage health-related needs will improve Outcome: Progressing   Problem: Metabolic: Goal: Ability to maintain appropriate glucose levels will improve Outcome: Progressing   Problem: Nutritional: Goal: Maintenance of adequate nutrition will improve Outcome: Progressing Goal: Progress toward achieving an optimal weight will improve Outcome: Progressing   Problem: Skin Integrity: Goal: Risk for impaired skin integrity will decrease Outcome: Progressing   Problem: Tissue Perfusion: Goal: Adequacy of tissue perfusion will improve Outcome: Progressing   Problem: Education: Goal: Knowledge of General Education information will improve Description: Including pain rating scale, medication(s)/side effects and non-pharmacologic comfort measures Outcome: Progressing   Problem: Health Behavior/Discharge Planning: Goal: Ability to manage health-related needs will improve Outcome: Progressing   Problem: Clinical Measurements: Goal: Ability to maintain clinical measurements within normal limits will improve Outcome: Progressing Goal: Will remain free from infection Outcome: Progressing Goal: Diagnostic test results will improve Outcome: Progressing Goal: Respiratory complications will improve Outcome: Progressing Goal: Cardiovascular complication will  be avoided Outcome: Progressing   Problem: Activity: Goal: Risk for activity intolerance will decrease Outcome: Progressing   Problem: Nutrition: Goal: Adequate nutrition will be maintained Outcome: Progressing   Problem: Coping: Goal: Level of anxiety will decrease Outcome: Progressing   Problem: Elimination: Goal: Will not experience complications related to bowel motility Outcome: Progressing Goal: Will not experience complications related to urinary retention Outcome: Progressing   Problem: Pain Managment: Goal: General experience of comfort will improve Outcome: Progressing   Problem: Safety: Goal: Ability to remain free from injury will improve Outcome: Progressing   Problem: Skin Integrity: Goal: Risk for impaired skin integrity will decrease Outcome: Progressing   Problem: Education: Goal: Ability to identify signs and symptoms of gastrointestinal bleeding will improve Outcome: Progressing   Problem: Bowel/Gastric: Goal: Will show no signs and symptoms of gastrointestinal bleeding Outcome: Progressing   Problem: Fluid Volume: Goal: Will show no signs and symptoms of excessive bleeding Outcome: Progressing   Problem: Clinical Measurements: Goal: Complications related to the disease process, condition or treatment will be avoided or minimized Outcome: Progressing   Problem: Education: Goal: Understanding of CV disease, CV risk reduction, and recovery process will improve Outcome: Progressing Goal: Individualized Educational Video(s) Outcome: Progressing   Problem: Activity: Goal: Ability to return to baseline activity level will improve Outcome: Progressing   Problem: Cardiovascular: Goal: Ability to achieve and maintain adequate cardiovascular perfusion will improve Outcome: Progressing Goal: Vascular access site(s) Level 0-1 will be maintained Outcome: Progressing   Problem: Health Behavior/Discharge Planning: Goal: Ability to safely manage  health-related needs after discharge will improve Outcome: Progressing   
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
## 2022-09-04 NOTE — Care Management Important Message (Signed)
Important Message  Patient Details  Name: Robert [name] MRN: [id number] Date of Birth: Jul 08, 1945   Medicare Important Message Given:  Yes     Robert [name] 09/04/2022, 11:25 AM

## 2022-09-04 NOTE — Progress Notes (Signed)
PT Cancellation Note  Patient Details Name: Robert [name] MRN: [id number] DOB: June 13, 1945   Cancelled Treatment:    Reason Eval/Treat Not Completed: Patient declined, no reason specified Patient fatigued after OT session that morning. Requested PT come back in the afternoon to walk. Will re attempt later.  Malachi Carl, SPT   Malachi Carl 09/04/2022, 12:34 PM
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
## 2022-09-04 NOTE — Progress Notes (Signed)
   09/04/22 0827  Assess: MEWS Score  Temp 97.9 F (36.6 C)  BP 128/83  MAP (mmHg) 97  Pulse Rate (!) 113  Resp 20  SpO2 99 %  O2 Device Nasal Cannula  Assess: MEWS Score  MEWS Temp 0  MEWS Systolic 0  MEWS Pulse 2  MEWS RR 0  MEWS LOC 0  MEWS Score 2  MEWS Score Color Yellow  Assess: if the MEWS score is Yellow or Red  Were vital signs taken at a resting state? Yes  Focused Assessment No change from prior assessment  Does the patient meet 2 or more of the SIRS criteria? No  MEWS guidelines implemented  No, other (Comment)  Assess: SIRS CRITERIA  SIRS Temperature  0  SIRS Pulse 1  SIRS Respirations  0  SIRS WBC 0  SIRS Score Sum  1

## 2022-09-04 NOTE — Progress Notes (Signed)
Patient Name: Robert [name] Date of Encounter: 09/04/2022 [facility] Cardiologist: None   Interval Summary  .    No bleeding. Denies CP or SOB. Hgb stable at 8.5  Getting lasix 2x/day. Weights unreliable. Renal function stable  Feels weak.   Vital Signs .    Vitals:   09/03/22 2015 09/04/22 0402 09/04/22 0417 09/04/22 0827  BP: 105/74  105/72 128/83  Pulse: 87  (!) 108 (!) 113  Resp: 16  17 20   Temp: (!) 97.4 F (36.3 C)  98.4 F (36.9 C) 97.9 F (36.6 C)  TempSrc:      SpO2: 99%  100% 99%  Weight:  (!) 141 kg    Height:        Intake/Output Summary (Last 24 hours) at 09/04/2022 0834 Last data filed at 09/03/2022 1100 Gross per 24 hour  Intake --  Output 1325 ml  Net -1325 ml      09/04/2022    4:02 AM 09/02/2022    7:40 AM 08/31/2022    5:00 AM  Last 3 Weights  Weight (lbs) 310 lb 13.6 oz 275 lb 12.7 oz 292 lb 5.3 oz  Weight (kg) 141 kg 125.1 kg 132.6 kg     Scheduled Meds:  Chlorhexidine Gluconate Cloth  6 each Topical Daily   cloNIDine  0.3 mg Oral Daily   dapagliflozin propanediol  10 mg Oral Daily   docusate sodium  100 mg Oral BID   DULoxetine  60 mg Oral BID   fesoterodine  4 mg Oral Daily   furosemide  40 mg Oral BID   guaiFENesin  600 mg Oral BID   insulin aspart  0-15 Units Subcutaneous TID WC   insulin aspart  0-5 Units Subcutaneous QHS   insulin glargine-yfgn  25 Units Subcutaneous Daily   metoprolol succinate  100 mg Oral Daily   pantoprazole  40 mg Oral BID   polyethylene glycol  17 g Oral BID   potassium chloride  40 mEq Oral Daily   simvastatin  20 mg Oral q1800   Continuous Infusions: PRN Meds:.acetaminophen **OR** acetaminophen, bisacodyl, chlorpheniramine-HYDROcodone, fluticasone, ipratropium-albuterol, methocarbamol, ondansetron (ZOFRAN) IV, traMADol, traZODone  Telemetry/ECG    Sinus rhythm.  No events.- Personally Reviewed  Echo 09/02/2022: IMPRESSIONS    1. Left ventricular ejection fraction, by estimation,  is 55 to 60%. The  left ventricle has normal function. There is mild concentric left  ventricular hypertrophy.   2. Small to moderate pericardial effusion remains measuring 1.0 to 1.2 cm  in most regions. Largest collection is adjacent to the right ventricle  measuring up to 2.1 cm. There appear to be some regions of loculation.  Significant improvement compared with   prior to pericardiocentesis. Stable to slightly increased compared with  the echo 08/30/22. IVC remains dilated and does not collapse. Mild septal  bounce noted. Raises concern for ventricular interdependence related to  either tamponade or constriction.   3. The inferior vena cava is dilated in size with <50% respiratory  variability, suggesting right atrial pressure of 15 mmHg.   Physical Exam .    VS:  BP 128/83 (BP Location: Right Wrist)   Pulse (!) 113   Temp 97.9 F (36.6 C)   Resp 20   Ht 6\' 2"  (1.88 m)   Wt (!) 141 kg   SpO2 99%   BMI 39.91 kg/m  , BMI Body mass index is 39.91 kg/m. General:  Weak appearing. No resp difficulty HEENT: normal Neck: supple. Hard  to see JVD. Carotids 2+ bilat; no bruits. No lymphadenopathy or thryomegaly appreciated. Cor: PMI nondisplaced. Regular rate & rhythm. No rubs, gallops or murmurs. Lungs: clear Abdomen: obese soft, nontender, nondistended. No hepatosplenomegaly. No bruits or masses. Good bowel sounds. Extremities: no cyanosis, clubbing, rash, edema Neuro: alert & orientedx3, cranial nerves grossly intact. moves all 4 extremities w/o difficulty. Affect pleasant  Assessment & Plan .     # GI bleed: # Colon mass-adenocarcinoma: - Known sigmoid colon mass.  Biopsies consistent with adenocarcinoma.  He will start treatment after he gets stronger in therapy. - hgb stable  # Acute on chronic diastolic HF:  - Persistent pericardial effusion on repeat echo.  Lasix increased to twice daily.  Renal function remained stable.  Continue with diuresis. - continue Farxiga  #  Pericardial effusion: -Patient initially had a moderate pericardial effusion with signs concerning for tamponade.  He underwent pericardiocentesis 08/30/2022.  Fluid was exudative but no malignant cells on cytology. Pericardial drain was removed.  Repeat echo 7/13 showed persistent mild pericardial effusion slightly increased from prior to removing the drain.  There also appear to be some loculated areas.  IVC is still 15 mmHg indicating that is dilated and does not collapse.  There is some balance in the septum which can indicate concern for constriction.  Clinically, he does not seem to have either of these.  Recommend continue to monitor for now.   - I reviewed echo today. Effusion is fairly small recommend repeat echo in 4 weeks. May have component of constriction. Will check sed rate. If significantly elevated can start colchicine  # CKD 3b: Renal function stable.   DM: Per primary team  # HTN:  -improved  # PAD:  - stable Continue simvastatin.  # Pancytopenia: Per heme-onc and primary team.  Stable for d/c from a cardiac perspective. We will follow at a distance. Please call with questions. Watch for overdiuresis. Repeat echo in 4 weeks.    For questions or updates, please contact [facility] Please consult [email or web address] for contact info under        Signed, Arvilla Meres, MD

## 2022-09-05 DIAGNOSIS — K921 Melena: Secondary | ICD-10-CM | POA: Diagnosis not present

## 2022-09-05 LAB — BASIC METABOLIC PANEL
Anion gap: 9 (ref 5–15)
BUN: 29 mg/dL — ABNORMAL HIGH (ref 8–23)
CO2: 29 mmol/L (ref 22–32)
Calcium: 8.6 mg/dL — ABNORMAL LOW (ref 8.9–10.3)
Chloride: 98 mmol/L (ref 98–111)
Creatinine, Ser: 1.67 mg/dL — ABNORMAL HIGH (ref 0.61–1.24)
GFR, Estimated: 42 mL/min — ABNORMAL LOW (ref >60)
Glucose, Bld: 111 mg/dL — ABNORMAL HIGH (ref 70–99)
Potassium: 3.7 mmol/L (ref 3.5–5.1)
Sodium: 136 mmol/L (ref 135–145)

## 2022-09-05 LAB — CBC
HCT: 26.1 % — ABNORMAL LOW (ref 39.0–52.0)
Hemoglobin: 8.2 g/dL — ABNORMAL LOW (ref 13.0–17.0)
MCH: 26.7 pg (ref 26.0–34.0)
MCHC: 31.4 g/dL (ref 30.0–36.0)
MCV: 85 fL (ref 80.0–100.0)
Platelets: 128 10*3/uL — ABNORMAL LOW (ref 150–400)
RBC: 3.07 MIL/uL — ABNORMAL LOW (ref 4.22–5.81)
RDW: 16.4 % — ABNORMAL HIGH (ref 11.5–15.5)
WBC: 3.6 10*3/uL — ABNORMAL LOW (ref 4.0–10.5)
nRBC: 0.6 % — ABNORMAL HIGH (ref 0.0–0.2)

## 2022-09-05 LAB — GLUCOSE, CAPILLARY
Glucose-Capillary: 115 mg/dL — ABNORMAL HIGH (ref 70–99)
Glucose-Capillary: 122 mg/dL — ABNORMAL HIGH (ref 70–99)
Glucose-Capillary: 166 mg/dL — ABNORMAL HIGH (ref 70–99)
Glucose-Capillary: 126 mg/dL — ABNORMAL HIGH (ref 70–99)
Glucose-Capillary: 120 mg/dL — ABNORMAL HIGH (ref 70–99)

## 2022-09-05 LAB — SEDIMENTATION RATE: Sed Rate: 106 mm/hr — ABNORMAL HIGH (ref 0–20)

## 2022-09-05 LAB — PHOSPHORUS: Phosphorus: 4.6 mg/dL (ref 2.5–4.6)

## 2022-09-05 LAB — MAGNESIUM: Magnesium: 2.4 mg/dL (ref 1.7–2.4)

## 2022-09-05 LAB — URIC ACID: Uric Acid, Serum: 7.2 mg/dL (ref 3.7–8.6)

## 2022-09-05 NOTE — Plan of Care (Signed)
  Problem: Education: Goal: Ability to describe self-care measures that may prevent or decrease complications (Diabetes Survival Skills Education) will improve Outcome: Progressing   Problem: Coping: Goal: Ability to adjust to condition or change in health will improve Outcome: Progressing   
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
## 2022-09-05 NOTE — Plan of Care (Signed)
  Problem: Education: Goal: Ability to describe self-care measures that may prevent or decrease complications (Diabetes Survival Skills Education) will improve Outcome: Progressing   Problem: Coping: Goal: Ability to adjust to condition or change in health will improve Outcome: Progressing   Problem: Fluid Volume: Goal: Ability to maintain a balanced intake and output will improve Outcome: Progressing   Problem: Health Behavior/Discharge Planning: Goal: Ability to manage health-related needs will improve Outcome: Progressing   Problem: Metabolic: Goal: Ability to maintain appropriate glucose levels will improve Outcome: Progressing   Problem: Nutritional: Goal: Maintenance of adequate nutrition will improve Outcome: Progressing Goal: Progress toward achieving an optimal weight will improve Outcome: Progressing
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
## 2022-09-06 DIAGNOSIS — K921 Melena: Secondary | ICD-10-CM | POA: Diagnosis not present

## 2022-09-06 LAB — CBC
HCT: 26.9 % — ABNORMAL LOW (ref 39.0–52.0)
Hemoglobin: 8.1 g/dL — ABNORMAL LOW (ref 13.0–17.0)
MCH: 26.6 pg (ref 26.0–34.0)
MCHC: 30.1 g/dL (ref 30.0–36.0)
MCV: 88.5 fL (ref 80.0–100.0)
Platelets: 121 10*3/uL — ABNORMAL LOW (ref 150–400)
RBC: 3.04 MIL/uL — ABNORMAL LOW (ref 4.22–5.81)
RDW: 16.4 % — ABNORMAL HIGH (ref 11.5–15.5)
WBC: 3.3 10*3/uL — ABNORMAL LOW (ref 4.0–10.5)
nRBC: 0.6 % — ABNORMAL HIGH (ref 0.0–0.2)

## 2022-09-06 LAB — PHOSPHORUS: Phosphorus: 4.6 mg/dL (ref 2.5–4.6)

## 2022-09-06 LAB — BASIC METABOLIC PANEL
Anion gap: 9 (ref 5–15)
BUN: 30 mg/dL — ABNORMAL HIGH (ref 8–23)
CO2: 30 mmol/L (ref 22–32)
Calcium: 8.5 mg/dL — ABNORMAL LOW (ref 8.9–10.3)
Chloride: 98 mmol/L (ref 98–111)
Creatinine, Ser: 1.74 mg/dL — ABNORMAL HIGH (ref 0.61–1.24)
GFR, Estimated: 40 mL/min — ABNORMAL LOW (ref >60)
Glucose, Bld: 107 mg/dL — ABNORMAL HIGH (ref 70–99)
Potassium: 3.7 mmol/L (ref 3.5–5.1)
Sodium: 137 mmol/L (ref 135–145)

## 2022-09-06 LAB — GLUCOSE, CAPILLARY
Glucose-Capillary: 122 mg/dL — ABNORMAL HIGH (ref 70–99)
Glucose-Capillary: 115 mg/dL — ABNORMAL HIGH (ref 70–99)
Glucose-Capillary: 140 mg/dL — ABNORMAL HIGH (ref 70–99)
Glucose-Capillary: 94 mg/dL (ref 70–99)

## 2022-09-06 LAB — MAGNESIUM: Magnesium: 2.3 mg/dL (ref 1.7–2.4)

## 2022-09-06 MED ORDER — COLCHICINE 0.6 MG PO TABS
0.6000 mg | ORAL_TABLET | Freq: Two times a day (BID) | ORAL | Status: DC
  Administered 2022-09-06 – 2022-09-12 (×13): 0.6 mg via ORAL
  Filled 2022-09-06 (×13): qty 1

## 2022-09-06 NOTE — Plan of Care (Signed)
  Problem: Fluid Volume: Goal: Ability to maintain a balanced intake and output will improve Outcome: Progressing   Problem: Clinical Measurements: Goal: Respiratory complications will improve Outcome: Progressing Goal: Cardiovascular complication will be avoided Outcome: Progressing   Problem: Activity: Goal: Risk for activity intolerance will decrease Outcome: Not Progressing   Problem: Pain Managment: Goal: General experience of comfort will improve Outcome: Progressing
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
## 2022-09-06 NOTE — Progress Notes (Signed)
attempted to transfer patient from bed to chair, he was able to stand but unable to propel self forward. he sat on edge of bed for about 1 hour, bp dropped to 84/68. fyi.. bp now 123/82
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
## 2022-09-07 DIAGNOSIS — K921 Melena: Secondary | ICD-10-CM | POA: Diagnosis not present

## 2022-09-07 LAB — MAGNESIUM: Magnesium: 2.4 mg/dL (ref 1.7–2.4)

## 2022-09-07 LAB — CBC
HCT: 27.6 % — ABNORMAL LOW (ref 39.0–52.0)
Hemoglobin: 8.4 g/dL — ABNORMAL LOW (ref 13.0–17.0)
MCH: 26.9 pg (ref 26.0–34.0)
MCHC: 30.4 g/dL (ref 30.0–36.0)
MCV: 88.5 fL (ref 80.0–100.0)
Platelets: 115 10*3/uL — ABNORMAL LOW (ref 150–400)
RBC: 3.12 MIL/uL — ABNORMAL LOW (ref 4.22–5.81)
RDW: 16.6 % — ABNORMAL HIGH (ref 11.5–15.5)
WBC: 3.4 10*3/uL — ABNORMAL LOW (ref 4.0–10.5)
nRBC: 0.6 % — ABNORMAL HIGH (ref 0.0–0.2)

## 2022-09-07 LAB — GLUCOSE, CAPILLARY
Glucose-Capillary: 106 mg/dL — ABNORMAL HIGH (ref 70–99)
Glucose-Capillary: 129 mg/dL — ABNORMAL HIGH (ref 70–99)
Glucose-Capillary: 100 mg/dL — ABNORMAL HIGH (ref 70–99)
Glucose-Capillary: 132 mg/dL — ABNORMAL HIGH (ref 70–99)

## 2022-09-07 LAB — BASIC METABOLIC PANEL
Anion gap: 10 (ref 5–15)
BUN: 28 mg/dL — ABNORMAL HIGH (ref 8–23)
CO2: 30 mmol/L (ref 22–32)
Calcium: 8.7 mg/dL — ABNORMAL LOW (ref 8.9–10.3)
Chloride: 99 mmol/L (ref 98–111)
Creatinine, Ser: 1.68 mg/dL — ABNORMAL HIGH (ref 0.61–1.24)
GFR, Estimated: 42 mL/min — ABNORMAL LOW (ref >60)
Glucose, Bld: 110 mg/dL — ABNORMAL HIGH (ref 70–99)
Potassium: 4.4 mmol/L (ref 3.5–5.1)
Sodium: 139 mmol/L (ref 135–145)

## 2022-09-07 LAB — PHOSPHORUS: Phosphorus: 4.7 mg/dL — ABNORMAL HIGH (ref 2.5–4.6)

## 2022-09-07 MED ORDER — FUROSEMIDE 40 MG PO TABS
40.0000 mg | ORAL_TABLET | Freq: Every day | ORAL | Status: DC
  Administered 2022-09-11 – 2022-09-12 (×2): 40 mg via ORAL
  Filled 2022-09-07 (×2): qty 1

## 2022-09-07 MED ORDER — SODIUM CHLORIDE 0.9 % IV BOLUS
1000.0000 mL | Freq: Once | INTRAVENOUS | Status: AC
  Administered 2022-09-07: 1000 mL via INTRAVENOUS
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
## 2022-09-07 NOTE — Plan of Care (Signed)
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
## 2022-09-07 NOTE — Care Management Important Message (Signed)
Important Message  Patient Details  Name: Robert [name] MRN: [id number] Date of Birth: 04-Apr-1945   Medicare Important Message Given:  Yes  Patient asleep upon time of visit, no family in room.  Copy of Medicare IM left in room on counter for reference.   [name] 09/07/2022, 2:27 PM

## 2022-09-08 ENCOUNTER — Inpatient Hospital Stay: Payer: Medicare Other

## 2022-09-08 ENCOUNTER — Inpatient Hospital Stay (HOSPITAL_COMMUNITY)
Admit: 2022-09-08 | Discharge: 2022-09-08 | Disposition: A | Discharge status: Home / Self Care | Payer: Medicare Other | Attending: Internal Medicine | Admitting: Internal Medicine

## 2022-09-08 DIAGNOSIS — I3139 Other pericardial effusion (noninflammatory): Secondary | ICD-10-CM | POA: Diagnosis not present

## 2022-09-08 DIAGNOSIS — K921 Melena: Secondary | ICD-10-CM | POA: Diagnosis not present

## 2022-09-08 DIAGNOSIS — I5033 Acute on chronic diastolic (congestive) heart failure: Secondary | ICD-10-CM | POA: Diagnosis not present

## 2022-09-08 LAB — CBC
HCT: 27.7 % — ABNORMAL LOW (ref 39.0–52.0)
Hemoglobin: 8.4 g/dL — ABNORMAL LOW (ref 13.0–17.0)
MCH: 26.3 pg (ref 26.0–34.0)
MCHC: 30.3 g/dL (ref 30.0–36.0)
MCV: 86.8 fL (ref 80.0–100.0)
Platelets: 114 10*3/uL — ABNORMAL LOW (ref 150–400)
RBC: 3.19 MIL/uL — ABNORMAL LOW (ref 4.22–5.81)
RDW: 16.6 % — ABNORMAL HIGH (ref 11.5–15.5)
WBC: 2.8 10*3/uL — ABNORMAL LOW (ref 4.0–10.5)
nRBC: 0.7 % — ABNORMAL HIGH (ref 0.0–0.2)

## 2022-09-08 LAB — BASIC METABOLIC PANEL
Anion gap: 12 (ref 5–15)
BUN: 26 mg/dL — ABNORMAL HIGH (ref 8–23)
CO2: 26 mmol/L (ref 22–32)
Calcium: 8.8 mg/dL — ABNORMAL LOW (ref 8.9–10.3)
Chloride: 100 mmol/L (ref 98–111)
Creatinine, Ser: 1.53 mg/dL — ABNORMAL HIGH (ref 0.61–1.24)
GFR, Estimated: 47 mL/min — ABNORMAL LOW (ref >60)
Glucose, Bld: 148 mg/dL — ABNORMAL HIGH (ref 70–99)
Potassium: 4 mmol/L (ref 3.5–5.1)
Sodium: 138 mmol/L (ref 135–145)

## 2022-09-08 LAB — ECHOCARDIOGRAM LIMITED
Est EF: 55
Height: 74 in
S' Lateral: 2.8 cm
Weight: 5051.18 oz

## 2022-09-08 LAB — GLUCOSE, CAPILLARY
Glucose-Capillary: 102 mg/dL — ABNORMAL HIGH (ref 70–99)
Glucose-Capillary: 163 mg/dL — ABNORMAL HIGH (ref 70–99)
Glucose-Capillary: 144 mg/dL — ABNORMAL HIGH (ref 70–99)
Glucose-Capillary: 143 mg/dL — ABNORMAL HIGH (ref 70–99)
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
## 2022-09-08 NOTE — Progress Notes (Signed)
On routine rounding in the Unit, this [place] visited the Pt at bedside. Pt reviewed his medical status and projections as recommended by the doctors. Mirna Mires brought in reflective listening as well as compassionate presence.   09/08/22 1400  Spiritual Encounters  Type of Visit Initial  Care provided to: Patient  Referral source Chaplain assessment  Reason for visit Routine spiritual support  OnCall Visit Yes  Spiritual Framework  Presenting Themes Significant life change;Courage hope and growth  Interventions  Spiritual Care Interventions Made Compassionate presence;Reflective listening  Intervention Outcomes  Outcomes Awareness of support;Connection to spiritual care  Spiritual Care Plan  Spiritual Care Issues Still Outstanding No further spiritual care needs at this time (see row info)

## 2022-09-08 NOTE — Plan of Care (Signed)
CBG 102 and has ordered LA insulin 25U this AM. Hosp informed to ask if ok to give??  Per patient CBGs in morning normally in the 120s.  Dr. Jovita Gamma verbal to hold LA insulin for this morning and no Novolog to be given either, per parameters.

## 2022-09-08 NOTE — TOC Progression Note (Addendum)
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   Tammy reports weekend worker is to call Kaleen Odea at [phone] or Charlcie Cradle at [phone]        Expected Discharge Plan and Services                                               Social Determinants of Health (SDOH) Interventions SDOH Screenings   Food Insecurity: No Food Insecurity (08/19/2022)  Housing: Low Risk  (08/19/2022)  Transportation Needs: No Transportation Needs (08/19/2022)  Utilities: Not At Risk (08/19/2022)  Alcohol Screen: Low Risk  (10/29/2020)  Depression (PHQ2-9): Low Risk  (09/12/2021)  Financial Resource Strain: Low Risk  (10/29/2020)  Physical Activity: Inactive (10/29/2020)  Social Connections: Moderately Isolated (10/29/2020)  Stress: No Stress Concern Present (10/29/2020)  Tobacco Use: Low Risk  (08/29/2022)   Received from [facility], [facility]    Readmission Risk Interventions     No data to display

## 2022-09-08 NOTE — Progress Notes (Signed)
Patient Name: Robert [name] Date of Encounter: 09/08/2022 [facility] Cardiologist: None   Interval Summary  .    Was orthostatic yesterday. Unable to work with PT  Given 1L IVF and lasix/clonidine held  Feels much better today. SBP ~120   Echo done this am with trivial pericardial effusion   Vital Signs .    Vitals:   09/07/22 2020 09/07/22 2323 09/08/22 0342 09/08/22 0750  BP: 99/68 113/73 119/75 118/81  Pulse: (!) 104 85 100 82  Resp: 16 18 18 18   Temp: 98.1 F (36.7 C) (!) 97.5 F (36.4 C) (!) 97.4 F (36.3 C) 98.4 F (36.9 C)  TempSrc:   Oral Oral  SpO2: 98% 100% 100% 100%  Weight:   (!) 143.2 kg   Height:        Intake/Output Summary (Last 24 hours) at 09/08/2022 0852 Last data filed at 09/08/2022 0345 Gross per 24 hour  Intake 380 ml  Output 1075 ml  Net -695 ml      09/08/2022    3:42 AM 09/07/2022    3:37 AM 09/06/2022    3:50 AM  Last 3 Weights  Weight (lbs) 315 lb 11.2 oz 327 lb 13.2 oz 319 lb 7.1 oz  Weight (kg) 143.2 kg 148.7 kg 144.9 kg     Scheduled Meds:  Chlorhexidine Gluconate Cloth  6 each Topical Daily   colchicine  0.6 mg Oral BID   dapagliflozin propanediol  10 mg Oral Daily   docusate sodium  100 mg Oral BID   DULoxetine  60 mg Oral BID   fesoterodine  4 mg Oral Daily   [START ON 09/11/2022] furosemide  40 mg Oral Daily   guaiFENesin  600 mg Oral BID   insulin aspart  0-15 Units Subcutaneous TID WC   insulin aspart  0-5 Units Subcutaneous QHS   insulin glargine-yfgn  25 Units Subcutaneous Daily   metoprolol succinate  100 mg Oral Daily   pantoprazole  40 mg Oral BID   polyethylene glycol  17 g Oral BID   potassium chloride  40 mEq Oral Daily   simvastatin  20 mg Oral q1800   Continuous Infusions: PRN Meds:.acetaminophen **OR** acetaminophen, bisacodyl, chlorpheniramine-HYDROcodone, fluticasone, ipratropium-albuterol, methocarbamol, ondansetron (ZOFRAN) IV, traMADol, traZODone  Telemetry/ECG    Sinus rhythm.  No  events.- Personally Reviewed  Echo 09/02/2022: IMPRESSIONS    1. Left ventricular ejection fraction, by estimation, is 55 to 60%. The  left ventricle has normal function. There is mild concentric left  ventricular hypertrophy.   2. Small to moderate pericardial effusion remains measuring 1.0 to 1.2 cm  in most regions. Largest collection is adjacent to the right ventricle  measuring up to 2.1 cm. There appear to be some regions of loculation.  Significant improvement compared with   prior to pericardiocentesis. Stable to slightly increased compared with  the echo 08/30/22. IVC remains dilated and does not collapse. Mild septal  bounce noted. Raises concern for ventricular interdependence related to  either tamponade or constriction.   3. The inferior vena cava is dilated in size with <50% respiratory  variability, suggesting right atrial pressure of 15 mmHg.   Physical Exam .    VS:  BP 118/81 (BP Location: Right Arm)   Pulse 82   Temp 98.4 F (36.9 C) (Oral)   Resp 18   Ht 6\' 2"  (1.88 m)   Wt (!) 143.2 kg   SpO2 100%   BMI 40.53 kg/m  , BMI Body  mass index is 40.53 kg/m. General: Obese male sitting up in bed  No resp difficulty HEENT: normal Neck: supple. no JVD. Carotids 2+ bilat; no bruits. No lymphadenopathy or thryomegaly appreciated. Cor: PMI nondisplaced. Regular rate & rhythm. No rubs, gallops or murmurs. Lungs: clear Abdomen: soft, nontender, nondistended. No hepatosplenomegaly. No bruits or masses. Good bowel sounds. Extremities: no cyanosis, clubbing, rash, edema Neuro: alert & orientedx3, cranial nerves grossly intact. moves all 4 extremities w/o difficulty. Affect pleasant   Assessment & Plan .     # GI bleed: # Colon mass-adenocarcinoma: - Known sigmoid colon mass.  Biopsies consistent with adenocarcinoma.  He will start treatment after he gets stronger in therapy. - stable. Per primary  # Acute on chronic diastolic HF:  - he was overdiuresed. Now  improved with IVF and holding lasix - can restart lasix at 40 daily on Sunday (he was on PTA)  # Pericardial effusion: -Patient initially had a moderate pericardial effusion with signs concerning for tamponade.  He underwent pericardiocentesis 08/30/2022.  Fluid was exudative but no malignant cells on cytology. Pericardial drain was removed.  Repeat echo 7/13 showed persistent mild pericardial effusion slightly increased from prior to removing the drain.  There also appear to be some loculated areas.  IVC is still 15 mmHg indicating that is dilated and does not collapse.  There is some balance in the septum which can indicate concern for constriction.  Clinically, he does not seem to have either of these.  Recommend continue to monitor for now.   - stat echo this am with trivial residual effusion - continue colchicine x 6 months  # CKD 3b: Renal function stable. Yesterday - am labs pending   DM: Per primary team  # HTN:  -As above. Hold clonidine for now  # PAD:  - stable Continue simvastatin.  # Pancytopenia: Per heme-onc and primary team.  Stable for d/c from a cardiac perspective. Repeat echo 2-3 months.    For questions or updates, please contact [facility] Please consult [email or web address] for contact info under        Signed, Arvilla Meres, MD
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
## 2022-09-08 NOTE — Progress Notes (Signed)
*  PRELIMINARY RESULTS* Echocardiogram 2D Echocardiogram has been performed.  Robert [name] 09/08/2022, 8:26 AM

## 2022-09-09 DIAGNOSIS — K921 Melena: Secondary | ICD-10-CM | POA: Diagnosis not present

## 2022-09-09 LAB — GLUCOSE, CAPILLARY
Glucose-Capillary: 114 mg/dL — ABNORMAL HIGH (ref 70–99)
Glucose-Capillary: 138 mg/dL — ABNORMAL HIGH (ref 70–99)
Glucose-Capillary: 136 mg/dL — ABNORMAL HIGH (ref 70–99)
Glucose-Capillary: 139 mg/dL — ABNORMAL HIGH (ref 70–99)
Glucose-Capillary: 137 mg/dL — ABNORMAL HIGH (ref 70–99)

## 2022-09-09 LAB — BASIC METABOLIC PANEL
Anion gap: 7 (ref 5–15)
BUN: 28 mg/dL — ABNORMAL HIGH (ref 8–23)
CO2: 29 mmol/L (ref 22–32)
Calcium: 8.9 mg/dL (ref 8.9–10.3)
Chloride: 102 mmol/L (ref 98–111)
Creatinine, Ser: 1.71 mg/dL — ABNORMAL HIGH (ref 0.61–1.24)
GFR, Estimated: 41 mL/min — ABNORMAL LOW (ref >60)
Glucose, Bld: 118 mg/dL — ABNORMAL HIGH (ref 70–99)
Potassium: 4.8 mmol/L (ref 3.5–5.1)
Sodium: 138 mmol/L (ref 135–145)

## 2022-09-09 LAB — CBC
HCT: 27.6 % — ABNORMAL LOW (ref 39.0–52.0)
Hemoglobin: 8.5 g/dL — ABNORMAL LOW (ref 13.0–17.0)
MCH: 27 pg (ref 26.0–34.0)
MCHC: 30.8 g/dL (ref 30.0–36.0)
MCV: 87.6 fL (ref 80.0–100.0)
Platelets: 106 10*3/uL — ABNORMAL LOW (ref 150–400)
RBC: 3.15 MIL/uL — ABNORMAL LOW (ref 4.22–5.81)
RDW: 16.7 % — ABNORMAL HIGH (ref 11.5–15.5)
WBC: 3.6 10*3/uL — ABNORMAL LOW (ref 4.0–10.5)
nRBC: 0 % (ref 0.0–0.2)

## 2022-09-09 MED ORDER — MIDODRINE HCL 5 MG PO TABS
10.0000 mg | ORAL_TABLET | Freq: Two times a day (BID) | ORAL | Status: AC
  Administered 2022-09-09 (×2): 10 mg via ORAL
  Filled 2022-09-09 (×2): qty 2

## 2022-09-09 MED ORDER — MENTHOL 3 MG MT LOZG
1.0000 | LOZENGE | Freq: Once | OROMUCOSAL | Status: AC
  Administered 2022-09-09: 3 mg via ORAL
  Filled 2022-09-09: qty 9

## 2022-09-09 MED ORDER — METOPROLOL SUCCINATE ER 50 MG PO TB24
50.0000 mg | ORAL_TABLET | Freq: Every day | ORAL | Status: DC
  Administered 2022-09-10 – 2022-09-12 (×3): 50 mg via ORAL
  Filled 2022-09-09 (×3): qty 1

## 2022-09-09 MED ORDER — MENTHOL 3 MG MT LOZG: 1.0000 | LOZENGE | OROMUCOSAL | Status: DC | PRN
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
## 2022-09-10 DIAGNOSIS — K921 Melena: Secondary | ICD-10-CM | POA: Diagnosis not present

## 2022-09-10 LAB — GLUCOSE, CAPILLARY
Glucose-Capillary: 121 mg/dL — ABNORMAL HIGH (ref 70–99)
Glucose-Capillary: 171 mg/dL — ABNORMAL HIGH (ref 70–99)
Glucose-Capillary: 93 mg/dL (ref 70–99)
Glucose-Capillary: 131 mg/dL — ABNORMAL HIGH (ref 70–99)

## 2022-09-10 MED ORDER — MIDODRINE HCL 5 MG PO TABS: 5.0000 mg | ORAL_TABLET | Freq: Three times a day (TID) | ORAL | Status: DC | PRN

## 2022-09-10 NOTE — Progress Notes (Signed)
Patient stood up and dangled on the edge of the bed, but unable to walk on the hallway, patient said he feels too weak to walk today.
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
## 2022-09-11 DIAGNOSIS — K921 Melena: Secondary | ICD-10-CM | POA: Diagnosis not present

## 2022-09-11 LAB — CBC
HCT: 27 % — ABNORMAL LOW (ref 39.0–52.0)
Hemoglobin: 8.3 g/dL — ABNORMAL LOW (ref 13.0–17.0)
MCH: 26.8 pg (ref 26.0–34.0)
MCHC: 30.7 g/dL (ref 30.0–36.0)
MCV: 87.1 fL (ref 80.0–100.0)
Platelets: 104 10*3/uL — ABNORMAL LOW (ref 150–400)
RBC: 3.1 MIL/uL — ABNORMAL LOW (ref 4.22–5.81)
RDW: 17.1 % — ABNORMAL HIGH (ref 11.5–15.5)
WBC: 3.9 10*3/uL — ABNORMAL LOW (ref 4.0–10.5)
nRBC: 0 % (ref 0.0–0.2)

## 2022-09-11 LAB — MAGNESIUM: Magnesium: 2.6 mg/dL — ABNORMAL HIGH (ref 1.7–2.4)

## 2022-09-11 LAB — BASIC METABOLIC PANEL
Anion gap: 8 (ref 5–15)
BUN: 25 mg/dL — ABNORMAL HIGH (ref 8–23)
CO2: 27 mmol/L (ref 22–32)
Calcium: 9 mg/dL (ref 8.9–10.3)
Chloride: 102 mmol/L (ref 98–111)
Creatinine, Ser: 1.57 mg/dL — ABNORMAL HIGH (ref 0.61–1.24)
GFR, Estimated: 45 mL/min — ABNORMAL LOW (ref >60)
Glucose, Bld: 103 mg/dL — ABNORMAL HIGH (ref 70–99)
Potassium: 4.4 mmol/L (ref 3.5–5.1)
Sodium: 137 mmol/L (ref 135–145)

## 2022-09-11 LAB — GLUCOSE, CAPILLARY
Glucose-Capillary: 108 mg/dL — ABNORMAL HIGH (ref 70–99)
Glucose-Capillary: 181 mg/dL — ABNORMAL HIGH (ref 70–99)
Glucose-Capillary: 138 mg/dL — ABNORMAL HIGH (ref 70–99)
Glucose-Capillary: 119 mg/dL — ABNORMAL HIGH (ref 70–99)

## 2022-09-11 LAB — PHOSPHORUS: Phosphorus: 4.1 mg/dL (ref 2.5–4.6)
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
## 2022-09-11 NOTE — TOC Progression Note (Signed)
Transition of Care [facility]) - Progression Note    Patient Details  Name: Robert [name] MRN: [id number] Date of Birth: [birth year]  [facility] CM/SW Contact  Darolyn Rua, [place] Phone Number: 09/11/2022, 1:34 PM  Clinical Narrative:     Per MD potential discharge to [facility] tomorrow 7/23 admissions informed.        Expected Discharge Plan and Services                                               Social Determinants of Health (SDOH) Interventions SDOH Screenings   Food Insecurity: No Food Insecurity (08/19/2022)  Housing: Low Risk  (08/19/2022)  Transportation Needs: No Transportation Needs (08/19/2022)  Utilities: Not At Risk (08/19/2022)  Alcohol Screen: Low Risk  (10/29/2020)  Depression (PHQ2-9): Low Risk  (09/12/2021)  Financial Resource Strain: Low Risk  (10/29/2020)  Physical Activity: Inactive (10/29/2020)  Social Connections: Moderately Isolated (10/29/2020)  Stress: No Stress Concern Present (10/29/2020)  Tobacco Use: Low Risk  (08/29/2022)   Received from [facility], [facility]    Readmission Risk Interventions     No data to display

## 2022-09-11 NOTE — Care Management Important Message (Signed)
Important Message  Patient Details  Name: Robert [name] MRN: [id number] Date of Birth: [birth year]   Medicare Important Message Given:  Yes     [name] 09/11/2022, 1:28 PM
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
## 2022-09-12 ENCOUNTER — Other Ambulatory Visit: Payer: Self-pay | Admitting: *Deleted

## 2022-09-12 DIAGNOSIS — C187 Malignant neoplasm of sigmoid colon: Secondary | ICD-10-CM

## 2022-09-12 DIAGNOSIS — K921 Melena: Secondary | ICD-10-CM | POA: Diagnosis not present

## 2022-09-12 DIAGNOSIS — D649 Anemia, unspecified: Secondary | ICD-10-CM

## 2022-09-12 LAB — GLUCOSE, CAPILLARY
Glucose-Capillary: 86 mg/dL (ref 70–99)
Glucose-Capillary: 123 mg/dL — ABNORMAL HIGH (ref 70–99)

## 2022-09-12 MED ORDER — POTASSIUM CHLORIDE CRYS ER 20 MEQ PO TBCR: 20.0000 meq | EXTENDED_RELEASE_TABLET | Freq: Every day | ORAL | Status: DC

## 2022-09-12 MED ORDER — METOPROLOL SUCCINATE ER 50 MG PO TB24: 50.0000 mg | ORAL_TABLET | Freq: Every day | ORAL | Status: DC

## 2022-09-12 MED ORDER — PANTOPRAZOLE SODIUM 40 MG PO TBEC: 40.0000 mg | DELAYED_RELEASE_TABLET | Freq: Every day | ORAL | Status: DC

## 2022-09-12 MED ORDER — MIDODRINE HCL 5 MG PO TABS: 5.0000 mg | ORAL_TABLET | Freq: Three times a day (TID) | ORAL | Status: DC | PRN

## 2022-09-12 MED ORDER — TRAZODONE HCL 50 MG PO TABS: 25.0000 mg | ORAL_TABLET | Freq: Every evening | ORAL | Status: DC | PRN

## 2022-09-12 MED ORDER — POLYETHYLENE GLYCOL 3350 17 G PO PACK: 17.0000 g | PACK | Freq: Every day | ORAL | 0 refills | Status: DC

## 2022-09-12 MED ORDER — DAPAGLIFLOZIN PROPANEDIOL 10 MG PO TABS: 10.0000 mg | ORAL_TABLET | Freq: Every day | ORAL | Status: DC

## 2022-09-12 MED ORDER — INSULIN ASPART 100 UNIT/ML IJ SOLN: 0.0000 [IU] | Freq: Three times a day (TID) | INTRAMUSCULAR | Status: DC

## 2022-09-12 MED ORDER — COLCHICINE 0.6 MG PO TABS: 0.6000 mg | ORAL_TABLET | Freq: Two times a day (BID) | ORAL | Status: DC

## 2022-09-12 MED ORDER — LANTUS SOLOSTAR 100 UNIT/ML ~~LOC~~ SOPN: 25.0000 [IU] | PEN_INJECTOR | Freq: Every day | SUBCUTANEOUS | Status: DC

## 2022-09-12 MED ORDER — INSULIN ASPART 100 UNIT/ML IJ SOLN: 0.0000 [IU] | Freq: Every day | INTRAMUSCULAR | Status: DC

## 2022-09-12 NOTE — Progress Notes (Signed)
Report given to receiving facility.

## 2022-09-12 NOTE — Plan of Care (Signed)
  Problem: Education: Goal: Ability to describe self-care measures that may prevent or decrease complications (Diabetes Survival Skills Education) will improve Outcome: Progressing Goal: Individualized Educational Video(s) Outcome: Progressing   Problem: Coping: Goal: Ability to adjust to condition or change in health will improve Outcome: Progressing   Problem: Fluid Volume: Goal: Ability to maintain a balanced intake and output will improve Outcome: Progressing   Problem: Health Behavior/Discharge Planning: Goal: Ability to identify and utilize available resources and services will improve Outcome: Progressing Goal: Ability to manage health-related needs will improve Outcome: Progressing   Problem: Metabolic: Goal: Ability to maintain appropriate glucose levels will improve Outcome: Progressing   Problem: Nutritional: Goal: Maintenance of adequate nutrition will improve Outcome: Progressing Goal: Progress toward achieving an optimal weight will improve Outcome: Progressing   Problem: Skin Integrity: Goal: Risk for impaired skin integrity will decrease Outcome: Progressing   Problem: Tissue Perfusion: Goal: Adequacy of tissue perfusion will improve Outcome: Progressing   Problem: Education: Goal: Knowledge of General Education information will improve Description: Including pain rating scale, medication(s)/side effects and non-pharmacologic comfort measures Outcome: Progressing   Problem: Health Behavior/Discharge Planning: Goal: Ability to manage health-related needs will improve Outcome: Progressing   Problem: Clinical Measurements: Goal: Ability to maintain clinical measurements within normal limits will improve Outcome: Progressing Goal: Will remain free from infection Outcome: Progressing Goal: Diagnostic test results will improve Outcome: Progressing Goal: Respiratory complications will improve Outcome: Progressing Goal: Cardiovascular complication will  be avoided Outcome: Progressing   Problem: Activity: Goal: Risk for activity intolerance will decrease Outcome: Progressing   Problem: Nutrition: Goal: Adequate nutrition will be maintained Outcome: Progressing   Problem: Coping: Goal: Level of anxiety will decrease Outcome: Progressing   Problem: Elimination: Goal: Will not experience complications related to bowel motility Outcome: Progressing Goal: Will not experience complications related to urinary retention Outcome: Progressing   Problem: Pain Managment: Goal: General experience of comfort will improve Outcome: Progressing   Problem: Safety: Goal: Ability to remain free from injury will improve Outcome: Progressing   Problem: Skin Integrity: Goal: Risk for impaired skin integrity will decrease Outcome: Progressing   Problem: Education: Goal: Ability to identify signs and symptoms of gastrointestinal bleeding will improve Outcome: Progressing   Problem: Bowel/Gastric: Goal: Will show no signs and symptoms of gastrointestinal bleeding Outcome: Progressing   Problem: Fluid Volume: Goal: Will show no signs and symptoms of excessive bleeding Outcome: Progressing   Problem: Clinical Measurements: Goal: Complications related to the disease process, condition or treatment will be avoided or minimized Outcome: Progressing   Problem: Education: Goal: Understanding of CV disease, CV risk reduction, and recovery process will improve Outcome: Progressing Goal: Individualized Educational Video(s) Outcome: Progressing   Problem: Activity: Goal: Ability to return to baseline activity level will improve Outcome: Progressing   Problem: Cardiovascular: Goal: Ability to achieve and maintain adequate cardiovascular perfusion will improve Outcome: Progressing Goal: Vascular access site(s) Level 0-1 will be maintained Outcome: Progressing   Problem: Health Behavior/Discharge Planning: Goal: Ability to safely manage  health-related needs after discharge will improve Outcome: Progressing   
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
## 2022-09-12 NOTE — TOC Transition Note (Signed)
Transition of Care [facility]) - CM/SW Discharge Note   Patient Details  Name: Robert [name] MRN: [id number] Date of Birth: 13-Apr-1945  [facility] CM/SW Contact:  Darolyn Rua, LCSW Phone Number: 09/12/2022, 1:29 PM   Clinical Narrative:     Patient will DC OZ:[id number] [facility] Anticipated DC date: 09/12/22 Family notified: spouse [name] Transport ON:GEXBM  Per MD patient ready for DC to [facility] . RN, patient, patient's family, and facility notified of DC. Discharge Summary sent to facility. RN given number for report [phone] ask for 1st floor nursing station, he will be going to Room 103  . DC packet on chart. Ambulance transport requested for patient.  CSW signing off.  Angeline Slim, LCSW    Final next level of care: Skilled Nursing Facility Barriers to Discharge: No Barriers Identified   Patient Goals and CMS Choice CMS Medicare.gov Compare Post Acute Care list provided to:: Patient Choice offered to / list presented to : Patient  Discharge Placement                      Patient and family notified of of transfer: 09/12/22  Discharge Plan and Services Additional resources added to the After Visit Summary for                                       Social Determinants of Health (SDOH) Interventions SDOH Screenings   Food Insecurity: No Food Insecurity (08/19/2022)  Housing: Low Risk  (08/19/2022)  Transportation Needs: No Transportation Needs (08/19/2022)  Utilities: Not At Risk (08/19/2022)  Alcohol Screen: Low Risk  (10/29/2020)  Depression (PHQ2-9): Low Risk  (09/12/2021)  Financial Resource Strain: Low Risk  (10/29/2020)  Physical Activity: Inactive (10/29/2020)  Social Connections: Moderately Isolated (10/29/2020)  Stress: No Stress Concern Present (10/29/2020)  Tobacco Use: Low Risk  (08/29/2022)   Received from [facility], [facility]     Readmission Risk Interventions     No data to  display

## 2022-10-13 ENCOUNTER — Inpatient Hospital Stay: Payer: Medicare Other

## 2022-10-13 ENCOUNTER — Inpatient Hospital Stay: Payer: Medicare Other | Admitting: Oncology

## 2022-10-29 ENCOUNTER — Other Ambulatory Visit: Payer: Self-pay

## 2022-10-29 ENCOUNTER — Inpatient Hospital Stay
Admission: EM | Admit: 2022-10-29 | Discharge: 2022-11-02 | DRG: 690 | Disposition: A | Discharge status: Home Health | Payer: Medicare Other | Attending: Internal Medicine | Admitting: Internal Medicine

## 2022-10-29 DIAGNOSIS — D696 Thrombocytopenia, unspecified: Secondary | ICD-10-CM | POA: Diagnosis present

## 2022-10-29 DIAGNOSIS — N390 Urinary tract infection, site not specified: Principal | ICD-10-CM | POA: Diagnosis present

## 2022-10-29 DIAGNOSIS — E118 Type 2 diabetes mellitus with unspecified complications: Secondary | ICD-10-CM | POA: Diagnosis present

## 2022-10-29 DIAGNOSIS — Z823 Family history of stroke: Secondary | ICD-10-CM

## 2022-10-29 DIAGNOSIS — I5033 Acute on chronic diastolic (congestive) heart failure: Secondary | ICD-10-CM | POA: Diagnosis not present

## 2022-10-29 DIAGNOSIS — S91105A Unspecified open wound of left lesser toe(s) without damage to nail, initial encounter: Secondary | ICD-10-CM | POA: Diagnosis present

## 2022-10-29 DIAGNOSIS — E11649 Type 2 diabetes mellitus with hypoglycemia without coma: Secondary | ICD-10-CM | POA: Diagnosis not present

## 2022-10-29 DIAGNOSIS — I130 Hypertensive heart and chronic kidney disease with heart failure and stage 1 through stage 4 chronic kidney disease, or unspecified chronic kidney disease: Secondary | ICD-10-CM | POA: Diagnosis present

## 2022-10-29 DIAGNOSIS — Z1152 Encounter for screening for COVID-19: Secondary | ICD-10-CM | POA: Diagnosis not present

## 2022-10-29 DIAGNOSIS — Z713 Dietary counseling and surveillance: Secondary | ICD-10-CM

## 2022-10-29 DIAGNOSIS — Z8249 Family history of ischemic heart disease and other diseases of the circulatory system: Secondary | ICD-10-CM

## 2022-10-29 DIAGNOSIS — E1151 Type 2 diabetes mellitus with diabetic peripheral angiopathy without gangrene: Secondary | ICD-10-CM | POA: Diagnosis present

## 2022-10-29 DIAGNOSIS — L03032 Cellulitis of left toe: Secondary | ICD-10-CM | POA: Diagnosis present

## 2022-10-29 DIAGNOSIS — D631 Anemia in chronic kidney disease: Secondary | ICD-10-CM | POA: Diagnosis present

## 2022-10-29 DIAGNOSIS — Z6836 Body mass index (BMI) 36.0-36.9, adult: Secondary | ICD-10-CM | POA: Diagnosis not present

## 2022-10-29 DIAGNOSIS — I872 Venous insufficiency (chronic) (peripheral): Secondary | ICD-10-CM | POA: Diagnosis present

## 2022-10-29 DIAGNOSIS — R531 Weakness: Secondary | ICD-10-CM

## 2022-10-29 DIAGNOSIS — J9611 Chronic respiratory failure with hypoxia: Secondary | ICD-10-CM | POA: Diagnosis present

## 2022-10-29 DIAGNOSIS — S91302A Unspecified open wound, left foot, initial encounter: Secondary | ICD-10-CM | POA: Diagnosis not present

## 2022-10-29 DIAGNOSIS — I2510 Atherosclerotic heart disease of native coronary artery without angina pectoris: Secondary | ICD-10-CM | POA: Diagnosis present

## 2022-10-29 DIAGNOSIS — I10 Essential (primary) hypertension: Secondary | ICD-10-CM | POA: Diagnosis present

## 2022-10-29 DIAGNOSIS — E669 Obesity, unspecified: Secondary | ICD-10-CM | POA: Diagnosis not present

## 2022-10-29 DIAGNOSIS — N1831 Chronic kidney disease, stage 3a: Secondary | ICD-10-CM | POA: Diagnosis present

## 2022-10-29 DIAGNOSIS — Z66 Do not resuscitate: Secondary | ICD-10-CM | POA: Diagnosis present

## 2022-10-29 DIAGNOSIS — D649 Anemia, unspecified: Secondary | ICD-10-CM | POA: Diagnosis not present

## 2022-10-29 DIAGNOSIS — E785 Hyperlipidemia, unspecified: Secondary | ICD-10-CM | POA: Diagnosis present

## 2022-10-29 DIAGNOSIS — E1122 Type 2 diabetes mellitus with diabetic chronic kidney disease: Secondary | ICD-10-CM | POA: Diagnosis present

## 2022-10-29 DIAGNOSIS — X58XXXA Exposure to other specified factors, initial encounter: Secondary | ICD-10-CM | POA: Diagnosis present

## 2022-10-29 DIAGNOSIS — N3000 Acute cystitis without hematuria: Secondary | ICD-10-CM | POA: Diagnosis not present

## 2022-10-29 DIAGNOSIS — Z794 Long term (current) use of insulin: Secondary | ICD-10-CM

## 2022-10-29 DIAGNOSIS — S91102A Unspecified open wound of left great toe without damage to nail, initial encounter: Secondary | ICD-10-CM | POA: Diagnosis present

## 2022-10-29 DIAGNOSIS — Z23 Encounter for immunization: Secondary | ICD-10-CM | POA: Diagnosis present

## 2022-10-29 DIAGNOSIS — Z993 Dependence on wheelchair: Secondary | ICD-10-CM

## 2022-10-29 DIAGNOSIS — Z79899 Other long term (current) drug therapy: Secondary | ICD-10-CM

## 2022-10-29 LAB — CBC WITH DIFFERENTIAL/PLATELET
Abs Immature Granulocytes: 0.01 10*3/uL (ref 0.00–0.07)
Basophils Absolute: 0 10*3/uL (ref 0.0–0.1)
Basophils Relative: 0 %
Eosinophils Absolute: 0 10*3/uL (ref 0.0–0.5)
Eosinophils Relative: 0 %
HCT: 29.9 % — ABNORMAL LOW (ref 39.0–52.0)
Hemoglobin: 8.4 g/dL — ABNORMAL LOW (ref 13.0–17.0)
Immature Granulocytes: 0 %
Lymphocytes Relative: 70 %
Lymphs Abs: 3.4 10*3/uL (ref 0.7–4.0)
MCH: 25.5 pg — ABNORMAL LOW (ref 26.0–34.0)
MCHC: 28.1 g/dL — ABNORMAL LOW (ref 30.0–36.0)
MCV: 90.6 fL (ref 80.0–100.0)
Monocytes Absolute: 0.2 10*3/uL (ref 0.1–1.0)
Monocytes Relative: 5 %
Neutro Abs: 1.2 10*3/uL — ABNORMAL LOW (ref 1.7–7.7)
Neutrophils Relative %: 25 %
Platelets: 76 10*3/uL — ABNORMAL LOW (ref 150–400)
RBC: 3.3 MIL/uL — ABNORMAL LOW (ref 4.22–5.81)
RDW: 17.6 % — ABNORMAL HIGH (ref 11.5–15.5)
WBC: 4.9 10*3/uL (ref 4.0–10.5)
nRBC: 0.6 % — ABNORMAL HIGH (ref 0.0–0.2)

## 2022-10-29 LAB — URINALYSIS, ROUTINE W REFLEX MICROSCOPIC
Bilirubin Urine: NEGATIVE
Glucose, UA: >=500 mg/dL — AB
Ketones, ur: NEGATIVE mg/dL
Nitrite: POSITIVE — AB
Protein, ur: 100 mg/dL — AB
RBC / HPF: >50 RBC/hpf (ref 0–5)
Specific Gravity, Urine: 1.022 (ref 1.005–1.030)
WBC, UA: >50 WBC/hpf (ref 0–5)
pH: 5 (ref 5.0–8.0)

## 2022-10-29 LAB — COMPREHENSIVE METABOLIC PANEL
ALT: 10 U/L (ref 0–44)
AST: 22 U/L (ref 15–41)
Albumin: 2.8 g/dL — ABNORMAL LOW (ref 3.5–5.0)
Alkaline Phosphatase: 95 U/L (ref 38–126)
Anion gap: 10 (ref 5–15)
BUN: 18 mg/dL (ref 8–23)
CO2: 27 mmol/L (ref 22–32)
Calcium: 9.2 mg/dL (ref 8.9–10.3)
Chloride: 100 mmol/L (ref 98–111)
Creatinine, Ser: 1.37 mg/dL — ABNORMAL HIGH (ref 0.61–1.24)
GFR, Estimated: 53 mL/min — ABNORMAL LOW (ref >60)
Glucose, Bld: 140 mg/dL — ABNORMAL HIGH (ref 70–99)
Potassium: 3.5 mmol/L (ref 3.5–5.1)
Sodium: 137 mmol/L (ref 135–145)
Total Bilirubin: 0.7 mg/dL (ref 0.3–1.2)
Total Protein: 8.4 g/dL — ABNORMAL HIGH (ref 6.5–8.1)

## 2022-10-29 LAB — CBG MONITORING, ED: Glucose-Capillary: 124 mg/dL — ABNORMAL HIGH (ref 70–99)

## 2022-10-29 LAB — PROTIME-INR
INR: 1.3 — ABNORMAL HIGH (ref 0.8–1.2)
Prothrombin Time: 16.2 s — ABNORMAL HIGH (ref 11.4–15.2)

## 2022-10-29 LAB — LACTIC ACID, PLASMA: Lactic Acid, Venous: 1.4 mmol/L (ref 0.5–1.9)

## 2022-10-29 MED ORDER — LATANOPROST 0.005 % OP SOLN
1.0000 [drp] | Freq: Every day | OPHTHALMIC | Status: DC
  Administered 2022-10-31 – 2022-11-01 (×2): 1 [drp] via OPHTHALMIC
  Filled 2022-10-29 (×2): qty 2.5

## 2022-10-29 MED ORDER — INSULIN GLARGINE-YFGN 100 UNIT/ML ~~LOC~~ SOLN
40.0000 [IU] | Freq: Every day | SUBCUTANEOUS | Status: DC
  Filled 2022-10-29: qty 0.4

## 2022-10-29 MED ORDER — OXYBUTYNIN CHLORIDE ER 10 MG PO TB24
10.0000 mg | ORAL_TABLET | Freq: Every evening | ORAL | Status: DC
  Administered 2022-10-30 – 2022-11-01 (×3): 10 mg via ORAL
  Filled 2022-10-29 (×4): qty 1

## 2022-10-29 MED ORDER — SODIUM CHLORIDE 0.9 % IV BOLUS
500.0000 mL | Freq: Once | INTRAVENOUS | Status: AC
  Administered 2022-10-29: 500 mL via INTRAVENOUS

## 2022-10-29 MED ORDER — SODIUM CHLORIDE 0.9 % IV SOLN
1.0000 g | INTRAVENOUS | Status: AC
  Administered 2022-10-29: 1 g via INTRAVENOUS
  Filled 2022-10-29: qty 10

## 2022-10-29 NOTE — ED Triage Notes (Signed)
Pt states coming in with a possible UTI due to fowl smell. Pt states history of UTI

## 2022-10-29 NOTE — ED Triage Notes (Signed)
First RN Note-  Pt BIB [facility] with UTI symptoms for 3 days.   EMS vitals  97.64f 146/76 96HR 98% on 2lpm-chronic Non-ambulatory at baseline
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
## 2022-10-29 NOTE — H&P (Signed)
History and Physical    Patient: Robert [name] OZH:[id number] DOB: Jul 20, 1945 DOA: 10/29/2022 DOS: the patient was seen and examined on 10/29/2022 PCP: Corky Downs, MD  Patient coming from: Home  Chief Complaint: No chief complaint on file.  HPI: Robert [name] is a 77 y.o. male with medical history significant of type 2 diabetes, essential hypertension, hyperlipidemia, peripheral vascular disease, morbid obesity who presented to the ER with generalized weakness, dysuria, urinary frequency.  Patient believe he has UTI.  He has had a similar episode before with sepsis.  He is worried that he will get into sepsis again.  He has had some low-grade temperature.  Denied nausea or vomiting.  Patient is seen and evaluated.  Initial evaluation in the ER Indicated UTI.  Patient has been admitted for further evaluation and treatment.  Review of Systems: As mentioned in the history of present illness. All other systems reviewed and are negative. Past Medical History:  Diagnosis Date   Allergy    Cellulitis    Diabetes mellitus without complication (HCC)    Hyperlipidemia    Hypertension    Peripheral vascular disease [facility])    Past Surgical History:  Procedure Laterality Date   BIOPSY  08/21/2022   Procedure: BIOPSY;  Surgeon: [name], DO;  Location: [facility] ENDOSCOPY;  Service: Gastroenterology;;   COLONOSCOPY WITH PROPOFOL N/A 10/02/2017   Procedure: COLONOSCOPY WITH PROPOFOL;  Surgeon: Wyline Mood, MD;  Location: [facility] ENDOSCOPY;  Service: Gastroenterology;  Laterality: N/A;   COLONOSCOPY WITH PROPOFOL N/A 08/21/2022   Procedure: COLONOSCOPY WITH PROPOFOL;  Surgeon: [name], DO;  Location: [facility] ENDOSCOPY;  Service: Gastroenterology;  Laterality: N/A;   ESOPHAGOGASTRODUODENOSCOPY (EGD) WITH PROPOFOL N/A 08/11/2022   Procedure: ESOPHAGOGASTRODUODENOSCOPY (EGD) WITH PROPOFOL;  Surgeon: Midge Minium, MD;  Location: [facility] ENDOSCOPY;  Service: Endoscopy;  Laterality: N/A;    HERNIA REPAIR     PERICARDIOCENTESIS N/A 08/29/2022   Procedure: PERICARDIOCENTESIS;  Surgeon: Yvonne Kendall, MD;  Location: [facility] INVASIVE CV LAB;  Service: Cardiovascular;  Laterality: N/A;   POLYPECTOMY  08/21/2022   Procedure: POLYPECTOMY;  Surgeon: [name], DO;  Location: [facility] ENDOSCOPY;  Service: Gastroenterology;;   Social History:  reports that he has never smoked. He has never used smokeless tobacco. He reports that he does not currently use alcohol. He reports that he does not use drugs.  No Known Allergies  Family History  Problem Relation Age of Onset   Heart disease Mother    Stroke Mother    Heart disease Father    Heart attack Father     Prior to Admission medications   Medication Sig Start Date End Date Taking? Authorizing Provider  BD PEN NEEDLE NANO 2ND GEN 32G X 4 MM MISC USE 1 PEN NEEDLE EVERY DAY AS DIRECTED 01/21/21   Corky Downs, MD  colchicine 0.6 MG tablet Take 1 tablet (0.6 mg total) by mouth 2 (two) times daily. 09/12/22 12/07/22  Gillis Santa, MD  dapagliflozin propanediol (FARXIGA) 10 MG TABS tablet Take 1 tablet (10 mg total) by mouth daily. 09/13/22   Gillis Santa, MD  DULoxetine (CYMBALTA) 60 MG capsule Take 1 capsule (60 mg total) by mouth 2 (two) times daily. 10/11/21   Corky Downs, MD  furosemide (LASIX) 40 MG tablet TAKE 1 TABLET BY MOUTH EVERY DAY 06/24/19   Corky Downs, MD  insulin aspart (NOVOLOG) 100 UNIT/ML injection Inject 0-15 Units into the skin 3 (three) times daily with meals. 09/12/22   Gillis Santa, MD  insulin  aspart (NOVOLOG) 100 UNIT/ML injection Inject 0-5 Units into the skin at bedtime. 09/12/22   Gillis Santa, MD  insulin glargine (LANTUS SOLOSTAR) 100 UNIT/ML Solostar Pen Inject 25 Units into the skin daily. 09/12/22   Gillis Santa, MD  meclizine (ANTIVERT) 12.5 MG tablet TAKE 1 TABLET(12.5 MG) BY MOUTH TWICE DAILY AS NEEDED 01/30/22   Corky Downs, MD  metoprolol succinate (TOPROL-XL) 50 MG 24 hr tablet Take 1 tablet  (50 mg total) by mouth daily. Take with or immediately following a meal. 09/12/22   Gillis Santa, MD  midodrine (PROAMATINE) 5 MG tablet Take 1 tablet (5 mg total) by mouth 3 (three) times daily as needed (SBP <100). 09/12/22   Gillis Santa, MD  pantoprazole (PROTONIX) 40 MG tablet Take 1 tablet (40 mg total) by mouth daily. 09/12/22   Gillis Santa, MD  polyethylene glycol (MIRALAX / GLYCOLAX) 17 g packet Take 17 g by mouth daily. Skip the dose if no constipation 09/12/22   Gillis Santa, MD  potassium chloride SA (KLOR-CON M) 20 MEQ tablet Take 1 tablet (20 mEq total) by mouth daily. 09/13/22   Gillis Santa, MD  simvastatin (ZOCOR) 20 MG tablet TAKE 1 TABLET BY MOUTH DAILY 04/25/21   Corky Downs, MD  tolterodine (DETROL LA) 4 MG 24 hr capsule TAKE 1 CAPSULE BY MOUTH DAILY 11/28/21   Corky Downs, MD  traZODone (DESYREL) 50 MG tablet Take 0.5 tablets (25 mg total) by mouth at bedtime as needed for sleep. 09/12/22   Gillis Santa, MD    Physical Exam: Vitals:   10/29/22 1441 10/29/22 1443 10/29/22 1920 10/29/22 2104  BP:  135/72 (!) 146/81 (!) 154/123  Pulse:  91 80   Resp:  18 20   Temp:  97.9 F (36.6 C) 98.4 F (36.9 C) 98 F (36.7 C)  TempSrc:   Oral Oral  SpO2:  100% 100%   Weight: (!) 139 kg  (!) 139 kg   Height: 6\' 2"  (1.88 m)  6\' 2"  (1.88 m)    Constitutional: Chronically ill looking, morbidly obese NAD, calm, comfortable Eyes: PERRL, lids and conjunctivae normal ENMT: Mucous membranes are dry posterior pharynx clear of any exudate or lesions.Normal dentition.  Neck: normal, supple, no masses, no thyromegaly Respiratory: clear to auscultation bilaterally, no wheezing, no crackles. Normal respiratory effort. No accessory muscle use.  Cardiovascular: Regular rate and rhythm, no murmurs / rubs / gallops. No extremity edema. 2+ pedal pulses. No carotid bruits.  Abdomen: no tenderness, no masses palpated. No hepatosplenomegaly. Bowel sounds positive.  Musculoskeletal: Good range of  motion, no joint swelling or tenderness, Skin: no rashes, lesions, ulcers. No induration Neurologic: CN 2-12 grossly intact. Sensation intact, DTR normal. Strength 5/5 in all 4.  Psychiatric: Normal judgment and insight. Alert and oriented x 3. Normal mood  Data Reviewed:  Glucose 114, creatinine 1.37, albumin 2.8 lactic acid 1.4 hemoglobin 8.4, platelets 76 urinalysis showed cloudy, nitrite positive, leukocyte multiple.  Assessment and Plan:  #1 UTI: Patient will be admitted.  Initiate IV Rocephin, urine and blood cultures obtained and will follow results.  PT if tolerated.  #2 type II insulin-dependent diabetes: Sliding scale insulin.  Continue supportive care.  #3 essential hypertension: Continue blood pressure monitoring.  #4 hyperlipidemia: Continue statin.  #5 chronic kidney disease stage IIIa: Patient at baseline  #6 chronic venous insufficiency: Stable chronic and stable at baseline  #7 normocytic anemia: Chronic.  Patient also had thrombocytopenia.  Probably bone marrow defect.  Patient will need outpatient workup  #  8 thrombocytopenia: Chronic.  Patient needs outpatient workup     Advance Care Planning:   Code Status: Do not attempt resuscitation (DNR) PRE-ARREST INTERVENTIONS DESIRED   Consults: None  Family Communication: No family at bedside  Severity of Illness: The appropriate patient status for this patient is INPATIENT. Inpatient status is judged to be reasonable and necessary in order to provide the required intensity of service to ensure the patient's safety. The patient's presenting symptoms, physical exam findings, and initial radiographic and laboratory data in the context of their chronic comorbidities is felt to place them at high risk for further clinical deterioration. Furthermore, it is not anticipated that the patient will be medically stable for discharge from the hospital within 2 midnights of admission.   * I certify that at the point of admission  it is my clinical judgment that the patient will require inpatient hospital care spanning beyond 2 midnights from the point of admission due to high intensity of service, high risk for further deterioration and high frequency of surveillance required.*  AuthorLonia Blood, MD 10/29/2022 9:19 PM  For on call review [email or web address].

## 2022-10-30 ENCOUNTER — Telehealth: Payer: Self-pay | Admitting: Cardiovascular Disease

## 2022-10-30 ENCOUNTER — Inpatient Hospital Stay: Payer: Medicare Other

## 2022-10-30 ENCOUNTER — Encounter: Payer: Self-pay | Admitting: Internal Medicine

## 2022-10-30 ENCOUNTER — Other Ambulatory Visit: Payer: Self-pay

## 2022-10-30 DIAGNOSIS — I872 Venous insufficiency (chronic) (peripheral): Secondary | ICD-10-CM

## 2022-10-30 DIAGNOSIS — S91302A Unspecified open wound, left foot, initial encounter: Secondary | ICD-10-CM | POA: Diagnosis present

## 2022-10-30 DIAGNOSIS — J9611 Chronic respiratory failure with hypoxia: Secondary | ICD-10-CM | POA: Insufficient documentation

## 2022-10-30 DIAGNOSIS — D649 Anemia, unspecified: Secondary | ICD-10-CM

## 2022-10-30 DIAGNOSIS — N3000 Acute cystitis without hematuria: Secondary | ICD-10-CM | POA: Diagnosis not present

## 2022-10-30 DIAGNOSIS — N1831 Chronic kidney disease, stage 3a: Secondary | ICD-10-CM

## 2022-10-30 DIAGNOSIS — D696 Thrombocytopenia, unspecified: Secondary | ICD-10-CM

## 2022-10-30 DIAGNOSIS — I10 Essential (primary) hypertension: Secondary | ICD-10-CM | POA: Diagnosis not present

## 2022-10-30 DIAGNOSIS — E118 Type 2 diabetes mellitus with unspecified complications: Secondary | ICD-10-CM | POA: Diagnosis not present

## 2022-10-30 DIAGNOSIS — E669 Obesity, unspecified: Secondary | ICD-10-CM

## 2022-10-30 LAB — COMPREHENSIVE METABOLIC PANEL
ALT: 9 U/L (ref 0–44)
AST: 18 U/L (ref 15–41)
Albumin: 2.4 g/dL — ABNORMAL LOW (ref 3.5–5.0)
Alkaline Phosphatase: 80 U/L (ref 38–126)
Anion gap: 8 (ref 5–15)
BUN: 16 mg/dL (ref 8–23)
CO2: 28 mmol/L (ref 22–32)
Calcium: 8.8 mg/dL — ABNORMAL LOW (ref 8.9–10.3)
Chloride: 103 mmol/L (ref 98–111)
Creatinine, Ser: 1.29 mg/dL — ABNORMAL HIGH (ref 0.61–1.24)
GFR, Estimated: 57 mL/min — ABNORMAL LOW (ref >60)
Glucose, Bld: 131 mg/dL — ABNORMAL HIGH (ref 70–99)
Potassium: 4 mmol/L (ref 3.5–5.1)
Sodium: 139 mmol/L (ref 135–145)
Total Bilirubin: 0.7 mg/dL (ref 0.3–1.2)
Total Protein: 7.2 g/dL (ref 6.5–8.1)

## 2022-10-30 LAB — CBC
HCT: 25.7 % — ABNORMAL LOW (ref 39.0–52.0)
Hemoglobin: 7.4 g/dL — ABNORMAL LOW (ref 13.0–17.0)
MCH: 25.8 pg — ABNORMAL LOW (ref 26.0–34.0)
MCHC: 28.8 g/dL — ABNORMAL LOW (ref 30.0–36.0)
MCV: 89.5 fL (ref 80.0–100.0)
Platelets: 69 10*3/uL — ABNORMAL LOW (ref 150–400)
RBC: 2.87 MIL/uL — ABNORMAL LOW (ref 4.22–5.81)
RDW: 17.2 % — ABNORMAL HIGH (ref 11.5–15.5)
WBC: 4.8 10*3/uL (ref 4.0–10.5)
nRBC: 0.4 % — ABNORMAL HIGH (ref 0.0–0.2)

## 2022-10-30 LAB — GLUCOSE, CAPILLARY
Glucose-Capillary: 101 mg/dL — ABNORMAL HIGH (ref 70–99)
Glucose-Capillary: 146 mg/dL — ABNORMAL HIGH (ref 70–99)
Glucose-Capillary: 130 mg/dL — ABNORMAL HIGH (ref 70–99)
Glucose-Capillary: 161 mg/dL — ABNORMAL HIGH (ref 70–99)

## 2022-10-30 LAB — SARS CORONAVIRUS 2 BY RT PCR: SARS Coronavirus 2 by RT PCR: NEGATIVE

## 2022-10-30 MED ORDER — INSULIN ASPART 100 UNIT/ML IJ SOLN
0.0000 [IU] | Freq: Every day | INTRAMUSCULAR | Status: DC
  Administered 2022-10-30: 3 [IU] via SUBCUTANEOUS
  Filled 2022-10-30: qty 1

## 2022-10-30 MED ORDER — SODIUM CHLORIDE 0.9 % IV SOLN: INTRAVENOUS | Status: DC | PRN

## 2022-10-30 MED ORDER — DOXYCYCLINE HYCLATE 100 MG PO TABS
100.0000 mg | ORAL_TABLET | Freq: Two times a day (BID) | ORAL | Status: DC
  Administered 2022-10-30 – 2022-11-02 (×7): 100 mg via ORAL
  Filled 2022-10-30 (×7): qty 1

## 2022-10-30 MED ORDER — MIDODRINE HCL 5 MG PO TABS: 5.0000 mg | ORAL_TABLET | Freq: Three times a day (TID) | ORAL | Status: DC | PRN

## 2022-10-30 MED ORDER — ONDANSETRON HCL 4 MG/2ML IJ SOLN: 4.0000 mg | Freq: Four times a day (QID) | INTRAMUSCULAR | Status: DC | PRN

## 2022-10-30 MED ORDER — METOPROLOL SUCCINATE ER 50 MG PO TB24
50.0000 mg | ORAL_TABLET | Freq: Every day | ORAL | Status: DC
  Administered 2022-10-30 – 2022-11-02 (×4): 50 mg via ORAL
  Filled 2022-10-30 (×4): qty 1

## 2022-10-30 MED ORDER — DAPAGLIFLOZIN PROPANEDIOL 10 MG PO TABS: 10.0000 mg | ORAL_TABLET | Freq: Every day | ORAL | Status: DC

## 2022-10-30 MED ORDER — POLYETHYLENE GLYCOL 3350 17 G PO PACK
17.0000 g | PACK | Freq: Every day | ORAL | Status: DC | PRN
  Administered 2022-11-02: 17 g via ORAL
  Filled 2022-10-30: qty 1

## 2022-10-30 MED ORDER — MORPHINE SULFATE (PF) 2 MG/ML IV SOLN: 2.0000 mg | INTRAVENOUS | Status: DC | PRN

## 2022-10-30 MED ORDER — INFLUENZA VAC A&B SURF ANT ADJ 0.5 ML IM SUSY
0.5000 mL | PREFILLED_SYRINGE | INTRAMUSCULAR | Status: AC
  Administered 2022-11-02: 0.5 mL via INTRAMUSCULAR
  Filled 2022-10-30: qty 0.5

## 2022-10-30 MED ORDER — POTASSIUM CHLORIDE CRYS ER 20 MEQ PO TBCR
20.0000 meq | EXTENDED_RELEASE_TABLET | Freq: Every day | ORAL | Status: DC
  Administered 2022-10-30 – 2022-11-02 (×4): 20 meq via ORAL
  Filled 2022-10-30 (×4): qty 1

## 2022-10-30 MED ORDER — ENOXAPARIN SODIUM 80 MG/0.8ML IJ SOSY
65.0000 mg | PREFILLED_SYRINGE | INTRAMUSCULAR | Status: DC
  Administered 2022-10-30 – 2022-11-02 (×4): 65 mg via SUBCUTANEOUS
  Filled 2022-10-30 (×4): qty 0.65

## 2022-10-30 MED ORDER — INSULIN GLARGINE-YFGN 100 UNIT/ML ~~LOC~~ SOLN
40.0000 [IU] | Freq: Every day | SUBCUTANEOUS | Status: DC
  Administered 2022-10-30: 40 [IU] via SUBCUTANEOUS
  Filled 2022-10-30 (×2): qty 0.4

## 2022-10-30 MED ORDER — MECLIZINE HCL 25 MG PO TABS: 12.5000 mg | ORAL_TABLET | Freq: Two times a day (BID) | ORAL | Status: DC | PRN

## 2022-10-30 MED ORDER — ORAL CARE MOUTH RINSE: 15.0000 mL | OROMUCOSAL | Status: DC | PRN

## 2022-10-30 MED ORDER — SIMVASTATIN 20 MG PO TABS
20.0000 mg | ORAL_TABLET | Freq: Every day | ORAL | Status: DC
  Administered 2022-10-30 – 2022-11-01 (×3): 20 mg via ORAL
  Filled 2022-10-30 (×3): qty 1

## 2022-10-30 MED ORDER — INSULIN DETEMIR 100 UNIT/ML ~~LOC~~ SOLN: 20.0000 [IU] | Freq: Every day | SUBCUTANEOUS | Status: DC

## 2022-10-30 MED ORDER — SODIUM CHLORIDE 0.9 % IV SOLN
1.0000 g | INTRAVENOUS | Status: DC
  Administered 2022-10-30: 1 g via INTRAVENOUS
  Filled 2022-10-30 (×2): qty 10

## 2022-10-30 MED ORDER — PANTOPRAZOLE SODIUM 40 MG PO TBEC: 40.0000 mg | DELAYED_RELEASE_TABLET | Freq: Every day | ORAL | Status: DC

## 2022-10-30 MED ORDER — INSULIN ASPART 100 UNIT/ML IJ SOLN
0.0000 [IU] | Freq: Three times a day (TID) | INTRAMUSCULAR | Status: DC
  Administered 2022-10-30 (×2): 2 [IU] via SUBCUTANEOUS
  Administered 2022-11-01: 3 [IU] via SUBCUTANEOUS
  Administered 2022-11-02: 2 [IU] via SUBCUTANEOUS
  Filled 2022-10-30 (×4): qty 1

## 2022-10-30 MED ORDER — TRAZODONE HCL 50 MG PO TABS
25.0000 mg | ORAL_TABLET | Freq: Every evening | ORAL | Status: DC | PRN
  Administered 2022-10-30 – 2022-10-31 (×2): 25 mg via ORAL
  Filled 2022-10-30 (×2): qty 1

## 2022-10-30 MED ORDER — PANTOPRAZOLE SODIUM 20 MG PO TBEC
20.0000 mg | DELAYED_RELEASE_TABLET | Freq: Every day | ORAL | Status: DC
  Administered 2022-10-30 – 2022-11-02 (×4): 20 mg via ORAL
  Filled 2022-10-30 (×4): qty 1

## 2022-10-30 MED ORDER — DULOXETINE HCL 30 MG PO CPEP
60.0000 mg | ORAL_CAPSULE | Freq: Two times a day (BID) | ORAL | Status: DC
  Administered 2022-10-30 – 2022-11-02 (×7): 60 mg via ORAL
  Filled 2022-10-30 (×7): qty 2

## 2022-10-30 MED ORDER — FUROSEMIDE 40 MG PO TABS
40.0000 mg | ORAL_TABLET | Freq: Every day | ORAL | Status: DC
  Administered 2022-10-30 – 2022-11-02 (×4): 40 mg via ORAL
  Filled 2022-10-30 (×4): qty 1

## 2022-10-30 MED ORDER — FERROUS SULFATE 325 (65 FE) MG PO TABS
325.0000 mg | ORAL_TABLET | Freq: Two times a day (BID) | ORAL | Status: DC
  Administered 2022-10-30 – 2022-11-02 (×6): 325 mg via ORAL
  Filled 2022-10-30 (×6): qty 1

## 2022-10-30 MED ORDER — FESOTERODINE FUMARATE ER 4 MG PO TB24: 4.0000 mg | ORAL_TABLET | Freq: Every day | ORAL | Status: DC

## 2022-10-30 MED ORDER — COLCHICINE 0.6 MG PO TABS
0.6000 mg | ORAL_TABLET | Freq: Two times a day (BID) | ORAL | Status: DC
  Administered 2022-10-30 – 2022-11-02 (×7): 0.6 mg via ORAL
  Filled 2022-10-30 (×7): qty 1

## 2022-10-30 MED ORDER — ONDANSETRON HCL 4 MG PO TABS: 4.0000 mg | ORAL_TABLET | Freq: Four times a day (QID) | ORAL | Status: DC | PRN

## 2022-10-30 NOTE — Evaluation (Signed)
Occupational Therapy Evaluation Patient Details Name: Robert [name] MRN: [id number] DOB: 07-09-1945 Today's Date: 10/30/2022   History of Present Illness 77 y.o. male with medical history significant of type 2 diabetes, essential hypertension, hyperlipidemia, peripheral vascular disease, morbid obesity who presented to the ER with generalized weakness, dysuria, urinary frequency.  Patient believe he has UTI.  He has had a similar episode before with sepsis.  He is worried that he will get into sepsis again.  He has had some low-grade temperature.  Denied nausea or vomiting.  Patient is seen and evaluated.  Initial evaluation in the ER Indicated UTI.  Patient has been admitted for further evaluation and treatment.   Clinical Impression   Patient presenting with decreased Ind in self care,balance, functional mobility/transfers, endurance, and safety awareness. Patient reports recently discharging from SNF back home with wife. He is able to use rollator short distances. He sleeps in lift chair and transfers with 3 in 1 for toileting needs. He has an aide that comes 3x/wk for 4 hours to assist with ADLs and IADLs. Patient currently functioning at min guard for bed mobility, utilizes  urinal without assistance, stands from elevated surface with min A and takes several side steps.  Patient will benefit from acute OT to increase overall independence in the areas of ADLs, functional mobility, and safety awareness in order to safely discharge.       If plan is discharge home, recommend the following: A little help with walking and/or transfers;A little help with bathing/dressing/bathroom;Assistance with cooking/housework;Assist for transportation;Help with stairs or ramp for entrance    Functional Status Assessment  Patient has had a recent decline in their functional status and demonstrates the ability to make significant improvements in function in a reasonable and predictable amount of time.   Equipment Recommendations  None recommended by OT       Precautions / Restrictions Precautions Precautions: Fall      Mobility Bed Mobility Overal bed mobility: Needs Assistance Bed Mobility: Supine to Sit, Sit to Supine     Supine to sit: Contact guard Sit to supine: Contact guard assist        Transfers Overall transfer level: Needs assistance Equipment used: 1 person hand held assist, Rolling walker (2 wheels) Transfers: Sit to/from Stand Sit to Stand: Min assist, From elevated surface           General transfer comment: Pt stands/sleeps in lift chair at home and bed elevated to simulate home environment with min A to stand and take side steps along EOB      Balance Overall balance assessment: Needs assistance Sitting-balance support: Feet supported Sitting balance-[name]: Good     Standing balance support: Reliant on assistive device for balance, Bilateral upper extremity supported Standing balance-[name]: Fair                             ADL either performed or assessed with clinical judgement   ADL Overall ADL's : Needs assistance/impaired Eating/Feeding: Independent                       Toilet Transfer: Minimal assistance;Moderate assistance;Rolling walker (2 wheels) Toilet Transfer Details (indicate cue type and reason): simulated                 Vision Patient Visual Report: No change from baseline              Pertinent Vitals/Pain  Pain Assessment Pain Assessment: No/denies pain     Extremity/Trunk Assessment Upper Extremity Assessment Upper Extremity Assessment: Overall WFL for tasks assessed   Lower Extremity Assessment Lower Extremity Assessment: Generalized weakness       Communication Communication Communication: No apparent difficulties   Cognition Arousal: Alert Behavior During Therapy: WFL for tasks assessed/performed Overall Cognitive Status: Within Functional Limits for tasks  assessed                                                  Home Living Family/patient expects to be discharged to:: Private residence Living Arrangements: Spouse/significant other Available Help at Discharge: Family;Available 24 hours/day Type of Home: House Home Access: Ramped entrance     Home Layout: One level     Bathroom Shower/Tub: Chief Strategy Officer: Standard     Home Equipment: Rollator (4 wheels);Cane - single point;BSC/3in1;Lift chair          Prior Functioning/Environment Prior Level of Function : Independent/Modified Independent               ADLs Comments: Pt left rehab 2 weeks ago and was ambulating with rollator short distances. He sleeps in lift chair and transfers to 3 in 1 for toileting. Has aide that comes 3x/wk for 4 hours to assist with ADLs and IADLs.        OT Problem List: Decreased strength;Decreased activity tolerance;Decreased safety awareness;Impaired balance (sitting and/or standing);Decreased knowledge of use of DME or AE      OT Treatment/Interventions: Self-care/ADL training;Therapeutic exercise;Therapeutic activities;Energy conservation;DME and/or AE instruction;Patient/family education;Balance training    OT Goals(Current goals can be found in the care plan section) Acute Rehab OT Goals Patient Stated Goal: to go home OT Goal Formulation: With patient Time For Goal Achievement: 11/13/22 Potential to Achieve Goals: Fair ADL Goals Pt Will Perform Grooming: with supervision Pt Will Perform Lower Body Dressing: with supervision;sit to/from stand Pt Will Transfer to Toilet: with supervision;ambulating Pt Will Perform Toileting - Clothing Manipulation and hygiene: with supervision;sit to/from stand  OT Frequency: Min 1X/week       AM-PAC OT "6 Clicks" Daily Activity     Outcome Measure Help from another person eating meals?: None Help from another person taking care of personal grooming?:  None Help from another person toileting, which includes using toliet, bedpan, or urinal?: A Lot Help from another person bathing (including washing, rinsing, drying)?: A Lot Help from another person to put on and taking off regular upper body clothing?: None Help from another person to put on and taking off regular lower body clothing?: A Lot 6 Click Score: 18   End of Session Equipment Utilized During Treatment: Rolling walker (2 wheels) Nurse Communication: Mobility status  Activity Tolerance: Patient tolerated treatment well Patient left: in bed;with call bell/phone within reach;with bed alarm set  OT Visit Diagnosis: Unsteadiness on feet (R26.81);Repeated falls (R29.6);Muscle weakness (generalized) (M62.81)                Time: 1478-2956 OT Time Calculation (min): 14 min Charges:  OT General Charges $OT Visit: 1 Visit OT Evaluation $OT Eval Moderate Complexity: 1 Mod OT Treatments $Therapeutic Activity: 8-22 mins  Jackquline Denmark, MS, OTR/L , CBIS ascom [phone]  10/30/22, 1:30 PM

## 2022-10-30 NOTE — Assessment & Plan Note (Signed)
Blood pressure within goal. -Continue home metoprolol and Lasix

## 2022-10-30 NOTE — Assessment & Plan Note (Signed)
Patient with dysuria, no leukocytosis or fever.  UA consistent with UTI. Urine cultures ordered-pending -Continue with ceftriaxone

## 2022-10-30 NOTE — Assessment & Plan Note (Signed)
Stable on baseline oxygen requirement of 2 L. -Continue supplemental oxygen

## 2022-10-30 NOTE — Hospital Course (Addendum)
Taken from H&P.  Robert [name] is a 77 y.o. male with medical history significant of type 2 diabetes, essential hypertension, hyperlipidemia, peripheral vascular disease, morbid obesity who presented to the ER with generalized weakness, dysuria, urinary frequency.   On presentation vital stable, afebrile, labs seems stable around baseline, UA positive for protein, nitrite, pyuria and rare bacteria-urine cultures were sent and he was started on ceftriaxone.  9/9: Vital stable, some decrease of hemoglobin to 7.4, no obvious bleeding, also Decreased so are likely some dilutional effect.  COVID-19 PCR negative.  Patient with a chronic wound with surrounding signs of cellulitis on left 3 toes, imaging negative for any osteo, did show some arthritis.  Starting on Doxy with ceftriaxone and consulted wound care.  Pending urine culture and PT evaluation.  Patient is mostly wheelchair-bound at baseline, was in rehab last month and would like to go home with home health services. Recent anemia panel consistent with anemia of chronic disease and mild iron deficiency-starting on iron supplement.  B12 and folate was normal.  9/10: Patient appears to be at baseline.  Physical therapist recommended home health services.  We ordered maximum home health services pending insurance approval as wife is unable to provide much assistance.  Patient is wheelchair-bound at baseline. Urine cultures are still pending, patient was given 3 more days of Keflex to complete the course. He was also given doxycycline for 1 week for left foot wound and some surrounding cellulitis. Dressing change instructions were provided. We stopped home [place] for concern of UTI.  Patient will continue the rest of his home medications and need to have a close follow-up with his providers for further recommendations.  9/11: Remained hemodynamically stable, he was discharged yesterday but wife appealed the discharge.  Urine culture with MSSA,  patient is currently on Keflex and doxycycline for UTI and concern of right foot cellulitis and a small wound.  Patient remained medically stable for discharge.  Maximum home health services were ordered yesterday.

## 2022-10-30 NOTE — Assessment & Plan Note (Signed)
Renal function seems stable. -Monitor renal function -Avoid nephrotoxins

## 2022-10-30 NOTE — Progress Notes (Signed)
PHARMACIST - PHYSICIAN COMMUNICATION  CONCERNING:  Enoxaparin (Lovenox) for DVT Prophylaxis    RECOMMENDATION: Patient was prescribed enoxaprin 40mg  q24 hours for VTE prophylaxis.   Filed Weights   10/29/22 1441 10/29/22 1920 10/30/22 0440  Weight: (!) 139 kg (306 lb 7 oz) (!) 139 kg (306 lb 7 oz) 127.9 kg (282 lb)    Body mass index is 36.21 kg/m.  Estimated Creatinine Clearance: 64.2 mL/min (A) (by C-G formula based on SCr of 1.37 mg/dL (H)).   Based on [facility] policy patient is candidate for enoxaparin 0.5mg /kg TBW SQ every 24 hours based on BMI being >30.  DESCRIPTION: Pharmacy has adjusted enoxaparin dose per [facility] policy.  Patient is now receiving enoxaparin 0.5 mg/kg every 24 hours   Otelia Sergeant, PharmD, [facility] 10/30/2022 5:41 AM

## 2022-10-30 NOTE — Assessment & Plan Note (Signed)
-

## 2022-10-30 NOTE — Evaluation (Signed)
Physical Therapy Evaluation Patient Details Name: Robert [name] MRN: [id number] DOB: 1945-08-12 Today's Date: 10/30/2022  History of Present Illness  77 y.o. male with medical history significant of type 2 diabetes, essential hypertension, hyperlipidemia, peripheral vascular disease, morbid obesity who presented to the ER with generalized weakness, dysuria, urinary frequency.  Patient believe he has UTI.  He has had a similar episode before with sepsis.  He is worried that he will get into sepsis again.  He has had some low-grade temperature.  Denied nausea or vomiting.  Patient is seen and evaluated.  Initial evaluation in the ER Indicated UTI.  Patient has been admitted for further evaluation and treatment.  Clinical Impression   Pt received in bed, he is agreeable to PT session. Pt performs bed mobility CGA and transfers min A for safety. Pt able to perform STS min A on elevated surface with cuing for hand placement and upright standing posture. During standing time, Pt able to perform 4x lat steps min A and mini squats to promote BLE strength and independence. Pt reports 4-5/10 NPS on modified Borg for fatigue following activities, SpO2- 95%. Overall, Pt is highly motivated and cooperative during session and expresses desire to focus on regaining LE strength. Pt would benefit from skilled PT to address above deficits and promote optimal return to PLOF.       If plan is discharge home, recommend the following: A little help with walking and/or transfers;A little help with bathing/dressing/bathroom;Assist for transportation;Help with stairs or ramp for entrance   Can travel by private vehicle        Equipment Recommendations None recommended by PT  Recommendations for Other Services       Functional Status Assessment Patient has had a recent decline in their functional status and demonstrates the ability to make significant improvements in function in a reasonable and predictable amount  of time.     Precautions / Restrictions Precautions Precautions: Fall Restrictions Weight Bearing Restrictions: No      Mobility  Bed Mobility Overal bed mobility: Needs Assistance Bed Mobility: Supine to Sit, Sit to Supine     Supine to sit: Contact guard, HOB elevated, Used rails Sit to supine: Contact guard assist, HOB elevated, Used rails   General bed mobility comments: Able to perform bed mobility with use of hand rails and elevated HOB, CGA for safety    Transfers Overall transfer level: Needs assistance Equipment used: Rolling walker (2 wheels) Transfers: Sit to/from Stand Sit to Stand: Min assist, From elevated surface           General transfer comment: Pt able to perform x1 STS from elevated bed with min A for completion of task; x4 lat steps towards HOB using RW min A    Ambulation/Gait               General Gait Details: Not performed at this time due to fatigue  Stairs            Wheelchair Mobility     Tilt Bed    Modified Rankin (Stroke Patients Only)       Balance Overall balance assessment: Needs assistance Sitting-balance support: Feet supported Sitting balance-[name]: Good Sitting balance - Comments: Able to maintain seated balance EOB during functional activities   Standing balance support: Reliant on assistive device for balance, Bilateral upper extremity supported, During functional activity Standing balance-[name]: Good Standing balance comment: Able to maintain static standing balance using RW with min swaying noted  Pertinent Vitals/Pain Pain Assessment Pain Assessment: No/denies pain    Home Living Family/patient expects to be discharged to:: Private residence Living Arrangements: Spouse/significant other Available Help at Discharge: Family;Available 24 hours/day Type of Home: House Home Access: Ramped entrance       Home Layout: One level Home Equipment:  Rollator (4 wheels);Cane - single point;BSC/3in1;Lift chair      Prior Function Prior Level of Function : Independent/Modified Independent             Mobility Comments: pt reported using SPC or sharing rollator with wife for household distances as needed, and community ambulation ADLs Comments: Pt left rehab 2 weeks ago and was ambulating with rollator short distances. He sleeps in lift chair and transfers to 3 in 1 for toileting. Has aide that comes 3x/wk for 4 hours to assist with ADLs and IADLs.     Extremity/Trunk Assessment   Upper Extremity Assessment Upper Extremity Assessment: Defer to OT evaluation    Lower Extremity Assessment Lower Extremity Assessment: Generalized weakness       Communication   Communication Communication: No apparent difficulties Cueing Techniques: Verbal cues;Tactile cues  Cognition Arousal: Alert Behavior During Therapy: WFL for tasks assessed/performed Overall Cognitive Status: Within Functional Limits for tasks assessed                                 General Comments: Pleasant and eager to work with PT        General Comments      Exercises Other Exercises Other Exercises: 2x5 mini squats at EOB using RW to focus on B LE strength   Assessment/Plan    PT Assessment Patient needs continued PT services  PT Problem List Decreased strength;Decreased mobility;Decreased activity tolerance;Decreased balance       PT Treatment Interventions Gait training;Functional mobility training;Therapeutic activities;Therapeutic exercise;Balance training    PT Goals (Current goals can be found in the Care Plan section)  Acute Rehab PT Goals Patient Stated Goal: to go home PT Goal Formulation: With patient Time For Goal Achievement: 11/13/22 Potential to Achieve Goals: Good    Frequency Min 1X/week     Co-evaluation               AM-PAC PT "6 Clicks" Mobility  Outcome Measure Help needed turning from your back to  your side while in a flat bed without using bedrails?: None Help needed moving from lying on your back to sitting on the side of a flat bed without using bedrails?: None Help needed moving to and from a bed to a chair (including a wheelchair)?: A Little Help needed standing up from a chair using your arms (e.g., wheelchair or bedside chair)?: A Little Help needed to walk in hospital room?: A Lot Help needed climbing 3-5 steps with a railing? : A Lot 6 Click Score: 18    End of Session Equipment Utilized During Treatment: Gait belt Activity Tolerance: Patient tolerated treatment well Patient left: in bed;with call bell/phone within reach;with bed alarm set Nurse Communication: Mobility status PT Visit Diagnosis: Unsteadiness on feet (R26.81);Muscle weakness (generalized) (M62.81)    Time: 4098-1191 PT Time Calculation (min) (ACUTE ONLY): 12 min   Charges:                 Elmon Else, SPT   Judah Chevere 10/30/2022, 2:40 PM

## 2022-10-30 NOTE — Assessment & Plan Note (Signed)
Estimated body mass index is 36.21 kg/m as calculated from the following:   Height as of this encounter: 6\' 2"  (1.88 m).   Weight as of this encounter: 127.9 kg.   -Encouraged weight loss

## 2022-10-30 NOTE — Assessment & Plan Note (Signed)
Patient has left first 3 toes small partial thickness wound with surrounding erythema and edema consistent with cellulitis for the past 1 month, stating that he had some injury and then wound never healed.  Left foot imaging negative for any osteomyelitis. -Add doxycycline -Wound care consult

## 2022-10-30 NOTE — Assessment & Plan Note (Signed)
Seems well-controlled with last A1c of 5.4 in June 2024. CBG within goal -Holding home Robert [name] due to UTI -Continue with home dose of Semglee and SSI

## 2022-10-30 NOTE — Telephone Encounter (Signed)
Left a message for the patient to call back.  
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
## 2022-10-30 NOTE — Consult Note (Signed)
WOC Nurse Consult Note: Reason for Consult: toe wounds Partial thickness skin loss between the great toe and the 2nd toe left foot, lesion 3rd toe, redness of the 2nd toe.  Left foot xray pending  Wound type: partial thickness lesion with associated cellulitis left 1-3 toes  Pressure Injury POA: No Measurement: see nursing flow sheets Wound bed: see above, pink, clean, scabbing on the dorsal toes Drainage (amount, consistency, odor) none documented  Periwound: intact  Dressing procedure/placement/frequency: Cleanse wounds with saline, pat dry Cut to fit xeroform gauze and place over wounds, weave between toes. Change daily.    Re consult if needed, will not follow at this time. Thanks  Jeanni Allshouse [employer], RN,CWOCN, CNS, CWON-AP [phone])

## 2022-10-30 NOTE — Assessment & Plan Note (Signed)
Seems chronic, with slow worsening. -Outpatient follow-up with hematology

## 2022-10-30 NOTE — Assessment & Plan Note (Signed)
Seems stable. -Continue home Lasix

## 2022-10-30 NOTE — Telephone Encounter (Signed)
Patients wife is calling to talk with Dr. Mariah Milling or nurse about an important question she has from June hospital visit. Dr. Mariah Milling was on call that week

## 2022-10-30 NOTE — Progress Notes (Addendum)
Progress Note   Patient: Robert [name]:[id number] DOB: November 10, 1945 DOA: 10/29/2022     1 DOS: the patient was seen and examined on 10/30/2022   Brief hospital course: Taken from H&P.  Robert [name] is a 77 y.o. male with medical history significant of type 2 diabetes, essential hypertension, hyperlipidemia, peripheral vascular disease, morbid obesity who presented to the ER with generalized weakness, dysuria, urinary frequency.   On presentation vital stable, afebrile, labs seems stable around baseline, UA positive for protein, nitrite, pyuria and rare bacteria-urine cultures were sent and he was started on ceftriaxone.  9/9: Vital stable, some decrease of hemoglobin to 7.4, no obvious bleeding, also Decreased so are likely some dilutional effect.  COVID-19 PCR negative.  Patient with a chronic wound with surrounding signs of cellulitis on left 3 toes, imaging negative for any osteo, did show some arthritis.  Starting on Doxy with ceftriaxone and consulted wound care.  Pending urine culture and PT evaluation.  Patient is mostly wheelchair-bound at baseline, was in rehab last month and would like to go home with home health services. Recent anemia panel consistent with anemia of chronic disease and mild iron deficiency-starting on iron supplement.  B12 and folate was normal.  Assessment and Plan: * UTI (urinary tract infection) Patient with dysuria, no leukocytosis or fever.  UA consistent with UTI. Urine cultures ordered-pending -Continue with ceftriaxone  Open wound of left foot Patient has left first 3 toes small partial thickness wound with surrounding erythema and edema consistent with cellulitis for the past 1 month, stating that he had some injury and then wound never healed.  Left foot imaging negative for any osteomyelitis. -Add doxycycline -Wound care consult  Type 2 diabetes mellitus with complication (HCC) Seems well-controlled with last A1c of 5.4 in June 2024. CBG  within goal -Holding home Marcelline Deist due to UTI -Continue with home dose of Semglee and SSI  Essential hypertension Blood pressure within goal. -Continue home metoprolol and Lasix  Chronic venous insufficiency Seems stable. -Continue home Lasix  Normocytic anemia Hemoglobin decreased to 7.4 but all cell lines decreased, likely some dilutional effect. Anemia panel done in July 2024 with concern of anemia of chronic disease with some iron deficiency.  B12 and folate was normal. -Starting iron supplement -Monitor hemoglobin -Transfuse if below 7  Thrombocytopenia (HCC) Seems chronic, with slow worsening. -Outpatient follow-up with hematology  Stage 3a chronic kidney disease (HCC) Renal function seems stable. -Monitor renal function -Avoid nephrotoxins  Hyperlipidemia -Continue home statin  Obesity (BMI 30-39.9) Estimated body mass index is 36.21 kg/m as calculated from the following:   Height as of this encounter: 6\' 2"  (1.88 m).   Weight as of this encounter: 127.9 kg.   -Encouraged weight loss  Chronic respiratory failure with hypoxia (HCC) Stable on baseline oxygen requirement of 2 L. -Continue supplemental oxygen   Subjective: Patient continued to have some dysuria.  Complaining of left foot pain stating that he has a wound there which is not healing for the past month.  History of some injury to the foot when he was at a rehab facility a month ago.  Physical Exam: Vitals:   10/30/22 0400 10/30/22 0417 10/30/22 0440 10/30/22 0811  BP: 120/63  (!) 148/86 130/72  Pulse: 85  87 83  Resp: 16  20 20   Temp:  97.6 F (36.4 C) 97.7 F (36.5 C) 98.7 F (37.1 C)  TempSrc:  Oral Oral   SpO2: 100%  100% 100%  Weight:  127.9 kg   Height:   6\' 2"  (1.88 m)    General.  Obese gentleman, in no acute distress. Pulmonary.  Lungs clear bilaterally, normal respiratory effort. CV.  Regular rate and rhythm, no JVD, rub or murmur. Abdomen.  Soft, nontender, nondistended, BS  positive. CNS.  Alert and oriented .  No focal neurologic deficit. Extremities.  No edema, no cyanosis, pulses intact and symmetrical.  Left foot with a partial-thickness wound at the base of great and second toe, signs of cellulitis including mild erythema and edema involving 1-3 toes. Psychiatry.  Judgment and insight appears normal.        Data Reviewed: Prior data reviewed  Family Communication: Talked with wife on phone.  Disposition: Status is: Inpatient Remains inpatient appropriate because: Severity of illness  Planned Discharge Destination: Home with Home Health  Time spent: 45 minutes  This record has been created using Conservation officer, historic buildings. Errors have been sought and corrected,but may not always be located. Such creation errors do not reflect on the standard of care.   Author: Arnetha Courser, MD 10/30/2022 1:34 PM  For on call review [email or web address].

## 2022-10-30 NOTE — Assessment & Plan Note (Signed)
Hemoglobin decreased to 7.4 but all cell lines decreased, likely some dilutional effect. Anemia panel done in July 2024 with concern of anemia of chronic disease with some iron deficiency.  B12 and folate was normal. -Starting iron supplement -Monitor hemoglobin -Transfuse if below 7

## 2022-10-30 NOTE — Progress Notes (Signed)
   10/30/22 1200  Spiritual Encounters  Type of Visit Initial  Care provided to: Patient  Referral source Patient request  Reason for visit Routine spiritual support  OnCall Visit No  Spiritual Framework  Presenting Themes Meaning/purpose/sources of inspiration;Impactful experiences and emotions  Community/Connection Family  Patient Stress Factors Health changes  Interventions  Spiritual Care Interventions Made Established relationship of care and support;Compassionate presence;Reflective listening  Spiritual Care Plan  Spiritual Care Issues Still Outstanding Chaplain will continue to follow   Chaplain received spiritual consult for prayer. Chaplain met with the patient and listened to how he is feeling about his health. Chaplain and patient did not continue their conversation because he had to get Xray's. Chaplain will follow up with patient later.

## 2022-10-31 DIAGNOSIS — N3000 Acute cystitis without hematuria: Secondary | ICD-10-CM | POA: Diagnosis not present

## 2022-10-31 DIAGNOSIS — J9611 Chronic respiratory failure with hypoxia: Secondary | ICD-10-CM

## 2022-10-31 DIAGNOSIS — R531 Weakness: Secondary | ICD-10-CM | POA: Diagnosis not present

## 2022-10-31 DIAGNOSIS — E118 Type 2 diabetes mellitus with unspecified complications: Secondary | ICD-10-CM | POA: Diagnosis not present

## 2022-10-31 DIAGNOSIS — D696 Thrombocytopenia, unspecified: Secondary | ICD-10-CM | POA: Diagnosis not present

## 2022-10-31 LAB — GLUCOSE, CAPILLARY
Glucose-Capillary: 67 mg/dL — ABNORMAL LOW (ref 70–99)
Glucose-Capillary: 78 mg/dL (ref 70–99)
Glucose-Capillary: 116 mg/dL — ABNORMAL HIGH (ref 70–99)
Glucose-Capillary: 163 mg/dL — ABNORMAL HIGH (ref 70–99)

## 2022-10-31 MED ORDER — TRAMADOL HCL 50 MG PO TABS: 50.0000 mg | ORAL_TABLET | Freq: Four times a day (QID) | ORAL | Status: DC | PRN

## 2022-10-31 MED ORDER — FUROSEMIDE 40 MG PO TABS: 40.0000 mg | ORAL_TABLET | Freq: Every day | ORAL | 3 refills | Status: DC

## 2022-10-31 MED ORDER — LANTUS SOLOSTAR 100 UNIT/ML ~~LOC~~ SOPN: 40.0000 [IU] | PEN_INJECTOR | Freq: Every evening | SUBCUTANEOUS | Status: DC

## 2022-10-31 MED ORDER — FERROUS SULFATE 325 (65 FE) MG PO TABS: 325.0000 mg | ORAL_TABLET | Freq: Two times a day (BID) | ORAL | 3 refills | Status: DC

## 2022-10-31 MED ORDER — INSULIN GLARGINE-YFGN 100 UNIT/ML ~~LOC~~ SOLN
30.0000 [IU] | Freq: Every day | SUBCUTANEOUS | Status: DC
  Administered 2022-10-31: 30 [IU] via SUBCUTANEOUS

## 2022-10-31 MED ORDER — CEPHALEXIN 500 MG PO CAPS: 500.0000 mg | ORAL_CAPSULE | Freq: Three times a day (TID) | ORAL | 0 refills | Status: AC

## 2022-10-31 MED ORDER — CEPHALEXIN 500 MG PO CAPS
500.0000 mg | ORAL_CAPSULE | Freq: Three times a day (TID) | ORAL | Status: DC
  Administered 2022-10-31 – 2022-11-02 (×7): 500 mg via ORAL
  Filled 2022-10-31 (×7): qty 1

## 2022-10-31 MED ORDER — POLYETHYLENE GLYCOL 3350 17 G PO PACK: 17.0000 g | PACK | Freq: Every day | ORAL | 0 refills | Status: DC

## 2022-10-31 MED ORDER — INSULIN GLARGINE-YFGN 100 UNIT/ML ~~LOC~~ SOLN
30.0000 [IU] | Freq: Every day | SUBCUTANEOUS | Status: DC
Start: 2022-11-01 — End: 2022-10-31

## 2022-10-31 MED ORDER — DOXYCYCLINE HYCLATE 100 MG PO TABS: 100.0000 mg | ORAL_TABLET | Freq: Two times a day (BID) | ORAL | 0 refills | Status: AC

## 2022-10-31 NOTE — Inpatient Diabetes Management (Signed)
Inpatient Diabetes Program Recommendations  AACE/ADA: New Consensus Statement on Inpatient Glycemic Control  Target Ranges:  Prepandial:   less than 140 mg/dL      Peak postprandial:   less than 180 mg/dL (1-2 hours)      Critically ill patients:  140 - 180 mg/dL    Latest Reference Range & Units 10/31/22 08:28 10/31/22 08:41  Glucose-Capillary 70 - 99 mg/dL 67 (L) 78    Latest Reference Range & Units 10/29/22 18:44 10/30/22 08:12 10/30/22 12:06 10/30/22 16:50 10/30/22 21:33  Glucose-Capillary 70 - 99 mg/dL 387 (H) 564 (H) 332 (H) 130 (H) 161 (H)   Review of Glycemic Control  Diabetes history: DM2 Outpatient Diabetes medications: Lantus 40 units QPM, Novolog 0-15 units TID with meals, Novolog 0-5 units QHS Current orders for Inpatient glycemic control: Semglee 40 units at bedtime, Novolog 0-15 units TID with meals, Novolog 0-5 units QHS  Inpatient Diabetes Program Recommendations:    Insulin: Fasting CBG 67 mg/dl today. Please consider decreasing Semglee to 30 units at bedtime.  Thanks, Orlando Penner, RN, MSN, CDCES Diabetes Coordinator Inpatient Diabetes Program [phone] (Team Pager from 8am to 5pm)

## 2022-10-31 NOTE — TOC Progression Note (Signed)
Transition of Care [facility]) - Progression Note    Patient Details  Name: ALICE BADENHOP MRN: [id number] Date of Birth: 1945-10-08  Transition of Care [facility]) CM/SW Contact  [name] Phone Number: 10/31/2022, 4:37 PM   [facility] Appeal Detailed Notice of Discharge letter created and saved: Yes (Completed by Darolyn Rua, LCSW) Detailed Notice of Discharge Document Given to P[employer]: Yes (Completed by Darolyn Rua, LCSW) [employer] ROI Document Created: Yes [employer] appeal documents uploaded to [employer] stite: Yes (Upload Confirmation: [id number])   Case ID: [id number]

## 2022-10-31 NOTE — Discharge Summary (Signed)
Physician Discharge Summary   Patient: Robert [name] MRN: [id number] DOB: 1945/04/30  Admit date:     10/29/2022  Discharge date: 10/31/22  Discharge Physician: Arnetha Courser   PCP: Corky Downs, MD   Recommendations at discharge:  Please obtain CBC and BMP in 1 week Patient is being given antibiotics for concern of UTI and left foot wound with mild surrounding cellulitis, please ensure the completion of course Please follow-up on final urine culture results Follow-up with primary care provider.  Discharge Diagnoses: Principal Problem:   UTI (urinary tract infection) Active Problems:   Open wound of left foot   Type 2 diabetes mellitus with complication (HCC)   Essential hypertension   Chronic venous insufficiency   Normocytic anemia   Thrombocytopenia (HCC)   Stage 3a chronic kidney disease (HCC)   Hyperlipidemia   Obesity (BMI 30-39.9)   Chronic respiratory failure with hypoxia (HCC)   General weakness   Hospital Course: Taken from H&P.  Robert [name] is a 77 y.o. male with medical history significant of type 2 diabetes, essential hypertension, hyperlipidemia, peripheral vascular disease, morbid obesity who presented to the ER with generalized weakness, dysuria, urinary frequency.   On presentation vital stable, afebrile, labs seems stable around baseline, UA positive for protein, nitrite, pyuria and rare bacteria-urine cultures were sent and he was started on ceftriaxone.  9/9: Vital stable, some decrease of hemoglobin to 7.4, no obvious bleeding, also Decreased so are likely some dilutional effect.  COVID-19 PCR negative.  Patient with a chronic wound with surrounding signs of cellulitis on left 3 toes, imaging negative for any osteo, did show some arthritis.  Starting on Doxy with ceftriaxone and consulted wound care.  Pending urine culture and PT evaluation.  Patient is mostly wheelchair-bound at baseline, was in rehab last month and would like to go home with  home health services. Recent anemia panel consistent with anemia of chronic disease and mild iron deficiency-starting on iron supplement.  B12 and folate was normal.  9/10: Patient appears to be at baseline.  Physical therapist recommended home health services.  We ordered maximum home health services pending insurance approval as wife is unable to provide much assistance.  Patient is wheelchair-bound at baseline. Urine cultures are still pending, patient was given 3 more days of Keflex to complete the course. He was also given doxycycline for 1 week for left foot wound and some surrounding cellulitis. Dressing change instructions were provided. We stopped home [place] for concern of UTI.  Patient will continue the rest of his home medications and need to have a close follow-up with his providers for further recommendations.  Assessment and Plan: * UTI (urinary tract infection) Patient with dysuria, no leukocytosis or fever.  UA consistent with UTI. Urine cultures ordered-pending -Continue with ceftriaxone  Open wound of left foot Patient has left first 3 toes small partial thickness wound with surrounding erythema and edema consistent with cellulitis for the past 1 month, stating that he had some injury and then wound never healed.  Left foot imaging negative for any osteomyelitis. -Add doxycycline -Wound care consult  Type 2 diabetes mellitus with complication (HCC) Seems well-controlled with last A1c of 5.4 in June 2024. CBG within goal -Holding home Marcelline Deist due to UTI -Continue with home dose of Semglee and SSI  Essential hypertension Blood pressure within goal. -Continue home metoprolol and Lasix  Chronic venous insufficiency Seems stable. -Continue home Lasix  Normocytic anemia Hemoglobin decreased to 7.4 but all cell lines decreased, likely  some dilutional effect. Anemia panel done in July 2024 with concern of anemia of chronic disease with some iron deficiency.  B12 and  folate was normal. -Starting iron supplement -Monitor hemoglobin -Transfuse if below 7  Thrombocytopenia (HCC) Seems chronic, with slow worsening. -Outpatient follow-up with hematology  Stage 3a chronic kidney disease (HCC) Renal function seems stable. -Monitor renal function -Avoid nephrotoxins  Hyperlipidemia -Continue home statin  Obesity (BMI 30-39.9) Estimated body mass index is 36.21 kg/m as calculated from the following:   Height as of this encounter: 6\' 2"  (1.88 m).   Weight as of this encounter: 127.9 kg.   -Encouraged weight loss  Chronic respiratory failure with hypoxia (HCC) Stable on baseline oxygen requirement of 2 L. -Continue supplemental oxygen   Consultants: None Procedures performed: None Disposition: Home health Diet recommendation:  Discharge Diet Orders (From admission, onward)     Start     Ordered   10/31/22 0000  Diet - low sodium heart healthy        10/31/22 1115           Cardiac and Carb modified diet DISCHARGE MEDICATION: Allergies as of 10/31/2022   No Known Allergies      Medication List     STOP taking these medications    dapagliflozin propanediol 10 MG Tabs tablet Commonly known as: FARXIGA       TAKE these medications    BD Pen Needle Nano 2nd Gen 32G X 4 MM Misc Generic drug: Insulin Pen Needle USE 1 PEN NEEDLE EVERY DAY AS DIRECTED   cephALEXin 500 MG capsule Commonly known as: KEFLEX Take 1 capsule (500 mg total) by mouth every 8 (eight) hours for 8 doses.   colchicine 0.6 MG tablet Take 1 tablet (0.6 mg total) by mouth 2 (two) times daily.   doxycycline 100 MG tablet Commonly known as: VIBRA-TABS Take 1 tablet (100 mg total) by mouth every 12 (twelve) hours for 14 doses.   DULoxetine 60 MG capsule Commonly known as: Cymbalta Take 1 capsule (60 mg total) by mouth 2 (two) times daily.   ferrous sulfate 325 (65 FE) MG tablet Take 1 tablet (325 mg total) by mouth 2 (two) times daily with a meal.    furosemide 40 MG tablet Commonly known as: LASIX Take 1 tablet (40 mg total) by mouth daily. What changed: Another medication with the same name was removed. Continue taking this medication, and follow the directions you see here.   insulin aspart 100 UNIT/ML injection Commonly known as: novoLOG Inject 0-15 Units into the skin 3 (three) times daily with meals. What changed: Another medication with the same name was removed. Continue taking this medication, and follow the directions you see here.   Klor-Con M10 10 MEQ tablet Generic drug: potassium chloride Take 10 mEq by mouth daily as needed. With furosemide.   Lantus SoloStar 100 UNIT/ML Solostar Pen Generic drug: insulin glargine Inject 40 Units into the skin every evening.   latanoprost 0.005 % ophthalmic solution Commonly known as: XALATAN Place 1 drop into both eyes at bedtime.   meclizine 12.5 MG tablet Commonly known as: ANTIVERT TAKE 1 TABLET(12.5 MG) BY MOUTH TWICE DAILY AS NEEDED What changed: See the new instructions.   melatonin 5 MG Tabs Take 5 mg by mouth at bedtime as needed.   metoprolol succinate 50 MG 24 hr tablet Commonly known as: TOPROL-XL Take 1 tablet (50 mg total) by mouth daily. Take with or immediately following a meal.   midodrine 5  MG tablet Commonly known as: PROAMATINE Take 1 tablet (5 mg total) by mouth 3 (three) times daily as needed (SBP <100).   oxybutynin 10 MG 24 hr tablet Commonly known as: DITROPAN-XL Take 10 mg by mouth every evening.   pantoprazole 20 MG tablet Commonly known as: PROTONIX Take 20 mg by mouth in the morning.   polyethylene glycol 17 g packet Commonly known as: MIRALAX / GLYCOLAX Take 17 g by mouth daily. Skip the dose if no constipation   simvastatin 20 MG tablet Commonly known as: ZOCOR TAKE 1 TABLET BY MOUTH DAILY               Discharge Care Instructions  (From admission, onward)           Start     Ordered   10/31/22 0000  Discharge  wound care:       Comments: Cleanse toe wounds with saline, pat dry Apply single layer of xeroform gauze to the ulcer, weave between toes.  Change every other day.   10/31/22 1115            Follow-up Information     Corky Downs, MD. Schedule an appointment as soon as possible for a visit in 1 week(s).   Specialties: Internal Medicine, Cardiology Contact information: [address] [place] [address] [phone]                Discharge Exam: Ceasar Mons Weights   10/29/22 1441 10/29/22 1920 10/30/22 0440  Weight: (!) 139 kg (!) 139 kg 127.9 kg   General.  Obese elderly man, in no acute distress. Pulmonary.  Lungs clear bilaterally, normal respiratory effort. CV.  Regular rate and rhythm, no JVD, rub or murmur. Abdomen.  Soft, nontender, nondistended, BS positive. CNS.  Alert and oriented .  No focal neurologic deficit. Extremities.  No edema, no cyanosis, pulses intact and symmetrical.  Left foot with bandage Psychiatry.  Judgment and insight appears normal.   Condition at discharge: stable  The results of significant diagnostics from this hospitalization (including imaging, microbiology, ancillary and laboratory) are listed below for reference.   Imaging Studies: DG Foot Complete Left  Result Date: 10/30/2022 CLINICAL DATA:  Chronic left foot wound. EXAM: LEFT FOOT - COMPLETE 3+ VIEW COMPARISON:  Left fifth toe radiographs 08/21/2015 (multiple studies) FINDINGS: The fifth toe PIP joint is normally aligned, noting prior dislocation and subsequent reduction on the comparison 08/21/2015 radiographs. Borderline mild hallux valgus. Mild lateral and dorsal great toe metatarsophalangeal joint space narrowing and peripheral osteophytosis. Mild dorsal talonavicular and tarsometatarsal degenerative osteophytes on lateral view. Mild-to-moderate plantar calcaneal heel spur. No definitive soft tissue wound/ulcer is identified. No cortical erosion is seen. There is mild great toe  soft tissue swelling. IMPRESSION: 1. No definitive soft tissue wound/ulcer is identified. No cortical erosion is seen. 2. Mild great toe metatarsophalangeal osteoarthritis. 3. Mild-to-moderate plantar calcaneal heel spur. Electronically Signed   By: Neita Garnet M.D.   On: 10/30/2022 12:54    Microbiology: Results for orders placed or performed during the hospital encounter of 10/29/22  Urine Culture     Status: None (Preliminary result)   Collection Time: 10/29/22  6:26 PM   Specimen: Urine, Random  Result Value Ref Range Status   Specimen Description   Final    URINE, RANDOM Performed at [facility], [address], [place], [place] [address]    Special Requests   Final    NONE Performed at [facility], [address] [place], [place] [address]    Culture   Final    CULTURE REINCUBATED FOR BETTER GROWTH Performed at [facility], [address], [place], [place] [address]    Report Status PENDING  Incomplete  SARS Coronavirus 2 by RT PCR (hospital order, performed in [facility] hospital lab) *cepheid single result test* Anterior Nasal Swab     Status: None   Collection Time: 10/30/22  9:33 AM   Specimen: Anterior Nasal Swab  Result Value Ref Range Status   SARS Coronavirus 2 by RT PCR NEGATIVE NEGATIVE Final    Comment: (NOTE) SARS-CoV-2 target nucleic acids are NOT DETECTED.  The SARS-CoV-2 RNA is generally detectable in upper and lower respiratory specimens during the acute phase of infection. The lowest concentration of SARS-CoV-2 viral copies this assay can detect is 250 copies / mL. A negative result does not preclude SARS-CoV-2 infection and should not be used as the sole basis for treatment or other patient management decisions.  A negative result may occur with improper specimen collection / handling, submission of specimen other than nasopharyngeal swab, presence of viral mutation(s) within the areas targeted by this assay, and inadequate  number of viral copies (<250 copies / mL). A negative result must be combined with clinical observations, patient history, and epidemiological information.  Fact Sheet for Patients:   RoadLapTop.co.za  Fact Sheet for Healthcare Providers: http://kim-miller.com/  This test is not yet approved or  cleared by the [place] FDA and has been authorized for detection and/or diagnosis of SARS-CoV-2 by FDA under an Emergency Use Authorization (EUA).  This EUA will remain in effect (meaning this test can be used) for the duration of the COVID-19 declaration under Section 564(b)(1) of the Act, 21 U.S.C. section 360bbb-3(b)(1), unless the authorization is terminated or revoked sooner.  Performed at [facility], [address] [place], [place] [address]     Labs: CBC: Recent Labs  Lab 10/29/22 1826 10/30/22 0530  WBC 4.9 4.8  NEUTROABS 1.2*  --   HGB 8.4* 7.4*  HCT 29.9* 25.7*  MCV 90.6 89.5  PLT 76* 69*   Basic Metabolic Panel: Recent Labs  Lab 10/29/22 1826 10/30/22 0530  NA 137 139  K 3.5 4.0  CL 100 103  CO2 27 28  GLUCOSE 140* 131*  BUN 18 16  CREATININE 1.37* 1.29*  CALCIUM 9.2 8.8*   Liver Function Tests: Recent Labs  Lab 10/29/22 1826 10/30/22 0530  AST 22 18  ALT 10 9  ALKPHOS 95 80  BILITOT 0.7 0.7  PROT 8.4* 7.2  ALBUMIN 2.8* 2.4*   CBG: Recent Labs  Lab 10/30/22 1206 10/30/22 1650 10/30/22 2133 10/31/22 0828 10/31/22 0841  GLUCAP 146* 130* 161* 67* 78    Discharge time spent: greater than 30 minutes.  This record has been created using Conservation officer, historic buildings. Errors have been sought and corrected,but may not always be located. Such creation errors do not reflect on the standard of care.   Signed: Arnetha Courser, MD [facility] 10/31/2022

## 2022-10-31 NOTE — Care Management Important Message (Signed)
Important Message  Patient Details  Name: KNUT ZAMANI MRN: [id number] Date of Birth: December 30, 1945   Medicare Important Message Given:  N/A - LOS <3 / Initial given by admissions  Medicare IM initially reviewed with patient on 10/30/22 at 4:16am by ED Patient Access staff.  Explained Medicare IM and right to appeal discharge with spouse, relayed number to call for discharge appeal.     [name] 10/31/2022, 12:57 PM

## 2022-10-31 NOTE — Telephone Encounter (Signed)
Attempted to call the patient's wife. No answer- the phone rang and sounded like it was picked up but no one was speaking.  Will call back at a later time.

## 2022-11-01 DIAGNOSIS — I10 Essential (primary) hypertension: Secondary | ICD-10-CM | POA: Diagnosis not present

## 2022-11-01 DIAGNOSIS — S91302A Unspecified open wound, left foot, initial encounter: Secondary | ICD-10-CM | POA: Diagnosis not present

## 2022-11-01 DIAGNOSIS — N3000 Acute cystitis without hematuria: Secondary | ICD-10-CM | POA: Diagnosis not present

## 2022-11-01 DIAGNOSIS — E118 Type 2 diabetes mellitus with unspecified complications: Secondary | ICD-10-CM | POA: Diagnosis not present

## 2022-11-01 LAB — URINE CULTURE: Culture: >=100000 — AB

## 2022-11-01 LAB — GLUCOSE, CAPILLARY
Glucose-Capillary: 61 mg/dL — ABNORMAL LOW (ref 70–99)
Glucose-Capillary: 73 mg/dL (ref 70–99)
Glucose-Capillary: 89 mg/dL (ref 70–99)
Glucose-Capillary: 112 mg/dL — ABNORMAL HIGH (ref 70–99)
Glucose-Capillary: 157 mg/dL — ABNORMAL HIGH (ref 70–99)
Glucose-Capillary: 152 mg/dL — ABNORMAL HIGH (ref 70–99)

## 2022-11-01 MED ORDER — INSULIN GLARGINE-YFGN 100 UNIT/ML ~~LOC~~ SOLN
25.0000 [IU] | Freq: Every day | SUBCUTANEOUS | Status: DC
  Administered 2022-11-01: 25 [IU] via SUBCUTANEOUS
  Filled 2022-11-01 (×2): qty 0.25

## 2022-11-01 NOTE — Inpatient Diabetes Management (Signed)
Inpatient Diabetes Program Recommendations  AACE/ADA: New Consensus Statement on Inpatient Glycemic Control  Target Ranges:  Prepandial:   less than 140 mg/dL      Peak postprandial:   less than 180 mg/dL (1-2 hours)      Critically ill patients:  140 - 180 mg/dL    Latest Reference Range & Units 10/31/22 08:28 10/31/22 08:41 10/31/22 17:01 10/31/22 21:52 11/01/22 08:04 11/01/22 08:29  Glucose-Capillary 70 - 99 mg/dL 67 (L) 78 098 (H) 119 (H) 61 (L) 73   Review of Glycemic Control  Diabetes history: DM2 Outpatient Diabetes medications: Lantus 40 units QPM, Novolog 0-15 units TID with meals, Novolog 0-5 units QHS Current orders for Inpatient glycemic control: Semglee 30 units at bedtime, Novolog 0-15 units TID with meals, Novolog 0-5 units QHS   Inpatient Diabetes Program Recommendations:     Insulin: Fasting CBG 61 mg/dl today. Please consider decreasing Semglee to 20 units at bedtime.   Thanks, Orlando Penner, RN, MSN, CDCES Diabetes Coordinator Inpatient Diabetes Program [phone] (Team Pager from 8am to 5pm)

## 2022-11-01 NOTE — Assessment & Plan Note (Signed)
Seems well-controlled with last A1c of 5.4 in June 2024. 1 episode of mild hypoglycemia which responded to orange juice -Holding home Marcelline Deist due to UTI -Continue with home dose of Semglee and SSI -Decreasing the dose of Semglee to 20 units at bedtime

## 2022-11-01 NOTE — TOC Progression Note (Signed)
Transition of Care [facility]) - Progression Note    Patient Details  Name: Robert [name] MRN: [id number] Date of Birth: 03-16-1945  [facility]) CM/SW Contact  Chapman Fitch, RN Phone Number: 11/01/2022, 3:19 PM  Clinical Narrative:    Appeal of discharge pending    Expected Discharge Plan: Home w Home Health Services Barriers to Discharge: No Barriers Identified  Expected Discharge Plan and Services       Living arrangements for the past 2 months: Single Family Home Expected Discharge Date: 10/31/22                           [facility] Agency: [facility] Date HH Agency Contacted: 10/31/22       Social Determinants of Health (SDOH) Interventions SDOH Screenings   Food Insecurity: No Food Insecurity (10/30/2022)  Housing: Low Risk  (10/30/2022)  Transportation Needs: No Transportation Needs (10/30/2022)  Utilities: Not At Risk (10/30/2022)  Alcohol Screen: Low Risk  (10/29/2020)  Depression (PHQ2-9): Low Risk  (09/12/2021)  Financial Resource Strain: Low Risk  (10/29/2020)  Physical Activity: Inactive (10/29/2020)  Social Connections: Moderately Isolated (10/29/2020)  Stress: No Stress Concern Present (10/29/2020)  Tobacco Use: Low Risk  (10/30/2022)    Readmission Risk Interventions     No data to display

## 2022-11-01 NOTE — Assessment & Plan Note (Signed)
Patient with dysuria, no leukocytosis or fever.  UA consistent with UTI. Urine cultures with MSSA -Ceftriaxone switched with Keflex

## 2022-11-01 NOTE — Plan of Care (Signed)
  Problem: Coping: Goal: Ability to adjust to condition or change in health will improve Outcome: Progressing   Problem: Fluid Volume: Goal: Ability to maintain a balanced intake and output will improve Outcome: Progressing   Problem: Health Behavior/Discharge Planning: Goal: Ability to identify and utilize available resources and services will improve Outcome: Progressing   Problem: Health Behavior/Discharge Planning: Goal: Ability to identify and utilize available resources and services will improve Outcome: Progressing   Problem: Metabolic: Goal: Ability to maintain appropriate glucose levels will improve Outcome: Progressing

## 2022-11-01 NOTE — Progress Notes (Signed)
Progress Note   Patient: Robert [name]:[id number] DOB: Feb 05, 1946 DOA: 10/29/2022     3 DOS: the patient was seen and examined on 11/01/2022   Brief hospital course: Taken from H&P.  LYNX MURZYN is a 77 y.o. male with medical history significant of type 2 diabetes, essential hypertension, hyperlipidemia, peripheral vascular disease, morbid obesity who presented to the ER with generalized weakness, dysuria, urinary frequency.   On presentation vital stable, afebrile, labs seems stable around baseline, UA positive for protein, nitrite, pyuria and rare bacteria-urine cultures were sent and he was started on ceftriaxone.  9/9: Vital stable, some decrease of hemoglobin to 7.4, no obvious bleeding, also Decreased so are likely some dilutional effect.  COVID-19 PCR negative.  Patient with a chronic wound with surrounding signs of cellulitis on left 3 toes, imaging negative for any osteo, did show some arthritis.  Starting on Doxy with ceftriaxone and consulted wound care.  Pending urine culture and PT evaluation.  Patient is mostly wheelchair-bound at baseline, was in rehab last month and would like to go home with home health services. Recent anemia panel consistent with anemia of chronic disease and mild iron deficiency-starting on iron supplement.  B12 and folate was normal.  9/10: Patient appears to be at baseline.  Physical therapist recommended home health services.  We ordered maximum home health services pending insurance approval as wife is unable to provide much assistance.  Patient is wheelchair-bound at baseline. Urine cultures are still pending, patient was given 3 more days of Keflex to complete the course. He was also given doxycycline for 1 week for left foot wound and some surrounding cellulitis. Dressing change instructions were provided. We stopped home [place] for concern of UTI.  Patient will continue the rest of his home medications and need to have a close  follow-up with his providers for further recommendations.  9/11: Remained hemodynamically stable, he was discharged yesterday but wife appealed the discharge.  Urine culture with MSSA, patient is currently on Keflex and doxycycline for UTI and concern of right foot cellulitis and a small wound.  Patient remained medically stable for discharge.  [facility] health services were ordered yesterday.  Assessment and Plan: * UTI (urinary tract infection) Patient with dysuria, no leukocytosis or fever.  UA consistent with UTI. Urine cultures with MSSA -Ceftriaxone switched with Keflex  Open wound of left foot Patient has left first 3 toes small partial thickness wound with surrounding erythema and edema consistent with cellulitis for the past 1 month, stating that he had some injury and then wound never healed.  Left foot imaging negative for any osteomyelitis. -Add doxycycline -Wound care consult  Type 2 diabetes mellitus with complication (HCC) Seems well-controlled with last A1c of 5.4 in June 2024. 1 episode of mild hypoglycemia which responded to orange juice -Holding home Marcelline Deist due to UTI -Continue with home dose of Semglee and SSI -Decreasing the dose of Semglee to 20 units at bedtime  Essential hypertension Blood pressure within goal. -Continue home metoprolol and Lasix  Chronic venous insufficiency Seems stable. -Continue home Lasix  Normocytic anemia Hemoglobin decreased to 7.4 but all cell lines decreased, likely some dilutional effect. Anemia panel done in July 2024 with concern of anemia of chronic disease with some iron deficiency.  B12 and folate was normal. -Starting iron supplement -Monitor hemoglobin -Transfuse if below 7  Thrombocytopenia (HCC) Seems chronic, with slow worsening. -Outpatient follow-up with hematology  Stage 3a chronic kidney disease (HCC) Renal function seems stable. -Monitor  renal function -Avoid nephrotoxins  Hyperlipidemia -Continue  home statin  Obesity (BMI 30-39.9) Estimated body mass index is 36.21 kg/m as calculated from the following:   Height as of this encounter: 6\' 2"  (1.88 m).   Weight as of this encounter: 127.9 kg.   -Encouraged weight loss  Chronic respiratory failure with hypoxia (HCC) Stable on baseline oxygen requirement of 2 L. -Continue supplemental oxygen   Subjective: Patient was seen and examined today.  No new concern.  Physical Exam: Vitals:   10/31/22 0839 10/31/22 2004 11/01/22 0354 11/01/22 0804  BP: (!) 151/83 (!) 146/82 132/75 (!) 146/84  Pulse: 77 84 79 80  Resp: 18 20 20 18   Temp: 97.8 F (36.6 C) 97.6 F (36.4 C) (!) 97.4 F (36.3 C) 98.2 F (36.8 C)  TempSrc:  Oral Oral Oral  SpO2: 100% 100% 100% 100%  Weight:      Height:       General.  Obese gentleman, in no acute distress. Pulmonary.  Lungs clear bilaterally, normal respiratory effort. CV.  Regular rate and rhythm, no JVD, rub or murmur. Abdomen.  Soft, nontender, nondistended, BS positive. CNS.  Alert and oriented .  No focal neurologic deficit. Extremities.  No edema, no cyanosis, pulses intact and symmetrical.  Left foot with bandage Psychiatry.  Judgment and insight appears normal.        Data Reviewed: Prior data reviewed  Family Communication:   Disposition: Status is: Inpatient Remains inpatient appropriate because: Severity of illness  Planned Discharge Destination: Home with Home Health  Time spent: 38 minutes  This record has been created using Conservation officer, historic buildings. Errors have been sought and corrected,but may not always be located. Such creation errors do not reflect on the standard of care.   Author: Arnetha Courser, MD 11/01/2022 2:29 PM  For on call review [email or web address].

## 2022-11-01 NOTE — TOC Progression Note (Addendum)
Transition of Care [facility]) - Progression Note    Patient Details  Name: Robert [name] MRN: [id number] Date of Birth: January 05, 1946  Transition of Care [facility]) CM/SW Contact  Chapman Fitch, RN Phone Number: 11/01/2022, 5:20 PM  Clinical Narrative:      Notified by Olegario Messier with TOC that Appeal was denied and financial Liability begins 11/02/22  I spoke with wife mrs Galligher, and she states that she has not received a call for her to be notified of the appeal status.   She is aware that financial liability starts tomorrow at noon  She is requesting that the patient not leave until Friday.  She states that she has someone coming in from [facility] on Friday at 11 am that could assist.   Wife states that she is calling appeal line to see if there is an option for reconsideration  Update 525: Received return call from wife and she does not want to reconsider and is agreeable for patient to return home tomorrow before noon.   She states that she is not going to be able to get him into the home and request EMS transport.  She is aware that she will need to pick up his RW prior to EMS transport  Update 533:  Received return call from wife . She is now calling the appeal line to request reconsideration, and states that at discharge she will be providing transport and bring portable O2  Update 540:  Wife called again and states she is not going to do the reconsideration and will be at the hospital to pick patient up before 12 pm tomorrow  Expected Discharge Plan: Home w Home Health Services Barriers to Discharge: No Barriers Identified  Expected Discharge Plan and Services       Living arrangements for the past 2 months: Single Family Home Expected Discharge Date: 10/31/22                           [facility] Date HH Agency Contacted: 10/31/22       Social Determinants of Health (SDOH) Interventions SDOH Screenings   Food Insecurity: No  Food Insecurity (10/30/2022)  Housing: Low Risk  (10/30/2022)  Transportation Needs: No Transportation Needs (10/30/2022)  Utilities: Not At Risk (10/30/2022)  Alcohol Screen: Low Risk  (10/29/2020)  Depression (PHQ2-9): Low Risk  (09/12/2021)  Financial Resource Strain: Low Risk  (10/29/2020)  Physical Activity: Inactive (10/29/2020)  Social Connections: Moderately Isolated (10/29/2020)  Stress: No Stress Concern Present (10/29/2020)  Tobacco Use: Low Risk  (10/30/2022)    Readmission Risk Interventions     No data to display

## 2022-11-02 LAB — GLUCOSE, CAPILLARY
Glucose-Capillary: 96 mg/dL (ref 70–99)
Glucose-Capillary: 134 mg/dL — ABNORMAL HIGH (ref 70–99)

## 2022-11-02 NOTE — TOC Transition Note (Signed)
Transition of Care [facility]) - CM/SW Discharge Note   Patient Details  Name: Robert [name] MRN: [id number] Date of Birth: [birth year]  Transition of Care [facility]) CM/SW Contact:  Chapman Fitch, RN Phone Number: 11/02/2022, 9:01 AM   Clinical Narrative:    Wife confirms she will be picking up patient at 11am today.  She will be brining his portable O2 Jason with [facility] notified of discharge    Final next level of care: Home/Self Care Barriers to Discharge: No Barriers Identified   Patient Goals and CMS Choice CMS Medicare.gov Compare Post Acute Care list provided to:: Patient Represenative (must comment) (wife)    Discharge Placement                         Discharge Plan and Services Additional resources added to the After Visit Summary for                              [facility] Agency: [facility] Date [facility] Agency Contacted: 10/31/22      Social Determinants of Health (SDOH) Interventions SDOH Screenings   Food Insecurity: No Food Insecurity (10/30/2022)  Housing: Low Risk  (10/30/2022)  Transportation Needs: No Transportation Needs (10/30/2022)  Utilities: Not At Risk (10/30/2022)  Alcohol Screen: Low Risk  (10/29/2020)  Depression (PHQ2-9): Low Risk  (09/12/2021)  Financial Resource Strain: Low Risk  (10/29/2020)  Physical Activity: Inactive (10/29/2020)  Social Connections: Moderately Isolated (10/29/2020)  Stress: No Stress Concern Present (10/29/2020)  Tobacco Use: Low Risk  (10/30/2022)     Readmission Risk Interventions     No data to display

## 2022-11-02 NOTE — Progress Notes (Signed)
Discharge instructions were reviewed with patient. IV was taken out. Belongings collected.

## 2022-11-02 NOTE — Progress Notes (Signed)
Mobility Specialist - Progress Note   11/02/22 1036  Mobility  Activity Stood at bedside;Transferred from bed to chair;Transferred from chair to bed;Ambulated with assistance in room  Level of Assistance Moderate assist, patient does 50-74%  Assistive Device Front wheel walker;Four wheel walker  Distance Ambulated (ft) 6 ft  Range of Motion/Exercises Active;Right leg;Left leg  Activity Response Tolerated well  $Mobility charge 1 Mobility    During mobility: 116 HR, 97% SpO2   Pt sitting in recliner upon arrival, utilizing 2L. Pt voiced weakness in BLE. Participated in seated LE therex to prep for standing. STS x5 from recliner with min-modA depending on level of fatigue. Short rest breaks in between bouts. Pt step-pivot (minG) to bed to practice transfers and STS from bed-level with modA +1 to stand. VC for hand placement and use of momentum. Fatigues quickly which prompts pt to sit quickly in an slightly unsafe manner. Pt educated on energy conservation techniques and use of 4WW vs RW. Pt returned to recliner with alarm set, needs in reach. RN notified.    Filiberto Pinks Mobility Specialist 11/02/22, 10:45 AM

## 2022-11-08 NOTE — Telephone Encounter (Signed)
Called pt's wife regarding previous message. Wife stated she was able to get her questions answered and has no further questions at this time.

## 2023-01-07 ENCOUNTER — Other Ambulatory Visit: Payer: Self-pay

## 2023-01-07 DIAGNOSIS — R4182 Altered mental status, unspecified: Secondary | ICD-10-CM | POA: Insufficient documentation

## 2023-01-07 DIAGNOSIS — N390 Urinary tract infection, site not specified: Secondary | ICD-10-CM | POA: Insufficient documentation

## 2023-01-07 DIAGNOSIS — Z85048 Personal history of other malignant neoplasm of rectum, rectosigmoid junction, and anus: Secondary | ICD-10-CM | POA: Insufficient documentation

## 2023-01-07 LAB — CBC
HCT: 31.2 % — ABNORMAL LOW (ref 39.0–52.0)
Hemoglobin: 9.6 g/dL — ABNORMAL LOW (ref 13.0–17.0)
MCH: 28.1 pg (ref 26.0–34.0)
MCHC: 30.8 g/dL (ref 30.0–36.0)
MCV: 91.2 fL (ref 80.0–100.0)
Platelets: 47 10*3/uL — ABNORMAL LOW (ref 150–400)
RBC: 3.42 MIL/uL — ABNORMAL LOW (ref 4.22–5.81)
RDW: 18 % — ABNORMAL HIGH (ref 11.5–15.5)
WBC: 2.8 10*3/uL — ABNORMAL LOW (ref 4.0–10.5)
nRBC: 0 % (ref 0.0–0.2)

## 2023-01-07 LAB — COMPREHENSIVE METABOLIC PANEL
ALT: 12 U/L (ref 0–44)
AST: 25 U/L (ref 15–41)
Albumin: 2.1 g/dL — ABNORMAL LOW (ref 3.5–5.0)
Alkaline Phosphatase: 68 U/L (ref 38–126)
Anion gap: 7 (ref 5–15)
BUN: 19 mg/dL (ref 8–23)
CO2: 25 mmol/L (ref 22–32)
Calcium: 8.4 mg/dL — ABNORMAL LOW (ref 8.9–10.3)
Chloride: 103 mmol/L (ref 98–111)
Creatinine, Ser: 1.71 mg/dL — ABNORMAL HIGH (ref 0.61–1.24)
GFR, Estimated: 40 mL/min — ABNORMAL LOW (ref >60)
Glucose, Bld: 169 mg/dL — ABNORMAL HIGH (ref 70–99)
Potassium: 4.2 mmol/L (ref 3.5–5.1)
Sodium: 135 mmol/L (ref 135–145)
Total Bilirubin: 0.6 mg/dL (ref <1.2)
Total Protein: 8.1 g/dL (ref 6.5–8.1)

## 2023-01-07 LAB — LIPASE, BLOOD: Lipase: 40 U/L (ref 11–51)

## 2023-01-07 NOTE — ED Triage Notes (Addendum)
Pt to ed from [facility] via [employer]. EMS was called out for altered mental status. Pt was caox4, and in no acute distress. The wife wanted him to be sent to the ER to have his labs drawn, for unknown reason. Pt has only been at [facility] for 2 days so staff doesn't know much about him. He was DC from [facility] and sent there.   BGL 169 140/76 97%  77 HR  Pt has no physical complaints at this time.

## 2023-01-08 ENCOUNTER — Emergency Department
Admission: EM | Admit: 2023-01-08 | Discharge: 2023-01-08 | Disposition: A | Discharge status: Skilled Nursing Facility | Payer: Medicare Other | Attending: Emergency Medicine | Admitting: Emergency Medicine

## 2023-01-08 ENCOUNTER — Emergency Department: Payer: Medicare Other

## 2023-01-08 DIAGNOSIS — N390 Urinary tract infection, site not specified: Secondary | ICD-10-CM

## 2023-01-08 LAB — URINALYSIS, ROUTINE W REFLEX MICROSCOPIC
Bilirubin Urine: NEGATIVE
Glucose, UA: NEGATIVE mg/dL
Ketones, ur: NEGATIVE mg/dL
Leukocytes,Ua: NEGATIVE
Nitrite: NEGATIVE
Protein, ur: 30 mg/dL — AB
RBC / HPF: >50 RBC/hpf (ref 0–5)
Specific Gravity, Urine: 1.014 (ref 1.005–1.030)
Squamous Epithelial / HPF: 0 /[HPF] (ref 0–5)
WBC, UA: >50 WBC/hpf (ref 0–5)
pH: 6 (ref 5.0–8.0)

## 2023-01-08 MED ORDER — CEPHALEXIN 500 MG PO CAPS: 500.0000 mg | ORAL_CAPSULE | Freq: Four times a day (QID) | ORAL | 0 refills | Status: AC

## 2023-01-08 MED ORDER — CEPHALEXIN 500 MG PO CAPS: 500.0000 mg | ORAL_CAPSULE | Freq: Four times a day (QID) | ORAL | 0 refills | Status: DC

## 2023-01-08 MED ORDER — CEPHALEXIN 500 MG PO CAPS
500.0000 mg | ORAL_CAPSULE | Freq: Once | ORAL | Status: AC
  Administered 2023-01-08: 500 mg via ORAL
  Filled 2023-01-08: qty 1

## 2023-01-08 NOTE — ED Provider Notes (Signed)
[facility] Provider Note    Event Date/Time   First MD Initiated Contact with Patient 01/08/23 [phone]     (approximate)   History   Labs Only (Per wife request)   HPI  Robert [name] is a 77 y.o. male   Past medical history of multiple medical comorbidities who presents to the emergency department for brief episode of altered mental status/agitation.  Reportedly from [facility] was altered, angry, and the patient states that he was angry because he was concerned for the safety of his wife for an unknown reason.  He admits now that this thought was likely unreasonable, and is oriented alert comfortable with no acute medical complaints now.  I spoke to his wife on the phone who noted that he appeared more confused than usual yesterday when she visited with him.  Otherwise both he and she note no other acute medical complaints, no infectious symptoms recently, no trauma.  Independent Historian contributed to assessment above: His wife corroborates information past medical history as above  External Medical Documents Reviewed: Recent hospitalization note from earlier in November 2024 for sigmoid cancer status post resection      Physical Exam   Triage Vital Signs: ED Triage Vitals  Encounter Vitals Group     BP 01/07/23 2110 122/71     Systolic BP Percentile --      Diastolic BP Percentile --      Pulse Rate 01/07/23 2110 77     Resp 01/07/23 2110 18     Temp 01/07/23 2110 98 F (36.7 C)     Temp Source 01/07/23 2110 Oral     SpO2 01/07/23 2110 99 %     Weight 01/07/23 2111 265 lb (120.2 kg)     Height 01/07/23 2111 6\' 2"  (1.88 m)     Head Circumference --      Peak Flow --      Pain Score 01/07/23 2111 0     Pain Loc --      Pain Education --      Exclude from Growth Chart --     Most recent vital signs: Vitals:   01/08/23 0530 01/08/23 0629  BP: (!) 153/79   Pulse: 76 70  Resp: (!) 22 14  Temp:    SpO2: 97% 99%     General: Awake, no distress.  CV:  Good peripheral perfusion.  Resp:  Normal effort.  Abd:  No distention.  Other:  Pleasant gentleman in no acute distress.  His abdomen has surgical scars that are well-healing and soft benign abdominal exam.  He is moving all extremities full active range of motion no dysarthria no facial asymmetry and is answering questions appropriately.  He is awake alert and oriented.  His lungs are clear to auscultation.  His skin appears warm well-perfused.   ED Results / Procedures / Treatments   Labs (all labs ordered are listed, but only abnormal results are displayed) Labs Reviewed  COMPREHENSIVE METABOLIC PANEL - Abnormal; Notable for the following components:      Result Value   Glucose, Bld 169 (*)    Creatinine, Ser 1.71 (*)    Calcium 8.4 (*)    Albumin 2.1 (*)    GFR, Estimated 40 (*)    All other components within normal limits  CBC - Abnormal; Notable for the following components:   WBC 2.8 (*)    RBC 3.42 (*)    Hemoglobin 9.6 (*)    HCT  31.2 (*)    RDW 18.0 (*)    Platelets 47 (*)    All other components within normal limits  URINALYSIS, ROUTINE W REFLEX MICROSCOPIC - Abnormal; Notable for the following components:   Color, Urine YELLOW (*)    APPearance CLOUDY (*)    Hgb urine dipstick LARGE (*)    Protein, ur 30 (*)    Bacteria, UA MANY (*)    All other components within normal limits  URINE CULTURE  LIPASE, BLOOD     I ordered and reviewed the above labs they are notable for all 3 cell lines are decreased but not markedly changed from prior testing.    RADIOLOGY I independently reviewed and interpreted CT of the head and see no obvious bleeding or midline shift I also reviewed radiologist's formal read.   PROCEDURES:  Critical Care performed: No  Procedures   MEDICATIONS ORDERED IN ED: Medications  cephALEXin (KEFLEX) capsule 500 mg (500 mg Oral Given 01/08/23 0718)    IMPRESSION / MDM / ASSESSMENT AND PLAN /  ED COURSE  I reviewed the triage vital signs and the nursing notes.                                Patient's presentation is most consistent with acute presentation with potential threat to life or bodily function.  Differential diagnosis includes, but is not limited to, altered mental status due to metabolic or infectious encephalopathy, considered but less likely CVA, ICH   The patient is on the cardiac monitor to evaluate for evidence of arrhythmia and/or significant heart rate changes.  MDM:    Brief episode of agitation/altered mental status mild confusion noted by the wife yesterday but the patient looks well with no acute medical complaints despite his multiple medical comorbidities and recent history of surgery for colon cancer.  His abdomen is benign I doubt there is a intra-abdominal surgical pathology going on at this time, he did not hit his head or suffer any trauma and his CT scan of the head looks normal.  His labs are abnormal but at baseline not markedly changed from prior testing and his urinalysis does show evidence of urinary tract infection so I will medicate him for that and discharge him back to [facility].        FINAL CLINICAL IMPRESSION(S) / ED DIAGNOSES   Final diagnoses:  Acute lower UTI (urinary tract infection)     Rx / DC Orders   ED Discharge Orders          Ordered    cephALEXin (KEFLEX) 500 MG capsule  4 times daily,   Status:  Discontinued        01/08/23 0634    cephALEXin (KEFLEX) 500 MG capsule  4 times daily        01/08/23 0815             Note:  This document was prepared using Dragon voice recognition software and may include unintentional dictation errors.    Pilar Jarvis, MD 01/08/23 [phone]

## 2023-01-08 NOTE — ED Notes (Signed)
[facility] called for transport To [facility] care facility

## 2023-01-08 NOTE — ED Notes (Signed)
Report given Tamala Ser LPN at [facility]. She requested his prescription be sent to the pharmacy that they have on campus.

## 2023-01-08 NOTE — ED Notes (Signed)
Report given.

## 2023-01-08 NOTE — Discharge Instructions (Addendum)
You have evidence of a urinary tract infection for which you should take the full 7-day course of antibiotics as prescribed.  Thank you for choosing [place] for your health care today!  Please see your primary doctor this week for a follow up appointment.   If you have any new, worsening, or unexpected symptoms call your doctor right away or come back to the emergency department for reevaluation.  It was my pleasure to care for you today.   Daneil Dan Modesto Charon, MD

## 2023-01-12 LAB — CARBAPENEM RESISTANCE PANEL
Carba Resistance IMP Gene: NOT DETECTED
Carba Resistance KPC Gene: NOT DETECTED
Carba Resistance NDM Gene: NOT DETECTED
Carba Resistance OXA48 Gene: NOT DETECTED
Carba Resistance VIM Gene: NOT DETECTED

## 2023-01-13 NOTE — Progress Notes (Signed)
ED Antimicrobial Stewardship Positive Culture Follow Up   Robert [name] is an 77 y.o. male who presented to [facility] on 01/08/2023 with a chief complaint of  Chief Complaint  Patient presents with   Labs Only    Per wife request   Recent Results (from the past 720 hour(s))  Urine Culture (for pregnant, neutropenic or urologic patients or patients with an indwelling urinary catheter)     Status: Abnormal   Collection Time: 01/08/23  5:32 AM   Specimen: Urine, Clean Catch  Result Value Ref Range Status   Specimen Description   Final    URINE, CLEAN CATCH Performed at [facility], [address], [place], [place] [address]    Special Requests   Final    NONE Performed at [facility], [address] [place], [place] [address]    Culture (A)  Final    90,000 COLONIES/mL KLEBSIELLA PNEUMONIAE Confirmed Extended Spectrum Beta-Lactamase Producer (ESBL).  In bloodstream infections from ESBL organisms, carbapenems are preferred over piperacillin/tazobactam. They are shown to have a lower risk of mortality.    Report Status 01/12/2023 FINAL  Final   Organism ID, Bacteria KLEBSIELLA PNEUMONIAE (A)  Final      Susceptibility   Klebsiella pneumoniae - MIC*    AMPICILLIN >=32 RESISTANT Resistant     CEFAZOLIN >=64 RESISTANT Resistant     CEFEPIME >=32 RESISTANT Resistant     CEFTRIAXONE >=64 RESISTANT Resistant     CIPROFLOXACIN >=4 RESISTANT Resistant     GENTAMICIN <=1 SENSITIVE Sensitive     IMIPENEM 0.5 SENSITIVE Sensitive     NITROFURANTOIN 256 RESISTANT Resistant     TRIMETH/SULFA >=320 RESISTANT Resistant     AMPICILLIN/SULBACTAM >=32 RESISTANT Resistant     PIP/TAZO >=128 RESISTANT Resistant ug/mL    * 90,000 COLONIES/mL KLEBSIELLA PNEUMONIAE  Carbapenem Resistance Panel     Status: None   Collection Time: 01/08/23  2:30 PM  Result Value Ref Range Status   Carba Resistance IMP Gene NOT DETECTED NOT DETECTED Final   Carba Resistance VIM Gene NOT  DETECTED NOT DETECTED Final   Carba Resistance NDM Gene NOT DETECTED NOT DETECTED Final   Carba Resistance KPC Gene NOT DETECTED NOT DETECTED Final   Carba Resistance OXA48 Gene NOT DETECTED NOT DETECTED Final    Comment: (NOTE) Cepheid Carba-R is an FDA-cleared nucleic acid amplification test  (NAAT)for the detection and differentiation of genes encoding the  most prevalent carbapenemases in bacterial isolate samples. Carbapenemase gene identification and implementation of comprehensive  infection control measures are recommended by the CDC to prevent the  spread of the resistant organisms. Performed at [facility], [address] [place], [place] [address]    [x]  Treated with Keflex, organism resistant to prescribed antimicrobial []  Patient discharged originally without antimicrobial agent and treatment is now indicated  New antibiotic prescription: Attempted to call patient to see if improving but did not answer - if not would need IV antibiotics. Will attempt to reach patient 2 more times.   ED Provider: Dr. Cherrie Distance, PharmD Pharmacy Resident  01/13/2023 2:56 PM.

## 2023-01-27 ENCOUNTER — Other Ambulatory Visit: Payer: Self-pay | Admitting: Nurse Practitioner

## 2023-02-05 ENCOUNTER — Other Ambulatory Visit: Payer: Self-pay

## 2023-02-05 DIAGNOSIS — Z751 Person awaiting admission to adequate facility elsewhere: Secondary | ICD-10-CM

## 2023-02-05 DIAGNOSIS — Z8249 Family history of ischemic heart disease and other diseases of the circulatory system: Secondary | ICD-10-CM

## 2023-02-05 DIAGNOSIS — Z6835 Body mass index (BMI) 35.0-35.9, adult: Secondary | ICD-10-CM

## 2023-02-05 DIAGNOSIS — D61818 Other pancytopenia: Principal | ICD-10-CM | POA: Diagnosis present

## 2023-02-05 DIAGNOSIS — K922 Gastrointestinal hemorrhage, unspecified: Secondary | ICD-10-CM | POA: Diagnosis not present

## 2023-02-05 DIAGNOSIS — R1310 Dysphagia, unspecified: Secondary | ICD-10-CM | POA: Diagnosis present

## 2023-02-05 DIAGNOSIS — E1122 Type 2 diabetes mellitus with diabetic chronic kidney disease: Secondary | ICD-10-CM | POA: Diagnosis present

## 2023-02-05 DIAGNOSIS — E1151 Type 2 diabetes mellitus with diabetic peripheral angiopathy without gangrene: Secondary | ICD-10-CM | POA: Diagnosis present

## 2023-02-05 DIAGNOSIS — E876 Hypokalemia: Secondary | ICD-10-CM | POA: Diagnosis not present

## 2023-02-05 DIAGNOSIS — I5032 Chronic diastolic (congestive) heart failure: Secondary | ICD-10-CM | POA: Diagnosis present

## 2023-02-05 DIAGNOSIS — Z794 Long term (current) use of insulin: Secondary | ICD-10-CM

## 2023-02-05 DIAGNOSIS — R195 Other fecal abnormalities: Secondary | ICD-10-CM | POA: Diagnosis present

## 2023-02-05 DIAGNOSIS — N1831 Chronic kidney disease, stage 3a: Secondary | ICD-10-CM | POA: Diagnosis present

## 2023-02-05 DIAGNOSIS — Z9049 Acquired absence of other specified parts of digestive tract: Secondary | ICD-10-CM

## 2023-02-05 DIAGNOSIS — K5900 Constipation, unspecified: Secondary | ICD-10-CM | POA: Diagnosis present

## 2023-02-05 DIAGNOSIS — D62 Acute posthemorrhagic anemia: Principal | ICD-10-CM | POA: Diagnosis present

## 2023-02-05 DIAGNOSIS — I130 Hypertensive heart and chronic kidney disease with heart failure and stage 1 through stage 4 chronic kidney disease, or unspecified chronic kidney disease: Secondary | ICD-10-CM | POA: Diagnosis present

## 2023-02-05 DIAGNOSIS — Z85038 Personal history of other malignant neoplasm of large intestine: Secondary | ICD-10-CM

## 2023-02-05 DIAGNOSIS — E785 Hyperlipidemia, unspecified: Secondary | ICD-10-CM | POA: Diagnosis present

## 2023-02-05 DIAGNOSIS — Z823 Family history of stroke: Secondary | ICD-10-CM

## 2023-02-05 DIAGNOSIS — D696 Thrombocytopenia, unspecified: Secondary | ICD-10-CM | POA: Diagnosis present

## 2023-02-05 DIAGNOSIS — Z79899 Other long term (current) drug therapy: Secondary | ICD-10-CM

## 2023-02-05 DIAGNOSIS — R3129 Other microscopic hematuria: Secondary | ICD-10-CM | POA: Diagnosis present

## 2023-02-05 LAB — BASIC METABOLIC PANEL
Anion gap: 7 (ref 5–15)
BUN: 25 mg/dL — ABNORMAL HIGH (ref 8–23)
CO2: 26 mmol/L (ref 22–32)
Calcium: 8.4 mg/dL — ABNORMAL LOW (ref 8.9–10.3)
Chloride: 100 mmol/L (ref 98–111)
Creatinine, Ser: 1.6 mg/dL — ABNORMAL HIGH (ref 0.61–1.24)
GFR, Estimated: 44 mL/min — ABNORMAL LOW (ref >60)
Glucose, Bld: 150 mg/dL — ABNORMAL HIGH (ref 70–99)
Potassium: 3.9 mmol/L (ref 3.5–5.1)
Sodium: 133 mmol/L — ABNORMAL LOW (ref 135–145)

## 2023-02-05 LAB — CBC
HCT: 24.9 % — ABNORMAL LOW (ref 39.0–52.0)
Hemoglobin: 7.4 g/dL — ABNORMAL LOW (ref 13.0–17.0)
MCH: 28 pg (ref 26.0–34.0)
MCHC: 29.7 g/dL — ABNORMAL LOW (ref 30.0–36.0)
MCV: 94.3 fL (ref 80.0–100.0)
Platelets: 94 10*3/uL — ABNORMAL LOW (ref 150–400)
RBC: 2.64 MIL/uL — ABNORMAL LOW (ref 4.22–5.81)
RDW: 18.6 % — ABNORMAL HIGH (ref 11.5–15.5)
WBC: 4.6 10*3/uL (ref 4.0–10.5)
nRBC: 0.6 % — ABNORMAL HIGH (ref 0.0–0.2)

## 2023-02-05 NOTE — ED Provider Triage Note (Signed)
Emergency Medicine Provider Triage Evaluation Note  RAYDEL LATSKO , a 77 y.o. male  was evaluated in triage.  Pt complains of low hgb.  Review of Systems  Positive:  Negative:   Physical Exam  There were no vitals taken for this visit. Gen:   Awake, no distress   Resp:  Normal effort  MSK:   Moves extremities without difficulty  Other:    Medical Decision Making  Medically screening exam initiated at 6:41 PM.  Appropriate orders placed.  [name] was informed that the remainder of the evaluation will be completed by another provider, this initial triage assessment does not replace that evaluation, and the importance of remaining in the ED until their evaluation is complete.     Faythe Ghee, PA-C 02/05/23 [phone]

## 2023-02-05 NOTE — ED Triage Notes (Signed)
Pt here via [facility]. Went to [facility] today and Hgb was 6.9. States blood in urine. States he's felt weak lately. Denies SOB and CP

## 2023-02-06 ENCOUNTER — Emergency Department: Payer: Medicare Other

## 2023-02-06 ENCOUNTER — Inpatient Hospital Stay
Admission: EM | Admit: 2023-02-06 | Discharge: 2023-02-20 | DRG: 809 | Disposition: A | Discharge status: Skilled Nursing Facility | Payer: Medicare Other | Attending: Obstetrics and Gynecology | Admitting: Obstetrics and Gynecology

## 2023-02-06 DIAGNOSIS — D61818 Other pancytopenia: Secondary | ICD-10-CM | POA: Diagnosis present

## 2023-02-06 DIAGNOSIS — E1169 Type 2 diabetes mellitus with other specified complication: Secondary | ICD-10-CM | POA: Diagnosis not present

## 2023-02-06 DIAGNOSIS — Z794 Long term (current) use of insulin: Secondary | ICD-10-CM | POA: Diagnosis not present

## 2023-02-06 DIAGNOSIS — Z8249 Family history of ischemic heart disease and other diseases of the circulatory system: Secondary | ICD-10-CM | POA: Diagnosis not present

## 2023-02-06 DIAGNOSIS — N183 Chronic kidney disease, stage 3 unspecified: Secondary | ICD-10-CM | POA: Diagnosis not present

## 2023-02-06 DIAGNOSIS — Z79899 Other long term (current) drug therapy: Secondary | ICD-10-CM | POA: Diagnosis not present

## 2023-02-06 DIAGNOSIS — K922 Gastrointestinal hemorrhage, unspecified: Secondary | ICD-10-CM | POA: Diagnosis present

## 2023-02-06 DIAGNOSIS — D649 Anemia, unspecified: Principal | ICD-10-CM | POA: Insufficient documentation

## 2023-02-06 DIAGNOSIS — E785 Hyperlipidemia, unspecified: Secondary | ICD-10-CM | POA: Diagnosis present

## 2023-02-06 DIAGNOSIS — D696 Thrombocytopenia, unspecified: Secondary | ICD-10-CM | POA: Diagnosis present

## 2023-02-06 DIAGNOSIS — Z751 Person awaiting admission to adequate facility elsewhere: Secondary | ICD-10-CM | POA: Diagnosis not present

## 2023-02-06 DIAGNOSIS — D62 Acute posthemorrhagic anemia: Secondary | ICD-10-CM

## 2023-02-06 DIAGNOSIS — E876 Hypokalemia: Secondary | ICD-10-CM | POA: Diagnosis not present

## 2023-02-06 DIAGNOSIS — C187 Malignant neoplasm of sigmoid colon: Secondary | ICD-10-CM | POA: Diagnosis not present

## 2023-02-06 DIAGNOSIS — K5900 Constipation, unspecified: Secondary | ICD-10-CM | POA: Diagnosis present

## 2023-02-06 DIAGNOSIS — R195 Other fecal abnormalities: Secondary | ICD-10-CM | POA: Diagnosis present

## 2023-02-06 DIAGNOSIS — I5032 Chronic diastolic (congestive) heart failure: Secondary | ICD-10-CM | POA: Diagnosis present

## 2023-02-06 DIAGNOSIS — I130 Hypertensive heart and chronic kidney disease with heart failure and stage 1 through stage 4 chronic kidney disease, or unspecified chronic kidney disease: Secondary | ICD-10-CM | POA: Diagnosis present

## 2023-02-06 DIAGNOSIS — Z85038 Personal history of other malignant neoplasm of large intestine: Secondary | ICD-10-CM | POA: Diagnosis not present

## 2023-02-06 DIAGNOSIS — E1151 Type 2 diabetes mellitus with diabetic peripheral angiopathy without gangrene: Secondary | ICD-10-CM | POA: Diagnosis present

## 2023-02-06 DIAGNOSIS — E118 Type 2 diabetes mellitus with unspecified complications: Secondary | ICD-10-CM | POA: Diagnosis present

## 2023-02-06 DIAGNOSIS — N1831 Chronic kidney disease, stage 3a: Secondary | ICD-10-CM | POA: Diagnosis present

## 2023-02-06 DIAGNOSIS — Z6835 Body mass index (BMI) 35.0-35.9, adult: Secondary | ICD-10-CM | POA: Diagnosis not present

## 2023-02-06 DIAGNOSIS — K921 Melena: Secondary | ICD-10-CM

## 2023-02-06 DIAGNOSIS — Z9049 Acquired absence of other specified parts of digestive tract: Secondary | ICD-10-CM

## 2023-02-06 DIAGNOSIS — I5030 Unspecified diastolic (congestive) heart failure: Secondary | ICD-10-CM | POA: Diagnosis not present

## 2023-02-06 DIAGNOSIS — R3129 Other microscopic hematuria: Secondary | ICD-10-CM | POA: Diagnosis present

## 2023-02-06 DIAGNOSIS — E1122 Type 2 diabetes mellitus with diabetic chronic kidney disease: Secondary | ICD-10-CM | POA: Diagnosis present

## 2023-02-06 DIAGNOSIS — R1310 Dysphagia, unspecified: Secondary | ICD-10-CM | POA: Diagnosis present

## 2023-02-06 DIAGNOSIS — Z823 Family history of stroke: Secondary | ICD-10-CM | POA: Diagnosis not present

## 2023-02-06 LAB — URINALYSIS, ROUTINE W REFLEX MICROSCOPIC
Bilirubin Urine: NEGATIVE
Glucose, UA: NEGATIVE mg/dL
Ketones, ur: NEGATIVE mg/dL
Nitrite: NEGATIVE
Protein, ur: 30 mg/dL — AB
RBC / HPF: >50 RBC/hpf (ref 0–5)
Specific Gravity, Urine: 1.01 (ref 1.005–1.030)
Squamous Epithelial / HPF: 0 /[HPF] (ref 0–5)
pH: 6 (ref 5.0–8.0)

## 2023-02-06 LAB — CBG MONITORING, ED
Glucose-Capillary: 107 mg/dL — ABNORMAL HIGH (ref 70–99)
Glucose-Capillary: 95 mg/dL (ref 70–99)
Glucose-Capillary: 96 mg/dL (ref 70–99)
Glucose-Capillary: 98 mg/dL (ref 70–99)

## 2023-02-06 LAB — VITAMIN B12: Vitamin B-12: 791 pg/mL (ref 180–914)

## 2023-02-06 LAB — FERRITIN: Ferritin: 1351 ng/mL — ABNORMAL HIGH (ref 24–336)

## 2023-02-06 LAB — PREPARE RBC (CROSSMATCH)

## 2023-02-06 LAB — HEMOGLOBIN AND HEMATOCRIT, BLOOD
HCT: 24.5 % — ABNORMAL LOW (ref 39.0–52.0)
HCT: 26.6 % — ABNORMAL LOW (ref 39.0–52.0)
Hemoglobin: 7.4 g/dL — ABNORMAL LOW (ref 13.0–17.0)
Hemoglobin: 8.2 g/dL — ABNORMAL LOW (ref 13.0–17.0)

## 2023-02-06 LAB — RETICULOCYTES
Immature Retic Fract: 17.5 % — ABNORMAL HIGH (ref 2.3–15.9)
RBC.: 2.85 MIL/uL — ABNORMAL LOW (ref 4.22–5.81)
Retic Count, Absolute: 367.5 10*3/uL — ABNORMAL HIGH (ref 19.0–186.0)
Retic Ct Pct: 12.9 % — ABNORMAL HIGH (ref 0.4–3.1)

## 2023-02-06 LAB — IRON AND TIBC
Iron: 49 ug/dL (ref 45–182)
Saturation Ratios: 37 % (ref 17.9–39.5)
TIBC: 134 ug/dL — ABNORMAL LOW (ref 250–450)
UIBC: 85 ug/dL

## 2023-02-06 LAB — FOLATE: Folate: 6.8 ng/mL (ref >5.9)

## 2023-02-06 LAB — TROPONIN I (HIGH SENSITIVITY): Troponin I (High Sensitivity): 6 ng/L (ref <18)

## 2023-02-06 MED ORDER — FUROSEMIDE 10 MG/ML IJ SOLN
40.0000 mg | Freq: Once | INTRAMUSCULAR | Status: AC
  Administered 2023-02-06: 40 mg via INTRAVENOUS
  Filled 2023-02-06: qty 4

## 2023-02-06 MED ORDER — ONDANSETRON HCL 4 MG/2ML IJ SOLN: 4.0000 mg | Freq: Four times a day (QID) | INTRAMUSCULAR | Status: DC | PRN

## 2023-02-06 MED ORDER — SODIUM CHLORIDE 0.9% FLUSH
10.0000 mL | Freq: Two times a day (BID) | INTRAVENOUS | Status: DC
Start: 2023-02-06 — End: 2023-02-20
  Administered 2023-02-06 – 2023-02-20 (×29): 10 mL via INTRAVENOUS

## 2023-02-06 MED ORDER — ONDANSETRON HCL 4 MG PO TABS: 4.0000 mg | ORAL_TABLET | Freq: Four times a day (QID) | ORAL | Status: DC | PRN

## 2023-02-06 MED ORDER — INSULIN ASPART 100 UNIT/ML IJ SOLN
0.0000 [IU] | INTRAMUSCULAR | Status: DC
  Administered 2023-02-07: 2 [IU] via SUBCUTANEOUS
  Administered 2023-02-08: 1 [IU] via SUBCUTANEOUS
  Administered 2023-02-09: 3 [IU] via SUBCUTANEOUS
  Administered 2023-02-09 – 2023-02-12 (×8): 2 [IU] via SUBCUTANEOUS
  Administered 2023-02-12 – 2023-02-13 (×3): 1 [IU] via SUBCUTANEOUS
  Filled 2023-02-06 (×18): qty 1

## 2023-02-06 NOTE — Assessment & Plan Note (Signed)
Platelet count in 90s Near baseline Monitor
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
## 2023-02-06 NOTE — Assessment & Plan Note (Signed)
Creatinine 1.6 with GFR in the 40s Near baseline Monitor
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
## 2023-02-06 NOTE — Assessment & Plan Note (Signed)
Glucose in 150s SSI Monitor

## 2023-02-06 NOTE — ED Notes (Signed)
 CCMD notified of cardiac monitoring
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
## 2023-02-06 NOTE — Assessment & Plan Note (Signed)
Status post partial colectomy in the [facility] system November 2024

## 2023-02-06 NOTE — Assessment & Plan Note (Signed)
Noted occult GI bleeding in setting of history of prior history of GI bleeding June 2024 with associated tubular adenoma disease as well as sigmoid cancer status post partial colectomy November 2024 in the [facility] system. Hemoglobin 9.6-7.4 over the past 1 month Hemoccult positive per report Pending PRBC transfusion Will plan for formal GI evaluation Trend hemoglobin Transfuse for hemoglobin less than 7 Monitor

## 2023-02-06 NOTE — Assessment & Plan Note (Signed)
Hemoglobin 7.4 in setting of GI bleeding Pending PRBC transfusion Will monitor hemoglobin Transfuse for hemoglobin less than 7 Check anemia panel Monitor

## 2023-02-06 NOTE — Assessment & Plan Note (Signed)
2D echo July 2024 with EF of 55% Appears fairly euvolemic Noted large body habitus confounding issue to appropriate volume status Hold on IV fluids for now Diurese with PRBC transfusions Monitor volume status closely

## 2023-02-06 NOTE — ED Notes (Signed)
Pt resting comfortably at this time. Respirations even and unlabored. Denies complaints.

## 2023-02-07 ENCOUNTER — Encounter: Payer: Self-pay | Admitting: Family Medicine

## 2023-02-07 DIAGNOSIS — R195 Other fecal abnormalities: Secondary | ICD-10-CM

## 2023-02-07 LAB — COMPREHENSIVE METABOLIC PANEL
ALT: 11 U/L (ref 0–44)
AST: 20 U/L (ref 15–41)
Albumin: 2.1 g/dL — ABNORMAL LOW (ref 3.5–5.0)
Alkaline Phosphatase: 83 U/L (ref 38–126)
Anion gap: 12 (ref 5–15)
BUN: 21 mg/dL (ref 8–23)
CO2: 26 mmol/L (ref 22–32)
Calcium: 8.6 mg/dL — ABNORMAL LOW (ref 8.9–10.3)
Chloride: 101 mmol/L (ref 98–111)
Creatinine, Ser: 1.45 mg/dL — ABNORMAL HIGH (ref 0.61–1.24)
GFR, Estimated: 49 mL/min — ABNORMAL LOW (ref >60)
Glucose, Bld: 98 mg/dL (ref 70–99)
Potassium: 3.9 mmol/L (ref 3.5–5.1)
Sodium: 139 mmol/L (ref 135–145)
Total Bilirubin: 0.8 mg/dL (ref <1.2)
Total Protein: 8.1 g/dL (ref 6.5–8.1)

## 2023-02-07 LAB — GLUCOSE, CAPILLARY
Glucose-Capillary: 97 mg/dL (ref 70–99)
Glucose-Capillary: 90 mg/dL (ref 70–99)
Glucose-Capillary: 82 mg/dL (ref 70–99)
Glucose-Capillary: 91 mg/dL (ref 70–99)
Glucose-Capillary: 107 mg/dL — ABNORMAL HIGH (ref 70–99)
Glucose-Capillary: 165 mg/dL — ABNORMAL HIGH (ref 70–99)
Glucose-Capillary: 81 mg/dL (ref 70–99)

## 2023-02-07 LAB — HEMOGLOBIN AND HEMATOCRIT, BLOOD
HCT: 25.7 % — ABNORMAL LOW (ref 39.0–52.0)
HCT: 26.6 % — ABNORMAL LOW (ref 39.0–52.0)
Hemoglobin: 8 g/dL — ABNORMAL LOW (ref 13.0–17.0)
Hemoglobin: 8.1 g/dL — ABNORMAL LOW (ref 13.0–17.0)

## 2023-02-07 MED ORDER — DULOXETINE HCL 30 MG PO CPEP
60.0000 mg | ORAL_CAPSULE | Freq: Two times a day (BID) | ORAL | Status: DC
  Administered 2023-02-07 – 2023-02-20 (×27): 60 mg via ORAL
  Filled 2023-02-07 (×28): qty 2

## 2023-02-07 MED ORDER — LATANOPROST 0.005 % OP SOLN
1.0000 [drp] | Freq: Every day | OPHTHALMIC | Status: DC
Start: 2023-02-07 — End: 2023-02-20
  Administered 2023-02-07 – 2023-02-19 (×13): 1 [drp] via OPHTHALMIC
  Filled 2023-02-07: qty 2.5

## 2023-02-07 MED ORDER — FUROSEMIDE 40 MG PO TABS
40.0000 mg | ORAL_TABLET | Freq: Every day | ORAL | Status: DC
  Administered 2023-02-07 – 2023-02-20 (×14): 40 mg via ORAL
  Filled 2023-02-07 (×14): qty 1

## 2023-02-07 MED ORDER — MELATONIN 5 MG PO TABS
5.0000 mg | ORAL_TABLET | Freq: Every evening | ORAL | Status: DC | PRN
  Administered 2023-02-07 – 2023-02-08 (×2): 5 mg via ORAL
  Filled 2023-02-07 (×2): qty 1

## 2023-02-07 MED ORDER — PANTOPRAZOLE SODIUM 20 MG PO TBEC
20.0000 mg | DELAYED_RELEASE_TABLET | Freq: Every morning | ORAL | Status: DC
  Administered 2023-02-07 – 2023-02-20 (×14): 20 mg via ORAL
  Filled 2023-02-07 (×14): qty 1

## 2023-02-07 MED ORDER — METOPROLOL SUCCINATE ER 50 MG PO TB24
50.0000 mg | ORAL_TABLET | Freq: Every day | ORAL | Status: DC
  Administered 2023-02-07 – 2023-02-20 (×14): 50 mg via ORAL
  Filled 2023-02-07 (×14): qty 1

## 2023-02-07 MED ORDER — SIMVASTATIN 20 MG PO TABS
20.0000 mg | ORAL_TABLET | Freq: Every day | ORAL | Status: DC
Start: 2023-02-07 — End: 2023-02-20
  Administered 2023-02-07 – 2023-02-18 (×12): 20 mg via ORAL
  Filled 2023-02-07 (×12): qty 1

## 2023-02-07 MED ORDER — OXYBUTYNIN CHLORIDE ER 10 MG PO TB24
10.0000 mg | ORAL_TABLET | Freq: Every evening | ORAL | Status: DC
  Administered 2023-02-07 – 2023-02-19 (×13): 10 mg via ORAL
  Filled 2023-02-07 (×15): qty 1
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
## 2023-02-07 NOTE — Plan of Care (Signed)
Patient Alert and oriented X 4, Arrived early morning to unit, no c/o of pain or discomfort 0/10. Patient states he is unable to get up from bed and required max assist to turn side to side. Patient requires PT/OT to follow and has orders for scheduled q6 H&H. Chatted coverage Dr Geradine Girt, concerning drop in blood glucose scheduled q4 hours. Drr Geradine Girt stated through chat that patient is to utilize prn hypoglycemic protocol for drops in glucose. Last BS 90mg /dl. Problem: Education: Goal: Ability to describe self-care measures that may prevent or decrease complications (Diabetes Survival Skills Education) will improve Outcome: Progressing Goal: Individualized Educational Video(s) Outcome: Progressing   Problem: Coping: Goal: Ability to adjust to condition or change in health will improve Outcome: Progressing   Problem: Fluid Volume: Goal: Ability to maintain a balanced intake and output will improve Outcome: Progressing   Problem: Health Behavior/Discharge Planning: Goal: Ability to identify and utilize available resources and services will improve Outcome: Progressing Goal: Ability to manage health-related needs will improve Outcome: Progressing   Problem: Metabolic: Goal: Ability to maintain appropriate glucose levels will improve Outcome: Progressing   Problem: Nutritional: Goal: Maintenance of adequate nutrition will improve Outcome: Progressing Goal: Progress toward achieving an optimal weight will improve Outcome: Progressing   Problem: Skin Integrity: Goal: Risk for impaired skin integrity will decrease Outcome: Progressing   Problem: Tissue Perfusion: Goal: Adequacy of tissue perfusion will improve Outcome: Progressing   Problem: Education: Goal: Knowledge of General Education information will improve Description: Including pain rating scale, medication(s)/side effects and non-pharmacologic comfort measures Outcome: Progressing   Problem: Health Behavior/Discharge  Planning: Goal: Ability to manage health-related needs will improve Outcome: Progressing   Problem: Clinical Measurements: Goal: Ability to maintain clinical measurements within normal limits will improve Outcome: Progressing Goal: Will remain free from infection Outcome: Progressing Goal: Diagnostic test results will improve Outcome: Progressing Goal: Respiratory complications will improve Outcome: Progressing Goal: Cardiovascular complication will be avoided Outcome: Progressing   Problem: Activity: Goal: Risk for activity intolerance will decrease Outcome: Progressing   Problem: Nutrition: Goal: Adequate nutrition will be maintained Outcome: Progressing   Problem: Coping: Goal: Level of anxiety will decrease Outcome: Progressing   Problem: Elimination: Goal: Will not experience complications related to bowel motility Outcome: Progressing Goal: Will not experience complications related to urinary retention Outcome: Progressing   Problem: Pain Management: Goal: General experience of comfort will improve Outcome: Progressing   Problem: Safety: Goal: Ability to remain free from injury will improve Outcome: Progressing   Problem: Skin Integrity: Goal: Risk for impaired skin integrity will decrease Outcome: Progressing
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
## 2023-02-08 DIAGNOSIS — D649 Anemia, unspecified: Secondary | ICD-10-CM

## 2023-02-08 DIAGNOSIS — R195 Other fecal abnormalities: Secondary | ICD-10-CM | POA: Diagnosis not present

## 2023-02-08 LAB — GLUCOSE, CAPILLARY
Glucose-Capillary: 102 mg/dL — ABNORMAL HIGH (ref 70–99)
Glucose-Capillary: 106 mg/dL — ABNORMAL HIGH (ref 70–99)
Glucose-Capillary: 110 mg/dL — ABNORMAL HIGH (ref 70–99)
Glucose-Capillary: 115 mg/dL — ABNORMAL HIGH (ref 70–99)
Glucose-Capillary: 127 mg/dL — ABNORMAL HIGH (ref 70–99)
Glucose-Capillary: 91 mg/dL (ref 70–99)
Glucose-Capillary: 96 mg/dL (ref 70–99)

## 2023-02-08 LAB — PREPARE RBC (CROSSMATCH)

## 2023-02-08 LAB — HEMOGLOBIN AND HEMATOCRIT, BLOOD
HCT: 25.2 % — ABNORMAL LOW (ref 39.0–52.0)
HCT: 26.2 % — ABNORMAL LOW (ref 39.0–52.0)
Hemoglobin: 7.7 g/dL — ABNORMAL LOW (ref 13.0–17.0)
Hemoglobin: 8.3 g/dL — ABNORMAL LOW (ref 13.0–17.0)

## 2023-02-08 MED ORDER — SODIUM CHLORIDE 0.9% IV SOLUTION
Freq: Once | INTRAVENOUS | Status: AC
Start: 2023-02-08 — End: 2023-02-08

## 2023-02-08 NOTE — TOC Progression Note (Signed)
Transition of Care [facility]) - Progression Note    Patient Details  Name: Robert [name] MRN: [id number] Date of Birth: 11/21/1945  Transition of Care [facility]) CM/SW Contact  Margarito Liner, LCSW Phone Number: 02/08/2023, 4:11 PM  Clinical Narrative:   Wife wants to see if insurance will authorization rehab again. MD is aware and will consult PT and OT.  Expected Discharge Plan: Skilled Nursing Facility Barriers to Discharge: Continued Medical Work up  Expected Discharge Plan and Services     Post Acute Care Choice: Resumption of Svcs/PTA Provider Living arrangements for the past 2 months: Skilled Nursing Facility, Single Family Home Expected Discharge Date: 02/08/23                                     Social Determinants of Health (SDOH) Interventions SDOH Screenings   Food Insecurity: No Food Insecurity (02/07/2023)  Housing: Low Risk  (02/07/2023)  Transportation Needs: No Transportation Needs (02/07/2023)  Utilities: Not At Risk (02/07/2023)  Alcohol Screen: Low Risk  (10/29/2020)  Depression (PHQ2-9): Low Risk  (09/12/2021)  Financial Resource Strain: Low Risk  (12/15/2022)   Received from [facility]  Physical Activity: Inactive (10/29/2020)  Social Connections: Moderately Isolated (10/29/2020)  Stress: No Stress Concern Present (10/29/2020)  Tobacco Use: Low Risk  (02/07/2023)    Readmission Risk Interventions    02/07/2023   12:33 PM  Readmission Risk Prevention Plan  Transportation Screening Complete  Medication Review (RN Care Manager) Complete  PCP or Specialist appointment within 3-5 days of discharge Complete  SW Recovery Care/Counseling Consult Complete  Palliative Care Screening Not Applicable  Skilled Nursing Facility Complete

## 2023-02-08 NOTE — Progress Notes (Signed)
I was informed by Maralyn Sago, Child psychotherapist, that patient can't go to SNF today.  He will require insurance approval prior to going to SNF.  His wife wants him to go back to SNF.  He will need PT and OT evaluation.  Discharge has been canceled.
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
## 2023-02-08 NOTE — Plan of Care (Signed)
  Problem: Education: Goal: Individualized Educational Video(s) Outcome: Progressing   Problem: Coping: Goal: Ability to adjust to condition or change in health will improve Outcome: Progressing   Problem: Fluid Volume: Goal: Ability to maintain a balanced intake and output will improve Outcome: Progressing   Problem: Health Behavior/Discharge Planning: Goal: Ability to identify and utilize available resources and services will improve Outcome: Progressing   Problem: Metabolic: Goal: Ability to maintain appropriate glucose levels will improve Outcome: Progressing   Problem: Nutritional: Goal: Maintenance of adequate nutrition will improve Outcome: Progressing Goal: Progress toward achieving an optimal weight will improve Outcome: Progressing   Problem: Skin Integrity: Goal: Risk for impaired skin integrity will decrease Outcome: Progressing   Problem: Tissue Perfusion: Goal: Adequacy of tissue perfusion will improve Outcome: Progressing   Problem: Education: Goal: Knowledge of General Education information will improve Description: Including pain rating scale, medication(s)/side effects and non-pharmacologic comfort measures Outcome: Progressing   Problem: Health Behavior/Discharge Planning: Goal: Ability to manage health-related needs will improve Outcome: Progressing   Problem: Clinical Measurements: Goal: Ability to maintain clinical measurements within normal limits will improve Outcome: Progressing Goal: Will remain free from infection Outcome: Progressing Goal: Diagnostic test results will improve Outcome: Progressing Goal: Respiratory complications will improve Outcome: Progressing Goal: Cardiovascular complication will be avoided Outcome: Progressing   Problem: Activity: Goal: Risk for activity intolerance will decrease Outcome: Progressing   Problem: Nutrition: Goal: Adequate nutrition will be maintained Outcome: Progressing   Problem:  Coping: Goal: Level of anxiety will decrease Outcome: Progressing   Problem: Elimination: Goal: Will not experience complications related to bowel motility Outcome: Progressing Goal: Will not experience complications related to urinary retention Outcome: Progressing   Problem: Pain Management: Goal: General experience of comfort will improve Outcome: Progressing   Problem: Safety: Goal: Ability to remain free from injury will improve Outcome: Progressing   Problem: Skin Integrity: Goal: Risk for impaired skin integrity will decrease Outcome: Progressing

## 2023-02-08 NOTE — Plan of Care (Signed)
  Problem: Fluid Volume: Goal: Ability to maintain a balanced intake and output will improve Outcome: Progressing   Problem: Metabolic: Goal: Ability to maintain appropriate glucose levels will improve Outcome: Progressing   Problem: Nutritional: Goal: Maintenance of adequate nutrition will improve Outcome: Progressing   

## 2023-02-09 LAB — BPAM RBC
Blood Product Expiration Date: 202501052359
Blood Product Expiration Date: 202501062359
ISSUE DATE / TIME: 202412170516
ISSUE DATE / TIME: 202412191216
Unit Type and Rh: 600
Unit Type and Rh: 6200

## 2023-02-09 LAB — TYPE AND SCREEN
ABO/RH(D): AB POS
Antibody Screen: NEGATIVE
Unit division: 0
Unit division: 0

## 2023-02-09 LAB — GLUCOSE, CAPILLARY
Glucose-Capillary: 98 mg/dL (ref 70–99)
Glucose-Capillary: 115 mg/dL — ABNORMAL HIGH (ref 70–99)
Glucose-Capillary: 212 mg/dL — ABNORMAL HIGH (ref 70–99)
Glucose-Capillary: 105 mg/dL — ABNORMAL HIGH (ref 70–99)
Glucose-Capillary: 175 mg/dL — ABNORMAL HIGH (ref 70–99)
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
## 2023-02-09 NOTE — Plan of Care (Signed)
  Problem: Education: Goal: Ability to describe self-care measures that may prevent or decrease complications (Diabetes Survival Skills Education) will improve Outcome: Progressing Goal: Individualized Educational Video(s) Outcome: Progressing   Problem: Coping: Goal: Ability to adjust to condition or change in health will improve Outcome: Progressing   Problem: Fluid Volume: Goal: Ability to maintain a balanced intake and output will improve Outcome: Progressing   Problem: Health Behavior/Discharge Planning: Goal: Ability to identify and utilize available resources and services will improve Outcome: Progressing   Problem: Metabolic: Goal: Ability to maintain appropriate glucose levels will improve Outcome: Progressing   Problem: Nutritional: Goal: Maintenance of adequate nutrition will improve Outcome: Progressing Goal: Progress toward achieving an optimal weight will improve Outcome: Progressing   Problem: Skin Integrity: Goal: Risk for impaired skin integrity will decrease Outcome: Progressing   Problem: Tissue Perfusion: Goal: Adequacy of tissue perfusion will improve Outcome: Progressing   Problem: Education: Goal: Knowledge of General Education information will improve Description: Including pain rating scale, medication(s)/side effects and non-pharmacologic comfort measures Outcome: Progressing   Problem: Health Behavior/Discharge Planning: Goal: Ability to manage health-related needs will improve Outcome: Progressing   Problem: Clinical Measurements: Goal: Ability to maintain clinical measurements within normal limits will improve Outcome: Progressing Goal: Will remain free from infection Outcome: Progressing Goal: Diagnostic test results will improve Outcome: Progressing Goal: Respiratory complications will improve Outcome: Progressing Goal: Cardiovascular complication will be avoided Outcome: Progressing   Problem: Activity: Goal: Risk for activity  intolerance will decrease Outcome: Progressing   Problem: Nutrition: Goal: Adequate nutrition will be maintained Outcome: Progressing   Problem: Coping: Goal: Level of anxiety will decrease Outcome: Progressing   Problem: Elimination: Goal: Will not experience complications related to bowel motility Outcome: Progressing Goal: Will not experience complications related to urinary retention Outcome: Progressing   Problem: Pain Management: Goal: General experience of comfort will improve Outcome: Progressing   Problem: Safety: Goal: Ability to remain free from injury will improve Outcome: Progressing   Problem: Skin Integrity: Goal: Risk for impaired skin integrity will decrease Outcome: Progressing

## 2023-02-09 NOTE — TOC Progression Note (Addendum)
Transition of Care [facility]) - Progression Note    Patient Details  Name: Robert [name] MRN: [id number] Date of Birth: 1945/12/01  [facility]) CM/SW Contact  Margarito Liner, LCSW Phone Number: 02/09/2023, 10:12 AM  Clinical Narrative: CSW started SNF insurance authorization. Wife is aware. Left message for SNF admissions coordinator to notify.    11:12 am: CSW uploaded the OT evaluation into the [employer] portal for insurance authorization review.  4:11 pm: Auth still pending. SNF confirmed they cannot accept him over the weekend. Wife is aware.  Expected Discharge Plan: Skilled Nursing Facility Barriers to Discharge: Continued Medical Work up  Expected Discharge Plan and Services     Post Acute Care Choice: Resumption of Svcs/PTA Provider Living arrangements for the past 2 months: Skilled Nursing Facility, Single Family Home Expected Discharge Date: 02/08/23                                     Social Determinants of Health (SDOH) Interventions SDOH Screenings   Food Insecurity: No Food Insecurity (02/07/2023)  Housing: Low Risk  (02/07/2023)  Transportation Needs: No Transportation Needs (02/07/2023)  Utilities: Not At Risk (02/07/2023)  Alcohol Screen: Low Risk  (10/29/2020)  Depression (PHQ2-9): Low Risk  (09/12/2021)  Financial Resource Strain: Low Risk  (12/15/2022)   Received from [facility]  Physical Activity: Inactive (10/29/2020)  Social Connections: Moderately Isolated (10/29/2020)  Stress: No Stress Concern Present (10/29/2020)  Tobacco Use: Low Risk  (02/07/2023)    Readmission Risk Interventions    02/07/2023   12:33 PM  Readmission Risk Prevention Plan  Transportation Screening Complete  Medication Review (RN Care Manager) Complete  PCP or Specialist appointment within 3-5 days of discharge Complete  SW Recovery Care/Counseling Consult Complete  Palliative Care Screening Not Applicable  Skilled Nursing Facility  Complete
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
## 2023-02-09 NOTE — Care Management Important Message (Signed)
Important Message  Patient Details  Name: DUBOIS EICHMANN MRN: [id number] Date of Birth: March 19, 1945   Important Message Given:  Yes - Medicare IM  Left IM with Nurse to be given as pt was under contact precautions.   Bernadette Hoit 02/09/2023, 10:19 AM

## 2023-02-10 DIAGNOSIS — D61818 Other pancytopenia: Principal | ICD-10-CM

## 2023-02-10 LAB — BASIC METABOLIC PANEL
Anion gap: 8 (ref 5–15)
BUN: 18 mg/dL (ref 8–23)
CO2: 28 mmol/L (ref 22–32)
Calcium: 8.3 mg/dL — ABNORMAL LOW (ref 8.9–10.3)
Chloride: 100 mmol/L (ref 98–111)
Creatinine, Ser: 1.46 mg/dL — ABNORMAL HIGH (ref 0.61–1.24)
GFR, Estimated: 49 mL/min — ABNORMAL LOW (ref >60)
Glucose, Bld: 134 mg/dL — ABNORMAL HIGH (ref 70–99)
Potassium: 3.3 mmol/L — ABNORMAL LOW (ref 3.5–5.1)
Sodium: 136 mmol/L (ref 135–145)

## 2023-02-10 LAB — CBC
HCT: 27.4 % — ABNORMAL LOW (ref 39.0–52.0)
Hemoglobin: 8.5 g/dL — ABNORMAL LOW (ref 13.0–17.0)
MCH: 28.6 pg (ref 26.0–34.0)
MCHC: 31 g/dL (ref 30.0–36.0)
MCV: 92.3 fL (ref 80.0–100.0)
Platelets: 64 10*3/uL — ABNORMAL LOW (ref 150–400)
RBC: 2.97 MIL/uL — ABNORMAL LOW (ref 4.22–5.81)
RDW: 16.9 % — ABNORMAL HIGH (ref 11.5–15.5)
WBC: 3.9 10*3/uL — ABNORMAL LOW (ref 4.0–10.5)
nRBC: 0 % (ref 0.0–0.2)

## 2023-02-10 LAB — GLUCOSE, CAPILLARY
Glucose-Capillary: 78 mg/dL (ref 70–99)
Glucose-Capillary: 170 mg/dL — ABNORMAL HIGH (ref 70–99)
Glucose-Capillary: 103 mg/dL — ABNORMAL HIGH (ref 70–99)
Glucose-Capillary: 155 mg/dL — ABNORMAL HIGH (ref 70–99)
Glucose-Capillary: 153 mg/dL — ABNORMAL HIGH (ref 70–99)
Glucose-Capillary: 135 mg/dL — ABNORMAL HIGH (ref 70–99)
Glucose-Capillary: 164 mg/dL — ABNORMAL HIGH (ref 70–99)

## 2023-02-10 LAB — TECHNOLOGIST SMEAR REVIEW: Plt Morphology: NORMAL

## 2023-02-10 LAB — MAGNESIUM: Magnesium: 1.9 mg/dL (ref 1.7–2.4)

## 2023-02-10 MED ORDER — BISACODYL 10 MG RE SUPP: 10.0000 mg | Freq: Every day | RECTAL | Status: DC | PRN

## 2023-02-10 MED ORDER — POLYETHYLENE GLYCOL 3350 17 G PO PACK
17.0000 g | PACK | Freq: Every day | ORAL | Status: DC
  Administered 2023-02-10 – 2023-02-13 (×4): 17 g via ORAL
  Filled 2023-02-10 (×4): qty 1

## 2023-02-10 MED ORDER — FOLIC ACID 1 MG PO TABS
1.0000 mg | ORAL_TABLET | Freq: Every day | ORAL | Status: AC
  Administered 2023-02-10 – 2023-02-14 (×5): 1 mg via ORAL
  Filled 2023-02-10 (×5): qty 1

## 2023-02-10 MED ORDER — SENNA 8.6 MG PO TABS
1.0000 | ORAL_TABLET | Freq: Every day | ORAL | Status: DC
  Administered 2023-02-10 – 2023-02-20 (×10): 8.6 mg via ORAL
  Filled 2023-02-10 (×10): qty 1

## 2023-02-10 MED ORDER — POTASSIUM CHLORIDE 20 MEQ PO PACK
20.0000 meq | PACK | Freq: Once | ORAL | Status: AC
  Administered 2023-02-10: 20 meq via ORAL
  Filled 2023-02-10: qty 1

## 2023-02-10 NOTE — TOC Progression Note (Addendum)
Transition of Care [facility]) - Progression Note    Patient Details  Name: Robert [name] MRN: [id number] Date of Birth: [birth year]  Transition of Care [facility]) CM/SW Contact  Liliana Cline, LCSW Phone Number: 02/10/2023, 10:14 AM  Clinical Narrative:    Checked [employer] Portal, Berkley Harvey is pending.  4:11- Auth still pending.  Expected Discharge Plan: Skilled Nursing Facility Barriers to Discharge: Continued Medical Work up  Expected Discharge Plan and Services     Post Acute Care Choice: Resumption of Svcs/PTA Provider Living arrangements for the past 2 months: Skilled Nursing Facility, Single Family Home Expected Discharge Date: 02/08/23                                     Social Determinants of Health (SDOH) Interventions SDOH Screenings   Food Insecurity: No Food Insecurity (02/07/2023)  Housing: Low Risk  (02/07/2023)  Transportation Needs: No Transportation Needs (02/07/2023)  Utilities: Not At Risk (02/07/2023)  Alcohol Screen: Low Risk  (10/29/2020)  Depression (PHQ2-9): Low Risk  (09/12/2021)  Financial Resource Strain: Low Risk  (12/15/2022)   Received from [facility]  Physical Activity: Inactive (10/29/2020)  Social Connections: Moderately Isolated (10/29/2020)  Stress: No Stress Concern Present (10/29/2020)  Tobacco Use: Low Risk  (02/07/2023)    Readmission Risk Interventions    02/07/2023   12:33 PM  Readmission Risk Prevention Plan  Transportation Screening Complete  Medication Review (RN Care Manager) Complete  PCP or Specialist appointment within 3-5 days of discharge Complete  SW Recovery Care/Counseling Consult Complete  Palliative Care Screening Not Applicable  Skilled Nursing Facility Complete
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
## 2023-02-10 NOTE — Progress Notes (Signed)
   02/10/23 2031  Assess: MEWS Score  Temp 98 F (36.7 C)  BP (!) 139/92  MAP (mmHg) 105  Pulse Rate (!) 120  Resp 17  SpO2 98 %  O2 Device Room Air  Assess: MEWS Score  MEWS Temp 0  MEWS Systolic 0  MEWS Pulse 2  MEWS RR 0  MEWS LOC 0  MEWS Score 2  MEWS Score Color Yellow  Assess: if the MEWS score is Yellow or Red  Were vital signs accurate and taken at a resting state? Yes  Does the patient meet 2 or more of the SIRS criteria? No  MEWS guidelines implemented  Yes, yellow  Treat  MEWS Interventions Considered administering scheduled or prn medications/treatments as ordered  Take Vital Signs  Increase Vital Sign Frequency  Yellow: Q2hr x1, continue Q4hrs until patient remains green for 12hrs  Escalate  MEWS: Escalate Yellow: Discuss with charge nurse and consider notifying provider and/or RRT  Notify: Charge Nurse/RN  Name of Charge Nurse/RN Notified Raynelle Fanning  Assess: SIRS CRITERIA  SIRS Temperature  0  SIRS Respirations  0  SIRS Pulse 1  SIRS WBC 0  SIRS Score Sum  1   Heart rate was elevated

## 2023-02-11 LAB — IMMATURE PLATELET FRACTION: Immature Platelet Fraction: 10.7 % — ABNORMAL HIGH (ref 1.2–8.6)

## 2023-02-11 LAB — BASIC METABOLIC PANEL
Anion gap: 9 (ref 5–15)
BUN: 17 mg/dL (ref 8–23)
CO2: 27 mmol/L (ref 22–32)
Calcium: 8.3 mg/dL — ABNORMAL LOW (ref 8.9–10.3)
Chloride: 99 mmol/L (ref 98–111)
Creatinine, Ser: 1.27 mg/dL — ABNORMAL HIGH (ref 0.61–1.24)
GFR, Estimated: 58 mL/min — ABNORMAL LOW (ref >60)
Glucose, Bld: 100 mg/dL — ABNORMAL HIGH (ref 70–99)
Potassium: 3.7 mmol/L (ref 3.5–5.1)
Sodium: 135 mmol/L (ref 135–145)

## 2023-02-11 LAB — CBC WITH DIFFERENTIAL/PLATELET
Abs Immature Granulocytes: 0.01 10*3/uL (ref 0.00–0.07)
Basophils Absolute: 0 10*3/uL (ref 0.0–0.1)
Basophils Relative: 0 %
Eosinophils Absolute: 0 10*3/uL (ref 0.0–0.5)
Eosinophils Relative: 0 %
HCT: 29.3 % — ABNORMAL LOW (ref 39.0–52.0)
Hemoglobin: 8.9 g/dL — ABNORMAL LOW (ref 13.0–17.0)
Immature Granulocytes: 0 %
Lymphocytes Relative: 74 %
Lymphs Abs: 3 10*3/uL (ref 0.7–4.0)
MCH: 28.3 pg (ref 26.0–34.0)
MCHC: 30.4 g/dL (ref 30.0–36.0)
MCV: 93 fL (ref 80.0–100.0)
Monocytes Absolute: 0.4 10*3/uL (ref 0.1–1.0)
Monocytes Relative: 9 %
Neutro Abs: 0.7 10*3/uL — ABNORMAL LOW (ref 1.7–7.7)
Neutrophils Relative %: 17 %
Platelets: 62 10*3/uL — ABNORMAL LOW (ref 150–400)
RBC: 3.15 MIL/uL — ABNORMAL LOW (ref 4.22–5.81)
RDW: 16.9 % — ABNORMAL HIGH (ref 11.5–15.5)
WBC: 4 10*3/uL (ref 4.0–10.5)
nRBC: 0 % (ref 0.0–0.2)

## 2023-02-11 LAB — GLUCOSE, CAPILLARY
Glucose-Capillary: 101 mg/dL — ABNORMAL HIGH (ref 70–99)
Glucose-Capillary: 102 mg/dL — ABNORMAL HIGH (ref 70–99)
Glucose-Capillary: 112 mg/dL — ABNORMAL HIGH (ref 70–99)
Glucose-Capillary: 113 mg/dL — ABNORMAL HIGH (ref 70–99)
Glucose-Capillary: 167 mg/dL — ABNORMAL HIGH (ref 70–99)
Glucose-Capillary: 182 mg/dL — ABNORMAL HIGH (ref 70–99)
Glucose-Capillary: 90 mg/dL (ref 70–99)

## 2023-02-11 LAB — MAGNESIUM: Magnesium: 1.8 mg/dL (ref 1.7–2.4)

## 2023-02-11 NOTE — Plan of Care (Signed)
  Problem: Coping: Goal: Ability to adjust to condition or change in health will improve Outcome: Progressing   Problem: Fluid Volume: Goal: Ability to maintain a balanced intake and output will improve Outcome: Progressing

## 2023-02-11 NOTE — TOC Progression Note (Addendum)
Transition of Care [facility]) - Progression Note    Patient Details  Name: Robert [name] MRN: [id number] Date of Birth: [birth year]  Transition of Care [facility]) CM/SW Contact  Liliana Cline, LCSW Phone Number: 02/11/2023, 8:59 AM  Clinical Narrative:    [facility] Harvey still pending.  2:47- Auth still pending.  Expected Discharge Plan: Skilled Nursing Facility Barriers to Discharge: Continued Medical Work up  Expected Discharge Plan and Services     Post Acute Care Choice: Resumption of Svcs/PTA Provider Living arrangements for the past 2 months: Skilled Nursing Facility, Single Family Home Expected Discharge Date: 02/08/23                                     Social Determinants of Health (SDOH) Interventions SDOH Screenings   Food Insecurity: No Food Insecurity (02/07/2023)  Housing: Low Risk  (02/07/2023)  Transportation Needs: No Transportation Needs (02/07/2023)  Utilities: Not At Risk (02/07/2023)  Alcohol Screen: Low Risk  (10/29/2020)  Depression (PHQ2-9): Low Risk  (09/12/2021)  Financial Resource Strain: Low Risk  (12/15/2022)   Received from [facility]  Physical Activity: Inactive (10/29/2020)  Social Connections: Moderately Isolated (10/29/2020)  Stress: No Stress Concern Present (10/29/2020)  Tobacco Use: Low Risk  (02/07/2023)    Readmission Risk Interventions    02/07/2023   12:33 PM  Readmission Risk Prevention Plan  Transportation Screening Complete  Medication Review (RN Care Manager) Complete  PCP or Specialist appointment within 3-5 days of discharge Complete  SW Recovery Care/Counseling Consult Complete  Palliative Care Screening Not Applicable  Skilled Nursing Facility Complete
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
## 2023-02-11 NOTE — Plan of Care (Signed)

## 2023-02-12 LAB — GLUCOSE, CAPILLARY
Glucose-Capillary: 97 mg/dL (ref 70–99)
Glucose-Capillary: 120 mg/dL — ABNORMAL HIGH (ref 70–99)
Glucose-Capillary: 144 mg/dL — ABNORMAL HIGH (ref 70–99)
Glucose-Capillary: 105 mg/dL — ABNORMAL HIGH (ref 70–99)
Glucose-Capillary: 168 mg/dL — ABNORMAL HIGH (ref 70–99)
Glucose-Capillary: 116 mg/dL — ABNORMAL HIGH (ref 70–99)

## 2023-02-12 MED ORDER — LACTULOSE ENEMA
300.0000 mL | Freq: Once | ORAL | Status: AC
  Administered 2023-02-12: 300 mL via RECTAL
  Filled 2023-02-12: qty 300

## 2023-02-12 MED ORDER — MAGNESIUM HYDROXIDE 400 MG/5ML PO SUSP
15.0000 mL | Freq: Every day | ORAL | Status: DC | PRN
  Administered 2023-02-12: 15 mL via ORAL
  Filled 2023-02-12: qty 30
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
## 2023-02-12 NOTE — Plan of Care (Signed)
  Problem: Nutritional: Goal: Maintenance of adequate nutrition will improve Outcome: Progressing   Problem: Activity: Goal: Risk for activity intolerance will decrease Outcome: Progressing   Problem: Coping: Goal: Level of anxiety will decrease Outcome: Progressing

## 2023-02-12 NOTE — TOC Progression Note (Signed)
Transition of Care [facility]) - Progression Note    Patient Details  Name: OBRA BURCKHARD MRN: [id number] Date of Birth: 1945/03/22  [facility]) CM/SW Contact  Chapman Fitch, RN Phone Number: 02/12/2023, 1:52 PM  Clinical Narrative:     Updated note submitted in navi portal  Expected Discharge Plan: Skilled Nursing Facility Barriers to Discharge: Continued Medical Work up  Expected Discharge Plan and Services     Post Acute Care Choice: Resumption of Svcs/PTA Provider Living arrangements for the past 2 months: Skilled Nursing Facility, Single Family Home Expected Discharge Date: 02/08/23                                     Social Determinants of Health (SDOH) Interventions SDOH Screenings   Food Insecurity: No Food Insecurity (02/07/2023)  Housing: Low Risk  (02/07/2023)  Transportation Needs: No Transportation Needs (02/07/2023)  Utilities: Not At Risk (02/07/2023)  Alcohol Screen: Low Risk  (10/29/2020)  Depression (PHQ2-9): Low Risk  (09/12/2021)  Financial Resource Strain: Low Risk  (12/15/2022)   Received from [facility]  Physical Activity: Inactive (10/29/2020)  Social Connections: Moderately Isolated (10/29/2020)  Stress: No Stress Concern Present (10/29/2020)  Tobacco Use: Low Risk  (02/07/2023)    Readmission Risk Interventions    02/07/2023   12:33 PM  Readmission Risk Prevention Plan  Transportation Screening Complete  Medication Review (RN Care Manager) Complete  PCP or Specialist appointment within 3-5 days of discharge Complete  SW Recovery Care/Counseling Consult Complete  Palliative Care Screening Not Applicable  Skilled Nursing Facility Complete

## 2023-02-12 NOTE — TOC Progression Note (Signed)
Transition of Care [facility]) - Progression Note    Patient Details  Name: Robert [name] MRN: [id number] Date of Birth: 07/20/1945  [facility]) CM/SW Contact  Chapman Fitch, RN Phone Number: 02/12/2023, 11:59 AM  Clinical Narrative:     Insurance auth pending for SNF   Expected Discharge Plan: Skilled Nursing Facility Barriers to Discharge: Continued Medical Work up  Expected Discharge Plan and Services     Post Acute Care Choice: Resumption of Svcs/PTA Provider Living arrangements for the past 2 months: Skilled Nursing Facility, Single Family Home Expected Discharge Date: 02/08/23                                     Social Determinants of Health (SDOH) Interventions SDOH Screenings   Food Insecurity: No Food Insecurity (02/07/2023)  Housing: Low Risk  (02/07/2023)  Transportation Needs: No Transportation Needs (02/07/2023)  Utilities: Not At Risk (02/07/2023)  Alcohol Screen: Low Risk  (10/29/2020)  Depression (PHQ2-9): Low Risk  (09/12/2021)  Financial Resource Strain: Low Risk  (12/15/2022)   Received from [facility]  Physical Activity: Inactive (10/29/2020)  Social Connections: Moderately Isolated (10/29/2020)  Stress: No Stress Concern Present (10/29/2020)  Tobacco Use: Low Risk  (02/07/2023)    Readmission Risk Interventions    02/07/2023   12:33 PM  Readmission Risk Prevention Plan  Transportation Screening Complete  Medication Review (RN Care Manager) Complete  PCP or Specialist appointment within 3-5 days of discharge Complete  SW Recovery Care/Counseling Consult Complete  Palliative Care Screening Not Applicable  Skilled Nursing Facility Complete

## 2023-02-12 NOTE — Care Management Important Message (Addendum)
Important Message  Patient Details  Name: Robert [name] MRN: [id number] Date of Birth: [birth year]   Important Message Given:  Yes - Medicare IM Left IM with PTs Nurse as Pt was on Contact Precautions.    Bernadette Hoit 02/12/2023, 11:51 AM

## 2023-02-13 DIAGNOSIS — R195 Other fecal abnormalities: Secondary | ICD-10-CM | POA: Diagnosis not present

## 2023-02-13 LAB — GLUCOSE, CAPILLARY
Glucose-Capillary: 102 mg/dL — ABNORMAL HIGH (ref 70–99)
Glucose-Capillary: 103 mg/dL — ABNORMAL HIGH (ref 70–99)
Glucose-Capillary: 105 mg/dL — ABNORMAL HIGH (ref 70–99)
Glucose-Capillary: 108 mg/dL — ABNORMAL HIGH (ref 70–99)
Glucose-Capillary: 145 mg/dL — ABNORMAL HIGH (ref 70–99)
Glucose-Capillary: 146 mg/dL — ABNORMAL HIGH (ref 70–99)

## 2023-02-13 LAB — URINE CULTURE: Culture: 90000 — AB

## 2023-02-13 MED ORDER — POLYETHYLENE GLYCOL 3350 17 G PO PACK
34.0000 g | PACK | ORAL | Status: AC
  Administered 2023-02-13 – 2023-02-14 (×5): 34 g via ORAL
  Filled 2023-02-13 (×2): qty 2

## 2023-02-13 NOTE — TOC Progression Note (Signed)
Transition of Care [facility]) - Progression Note    Patient Details  Name: Robert [name] MRN: [id number] Date of Birth: May 03, 1945  Transition of Care [facility]) CM/SW Contact  Chapman Fitch, RN Phone Number: 02/13/2023, 11:08 AM  Clinical Narrative:     Insurance auth pending  Expected Discharge Plan: Skilled Nursing Facility Barriers to Discharge: Continued Medical Work up  Expected Discharge Plan and Services     Post Acute Care Choice: Resumption of Svcs/PTA Provider Living arrangements for the past 2 months: Skilled Nursing Facility, Single Family Home Expected Discharge Date: 02/08/23                                     Social Determinants of Health (SDOH) Interventions SDOH Screenings   Food Insecurity: No Food Insecurity (02/07/2023)  Housing: Low Risk  (02/07/2023)  Transportation Needs: No Transportation Needs (02/07/2023)  Utilities: Not At Risk (02/07/2023)  Alcohol Screen: Low Risk  (10/29/2020)  Depression (PHQ2-9): Low Risk  (09/12/2021)  Financial Resource Strain: Low Risk  (12/15/2022)   Received from [facility]  Physical Activity: Inactive (10/29/2020)  Social Connections: Moderately Isolated (10/29/2020)  Stress: No Stress Concern Present (10/29/2020)  Tobacco Use: Low Risk  (02/07/2023)    Readmission Risk Interventions    02/07/2023   12:33 PM  Readmission Risk Prevention Plan  Transportation Screening Complete  Medication Review (RN Care Manager) Complete  PCP or Specialist appointment within 3-5 days of discharge Complete  SW Recovery Care/Counseling Consult Complete  Palliative Care Screening Not Applicable  Skilled Nursing Facility Complete
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
## 2023-02-13 NOTE — Plan of Care (Signed)

## 2023-02-13 NOTE — TOC Progression Note (Signed)
Transition of Care [facility]) - Progression Note    Patient Details  Name: Robert [name] MRN: [id number] Date of Birth: [birth year]  Transition of Care [facility]) CM/SW Contact  Chapman Fitch, RN Phone Number: 02/13/2023, 3:28 PM  Clinical Narrative:     [facility] still pending   Expected Discharge Plan: Skilled Nursing Facility Barriers to Discharge: Continued Medical Work up  Expected Discharge Plan and Services     Post Acute Care Choice: Resumption of Svcs/PTA Provider Living arrangements for the past 2 months: Skilled Nursing Facility, Single Family Home Expected Discharge Date: 02/08/23                                     Social Determinants of Health (SDOH) Interventions SDOH Screenings   Food Insecurity: No Food Insecurity (02/07/2023)  Housing: Low Risk  (02/07/2023)  Transportation Needs: No Transportation Needs (02/07/2023)  Utilities: Not At Risk (02/07/2023)  Alcohol Screen: Low Risk  (10/29/2020)  Depression (PHQ2-9): Low Risk  (09/12/2021)  Financial Resource Strain: Low Risk  (12/15/2022)   Received from [facility]  Physical Activity: Inactive (10/29/2020)  Social Connections: Moderately Isolated (10/29/2020)  Stress: No Stress Concern Present (10/29/2020)  Tobacco Use: Low Risk  (02/07/2023)    Readmission Risk Interventions    02/07/2023   12:33 PM  Readmission Risk Prevention Plan  Transportation Screening Complete  Medication Review (RN Care Manager) Complete  PCP or Specialist appointment within 3-5 days of discharge Complete  SW Recovery Care/Counseling Consult Complete  Palliative Care Screening Not Applicable  Skilled Nursing Facility Complete

## 2023-02-14 DIAGNOSIS — R195 Other fecal abnormalities: Secondary | ICD-10-CM | POA: Diagnosis not present

## 2023-02-14 LAB — GLUCOSE, CAPILLARY
Glucose-Capillary: 113 mg/dL — ABNORMAL HIGH (ref 70–99)
Glucose-Capillary: 130 mg/dL — ABNORMAL HIGH (ref 70–99)

## 2023-02-14 NOTE — Plan of Care (Signed)
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
## 2023-02-15 DIAGNOSIS — R195 Other fecal abnormalities: Secondary | ICD-10-CM | POA: Diagnosis not present
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
## 2023-02-15 NOTE — TOC Progression Note (Signed)
Transition of Care [facility]) - Progression Note    Patient Details  Name: Robert [name] MRN: [id number] Date of Birth: 03-20-1945  Transition of Care [facility]) CM/SW Contact  Margarito Liner, LCSW Phone Number: 02/15/2023, 9:18 AM  Clinical Narrative:  MD completed peer-to-peer review. CSW uploaded today's PT note and this CSW's note showing that patient is not long-term at SNF.  Expected Discharge Plan: Skilled Nursing Facility Barriers to Discharge: Continued Medical Work up  Expected Discharge Plan and Services     Post Acute Care Choice: Resumption of Svcs/PTA Provider Living arrangements for the past 2 months: Skilled Nursing Facility, Single Family Home Expected Discharge Date: 02/08/23                                     Social Determinants of Health (SDOH) Interventions SDOH Screenings   Food Insecurity: No Food Insecurity (02/07/2023)  Housing: Low Risk  (02/07/2023)  Transportation Needs: No Transportation Needs (02/07/2023)  Utilities: Not At Risk (02/07/2023)  Alcohol Screen: Low Risk  (10/29/2020)  Depression (PHQ2-9): Low Risk  (09/12/2021)  Financial Resource Strain: Low Risk  (12/15/2022)   Received from [facility]  Physical Activity: Inactive (10/29/2020)  Social Connections: Moderately Isolated (10/29/2020)  Stress: No Stress Concern Present (10/29/2020)  Tobacco Use: Low Risk  (02/07/2023)    Readmission Risk Interventions    02/07/2023   12:33 PM  Readmission Risk Prevention Plan  Transportation Screening Complete  Medication Review (RN Care Manager) Complete  PCP or Specialist appointment within 3-5 days of discharge Complete  SW Recovery Care/Counseling Consult Complete  Palliative Care Screening Not Applicable  Skilled Nursing Facility Complete

## 2023-02-15 NOTE — Plan of Care (Signed)

## 2023-02-15 NOTE — TOC Progression Note (Signed)
Transition of Care [facility]) - Progression Note    Patient Details  Name: Robert [name] MRN: [id number] Date of Birth: 07-10-1945  Transition of Care [facility]) CM/SW Contact  Garret Reddish, RN Phone Number: 02/15/2023, 3:58 PM  Clinical Narrative:    Received message from Charlynn Court, Social Worker.  Maralyn Sago informs me that SNF authorization has been denied.  I have informed [name] of the SNF denial.  I have given [name] the [employer] number to submit a family appeal.  Family appeal number is [id number].  [name] will appeal the decision.    TOC will continue to follow for discharge planning.     Expected Discharge Plan: Skilled Nursing Facility Barriers to Discharge: Continued Medical Work up  Expected Discharge Plan and Services     Post Acute Care Choice: Resumption of Svcs/PTA Provider Living arrangements for the past 2 months: Skilled Nursing Facility, Single Family Home Expected Discharge Date: 02/08/23                                     Social Determinants of Health (SDOH) Interventions SDOH Screenings   Food Insecurity: No Food Insecurity (02/07/2023)  Housing: Low Risk  (02/07/2023)  Transportation Needs: No Transportation Needs (02/07/2023)  Utilities: Not At Risk (02/07/2023)  Alcohol Screen: Low Risk  (10/29/2020)  Depression (PHQ2-9): Low Risk  (09/12/2021)  Financial Resource Strain: Low Risk  (12/15/2022)   [facility]  Physical Activity: Inactive (10/29/2020)  Social Connections: Moderately Isolated (10/29/2020)  Stress: No Stress Concern Present (10/29/2020)  Tobacco Use: Low Risk  (02/07/2023)    Readmission Risk Interventions    02/07/2023   12:33 PM  Readmission Risk Prevention Plan  Transportation Screening Complete  Medication Review (RN Care Manager) Complete  PCP or Specialist appointment within 3-5 days of discharge Complete  SW Recovery Care/Counseling Consult Complete   Palliative Care Screening Not Applicable  Skilled Nursing Facility Complete

## 2023-02-15 NOTE — Plan of Care (Signed)
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
## 2023-02-16 DIAGNOSIS — R195 Other fecal abnormalities: Secondary | ICD-10-CM | POA: Diagnosis not present

## 2023-02-16 NOTE — Plan of Care (Signed)
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
## 2023-02-16 NOTE — TOC Progression Note (Signed)
Transition of Care [facility]) - Progression Note    Patient Details  Name: Robert [name] MRN: [id number] Date of Birth: Apr 23, 1945  Transition of Care [facility]) CM/SW Contact  Margarito Liner, LCSW Phone Number: 02/16/2023, 4:54 PM  Clinical Narrative:  [name] would not let wife start appeal since she is not patient's HCPOA. CSW faxed clinicals to fax number provided to start expedited appeal.   Expected Discharge Plan: Skilled Nursing Facility Barriers to Discharge: Continued Medical Work up  Expected Discharge Plan and Services     Post Acute Care Choice: Resumption of Svcs/PTA Provider Living arrangements for the past 2 months: Skilled Nursing Facility, Single Family Home Expected Discharge Date: 02/08/23                                     Social Determinants of Health (SDOH) Interventions SDOH Screenings   Food Insecurity: No Food Insecurity (02/07/2023)  Housing: Low Risk  (02/07/2023)  Transportation Needs: No Transportation Needs (02/07/2023)  Utilities: Not At Risk (02/07/2023)  Alcohol Screen: Low Risk  (10/29/2020)  Depression (PHQ2-9): Low Risk  (09/12/2021)  Financial Resource Strain: Low Risk  (12/15/2022)   Received from [facility]  Physical Activity: Inactive (10/29/2020)  Social Connections: Moderately Isolated (10/29/2020)  Stress: No Stress Concern Present (10/29/2020)  Tobacco Use: Low Risk  (02/07/2023)    Readmission Risk Interventions    02/07/2023   12:33 PM  Readmission Risk Prevention Plan  Transportation Screening Complete  Medication Review (RN Care Manager) Complete  PCP or Specialist appointment within 3-5 days of discharge Complete  SW Recovery Care/Counseling Consult Complete  Palliative Care Screening Not Applicable  Skilled Nursing Facility Complete

## 2023-02-17 DIAGNOSIS — R195 Other fecal abnormalities: Secondary | ICD-10-CM | POA: Diagnosis not present

## 2023-02-17 NOTE — TOC Progression Note (Signed)
Transition of Care [facility]) - Progression Note    Patient Details  Name: ARTAVIS GUZZARDO MRN: [id number] Date of Birth: 1945-06-23  Transition of Care [facility]) CM/SW Contact  Liliana Cline, LCSW Phone Number: 02/17/2023, 12:44 PM  Clinical Narrative:    CSW called [employer] at [phone], spoke to [employer] to inquire status of appeal. [employer] states CSW must call the provider line at [phone]. CSW called the provider line, pressed options 1 and 3. Left VM on [employer] line requesting a return call.   Expected Discharge Plan: Skilled Nursing Facility Barriers to Discharge: Continued Medical Work up  Expected Discharge Plan and Services     Post Acute Care Choice: Resumption of Svcs/PTA Provider Living arrangements for the past 2 months: Skilled Nursing Facility, Single Family Home Expected Discharge Date: 02/08/23                                     Social Determinants of Health (SDOH) Interventions SDOH Screenings   Food Insecurity: No Food Insecurity (02/07/2023)  Housing: Low Risk  (02/07/2023)  Transportation Needs: No Transportation Needs (02/07/2023)  Utilities: Not At Risk (02/07/2023)  Alcohol Screen: Low Risk  (10/29/2020)  Depression (PHQ2-9): Low Risk  (09/12/2021)  Financial Resource Strain: Low Risk  (12/15/2022)   Received from [facility]  Physical Activity: Inactive (10/29/2020)  Social Connections: Moderately Isolated (10/29/2020)  Stress: No Stress Concern Present (10/29/2020)  Tobacco Use: Low Risk  (02/07/2023)    Readmission Risk Interventions    02/07/2023   12:33 PM  Readmission Risk Prevention Plan  Transportation Screening Complete  Medication Review (RN Care Manager) Complete  PCP or Specialist appointment within 3-5 days of discharge Complete  SW Recovery Care/Counseling Consult Complete  Palliative Care Screening Not Applicable  Skilled Nursing Facility Complete

## 2023-02-17 NOTE — Plan of Care (Signed)
  Problem: Education: Goal: Ability to describe self-care measures that may prevent or decrease complications (Diabetes Survival Skills Education) will improve Outcome: Progressing Goal: Individualized Educational Video(s) Outcome: Progressing   
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
## 2023-02-18 DIAGNOSIS — R195 Other fecal abnormalities: Secondary | ICD-10-CM | POA: Diagnosis not present

## 2023-02-18 MED ORDER — DIPHENHYDRAMINE HCL 25 MG PO CAPS
25.0000 mg | ORAL_CAPSULE | Freq: Three times a day (TID) | ORAL | Status: DC | PRN
  Administered 2023-02-18: 25 mg via ORAL
  Filled 2023-02-18: qty 1

## 2023-02-18 MED ORDER — ACETAMINOPHEN 500 MG PO TABS
1000.0000 mg | ORAL_TABLET | Freq: Three times a day (TID) | ORAL | Status: DC | PRN
  Administered 2023-02-18 – 2023-02-20 (×3): 1000 mg via ORAL
  Filled 2023-02-18 (×3): qty 2

## 2023-02-18 MED ORDER — HYDROCORTISONE 1 % EX CREA
TOPICAL_CREAM | Freq: Two times a day (BID) | CUTANEOUS | Status: DC
  Filled 2023-02-18: qty 28
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
## 2023-02-19 DIAGNOSIS — R195 Other fecal abnormalities: Secondary | ICD-10-CM | POA: Diagnosis not present

## 2023-02-19 LAB — BASIC METABOLIC PANEL
Anion gap: 10 (ref 5–15)
BUN: 17 mg/dL (ref 8–23)
CO2: 25 mmol/L (ref 22–32)
Calcium: 8.6 mg/dL — ABNORMAL LOW (ref 8.9–10.3)
Chloride: 96 mmol/L — ABNORMAL LOW (ref 98–111)
Creatinine, Ser: 1.39 mg/dL — ABNORMAL HIGH (ref 0.61–1.24)
GFR, Estimated: 52 mL/min — ABNORMAL LOW (ref >60)
Glucose, Bld: 180 mg/dL — ABNORMAL HIGH (ref 70–99)
Potassium: 3.9 mmol/L (ref 3.5–5.1)
Sodium: 131 mmol/L — ABNORMAL LOW (ref 135–145)

## 2023-02-19 LAB — CBC
HCT: 32.6 % — ABNORMAL LOW (ref 39.0–52.0)
Hemoglobin: 9.8 g/dL — ABNORMAL LOW (ref 13.0–17.0)
MCH: 28.1 pg (ref 26.0–34.0)
MCHC: 30.1 g/dL (ref 30.0–36.0)
MCV: 93.4 fL (ref 80.0–100.0)
Platelets: 103 10*3/uL — ABNORMAL LOW (ref 150–400)
RBC: 3.49 MIL/uL — ABNORMAL LOW (ref 4.22–5.81)
RDW: 15.5 % (ref 11.5–15.5)
WBC: 4 10*3/uL (ref 4.0–10.5)
nRBC: 0.5 % — ABNORMAL HIGH (ref 0.0–0.2)
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
## 2023-02-19 NOTE — TOC Progression Note (Addendum)
Transition of Care [facility]) - Progression Note    Patient Details  Name: Robert [name] MRN: [id number] Date of Birth: 1945/07/26  [facility] of [facility]) CM/SW Contact  Margarito Liner, LCSW Phone Number: 02/19/2023, 10:46 AM  Clinical Narrative:  CSW called [employer] who stated that the appeal was successful and the auth denial was overturned. This took place today so they said to give it a few hours to obtain the authorization details. Left message for [facility] admissions coordinator to notify.  11:08 am: CSW called and updated wife. She will update patient.  2:08 pm: Authorization details are still pending.  4:47 pm: CSW received auth approval. Reference #  [id number]. Valid 12/30-1/12. Left message for [facility] admissions coordinator to notify.  Expected Discharge Plan: Skilled Nursing Facility Barriers to Discharge: Continued Medical Work up  Expected Discharge Plan and Services     Post Acute Care Choice: Resumption of Svcs/PTA Provider Living arrangements for the past 2 months: Skilled Nursing Facility, Single Family Home Expected Discharge Date: 02/08/23                                     Social Determinants of Health (SDOH) Interventions SDOH Screenings   Food Insecurity: No Food Insecurity (02/07/2023)  Housing: Low Risk  (02/07/2023)  Transportation Needs: No Transportation Needs (02/07/2023)  Utilities: Not At Risk (02/07/2023)  Alcohol Screen: Low Risk  (10/29/2020)  Depression (PHQ2-9): Low Risk  (09/12/2021)  Financial Resource Strain: Low Risk  (12/15/2022)   Received from [facility]  Physical Activity: Inactive (10/29/2020)  Social Connections: Moderately Isolated (10/29/2020)  Stress: No Stress Concern Present (10/29/2020)  Tobacco Use: Low Risk  (02/07/2023)    Readmission Risk Interventions    02/07/2023   12:33 PM  Readmission Risk Prevention Plan  Transportation Screening Complete  Medication Review (RN Care  Manager) Complete  PCP or Specialist appointment within 3-5 days of discharge Complete  SW Recovery Care/Counseling Consult Complete  Palliative Care Screening Not Applicable  Skilled Nursing Facility Complete

## 2023-02-20 DIAGNOSIS — R195 Other fecal abnormalities: Secondary | ICD-10-CM | POA: Diagnosis not present
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
## 2023-02-20 NOTE — TOC Transition Note (Signed)
 Transition of Care [facility]) - Discharge Note   Patient Details  Name: Robert [name] MRN: [id number] Date of Birth: [birth year]  Transition of Care [facility]) CM/SW Contact:  Lauraine JAYSON Carpen, LCSW Phone Number: 02/20/2023, 11:57 AM   Clinical Narrative:   Patient has orders to discharge to [facility] today. RN will call report to [phone] (Room 506). EMS has been arranged and he is 2nd on the list. No further concerns. CSW signing off.  Final next level of care: Skilled Nursing Facility Barriers to Discharge: Barriers Resolved   Patient Goals and CMS Choice     Choice offered to / list presented to : Patient, Spouse      Discharge Placement   Existing PASRR number confirmed : 02/07/23          Patient [facility] bed at: [facility] Patient to be transferred to facility by: EMS Name of family member notified: Left voicemail for wife [name] Patient and family notified of of transfer: 02/20/23  Discharge Plan and Services Additional resources added to the After Visit Summary for       Post Acute Care Choice: Resumption of Svcs/PTA Provider                               Social Drivers of Health (SDOH) Interventions SDOH Screenings   Food Insecurity: No Food Insecurity (02/07/2023)  Housing: Low Risk  (02/07/2023)  Transportation Needs: No Transportation Needs (02/07/2023)  Utilities: Not At Risk (02/07/2023)  Alcohol Screen: Low Risk  (10/29/2020)  Depression (PHQ2-9): Low Risk  (09/12/2021)  Financial Resource Strain: Low Risk  (12/15/2022)   Received from [facility]  Physical Activity: Inactive (10/29/2020)  Social Connections: Moderately Isolated (10/29/2020)  Stress: No Stress Concern Present (10/29/2020)  Tobacco Use: Low Risk  (02/07/2023)     Readmission Risk Interventions    02/07/2023   12:33 PM  Readmission Risk Prevention Plan  Transportation Screening Complete  Medication Review (RN Care Manager) Complete   PCP or Specialist appointment within 3-5 days of discharge Complete  SW Recovery Care/Counseling Consult Complete  Palliative Care Screening Not Applicable  Skilled Nursing Facility Complete

## 2023-02-20 NOTE — Plan of Care (Signed)

## 2023-03-12 ENCOUNTER — Other Ambulatory Visit: Payer: Self-pay | Admitting: Sports Medicine

## 2023-03-12 ENCOUNTER — Ambulatory Visit: Payer: Medicare Other | Admitting: Urology

## 2023-03-12 DIAGNOSIS — R0989 Other specified symptoms and signs involving the circulatory and respiratory systems: Secondary | ICD-10-CM

## 2023-03-12 DIAGNOSIS — I2510 Atherosclerotic heart disease of native coronary artery without angina pectoris: Secondary | ICD-10-CM

## 2023-03-13 ENCOUNTER — Ambulatory Visit
Admission: RE | Admit: 2023-03-13 | Discharge: 2023-03-13 | Disposition: A | Discharge status: Home / Self Care | Payer: Medicare Other | Source: Ambulatory Visit | Attending: Sports Medicine | Admitting: Sports Medicine

## 2023-03-13 DIAGNOSIS — R0989 Other specified symptoms and signs involving the circulatory and respiratory systems: Secondary | ICD-10-CM

## 2023-03-13 DIAGNOSIS — I2510 Atherosclerotic heart disease of native coronary artery without angina pectoris: Secondary | ICD-10-CM

## 2023-03-17 ENCOUNTER — Emergency Department: Payer: Medicare Other

## 2023-03-17 ENCOUNTER — Other Ambulatory Visit: Payer: Self-pay

## 2023-03-17 ENCOUNTER — Emergency Department
Admission: EM | Admit: 2023-03-17 | Discharge: 2023-03-17 | Disposition: A | Discharge status: Home / Self Care | Payer: Medicare Other | Attending: Emergency Medicine | Admitting: Emergency Medicine

## 2023-03-17 ENCOUNTER — Encounter: Payer: Self-pay | Admitting: Intensive Care

## 2023-03-17 DIAGNOSIS — Z9981 Dependence on supplemental oxygen: Secondary | ICD-10-CM | POA: Insufficient documentation

## 2023-03-17 DIAGNOSIS — R059 Cough, unspecified: Secondary | ICD-10-CM | POA: Diagnosis present

## 2023-03-17 DIAGNOSIS — R519 Headache, unspecified: Secondary | ICD-10-CM | POA: Insufficient documentation

## 2023-03-17 DIAGNOSIS — J90 Pleural effusion, not elsewhere classified: Secondary | ICD-10-CM | POA: Diagnosis not present

## 2023-03-17 DIAGNOSIS — R9431 Abnormal electrocardiogram [ECG] [EKG]: Secondary | ICD-10-CM | POA: Diagnosis not present

## 2023-03-17 DIAGNOSIS — J329 Chronic sinusitis, unspecified: Secondary | ICD-10-CM | POA: Insufficient documentation

## 2023-03-17 DIAGNOSIS — U071 COVID-19: Secondary | ICD-10-CM | POA: Diagnosis not present

## 2023-03-17 DIAGNOSIS — I10 Essential (primary) hypertension: Secondary | ICD-10-CM | POA: Diagnosis not present

## 2023-03-17 DIAGNOSIS — D75839 Thrombocytosis, unspecified: Secondary | ICD-10-CM | POA: Diagnosis not present

## 2023-03-17 DIAGNOSIS — D649 Anemia, unspecified: Secondary | ICD-10-CM | POA: Insufficient documentation

## 2023-03-17 DIAGNOSIS — M6281 Muscle weakness (generalized): Secondary | ICD-10-CM | POA: Diagnosis present

## 2023-03-17 DIAGNOSIS — E119 Type 2 diabetes mellitus without complications: Secondary | ICD-10-CM | POA: Diagnosis not present

## 2023-03-17 LAB — CBC WITH DIFFERENTIAL/PLATELET
Abs Immature Granulocytes: 0.18 10*3/uL — ABNORMAL HIGH (ref 0.00–0.07)
Abs Immature Granulocytes: 0.24 10*3/uL — ABNORMAL HIGH (ref 0.00–0.07)
Basophils Absolute: 0 10*3/uL (ref 0.0–0.1)
Basophils Absolute: 0 10*3/uL (ref 0.0–0.1)
Basophils Relative: 0 %
Basophils Relative: 0 %
Eosinophils Absolute: 0 10*3/uL (ref 0.0–0.5)
Eosinophils Absolute: 0 10*3/uL (ref 0.0–0.5)
Eosinophils Relative: 0 %
Eosinophils Relative: 0 %
HCT: 28.6 % — ABNORMAL LOW (ref 39.0–52.0)
HCT: 27.7 % — ABNORMAL LOW (ref 39.0–52.0)
Hemoglobin: 8.1 g/dL — ABNORMAL LOW (ref 13.0–17.0)
Hemoglobin: 7.9 g/dL — ABNORMAL LOW (ref 13.0–17.0)
Immature Granulocytes: 2 %
Immature Granulocytes: 2 %
Lymphocytes Relative: 43 %
Lymphocytes Relative: 37 %
Lymphs Abs: 3.6 10*3/uL (ref 0.7–4.0)
Lymphs Abs: 3.7 10*3/uL (ref 0.7–4.0)
MCH: 27.1 pg (ref 26.0–34.0)
MCH: 27.1 pg (ref 26.0–34.0)
MCHC: 28.3 g/dL — ABNORMAL LOW (ref 30.0–36.0)
MCHC: 28.5 g/dL — ABNORMAL LOW (ref 30.0–36.0)
MCV: 95.7 fL (ref 80.0–100.0)
MCV: 94.9 fL (ref 80.0–100.0)
Monocytes Absolute: 0.6 10*3/uL (ref 0.1–1.0)
Monocytes Absolute: 0.6 10*3/uL (ref 0.1–1.0)
Monocytes Relative: 7 %
Monocytes Relative: 6 %
Neutro Abs: 4 10*3/uL (ref 1.7–7.7)
Neutro Abs: 5.5 10*3/uL (ref 1.7–7.7)
Neutrophils Relative %: 48 %
Neutrophils Relative %: 55 %
Platelets: UNDETERMINED 10*3/uL (ref 150–400)
Platelets: 66 10*3/uL — ABNORMAL LOW (ref 150–400)
RBC: 2.99 MIL/uL — ABNORMAL LOW (ref 4.22–5.81)
RBC: 2.92 MIL/uL — ABNORMAL LOW (ref 4.22–5.81)
RDW: 18.5 % — ABNORMAL HIGH (ref 11.5–15.5)
RDW: 18.6 % — ABNORMAL HIGH (ref 11.5–15.5)
Smear Review: UNDETERMINED
Smear Review: DECREASED
WBC: 8.3 10*3/uL (ref 4.0–10.5)
WBC: 10 10*3/uL (ref 4.0–10.5)
nRBC: 1.2 % — ABNORMAL HIGH (ref 0.0–0.2)
nRBC: 1.2 % — ABNORMAL HIGH (ref 0.0–0.2)

## 2023-03-17 LAB — TYPE AND SCREEN
ABO/RH(D): AB POS
Antibody Screen: NEGATIVE

## 2023-03-17 LAB — COMPREHENSIVE METABOLIC PANEL
ALT: 10 U/L (ref 0–44)
AST: 19 U/L (ref 15–41)
Albumin: 1.8 g/dL — ABNORMAL LOW (ref 3.5–5.0)
Alkaline Phosphatase: 69 U/L (ref 38–126)
Anion gap: 9 (ref 5–15)
BUN: 23 mg/dL (ref 8–23)
CO2: 29 mmol/L (ref 22–32)
Calcium: 8.7 mg/dL — ABNORMAL LOW (ref 8.9–10.3)
Chloride: 101 mmol/L (ref 98–111)
Creatinine, Ser: 1.33 mg/dL — ABNORMAL HIGH (ref 0.61–1.24)
GFR, Estimated: 55 mL/min — ABNORMAL LOW (ref >60)
Glucose, Bld: 145 mg/dL — ABNORMAL HIGH (ref 70–99)
Potassium: 3.2 mmol/L — ABNORMAL LOW (ref 3.5–5.1)
Sodium: 139 mmol/L (ref 135–145)
Total Bilirubin: 0.8 mg/dL (ref 0.0–1.2)
Total Protein: 8 g/dL (ref 6.5–8.1)

## 2023-03-17 LAB — RESP PANEL BY RT-PCR (RSV, FLU A&B, COVID)  RVPGX2
Influenza A by PCR: NEGATIVE
Influenza B by PCR: NEGATIVE
Resp Syncytial Virus by PCR: NEGATIVE
SARS Coronavirus 2 by RT PCR: POSITIVE — AB

## 2023-03-17 LAB — PROTIME-INR
INR: 1.4 — ABNORMAL HIGH (ref 0.8–1.2)
Prothrombin Time: 17.4 s — ABNORMAL HIGH (ref 11.4–15.2)

## 2023-03-17 MED ORDER — AMOXICILLIN-POT CLAVULANATE 875-125 MG PO TABS: 1.0000 | ORAL_TABLET | Freq: Two times a day (BID) | ORAL | 0 refills | Status: AC

## 2023-03-17 NOTE — Discharge Instructions (Addendum)
  Platelets on repeat were 60s here which is around his baseline.  He needs to follow-up outpatient with hematology.  Referral placed. He should return if he develops any bleeding but he had no blood in his stool, no bleeding noted.    IMPRESSION: 1. No acute intracranial abnormality or significant interval change. 2. Stable atrophy and white matter disease. This likely reflects the sequela of chronic microvascular ischemia. 3. Extensive sinus disease. This could be the etiology of the patient's headache. 4. Left middle ear and mastoid effusion.

## 2023-03-17 NOTE — ED Provider Triage Note (Signed)
Emergency Medicine Provider Triage Evaluation Note  Robert [name] , a 78 y.o. male  was evaluated in triage.  Pt complains of cough and low platelets. Sent from [facility] for platelets of 40. Patient is unsure if he has had bleeding anywhere. Reports cough x1-2 weeks. Unsure if black/bloody stool. Wears 3L [place] at all times.   Review of Systems  Positive: Cough Negative: fever  Physical Exam  BP 104/61 (BP Location: Left Arm)   Pulse 88   Temp 97.6 F (36.4 C) (Oral)   Resp 18   SpO2 94%  Gen:   Awake, no distress   Resp:  Normal effort  MSK:   Moves extremities without difficulty  Other:    Medical Decision Making  Medically screening exam initiated at 1:47 PM.  Appropriate orders placed.  Robert [name] was informed that the remainder of the evaluation will be completed by another provider, this initial triage assessment does not replace that evaluation, and the importance of remaining in the ED until their evaluation is complete.     [name], PA-C 03/17/23 1349

## 2023-03-17 NOTE — ED Provider Notes (Addendum)
[facility] Provider Note    Event Date/Time   First MD Initiated Contact with Patient 03/17/23 1637     (approximate)   History   No chief complaint on file.   HPI  Robert [name] is a 78 y.o. male with type 2 diabetes, hypertension, hyperlipidemia, peripheral vascular disease, history of GI bleeding who comes in with abnormal labs.  On review of records patient was admitted from 12/17 until 12/31 for anemia there was no signs of any overt bleeding and so patient was plan to follow-up outpatient with hematology.  Patient comes in today due to concern for cough, low platelets.  He wears 3 L at all times.  Patient himself denies any concerns.  I did call the facility who stated that they sent him over just because his platelets were less than 50.  When I tried to add if there was any other concerns that they had they said maybe that he was a little bit more sleepy than normal but no big overt change that they noticed.  Physical Exam   Triage Vital Signs: ED Triage Vitals  Encounter Vitals Group     BP 03/17/23 1346 104/61     Systolic BP Percentile --      Diastolic BP Percentile --      Pulse Rate 03/17/23 1346 88     Resp 03/17/23 1346 18     Temp 03/17/23 1346 97.6 F (36.4 C)     Temp Source 03/17/23 1346 Oral     SpO2 03/17/23 1346 94 %     Weight 03/17/23 1347 220 lb (99.8 kg)     Height 03/17/23 1347 6\' 2"  (1.88 m)     Head Circumference --      Peak Flow --      Pain Score 03/17/23 1347 0     Pain Loc --      Pain Education --      Exclude from Growth Chart --     Most recent vital signs: Vitals:   03/17/23 1346  BP: 104/61  Pulse: 88  Resp: 18  Temp: 97.6 F (36.4 C)  SpO2: 94%     General: Awake, no distress.  CV:  Good peripheral perfusion.  Resp:  Normal effort.  Abd:  No distention.  Soft and nontender Other:  Moving all extremities well. Rectal exam Brown stool Hemoccult negative Sputum that he coughed up was  evaluated without any blood in it No nasal bleeding.  ED Results / Procedures / Treatments   Labs (all labs ordered are listed, but only abnormal results are displayed) Labs Reviewed  RESP PANEL BY RT-PCR (RSV, FLU A&B, COVID)  RVPGX2 - Abnormal; Notable for the following components:      Result Value   SARS Coronavirus 2 by RT PCR POSITIVE (*)    All other components within normal limits  COMPREHENSIVE METABOLIC PANEL - Abnormal; Notable for the following components:   Potassium 3.2 (*)    Glucose, Bld 145 (*)    Creatinine, Ser 1.33 (*)    Calcium 8.7 (*)    Albumin 1.8 (*)    GFR, Estimated 55 (*)    All other components within normal limits  CBC WITH DIFFERENTIAL/PLATELET - Abnormal; Notable for the following components:   RBC 2.99 (*)    Hemoglobin 8.1 (*)    HCT 28.6 (*)    MCHC 28.3 (*)    RDW 18.5 (*)    nRBC 1.2 (*)  Abs Immature Granulocytes 0.18 (*)    All other components within normal limits  PROTIME-INR - Abnormal; Notable for the following components:   Prothrombin Time 17.4 (*)    INR 1.4 (*)    All other components within normal limits  TYPE AND SCREEN     EKG  My interpretation of EKG:  Sinus rate of 84 without any ST elevation, some artifact noted 1 2 and 3.  She got some T wave inversions in aVL V4 through V6  RADIOLOGY I have reviewed the CT head personally interpreted and no evidence of intracranial hemorrhage  PROCEDURES:  Critical Care performed: No  Procedures   MEDICATIONS ORDERED IN ED: Medications - No data to display   IMPRESSION / MDM / ASSESSMENT AND PLAN / ED COURSE  I reviewed the triage vital signs and the nursing notes.   Patient's presentation is most consistent with acute presentation with potential threat to life or bodily function.   Patient is positive for COVID.  CMP shows stable creatinine of 1.33.  CBC shows hemoglobin of 8.1 that similar to his priors.  His platelets have platelet clumps and they are unable  to estimate, INR is at baseline.   5:32 PM discussed with facility and they do report that patient has been maybe a little bit more sleepy than normal but otherwise that he had COVID 2 weeks ago and that he is on his baseline 3 L of oxygen.  They had known that he had had COVID at that he has been off isolation in regards to that.  I discussed with Dr. Cathie Hoops who recommended a send out for pathology.  Discussed with lab and they stated that this cannot be typically done over the weekend.  She also Dr. Hendricks Limes recommended a citric platelet level but this also is a send out and is not done until Monday.  I discussed with lab and we can send a repeat CBC.  However his platelets were 40 on a check done on 1/24 which was 1 day ago so my suspicion is that has not gone significantly lower to require platelet transfusion is that I did a rectal exam patient has no black stool Hemoccult negative.  Is got no evidence of bleeding denies any nosebleeds, coughing up blood.   No back pain abdominal pain to suggest retroperitoneal bleed.   Repeat hemoglobin is 7.9 similar to a few hours ago no significant change.  His platelets are 66.  I do not see that he is ever had a stent put in so do not feel like he needs to be transfused for less than 8.  IMPRESSION: 1. No acute intracranial abnormality or significant interval change. 2. Stable atrophy and white matter disease. This likely reflects the sequela of chronic microvascular ischemia. 3. Extensive sinus disease. This could be the etiology of the patient's headache. 4. Left middle ear and mastoid effusion.   Patient did have COVID a few weeks ago but given significant sinus disease will place on Augmentin and he can follow this up outpatient.  Patient can be discharged home.     FINAL CLINICAL IMPRESSION(S) / ED DIAGNOSES   Final diagnoses:  Thrombocytosis  Anemia, unspecified type     Rx / DC Orders   ED Discharge Orders          Ordered    Ambulatory  referral to Hematology / Oncology        03/17/23 2034    amoxicillin-clavulanate (AUGMENTIN) 875-125 MG tablet  2 times daily        03/17/23 2034             Note:  This document was prepared using Dragon voice recognition software and may include unintentional dictation errors.   Concha Se, MD 03/17/23 2035    Concha Se, MD 03/17/23 2038

## 2023-03-17 NOTE — ED Notes (Signed)
Called [facility] for transport spoke with rep. Katelyn, she stated pt. Is next to be picked up.

## 2023-03-17 NOTE — ED Notes (Signed)
First nurse note: To ED via [facility] from [facility] for abnormal lab (low platelets on routine blood draw). Pt has no complaints at all.  Alert and oriented, VSS: 149/75, HR 98, RR16, 100% on 3L chronic. Does not ambulate well on own.

## 2023-03-17 NOTE — ED Notes (Signed)
Patient transported to CT

## 2023-03-21 ENCOUNTER — Inpatient Hospital Stay: Payer: Medicare Other | Attending: Oncology | Admitting: Oncology

## 2023-03-21 ENCOUNTER — Inpatient Hospital Stay: Payer: Medicare Other

## 2023-03-21 ENCOUNTER — Encounter: Payer: Self-pay | Admitting: Oncology

## 2023-03-21 VITALS — BP 130/81 | HR 101 | Temp 96.8°F | Resp 18 | Wt 210.0 lb

## 2023-03-21 DIAGNOSIS — D649 Anemia, unspecified: Secondary | ICD-10-CM | POA: Diagnosis not present

## 2023-03-21 DIAGNOSIS — Z823 Family history of stroke: Secondary | ICD-10-CM | POA: Diagnosis not present

## 2023-03-21 DIAGNOSIS — C187 Malignant neoplasm of sigmoid colon: Secondary | ICD-10-CM | POA: Insufficient documentation

## 2023-03-21 DIAGNOSIS — Z7902 Long term (current) use of antithrombotics/antiplatelets: Secondary | ICD-10-CM | POA: Diagnosis not present

## 2023-03-21 DIAGNOSIS — I10 Essential (primary) hypertension: Secondary | ICD-10-CM | POA: Insufficient documentation

## 2023-03-21 DIAGNOSIS — D122 Benign neoplasm of ascending colon: Secondary | ICD-10-CM | POA: Diagnosis not present

## 2023-03-21 DIAGNOSIS — Z79899 Other long term (current) drug therapy: Secondary | ICD-10-CM | POA: Diagnosis not present

## 2023-03-21 DIAGNOSIS — I6523 Occlusion and stenosis of bilateral carotid arteries: Secondary | ICD-10-CM | POA: Insufficient documentation

## 2023-03-21 DIAGNOSIS — E785 Hyperlipidemia, unspecified: Secondary | ICD-10-CM | POA: Insufficient documentation

## 2023-03-21 DIAGNOSIS — J90 Pleural effusion, not elsewhere classified: Secondary | ICD-10-CM | POA: Insufficient documentation

## 2023-03-21 DIAGNOSIS — I700 Atherosclerosis of aorta: Secondary | ICD-10-CM | POA: Diagnosis not present

## 2023-03-21 DIAGNOSIS — Z8249 Family history of ischemic heart disease and other diseases of the circulatory system: Secondary | ICD-10-CM | POA: Diagnosis not present

## 2023-03-21 DIAGNOSIS — J984 Other disorders of lung: Secondary | ICD-10-CM | POA: Diagnosis not present

## 2023-03-21 LAB — COMPREHENSIVE METABOLIC PANEL
ALT: 10 U/L (ref 0–44)
AST: 22 U/L (ref 15–41)
Albumin: 1.8 g/dL — ABNORMAL LOW (ref 3.5–5.0)
Alkaline Phosphatase: 82 U/L (ref 38–126)
Anion gap: 10 (ref 5–15)
BUN: 23 mg/dL (ref 8–23)
CO2: 29 mmol/L (ref 22–32)
Calcium: 8.6 mg/dL — ABNORMAL LOW (ref 8.9–10.3)
Chloride: 95 mmol/L — ABNORMAL LOW (ref 98–111)
Creatinine, Ser: 1.3 mg/dL — ABNORMAL HIGH (ref 0.61–1.24)
GFR, Estimated: 56 mL/min — ABNORMAL LOW (ref >60)
Glucose, Bld: 164 mg/dL — ABNORMAL HIGH (ref 70–99)
Potassium: 2.9 mmol/L — ABNORMAL LOW (ref 3.5–5.1)
Sodium: 134 mmol/L — ABNORMAL LOW (ref 135–145)
Total Bilirubin: 0.6 mg/dL (ref 0.0–1.2)
Total Protein: 8.8 g/dL — ABNORMAL HIGH (ref 6.5–8.1)

## 2023-03-21 LAB — CBC WITH DIFFERENTIAL/PLATELET
Abs Immature Granulocytes: 0.09 10*3/uL — ABNORMAL HIGH (ref 0.00–0.07)
Basophils Absolute: 0 10*3/uL (ref 0.0–0.1)
Basophils Relative: 0 %
Eosinophils Absolute: 0 10*3/uL (ref 0.0–0.5)
Eosinophils Relative: 0 %
HCT: 29.1 % — ABNORMAL LOW (ref 39.0–52.0)
Hemoglobin: 8.3 g/dL — ABNORMAL LOW (ref 13.0–17.0)
Immature Granulocytes: 1 %
Lymphocytes Relative: 49 %
Lymphs Abs: 3.4 10*3/uL (ref 0.7–4.0)
MCH: 26.8 pg (ref 26.0–34.0)
MCHC: 28.5 g/dL — ABNORMAL LOW (ref 30.0–36.0)
MCV: 93.9 fL (ref 80.0–100.0)
Monocytes Absolute: 0.4 10*3/uL (ref 0.1–1.0)
Monocytes Relative: 6 %
Neutro Abs: 3 10*3/uL (ref 1.7–7.7)
Neutrophils Relative %: 44 %
Platelets: 79 10*3/uL — ABNORMAL LOW (ref 150–400)
RBC: 3.1 MIL/uL — ABNORMAL LOW (ref 4.22–5.81)
RDW: 19.9 % — ABNORMAL HIGH (ref 11.5–15.5)
Smear Review: DECREASED
WBC Morphology: ABNORMAL
WBC: 6.9 10*3/uL (ref 4.0–10.5)
nRBC: 0.7 % — ABNORMAL HIGH (ref 0.0–0.2)

## 2023-03-21 LAB — IRON AND TIBC: Iron: 74 ug/dL (ref 45–182)

## 2023-03-21 LAB — IMMATURE PLATELET FRACTION: Immature Platelet Fraction: 11.3 % — ABNORMAL HIGH (ref 1.2–8.6)

## 2023-03-21 LAB — FERRITIN: Ferritin: 1439 ng/mL — ABNORMAL HIGH (ref 24–336)

## 2023-03-21 LAB — FOLATE: Folate: 5.7 ng/mL — ABNORMAL LOW (ref >5.9)

## 2023-03-21 LAB — VITAMIN B12: Vitamin B-12: 1213 pg/mL — ABNORMAL HIGH (ref 180–914)

## 2023-03-21 LAB — RETICULOCYTES
Immature Retic Fract: 37.2 % — ABNORMAL HIGH (ref 2.3–15.9)
RBC.: 3 MIL/uL — ABNORMAL LOW (ref 4.22–5.81)
Retic Count, Absolute: 40.5 10*3/uL (ref 19.0–186.0)
Retic Ct Pct: 1.4 % (ref 0.4–3.1)

## 2023-03-21 LAB — TSH: TSH: 2.737 u[IU]/mL (ref 0.350–4.500)
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
## 2023-03-22 LAB — KAPPA/LAMBDA LIGHT CHAINS
Kappa free light chain: 233.5 mg/L — ABNORMAL HIGH (ref 3.3–19.4)
Kappa, lambda light chain ratio: 1.06 (ref 0.26–1.65)
Lambda free light chains: 219.8 mg/L — ABNORMAL HIGH (ref 5.7–26.3)

## 2023-03-22 LAB — HAPTOGLOBIN: Haptoglobin: 333 mg/dL (ref 34–355)

## 2023-03-26 LAB — MULTIPLE MYELOMA PANEL, SERUM
Albumin SerPl Elph-Mcnc: 2 g/dL — ABNORMAL LOW (ref 2.9–4.4)
Albumin/Glob SerPl: 0.4 — ABNORMAL LOW (ref 0.7–1.7)
Alpha 1: 0.4 g/dL (ref 0.0–0.4)
Alpha2 Glob SerPl Elph-Mcnc: 0.9 g/dL (ref 0.4–1.0)
B-Globulin SerPl Elph-Mcnc: 1 g/dL (ref 0.7–1.3)
Gamma Glob SerPl Elph-Mcnc: 4 g/dL — ABNORMAL HIGH (ref 0.4–1.8)
Globulin, Total: 6.3 g/dL — ABNORMAL HIGH (ref 2.2–3.9)
IgA: 887 mg/dL — ABNORMAL HIGH (ref 61–437)
IgG (Immunoglobin G), Serum: 4149 mg/dL — ABNORMAL HIGH (ref 603–1613)
IgM (Immunoglobulin M), Srm: 120 mg/dL (ref 15–143)
Total Protein ELP: 8.3 g/dL (ref 6.0–8.5)

## 2023-03-28 ENCOUNTER — Ambulatory Visit: Payer: Medicare Other

## 2023-03-29 ENCOUNTER — Telehealth: Payer: Self-pay | Admitting: Oncology

## 2023-03-29 NOTE — Telephone Encounter (Signed)
 Pt no showed to US  on 03/28/23. Left VM to call us  back to reschedule appt. Follow up is with Dr. Randy Buttery 04/04/23

## 2023-04-03 ENCOUNTER — Emergency Department: Payer: Medicare Other

## 2023-04-03 ENCOUNTER — Inpatient Hospital Stay
Admission: EM | Admit: 2023-04-03 | Discharge: 2023-04-12 | DRG: 871 | Disposition: A | Discharge status: Skilled Nursing Facility | Payer: Medicare Other | Source: Skilled Nursing Facility | Attending: Osteopathic Medicine | Admitting: Osteopathic Medicine

## 2023-04-03 ENCOUNTER — Ambulatory Visit: Payer: Medicare Other | Attending: Oncology

## 2023-04-03 ENCOUNTER — Other Ambulatory Visit: Payer: Self-pay

## 2023-04-03 DIAGNOSIS — N1832 Chronic kidney disease, stage 3b: Secondary | ICD-10-CM | POA: Diagnosis present

## 2023-04-03 DIAGNOSIS — I5022 Chronic systolic (congestive) heart failure: Secondary | ICD-10-CM | POA: Diagnosis present

## 2023-04-03 DIAGNOSIS — E278 Other specified disorders of adrenal gland: Secondary | ICD-10-CM | POA: Diagnosis not present

## 2023-04-03 DIAGNOSIS — D696 Thrombocytopenia, unspecified: Secondary | ICD-10-CM | POA: Diagnosis present

## 2023-04-03 DIAGNOSIS — L97519 Non-pressure chronic ulcer of other part of right foot with unspecified severity: Secondary | ICD-10-CM | POA: Diagnosis present

## 2023-04-03 DIAGNOSIS — D692 Other nonthrombocytopenic purpura: Secondary | ICD-10-CM | POA: Diagnosis present

## 2023-04-03 DIAGNOSIS — B961 Klebsiella pneumoniae [K. pneumoniae] as the cause of diseases classified elsewhere: Secondary | ICD-10-CM | POA: Diagnosis present

## 2023-04-03 DIAGNOSIS — L97529 Non-pressure chronic ulcer of other part of left foot with unspecified severity: Secondary | ICD-10-CM | POA: Diagnosis present

## 2023-04-03 DIAGNOSIS — D631 Anemia in chronic kidney disease: Secondary | ICD-10-CM | POA: Diagnosis present

## 2023-04-03 DIAGNOSIS — Z9981 Dependence on supplemental oxygen: Secondary | ICD-10-CM

## 2023-04-03 DIAGNOSIS — I959 Hypotension, unspecified: Secondary | ICD-10-CM | POA: Diagnosis present

## 2023-04-03 DIAGNOSIS — Z823 Family history of stroke: Secondary | ICD-10-CM

## 2023-04-03 DIAGNOSIS — N189 Chronic kidney disease, unspecified: Secondary | ICD-10-CM | POA: Diagnosis present

## 2023-04-03 DIAGNOSIS — I2510 Atherosclerotic heart disease of native coronary artery without angina pectoris: Secondary | ICD-10-CM | POA: Diagnosis present

## 2023-04-03 DIAGNOSIS — G9341 Metabolic encephalopathy: Secondary | ICD-10-CM | POA: Diagnosis present

## 2023-04-03 DIAGNOSIS — A498 Other bacterial infections of unspecified site: Secondary | ICD-10-CM

## 2023-04-03 DIAGNOSIS — R6520 Severe sepsis without septic shock: Secondary | ICD-10-CM | POA: Diagnosis present

## 2023-04-03 DIAGNOSIS — N179 Acute kidney failure, unspecified: Secondary | ICD-10-CM

## 2023-04-03 DIAGNOSIS — J9611 Chronic respiratory failure with hypoxia: Secondary | ICD-10-CM | POA: Diagnosis present

## 2023-04-03 DIAGNOSIS — I130 Hypertensive heart and chronic kidney disease with heart failure and stage 1 through stage 4 chronic kidney disease, or unspecified chronic kidney disease: Secondary | ICD-10-CM | POA: Diagnosis present

## 2023-04-03 DIAGNOSIS — J189 Pneumonia, unspecified organism: Secondary | ICD-10-CM

## 2023-04-03 DIAGNOSIS — A419 Sepsis, unspecified organism: Secondary | ICD-10-CM | POA: Diagnosis not present

## 2023-04-03 DIAGNOSIS — D61818 Other pancytopenia: Secondary | ICD-10-CM | POA: Diagnosis present

## 2023-04-03 DIAGNOSIS — N390 Urinary tract infection, site not specified: Secondary | ICD-10-CM | POA: Diagnosis present

## 2023-04-03 DIAGNOSIS — W19XXXA Unspecified fall, initial encounter: Secondary | ICD-10-CM | POA: Diagnosis not present

## 2023-04-03 DIAGNOSIS — Z1612 Extended spectrum beta lactamase (ESBL) resistance: Secondary | ICD-10-CM | POA: Diagnosis present

## 2023-04-03 DIAGNOSIS — Z85038 Personal history of other malignant neoplasm of large intestine: Secondary | ICD-10-CM

## 2023-04-03 DIAGNOSIS — I2489 Other forms of acute ischemic heart disease: Secondary | ICD-10-CM | POA: Diagnosis present

## 2023-04-03 DIAGNOSIS — Z515 Encounter for palliative care: Secondary | ICD-10-CM

## 2023-04-03 DIAGNOSIS — E1122 Type 2 diabetes mellitus with diabetic chronic kidney disease: Secondary | ICD-10-CM | POA: Diagnosis present

## 2023-04-03 DIAGNOSIS — A4159 Other Gram-negative sepsis: Secondary | ICD-10-CM | POA: Diagnosis not present

## 2023-04-03 DIAGNOSIS — E876 Hypokalemia: Secondary | ICD-10-CM | POA: Diagnosis present

## 2023-04-03 DIAGNOSIS — Z6828 Body mass index (BMI) 28.0-28.9, adult: Secondary | ICD-10-CM

## 2023-04-03 DIAGNOSIS — Z87440 Personal history of urinary (tract) infections: Secondary | ICD-10-CM

## 2023-04-03 DIAGNOSIS — J440 Chronic obstructive pulmonary disease with acute lower respiratory infection: Secondary | ICD-10-CM | POA: Diagnosis present

## 2023-04-03 DIAGNOSIS — R531 Weakness: Secondary | ICD-10-CM | POA: Diagnosis not present

## 2023-04-03 DIAGNOSIS — D638 Anemia in other chronic diseases classified elsewhere: Secondary | ICD-10-CM | POA: Diagnosis not present

## 2023-04-03 DIAGNOSIS — R6510 Systemic inflammatory response syndrome (SIRS) of non-infectious origin without acute organ dysfunction: Secondary | ICD-10-CM

## 2023-04-03 DIAGNOSIS — R7881 Bacteremia: Secondary | ICD-10-CM | POA: Diagnosis not present

## 2023-04-03 DIAGNOSIS — Z8249 Family history of ischemic heart disease and other diseases of the circulatory system: Secondary | ICD-10-CM

## 2023-04-03 DIAGNOSIS — Z79899 Other long term (current) drug therapy: Secondary | ICD-10-CM

## 2023-04-03 DIAGNOSIS — R4182 Altered mental status, unspecified: Secondary | ICD-10-CM | POA: Diagnosis not present

## 2023-04-03 DIAGNOSIS — C9200 Acute myeloblastic leukemia, not having achieved remission: Secondary | ICD-10-CM | POA: Diagnosis present

## 2023-04-03 DIAGNOSIS — N281 Cyst of kidney, acquired: Secondary | ICD-10-CM | POA: Diagnosis present

## 2023-04-03 DIAGNOSIS — E11621 Type 2 diabetes mellitus with foot ulcer: Secondary | ICD-10-CM | POA: Diagnosis present

## 2023-04-03 DIAGNOSIS — I5023 Acute on chronic systolic (congestive) heart failure: Secondary | ICD-10-CM | POA: Insufficient documentation

## 2023-04-03 DIAGNOSIS — G9340 Encephalopathy, unspecified: Secondary | ICD-10-CM | POA: Diagnosis not present

## 2023-04-03 DIAGNOSIS — A4150 Gram-negative sepsis, unspecified: Secondary | ICD-10-CM | POA: Diagnosis not present

## 2023-04-03 DIAGNOSIS — E785 Hyperlipidemia, unspecified: Secondary | ICD-10-CM | POA: Diagnosis present

## 2023-04-03 DIAGNOSIS — I5A Non-ischemic myocardial injury (non-traumatic): Secondary | ICD-10-CM | POA: Diagnosis present

## 2023-04-03 DIAGNOSIS — Z66 Do not resuscitate: Secondary | ICD-10-CM | POA: Diagnosis present

## 2023-04-03 DIAGNOSIS — E1151 Type 2 diabetes mellitus with diabetic peripheral angiopathy without gangrene: Secondary | ICD-10-CM | POA: Diagnosis present

## 2023-04-03 DIAGNOSIS — D649 Anemia, unspecified: Secondary | ICD-10-CM | POA: Diagnosis not present

## 2023-04-03 DIAGNOSIS — E279 Disorder of adrenal gland, unspecified: Secondary | ICD-10-CM | POA: Diagnosis present

## 2023-04-03 DIAGNOSIS — Z7189 Other specified counseling: Secondary | ICD-10-CM | POA: Diagnosis not present

## 2023-04-03 DIAGNOSIS — D72829 Elevated white blood cell count, unspecified: Secondary | ICD-10-CM

## 2023-04-03 DIAGNOSIS — N261 Atrophy of kidney (terminal): Secondary | ICD-10-CM | POA: Diagnosis present

## 2023-04-03 DIAGNOSIS — Z9049 Acquired absence of other specified parts of digestive tract: Secondary | ICD-10-CM

## 2023-04-03 DIAGNOSIS — R7989 Other specified abnormal findings of blood chemistry: Secondary | ICD-10-CM | POA: Diagnosis present

## 2023-04-03 LAB — HEMOGLOBIN AND HEMATOCRIT, BLOOD
HCT: 27.2 % — ABNORMAL LOW (ref 39.0–52.0)
HCT: 21.6 % — ABNORMAL LOW (ref 39.0–52.0)
Hemoglobin: 7.8 g/dL — ABNORMAL LOW (ref 13.0–17.0)
Hemoglobin: 6.4 g/dL — ABNORMAL LOW (ref 13.0–17.0)

## 2023-04-03 LAB — CBC WITH DIFFERENTIAL/PLATELET
Abs Immature Granulocytes: 0.42 10*3/uL — ABNORMAL HIGH (ref 0.00–0.07)
Basophils Absolute: 0 10*3/uL (ref 0.0–0.1)
Basophils Relative: 0 %
Eosinophils Absolute: 0 10*3/uL (ref 0.0–0.5)
Eosinophils Relative: 0 %
HCT: 23.7 % — ABNORMAL LOW (ref 39.0–52.0)
Hemoglobin: 6.7 g/dL — ABNORMAL LOW (ref 13.0–17.0)
Immature Granulocytes: 2 %
Lymphocytes Relative: 26 %
Lymphs Abs: 5.6 10*3/uL — ABNORMAL HIGH (ref 0.7–4.0)
MCH: 27.9 pg (ref 26.0–34.0)
MCHC: 28.3 g/dL — ABNORMAL LOW (ref 30.0–36.0)
MCV: 98.8 fL (ref 80.0–100.0)
Monocytes Absolute: 2.9 10*3/uL — ABNORMAL HIGH (ref 0.1–1.0)
Monocytes Relative: 14 %
Neutro Abs: 12.2 10*3/uL — ABNORMAL HIGH (ref 1.7–7.7)
Neutrophils Relative %: 58 %
Platelets: 45 10*3/uL — ABNORMAL LOW (ref 150–400)
RBC: 2.4 MIL/uL — ABNORMAL LOW (ref 4.22–5.81)
RDW: 22.5 % — ABNORMAL HIGH (ref 11.5–15.5)
WBC: 21.2 10*3/uL — ABNORMAL HIGH (ref 4.0–10.5)
nRBC: 0.4 % — ABNORMAL HIGH (ref 0.0–0.2)

## 2023-04-03 LAB — BLOOD CULTURE ID PANEL (REFLEXED) - BCID2

## 2023-04-03 LAB — COMPREHENSIVE METABOLIC PANEL
ALT: 11 U/L (ref 0–44)
AST: 27 U/L (ref 15–41)
Albumin: 1.6 g/dL — ABNORMAL LOW (ref 3.5–5.0)
Alkaline Phosphatase: 71 U/L (ref 38–126)
Anion gap: 13 (ref 5–15)
BUN: 41 mg/dL — ABNORMAL HIGH (ref 8–23)
CO2: 29 mmol/L (ref 22–32)
Calcium: 8.4 mg/dL — ABNORMAL LOW (ref 8.9–10.3)
Chloride: 97 mmol/L — ABNORMAL LOW (ref 98–111)
Creatinine, Ser: 2.54 mg/dL — ABNORMAL HIGH (ref 0.61–1.24)
GFR, Estimated: 25 mL/min — ABNORMAL LOW (ref >60)
Glucose, Bld: 160 mg/dL — ABNORMAL HIGH (ref 70–99)
Potassium: 3.5 mmol/L (ref 3.5–5.1)
Sodium: 139 mmol/L (ref 135–145)
Total Bilirubin: 0.9 mg/dL (ref 0.0–1.2)
Total Protein: 7.1 g/dL (ref 6.5–8.1)

## 2023-04-03 LAB — RETICULOCYTES
Immature Retic Fract: 45.1 % — ABNORMAL HIGH (ref 2.3–15.9)
RBC.: 2.6 MIL/uL — ABNORMAL LOW (ref 4.22–5.81)
Retic Count, Absolute: 49.5 10*3/uL (ref 19.0–186.0)
Retic Ct Pct: 1.9 % (ref 0.4–3.1)

## 2023-04-03 LAB — HEMOGLOBIN A1C
Hgb A1c MFr Bld: 5 % (ref 4.8–5.6)
Mean Plasma Glucose: 96.8 mg/dL

## 2023-04-03 LAB — IRON AND TIBC: Iron: 48 ug/dL (ref 45–182)

## 2023-04-03 LAB — CBG MONITORING, ED
Glucose-Capillary: 140 mg/dL — ABNORMAL HIGH (ref 70–99)
Glucose-Capillary: 136 mg/dL — ABNORMAL HIGH (ref 70–99)

## 2023-04-03 LAB — FERRITIN: Ferritin: 2882 ng/mL — ABNORMAL HIGH (ref 24–336)

## 2023-04-03 LAB — MRSA NEXT GEN BY PCR, NASAL: MRSA by PCR Next Gen: NOT DETECTED

## 2023-04-03 LAB — TROPONIN I (HIGH SENSITIVITY)
Troponin I (High Sensitivity): 78 ng/L — ABNORMAL HIGH (ref <18)
Troponin I (High Sensitivity): 101 ng/L (ref <18)

## 2023-04-03 LAB — LACTIC ACID, PLASMA
Lactic Acid, Venous: 1.7 mmol/L (ref 0.5–1.9)
Lactic Acid, Venous: 1.5 mmol/L (ref 0.5–1.9)

## 2023-04-03 LAB — PROTIME-INR
INR: 1.5 — ABNORMAL HIGH (ref 0.8–1.2)
Prothrombin Time: 18.3 s — ABNORMAL HIGH (ref 11.4–15.2)

## 2023-04-03 LAB — PREPARE RBC (CROSSMATCH)

## 2023-04-03 LAB — CK: Total CK: 65 U/L (ref 49–397)

## 2023-04-03 MED ORDER — VANCOMYCIN HCL IN DEXTROSE 1-5 GM/200ML-% IV SOLN
1000.0000 mg | Freq: Once | INTRAVENOUS | Status: AC
  Administered 2023-04-03: 1000 mg via INTRAVENOUS
  Filled 2023-04-03: qty 200

## 2023-04-03 MED ORDER — INSULIN ASPART 100 UNIT/ML IJ SOLN
0.0000 [IU] | Freq: Three times a day (TID) | INTRAMUSCULAR | Status: DC
  Administered 2023-04-03 (×2): 1 [IU] via SUBCUTANEOUS
  Administered 2023-04-04: 2 [IU] via SUBCUTANEOUS
  Administered 2023-04-04 – 2023-04-07 (×4): 1 [IU] via SUBCUTANEOUS
  Filled 2023-04-03 (×10): qty 1

## 2023-04-03 MED ORDER — DULOXETINE HCL 30 MG PO CPEP
60.0000 mg | ORAL_CAPSULE | Freq: Two times a day (BID) | ORAL | Status: DC
  Administered 2023-04-03 – 2023-04-12 (×17): 60 mg via ORAL
  Filled 2023-04-03 (×11): qty 2
  Filled 2023-04-03: qty 1
  Filled 2023-04-03 (×5): qty 2

## 2023-04-03 MED ORDER — VANCOMYCIN HCL IN DEXTROSE 1-5 GM/200ML-% IV SOLN: 1000.0000 mg | Freq: Once | INTRAVENOUS | Status: DC

## 2023-04-03 MED ORDER — MIDODRINE HCL 5 MG PO TABS
10.0000 mg | ORAL_TABLET | Freq: Once | ORAL | Status: AC
Start: 2023-04-03 — End: 2023-04-03
  Administered 2023-04-03: 10 mg via ORAL
  Filled 2023-04-03: qty 2

## 2023-04-03 MED ORDER — METRONIDAZOLE 500 MG/100ML IV SOLN
500.0000 mg | Freq: Once | INTRAVENOUS | Status: AC
  Administered 2023-04-03: 500 mg via INTRAVENOUS
  Filled 2023-04-03: qty 100

## 2023-04-03 MED ORDER — SODIUM CHLORIDE 0.9 % IV SOLN: 2.0000 g | INTRAVENOUS | Status: DC

## 2023-04-03 MED ORDER — SODIUM CHLORIDE 0.9 % IV BOLUS
500.0000 mL | Freq: Once | INTRAVENOUS | Status: AC
  Administered 2023-04-03: 500 mL via INTRAVENOUS

## 2023-04-03 MED ORDER — SODIUM CHLORIDE 0.9 % IV BOLUS (SEPSIS)
1000.0000 mL | Freq: Once | INTRAVENOUS | Status: AC
  Administered 2023-04-03: 1000 mL via INTRAVENOUS

## 2023-04-03 MED ORDER — MELATONIN 5 MG PO TABS
5.0000 mg | ORAL_TABLET | Freq: Every evening | ORAL | Status: DC | PRN
  Administered 2023-04-04 – 2023-04-11 (×6): 5 mg via ORAL
  Filled 2023-04-03 (×6): qty 1

## 2023-04-03 MED ORDER — LATANOPROST 0.005 % OP SOLN
1.0000 [drp] | Freq: Every day | OPHTHALMIC | Status: DC
  Administered 2023-04-04 – 2023-04-11 (×8): 1 [drp] via OPHTHALMIC
  Filled 2023-04-03 (×2): qty 2.5

## 2023-04-03 MED ORDER — ONDANSETRON HCL 4 MG/2ML IJ SOLN: 4.0000 mg | Freq: Four times a day (QID) | INTRAMUSCULAR | Status: DC | PRN

## 2023-04-03 MED ORDER — ONDANSETRON HCL 4 MG PO TABS: 4.0000 mg | ORAL_TABLET | Freq: Four times a day (QID) | ORAL | Status: DC | PRN

## 2023-04-03 MED ORDER — SODIUM CHLORIDE 0.9 % IV SOLN: 10.0000 mL/h | Freq: Once | INTRAVENOUS | Status: DC

## 2023-04-03 MED ORDER — SODIUM CHLORIDE 0.9 % IV BOLUS (SEPSIS): 1000.0000 mL | Freq: Once | INTRAVENOUS | Status: DC

## 2023-04-03 MED ORDER — SIMVASTATIN 20 MG PO TABS
20.0000 mg | ORAL_TABLET | Freq: Every evening | ORAL | Status: DC
  Administered 2023-04-03 – 2023-04-12 (×10): 20 mg via ORAL
  Filled 2023-04-03 (×8): qty 1
  Filled 2023-04-03: qty 2
  Filled 2023-04-03: qty 1

## 2023-04-03 MED ORDER — VANCOMYCIN VARIABLE DOSE PER UNSTABLE RENAL FUNCTION (PHARMACIST DOSING): Status: DC

## 2023-04-03 MED ORDER — SODIUM CHLORIDE 0.9 % IV SOLN
2.0000 g | Freq: Once | INTRAVENOUS | Status: AC
  Administered 2023-04-03: 2 g via INTRAVENOUS
  Filled 2023-04-03: qty 12.5

## 2023-04-03 MED ORDER — MIRTAZAPINE 15 MG PO TABS
15.0000 mg | ORAL_TABLET | Freq: Every day | ORAL | Status: DC
  Administered 2023-04-04 – 2023-04-12 (×9): 15 mg via ORAL
  Filled 2023-04-03 (×10): qty 1

## 2023-04-03 MED ORDER — SODIUM CHLORIDE 0.9 % IV SOLN
1.0000 g | Freq: Two times a day (BID) | INTRAVENOUS | Status: AC
  Administered 2023-04-03 – 2023-04-10 (×16): 1 g via INTRAVENOUS
  Filled 2023-04-03 (×17): qty 20

## 2023-04-03 MED ORDER — FERROUS SULFATE 325 (65 FE) MG PO TABS
325.0000 mg | ORAL_TABLET | Freq: Two times a day (BID) | ORAL | Status: DC
  Administered 2023-04-04 – 2023-04-12 (×17): 325 mg via ORAL
  Filled 2023-04-03 (×19): qty 1

## 2023-04-03 MED ORDER — OMEPRAZOLE 20 MG PO TBDD
20.0000 mg | DELAYED_RELEASE_TABLET | Freq: Every day | ORAL | Status: DC
  Filled 2023-04-03: qty 1

## 2023-04-03 MED ORDER — OXYBUTYNIN CHLORIDE ER 10 MG PO TB24
10.0000 mg | ORAL_TABLET | Freq: Every evening | ORAL | Status: DC
  Administered 2023-04-03 – 2023-04-12 (×9): 10 mg via ORAL
  Filled 2023-04-03 (×12): qty 1

## 2023-04-03 MED ORDER — PANTOPRAZOLE SODIUM 40 MG IV SOLR
40.0000 mg | Freq: Two times a day (BID) | INTRAVENOUS | Status: DC
  Administered 2023-04-03 – 2023-04-05 (×6): 40 mg via INTRAVENOUS
  Filled 2023-04-03 (×6): qty 10

## 2023-04-03 NOTE — ED Notes (Signed)
Patient stated they didn't have to pee right now. RN suggested In-and-out catheter. Patient Verbally refused stating "F*ck no".

## 2023-04-03 NOTE — ED Notes (Addendum)
Tommi Emery, NP about pts recent low Bps

## 2023-04-03 NOTE — Evaluation (Signed)
Clinical/Bedside Swallow Evaluation Patient Details  Name: Robert [name] MRN: [id number] Date of Birth: 15-Jan-1946  Today's Date: 04/03/2023 Time: SLP Start Time (ACUTE ONLY): 1345 SLP Stop Time (ACUTE ONLY): 1445 SLP Time Calculation (min) (ACUTE ONLY): 60 min  Past Medical History:  Past Medical History:  Diagnosis Date   Allergy    Cellulitis    Diabetes mellitus without complication (HCC)    Hyperlipidemia    Hypertension    Peripheral vascular disease (HCC)    Past Surgical History:  Past Surgical History:  Procedure Laterality Date   BIOPSY  08/21/2022   Procedure: BIOPSY;  Surgeon: [name], DO;  Location: [facility] ENDOSCOPY;  Service: Gastroenterology;;   COLONOSCOPY WITH PROPOFOL N/A 10/02/2017   Procedure: COLONOSCOPY WITH PROPOFOL;  Surgeon: Wyline Mood, MD;  Location: [facility] ENDOSCOPY;  Service: Gastroenterology;  Laterality: N/A;   COLONOSCOPY WITH PROPOFOL N/A 08/21/2022   Procedure: COLONOSCOPY WITH PROPOFOL;  Surgeon: [name], DO;  Location: [facility] ENDOSCOPY;  Service: Gastroenterology;  Laterality: N/A;   ESOPHAGOGASTRODUODENOSCOPY (EGD) WITH PROPOFOL N/A 08/11/2022   Procedure: ESOPHAGOGASTRODUODENOSCOPY (EGD) WITH PROPOFOL;  Surgeon: Midge Minium, MD;  Location: [facility] ENDOSCOPY;  Service: Endoscopy;  Laterality: N/A;   HERNIA REPAIR     PERICARDIOCENTESIS N/A 08/29/2022   Procedure: PERICARDIOCENTESIS;  Surgeon: Yvonne Kendall, MD;  Location: [facility] INVASIVE CV LAB;  Service: Cardiovascular;  Laterality: N/A;   POLYPECTOMY  08/21/2022   Procedure: POLYPECTOMY;  Surgeon: [name], DO;  Location: [facility] ENDOSCOPY;  Service: Gastroenterology;;   HPI:  Pt is a 78 y.o. male with medical history significant of recent COVID positive infection 02/2023 and recently diagnosed sigmoid colon CA status post sigmoid colon partial colectomy November 2024, PVD, DM, HTN, Cellulitis, chronic anemia secondary to CKD, CKD stage IIIA, ESBL UTI November 2024,  chronic HFrEF with LVEF 40%, pericardial effusion status post pericardial window, sent from nursing home for recomment support evaluation of a fall, hypotension and altered mentations.     Patient complained about having been feeling tired low energy for last week.  Last night, nursing home staff found the patient was on the floor awake but confused with "strong urine smell".  Patient does not remember what has happened but reports that has been feeling weak.  Denied any abdominal pain no nauseous vomiting, denied any dark-colored stool or blood in the stool.   CXR: Atelectasis and/or consolidation in the right lower lobe with small right pleural effusion.  2. Aortic atherosclerosis.  Similar noted in Imaging since 01/2023.    Assessment / Plan / Recommendation  Clinical Impression   Pt seen for BSE today. Pt awakened adequately w/ Min-Mod stim and positioning fully upright in bed -- MAX assistance SLP and NSG. Pt on bed in ED hallway. Pt was verbal to give name and preference of food/drink but paucity of speech. Weak UEs and needed FULL support w/ feeding. He followed Basic instructions w/ cues.  On [place] o2 3L; afebrile. WBC elevated.  Pt appears to present w/ functional pharyngeal phase swallowing w/ no immediate, overt clinical s/s of aspiration noted during po trials this assessment; Mild oral phase dysphagia appreciated in setting of generalized weakness and illness/hospitalization. No gross oropharyngeal phase dysphagia noted w/ a modified (foods) diet, no gross sensorimotor deficits noted.  Pt appears at reduced risk for aspiration/aspiration pneumonia when following general aspiration precautions and using a modified diet of Dysphagia level 2(minced foods). However, pt does have challenging factors that could impact oropharyngeal swallowing to include acute/chronic illness/dxs,  fatigue/weakness, and weak UE movements hampering self-feeding abilities(need for feeding support currently). These factors  can increase risk for dysphagia as well as decreased oral intake overall.   During po trials, pt consumed all consistencies w/ no overt coughing, decline in vocal quality, or change in respiratory presentation during/post trials. O2 sats mid-upper 90s when checked. Oral phase appeared grossly [facility] w/ timely bolus management and control of bolus propulsion for A-P transfer for swallowing. Increased oral phase bolus management/mastication/Time needed w/ increased textured trials of foods, even though softened/moistened. Oral clearing achieved w/ all trial consistencies -- Time required w/ the moistened, soft foods given.  OM Exam appeared grossly [facility] w/ no unilateral lingual weakness noted. Speech intelligible though low volume and mumbled at times. Pt required feeding support but could help hold Cup to drink.   Recommend a Dysphagia level 2(MINCED) consistency diet w/ well-moistened foods; Thin liquids -- carefully monitor straw use, and pt should help to Hold Cup when drinking. Recommend general aspiration precautions, sit fully upright w/ all oral intake, and reduce talking/distractions during oral intake. Small bites/sips Slowly clearing mouth fully b/t bites. Feeding support at meals. Pills CRUSHED vs WHOLE in Puree for safer, easier swallowing -- encouraged now and for D/C.  Education given on Pills in Puree; food consistency and easy to eat options; general aspiration precautions to pt and NSG. ST services will f/u w/ toleration of diet and trials to upgrade diet consistency as appropriate during admit. NSG updated, agreed. MD updated. Recommend Dietician f/u for support. SLP Visit Diagnosis: Dysphagia, oral phase (R13.11) (in setting of illness)    Aspiration Risk  Mild aspiration risk;Risk for inadequate nutrition/hydration (reduced following general aspiration precautions)    Diet Recommendation   Thin;Dysphagia 2 (chopped) (moistened well) = a Dysphagia level 2(MINCED) consistency diet w/  well-moistened foods; Thin liquids -- carefully monitor straw use, and pt should help to Hold Cup when drinking. Recommend general aspiration precautions, sit fully upright w/ all oral intake, and reduce talking/distractions during oral intake. Small bites/sips Slowly clearing mouth fully b/t bites. Feeding support at meals.   Medication Administration: Crushed with puree    Other  Recommendations Recommended Consults:  (Dietician f/u) Oral Care Recommendations: Oral care BID;Oral care before and after PO;Staff/trained caregiver to provide oral care    Recommendations for follow up therapy are one component of a multi-disciplinary discharge planning process, led by the attending physician.  Recommendations may be updated based on patient status, additional functional criteria and insurance authorization.  Follow up Recommendations Follow physician's recommendations for discharge plan and follow up therapies (at next venue of care if needed)      Assistance Recommended at Discharge  FULL at meals  Functional Status Assessment Patient has had a recent decline in their functional status and demonstrates the ability to make significant improvements in function in a reasonable and predictable amount of time.  Frequency and Duration min 2x/week  2 weeks       Prognosis Prognosis for improved oropharyngeal function: Fair (-Good) Barriers to Reach Goals:  (illness)      Swallow Study   General Date of Onset: 04/03/23 HPI: Pt is a 78 y.o. male with medical history significant of recent COVID positive infection 02/2023 and recently diagnosed sigmoid colon CA status post sigmoid colon partial colectomy November 2024, PVD, DM, HTN, Cellulitis, chronic anemia secondary to CKD, CKD stage IIIA, ESBL UTI November 2024, chronic HFrEF with LVEF 40%, pericardial effusion status post pericardial window, sent from nursing home for  recomment support evaluation of a fall, hypotension and altered mentations.      Patient complained about having been feeling tired low energy for last week.  Last night, nursing home staff found the patient was on the floor awake but confused with "strong urine smell".  Patient does not remember what has happened but reports that has been feeling weak.  Denied any abdominal pain no nauseous vomiting, denied any dark-colored stool or blood in the stool.   CXR: Atelectasis and/or consolidation in the right lower lobe with small right pleural effusion.  2. Aortic atherosclerosis.  Similar noted in Imaging since 01/2023. Type of Study: Bedside Swallow Evaluation Previous Swallow Assessment: none Diet Prior to this Study: NPO (regular diet prior per pt) Temperature Spikes Noted: No (wbc 21.2) Respiratory Status: Nasal cannula (3L) History of Recent Intubation: No Behavior/Cognition: Alert;Cooperative;Pleasant mood;Distractible;Requires cueing (min drowsy) Oral Cavity Assessment: Dry (min) Oral Care Completed by SLP: Yes Oral Cavity - Dentition: Adequate natural dentition Vision: Functional for self-feeding Self-Feeding Abilities: Able to feed self;Needs assist;Needs set up;Total assist (weak UEs) Patient Positioning: Upright in bed (MAX assist) Baseline Vocal Quality: Normal;Low vocal intensity Volitional Cough: Strong Volitional Swallow: Able to elicit    Oral/Motor/Sensory Function Overall Oral Motor/Sensory Function: Within functional limits (grossly)   Ice Chips Ice chips: Within functional limits Presentation: Spoon (fed; 5 trials)   Thin Liquid Thin Liquid: Within functional limits Presentation: Straw (pinched for small sips; 15 trials)    Nectar Thick Nectar Thick Liquid: Not tested   Honey Thick Honey Thick Liquid: Not tested   Puree Puree: Within functional limits Presentation: Spoon (fed; 10 trials)   Solid     Solid: Impaired (min) Presentation: Spoon (fed; 8 trials) Oral Phase Impairments: Impaired mastication;Poor awareness of bolus (min) Oral Phase  Functional Implications: Prolonged oral transit;Impaired mastication (min) Pharyngeal Phase Impairments:  (none)         Jerilynn Som, MS, CCC-SLP Speech Language Pathologist Rehab Services; [facility] [place] [phone] (ascom) Porfirio Bollier 04/03/2023,5:49 PM

## 2023-04-03 NOTE — Sepsis Progress Note (Signed)
Elink monitoring for the code sepsis protocol.

## 2023-04-03 NOTE — ED Notes (Signed)
Patient transported to radiology
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
## 2023-04-03 NOTE — ED Provider Notes (Addendum)
Care assumed of patient from outgoing provider.  See their note for initial history, exam and plan.  Clinical Course as of 04/03/23 0706  Tue Apr 03, 2023  0633 Laboratory results demonstrate leukocytosis, anemia with hemoglobin less than 7, AKI.  Lactic acid is negative.  Transfuse PRBCs, initiate broad-spectrum IV antibiotics.  Pending imaging studies and UA. [JS]  [phone] Reach patient's spouse [name] at [phone] to update her and confirmed consent for blood transfusion; no answer and no ability to leave voicemail.  Given patient's low hemoglobin, concern for sepsis, I do feel benefits outweigh risks to transfuse blood. [JS]  [id number] Imaging studies noted for ICH, possible pneumonia.  Will consult hospital services for evaluation and admission. [JS]  0703 SNF with fall from chair to floor.  Concern for sepsis - leukocytosis of 23. Hgb 6.7. 1 U PRBC ordered. Cefepime for possible sepsis of unknown source. CXR ? PNA [SM]    Clinical Course User Index [JS] Irean Hong, MD [SM] Corena Herter, MD      Corena Herter, MD 04/03/23 6213    Corena Herter, MD 04/03/23 [phone]

## 2023-04-03 NOTE — TOC CM/SW Note (Signed)
CSW completed chart review. Pt from [facility] [employer] and had a witnessed fall. Pt on 3L of oxygen at baseline and concerns for Altered mental status. Due to AMS, CSW attempted to contact pt's wife to complete HRRA, no answer.

## 2023-04-03 NOTE — Progress Notes (Signed)
Wife updated on patient's conditions, all questions answered with my best knowledge.

## 2023-04-03 NOTE — Consult Note (Signed)
CODE SEPSIS - PHARMACY COMMUNICATION   **Broad Spectrum Antibiotics should be administered within 1 hour of Sepsis diagnosis**   Time Code Sepsis Called/Page Received: 1610   Antibiotics Ordered: cefepime, vancomycin, flagyl   Time of 1st antibiotic administration: [phone]   Additional action taken by pharmacy: N/A   If necessary, Name of Provider/Nurse Contacted: N/A  Will M. Dareen Piano, PharmD Clinical Pharmacist 04/03/2023 8:22 AM

## 2023-04-03 NOTE — ED Notes (Addendum)
New BP cuff place and pt blood pressure hypotensive and tachycardic. hospitalist messaged. See orders,.

## 2023-04-03 NOTE — ED Notes (Signed)
Pt chewing on pills, per note in room. Give pills with apple sauce. Pt other pill held due to pt unable to swallow

## 2023-04-03 NOTE — ED Notes (Signed)
RN attempted to gain UA sample. Patient stated they didn't have to pee right now.

## 2023-04-03 NOTE — ED Triage Notes (Signed)
From [facility]. Post fall, facility states that patient slide onto the floor. Facility reports patient has not been alert and has been altered. Baseline 3L [place].   95/49, 98%, 80, 98.7, 16, ET30

## 2023-04-03 NOTE — ED Notes (Addendum)
Messaged MD Zhang about patients HR jump into NST and low recent BP's.

## 2023-04-03 NOTE — H&P (Signed)
History and Physical    Robert [name]:[id number] DOB: 07/23/1945 DOA: 04/03/2023  PCP: Corky Downs, MD (Confirm with patient/family/NH records and if not entered, this has to be entered at [facility] point of entry) Patient coming from: SNF  I have personally briefly reviewed patient's old medical records in [facility]  Chief Complaint: Feeling tired.  HPI: Robert [name] is a 78 y.o. male with medical history significant of recently diagnosed sigmoid colon CA status post sigmoid colon partial colectomy November 2024, chronic anemia secondary to CKD, CKD stage IIIA, ESBL UTI November 2024, chronic HFrEF with LVEF 40%, pericardial effusion status post pericardial window, sent from nursing home for recomment support evaluation of a fall, hypotension and altered mentations.  Patient complained about having been feeling tired low energy for last week.  Last night, nursing home staff found the patient was on the floor awake but confused with "strong urine smell".  Patient does not remember what has happened but reports that has been feeling weak.  Denied any abdominal pain no nauseous vomiting, denied any dark-colored stool or blood in the stool.  ED Course: Blood pressure 95/49, heart rate 80, observation 98% room air temperature 98.7.  Blood work showed WBC 12.2, hemoglobin 6.7 compared to baseline 8.0 platelet 45, creatinine 2.5 compared to baseline 1.3, BUN 41, K3.5 bicarb 29 glucose 141.  Patient was given a total of 1000 mL IV bolus and started on antibiotics of cefepime and vancomycin.  Review of Systems: Unable to perform, patient is confused.  Past Medical History:  Diagnosis Date   Allergy    Cellulitis    Diabetes mellitus without complication (HCC)    Hyperlipidemia    Hypertension    Peripheral vascular disease [facility])     Past Surgical History:  Procedure Laterality Date   BIOPSY  08/21/2022   Procedure: BIOPSY;  Surgeon: [name], DO;  Location:  [facility] ENDOSCOPY;  Service: Gastroenterology;;   COLONOSCOPY WITH PROPOFOL N/A 10/02/2017   Procedure: COLONOSCOPY WITH PROPOFOL;  Surgeon: Wyline Mood, MD;  Location: [facility] ENDOSCOPY;  Service: Gastroenterology;  Laterality: N/A;   COLONOSCOPY WITH PROPOFOL N/A 08/21/2022   Procedure: COLONOSCOPY WITH PROPOFOL;  Surgeon: [name], DO;  Location: [facility] ENDOSCOPY;  Service: Gastroenterology;  Laterality: N/A;   ESOPHAGOGASTRODUODENOSCOPY (EGD) WITH PROPOFOL N/A 08/11/2022   Procedure: ESOPHAGOGASTRODUODENOSCOPY (EGD) WITH PROPOFOL;  Surgeon: Midge Minium, MD;  Location: [facility] ENDOSCOPY;  Service: Endoscopy;  Laterality: N/A;   HERNIA REPAIR     PERICARDIOCENTESIS N/A 08/29/2022   Procedure: PERICARDIOCENTESIS;  Surgeon: Yvonne Kendall, MD;  Location: [facility] INVASIVE CV LAB;  Service: Cardiovascular;  Laterality: N/A;   POLYPECTOMY  08/21/2022   Procedure: POLYPECTOMY;  Surgeon: [name], DO;  Location: [facility] ENDOSCOPY;  Service: Gastroenterology;;     reports that he has never smoked. He has never used smokeless tobacco. He reports that he does not currently use alcohol. He reports that he does not use drugs.  No Known Allergies  Family History  Problem Relation Age of Onset   Heart disease Mother    Stroke Mother    Heart disease Father    Heart attack Father     Prior to Admission medications   Medication Sig Start Date End Date Taking? Authorizing Provider  DULoxetine (CYMBALTA) 60 MG capsule Take 1 capsule (60 mg total) by mouth 2 (two) times daily. 10/11/21  Yes Masoud, Renda Rolls, MD  ferrous sulfate 325 (65 FE) MG tablet Take 1 tablet (325 mg total) by  mouth 2 (two) times daily with a meal. 10/31/22  Yes Arnetha Courser, MD  furosemide (LASIX) 40 MG tablet Take 1 tablet (40 mg total) by mouth daily. 10/31/22  Yes Arnetha Courser, MD  latanoprost (XALATAN) 0.005 % ophthalmic solution Place 1 drop into both eyes at bedtime. 07/06/15  Yes [provider]  meclizine  (ANTIVERT) 12.5 MG tablet TAKE 1 TABLET(12.5 MG) BY MOUTH TWICE DAILY AS NEEDED 01/30/22  Yes Masoud, Renda Rolls, MD  melatonin 5 MG TABS Take 5 mg by mouth at bedtime as needed.   Yes [provider]  metoprolol succinate (TOPROL-XL) 50 MG 24 hr tablet Take 1 tablet (50 mg total) by mouth daily. Take with or immediately following a meal. 09/12/22  Yes Gillis Santa, MD  mirtazapine (REMERON) 15 MG tablet Take 15 mg by mouth daily. 03/15/23  Yes [provider]  Multiple Vitamin (QUINTABS) TABS Take 1 tablet by mouth daily. 01/06/23 01/06/24 Yes [provider]  omeprazole (PRILOSEC OTC) 20 MG tablet Take 20 mg by mouth daily.   Yes [provider]  oxybutynin (DITROPAN-XL) 10 MG 24 hr tablet Take 10 mg by mouth every evening.   Yes [provider]  polyethylene glycol (MIRALAX / GLYCOLAX) 17 g packet Take 17 g by mouth daily as needed.   Yes [provider]  senna-docusate (SENOKOT-S) 8.6-50 MG tablet Take 1 tablet by mouth daily as needed for mild constipation.   Yes [provider]  simvastatin (ZOCOR) 20 MG tablet TAKE 1 TABLET BY MOUTH DAILY 04/25/21  Yes Masoud, Renda Rolls, MD  BD PEN NEEDLE NANO 2ND GEN 32G X 4 MM MISC USE 1 PEN NEEDLE EVERY DAY AS DIRECTED 01/21/21   Corky Downs, MD  insulin glargine-yfgn (SEMGLEE) 100 UNIT/ML Pen  02/08/23   [provider]    Physical Exam: Vitals:   04/03/23 0730 04/03/23 0830 04/03/23 0900 04/03/23 0930  BP: (!) 103/51 (!) 93/57 (!) 102/53 (!) 99/56  Pulse: 83 80 84 83  Resp: 17 14 15 17   Temp:    97.7 F (36.5 C)  TempSrc:    Oral  SpO2:  100% 92% 100%  Weight:      Height:        Constitutional: NAD, calm, comfortable Vitals:   04/03/23 0730 04/03/23 0830 04/03/23 0900 04/03/23 0930  BP: (!) 103/51 (!) 93/57 (!) 102/53 (!) 99/56  Pulse: 83 80 84 83  Resp: 17 14 15 17   Temp:    97.7 F (36.5 C)  TempSrc:    Oral  SpO2:  100% 92% 100%  Weight:      Height:      Chronic  ill-looking Eyes: PERRL, lids and conjunctivae normal. Pale ENMT: Mucous membranes are dry. Posterior pharynx clear of any exudate or lesions.Normal dentition.  Neck: normal, supple, no masses, no thyromegaly Respiratory: clear to auscultation bilaterally, no wheezing, no crackles. Normal respiratory effort. No accessory muscle use.  Cardiovascular: Regular rate and rhythm, no murmurs / rubs / gallops. No extremity edema. 2+ pedal pulses. No carotid bruits.  Abdomen: no tenderness, no masses palpated. No hepatosplenomegaly. Bowel sounds positive.  Musculoskeletal: no clubbing / cyanosis. No joint deformity upper and lower extremities. Good ROM, no contractures. Normal muscle tone. Multiple small ulcers on B/L toes with the  biggest one on right 2nd toe Skin: no rashes, lesions, ulcers. No induration Neurologic: CN 2-12 grossly intact. Sensation intact, DTR normal. Strength 5/5 in all 4.  Psychiatric: Normal judgment and insight. Alert and oriented  x 3. Normal mood.     Labs on Admission: I have personally reviewed following labs and imaging studies  CBC: Recent Labs  Lab 04/03/23 0521  WBC 21.2*  NEUTROABS 12.2*  HGB 6.7*  HCT 23.7*  MCV 98.8  PLT 45*   Basic Metabolic Panel: Recent Labs  Lab 04/03/23 0521  NA 139  K 3.5  CL 97*  CO2 29  GLUCOSE 160*  BUN 41*  CREATININE 2.54*  CALCIUM 8.4*   GFR: Estimated Creatinine Clearance: 27.9 mL/min (A) (by C-G formula based on SCr of 2.54 mg/dL (H)). Liver Function Tests: Recent Labs  Lab 04/03/23 0521  AST 27  ALT 11  ALKPHOS 71  BILITOT 0.9  PROT 7.1  ALBUMIN 1.6*   No results for input(s): "LIPASE", "AMYLASE" in the last 168 hours. No results for input(s): "AMMONIA" in the last 168 hours. Coagulation Profile: Recent Labs  Lab 04/03/23 0521  INR 1.5*   Cardiac Enzymes: No results for input(s): "CKTOTAL", "CKMB", "CKMBINDEX", "TROPONINI" in the last 168 hours. BNP (last 3 results) No results for input(s):  "PROBNP" in the last 8760 hours. HbA1C: No results for input(s): "HGBA1C" in the last 72 hours. CBG: No results for input(s): "GLUCAP" in the last 168 hours. Lipid Profile: No results for input(s): "CHOL", "HDL", "LDLCALC", "TRIG", "CHOLHDL", "LDLDIRECT" in the last 72 hours. Thyroid Function Tests: No results for input(s): "TSH", "T4TOTAL", "FREET4", "T3FREE", "THYROIDAB" in the last 72 hours. Anemia Panel: No results for input(s): "VITAMINB12", "FOLATE", "FERRITIN", "TIBC", "IRON", "RETICCTPCT" in the last 72 hours. Urine analysis:    Component Value Date/Time   COLORURINE YELLOW (A) 02/05/2023 0836   APPEARANCEUR HAZY (A) 02/05/2023 0836   LABSPEC 1.010 02/05/2023 0836   PHURINE 6.0 02/05/2023 0836   GLUCOSEU NEGATIVE 02/05/2023 0836   HGBUR LARGE (A) 02/05/2023 0836   BILIRUBINUR NEGATIVE 02/05/2023 0836   KETONESUR NEGATIVE 02/05/2023 0836   PROTEINUR 30 (A) 02/05/2023 0836   NITRITE NEGATIVE 02/05/2023 0836   LEUKOCYTESUR TRACE (A) 02/05/2023 0836    Radiological Exams on Admission: DG Chest Port 1 View Result Date: 04/03/2023 CLINICAL DATA:  78 year old male with history of altered mental status and fall. EXAM: PORTABLE CHEST 1 VIEW COMPARISON:  Chest x-ray 03/17/2023. FINDINGS: Opacity at the right base which may reflect atelectasis and/or consolidation, with superimposed small right pleural effusion. Left lung is clear. No definite left pleural effusion. No pneumothorax. No evidence of pulmonary edema. Heart size appears borderline enlarged. The patient is rotated to the right on today's exam, resulting in distortion of the mediastinal contours and reduced diagnostic sensitivity and specificity for mediastinal pathology. Atherosclerotic calcifications are noted in the thoracic aorta. IMPRESSION: 1. Atelectasis and/or consolidation in the right lower lobe with small right pleural effusion. 2. Aortic atherosclerosis. Electronically Signed   By: Trudie Reed M.D.   On:  04/03/2023 06:43   CT Head Wo Contrast Result Date: 04/03/2023 CLINICAL DATA:  78 year old male with history of trauma from a fall. Altered mental status. EXAM: CT HEAD WITHOUT CONTRAST TECHNIQUE: Contiguous axial images were obtained from the base of the skull through the vertex without intravenous contrast. RADIATION DOSE REDUCTION: This exam was performed according to the departmental dose-optimization program which includes automated exposure control, adjustment of the mA and/or kV according to patient size and/or use of iterative reconstruction technique. COMPARISON:  Head CT 03/17/2023. FINDINGS: Brain: Mild cerebral atrophy. Patchy and confluent areas of decreased attenuation are noted throughout the deep and periventricular white matter of the cerebral hemispheres  bilaterally, compatible with chronic microvascular ischemic disease. No evidence of acute infarction, hemorrhage, hydrocephalus, extra-axial collection or mass lesion/mass effect. Vascular: No hyperdense vessel. Atherosclerotic calcifications are again noted in the internal carotid arteries bilaterally and in the vertebral arteries bilaterally. Skull: Normal. Negative for fracture or focal lesion. Sinuses/Orbits: Multifocal mucosal thickening throughout the paranasal sinuses bilaterally, slightly improved compared to the prior examination with resolution of previously noted air-fluid levels. Other: Large left mastoid effusion, unchanged. IMPRESSION: 1. No evidence of significant acute traumatic injury to the skull or brain. 2. Mild cerebral atrophy with chronic microvascular ischemic changes in the cerebral white matter, as above. 3. Chronic paranasal sinus disease, slightly improved compared to the prior study. 4. Large left mastoid effusion, unchanged. Electronically Signed   By: Trudie Reed M.D.   On: 04/03/2023 06:41    EKG: Independently reviewed. Sinus, no acute ST-T changes.  Assessment/Plan Principal Problem:   Sepsis (HCC)   (please populate well all problems here in Problem List. (For example, if patient is on BP meds at home and you resume or decide to hold them, it is a problem that needs to be her. Same for CAD, COPD, HLD and so on)  Severe sepsis -With acute organ damage of AKI and acute encephalopathy.  Sepsis evidenced by leukocytosis, hypotension which responded to initial IV bolus.  Source infection less clear, as per report from nursing home, clinically suspect UTI.  Pneumonia cannot be ruled out.  Discussed with pharmacy, given that patient had recent ESBL UTI in November, pharmacy recommended we cover patient with meropenem plus vancomycin..  Blood culture pending -UA pending.  X-ray appeared to have right lower lobe infiltrates however patient denied any cough. -Given the patient has significant degree of reduced LVEF CHF, will hold off further IV boluses.  Start PRBC this morning. -Admit to PCU  Acute on chronic normocytic anemia -History of GI bleed secondary to sigmoid colon cancer, for which patient underwent sigmoid colon partial colectomy in November at [facility].  Expected that the GI bleeding source was removed by now.  Clinically suspect worsening of hemoglobin level secondary to CKD.  This was evidenced by low reticulocyte count on recent anemia study in January. -Check FOBT -Start patient on PPI -Repeat H&H after transfusion to determine further transfusion plan  Acute metabolic encephalopathy -Sec to sepsis, mentation appears to be improving compared to arrival as per nursing report.  Continue to treat sepsis then reevaluate tomorrow.  AKI on CKD stage IIIb -Blood pressure improving, getting PRBC.  Plan to hold off further IV fluids given history of significant low LVEF.  Elevated troponins -No chest pains, EKG showed no acute ST changes.  Suspect demand ischemia from sepsis/hypotension. -Review of patient record showed patient had stress test before colectomy in late October 2024 which showed  some ischemia infarction changes in the inferior lateral wall.  Unclear any additional ischemia workup was done on admission at [facility].   Chronic HFrEF -Most recent stress test on 12/20/2022 at [facility] showed LVEF 40%. -Currently patient appears to be mild of volume contracted, given patient also has concurrent severe anemia, will plan to do transfusion first and hold off IV fluid.  Monitor volume status. -Hold off Lasix and BP meds today.  IIDM -SSI for now  Hx of cardiac effusion status post pericardial window in July 2024 -X-ray showed no cardiomegaly -Will check echocardiogram  DVT prophylaxis: SCD Code Status: Full code Family Communication: Left wife message and waiting for a reply Disposition Plan: Patient is  septic requiring IV fluid resuscitation and IV antibiotics and severe anemia AKI requiring close monitoring and inpatient management, expect more than 2 midnight hospital stay Consults called: None Admission status: PCU admit   Emeline General MD [facility] Pager [phone]  04/03/2023, 9:32 AM

## 2023-04-03 NOTE — Progress Notes (Signed)
PHARMACY - PHYSICIAN COMMUNICATION CRITICAL VALUE ALERT - BLOOD CULTURE IDENTIFICATION (BCID)  Robert [name] is an 78 y.o. male who presented to [facility] on 04/03/2023 with a chief complaint of fall with altered mental status. History significant for ESBL Klebsiella (UCx 01/08/23).  Assessment:  2/11 BCx: 1/4 anaerobic GNR, Klebsiella pneumoniae, CTX-M ESBL   Name of physician (or Provider) Contacted: Dr. Verdene Lennert, [facility]   Current antibiotics:  Meropenem 1g Q12H (renally dose adjusted)  Changes to prescribed antibiotics recommended:  Patient is on recommended antibiotics - No changes needed  Results for orders placed or performed during the hospital encounter of 04/03/23  Blood Culture ID Panel (Reflexed) (Collected: 04/03/2023  5:22 AM)  Result Value Ref Range   Enterococcus faecalis NOT DETECTED NOT DETECTED   Enterococcus Faecium NOT DETECTED NOT DETECTED   Listeria monocytogenes NOT DETECTED NOT DETECTED   Staphylococcus species NOT DETECTED NOT DETECTED   Staphylococcus aureus (BCID) NOT DETECTED NOT DETECTED   Staphylococcus epidermidis NOT DETECTED NOT DETECTED   Staphylococcus lugdunensis NOT DETECTED NOT DETECTED   Streptococcus species NOT DETECTED NOT DETECTED   Streptococcus agalactiae NOT DETECTED NOT DETECTED   Streptococcus pneumoniae NOT DETECTED NOT DETECTED   Streptococcus pyogenes NOT DETECTED NOT DETECTED   A.calcoaceticus-baumannii NOT DETECTED NOT DETECTED   Bacteroides fragilis NOT DETECTED NOT DETECTED   Enterobacterales DETECTED (A) NOT DETECTED   Enterobacter cloacae complex NOT DETECTED NOT DETECTED   Escherichia coli NOT DETECTED NOT DETECTED   Klebsiella aerogenes NOT DETECTED NOT DETECTED   Klebsiella oxytoca NOT DETECTED NOT DETECTED   Klebsiella pneumoniae DETECTED (A) NOT DETECTED   Proteus species NOT DETECTED NOT DETECTED   Salmonella species NOT DETECTED NOT DETECTED   Serratia marcescens NOT DETECTED NOT DETECTED    Haemophilus influenzae NOT DETECTED NOT DETECTED   Neisseria meningitidis NOT DETECTED NOT DETECTED   Pseudomonas aeruginosa NOT DETECTED NOT DETECTED   Stenotrophomonas maltophilia NOT DETECTED NOT DETECTED   Candida albicans NOT DETECTED NOT DETECTED   Candida auris NOT DETECTED NOT DETECTED   Candida glabrata NOT DETECTED NOT DETECTED   Candida krusei NOT DETECTED NOT DETECTED   Candida parapsilosis NOT DETECTED NOT DETECTED   Candida tropicalis NOT DETECTED NOT DETECTED   Cryptococcus neoformans/gattii NOT DETECTED NOT DETECTED   CTX-M ESBL DETECTED (A) NOT DETECTED   Carbapenem resistance IMP NOT DETECTED NOT DETECTED   Carbapenem resistance KPC NOT DETECTED NOT DETECTED   Carbapenem resistance NDM NOT DETECTED NOT DETECTED   Carbapenem resist OXA 48 LIKE NOT DETECTED NOT DETECTED   Carbapenem resistance VIM NOT DETECTED NOT DETECTED   Effie Shy, PharmD Pharmacy Resident  04/03/2023 8:50 PM

## 2023-04-03 NOTE — ED Notes (Signed)
Patient awake and speaking with RN and speech therapist. Patient took of ginger ale from RN.

## 2023-04-03 NOTE — ED Notes (Signed)
Called lab to add on Troponin. Lab confirmed add-on.

## 2023-04-03 NOTE — Consult Note (Signed)
Pharmacy Antibiotic Note  ASSESSMENT: 78 y.o. male with PMH including UTI (ESBL K. Pneumo), COPD (3L O2 baseline) is presenting with sepsis. O2Sats 100% on baseline O2 but he is hypotensive. Regarding ESBL concern, patient presented to ED with UTI in 12/2022 and discharged on cephalexin. Ucx later resulted with 90,000 CFU ESBL K. Pneumo (susceptible to imipenem)  Pharmacy has been consulted to manage meropenem and vancomycin dosing. He is in AKI so pharmacy will need to dose vancomycin by level for the time being.  Patient measurements: Height: 6\' 2"  (188 cm) Weight: 95.3 kg (210 lb 1.6 oz) IBW/kg (Calculated) : 82.2  Vital signs: Temp: 97.6 F (36.4 C) (02/11 0516) Temp Source: Oral (02/11 0516) BP: 94/57 (02/11 0700) Pulse Rate: 80 (02/11 0700) Recent Labs  Lab 04/03/23 0521  WBC 21.2*  CREATININE 2.54*   Estimated Creatinine Clearance: 27.9 mL/min (A) (by C-G formula based on SCr of 2.54 mg/dL (H)).  Allergies: No Known Allergies  Antimicrobials this admission: Cefepime, metronidazole 2/11 x 1 Meropenem 2/11 >> Vancomycin 2/11 >>  Dose adjustments this admission: N/A  Microbiology results: 2/11 BCx: sent 2/11 UCx: sent 2/11 MRSA PCR: ordered 2/11 Respiratory panel PCR: COVID(+), possibly residual  PLAN: Initiate meropenem 1 g IV q12H Administer vancomycin 2000 mg IV x 1 as a loading dose With current renal function, half-life of vancomycin is ~25 hours >> will check 24-hour vanc random level before re-dosing Follow up culture results to assess for antibiotic optimization. Monitor renal function to assess for any necessary antibiotic dosing changes.   Thank you for allowing pharmacy to be a part of this patient's care.  Will M. Dareen Piano, PharmD Clinical Pharmacist 04/03/2023 8:52 AM

## 2023-04-03 NOTE — ED Notes (Signed)
Pt fed apple sauce. No coughing or choking noted. Pt states there is a turtle in corner

## 2023-04-03 NOTE — ED Notes (Signed)
Patient returned from radiology

## 2023-04-03 NOTE — ED Notes (Signed)
Pt found eating ice cream with his hands, not sitting up right. Pt given new gown and cleaned up.

## 2023-04-04 ENCOUNTER — Inpatient Hospital Stay: Payer: Medicare Other

## 2023-04-04 ENCOUNTER — Inpatient Hospital Stay
Admit: 2023-04-04 | Discharge: 2023-04-04 | Disposition: A | Discharge status: Home / Self Care | Payer: Medicare Other | Attending: Internal Medicine

## 2023-04-04 ENCOUNTER — Inpatient Hospital Stay: Payer: Medicare Other | Attending: Oncology | Admitting: Oncology

## 2023-04-04 ENCOUNTER — Encounter: Payer: Self-pay | Admitting: Internal Medicine

## 2023-04-04 DIAGNOSIS — G9340 Encephalopathy, unspecified: Secondary | ICD-10-CM

## 2023-04-04 DIAGNOSIS — A4159 Other Gram-negative sepsis: Secondary | ICD-10-CM | POA: Diagnosis not present

## 2023-04-04 DIAGNOSIS — W19XXXA Unspecified fall, initial encounter: Secondary | ICD-10-CM

## 2023-04-04 DIAGNOSIS — Z1612 Extended spectrum beta lactamase (ESBL) resistance: Secondary | ICD-10-CM | POA: Diagnosis not present

## 2023-04-04 DIAGNOSIS — A4150 Gram-negative sepsis, unspecified: Secondary | ICD-10-CM

## 2023-04-04 DIAGNOSIS — D649 Anemia, unspecified: Secondary | ICD-10-CM | POA: Diagnosis not present

## 2023-04-04 DIAGNOSIS — B961 Klebsiella pneumoniae [K. pneumoniae] as the cause of diseases classified elsewhere: Secondary | ICD-10-CM

## 2023-04-04 DIAGNOSIS — N179 Acute kidney failure, unspecified: Secondary | ICD-10-CM | POA: Diagnosis not present

## 2023-04-04 LAB — URINALYSIS, W/ REFLEX TO CULTURE (INFECTION SUSPECTED)
Bacteria, UA: NONE SEEN
Bilirubin Urine: NEGATIVE
Glucose, UA: NEGATIVE mg/dL
Ketones, ur: NEGATIVE mg/dL
Nitrite: NEGATIVE
Protein, ur: 100 mg/dL — AB
RBC / HPF: >50 RBC/hpf (ref 0–5)
Specific Gravity, Urine: 1.012 (ref 1.005–1.030)
WBC, UA: >50 WBC/hpf (ref 0–5)
pH: 5 (ref 5.0–8.0)

## 2023-04-04 LAB — BPAM RBC
Blood Product Expiration Date: 202502132359
ISSUE DATE / TIME: 202502110932
Unit Type and Rh: 9500

## 2023-04-04 LAB — BASIC METABOLIC PANEL
Anion gap: 11 (ref 5–15)
BUN: 51 mg/dL — ABNORMAL HIGH (ref 8–23)
CO2: 27 mmol/L (ref 22–32)
Calcium: 7.6 mg/dL — ABNORMAL LOW (ref 8.9–10.3)
Chloride: 100 mmol/L (ref 98–111)
Creatinine, Ser: 2.8 mg/dL — ABNORMAL HIGH (ref 0.61–1.24)
GFR, Estimated: 22 mL/min — ABNORMAL LOW (ref >60)
Glucose, Bld: 118 mg/dL — ABNORMAL HIGH (ref 70–99)
Potassium: 3.3 mmol/L — ABNORMAL LOW (ref 3.5–5.1)
Sodium: 138 mmol/L (ref 135–145)

## 2023-04-04 LAB — CBC
HCT: 23.9 % — ABNORMAL LOW (ref 39.0–52.0)
Hemoglobin: 7 g/dL — ABNORMAL LOW (ref 13.0–17.0)
MCH: 28.1 pg (ref 26.0–34.0)
MCHC: 29.3 g/dL — ABNORMAL LOW (ref 30.0–36.0)
MCV: 96 fL (ref 80.0–100.0)
Platelets: 38 10*3/uL — ABNORMAL LOW (ref 150–400)
RBC: 2.49 MIL/uL — ABNORMAL LOW (ref 4.22–5.81)
RDW: 22.2 % — ABNORMAL HIGH (ref 11.5–15.5)
WBC: 17.4 10*3/uL — ABNORMAL HIGH (ref 4.0–10.5)
nRBC: 0.5 % — ABNORMAL HIGH (ref 0.0–0.2)

## 2023-04-04 LAB — TYPE AND SCREEN
ABO/RH(D): AB POS
Antibody Screen: NEGATIVE
Unit division: 0

## 2023-04-04 LAB — ECHOCARDIOGRAM COMPLETE
AR max vel: 2.95 cm2
AV Area VTI: 3 cm2
AV Area mean vel: 2.91 cm2
AV Mean grad: 3 mm[Hg]
AV Peak grad: 5.5 mm[Hg]
Ao pk vel: 1.17 m/s
Height: 74 in
S' Lateral: 3.5 cm
Weight: 3361.57 [oz_av]

## 2023-04-04 LAB — CBG MONITORING, ED
Glucose-Capillary: 106 mg/dL — ABNORMAL HIGH (ref 70–99)
Glucose-Capillary: 121 mg/dL — ABNORMAL HIGH (ref 70–99)

## 2023-04-04 LAB — GLUCOSE, CAPILLARY
Glucose-Capillary: 159 mg/dL — ABNORMAL HIGH (ref 70–99)
Glucose-Capillary: 137 mg/dL — ABNORMAL HIGH (ref 70–99)

## 2023-04-04 LAB — OCCULT BLOOD X 1 CARD TO LAB, STOOL: Fecal Occult Bld: NEGATIVE

## 2023-04-04 MED ORDER — METOPROLOL SUCCINATE ER 50 MG PO TB24: 50.0000 mg | ORAL_TABLET | Freq: Every day | ORAL | Status: DC

## 2023-04-04 MED ORDER — METOPROLOL TARTRATE 5 MG/5ML IV SOLN
5.0000 mg | INTRAVENOUS | Status: DC | PRN
  Administered 2023-04-04 – 2023-04-07 (×7): 5 mg via INTRAVENOUS
  Filled 2023-04-04 (×7): qty 5

## 2023-04-04 MED ORDER — METOPROLOL TARTRATE 25 MG PO TABS
25.0000 mg | ORAL_TABLET | Freq: Two times a day (BID) | ORAL | Status: DC
  Administered 2023-04-04 – 2023-04-06 (×5): 25 mg via ORAL
  Filled 2023-04-04 (×6): qty 1

## 2023-04-04 MED ORDER — BISACODYL 5 MG PO TBEC
10.0000 mg | DELAYED_RELEASE_TABLET | Freq: Every day | ORAL | Status: DC | PRN
  Administered 2023-04-04 – 2023-04-11 (×2): 10 mg via ORAL
  Filled 2023-04-04 (×2): qty 2

## 2023-04-04 MED ORDER — SENNOSIDES-DOCUSATE SODIUM 8.6-50 MG PO TABS
1.0000 | ORAL_TABLET | Freq: Two times a day (BID) | ORAL | Status: DC
  Administered 2023-04-04 – 2023-04-12 (×14): 1 via ORAL
  Filled 2023-04-04 (×16): qty 1

## 2023-04-04 MED ORDER — SODIUM CHLORIDE 0.9 % IV SOLN: INTRAVENOUS | Status: AC

## 2023-04-04 NOTE — Progress Notes (Signed)
   04/04/23 1256  Readmission Prevention Plan - Extreme Risk  Transportation Screening Complete  Medication Review Complete ([facility] in [place])  PCP or Specialist appointment within 3-5 days of discharge Complete Clinical research associate will schedule at time of d/c)  Social Work consult for recovery care planning/counseling (includes patient and caregiver) Complete  Palliative Care Screening Not Applicable  Skilled Nursing Facility Complete (Pt from [facility] for sTR)   CSW received a returned call from patient's spouse to complete HRRA.   Admitted ZOX:[id number]  Admitted from: [facility] [place], [facility] PCP: Masoud Pharmacy: [facility] in [place]  Current home health/prior home health/DME: Bedside commode, wheelchair, oxygen 3L  TOC will continue to follow and assist with discharge needs back to [facility].

## 2023-04-04 NOTE — ED Notes (Signed)
Pt had a full brief. X1 bowel movement, x1 urination. Linens changed, and new brief placed on patient. Pt stool appeared dark green.

## 2023-04-04 NOTE — TOC CM/SW Note (Signed)
CSW reviewed most recent notes and reached out to patient's spouse to attempt HRRA; CSW left a voicemail asking for a returned call.

## 2023-04-04 NOTE — Progress Notes (Signed)
[facility]  - [place] [facility]   PATIENT NAME: Robert [name]    MR#:  [id number]  DATE OF BIRTH:  03-10-1945  SUBJECTIVE:  no family at bedside. Patient is a poor historian. Came in from [facility] with fall, low blood pressure and altered mental status. Patient unable to give any history. Denies any abdominal pain nausea or vomiting.   VITALS:  Blood pressure 130/85, pulse (!) 120, temperature 97.9 F (36.6 C), temperature source Oral, resp. rate 20, height 6\' 2"  (1.88 m), weight 95.3 kg, SpO2 99%.  PHYSICAL EXAMINATION:   GENERAL:  78 y.o.-year-old patient with no acute distress. Morbidly obese, chronically ill appearing LUNGS: Normal breath sounds bilaterally, no wheezing CARDIOVASCULAR: S1, S2 normal. No murmur   ABDOMEN: Soft, nontender, nondistended.  EXTREMITIES: No  edema b/l.    NEUROLOGIC: nonfocal  patient is alert and awake   LABORATORY PANEL:  CBC Recent Labs  Lab 04/04/23 0459  WBC 17.4*  HGB 7.0*  HCT 23.9*  PLT 38*    Chemistries  Recent Labs  Lab 04/03/23 0521 04/04/23 0459  NA 139 138  K 3.5 3.3*  CL 97* 100  CO2 29 27  GLUCOSE 160* 118*  BUN 41* 51*  CREATININE 2.54* 2.80*  CALCIUM 8.4* 7.6*  AST 27  --   ALT 11  --   ALKPHOS 71  --   BILITOT 0.9  --    Cardiac Enzymes No results for input(s): "TROPONINI" in the last 168 hours. RADIOLOGY:  DG Chest 1 View Result Date: 04/04/2023 CLINICAL DATA:  78 year old male with history of congestive heart failure. EXAM: CHEST  1 VIEW COMPARISON:  Chest x-ray 04/03/2023. FINDINGS: Lung volumes are low. There is cephalization of the pulmonary vasculature and slight indistinctness of the interstitial markings suggestive of mild pulmonary edema. Small bilateral pleural effusions. Mild cardiomegaly. The patient is rotated to the left on today's exam, resulting in distortion of the mediastinal contours and reduced diagnostic sensitivity and specificity for mediastinal  pathology. Atherosclerotic calcifications are noted in the thoracic aorta. IMPRESSION: 1. The appearance the chest is most suggestive of congestive heart failure, as above. 2. Aortic atherosclerosis. Electronically Signed   By: Trudie Reed M.D.   On: 04/04/2023 07:02   DG Foot 2 Views Right Result Date: 04/03/2023 CLINICAL DATA:  Osteomyelitis.  Fall. EXAM: RIGHT FOOT - 2 VIEW COMPARISON:  None Available. FINDINGS: Large plantar calcaneal spur. Atherosclerotic vascular calcifications. Degenerative spurring in the midfoot. The MTP joints are hyper extended with flexion of the distal interphalangeal joints suggesting second through fifth hammertoe deformities. As result of this, the phalanges are poorly characterized on the AP projection, with the x-ray projection oriented along or oblique to the long axis of the proximal phalanges. Interphalangeal osteoarthritis with loss of articular space and spurring. No malalignment or well-defined fracture. Type 1 accessory navicular. Spurring of the first digit sesamoids. Mild corticated lateral erosion of the head of the fifth metatarsal. IMPRESSION: 1. No malalignment or well-defined fracture. 2. Large plantar calcaneal spur. 3. Atherosclerotic vascular calcifications. 4. Hammertoe deformities. 5. Osteoarthritis. 6. Mild corticated lateral erosion of the head of the fifth metatarsal. This is nonspecific but could be seen with gout or rheumatoid arthropathy. Electronically Signed   By: Gaylyn Rong M.D.   On: 04/03/2023 10:33   DG Chest Port 1 View Result Date: 04/03/2023 CLINICAL DATA:  78 year old male with history of altered mental status and fall. EXAM: PORTABLE CHEST 1 VIEW COMPARISON:  Chest x-ray 03/17/2023.  FINDINGS: Opacity at the right base which may reflect atelectasis and/or consolidation, with superimposed small right pleural effusion. Left lung is clear. No definite left pleural effusion. No pneumothorax. No evidence of pulmonary edema. Heart  size appears borderline enlarged. The patient is rotated to the right on today's exam, resulting in distortion of the mediastinal contours and reduced diagnostic sensitivity and specificity for mediastinal pathology. Atherosclerotic calcifications are noted in the thoracic aorta. IMPRESSION: 1. Atelectasis and/or consolidation in the right lower lobe with small right pleural effusion. 2. Aortic atherosclerosis. Electronically Signed   By: Trudie Reed M.D.   On: 04/03/2023 06:43   CT Head Wo Contrast Result Date: 04/03/2023 CLINICAL DATA:  78 year old male with history of trauma from a fall. Altered mental status. EXAM: CT HEAD WITHOUT CONTRAST TECHNIQUE: Contiguous axial images were obtained from the base of the skull through the vertex without intravenous contrast. RADIATION DOSE REDUCTION: This exam was performed according to the departmental dose-optimization program which includes automated exposure control, adjustment of the mA and/or kV according to patient size and/or use of iterative reconstruction technique. COMPARISON:  Head CT 03/17/2023. FINDINGS: Brain: Mild cerebral atrophy. Patchy and confluent areas of decreased attenuation are noted throughout the deep and periventricular white matter of the cerebral hemispheres bilaterally, compatible with chronic microvascular ischemic disease. No evidence of acute infarction, hemorrhage, hydrocephalus, extra-axial collection or mass lesion/mass effect. Vascular: No hyperdense vessel. Atherosclerotic calcifications are again noted in the internal carotid arteries bilaterally and in the vertebral arteries bilaterally. Skull: Normal. Negative for fracture or focal lesion. Sinuses/Orbits: Multifocal mucosal thickening throughout the paranasal sinuses bilaterally, slightly improved compared to the prior examination with resolution of previously noted air-fluid levels. Other: Large left mastoid effusion, unchanged. IMPRESSION: 1. No evidence of significant  acute traumatic injury to the skull or brain. 2. Mild cerebral atrophy with chronic microvascular ischemic changes in the cerebral white matter, as above. 3. Chronic paranasal sinus disease, slightly improved compared to the prior study. 4. Large left mastoid effusion, unchanged. Electronically Signed   By: Trudie Reed M.D.   On: 04/03/2023 06:41    Assessment and Plan Robert [name] is a 78 y.o. male with medical history significant of recently diagnosed sigmoid colon CA status post sigmoid colon partial colectomy November 2024, chronic anemia secondary to CKD, CKD stage IIIA, ESBL UTI November 2024, chronic HFrEF with LVEF 40%, pericardial effusion status post pericardial window, sent from nursing home for recomment support evaluation of a fall, hypotension and altered mentations.   Severe sepsis with AK I and encephalopathy leukocytosis -- patient came in with leukocytosis hypotension elevated lactic acid -- source appears to be urine -- no evidence of pneumonia on chest x-ray -- blood culture positive for ESBL Klebsiella -- ID consultation with Dr. Rivka Safer -- patient currently on Meropenem  AK I on CKD stage IIIB -- patient's baseline creatinine around 1.8 -- came in with creatinine of 2.5-- 2.8 -- consider nephrology consultation, monitor input output, avoid nephrotoxic agents  Anemia of chronic disease chronic thrombocytopenia -- no evidence of active bleeding -- patient was supposed to follow-up with hematology as outpatient (from previous discharge summary) -- received one unit blood transfusion -- 6.7-- 7.8-- 6.4-- one unit blood transfusion-- 7.0 -- avoid antiplatelet agents   Acute metabolic encephalopathy -- patient's mentation much improved however has a poor historian  Elevated troponin appears demand ischemia from sepsis/hypotension -- troponin's flat no acute EKG changes  Hx of cardiac effusion status post pericardial window in July 2024 -X-ray  showed  no cardiomegaly -- echo pending  Chronic congestive heart failure systolic -- appears stable -- echo in October 2024 at [facility] showed LV EF of 40% -- hold Lasix since blood pressure low and patient presented with sepsis -- patient receiving IV fluids Monitor volume status    Procedures: Family communication : none today Consults : ID CODE STATUS: DNR DVT Prophylaxis :SCD--(chronic TCP) Level of care: Progressive Status is: Inpatient Remains inpatient appropriate because: sepsis    TOTAL TIME TAKING CARE OF THIS PATIENT: 45 minutes.  >50% time spent on counselling and coordination of care  Note: This dictation was prepared with Dragon dictation along with smaller phrase technology. Any transcriptional errors that result from this process are unintentional.  Enedina Finner M.D    [facility]   CC: Primary care physician; Corky Downs, MD

## 2023-04-04 NOTE — Progress Notes (Signed)
*  PRELIMINARY RESULTS* Echocardiogram 2D Echocardiogram has been performed.  Robert [name] 04/04/2023, 8:28 AM

## 2023-04-04 NOTE — ED Notes (Addendum)
Hospitalist made aware of pt continued tachycardia and soft blood pressure, states she will come see patient soon
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
## 2023-04-04 NOTE — ED Notes (Signed)
Ellis Parents, RN fed patient 50% of lunch

## 2023-04-05 DIAGNOSIS — Z7189 Other specified counseling: Secondary | ICD-10-CM

## 2023-04-05 DIAGNOSIS — I5022 Chronic systolic (congestive) heart failure: Secondary | ICD-10-CM

## 2023-04-05 DIAGNOSIS — A498 Other bacterial infections of unspecified site: Secondary | ICD-10-CM

## 2023-04-05 DIAGNOSIS — Z1612 Extended spectrum beta lactamase (ESBL) resistance: Secondary | ICD-10-CM | POA: Diagnosis not present

## 2023-04-05 DIAGNOSIS — A4159 Other Gram-negative sepsis: Secondary | ICD-10-CM | POA: Diagnosis not present

## 2023-04-05 DIAGNOSIS — B961 Klebsiella pneumoniae [K. pneumoniae] as the cause of diseases classified elsewhere: Secondary | ICD-10-CM | POA: Diagnosis not present

## 2023-04-05 DIAGNOSIS — G9341 Metabolic encephalopathy: Secondary | ICD-10-CM

## 2023-04-05 DIAGNOSIS — R4182 Altered mental status, unspecified: Secondary | ICD-10-CM | POA: Diagnosis not present

## 2023-04-05 DIAGNOSIS — W19XXXA Unspecified fall, initial encounter: Secondary | ICD-10-CM | POA: Diagnosis not present

## 2023-04-05 DIAGNOSIS — R7881 Bacteremia: Secondary | ICD-10-CM

## 2023-04-05 DIAGNOSIS — A419 Sepsis, unspecified organism: Secondary | ICD-10-CM

## 2023-04-05 DIAGNOSIS — D696 Thrombocytopenia, unspecified: Secondary | ICD-10-CM | POA: Diagnosis not present

## 2023-04-05 DIAGNOSIS — R6520 Severe sepsis without septic shock: Secondary | ICD-10-CM

## 2023-04-05 LAB — CBC
HCT: 25.8 % — ABNORMAL LOW (ref 39.0–52.0)
Hemoglobin: 7.6 g/dL — ABNORMAL LOW (ref 13.0–17.0)
MCH: 28.8 pg (ref 26.0–34.0)
MCHC: 29.5 g/dL — ABNORMAL LOW (ref 30.0–36.0)
MCV: 97.7 fL (ref 80.0–100.0)
Platelets: 20 10*3/uL — CL (ref 150–400)
RBC: 2.64 MIL/uL — ABNORMAL LOW (ref 4.22–5.81)
RDW: 21.5 % — ABNORMAL HIGH (ref 11.5–15.5)
WBC: 12.3 10*3/uL — ABNORMAL HIGH (ref 4.0–10.5)
nRBC: 0.5 % — ABNORMAL HIGH (ref 0.0–0.2)

## 2023-04-05 LAB — GLUCOSE, CAPILLARY
Glucose-Capillary: 110 mg/dL — ABNORMAL HIGH (ref 70–99)
Glucose-Capillary: 114 mg/dL — ABNORMAL HIGH (ref 70–99)
Glucose-Capillary: 129 mg/dL — ABNORMAL HIGH (ref 70–99)
Glucose-Capillary: 130 mg/dL — ABNORMAL HIGH (ref 70–99)

## 2023-04-05 LAB — URINE CULTURE

## 2023-04-05 LAB — CREATININE, SERUM
Creatinine, Ser: 2.63 mg/dL — ABNORMAL HIGH (ref 0.61–1.24)
GFR, Estimated: 24 mL/min — ABNORMAL LOW (ref >60)

## 2023-04-05 NOTE — Progress Notes (Signed)
[facility]  - [place] [facility]   PATIENT NAME: Robert [name]    MR#:  [id number]  DATE OF BIRTH:  23-Dec-1945  SUBJECTIVE:  Patient was feeling improved today.  Had a BM, started getting some appetite back.  VITALS:  Blood pressure 120/81, pulse (!) 122, temperature 97.6 F (36.4 C), resp. rate 16, height 6\' 2"  (1.88 m), weight 95.3 kg, SpO2 100%.  PHYSICAL EXAMINATION:   General.  Frail elderly man, in no acute distress. Pulmonary.  Lungs clear bilaterally, normal respiratory effort. CV.  Regular rate and rhythm, no JVD, rub or murmur. Abdomen.  Soft, nontender, nondistended, BS positive. CNS.  Alert and oriented .  No focal neurologic deficit. Extremities.  No edema, no cyanosis, pulses intact and symmetrical.   LABORATORY PANEL:  CBC Recent Labs  Lab 04/05/23 1218  WBC 12.3*  HGB 7.6*  HCT 25.8*  PLT 20*    Chemistries  Recent Labs  Lab 04/03/23 0521 04/04/23 0459 04/05/23 0605  NA 139 138  --   K 3.5 3.3*  --   CL 97* 100  --   CO2 29 27  --   GLUCOSE 160* 118*  --   BUN 41* 51*  --   CREATININE 2.54* 2.80* 2.63*  CALCIUM 8.4* 7.6*  --   AST 27  --   --   ALT 11  --   --   ALKPHOS 71  --   --   BILITOT 0.9  --   --    Cardiac Enzymes No results for input(s): "TROPONINI" in the last 168 hours. RADIOLOGY:  ECHOCARDIOGRAM COMPLETE Result Date: 04/04/2023    ECHOCARDIOGRAM REPORT   Patient Name:   Robert [name] Date of Exam: 04/04/2023 Medical Rec #:  [id number]          Height:       74.0 in Accession #:    [id number]         Weight:       210.1 lb Date of Birth:  June 10, 1945         BSA:          2.219 m Patient Age:    78 years           BP:           107/71 mmHg Patient Gender: M                  HR:           130 bpm. Exam Location:  [facility] Procedure: 2D Echo, Cardiac Doppler and Color Doppler (Both Spectral and Color            Flow Doppler were utilized during procedure). Indications:     CHF I50.21  History:         Patient  has prior history of Echocardiogram examinations, most                  recent 12/22/2022. HFpEF and CHF, Arrythmias:Tachycardia; Risk                  Factors:Non-Smoker, Hypertension, Diabetes and Dyslipidemia.  Sonographer:     Dondra Prader RVT RCS Referring Phys:  [id number] Emeline General Diagnosing Phys: Alwyn Pea MD  Sonographer Comments: Elevated HR 130's bpm; Limited and poor windows therefore unable to use definity. IMPRESSIONS  1. Left ventricular ejection fraction, by estimation, is 45 to 50%. The left ventricle has mildly decreased function. The left  ventricle has no regional wall motion abnormalities. Left ventricular diastolic parameters were normal.  2. Right ventricular systolic function is normal. The right ventricular size is normal.  3. The mitral valve is normal in structure. No evidence of mitral valve regurgitation.  4. The aortic valve is normal in structure. Aortic valve regurgitation is not visualized. FINDINGS  Left Ventricle: Left ventricular ejection fraction, by estimation, is 45 to 50%. The left ventricle has mildly decreased function. The left ventricle has no regional wall motion abnormalities. Strain imaging was not performed. The left ventricular internal cavity size was normal in size. There is no left ventricular hypertrophy. Left ventricular diastolic parameters were normal. Right Ventricle: The right ventricular size is normal. No increase in right ventricular wall thickness. Right ventricular systolic function is normal. Left Atrium: Left atrial size was normal in size. Right Atrium: Right atrial size was normal in size. Pericardium: There is no evidence of pericardial effusion. Mitral Valve: The mitral valve is normal in structure. No evidence of mitral valve regurgitation. Tricuspid Valve: The tricuspid valve is normal in structure. Tricuspid valve regurgitation is trivial. Aortic Valve: The aortic valve is normal in structure. Aortic valve regurgitation is not visualized.  Aortic valve mean gradient measures 3.0 mmHg. Aortic valve peak gradient measures 5.5 mmHg. Aortic valve area, by VTI measures 3.00 cm. Pulmonic Valve: The pulmonic valve was not well visualized. Pulmonic valve regurgitation is not visualized. Aorta: The ascending aorta was not well visualized. IAS/Shunts: No atrial level shunt detected by color flow Doppler. Additional Comments: 3D imaging was not performed.  LEFT VENTRICLE PLAX 2D LVIDd:         4.60 cm   Diastology LVIDs:         3.50 cm   LV e' medial: 12.30 cm/s LV PW:         1.10 cm LV IVS:        0.90 cm LVOT diam:     2.10 cm LV SV:         43 LV SV Index:   19 LVOT Area:     3.46 cm  RIGHT VENTRICLE             IVC RV S prime:     13.40 cm/s  IVC diam: 1.60 cm TAPSE (M-mode): 1.4 cm LEFT ATRIUM         Index LA diam:    3.20 cm 1.44 cm/m  AORTIC VALVE                    PULMONIC VALVE AV Area (Vmax):    2.95 cm     PV Vmax:       0.82 m/s AV Area (Vmean):   2.91 cm     PV Peak grad:  2.7 mmHg AV Area (VTI):     3.00 cm AV Vmax:           117.00 cm/s AV Vmean:          78.400 cm/s AV VTI:            0.142 m AV Peak Grad:      5.5 mmHg AV Mean Grad:      3.0 mmHg LVOT Vmax:         99.80 cm/s LVOT Vmean:        65.900 cm/s LVOT VTI:          0.123 m LVOT/AV VTI ratio: 0.87  AORTA Ao Root diam: 4.00 cm  SHUNTS Systemic VTI:  0.12 m Systemic Diam: 2.10 cm Alwyn Pea MD Electronically signed by Alwyn Pea MD Signature Date/Time: 04/04/2023/8:30:26 PM    Final    DG Chest 1 View Result Date: 04/04/2023 CLINICAL DATA:  78 year old male with history of congestive heart failure. EXAM: CHEST  1 VIEW COMPARISON:  Chest x-ray 04/03/2023. FINDINGS: Lung volumes are low. There is cephalization of the pulmonary vasculature and slight indistinctness of the interstitial markings suggestive of mild pulmonary edema. Small bilateral pleural effusions. Mild cardiomegaly. The patient is rotated to the left on today's exam, resulting in distortion of the  mediastinal contours and reduced diagnostic sensitivity and specificity for mediastinal pathology. Atherosclerotic calcifications are noted in the thoracic aorta. IMPRESSION: 1. The appearance the chest is most suggestive of congestive heart failure, as above. 2. Aortic atherosclerosis. Electronically Signed   By: Trudie Reed M.D.   On: 04/04/2023 07:02    Assessment and Plan NADEEM ROMANOSKI is a 78 y.o. male with medical history significant of recently diagnosed sigmoid colon CA status post sigmoid colon partial colectomy November 2024, chronic anemia secondary to CKD, CKD stage IIIA, ESBL UTI November 2024, chronic HFrEF with LVEF 40%, pericardial effusion status post pericardial window, sent from nursing home for recomment support evaluation of a fall, hypotension and altered mentations.   Severe sepsis with AK I and encephalopathy Leukocytosis ESBL Klebsiella pneumonia bacteremia -- patient came in with leukocytosis hypotension elevated lactic acid -- source appears to be urine -- no evidence of pneumonia on chest x-ray -- blood culture positive for ESBL Klebsiella -- ID is on board. --Repeat blood cultures ordered today -- patient currently on Meropenem  AK I on CKD stage IIIB -- patient's baseline creatinine around 1.8 -- came in with creatinine of 2.5-- 2.8, seems slowly improving -- consider nephrology consultation, monitor input output, avoid nephrotoxic agents  Anemia of chronic disease chronic thrombocytopenia Platelets continue to get worsen, came back at 20 today -- no evidence of active bleeding -- patient was supposed to follow-up with hematology as outpatient (from previous discharge summary) -- received one unit blood transfusion -- 6.7-- 7.8-- 6.4-- one unit blood transfusion-- 7.0>>7.6 --Hematology was consulted -- avoid antiplatelet agents   Acute metabolic encephalopathy -- patient's mentation much improved however has a poor historian, currently appears  to be at baseline  Elevated troponin appears demand ischemia from sepsis/hypotension -- troponin's flat no acute EKG changes  Hx of cardiac effusion status post pericardial window in July 2024 -X-ray showed no cardiomegaly -- echo with EF of 45 to 50%, no regional wall motion abnormalities or other significant abnormality.  Similar to the prior echo.  Chronic congestive heart failure systolic -- appears stable -- echo in October 2024 at [facility] showed LV EF of 40%, repeat echo with similar EF. -- hold Lasix since blood pressure low and patient presented with sepsis -- patient receiving IV fluids Monitor volume status  Procedures: Family communication : Unable to reach wife on phone Consults : ID CODE STATUS: DNR DVT Prophylaxis :SCD--(chronic TCP) Level of care: Progressive Status is: Inpatient Remains inpatient appropriate because: sepsis    TOTAL TIME TAKING CARE OF THIS PATIENT: 50 minutes.  >50% time spent on counselling and coordination of care  Note: This dictation was prepared with Dragon dictation along with smaller phrase technology. Any transcriptional errors that result from this process are unintentional.  Arnetha Courser M.D    [facility]   CC: Primary care physician; Corky Downs, MD
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
## 2023-04-05 NOTE — Progress Notes (Signed)
Speech Language Pathology Treatment: Dysphagia  Patient Details Name: NYSHAWN GOWDY MRN: [id number] DOB: 07/08/1945 Today's Date: 04/05/2023 Time: 1000-1025 SLP Time Calculation (min) (ACUTE ONLY): 25 min  Assessment / Plan / Recommendation Clinical Impression  Pt seen for follow up dysphagia intervention. Pt awake and agreeable to session- with increase in engagement/energy for task of eating compared to initial evaluation. Pt/RN reporting reduced intake/appetite. Pt seen with trials of regular solids and thin liquids (via cup). No overt or subtle s/sx pharyngeal dysphagia noted. No change to vocal quality across trials. Pt demonstrated complete oral manipulation and clearance for duration of trials. Pt able to self feed (manipulating cracker and bringing cup to lips) independently. Independent maintenance of slow rate and small bites noted. Recommend advancement to regular solids (cut meats) and thin liquids with continued aspiration precautions (slow rate, small bites, elevated HOB, and alert for PO intake). Pt endorsed understanding education. Precautions remain posted at the bedside. Intermittent supervision for compliance with aspiration precautions/assist with feeding as necessary. No further acute SLP services indicated at this time.    HPI HPI: Pt is a 78 y.o. male with medical history significant of recent COVID positive infection 02/2023 and recently diagnosed sigmoid colon CA status post sigmoid colon partial colectomy November 2024, PVD, DM, HTN, Cellulitis, chronic anemia secondary to CKD, CKD stage IIIA, ESBL UTI November 2024, chronic HFrEF with LVEF 40%, pericardial effusion status post pericardial window, sent from nursing home for recomment support evaluation of a fall, hypotension and altered mentations.     Patient complained about having been feeling tired low energy for last week.  Last night, nursing home staff found the patient was on the floor awake but confused with "strong  urine smell".  Patient does not remember what has happened but reports that has been feeling weak.  Denied any abdominal pain no nauseous vomiting, denied any dark-colored stool or blood in the stool.   CXR 2/12: The appearance the chest is most suggestive of congestive heart  failure, as above.  2. Aortic atherosclerosis.      SLP Plan  All goals met      Recommendations for follow up therapy are one component of a multi-disciplinary discharge planning process, led by the attending physician.  Recommendations may be updated based on patient status, additional functional criteria and insurance authorization.    Recommendations  Diet recommendations: Regular;Thin liquid Liquids provided via: Cup (pt reporting preference for cup vs straw.) Medication Administration: Crushed with puree Supervision: Patient able to self feed;Intermittent supervision to cue for compensatory strategies Compensations: Minimize environmental distractions;Slow rate;Small sips/bites Postural Changes and/or Swallow Maneuvers: Seated upright 90 degrees                  Oral care BID;Oral care before and after PO;Staff/trained caregiver to provide oral care     Dysphagia, oral phase (R13.11)     All goals met    [place] Arbell Wycoff Clapp, MS, CCC-SLP Speech Language Pathologist Rehab Services; [facility] [phone] (ascom)   [place] J Clapp  04/05/2023, 12:30 PM
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
## 2023-04-05 NOTE — Care Management Important Message (Signed)
Important Message  Patient Details  Name: Robert [name] MRN: [id number] Date of Birth: 02-11-1946   Important Message Given:  Yes - Medicare IM     Sherilyn Banker 04/05/2023, 11:43 AM

## 2023-04-05 NOTE — Plan of Care (Signed)

## 2023-04-05 NOTE — Plan of Care (Signed)
Problem: Coping: Goal: Ability to adjust to condition or change in health will improve Outcome: Progressing   Problem: Fluid Volume: Goal: Ability to maintain a balanced intake and output will improve Outcome: Progressing   Problem: Skin Integrity: Goal: Risk for impaired skin integrity will decrease Outcome: Progressing   Problem: Tissue Perfusion: Goal: Adequacy of tissue perfusion will improve Outcome: Progressing

## 2023-04-05 NOTE — Progress Notes (Signed)
Critical lab of platelet count of 20, MD aware.

## 2023-04-06 ENCOUNTER — Inpatient Hospital Stay: Payer: Medicare Other

## 2023-04-06 DIAGNOSIS — N179 Acute kidney failure, unspecified: Secondary | ICD-10-CM | POA: Diagnosis not present

## 2023-04-06 DIAGNOSIS — G9341 Metabolic encephalopathy: Secondary | ICD-10-CM

## 2023-04-06 DIAGNOSIS — D638 Anemia in other chronic diseases classified elsewhere: Secondary | ICD-10-CM

## 2023-04-06 DIAGNOSIS — R531 Weakness: Secondary | ICD-10-CM

## 2023-04-06 DIAGNOSIS — I5023 Acute on chronic systolic (congestive) heart failure: Secondary | ICD-10-CM | POA: Insufficient documentation

## 2023-04-06 DIAGNOSIS — E278 Other specified disorders of adrenal gland: Secondary | ICD-10-CM

## 2023-04-06 DIAGNOSIS — A498 Other bacterial infections of unspecified site: Secondary | ICD-10-CM

## 2023-04-06 DIAGNOSIS — A419 Sepsis, unspecified organism: Secondary | ICD-10-CM | POA: Diagnosis not present

## 2023-04-06 DIAGNOSIS — N189 Chronic kidney disease, unspecified: Secondary | ICD-10-CM

## 2023-04-06 DIAGNOSIS — I5022 Chronic systolic (congestive) heart failure: Secondary | ICD-10-CM | POA: Insufficient documentation

## 2023-04-06 DIAGNOSIS — I5A Non-ischemic myocardial injury (non-traumatic): Secondary | ICD-10-CM | POA: Insufficient documentation

## 2023-04-06 DIAGNOSIS — R6520 Severe sepsis without septic shock: Secondary | ICD-10-CM | POA: Diagnosis not present

## 2023-04-06 DIAGNOSIS — N281 Cyst of kidney, acquired: Secondary | ICD-10-CM

## 2023-04-06 LAB — CULTURE, BLOOD (ROUTINE X 2): Special Requests: ADEQUATE

## 2023-04-06 LAB — CBC
HCT: 25.7 % — ABNORMAL LOW (ref 39.0–52.0)
Hemoglobin: 7.6 g/dL — ABNORMAL LOW (ref 13.0–17.0)
MCH: 28.4 pg (ref 26.0–34.0)
MCHC: 29.6 g/dL — ABNORMAL LOW (ref 30.0–36.0)
MCV: 95.9 fL (ref 80.0–100.0)
Platelets: 32 10*3/uL — ABNORMAL LOW (ref 150–400)
RBC: 2.68 MIL/uL — ABNORMAL LOW (ref 4.22–5.81)
RDW: 20.4 % — ABNORMAL HIGH (ref 11.5–15.5)
WBC: 10.8 10*3/uL — ABNORMAL HIGH (ref 4.0–10.5)
nRBC: 0.6 % — ABNORMAL HIGH (ref 0.0–0.2)

## 2023-04-06 LAB — RENAL FUNCTION PANEL
Albumin: <1.5 g/dL — ABNORMAL LOW (ref 3.5–5.0)
Anion gap: 8 (ref 5–15)
BUN: 46 mg/dL — ABNORMAL HIGH (ref 8–23)
CO2: 27 mmol/L (ref 22–32)
Calcium: 7.6 mg/dL — ABNORMAL LOW (ref 8.9–10.3)
Chloride: 104 mmol/L (ref 98–111)
Creatinine, Ser: 2.39 mg/dL — ABNORMAL HIGH (ref 0.61–1.24)
GFR, Estimated: 27 mL/min — ABNORMAL LOW (ref >60)
Glucose, Bld: 98 mg/dL (ref 70–99)
Phosphorus: 2.9 mg/dL (ref 2.5–4.6)
Potassium: 3.2 mmol/L — ABNORMAL LOW (ref 3.5–5.1)
Sodium: 139 mmol/L (ref 135–145)

## 2023-04-06 LAB — GLUCOSE, CAPILLARY
Glucose-Capillary: 107 mg/dL — ABNORMAL HIGH (ref 70–99)
Glucose-Capillary: 147 mg/dL — ABNORMAL HIGH (ref 70–99)
Glucose-Capillary: 87 mg/dL (ref 70–99)
Glucose-Capillary: 109 mg/dL — ABNORMAL HIGH (ref 70–99)

## 2023-04-06 MED ORDER — PANTOPRAZOLE SODIUM 40 MG PO TBEC
40.0000 mg | DELAYED_RELEASE_TABLET | Freq: Two times a day (BID) | ORAL | Status: DC
  Administered 2023-04-06 – 2023-04-12 (×14): 40 mg via ORAL
  Filled 2023-04-06 (×14): qty 1

## 2023-04-06 MED ORDER — POTASSIUM CHLORIDE CRYS ER 20 MEQ PO TBCR
20.0000 meq | EXTENDED_RELEASE_TABLET | Freq: Once | ORAL | Status: AC
  Administered 2023-04-06: 20 meq via ORAL
  Filled 2023-04-06: qty 1

## 2023-04-06 MED ORDER — METOPROLOL TARTRATE 25 MG PO TABS
37.5000 mg | ORAL_TABLET | Freq: Two times a day (BID) | ORAL | Status: DC
  Administered 2023-04-06 – 2023-04-07 (×2): 37.5 mg via ORAL
  Filled 2023-04-06 (×3): qty 2

## 2023-04-06 NOTE — Plan of Care (Signed)
  Problem: Metabolic: Goal: Ability to maintain appropriate glucose levels will improve Outcome: Progressing   Problem: Education: Goal: Knowledge of General Education information will improve Description: Including pain rating scale, medication(s)/side effects and non-pharmacologic comfort measures Outcome: Progressing   Problem: Pain Managment: Goal: General experience of comfort will improve and/or be controlled Outcome: Progressing   Problem: Skin Integrity: Goal: Risk for impaired skin integrity will decrease Outcome: Not Progressing   Problem: Clinical Measurements: Goal: Ability to maintain clinical measurements within normal limits will improve Outcome: Not Progressing Goal: Will remain free from infection Outcome: Not Progressing Goal: Respiratory complications will improve Outcome: Not Progressing Goal: Cardiovascular complication will be avoided Outcome: Not Progressing   Problem: Nutrition: Goal: Adequate nutrition will be maintained Outcome: Not Progressing

## 2023-04-06 NOTE — Evaluation (Signed)
Physical Therapy Evaluation Patient Details Name: CECILE GUEVARA MRN: [id number] DOB: October 29, 1945 Today's Date: 04/06/2023  History of Present Illness  presented to ER secondary to fall, hypotension and AMS; admitted for management of severe sepsis with acute encephalopathy secondary to ESBL klebsiella bacteremia.  Clinical Impression  Patient resting in bed upon arrival to session; alert and oriented, follows commands and agreeable to participation with session.  Denies pain. Generally weak and deconditioned throughout all extremities, grossly 3-/5 throughout.  Currently requiring max assist +2 for scooting up in bed and repositioning; did transition to chair position in bed with good tolerance.  Additional mobility deferred this AM due to tachycardia at rest (130s); RN informed/aware and in room for assessment.  Will continue to assess/progress in subsequent sessions as appropriate. Would benefit from skilled PT to address above deficits and promote optimal return to PLOF.;recommend post-acute PT follow up as indicated by interdisciplinary care team.            If plan is discharge home, recommend the following: Two people to help with walking and/or transfers;Two people to help with bathing/dressing/bathroom   Can travel by private vehicle   No    Equipment Recommendations    Recommendations for Other Services       Functional Status Assessment Patient has had a recent decline in their functional status and demonstrates the ability to make significant improvements in function in a reasonable and predictable amount of time.     Precautions / Restrictions Precautions Precautions: Fall Restrictions Weight Bearing Restrictions Per Provider Order: No      Mobility  Bed Mobility Overal bed mobility: Needs Assistance             General bed mobility comments: repositioning, scooting up in bed, max assist +2    Transfers                   General transfer  comment: deferred due to HR    Ambulation/Gait               General Gait Details: deferred due to HR  Stairs            Wheelchair Mobility     Tilt Bed    Modified Rankin (Stroke Patients Only)       Balance                                             Pertinent Vitals/Pain Pain Assessment Pain Assessment: No/denies pain    Home Living Family/patient expects to be discharged to:: Skilled nursing facility                   Additional Comments: At baseline, lives at home with wife in single-story home with ramp access; most recently at [facility] for rehab post-recent hospitalizations    Prior Function               Mobility Comments: At baseline, mod indep with [facility] for mobilization.  Most recently, walking up to 29' with RW at [facility] follow for safety)       Extremity/Trunk Assessment   Upper Extremity Assessment Upper Extremity Assessment: Generalized weakness (grossly 3-/5 throughout)    Lower Extremity Assessment Lower Extremity Assessment: Generalized weakness (grossly 3-/5 throughout)       Communication        Cognition Arousal: Alert Behavior  During Therapy: WFL for tasks assessed/performed   PT - Cognitive impairments: No apparent impairments                                 Cueing       General Comments      Exercises Other Exercises Other Exercises: Transitioned to chair position in bed for progressive tolerance to upright; assist for positioning in midline.  Tolerating well, needs in reach. Other Exercises: In supported sitting position, participated with oral care, light grooming (wash face, chest) and gown change, set sup/supervision.   Assessment/Plan    PT Assessment Patient needs continued PT services  PT Problem List Decreased strength;Decreased range of motion;Decreased activity tolerance;Decreased balance;Decreased mobility;Decreased coordination;Decreased cognition;Decreased  knowledge of use of DME;Decreased safety awareness;Cardiopulmonary status limiting activity;Impaired sensation;Decreased knowledge of precautions       PT Treatment Interventions DME instruction;Gait training;Functional mobility training;Therapeutic activities;Therapeutic exercise;Balance training;Cognitive remediation;Patient/family education    PT Goals (Current goals can be found in the Care Plan section)  Acute Rehab PT Goals Patient Stated Goal: to get back to rehab and be able to walk again PT Goal Formulation: With patient Time For Goal Achievement: 04/20/23 Potential to Achieve Goals: Fair    Frequency Min 1X/week     Co-evaluation               AM-PAC PT "6 Clicks" Mobility  Outcome Measure Help needed turning from your back to your side while in a flat bed without using bedrails?: Total Help needed moving from lying on your back to sitting on the side of a flat bed without using bedrails?: Total Help needed moving to and from a bed to a chair (including a wheelchair)?: Total Help needed standing up from a chair using your arms (e.g., wheelchair or bedside chair)?: Total Help needed to walk in hospital room?: Total Help needed climbing 3-5 steps with a railing? : Total 6 Click Score: 6    End of Session   Activity Tolerance: Treatment limited secondary to medical complications (Comment) (limited by HR) Patient left: in bed;with call bell/phone within reach;with bed alarm set Nurse Communication: Mobility status PT Visit Diagnosis: Muscle weakness (generalized) (M62.81);Difficulty in walking, not elsewhere classified (R26.2)    Time: 0981-1914 PT Time Calculation (min) (ACUTE ONLY): 25 min   Charges:   PT Evaluation $PT Eval High Complexity: 1 High PT Treatments $Therapeutic Activity: 8-22 mins PT General Charges $$ ACUTE PT VISIT: 1 Visit         Sible Straley H. Manson Passey, PT, DPT, NCS 04/06/23, 1:29 PM [phone]

## 2023-04-06 NOTE — Progress Notes (Signed)
Progress Note   Patient: Robert [name] ZOX:[id number] DOB: 1945/05/07 DOA: 04/03/2023     3 DOS: the patient was seen and examined on 04/06/2023   Brief hospital course: 78 year old man with recently diagnosed sigmoid colon cancer status post sigmoid colon partial colectomy in November 2024, chronic anemia secondary to CKD, CKD stage IIIa, ESBL UTI in November 2024, chronic heart failure with reduced ejection fraction 40%, pericardial effusion status post pericardial window.  Sent in from nursing home for fall hypotension and altered mental status.  Klebsiella pneumoniae growing out of blood cultures.  Patient now on meropenem.  12/13.  Repeat blood cultures drawn 12/14.  Creatinine 2.39, hemoglobin 7.6, white blood cell count 10.8, platelet count 32  Assessment and Plan: * Severe sepsis (HCC) Present on admission with leukocytosis, tachycardia, hypotension, acute kidney injury acute metabolic encephalopathy.  ESBL klebsiella growing out of blood cultures.  Repeat cultures so far negative.  Currently on meropenem.  ID following recommends 2 weeks of IV meropenem.  Will watch through the weekend.  Acute kidney injury superimposed on CKD (HCC) Acute kidney injury on CKD stage IIIa.  Creatinine peaked at 2.8 and current creatinine 2.39.  Acute metabolic encephalopathy Mental status seems improved  Generalized weakness Physical therapy recommending rehab  Anemia of chronic disease Last hemoglobin 7.6.  May end up needing a blood transfusion.  Thrombocytopenia [facility]) Oncology may decide to do a bone marrow biopsy on Monday or Tuesday.  Cyst of right kidney Enlarged from previous.  Right adrenal mass (HCC) Will need follow-up as outpatient  Myocardial injury Demand ischemia from sepsis  Chronic systolic CHF (congestive heart failure) (HCC) Currently no signs of heart failure.        Subjective: Patient feeling okay.  Offers no complaints.  Still feels weak.  Came from  rehab.  Admitted with altered mental status and a fall.  Found to have Klebsiella sepsis  Physical Exam: Vitals:   04/06/23 0437 04/06/23 0902 04/06/23 1208 04/06/23 1306  BP: 104/65 116/84 116/77 107/81  Pulse: (!) 128 (!) 126  (!) 111  Resp: 20 18    Temp: 98.4 F (36.9 C) 98.5 F (36.9 C) 98.4 F (36.9 C)   TempSrc: Oral Axillary Axillary   SpO2: 100% 100% 100% 94%  Weight:      Height:       Physical Exam HENT:     Head: Normocephalic.  Eyes:     General: Lids are normal.     Conjunctiva/sclera: Conjunctivae normal.  Cardiovascular:     Rate and Rhythm: Normal rate and regular rhythm.     Heart sounds: Normal heart sounds, S1 normal and S2 normal.  Pulmonary:     Breath sounds: No decreased breath sounds, wheezing, rhonchi or rales.  Abdominal:     Palpations: Abdomen is soft.     Tenderness: There is no abdominal tenderness.  Musculoskeletal:     Right lower leg: Swelling present.     Left lower leg: Swelling present.  Skin:    General: Skin is warm.     Findings: No rash.  Neurological:     Mental Status: He is alert.     Data Reviewed: Creatinine 2.39, potassium 3.2, albumin less than 1.5, platelets 32, hemoglobin 7.6, white blood cell count 10.8  Family Communication: Tried to call patient's wife  Disposition: Status is: Inpatient Remains inpatient appropriate because: Will need 2 weeks of IV antibiotics.  Repeat blood cultures negative for less than 12 hours.  Planned Discharge Destination:  Rehab    Time spent: 29 minutes Case discussed with ID  Author: Alford Highland, MD 04/06/2023 5:02 PM  For on call review [email or web address].

## 2023-04-06 NOTE — Assessment & Plan Note (Addendum)
Mental status improved from admission. ?

## 2023-04-06 NOTE — Consult Note (Signed)
Pharmacy Antibiotic Note  ASSESSMENT: 78 y.o. male with PMH including UTI (ESBL K. Pneumo), COPD (3L O2 baseline) is presenting with sepsis/ESBL bacteremia. O2Sats 100% on baseline O2 but he is hypotensive. Regarding ESBL concern, patient presented to ED with UTI in 12/2022 and discharged on cephalexin. Ucx later resulted with 90,000 CFU ESBL K. Pneumo (susceptible to imipenem)  Pharmacy has been consulted to manage meropenem and vancomycin dosing. Vancomycin dc, now on meropenem monotherapy  Patient measurements: Height: 6\' 2"  (188 cm) Weight: 95.3 kg (210 lb 1.6 oz) IBW/kg (Calculated) : 82.2  Vital signs: Temp: 98.4 F (36.9 C) (02/14 0437) Temp Source: Oral (02/14 0437) BP: 104/65 (02/14 0437) Pulse Rate: 128 (02/14 0437) Recent Labs  Lab 04/04/23 0459 04/05/23 0605 04/05/23 1218 04/06/23 0451  WBC 17.4*  --  12.3* 10.8*  CREATININE 2.80* 2.63*  --  2.39*   Estimated Creatinine Clearance: 29.6 mL/min (A) (by C-G formula based on SCr of 2.39 mg/dL (H)).  Allergies: No Known Allergies  Antimicrobials this admission: Cefepime, metronidazole 2/11 x 1 Vancomycin 2/11 x 1 Meropenem 2/11 >>  Dose adjustments this admission: N/A  Microbiology results: 2/11 BCx: sent 2/11 UCx: sent 2/11 MRSA PCR: ordered 2/11 Respiratory panel PCR: COVID(+), possibly residual  PLAN: Continue meropenem 1 g IV q12H ID follow up for ESBL bacteremia Monitor renal function to assess for any necessary antibiotic dosing changes.   Keina Mutch Rodriguez-Guzman PharmD, BCPS 04/06/2023 8:29 AM

## 2023-04-06 NOTE — Hospital Course (Addendum)
Hospital course / significant events:   HPI: 78 year old man with recently diagnosed sigmoid colon cancer status post sigmoid colon partial colectomy in November 2024, chronic anemia secondary to CKD, CKD stage IIIa, ESBL UTI in November 2024, chronic heart failure with reduced ejection fraction 40%, pericardial effusion status post pericardial window.  Sent in from nursing home for fall hypotension and altered mental status.  02/11: admitted to hospitalist service for severe sepsis, AKI, encephalopathy, hypotension. Suspect UTI. BCx collected. Given PRBC.  02/12: BCx (+)ESBL Klebsiella, on Meropenem, ID consulted  02/13.  Repeat blood cultures drawn 02/14.  Cr 2.39, Hgb 7.6, WBC 10.8, Plt 32 02/15.  Hgb down to 7.1 transfuse 1 unit PRBC.  Plt 26.  Cr down to 2.22. 02/16.  Cr down to 2.02, Hgb up to 8.2 after transfusion, Plt still low at 23. 02/17.  Cr down to 1.79, Hgb 7.7, Plt 25 02/18.  Cr 1.73, Hgb  7.2 and Plt 32.  Underwent bone marrow biopsy today.  Will transfuse 1 unit PRBC.  Case discussed with oncology and they will follow-up as outpatient and okay to go out to rehab with low platelets.  Case discussed with ID and OPAT orders were placed for IV antibiotics back at facility 02/19: pending placement/auth. Weather will likely mean no discharge today even if Berkley Harvey goes through.    Consultants:  Infectious disease  Hematology/Oncology  Palliative Care  Interventional Radiology   Procedures/Surgeries: 04/10/2023 Bone marrow biopsy - IR, Dr Archer Asa      ASSESSMENT & PLAN:   Severe sepsis w/ ESBL Klebsiella bacteremia d/t UTI Present on admission with leukocytosis, tachycardia, hypotension, acute kidney injury acute metabolic encephalopathy. Repeat cultures so far negative for 5 days.   Currently on meropenem.  ID following recommends 2 weeks total of IV antibiotics.  Midline placed.  OPAT orders placed by ID.   Hopefully can get back to facility soon to complete antibiotic  course.   Thrombocytopenia S/p Bone marrow biopsy.   discussed with Dr. Smith Robert and she is okay getting back to facility with the low platelets.  She will follow-up patient with appointment on 2/24.   Acute kidney injury superimposed on CKD 3a Creatinine peaked at 2.8 and current creatinine 1.73. Monitor BMP   Hypokalemia Replace orally    Anemia of chronic disease S/p PRBC x1 unit on 02/15 and 02/18 Follow w/ HemOnc Monitor CBC   Acute metabolic encephalopathy Mental status improved from admission Monitor    Generalized weakness Physical therapy recommending rehab   Hypomagnesemia Replace orally. Monitor serum levels    Right adrenal mass  Cyst of right kidney enlarged from previous. Follow outpatient with urology   Myocardial injury Demand ischemia from sepsis, ACS ruled out  Repeat troponin/EKG if chest pain   Chronic systolic CHF (congestive heart failure)  Currently no signs of heart failure.  Most recent EF 45 to 50%.   Lopressor switched over to Toprol.  consider restarting low-dose Lasix upon discharge.        overweight based on BMI: Body mass index is 26.98 kg/m.  Underweight - under 18  overweight - 25 to 29 obese - 30 or more Class 1 obesity: BMI of 30.0 to 34 Class 2 obesity: BMI of 35.0 to 39 Class 3 obesity: BMI of 40.0 to 49 Super Morbid Obesity: BMI 50-59 Super-super Morbid Obesity: BMI 60+ Significantly low or high BMI is associated with higher medical risk.  Weight management advised as adjunct to other disease management and risk reduction treatments  DVT prophylaxis: no Rx ppx d/t low Plt IV fluids: no continuous IV fluids  Nutrition: regular diet Central lines / invasive devices: midline IV   Code Status: DNR ACP documentation reviewed:  none on file in [facility]  TOC needs: placement Barriers to dispo / significant pending items: insurance auth pending for SNF

## 2023-04-06 NOTE — Progress Notes (Signed)
PHARMACIST - PHYSICIAN COMMUNICATION  DR:   Dr Hilton Sinclair  CONCERNING: IV to Oral Route Change Policy  RECOMMENDATION: This patient is receiving pantoprazole by the intravenous route.  Based on criteria approved by the Pharmacy and Therapeutics Committee, the intravenous medication(s) is/are being converted to the equivalent oral dose form(s).   DESCRIPTION: These criteria include: The patient is eating (either orally or via tube) and/or has been taking other orally administered medications for a least 24 hours The patient has no evidence of active gastrointestinal bleeding or impaired GI absorption (gastrectomy, short bowel, patient on TNA or NPO).   Robert [name] PharmD, BCPS 04/06/2023 8:40 AM

## 2023-04-06 NOTE — Assessment & Plan Note (Addendum)
Acute kidney injury on CKD stage IIIa.  Creatinine peaked at 2.8 and current creatinine 2.22.

## 2023-04-06 NOTE — Assessment & Plan Note (Addendum)
Present on admission with leukocytosis, tachycardia, hypotension, acute kidney injury acute metabolic encephalopathy.  ESBL klebsiella growing out of blood cultures.  Repeat cultures so far negative.  Currently on meropenem.  ID following recommends 2 weeks of IV meropenem.  ID will reevaluate on Monday.

## 2023-04-06 NOTE — Assessment & Plan Note (Addendum)
Today's hemoglobin 7.7.  Received a unit of blood on 2/15.

## 2023-04-06 NOTE — Assessment & Plan Note (Addendum)
Will need follow-up as outpatient with urology

## 2023-04-06 NOTE — Assessment & Plan Note (Signed)
Physical therapy recommending rehab

## 2023-04-06 NOTE — Assessment & Plan Note (Signed)
Enlarged from previous.

## 2023-04-06 NOTE — Assessment & Plan Note (Addendum)
Currently no signs of heart failure.

## 2023-04-06 NOTE — Assessment & Plan Note (Signed)
Demand ischemia from sepsis.

## 2023-04-06 NOTE — Assessment & Plan Note (Addendum)
Oncology ordered a bone marrow biopsy.  Today's platelets 23.

## 2023-04-06 NOTE — TOC Transition Note (Signed)
Transition of Care [facility]) - Discharge Note   Patient Details  Name: Robert [name] MRN: [id number] Date of Birth: 1945/08/22  [facility] CM/SW Contact:  Hetty Ely, RN Phone Number: 04/06/2023, 4:22 PM   Clinical Narrative: Per Dabe at [facility] patient can discharge back to [facility] with IV Abx, they will need dose and last date. LC spoke with wife about LTC, which may take place following IV Abx/ SNF days ends.            Patient Goals and CMS Choice            Discharge Placement                       Discharge Plan and Services Additional resources added to the After Visit Summary for                                       Social Drivers of Health (SDOH) Interventions SDOH Screenings   Food Insecurity: Patient Unable To Answer (04/04/2023)  Housing: Patient Unable To Answer (04/04/2023)  Transportation Needs: Patient Unable To Answer (04/04/2023)  Utilities: Patient Unable To Answer (04/04/2023)  Alcohol Screen: Low Risk  (10/29/2020)  Depression (PHQ2-9): Low Risk  (09/12/2021)  Financial Resource Strain: Low Risk  (12/15/2022)   Received from [facility]  Physical Activity: Inactive (10/29/2020)  Social Connections: Patient Unable To Answer (04/04/2023)  Stress: No Stress Concern Present (10/29/2020)  Tobacco Use: Low Risk  (04/04/2023)     Readmission Risk Interventions    04/04/2023   12:56 PM 02/07/2023   12:33 PM  Readmission Risk Prevention Plan  Transportation Screening Complete Complete  Medication Review (RN Care Manager) Complete Complete  PCP or Specialist appointment within 3-5 days of discharge Complete Complete  SW Recovery Care/Counseling Consult Complete Complete  Palliative Care Screening Not Applicable Not Applicable  Skilled Nursing Facility Complete Complete

## 2023-04-07 DIAGNOSIS — N179 Acute kidney failure, unspecified: Secondary | ICD-10-CM | POA: Diagnosis not present

## 2023-04-07 DIAGNOSIS — E876 Hypokalemia: Secondary | ICD-10-CM

## 2023-04-07 DIAGNOSIS — A498 Other bacterial infections of unspecified site: Secondary | ICD-10-CM | POA: Diagnosis not present

## 2023-04-07 DIAGNOSIS — A419 Sepsis, unspecified organism: Secondary | ICD-10-CM | POA: Diagnosis not present

## 2023-04-07 LAB — CBC
HCT: 24.2 % — ABNORMAL LOW (ref 39.0–52.0)
Hemoglobin: 7.1 g/dL — ABNORMAL LOW (ref 13.0–17.0)
MCH: 28.2 pg (ref 26.0–34.0)
MCHC: 29.3 g/dL — ABNORMAL LOW (ref 30.0–36.0)
MCV: 96 fL (ref 80.0–100.0)
Platelets: 26 10*3/uL — CL (ref 150–400)
RBC: 2.52 MIL/uL — ABNORMAL LOW (ref 4.22–5.81)
RDW: 20.4 % — ABNORMAL HIGH (ref 11.5–15.5)
WBC: 9.6 10*3/uL (ref 4.0–10.5)
nRBC: 0.2 % (ref 0.0–0.2)

## 2023-04-07 LAB — BASIC METABOLIC PANEL
Anion gap: 5 (ref 5–15)
BUN: 41 mg/dL — ABNORMAL HIGH (ref 8–23)
CO2: 27 mmol/L (ref 22–32)
Calcium: 7.7 mg/dL — ABNORMAL LOW (ref 8.9–10.3)
Chloride: 106 mmol/L (ref 98–111)
Creatinine, Ser: 2.22 mg/dL — ABNORMAL HIGH (ref 0.61–1.24)
GFR, Estimated: 30 mL/min — ABNORMAL LOW (ref >60)
Glucose, Bld: 168 mg/dL — ABNORMAL HIGH (ref 70–99)
Potassium: 3.5 mmol/L (ref 3.5–5.1)
Sodium: 138 mmol/L (ref 135–145)

## 2023-04-07 LAB — PREPARE RBC (CROSSMATCH)

## 2023-04-07 LAB — MAGNESIUM: Magnesium: 1.3 mg/dL — ABNORMAL LOW (ref 1.7–2.4)

## 2023-04-07 LAB — GLUCOSE, CAPILLARY
Glucose-Capillary: 141 mg/dL — ABNORMAL HIGH (ref 70–99)
Glucose-Capillary: 119 mg/dL — ABNORMAL HIGH (ref 70–99)
Glucose-Capillary: 107 mg/dL — ABNORMAL HIGH (ref 70–99)
Glucose-Capillary: 141 mg/dL — ABNORMAL HIGH (ref 70–99)

## 2023-04-07 MED ORDER — HYDROXYZINE HCL 25 MG PO TABS
25.0000 mg | ORAL_TABLET | Freq: Three times a day (TID) | ORAL | Status: DC | PRN
  Administered 2023-04-09: 25 mg via ORAL
  Filled 2023-04-07: qty 1

## 2023-04-07 MED ORDER — ACETAMINOPHEN 325 MG PO TABS
650.0000 mg | ORAL_TABLET | Freq: Once | ORAL | Status: AC
  Administered 2023-04-07: 650 mg via ORAL
  Filled 2023-04-07: qty 2

## 2023-04-07 MED ORDER — MAGNESIUM SULFATE 4 GM/100ML IV SOLN
4.0000 g | Freq: Once | INTRAVENOUS | Status: AC
  Administered 2023-04-07: 4 g via INTRAVENOUS
  Filled 2023-04-07: qty 100

## 2023-04-07 MED ORDER — FUROSEMIDE 10 MG/ML IJ SOLN
40.0000 mg | Freq: Once | INTRAMUSCULAR | Status: AC
  Administered 2023-04-07: 40 mg via INTRAVENOUS
  Filled 2023-04-07: qty 4

## 2023-04-07 MED ORDER — METOPROLOL TARTRATE 50 MG PO TABS
50.0000 mg | ORAL_TABLET | Freq: Two times a day (BID) | ORAL | Status: DC
  Administered 2023-04-07 – 2023-04-09 (×4): 50 mg via ORAL
  Filled 2023-04-07 (×5): qty 1

## 2023-04-07 MED ORDER — SODIUM CHLORIDE 0.9% IV SOLUTION: Freq: Once | INTRAVENOUS | Status: AC

## 2023-04-07 NOTE — Consult Note (Signed)
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   By: Trudie Reed M.D.   On: 04/03/2023 06:41   CT HEAD WO CONTRAST ( ) Result Date: 03/17/2023 CLINICAL DATA:  Headache. EXAM: CT HEAD WITHOUT CONTRAST TECHNIQUE: Contiguous axial images were obtained from the base of the skull through the vertex without intravenous contrast. RADIATION DOSE REDUCTION: This exam was performed according to the departmental dose-optimization program which includes automated exposure control, adjustment of the mA and/or kV according to patient size and/or use of iterative reconstruction technique. COMPARISON:  CT head without contrast 01/08/2023 FINDINGS: Brain: Moderate atrophy and white matter changes are stable. No acute infarct, hemorrhage, or mass lesion is present. Deep brain nuclei are within normal limits. The ventricles are of normal size. No significant extraaxial fluid collection is present. The brainstem and cerebellum are within normal limits. Midline structures are within normal limits. Vascular: Atherosclerotic calcifications are present within the cavernous internal carotid arteries at  the dural margin of the right vertebral artery and in the distal left vertebral artery. No hyperdense vessel is present. Skull: Calvarium is intact. No focal lytic or blastic lesions are present. No significant extracranial soft tissue lesion is present. Sinuses/Orbits: Near-total opacification of the right maxillary sinus present. Hyperdensity is noted centrally. A fluid level is present left maxillary sinus. The ethmoid air cells are opacified bilaterally. Fluid levels are present in the frontal sinuses bilaterally and in both sphenoid sinuses. Left middle ear and mastoid effusion is present. No osseous destruction is present. Bilateral lens replacements are noted. Globes and orbits are otherwise unremarkable. IMPRESSION: 1. No acute intracranial abnormality or significant interval change. 2. Stable atrophy and white matter disease. This likely reflects the sequela of chronic microvascular ischemia. 3. Extensive sinus disease. This could be the etiology of the patient's headache. 4. Left middle ear and mastoid effusion. Electronically Signed   By: Marin Roberts M.D.   On: 03/17/2023 18:55   DG Chest 2 View Result Date: 03/17/2023 CLINICAL DATA:  Cough. EXAM: CHEST - 2 VIEW COMPARISON:  Chest radiograph dated February 06, 2023. FINDINGS: Stable cardiomediastinal silhouette. Aortic atherosclerosis. Slightly increased size of a small right pleural effusion with associated basilar atelectasis. Right basilar scarring. Suspected trace left pleural effusion, similar to the prior exam. Mild bilateral central perihilar interstitial prominence. No pneumothorax. No acute osseous abnormality. IMPRESSION: 1. Persistent slightly increased size of a small right pleural effusion with right basilar atelectasis and scarring. 2. Suspected trace left pleural effusion, similar to the prior exam. 3. Mild bilateral central perihilar prominence could reflect pulmonary vascular congestion. Electronically Signed   By: Hart Robinsons M.D.   On: 03/17/2023 14:49   US ARTERIAL LOWER EXTREMITY DUPLEX BILATERAL Result Date: 03/14/2023 CLINICAL DATA:  Diminished pulses EXAM: BILATERAL LOWER EXTREMITY ARTERIAL DUPLEX SCAN [place] ABIs TECHNIQUE: Gray-scale sonography as well as color Doppler and duplex ultrasound was performed to evaluate the arteries of both lower extremities including the common, superficial and profunda femoral arteries, popliteal  artery and calf arteries. COMPARISON:  None Available. FINDINGS: Right Lower Extremity ABI: 1.1 Inflow: Normal common femoral arterial waveforms and velocities. No evidence of inflow (aortoiliac) disease. Outflow: Normal profunda femoral, superficial femoral and popliteal arterial waveforms and velocities. No focal elevation of the PSV to suggest stenosis. Runoff: Normal posterior and anterior tibial arterial waveforms and velocities. Vessels are patent to the ankle. Left Lower Extremity ABI: 1.0 Inflow: Normal common femoral arterial waveforms and velocities. No evidence of inflow (aortoiliac) disease. Outflow: Normal profunda femoral, superficial femoral and popliteal arterial waveforms and velocities. No focal elevation of the PSV to suggest stenosis. Runoff: Normal posterior and anterior tibial arterial waveforms and velocities. Vessels are patent to the ankle. IMPRESSION: 1. Normal bilateral resting ankle-brachial indices. 2. Very mild scattered atherosclerotic plaque without hemodynamically significant arterial stenosis or occlusion. Electronically Signed   By: Malachy Moan M.D.   On: 03/14/2023 09:15   US ARTERIAL ABI (SCREENING LOWER EXTREMITY) Result Date: 03/14/2023 CLINICAL DATA:  Diminished pulses EXAM: BILATERAL LOWER EXTREMITY ARTERIAL DUPLEX SCAN [place] ABIs TECHNIQUE: Gray-scale sonography as well as color Doppler and duplex ultrasound was performed to evaluate the arteries of both lower extremities including the common, superficial and profunda femoral arteries, popliteal artery  and calf arteries. COMPARISON:  None Available. FINDINGS: Right Lower Extremity ABI: 1.1 Inflow: Normal common femoral arterial waveforms and velocities. No evidence of inflow (aortoiliac) disease. Outflow: Normal profunda femoral, superficial femoral and popliteal arterial waveforms and velocities. No focal elevation of the PSV to suggest stenosis. Runoff: Normal posterior and anterior tibial arterial waveforms and velocities. Vessels are patent to the ankle. Left Lower Extremity ABI: 1.0 Inflow: Normal common femoral arterial waveforms and velocities. No evidence of inflow (aortoiliac) disease. Outflow: Normal profunda femoral, superficial femoral and popliteal arterial waveforms and velocities. No focal elevation of the PSV to suggest stenosis. Runoff: Normal posterior and anterior tibial arterial waveforms and velocities. Vessels are patent to the ankle. IMPRESSION: 1. Normal bilateral resting ankle-brachial indices. 2. Very mild scattered atherosclerotic plaque without hemodynamically significant arterial stenosis or occlusion. Electronically Signed   By: Malachy Moan M.D.   On: 03/14/2023 09:15    Assessment and plan- Patient is a 78 y.o. male admitted for recurrent UTI and ESBL related sepsis.  Hematology consulted for pancytopenia.  Patient has had pancytopenia with mainly anemia and thrombocytopenia at least since July 2024.  Platelets were mostly in the 100 but further drop down to this 60s to 70s in January 2025.  Presently with this present hospitalization his platelets are further down to the 20s.  I suspect the acute drop in platelets is secondary to underlying sepsis but given his significant anemia and thrombocytopenia which has not been explained by peripheral blood workup done in late January 2025 will plan to proceed with a bone marrow biopsy at this time early next week.  Discussed risks and benefits of bone marrow biopsy with the patient and he agrees to proceed.  Continue supportive  transfusions for hemoglobin less than 7 and platelets less than 10   Thank you for this kind referral and the opportunity to participate in the care of this patient   Visit Diagnosis 1. Fall, initial encounter   2. Altered mental status, unspecified altered mental status type   3. Anemia, unspecified type   4. AKI (acute kidney injury) (HCC)   5. SIRS (systemic inflammatory response syndrome) (HCC)   6. Sepsis, due to unspecified organism, unspecified whether acute organ dysfunction present (HCC)   7. Symptomatic  anemia   8. Leukocytosis, unspecified type   9. Community acquired pneumonia of right lower lobe of lung     Dr. Owens Shark, MD, MPH [facility] [phone] 04/07/2023

## 2023-04-07 NOTE — Plan of Care (Signed)
  Problem: Education: Goal: Knowledge of General Education information will improve Description: Including pain rating scale, medication(s)/side effects and non-pharmacologic comfort measures Outcome: Progressing   Problem: Nutrition: Goal: Adequate nutrition will be maintained Outcome: Progressing   Problem: Pain Managment: Goal: General experience of comfort will improve and/or be controlled Outcome: Progressing   Problem: Skin Integrity: Goal: Risk for impaired skin integrity will decrease Outcome: Not Progressing   Problem: Tissue Perfusion: Goal: Adequacy of tissue perfusion will improve Outcome: Not Progressing   Problem: Clinical Measurements: Goal: Ability to maintain clinical measurements within normal limits will improve Outcome: Not Progressing Goal: Will remain free from infection Outcome: Not Progressing Goal: Respiratory complications will improve Outcome: Not Progressing Goal: Cardiovascular complication will be avoided Outcome: Not Progressing   Problem: Activity: Goal: Risk for activity intolerance will decrease Outcome: Not Progressing

## 2023-04-07 NOTE — Assessment & Plan Note (Signed)
 Replaced

## 2023-04-07 NOTE — Progress Notes (Signed)
Progress Note   Patient: Robert [name] JWJ:[id number] DOB: Nov 24, 1945 DOA: 04/03/2023     4 DOS: the patient was seen and examined on 04/07/2023   Brief hospital course: 78 year old man with recently diagnosed sigmoid colon cancer status post sigmoid colon partial colectomy in November 2024, chronic anemia secondary to CKD, CKD stage IIIa, ESBL UTI in November 2024, chronic heart failure with reduced ejection fraction 40%, pericardial effusion status post pericardial window.  Sent in from nursing home for fall hypotension and altered mental status.  Klebsiella pneumoniae growing out of blood cultures.  Patient now on meropenem.  12/13.  Repeat blood cultures drawn 12/14.  Creatinine 2.39, hemoglobin 7.6, white blood cell count 10.8, platelet count 32 12/15.  Hemoglobin down to 7.1 transfuse 1 unit of packed red blood cells.  Platelet count 26.  Creatinine down to 2.22.  Assessment and Plan: * Severe sepsis (HCC) Present on admission with leukocytosis, tachycardia, hypotension, acute kidney injury acute metabolic encephalopathy.  ESBL klebsiella growing out of blood cultures.  Repeat cultures so far negative.  Currently on meropenem.  ID following recommends 2 weeks of IV meropenem.  ID will reevaluate on Monday.  Acute kidney injury superimposed on CKD (HCC) Acute kidney injury on CKD stage IIIa.  Creatinine peaked at 2.8 and current creatinine 2.22.  Hypokalemia Replace potassium orally today  Hypomagnesemia 4 g IV magnesium today  Acute metabolic encephalopathy Mental status seems improved  Generalized weakness Physical therapy recommending rehab  Anemia of chronic disease Last hemoglobin 7.1.  Will give a blood transfusion today.  Benefits and risk explained to patient.  Thrombocytopenia [facility]) Oncology may decide to do a bone marrow biopsy on Monday or Tuesday.  Cyst of right kidney Enlarged from previous.  Right adrenal mass (HCC) Will need follow-up as  outpatient  Myocardial injury Demand ischemia from sepsis  Chronic systolic CHF (congestive heart failure) (HCC) Currently no signs of heart failure.  Will give a dose of Lasix with the blood.        Subjective: Patient feels okay.  Agreeable to blood transfusion.  Hemoglobin 7.1.  Physical Exam: Vitals:   04/06/23 2308 04/07/23 0908 04/07/23 1223 04/07/23 1315  BP:  113/80 122/87 122/89  Pulse: (!) 140 (!) 128 (!) 107 (!) 126  Resp:  19 16 18   Temp:  97.9 F (36.6 C) 98 F (36.7 C) 97.8 F (36.6 C)  TempSrc:      SpO2: 98% 95% (!) 87% 100%  Weight:      Height:       Physical Exam HENT:     Head: Normocephalic.  Eyes:     General: Lids are normal.     Conjunctiva/sclera: Conjunctivae normal.  Cardiovascular:     Rate and Rhythm: Normal rate and regular rhythm.     Heart sounds: Normal heart sounds, S1 normal and S2 normal.  Pulmonary:     Breath sounds: No decreased breath sounds, wheezing, rhonchi or rales.  Abdominal:     Palpations: Abdomen is soft.     Tenderness: There is no abdominal tenderness.  Musculoskeletal:     Right lower leg: Swelling present.     Left lower leg: Swelling present.  Skin:    General: Skin is warm.     Findings: No rash.  Neurological:     Mental Status: He is alert.     Data Reviewed: Creatinine 2.22, magnesium 1.3, hemoglobin 7.1, platelet count 26, white blood cell count 9.6  Family Communication: Spoke with wife  on the phone  Disposition: Status is: Inpatient Remains inpatient appropriate because: We will transfuse 1 unit of packed red blood cells.  Will need 2 weeks of IV antibiotics.  Planned Discharge Destination: Rehab    Time spent: 28 minutes  Author: Alford Highland, MD 04/07/2023 2:31 PM  For on call review [email or web address].

## 2023-04-07 NOTE — Assessment & Plan Note (Signed)
Replace potassium orally today 

## 2023-04-07 NOTE — Plan of Care (Signed)

## 2023-04-07 NOTE — Progress Notes (Signed)
OT Cancellation Note  Patient Details Name: Robert [name] MRN: [id number] DOB: November 05, 1945   Cancelled Treatment:    Reason Eval/Treat Not Completed: Medical issues which prohibited therapy. Spoke with RN. Resting HR in 120s. Also currently receiving blood transfusion. Will re-attempt as able.  Gerrie Nordmann 04/07/2023, 3:58 PM

## 2023-04-07 NOTE — Evaluation (Signed)
Occupational Therapy Evaluation Patient Details Name: Robert [name] MRN: [id number] DOB: 09-14-1945 Today's Date: 04/07/2023   History of Present Illness   presented to ER secondary to fall, hypotension and AMS; admitted for management of severe sepsis with acute encephalopathy secondary to ESBL klebsiella bacteremia.     Clinical Impressions Patient agreeable to OT evaluation. Pt presents to acute OT demonstrating impaired ADL performance and functional mobility 2/2 decreased strength, endurance, balance (See OT problem list for additional functional deficits). Pt currently requires Mod A for supine to sit, CGA for sit to supine, SBA for scooting up in bed, and Total A for LB dressing. Additional mobility deferred due to tachycardia (120-130s), RN aware. Pt would benefit from skilled OT services to address noted impairments and functional limitations (see below for any additional details) in order to maximize safety and independence while minimizing falls risk and caregiver burden. Anticipate the need for follow up OT services upon acute hospital DC.       If plan is discharge home, recommend the following:   Two people to help with bathing/dressing/bathroom;Two people to help with walking and/or transfers     Functional Status Assessment   Patient has had a recent decline in their functional status and demonstrates the ability to make significant improvements in function in a reasonable and predictable amount of time.     Equipment Recommendations   Other (comment) (defer to next venue of care)     Recommendations for Other Services         Precautions/Restrictions   Precautions Precautions: Fall Restrictions Weight Bearing Restrictions Per Provider Order: No     Mobility Bed Mobility Overal bed mobility: Needs Assistance Bed Mobility: Supine to Sit, Sit to Supine     Supine to sit: Mod assist Sit to supine: Contact guard assist   General bed mobility  comments: Pt appeared generally anxious sitting at EOB, Max A required to scoot hips forward. Scooting up in bed SBA using bed features, hand rails, and pushing through BLE.    Transfers     General transfer comment: deferred due to HR      Balance Overall balance assessment: Needs assistance, History of Falls Sitting-balance support: Bilateral upper extremity supported, Feet supported Sitting balance-[name]: Poor Sitting balance - Comments: Multiple minor LOB in sitting while trying to reposition requiring VC to correct. Postural control: Posterior lean     Standing balance comment: deferred due to HR           ADL either performed or assessed with clinical judgement   ADL Overall ADL's : Needs assistance/impaired Eating/Feeding: Modified independent;Bed level     Grooming Details (indicate cue type and reason): anticipate Set up A at bed level           Upper Body Dressing Details (indicate cue type and reason): anticipate Min A seated Lower Body Dressing: Total assistance;Bed level Lower Body Dressing Details (indicate cue type and reason): to don B socks       Toileting - Clothing Manipulation Details (indicate cue type and reason): anticipate Total A       General ADL Comments: Evaluation limited d/t tachycardia.     Vision Patient Visual Report: No change from baseline       Perception         Praxis         Pertinent Vitals/Pain Pain Assessment Pain Assessment: No/denies pain     Extremity/Trunk Assessment Upper Extremity Assessment Upper Extremity Assessment: Generalized weakness  Lower Extremity Assessment Lower Extremity Assessment: Generalized weakness       Communication     Cognition Arousal: Alert Behavior During Therapy: WFL for tasks assessed/performed           Cueing  General Comments          Exercises Other Exercises Other Exercises: OT provided education re: role of OT, OT POC, post acute recs,  sitting up for all meals, EOB/OOB mobility with assistance, home/fall safety.     Shoulder Instructions      Home Living Family/patient expects to be discharged to:: Skilled nursing facility     Additional Comments: At baseline, lives at home with wife in single-story home with ramp access; most recently at [facility] for rehab post-recent hospitalizations      Prior Functioning/Environment Prior Level of Function : Needs assist             Mobility Comments: At baseline, mod indep with [facility] for mobilization.  Most recently, walking up to 71' with RW at [facility] follow for safety) ADLs Comments: Pt stated he needed assist for full body dressing at SNF. Assist for all transfers. Anticipate assist for toileting, LB bathing, set up grooming from seated.    OT Problem List: Decreased strength;Decreased range of motion;Decreased activity tolerance;Impaired balance (sitting and/or standing);Decreased knowledge of use of DME or AE;Cardiopulmonary status limiting activity;Decreased coordination;Decreased cognition;Decreased safety awareness;Decreased knowledge of precautions   OT Treatment/Interventions: Self-care/ADL training;Therapeutic exercise;Energy conservation;DME and/or AE instruction;Therapeutic activities;Cognitive remediation/compensation;Patient/family education;Balance training      OT Goals(Current goals can be found in the care plan section)   Acute Rehab OT Goals Patient Stated Goal: get stronger OT Goal Formulation: With patient Time For Goal Achievement: 04/21/23 Potential to Achieve Goals: Fair    OT Frequency:  Min 1X/week    Co-evaluation              AM-PAC OT "6 Clicks" Daily Activity     Outcome Measure Help from another person eating meals?: None Help from another person taking care of personal grooming?: A Little Help from another person toileting, which includes using toliet, bedpan, or urinal?: Total Help from another person bathing (including washing,  rinsing, drying)?: A Lot Help from another person to put on and taking off regular upper body clothing?: A Little Help from another person to put on and taking off regular lower body clothing?: Total 6 Click Score: 14   End of Session Equipment Utilized During Treatment: Rolling walker (2 wheels);Oxygen Nurse Communication: Mobility status;Other (comment) (HR)  Activity Tolerance: Treatment limited secondary to medical complications (Comment) (limited by HR) Patient left: in bed;with call bell/phone within reach;with bed alarm set  OT Visit Diagnosis: Unsteadiness on feet (R26.81);Other abnormalities of gait and mobility (R26.89);History of falling (Z91.81);Muscle weakness (generalized) (M62.81)                Time: 4782-9562 OT Time Calculation (min): 26 min Charges:  OT General Charges $OT Visit: 1 Visit OT Evaluation $OT Eval Moderate Complexity: 1 Mod  [facility] MS, OTR/L ascom [phone]  04/07/23, 5:28 PM

## 2023-04-08 DIAGNOSIS — D638 Anemia in other chronic diseases classified elsewhere: Secondary | ICD-10-CM | POA: Diagnosis not present

## 2023-04-08 DIAGNOSIS — A419 Sepsis, unspecified organism: Secondary | ICD-10-CM | POA: Diagnosis not present

## 2023-04-08 DIAGNOSIS — N179 Acute kidney failure, unspecified: Secondary | ICD-10-CM | POA: Diagnosis not present

## 2023-04-08 DIAGNOSIS — E876 Hypokalemia: Secondary | ICD-10-CM | POA: Diagnosis not present

## 2023-04-08 LAB — BASIC METABOLIC PANEL
Anion gap: 7 (ref 5–15)
BUN: 39 mg/dL — ABNORMAL HIGH (ref 8–23)
CO2: 28 mmol/L (ref 22–32)
Calcium: 7.8 mg/dL — ABNORMAL LOW (ref 8.9–10.3)
Chloride: 104 mmol/L (ref 98–111)
Creatinine, Ser: 2.02 mg/dL — ABNORMAL HIGH (ref 0.61–1.24)
GFR, Estimated: 33 mL/min — ABNORMAL LOW (ref >60)
Glucose, Bld: 88 mg/dL (ref 70–99)
Potassium: 3.9 mmol/L (ref 3.5–5.1)
Sodium: 139 mmol/L (ref 135–145)

## 2023-04-08 LAB — CBC
HCT: 26.8 % — ABNORMAL LOW (ref 39.0–52.0)
Hemoglobin: 8.2 g/dL — ABNORMAL LOW (ref 13.0–17.0)
MCH: 27.5 pg (ref 26.0–34.0)
MCHC: 30.6 g/dL (ref 30.0–36.0)
MCV: 89.9 fL (ref 80.0–100.0)
Platelets: 23 10*3/uL — CL (ref 150–400)
RBC: 2.98 MIL/uL — ABNORMAL LOW (ref 4.22–5.81)
RDW: 23.2 % — ABNORMAL HIGH (ref 11.5–15.5)
WBC: 9 10*3/uL (ref 4.0–10.5)
nRBC: 0 % (ref 0.0–0.2)

## 2023-04-08 LAB — GLUCOSE, CAPILLARY
Glucose-Capillary: 91 mg/dL (ref 70–99)
Glucose-Capillary: 153 mg/dL — ABNORMAL HIGH (ref 70–99)
Glucose-Capillary: 129 mg/dL — ABNORMAL HIGH (ref 70–99)
Glucose-Capillary: 140 mg/dL — ABNORMAL HIGH (ref 70–99)

## 2023-04-08 NOTE — Consult Note (Signed)
Chief Complaint: Patient was seen in consultation today for pancytopenia, with consideration for bone marrow biopsy and aspiration.  Referring Provider(s): Dr. Alveda Reasons, MD  Supervising Physician: Roanna Banning  Patient Status: [facility] - In-pt  Current Code Status  Limited: Do not attempt resuscitation (DNR) -DNR-LIMITED -Do Not Intubate/DNI - Set by Emeline General, MD at 04/03/2023 [phone] (View report)  Question Answer  If pulseless and not breathing No CPR or chest compressions.  In Pre-Arrest Conditions (Patient Is Breathing and Has A Pulse) Do not intubate. Provide all appropriate non-invasive medical interventions. Avoid ICU transfer unless indicated or required.  Consent: Discussion documented in EHR or advanced directives reviewed      Patient currently has DNR order in place. Discussion with the patient and family regarding wishes.  The DNR order is rescinded during the procedure and the patient consents to the use of any resuscitation procedure needed to treat the clinical events that occur.   History of Present Illness: Robert [name] is a 78 y.o. male  with PMHx of HTN, HLD, PVD, and DMT2.  Patient is currently admitted for UTO with ESBL sepsis.   Per Dr. Assunta Gambles consult note on 2/14: Patient has had pancytopenia with mainly anemia and thrombocytopenia at least since July 2024. Platelets were mostly in the 100 but further drop down to this 60s to 70s in January 2025. Presently with this present hospitalization his platelets are further down to the 20s. I suspect the acute drop in platelets is secondary to underlying sepsis but given his significant anemia and thrombocytopenia which has not been explained by peripheral blood workup done in late January 2025 will plan to proceed with a bone marrow biopsy at this time early next week. Discussed risks and benefits of bone marrow biopsy with the patient and he agrees to proceed.   Interventional Radiology was requested for one  marrow biopsy and aspiration. Patient is scheduled for same in IR tomorrow.  All labs and medications are within acceptable parameters. No pertinent allergies. Patient will be NPO at midnight.    Currently without any significant complaints. Patient alert and sitting up in chair,calm. Denies any fevers, headache, chest pain, SOB, cough, abdominal pain, nausea, vomiting or bleeding.      Past Medical History:  Diagnosis Date   Allergy    Cellulitis    Diabetes mellitus without complication (HCC)    Hyperlipidemia    Hypertension    Peripheral vascular disease [facility])     Past Surgical History:  Procedure Laterality Date   BIOPSY  08/21/2022   Procedure: BIOPSY;  Surgeon: [name], DO;  Location: [facility] ENDOSCOPY;  Service: Gastroenterology;;   COLONOSCOPY WITH PROPOFOL N/A 10/02/2017   Procedure: COLONOSCOPY WITH PROPOFOL;  Surgeon: Wyline Mood, MD;  Location: [facility] ENDOSCOPY;  Service: Gastroenterology;  Laterality: N/A;   COLONOSCOPY WITH PROPOFOL N/A 08/21/2022   Procedure: COLONOSCOPY WITH PROPOFOL;  Surgeon: [name], DO;  Location: [facility] ENDOSCOPY;  Service: Gastroenterology;  Laterality: N/A;   ESOPHAGOGASTRODUODENOSCOPY (EGD) WITH PROPOFOL N/A 08/11/2022   Procedure: ESOPHAGOGASTRODUODENOSCOPY (EGD) WITH PROPOFOL;  Surgeon: Midge Minium, MD;  Location[facility] ENDOSCOPY;  Service: Endoscopy;  Laterality: N/A;   HERNIA REPAIR     PERICARDIOCENTESIS N/A 08/29/2022   Procedure: PERICARDIOCENTESIS;  Surgeon: Yvonne Kendall, MD;  Location: [facility] INVASIVE CV LAB;  Service: Cardiovascular;  Laterality: N/A;   POLYPECTOMY  08/21/2022   Procedure: POLYPECTOMY;  Surgeon: [name], DO;  Location: [facility] ENDOSCOPY;  Service: Gastroenterology;;  Allergies: Patient has no known allergies.  Medications: Prior to Admission medications   Medication Sig Start Date End Date Taking? Authorizing Provider  DULoxetine (CYMBALTA) 60 MG capsule Take 1 capsule (60 mg  total) by mouth 2 (two) times daily. 10/11/21  Yes Masoud, Renda Rolls, MD  ferrous sulfate 325 (65 FE) MG tablet Take 1 tablet (325 mg total) by mouth 2 (two) times daily with a meal. 10/31/22  Yes Arnetha Courser, MD  furosemide (LASIX) 40 MG tablet Take 1 tablet (40 mg total) by mouth daily. 10/31/22  Yes Arnetha Courser, MD  latanoprost (XALATAN) 0.005 % ophthalmic solution Place 1 drop into both eyes at bedtime. 07/06/15  Yes [provider]  meclizine (ANTIVERT) 12.5 MG tablet TAKE 1 TABLET(12.5 MG) BY MOUTH TWICE DAILY AS NEEDED 01/30/22  Yes Masoud, Renda Rolls, MD  melatonin 5 MG TABS Take 5 mg by mouth at bedtime as needed.   Yes [provider]  metoprolol succinate (TOPROL-XL) 50 MG 24 hr tablet Take 1 tablet (50 mg total) by mouth daily. Take with or immediately following a meal. 09/12/22  Yes Gillis Santa, MD  mirtazapine (REMERON) 15 MG tablet Take 15 mg by mouth daily. 03/15/23  Yes [provider]  Multiple Vitamin (QUINTABS) TABS Take 1 tablet by mouth daily. 01/06/23 01/06/24 Yes [provider]  omeprazole (PRILOSEC OTC) 20 MG tablet Take 20 mg by mouth daily.   Yes [provider]  oxybutynin (DITROPAN-XL) 10 MG 24 hr tablet Take 10 mg by mouth every evening.   Yes [provider]  polyethylene glycol (MIRALAX / GLYCOLAX) 17 g packet Take 17 g by mouth daily as needed.   Yes [provider]  senna-docusate (SENOKOT-S) 8.6-50 MG tablet Take 1 tablet by mouth daily as needed for mild constipation.   Yes [provider]  simvastatin (ZOCOR) 20 MG tablet TAKE 1 TABLET BY MOUTH DAILY 04/25/21  Yes Masoud, Renda Rolls, MD  BD PEN NEEDLE NANO 2ND GEN 32G X 4 MM MISC USE 1 PEN NEEDLE EVERY DAY AS DIRECTED 01/21/21   Corky Downs, MD  insulin glargine-yfgn [facility]) 100 UNIT/ML Pen  02/08/23   [provider]     Family History  Problem Relation Age of Onset   Heart disease Mother    Stroke Mother    Heart disease Father    Heart  attack Father     Social History   Socioeconomic History   Marital status: Married    Spouse name: Not on file   Number of children: 0   Years of education: College Gradute   Highest education level: Bachelor's degree (e.g., BA, AB, BS)  Occupational History   Not on file  Tobacco Use   Smoking status: Never   Smokeless tobacco: Never  Vaping Use   Vaping status: Never Used  Substance and Sexual Activity   Alcohol use: Not Currently    Comment: Occasionally   Drug use: No   Sexual activity: Not Currently  Other Topics Concern   Not on file  Social History Narrative   Not on file   Social Drivers of Health   Financial Resource Strain: Low Risk  (12/15/2022)   Received from [facility]   Overall Financial Resource Strain (CARDIA)    Difficulty of Paying Living Expenses: Not hard at all  Food Insecurity: Patient Unable To Answer (04/04/2023)   Hunger Vital Sign    Worried About Running Out of Food in the Last Year: Patient unable to answer  Ran Out of Food in the Last Year: Patient unable to answer  Transportation Needs: Patient Unable To Answer (04/04/2023)   PRAPARE - Transportation    Lack of Transportation (Medical): Patient unable to answer    Lack of Transportation (Non-Medical): Patient unable to answer  Physical Activity: Inactive (10/29/2020)   Exercise Vital Sign    Days of Exercise per Week: 0 days    Minutes of Exercise per Session: 0 min  Stress: No Stress Concern Present (10/29/2020)   [name] of Occupational Health - Occupational Stress Questionnaire    Feeling of Stress : Not at all  Social Connections: Patient Unable To Answer (04/04/2023)   Social Connection and Isolation Panel [NHANES]    Frequency of Communication with Friends and Family: Patient unable to answer    Frequency of Social Gatherings with Friends and Family: Patient unable to answer    Attends Religious Services: Patient unable to answer    Active Member of  Clubs or Organizations: Patient unable to answer    Attends Banker Meetings: Patient unable to answer    Marital Status: Patient unable to answer     Review of Systems: A 12 point ROS discussed and pertinent positives are indicated in the HPI above.  All other systems are negative.  Vital Signs: BP 133/83 (BP Location: Left Arm)   Pulse 88   Temp 98.3 F (36.8 C)   Resp 16   Ht 6\' 2"  (1.88 m)   Wt 210 lb 1.6 oz (95.3 kg)   SpO2 96%   BMI 26.98 kg/m   Advance Care Plan: The advanced care place/surrogate decision maker was discussed at the time of visit and the patient did not wish to discuss or was not able to name a surrogate decision maker or provide an advance care plan.  Physical Exam Vitals reviewed.  Constitutional:      General: He is not in acute distress.    Appearance: Normal appearance. He is obese.  HENT:     Mouth/Throat:     Mouth: Mucous membranes are moist.  Cardiovascular:     Rate and Rhythm: Normal rate.     Pulses: Normal pulses.     Heart sounds: Normal heart sounds. No murmur heard. Pulmonary:     Effort: Pulmonary effort is normal. No respiratory distress.     Breath sounds: Normal breath sounds.  Abdominal:     General: Abdomen is flat.     Palpations: Abdomen is soft.     Tenderness: There is no abdominal tenderness.  Musculoskeletal:        General: Normal range of motion.     Cervical back: Normal range of motion.  Skin:    General: Skin is warm and dry.  Neurological:     Mental Status: He is alert and oriented to person, place, and time.  Psychiatric:        Mood and Affect: Mood normal.        Behavior: Behavior normal.        Thought Content: Thought content normal.        Judgment: Judgment normal.     Imaging: CT RENAL STONE STUDY Result Date: 04/06/2023 CLINICAL DATA:  Flank pain, colon cancer status post partial colectomy EXAM: CT ABDOMEN AND PELVIS WITHOUT CONTRAST TECHNIQUE: Multidetector CT imaging of the  abdomen and pelvis was performed following the standard protocol without IV contrast. RADIATION DOSE REDUCTION: This exam was performed according to the departmental dose-optimization program which includes  automated exposure control, adjustment of the mA and/or kV according to patient size and/or use of iterative reconstruction technique. COMPARISON:  08/18/2022 FINDINGS: Lower chest: Small bilateral pleural effusions with right lower lobe atelectasis or consolidation. Aortic and coronary artery atherosclerosis. Hepatobiliary: 4.6 cm cyst in the left hepatic lobe. No new liver abnormality identified on noncontrast images. Cholelithiasis. No pericholecystic inflammatory changes are evident. No biliary dilatation. Pancreas: Unremarkable. No pancreatic ductal dilatation or surrounding inflammatory changes. Spleen: Normal in size without focal abnormality. Adrenals/Urinary Tract: 4.9 cm mixed attenuation right adrenal gland mass which contains macroscopic fat (previously measured 4.5 cm). Unremarkable left adrenal gland. Severely atrophic left kidney. Rounded 3.7 cm low-attenuation lesion within the interpolar region of the right kidney with possible wall thickening. A simple 2.0 cm cyst was noted at this location on the prior study. No renal stone or hydronephrosis. Tiny stones and or a small amount of debris within the bladder. Urinary bladder otherwise unremarkable. Stomach/Bowel: Small hiatal hernia. Stomach is within normal limits. No dilated loops of bowel. Interval partial sigmoid colon resection. No focal bowel wall thickening or inflammatory changes. Vascular/Lymphatic: Slight progression of retroperitoneal lymphadenopathy. Reference nodes include 1.4 cm retrocaval node (series 2, image 37), previously 0.6 cm. 1.5 cm left external iliac chain node (series 2, image 74), previously 1.3 cm. Aortoiliac atherosclerosis. Reproductive: Prostate is unremarkable. Other: Trace free fluid.  No fluid collections or  pneumoperitoneum. Musculoskeletal: No acute or significant osseous findings. IMPRESSION: 1. Rounded 3.7 cm low-attenuation lesion within the interpolar region of the right kidney with possible wall thickening. A simple 2.0 cm cyst was noted at this location on the prior study. Cystic neoplasm or renal abscess not excluded. Correlation with urinalysis is recommended. Consider follow-up contrast enhanced renal protocol CT of the abdomen. 2. Small bilateral pleural effusions with right lower lobe atelectasis or consolidation. 3. Slight progression of retroperitoneal lymphadenopathy, which may be reactive or metastatic. 4. Cholelithiasis without evidence of acute cholecystitis. 5. Tiny stones and/or a small amount of debris within the bladder. 6. Slight interval enlargement of a 4.9 cm mixed attenuation right adrenal gland mass which contains macroscopic fat. This is favored to represent an adrenal myelolipoma. Follow-up nonemergent adrenal protocol CT or MRI is recommended. 7. Aortic atherosclerosis (ICD10-I70.0). Electronically Signed   By: Duanne Guess D.O.   On: 04/06/2023 15:35   ECHOCARDIOGRAM COMPLETE Result Date: 04/04/2023    ECHOCARDIOGRAM REPORT   Patient Name:   Robert [name] Date of Exam: 04/04/2023 Medical Rec #:  [id number]          Height:       74.0 in Accession #:    [id number]         Weight:       210.1 lb Date of Birth:  Oct 18, 1945         BSA:          2.219 m Patient Age:    47 years           BP:           107/71 mmHg Patient Gender: M                  HR:           130 bpm. Exam Location:  [facility] Procedure: 2D Echo, Cardiac Doppler and Color Doppler (Both Spectral and Color            Flow Doppler were utilized during procedure). Indications:     CHF I50.21  History:  Patient has prior history of Echocardiogram examinations, most                  recent 12/22/2022. HFpEF and CHF, Arrythmias:Tachycardia; Risk                  Factors:Non-Smoker, Hypertension, Diabetes and  Dyslipidemia.  Sonographer:     Dondra Prader RVT RCS Referring Phys:  [id number] Emeline General Diagnosing Phys: Alwyn Pea MD  Sonographer Comments: Elevated HR 130's bpm; Limited and poor windows therefore unable to use definity. IMPRESSIONS  1. Left ventricular ejection fraction, by estimation, is 45 to 50%. The left ventricle has mildly decreased function. The left ventricle has no regional wall motion abnormalities. Left ventricular diastolic parameters were normal.  2. Right ventricular systolic function is normal. The right ventricular size is normal.  3. The mitral valve is normal in structure. No evidence of mitral valve regurgitation.  4. The aortic valve is normal in structure. Aortic valve regurgitation is not visualized. FINDINGS  Left Ventricle: Left ventricular ejection fraction, by estimation, is 45 to 50%. The left ventricle has mildly decreased function. The left ventricle has no regional wall motion abnormalities. Strain imaging was not performed. The left ventricular internal cavity size was normal in size. There is no left ventricular hypertrophy. Left ventricular diastolic parameters were normal. Right Ventricle: The right ventricular size is normal. No increase in right ventricular wall thickness. Right ventricular systolic function is normal. Left Atrium: Left atrial size was normal in size. Right Atrium: Right atrial size was normal in size. Pericardium: There is no evidence of pericardial effusion. Mitral Valve: The mitral valve is normal in structure. No evidence of mitral valve regurgitation. Tricuspid Valve: The tricuspid valve is normal in structure. Tricuspid valve regurgitation is trivial. Aortic Valve: The aortic valve is normal in structure. Aortic valve regurgitation is not visualized. Aortic valve mean gradient measures 3.0 mmHg. Aortic valve peak gradient measures 5.5 mmHg. Aortic valve area, by VTI measures 3.00 cm. Pulmonic Valve: The pulmonic valve was not well visualized.  Pulmonic valve regurgitation is not visualized. Aorta: The ascending aorta was not well visualized. IAS/Shunts: No atrial level shunt detected by color flow Doppler. Additional Comments: 3D imaging was not performed.  LEFT VENTRICLE PLAX 2D LVIDd:         4.60 cm   Diastology LVIDs:         3.50 cm   LV e' medial: 12.30 cm/s LV PW:         1.10 cm LV IVS:        0.90 cm LVOT diam:     2.10 cm LV SV:         43 LV SV Index:   19 LVOT Area:     3.46 cm  RIGHT VENTRICLE             IVC RV S prime:     13.40 cm/s  IVC diam: 1.60 cm TAPSE (M-mode): 1.4 cm LEFT ATRIUM         Index LA diam:    3.20 cm 1.44 cm/m  AORTIC VALVE                    PULMONIC VALVE AV Area (Vmax):    2.95 cm     PV Vmax:       0.82 m/s AV Area (Vmean):   2.91 cm     PV Peak grad:  2.7 mmHg AV Area (VTI):  3.00 cm AV Vmax:           117.00 cm/s AV Vmean:          78.400 cm/s AV VTI:            0.142 m AV Peak Grad:      5.5 mmHg AV Mean Grad:      3.0 mmHg LVOT Vmax:         99.80 cm/s LVOT Vmean:        65.900 cm/s LVOT VTI:          0.123 m LVOT/AV VTI ratio: 0.87  AORTA Ao Root diam: 4.00 cm  SHUNTS Systemic VTI:  0.12 m Systemic Diam: 2.10 cm Alwyn Pea MD Electronically signed by Alwyn Pea MD Signature Date/Time: 04/04/2023/8:30:26 PM    Final    DG Chest 1 View Result Date: 04/04/2023 CLINICAL DATA:  78 year old male with history of congestive heart failure. EXAM: CHEST  1 VIEW COMPARISON:  Chest x-ray 04/03/2023. FINDINGS: Lung volumes are low. There is cephalization of the pulmonary vasculature and slight indistinctness of the interstitial markings suggestive of mild pulmonary edema. Small bilateral pleural effusions. Mild cardiomegaly. The patient is rotated to the left on today's exam, resulting in distortion of the mediastinal contours and reduced diagnostic sensitivity and specificity for mediastinal pathology. Atherosclerotic calcifications are noted in the thoracic aorta. IMPRESSION: 1. The appearance the  chest is most suggestive of congestive heart failure, as above. 2. Aortic atherosclerosis. Electronically Signed   By: Trudie Reed M.D.   On: 04/04/2023 07:02   DG Foot 2 Views Right Result Date: 04/03/2023 CLINICAL DATA:  Osteomyelitis.  Fall. EXAM: RIGHT FOOT - 2 VIEW COMPARISON:  None Available. FINDINGS: Large plantar calcaneal spur. Atherosclerotic vascular calcifications. Degenerative spurring in the midfoot. The MTP joints are hyper extended with flexion of the distal interphalangeal joints suggesting second through fifth hammertoe deformities. As result of this, the phalanges are poorly characterized on the AP projection, with the x-ray projection oriented along or oblique to the long axis of the proximal phalanges. Interphalangeal osteoarthritis with loss of articular space and spurring. No malalignment or well-defined fracture. Type 1 accessory navicular. Spurring of the first digit sesamoids. Mild corticated lateral erosion of the head of the fifth metatarsal. IMPRESSION: 1. No malalignment or well-defined fracture. 2. Large plantar calcaneal spur. 3. Atherosclerotic vascular calcifications. 4. Hammertoe deformities. 5. Osteoarthritis. 6. Mild corticated lateral erosion of the head of the fifth metatarsal. This is nonspecific but could be seen with gout or rheumatoid arthropathy. Electronically Signed   By: Gaylyn Rong M.D.   On: 04/03/2023 10:33   DG Chest Port 1 View Result Date: 04/03/2023 CLINICAL DATA:  78 year old male with history of altered mental status and fall. EXAM: PORTABLE CHEST 1 VIEW COMPARISON:  Chest x-ray 03/17/2023. FINDINGS: Opacity at the right base which may reflect atelectasis and/or consolidation, with superimposed small right pleural effusion. Left lung is clear. No definite left pleural effusion. No pneumothorax. No evidence of pulmonary edema. Heart size appears borderline enlarged. The patient is rotated to the right on today's exam, resulting in distortion  of the mediastinal contours and reduced diagnostic sensitivity and specificity for mediastinal pathology. Atherosclerotic calcifications are noted in the thoracic aorta. IMPRESSION: 1. Atelectasis and/or consolidation in the right lower lobe with small right pleural effusion. 2. Aortic atherosclerosis. Electronically Signed   By: Trudie Reed M.D.   On: 04/03/2023 06:43   CT Head Wo Contrast Result Date: 04/03/2023 CLINICAL DATA:  78 year old male with history of trauma from a fall. Altered mental status. EXAM: CT HEAD WITHOUT CONTRAST TECHNIQUE: Contiguous axial images were obtained from the base of the skull through the vertex without intravenous contrast. RADIATION DOSE REDUCTION: This exam was performed according to the departmental dose-optimization program which includes automated exposure control, adjustment of the mA and/or kV according to patient size and/or use of iterative reconstruction technique. COMPARISON:  Head CT 03/17/2023. FINDINGS: Brain: Mild cerebral atrophy. Patchy and confluent areas of decreased attenuation are noted throughout the deep and periventricular white matter of the cerebral hemispheres bilaterally, compatible with chronic microvascular ischemic disease. No evidence of acute infarction, hemorrhage, hydrocephalus, extra-axial collection or mass lesion/mass effect. Vascular: No hyperdense vessel. Atherosclerotic calcifications are again noted in the internal carotid arteries bilaterally and in the vertebral arteries bilaterally. Skull: Normal. Negative for fracture or focal lesion. Sinuses/Orbits: Multifocal mucosal thickening throughout the paranasal sinuses bilaterally, slightly improved compared to the prior examination with resolution of previously noted air-fluid levels. Other: Large left mastoid effusion, unchanged. IMPRESSION: 1. No evidence of significant acute traumatic injury to the skull or brain. 2. Mild cerebral atrophy with chronic microvascular ischemic changes  in the cerebral white matter, as above. 3. Chronic paranasal sinus disease, slightly improved compared to the prior study. 4. Large left mastoid effusion, unchanged. Electronically Signed   By: Trudie Reed M.D.   On: 04/03/2023 06:41   CT HEAD WO CONTRAST ( ) Result Date: 03/17/2023 CLINICAL DATA:  Headache. EXAM: CT HEAD WITHOUT CONTRAST TECHNIQUE: Contiguous axial images were obtained from the base of the skull through the vertex without intravenous contrast. RADIATION DOSE REDUCTION: This exam was performed according to the departmental dose-optimization program which includes automated exposure control, adjustment of the mA and/or kV according to patient size and/or use of iterative reconstruction technique. COMPARISON:  CT head without contrast 01/08/2023 FINDINGS: Brain: Moderate atrophy and white matter changes are stable. No acute infarct, hemorrhage, or mass lesion is present. Deep brain nuclei are within normal limits. The ventricles are of normal size. No significant extraaxial fluid collection is present. The brainstem and cerebellum are within normal limits. Midline structures are within normal limits. Vascular: Atherosclerotic calcifications are present within the cavernous internal carotid arteries at the dural margin of the right vertebral artery and in the distal left vertebral artery. No hyperdense vessel is present. Skull: Calvarium is intact. No focal lytic or blastic lesions are present. No significant extracranial soft tissue lesion is present. Sinuses/Orbits: Near-total opacification of the right maxillary sinus present. Hyperdensity is noted centrally. A fluid level is present left maxillary sinus. The ethmoid air cells are opacified bilaterally. Fluid levels are present in the frontal sinuses bilaterally and in both sphenoid sinuses. Left middle ear and mastoid effusion is present. No osseous destruction is present. Bilateral lens replacements are noted. Globes and orbits are  otherwise unremarkable. IMPRESSION: 1. No acute intracranial abnormality or significant interval change. 2. Stable atrophy and white matter disease. This likely reflects the sequela of chronic microvascular ischemia. 3. Extensive sinus disease. This could be the etiology of the patient's headache. 4. Left middle ear and mastoid effusion. Electronically Signed   By: Marin Roberts M.D.   On: 03/17/2023 18:55   DG Chest 2 View Result Date: 03/17/2023 CLINICAL DATA:  Cough. EXAM: CHEST - 2 VIEW COMPARISON:  Chest radiograph dated February 06, 2023. FINDINGS: Stable cardiomediastinal silhouette. Aortic atherosclerosis. Slightly increased size of a small right pleural effusion with associated basilar atelectasis. Right basilar scarring. Suspected trace  left pleural effusion, similar to the prior exam. Mild bilateral central perihilar interstitial prominence. No pneumothorax. No acute osseous abnormality. IMPRESSION: 1. Persistent slightly increased size of a small right pleural effusion with right basilar atelectasis and scarring. 2. Suspected trace left pleural effusion, similar to the prior exam. 3. Mild bilateral central perihilar prominence could reflect pulmonary vascular congestion. Electronically Signed   By: Hart Robinsons M.D.   On: 03/17/2023 14:49   US ARTERIAL LOWER EXTREMITY DUPLEX BILATERAL Result Date: 03/14/2023 CLINICAL DATA:  Diminished pulses EXAM: BILATERAL LOWER EXTREMITY ARTERIAL DUPLEX SCAN [place] ABIs TECHNIQUE: Gray-scale sonography as well as color Doppler and duplex ultrasound was performed to evaluate the arteries of both lower extremities including the common, superficial and profunda femoral arteries, popliteal artery and calf arteries. COMPARISON:  None Available. FINDINGS: Right Lower Extremity ABI: 1.1 Inflow: Normal common femoral arterial waveforms and velocities. No evidence of inflow (aortoiliac) disease. Outflow: Normal profunda femoral, superficial femoral and popliteal  arterial waveforms and velocities. No focal elevation of the PSV to suggest stenosis. Runoff: Normal posterior and anterior tibial arterial waveforms and velocities. Vessels are patent to the ankle. Left Lower Extremity ABI: 1.0 Inflow: Normal common femoral arterial waveforms and velocities. No evidence of inflow (aortoiliac) disease. Outflow: Normal profunda femoral, superficial femoral and popliteal arterial waveforms and velocities. No focal elevation of the PSV to suggest stenosis. Runoff: Normal posterior and anterior tibial arterial waveforms and velocities. Vessels are patent to the ankle. IMPRESSION: 1. Normal bilateral resting ankle-brachial indices. 2. Very mild scattered atherosclerotic plaque without hemodynamically significant arterial stenosis or occlusion. Electronically Signed   By: Malachy Moan M.D.   On: 03/14/2023 09:15   US ARTERIAL ABI (SCREENING LOWER EXTREMITY) Result Date: 03/14/2023 CLINICAL DATA:  Diminished pulses EXAM: BILATERAL LOWER EXTREMITY ARTERIAL DUPLEX SCAN [place] ABIs TECHNIQUE: Gray-scale sonography as well as color Doppler and duplex ultrasound was performed to evaluate the arteries of both lower extremities including the common, superficial and profunda femoral arteries, popliteal artery and calf arteries. COMPARISON:  None Available. FINDINGS: Right Lower Extremity ABI: 1.1 Inflow: Normal common femoral arterial waveforms and velocities. No evidence of inflow (aortoiliac) disease. Outflow: Normal profunda femoral, superficial femoral and popliteal arterial waveforms and velocities. No focal elevation of the PSV to suggest stenosis. Runoff: Normal posterior and anterior tibial arterial waveforms and velocities. Vessels are patent to the ankle. Left Lower Extremity ABI: 1.0 Inflow: Normal common femoral arterial waveforms and velocities. No evidence of inflow (aortoiliac) disease. Outflow: Normal profunda femoral, superficial femoral and popliteal arterial waveforms and  velocities. No focal elevation of the PSV to suggest stenosis. Runoff: Normal posterior and anterior tibial arterial waveforms and velocities. Vessels are patent to the ankle. IMPRESSION: 1. Normal bilateral resting ankle-brachial indices. 2. Very mild scattered atherosclerotic plaque without hemodynamically significant arterial stenosis or occlusion. Electronically Signed   By: Malachy Moan M.D.   On: 03/14/2023 09:15    Labs:  CBC: Recent Labs    04/06/23 0451 04/07/23 0619 04/08/23 0801 04/09/23 0951  WBC 10.8* 9.6 9.0 8.3  HGB 7.6* 7.1* 8.2* 7.7*  HCT 25.7* 24.2* 26.8* 25.8*  PLT 32* 26* 23* 25*    COAGS: Recent Labs    08/11/22 0025 08/19/22 0347 10/29/22 1826 03/17/23 1356 04/03/23 0521  INR 1.2 1.3* 1.3* 1.4* 1.5*  APTT 29 32  --   --   --     BMP: Recent Labs    04/06/23 0451 04/07/23 0619 04/08/23 0801 04/09/23 0951  NA 139 138 139  139  K 3.2* 3.5 3.9 4.1  CL 104 106 104 103  CO2 27 27 28 27   GLUCOSE 98 168* 88 94  BUN 46* 41* 39* 36*  CALCIUM 7.6* 7.7* 7.8* 7.9*  CREATININE 2.39* 2.22* 2.02* 1.79*  GFRNONAA 27* 30* 33* 38*    LIVER FUNCTION TESTS: Recent Labs    02/07/23 0431 03/17/23 1356 03/21/23 1014 04/03/23 0521 04/06/23 0451  BILITOT 0.8 0.8 0.6 0.9  --   AST 20 19 22 27   --   ALT 11 10 10 11   --   ALKPHOS 83 69 82 71  --   PROT 8.1 8.0 8.8* 7.1  --   ALBUMIN 2.1* 1.8* 1.8* 1.6* <1.5*    TUMOR MARKERS: No results for input(s): "AFPTM", "CEA", "CA199", "CHROMGRNA" in the last 8760 hours.  Assessment and Plan: Per Dr. Assunta Gambles consult note on 2/14: Patient has had pancytopenia with mainly anemia and thrombocytopenia at least since July 2024. Platelets were mostly in the 100 but further drop down to this 60s to 70s in January 2025. Presently with this present hospitalization his platelets are further down to the 20s. I suspect the acute drop in platelets is secondary to underlying sepsis but given his significant anemia and  thrombocytopenia which has not been explained by peripheral blood workup done in late January 2025 will plan to proceed with a bone marrow biopsy at this time early next week. Discussed risks and benefits of bone marrow biopsy with the patient and he agrees to proceed.    Patient will present for scheduled bone marrow biopsy and aspiration in IR tomorrow.  Risks and benefits of bone marrow biopsy and aspiration was discussed with the patient and/or patient's family including, but not limited to bleeding, infection, damage to adjacent structures or low yield requiring additional tests.  All of the questions were answered and there is agreement to proceed.  Consent signed and in chart.  Thank you for allowing our service to participate in Robert [name] 's care.  Electronically Signed: Sable Feil, PA-C   04/09/2023, 3:23 PM      I spent a total of 40 Minutes in face to face in clinical consultation, greater than 50% of which was counseling/coordinating care for pancytopenia, with consideration for bone marrow biopsy and aspiration.

## 2023-04-08 NOTE — Progress Notes (Signed)
Progress Note   Patient: Robert [name] ZOX:[id number] DOB: 1946/02/15 DOA: 04/03/2023     5 DOS: the patient was seen and examined on 04/08/2023   Brief hospital course: 78 year old man with recently diagnosed sigmoid colon cancer status post sigmoid colon partial colectomy in November 2024, chronic anemia secondary to CKD, CKD stage IIIa, ESBL UTI in November 2024, chronic heart failure with reduced ejection fraction 40%, pericardial effusion status post pericardial window.  Sent in from nursing home for fall hypotension and altered mental status.  Klebsiella pneumoniae growing out of blood cultures.  Patient now on meropenem.  12/13.  Repeat blood cultures drawn 12/14.  Creatinine 2.39, hemoglobin 7.6, white blood cell count 10.8, platelet count 32 12/15.  Hemoglobin down to 7.1 transfuse 1 unit of packed red blood cells.  Platelet count 26.  Creatinine down to 2.22. 12/16.  Creatinine down to 2.02, hemoglobin up to 8.2 after transfusion, platelets still low at 23.  Assessment and Plan: * Severe sepsis (HCC) Present on admission with leukocytosis, tachycardia, hypotension, acute kidney injury acute metabolic encephalopathy.  ESBL klebsiella growing out of blood cultures.  Repeat cultures so far negative for 3 days.  Currently on meropenem.  ID following recommends 2 weeks of IV antibiotics.  ID will reevaluate on Monday.  Acute kidney injury superimposed on CKD (HCC) Acute kidney injury on CKD stage IIIa.  Creatinine peaked at 2.8 and current creatinine 2.02.  Hypokalemia Replaced  Thrombocytopenia (HCC) Oncology ordered a bone marrow biopsy.  Today's platelets 23.  Anemia of chronic disease Hemoglobin up to 8.2 after transfusion of 1 unit of packed red blood cells on 2/15.  Acute metabolic encephalopathy Mental status seems improved  Generalized weakness Physical therapy recommending rehab  Hypomagnesemia 4 g IV magnesium given yesterday  Cyst of right kidney Enlarged  from previous.  Right adrenal mass (HCC) Will need follow-up as outpatient  Myocardial injury Demand ischemia from sepsis  Chronic systolic CHF (congestive heart failure) (HCC) Currently no signs of heart failure.         Subjective: Patient feels fine.  Offers no complaints.  No pain.  Admitted with ESBL sepsis.  Physical Exam: Vitals:   04/08/23 0006 04/08/23 0259 04/08/23 0832 04/08/23 1245  BP: 95/61 118/89 (!) 136/93 119/70  Pulse: (!) 118 (!) 109 (!) 131 83  Resp:   18 16  Temp: 98.3 F (36.8 C) 99.6 F (37.6 C) (!) 97.4 F (36.3 C) 98.2 F (36.8 C)  TempSrc: Axillary     SpO2: 100% 97% 99% 99%  Weight:      Height:       Physical Exam HENT:     Head: Normocephalic.  Eyes:     General: Lids are normal.     Conjunctiva/sclera: Conjunctivae normal.  Cardiovascular:     Rate and Rhythm: Normal rate and regular rhythm.     Heart sounds: Normal heart sounds, S1 normal and S2 normal.  Pulmonary:     Breath sounds: No decreased breath sounds, wheezing, rhonchi or rales.  Abdominal:     Palpations: Abdomen is soft.     Tenderness: There is no abdominal tenderness.  Musculoskeletal:     Right lower leg: Swelling present.     Left lower leg: Swelling present.  Skin:    General: Skin is warm.     Findings: No rash.  Neurological:     Mental Status: He is alert.     Data Reviewed: White blood count 9.0, platelet count 23, hemoglobin  8.2, creatinine 2.02  Family Communication: Left message for wife  Disposition: Status is: Inpatient Remains inpatient appropriate because: Will have bone marrow biopsy tomorrow and ID reevaluation.  Will need 2 weeks of IV antibiotics.  Planned Discharge Destination: Rehab    Time spent: 28 minutes  Author: Alford Highland, MD 04/08/2023 1:03 PM  For on call review [email or web address].

## 2023-04-09 DIAGNOSIS — N179 Acute kidney failure, unspecified: Secondary | ICD-10-CM | POA: Diagnosis not present

## 2023-04-09 DIAGNOSIS — D649 Anemia, unspecified: Secondary | ICD-10-CM

## 2023-04-09 DIAGNOSIS — R7881 Bacteremia: Secondary | ICD-10-CM | POA: Diagnosis not present

## 2023-04-09 DIAGNOSIS — D696 Thrombocytopenia, unspecified: Secondary | ICD-10-CM | POA: Diagnosis not present

## 2023-04-09 DIAGNOSIS — A498 Other bacterial infections of unspecified site: Secondary | ICD-10-CM | POA: Diagnosis not present

## 2023-04-09 DIAGNOSIS — A419 Sepsis, unspecified organism: Secondary | ICD-10-CM | POA: Diagnosis not present

## 2023-04-09 DIAGNOSIS — D638 Anemia in other chronic diseases classified elsewhere: Secondary | ICD-10-CM | POA: Diagnosis not present

## 2023-04-09 DIAGNOSIS — Z1612 Extended spectrum beta lactamase (ESBL) resistance: Secondary | ICD-10-CM | POA: Diagnosis not present

## 2023-04-09 DIAGNOSIS — Z7189 Other specified counseling: Secondary | ICD-10-CM | POA: Diagnosis not present

## 2023-04-09 DIAGNOSIS — R6520 Severe sepsis without septic shock: Secondary | ICD-10-CM | POA: Diagnosis not present

## 2023-04-09 LAB — BASIC METABOLIC PANEL
Anion gap: 9 (ref 5–15)
BUN: 36 mg/dL — ABNORMAL HIGH (ref 8–23)
CO2: 27 mmol/L (ref 22–32)
Calcium: 7.9 mg/dL — ABNORMAL LOW (ref 8.9–10.3)
Chloride: 103 mmol/L (ref 98–111)
Creatinine, Ser: 1.79 mg/dL — ABNORMAL HIGH (ref 0.61–1.24)
GFR, Estimated: 38 mL/min — ABNORMAL LOW (ref >60)
Glucose, Bld: 94 mg/dL (ref 70–99)
Potassium: 4.1 mmol/L (ref 3.5–5.1)
Sodium: 139 mmol/L (ref 135–145)

## 2023-04-09 LAB — CBC WITH DIFFERENTIAL/PLATELET
Abs Immature Granulocytes: 0.41 10*3/uL — ABNORMAL HIGH (ref 0.00–0.07)
Basophils Absolute: 0 10*3/uL (ref 0.0–0.1)
Basophils Relative: 0 %
Eosinophils Absolute: 0 10*3/uL (ref 0.0–0.5)
Eosinophils Relative: 0 %
HCT: 25.8 % — ABNORMAL LOW (ref 39.0–52.0)
Hemoglobin: 7.7 g/dL — ABNORMAL LOW (ref 13.0–17.0)
Immature Granulocytes: 5 %
Lymphocytes Relative: 46 %
Lymphs Abs: 3.8 10*3/uL (ref 0.7–4.0)
MCH: 27.4 pg (ref 26.0–34.0)
MCHC: 29.8 g/dL — ABNORMAL LOW (ref 30.0–36.0)
MCV: 91.8 fL (ref 80.0–100.0)
Monocytes Absolute: 1.2 10*3/uL — ABNORMAL HIGH (ref 0.1–1.0)
Monocytes Relative: 14 %
Neutro Abs: 2.9 10*3/uL (ref 1.7–7.7)
Neutrophils Relative %: 35 %
Platelets: 25 10*3/uL — CL (ref 150–400)
RBC: 2.81 MIL/uL — ABNORMAL LOW (ref 4.22–5.81)
RDW: 22.9 % — ABNORMAL HIGH (ref 11.5–15.5)
Smear Review: NORMAL
WBC: 8.3 10*3/uL (ref 4.0–10.5)
nRBC: 0.4 % — ABNORMAL HIGH (ref 0.0–0.2)

## 2023-04-09 LAB — GLUCOSE, CAPILLARY
Glucose-Capillary: 93 mg/dL (ref 70–99)
Glucose-Capillary: 87 mg/dL (ref 70–99)
Glucose-Capillary: 134 mg/dL — ABNORMAL HIGH (ref 70–99)
Glucose-Capillary: 166 mg/dL — ABNORMAL HIGH (ref 70–99)

## 2023-04-09 MED ORDER — METOPROLOL SUCCINATE ER 50 MG PO TB24
50.0000 mg | ORAL_TABLET | Freq: Two times a day (BID) | ORAL | Status: DC
  Administered 2023-04-09 – 2023-04-12 (×6): 50 mg via ORAL
  Filled 2023-04-09 (×6): qty 1
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
## 2023-04-09 NOTE — Progress Notes (Addendum)
Progress Note   Patient: Robert [name] EAV:[id number] DOB: 04-09-1945 DOA: 04/03/2023     6 DOS: the patient was seen and examined on 04/09/2023   Brief hospital course: 78 year old man with recently diagnosed sigmoid colon cancer status post sigmoid colon partial colectomy in November 2024, chronic anemia secondary to CKD, CKD stage IIIa, ESBL UTI in November 2024, chronic heart failure with reduced ejection fraction 40%, pericardial effusion status post pericardial window.  Sent in from nursing home for fall hypotension and altered mental status.  Klebsiella pneumoniae growing out of blood cultures.  Patient now on meropenem.  12/13.  Repeat blood cultures drawn 12/14.  Creatinine 2.39, hemoglobin 7.6, white blood cell count 10.8, platelet count 32 12/15.  Hemoglobin down to 7.1 transfuse 1 unit of packed red blood cells.  Platelet count 26.  Creatinine down to 2.22. 12/16.  Creatinine down to 2.02, hemoglobin up to 8.2 after transfusion, platelets still low at 23. 12/17.  Creatinine down to 1.79, white blood cell count 8.3, hemoglobin 7.7, platelet count 25  Assessment and Plan: * Severe sepsis (HCC) Present on admission with leukocytosis, tachycardia, hypotension, acute kidney injury acute metabolic encephalopathy.  ESBL klebsiella growing out of blood cultures.  Repeat cultures so far negative for 3 days.  Currently on meropenem.  ID following recommends 2 weeks of IV antibiotics.  ID will reevaluate on Monday and set up plan.  Thrombocytopenia (HCC) Bone marrow biopsy tomorrow.  Today's platelet count 25.  Acute kidney injury superimposed on CKD (HCC) Acute kidney injury on CKD stage IIIa.  Creatinine peaked at 2.8 and current creatinine 1.79.  Hypokalemia Replaced  Anemia of chronic disease Today's hemoglobin 7.7.  Received a unit of blood on 2/15.  Acute metabolic encephalopathy Mental status seems improved  Generalized weakness Physical therapy recommending  rehab  Hypomagnesemia Replaced  Cyst of right kidney Enlarged from previous.  Right adrenal mass (HCC) Will need follow-up as outpatient  Myocardial injury Demand ischemia from sepsis  Chronic systolic CHF (congestive heart failure) (HCC) Currently no signs of heart failure.  Most recent EF 45 to 50%.  Metoprolol switched over to Toprol        Subjective: Patient feels okay.  Offers no complaints.  Admitted with ESBL Klebsiella sepsis.  Physical Exam: Vitals:   04/08/23 2324 04/09/23 0304 04/09/23 0814 04/09/23 1205  BP: 127/72 (!) 129/95 128/84 133/83  Pulse: 87 (!) 123 (!) 110 88  Resp:      Temp: 98.6 F (37 C) 98.9 F (37.2 C) (!) 97.5 F (36.4 C) 98.3 F (36.8 C)  TempSrc:      SpO2: 94% 98% 95% 96%  Weight:      Height:       Physical Exam HENT:     Head: Normocephalic.  Eyes:     General: Lids are normal.     Conjunctiva/sclera: Conjunctivae normal.  Cardiovascular:     Rate and Rhythm: Normal rate and regular rhythm.     Heart sounds: Normal heart sounds, S1 normal and S2 normal.  Pulmonary:     Breath sounds: No decreased breath sounds, wheezing, rhonchi or rales.  Abdominal:     Palpations: Abdomen is soft.     Tenderness: There is no abdominal tenderness.  Musculoskeletal:     Right lower leg: Swelling present.     Left lower leg: Swelling present.  Skin:    General: Skin is warm.     Findings: No rash.  Neurological:     Mental  Status: He is alert.     Data Reviewed: Creatinine 1.79, white blood cell count 8.3, hemoglobin 7.7, platelet count 25 Family Communication: Left message for wife  Disposition: Status is: Inpatient Remains inpatient appropriate because: Bone marrow biopsy scheduled for tomorrow.  Will need 2 weeks of IV antibiotics  Planned Discharge Destination: Skilled nursing facility    Time spent: 28 minutes  Author: Alford Highland, MD 04/09/2023 2:33 PM  For on call review [email or web address].

## 2023-04-09 NOTE — Consult Note (Signed)
Pharmacy Antibiotic Note  ASSESSMENT: 78 y.o. male with PMH including UTI (ESBL K. Pneumo), COPD (3L O2 baseline) is presenting with sepsis/ESBL bacteremia. O2Sats 100% on baseline O2 but he is hypotensive. Regarding ESBL concern, patient presented to ED with UTI in 12/2022 and discharged on cephalexin. Ucx later resulted with 90,000 CFU ESBL K. Pneumo (susceptible to imipenem)  Pharmacy has been consulted to manage meropenem and vancomycin dosing. Vancomycin dc, now on meropenem monotherapy  Today, 04/09/2023 Day #7 meropenem Renal: SCr 1.79 (CrCl 40 ml/min), improved WBC WNL Note: anemia and thrombocytopenia Afebrile 2/11 blood cx: K. Pneumoniae 2/11 Urine cx: NGTD 2/13 repeat Blood cx: NGTD  PLAN: Continue meropenem 1 g IV q12H ID follow up for ESBL bacteremia Monitor renal function to assess for any necessary antibiotic dosing changes.  Patient measurements: Height: 6\' 2"  (188 cm) Weight: 95.3 kg (210 lb 1.6 oz) IBW/kg (Calculated) : 82.2  Vital signs: Temp: 97.5 F (36.4 C) (02/17 0814) BP: 128/84 (02/17 0814) Pulse Rate: 110 (02/17 0814) Recent Labs  Lab 04/06/23 0451 04/07/23 0619 04/08/23 0801  WBC 10.8* 9.6 9.0  CREATININE 2.39* 2.22* 2.02*   Estimated Creatinine Clearance: 35 mL/min (A) (by C-G formula based on SCr of 2.02 mg/dL (H)).  Allergies: No Known Allergies  Antimicrobials this admission: Cefepime, metronidazole 2/11 x 1 Vancomycin 2/11 x 1 Meropenem 2/11 >>  Juliette Alcide, PharmD, BCPS, BCIDP Work Cell: [phone] 04/09/2023 10:27 AM

## 2023-04-09 NOTE — Progress Notes (Signed)
Spoke with IR and bone marrow biopsy will be tomorrow at 9am. Diet ordered and NPO at midnight order placed for the AM. Pt and Dr Renae Gloss informed of the same.

## 2023-04-09 NOTE — Progress Notes (Signed)
Physical Therapy Treatment Patient Details Name: Robert [name] MRN: [id number] DOB: 23-Oct-1945 Today's Date: 04/09/2023   History of Present Illness 78 y/o male presented to ER secondary to fall, hypotension and AMS; admitted for management of severe sepsis with acute encephalopathy secondary to ESBL klebsiella bacteremia.    PT Comments  Pt pleasant and motivated t/o the session but is truly very weak and struggled with any sustained standing activity.  Even with heavy UE use on walker, cuing (pre/during) and assist he could only maintain standing for a few seconds at a time for transfer to recliner as well as multiple brief but effortful standing efforts.  Pt's HR much better than last session, though still >100 during any effort. Pt with significant buckling after initial assist to standing, struggled to engage quads consistently despite much cuing and assist.  Pt will benefit from ongoing PT, continue with POC.     If plan is discharge home, recommend the following: Two people to help with walking and/or transfers;Two people to help with bathing/dressing/bathroom   Can travel by private vehicle     No  Equipment Recommendations       Recommendations for Other Services       Precautions / Restrictions Precautions Precautions: Fall Restrictions Weight Bearing Restrictions Per Provider Order: No     Mobility  Bed Mobility Overal bed mobility: Needs Assistance Bed Mobility: Supine to Sit     Supine to sit: Mod assist     General bed mobility comments: Pt very motivated to work with PT and get up to sitting, good effort but needing assist    Transfers Overall transfer level: Needs assistance Equipment used: Rolling walker (2 wheels) Transfers: Sit to/from Stand Sit to Stand: From elevated surface, Mod assist, Max assist           General transfer comment: multiple standing efforts from multiple surfaces (4 seperate bouts).  Pt with poor quad strength and needing  significant assist with each attempt.  Pt struggled with maintaining standing more than a few seconds, especially during attempts at stepping/weight shifting    Ambulation/Gait               General Gait Details: unable/unsafe   Stairs             Wheelchair Mobility     Tilt Bed    Modified Rankin (Stroke Patients Only)       Balance Overall balance assessment: Needs assistance, History of Falls Sitting-balance support: Bilateral upper extremity supported, Feet supported Sitting balance-[name]: Fair       Standing balance-[name]: Zero Standing balance comment: Pt very quick to fatigue/have knees buckle (despite assist and heavy cuing) during minimal standing efforts                            Communication Communication Communication: No apparent difficulties  Cognition Arousal: Alert Behavior During Therapy: WFL for tasks assessed/performed   PT - Cognitive impairments: No apparent impairments                         Following commands: Intact      Cueing    Exercises      General Comments General comments (skin integrity, edema, etc.): Pt's HR on arrival in mid 90s, appeared to stayed <110 bpm during activity      Pertinent Vitals/Pain Pain Assessment Pain Assessment: No/denies pain    Home  Living                          Prior Function            PT Goals (current goals can now be found in the care plan section) Progress towards PT goals: Progressing toward goals    Frequency    Min 1X/week      PT Plan      Co-evaluation              AM-PAC PT "6 Clicks" Mobility   Outcome Measure  Help needed turning from your back to your side while in a flat bed without using bedrails?: A Little Help needed moving from lying on your back to sitting on the side of a flat bed without using bedrails?: A Lot Help needed moving to and from a bed to a chair (including a wheelchair)?:  Total Help needed standing up from a chair using your arms (e.g., wheelchair or bedside chair)?: Total Help needed to walk in hospital room?: Total Help needed climbing 3-5 steps with a railing? : Total 6 Click Score: 9    End of Session Equipment Utilized During Treatment: Gait belt Activity Tolerance: Treatment limited secondary to medical complications (Comment) Patient left: in chair;with chair alarm set;with call bell/phone within reach Nurse Communication: Mobility status (need  +2 assist for transfers) PT Visit Diagnosis: Muscle weakness (generalized) (M62.81);Difficulty in walking, not elsewhere classified (R26.2)     Time: 4696-2952 PT Time Calculation (min) (ACUTE ONLY): 28 min  Charges:    $Therapeutic Activity: 8-22 mins PT General Charges $$ ACUTE PT VISIT: 1 Visit                     Malachi Pro, DPT 04/09/2023, 11:58 AM

## 2023-04-09 NOTE — TOC Progression Note (Signed)
Transition of Care [facility]) - Progression Note    Patient Details  Name: Robert [name] MRN: [id number] Date of Birth: 02-05-1946  Transition of [facility]) CM/SW Contact  Truddie Hidden, RN Phone Number: 04/09/2023, 10:03 AM  Clinical Narrative:    TOC continuing to follow patient's progress throughout discharge planning.        Expected Discharge Plan and Services                                               Social Determinants of Health (SDOH) Interventions SDOH Screenings   Food Insecurity: Patient Unable To Answer (04/04/2023)  Housing: Patient Unable To Answer (04/04/2023)  Transportation Needs: Patient Unable To Answer (04/04/2023)  Utilities: Patient Unable To Answer (04/04/2023)  Alcohol Screen: Low Risk  (10/29/2020)  Depression (PHQ2-9): Low Risk  (09/12/2021)  Financial Resource Strain: Low Risk  (12/15/2022)   Received from [facility]  Physical Activity: Inactive (10/29/2020)  Social Connections: Patient Unable To Answer (04/04/2023)  Stress: No Stress Concern Present (10/29/2020)  Tobacco Use: Low Risk  (04/04/2023)    Readmission Risk Interventions    04/04/2023   12:56 PM 02/07/2023   12:33 PM  Readmission Risk Prevention Plan  Transportation Screening Complete Complete  Medication Review (RN Care Manager) Complete Complete  PCP or Specialist appointment within 3-5 days of discharge Complete Complete  SW Recovery Care/Counseling Consult Complete Complete  Palliative Care Screening Not Applicable Not Applicable  Skilled Nursing Facility Complete Complete

## 2023-04-10 ENCOUNTER — Inpatient Hospital Stay: Payer: Medicare Other | Admitting: Radiology

## 2023-04-10 ENCOUNTER — Encounter: Payer: Self-pay | Admitting: Internal Medicine

## 2023-04-10 DIAGNOSIS — Z7189 Other specified counseling: Secondary | ICD-10-CM | POA: Diagnosis not present

## 2023-04-10 DIAGNOSIS — A419 Sepsis, unspecified organism: Secondary | ICD-10-CM | POA: Diagnosis not present

## 2023-04-10 DIAGNOSIS — D696 Thrombocytopenia, unspecified: Secondary | ICD-10-CM | POA: Diagnosis not present

## 2023-04-10 DIAGNOSIS — N179 Acute kidney failure, unspecified: Secondary | ICD-10-CM | POA: Diagnosis not present

## 2023-04-10 DIAGNOSIS — E876 Hypokalemia: Secondary | ICD-10-CM | POA: Diagnosis not present

## 2023-04-10 DIAGNOSIS — R6520 Severe sepsis without septic shock: Secondary | ICD-10-CM | POA: Diagnosis not present

## 2023-04-10 HISTORY — PX: IR BONE MARROW BIOPSY & ASPIRATION: IMG5727

## 2023-04-10 LAB — CULTURE, BLOOD (ROUTINE X 2)
Culture: NO GROWTH
Culture: NO GROWTH
Special Requests: ADEQUATE

## 2023-04-10 LAB — BASIC METABOLIC PANEL
Anion gap: 8 (ref 5–15)
BUN: 36 mg/dL — ABNORMAL HIGH (ref 8–23)
CO2: 26 mmol/L (ref 22–32)
Calcium: 7.9 mg/dL — ABNORMAL LOW (ref 8.9–10.3)
Chloride: 104 mmol/L (ref 98–111)
Creatinine, Ser: 1.73 mg/dL — ABNORMAL HIGH (ref 0.61–1.24)
GFR, Estimated: 40 mL/min — ABNORMAL LOW (ref >60)
Glucose, Bld: 98 mg/dL (ref 70–99)
Potassium: 3.4 mmol/L — ABNORMAL LOW (ref 3.5–5.1)
Sodium: 138 mmol/L (ref 135–145)

## 2023-04-10 LAB — CBC WITH DIFFERENTIAL/PLATELET
Abs Immature Granulocytes: 0.41 10*3/uL — ABNORMAL HIGH (ref 0.00–0.07)
Basophils Absolute: 0 10*3/uL (ref 0.0–0.1)
Basophils Relative: 0 %
Eosinophils Absolute: 0 10*3/uL (ref 0.0–0.5)
Eosinophils Relative: 0 %
HCT: 24.2 % — ABNORMAL LOW (ref 39.0–52.0)
Hemoglobin: 7.2 g/dL — ABNORMAL LOW (ref 13.0–17.0)
Immature Granulocytes: 5 %
Lymphocytes Relative: 51 %
Lymphs Abs: 4.3 10*3/uL — ABNORMAL HIGH (ref 0.7–4.0)
MCH: 27.4 pg (ref 26.0–34.0)
MCHC: 29.8 g/dL — ABNORMAL LOW (ref 30.0–36.0)
MCV: 92 fL (ref 80.0–100.0)
Monocytes Absolute: 1.4 10*3/uL — ABNORMAL HIGH (ref 0.1–1.0)
Monocytes Relative: 17 %
Neutro Abs: 2.3 10*3/uL (ref 1.7–7.7)
Neutrophils Relative %: 27 %
Platelets: 32 10*3/uL — ABNORMAL LOW (ref 150–400)
RBC: 2.63 MIL/uL — ABNORMAL LOW (ref 4.22–5.81)
RDW: 22.3 % — ABNORMAL HIGH (ref 11.5–15.5)
Smear Review: NORMAL
WBC: 8.5 10*3/uL (ref 4.0–10.5)
nRBC: 0.2 % (ref 0.0–0.2)

## 2023-04-10 LAB — GLUCOSE, CAPILLARY
Glucose-Capillary: 90 mg/dL (ref 70–99)
Glucose-Capillary: 125 mg/dL — ABNORMAL HIGH (ref 70–99)
Glucose-Capillary: 111 mg/dL — ABNORMAL HIGH (ref 70–99)

## 2023-04-10 LAB — PREPARE RBC (CROSSMATCH)

## 2023-04-10 LAB — MAGNESIUM: Magnesium: 1.7 mg/dL (ref 1.7–2.4)

## 2023-04-10 LAB — PROTIME-INR
INR: 1.4 — ABNORMAL HIGH (ref 0.8–1.2)
Prothrombin Time: 17.3 s — ABNORMAL HIGH (ref 11.4–15.2)

## 2023-04-10 MED ORDER — FENTANYL CITRATE (PF) 100 MCG/2ML IJ SOLN
INTRAMUSCULAR | Status: AC
  Filled 2023-04-10: qty 2

## 2023-04-10 MED ORDER — POTASSIUM CHLORIDE CRYS ER 20 MEQ PO TBCR
40.0000 meq | EXTENDED_RELEASE_TABLET | Freq: Once | ORAL | Status: AC
  Administered 2023-04-10: 40 meq via ORAL
  Filled 2023-04-10: qty 2

## 2023-04-10 MED ORDER — MAGNESIUM OXIDE -MG SUPPLEMENT 400 (240 MG) MG PO TABS
400.0000 mg | ORAL_TABLET | Freq: Once | ORAL | Status: AC
  Administered 2023-04-10: 400 mg via ORAL
  Filled 2023-04-10: qty 1

## 2023-04-10 MED ORDER — SODIUM CHLORIDE 0.9% IV SOLUTION: Freq: Once | INTRAVENOUS | Status: AC

## 2023-04-10 MED ORDER — SODIUM CHLORIDE 0.9 % IV SOLN
1.0000 g | INTRAVENOUS | Status: DC
  Administered 2023-04-11 – 2023-04-12 (×2): 1 g via INTRAVENOUS
  Filled 2023-04-10 (×5): qty 1000

## 2023-04-10 MED ORDER — MIDAZOLAM HCL 2 MG/2ML IJ SOLN
INTRAMUSCULAR | Status: AC
  Filled 2023-04-10: qty 2

## 2023-04-10 MED ORDER — FENTANYL CITRATE (PF) 100 MCG/2ML IJ SOLN
INTRAMUSCULAR | Status: AC | PRN
  Administered 2023-04-10: 50 ug via INTRAVENOUS

## 2023-04-10 MED ORDER — SODIUM CHLORIDE 0.9% FLUSH: 10.0000 mL | INTRAVENOUS | Status: DC | PRN

## 2023-04-10 MED ORDER — LIDOCAINE 1 % OPTIME INJ - NO CHARGE
9.0000 mL | Freq: Once | INTRAMUSCULAR | Status: AC
  Administered 2023-04-10: 9 mL via INTRADERMAL

## 2023-04-10 MED ORDER — FUROSEMIDE 10 MG/ML IJ SOLN
40.0000 mg | Freq: Once | INTRAMUSCULAR | Status: AC
  Administered 2023-04-10: 40 mg via INTRAVENOUS
  Filled 2023-04-10: qty 4

## 2023-04-10 MED ORDER — HEPARIN SOD (PORK) LOCK FLUSH 100 UNIT/ML IV SOLN
INTRAVENOUS | Status: AC
  Filled 2023-04-10: qty 5

## 2023-04-10 MED ORDER — SODIUM CHLORIDE 0.9% FLUSH
10.0000 mL | Freq: Two times a day (BID) | INTRAVENOUS | Status: DC
Start: 2023-04-10 — End: 2023-04-13
  Administered 2023-04-10 – 2023-04-12 (×4): 10 mL

## 2023-04-10 MED ORDER — MIDAZOLAM HCL 2 MG/2ML IJ SOLN
INTRAMUSCULAR | Status: AC | PRN
  Administered 2023-04-10: 1 mg via INTRAVENOUS

## 2023-04-10 NOTE — Progress Notes (Signed)
Patient clinically stable post IR BMB per Dr Archer Asa, tolerated well. Vitals stable pre and post procedure. Received Versed 1 mg along with Fentanyl 50 mcg IV for procedure. Report given to Mar Daring RN post procedure/specials/11

## 2023-04-10 NOTE — Treatment Plan (Signed)
Diagnosis: Esbl klebsiella bacteremia Baseline Creatinine 1.73    No Known Allergies  OPAT Orders Discharge antibiotics: Ertapenem 1 gram Iv every 24 hours  Duration: 2 weeks total End Date: 04/17/23  [facility] Care Per Protocol:  Labs weekly while on IV antibiotics: _X_ CBC with differential  _X_ CMP   _X_ Please pull midline at completion of IV antibiotics __ Please leave PIC in place until doctor has seen patient or been notified  Fax weekly lab results  promptly to [phone]  Clinic Follow Up Appt:is not needed with Dr.Anthonymichael Munday   Call [phone] with any critical value or questions

## 2023-04-10 NOTE — Progress Notes (Signed)
OT Cancellation Note  Patient Details Name: Robert [name] MRN: [id number] DOB: Jan 18, 1946   Cancelled Treatment:    Reason Eval/Treat Not Completed: Other (comment). Pt currently receiving blood transfusion. Will re-attempt OT tx at later date/time.   Arman Filter., MPH, MS, OTR/L ascom [phone] 04/10/23, 3:46 PM

## 2023-04-10 NOTE — Plan of Care (Signed)

## 2023-04-10 NOTE — Progress Notes (Signed)
PHARMACY CONSULT NOTE FOR:  OUTPATIENT  PARENTERAL ANTIBIOTIC THERAPY (OPAT)  Indication: ESBL Klebsiella bacteremia Regimen: Ertapenem 1gm IV q24h End date: 04/17/2023  -Labs - Once weekly:  CBC/D and CMP -Fax weekly lab results  promptly to [phone] -Please pull midline at completion of IV antibiotics -Call [phone] with any critical value or questions   IV antibiotic discharge orders are pended. To discharging provider:  please sign these orders via discharge navigator,  Select New Orders & click on the button choice - Manage This Unsigned Work.     Thank you for allowing pharmacy to be a part of this patient's care.  Juliette Alcide, PharmD, BCPS, BCIDP Work Cell: [phone] 04/10/2023 4:47 PM

## 2023-04-10 NOTE — Progress Notes (Signed)
Progress Note   Patient: Robert [name] MVH:[id number] DOB: [birth year] DOA: 04/03/2023     7 DOS: the patient was seen and examined on 04/10/2023   Brief hospital course: 78 year old man with recently diagnosed sigmoid colon cancer status post sigmoid colon partial colectomy in November 2024, chronic anemia secondary to CKD, CKD stage IIIa, ESBL UTI in November 2024, chronic heart failure with reduced ejection fraction 40%, pericardial effusion status post pericardial window.  Sent in from nursing home for fall hypotension and altered mental status.  Klebsiella pneumoniae growing out of blood cultures.  Patient now on meropenem.  12/13.  Repeat blood cultures drawn 12/14.  Creatinine 2.39, hemoglobin 7.6, white blood cell count 10.8, platelet count 32 12/15.  Hemoglobin down to 7.1 transfuse 1 unit of packed red blood cells.  Platelet count 26.  Creatinine down to 2.22. 12/16.  Creatinine down to 2.02, hemoglobin up to 8.2 after transfusion, platelets still low at 23. 12/17.  Creatinine down to 1.79, white blood cell count 8.3, hemoglobin 7.7, platelet count 25 12/18.  Creatinine 1.73, hemoglobin 7.2 and platelet count 32.  Patient had bone marrow biopsy today.  Will transfuse 1 unit of packed red blood cells today.  Case discussed with oncology and they will follow-up as outpatient and okay to go out to rehab with low platelets.  Case discussed with ID and OPAT orders were placed for IV antibiotics back at facility.  Assessment and Plan: * Severe sepsis (HCC) Present on admission with leukocytosis, tachycardia, hypotension, acute kidney injury acute metabolic encephalopathy.  ESBL klebsiella growing out of blood cultures.  Repeat cultures so far negative for 5 days.  Currently on meropenem.  ID following recommends 2 weeks total of IV antibiotics.  Midline placed.  OPAT orders placed by ID.  Hopefully can get back to facility tomorrow to complete antibiotic course.  Thrombocytopenia  (HCC) Bone marrow biopsy today.  Today's platelet count 32.  Case discussed with Dr. Smith Robert and she is okay getting back to facility with the low platelets.  She will follow-up patient with appointment on 2/24.  Acute kidney injury superimposed on CKD (HCC) Acute kidney injury on CKD stage IIIa.  Creatinine peaked at 2.8 and current creatinine 1.73.  Hypokalemia Replace orally today  Anemia of chronic disease Today's hemoglobin 7.2.  Will give a unit of blood on 2/18 and recheck CBC tomorrow.  Patient also received a unit of blood on 2/15.  Acute metabolic encephalopathy Mental status improved from admission  Generalized weakness Physical therapy recommending rehab  Hypomagnesemia Replace orally today.  Cyst of right kidney Enlarged from previous.  Right adrenal mass [facility]) Will need follow-up as outpatient with urology  Myocardial injury Demand ischemia from sepsis  Chronic systolic CHF (congestive heart failure) (HCC) Currently no signs of heart failure.  Most recent EF 45 to 50%.  Metoprolol switched over to Toprol.  Will give a dose of IV Lasix with blood today.  Can consider restarting low-dose Lasix upon discharge.        Subjective: Patient seen before bone marrow biopsy this morning.  Hemoglobin down to 7.2 and we will transfuse a unit of blood today.  Admitted with ESBL Klebsiella sepsis.  Physical Exam: Vitals:   04/10/23 1310 04/10/23 1325 04/10/23 1538 04/10/23 1737  BP: 128/82 130/72 136/76 (P) 138/76  Pulse: 96 92 84 (P) 86  Resp: 18 18 18  (P) 19  Temp: 97.9 F (36.6 C) 98.9 F (37.2 C) 98.8 F (37.1 C) (P) 98.6 F (37  C)  TempSrc: Oral Oral  (P) Oral  SpO2: 99% 98% 95% (P) 95%  Weight:      Height:       Physical Exam HENT:     Head: Normocephalic.  Eyes:     General: Lids are normal.     Conjunctiva/sclera: Conjunctivae normal.  Cardiovascular:     Rate and Rhythm: Normal rate and regular rhythm.     Heart sounds: Normal heart sounds, S1  normal and S2 normal.  Pulmonary:     Breath sounds: No decreased breath sounds, wheezing, rhonchi or rales.  Abdominal:     Palpations: Abdomen is soft.     Tenderness: There is no abdominal tenderness.  Musculoskeletal:     Right lower leg: Swelling present.     Left lower leg: Swelling present.  Skin:    General: Skin is warm.     Findings: No rash.     Comments: Bruising on arms.  Neurological:     Mental Status: He is alert.     Data Reviewed: Platelet count 32, hemoglobin 7.2, white blood cell count 8.5, magnesium 1.7, potassium 3.4, creatinine 1.73.  Family Communication: Updated patient's wife on the phone  Disposition: Status is: Inpatient Remains inpatient appropriate because: Patient had bone marrow biopsy today.  Will give a unit of blood for hemoglobin of 7.2.  Planned Discharge Destination: Rehab    Time spent: 28 minutes  Author: Alford Highland, MD 04/10/2023 5:48 PM  For on call review [email or web address].

## 2023-04-10 NOTE — Procedures (Signed)
 Interventional Radiology Procedure Note  Procedure: CT guided aspirate and core biopsy of left iliac bone Complications: None Recommendations: - Bedrest supine x 1 hrs - Hydrocodone PRN  Pain - Follow biopsy results  Signed,  Sterling Big, MD
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
## 2023-04-11 DIAGNOSIS — A419 Sepsis, unspecified organism: Secondary | ICD-10-CM | POA: Diagnosis not present

## 2023-04-11 DIAGNOSIS — R6520 Severe sepsis without septic shock: Secondary | ICD-10-CM | POA: Diagnosis not present

## 2023-04-11 DIAGNOSIS — Z7189 Other specified counseling: Secondary | ICD-10-CM | POA: Diagnosis not present

## 2023-04-11 DIAGNOSIS — Z515 Encounter for palliative care: Secondary | ICD-10-CM

## 2023-04-11 LAB — CBC
HCT: 27.6 % — ABNORMAL LOW (ref 39.0–52.0)
Hemoglobin: 8.6 g/dL — ABNORMAL LOW (ref 13.0–17.0)
MCH: 27.9 pg (ref 26.0–34.0)
MCHC: 31.2 g/dL (ref 30.0–36.0)
MCV: 89.6 fL (ref 80.0–100.0)
Platelets: 35 10*3/uL — ABNORMAL LOW (ref 150–400)
RBC: 3.08 MIL/uL — ABNORMAL LOW (ref 4.22–5.81)
RDW: 21.1 % — ABNORMAL HIGH (ref 11.5–15.5)
WBC: 7.7 10*3/uL (ref 4.0–10.5)
nRBC: 0.3 % — ABNORMAL HIGH (ref 0.0–0.2)

## 2023-04-11 LAB — TYPE AND SCREEN
ABO/RH(D): AB POS
Antibody Screen: NEGATIVE
Unit division: 0
Unit division: 0

## 2023-04-11 LAB — BASIC METABOLIC PANEL
Anion gap: 8 (ref 5–15)
BUN: 33 mg/dL — ABNORMAL HIGH (ref 8–23)
CO2: 27 mmol/L (ref 22–32)
Calcium: 8.2 mg/dL — ABNORMAL LOW (ref 8.9–10.3)
Chloride: 104 mmol/L (ref 98–111)
Creatinine, Ser: 1.73 mg/dL — ABNORMAL HIGH (ref 0.61–1.24)
GFR, Estimated: 40 mL/min — ABNORMAL LOW (ref >60)
Glucose, Bld: 97 mg/dL (ref 70–99)
Potassium: 4 mmol/L (ref 3.5–5.1)
Sodium: 139 mmol/L (ref 135–145)

## 2023-04-11 LAB — BPAM RBC
Blood Product Expiration Date: 202502192359
Blood Product Expiration Date: 202503102359
ISSUE DATE / TIME: 202502151240
ISSUE DATE / TIME: 202502181316
Unit Type and Rh: 5100
Unit Type and Rh: 9500

## 2023-04-11 LAB — GLUCOSE, CAPILLARY
Glucose-Capillary: 100 mg/dL — ABNORMAL HIGH (ref 70–99)
Glucose-Capillary: 139 mg/dL — ABNORMAL HIGH (ref 70–99)
Glucose-Capillary: 131 mg/dL — ABNORMAL HIGH (ref 70–99)
Glucose-Capillary: 86 mg/dL (ref 70–99)

## 2023-04-11 NOTE — Progress Notes (Signed)
Physical Therapy Treatment Patient Details Name: Robert [name] MRN: [id number] DOB: Jul 16, 1945 Today's Date: 04/11/2023   History of Present Illness 78 y/o male presented to ER secondary to fall, hypotension and AMS; admitted for management of severe sepsis with acute encephalopathy secondary to ESBL klebsiella bacteremia.    PT Comments  Pt was long sitting in bed upon arrival. At first, refused, but with encouragement agrees to PT/OT co-treat. Pt very appreciative by end of session for getting OOB. Pt was able to achieve EOB sitting without physical assistance. He requires extensive +2 assistance to stand EOB however by 4th STS was able to take a few lateral side steps towards HOB. Acute PT will continue to follow per current POC. DC recs remain appropriate.    If plan is discharge home, recommend the following: Two people to help with walking and/or transfers;Two people to help with bathing/dressing/bathroom     Equipment Recommendations  Other (comment) (Defer to next level of care)       Precautions / Restrictions Precautions Precautions: Fall Restrictions Weight Bearing Restrictions Per Provider Order: No     Mobility  Bed Mobility Overal bed mobility: Needs Assistance Bed Mobility: Supine to Sit, Sit to Supine  Supine to sit: Supervision, HOB elevated, Used rails Sit to supine: Supervision, Used rails   Transfers Overall transfer level: Needs assistance Equipment used: Rolling walker (2 wheels) Transfers: Sit to/from Stand Sit to Stand: From elevated surface, Max assist, +2 physical assistance  General transfer comment: Max A x2 for 4 STS trials from EOB with bed height elevated and max cueing for hand/feet placement, anterior weight shift; pt very fearful with posterior bias; able to take a few quick lateral steps prior to returning to bed    Ambulation/Gait Ambulation/Gait assistance: +2 safety/equipment, +2 physical assistance Gait Distance (Feet): 3  Feet Assistive device: Rolling walker (2 wheels) Gait Pattern/deviations: Step-to pattern Gait velocity: decreased  General Gait Details: pt was able to take side steps along EOB. overall pt needs vcs for erecting posture and keeping wt fwd. pt tends to have posterior LOB but with encouraged and cueing is able to correct. fear of falling most limiting     Balance Overall balance assessment: Needs assistance, History of Falls Sitting-balance support: Bilateral upper extremity supported, Feet supported Sitting balance-[name]: Fair Sitting balance - Comments: no LOB while seated EOB today     Standing balance-[name]: Zero Standing balance comment: Max A x2 and RW use to stand for short periods of time with posterior lean and heavy cueing      Communication Communication Communication: No apparent difficulties  Cognition Arousal: Alert Behavior During Therapy: WFL for tasks assessed/performed, Anxious (fear of falling present)   PT - Cognitive impairments: No apparent impairments      PT - Cognition Comments: slow processing but oriented and cooperative. fearful of falling with all OOB/standing attempts Following commands: Intact            General Comments General comments (skin integrity, edema, etc.): HR 89-97 throughout session      Pertinent Vitals/Pain Pain Assessment Faces Pain Scale: No hurt     PT Goals (current goals can now be found in the care plan section) Acute Rehab PT Goals Patient Stated Goal: rehab at home Progress towards PT goals: Not progressing toward goals - comment    Frequency    Min 1X/week           Co-evaluation   Reason for Co-Treatment: Complexity of the patient's  impairments (multi-system involvement) PT goals addressed during session: Mobility/safety with mobility OT goals addressed during session: ADL's and self-care      AM-PAC PT "6 Clicks" Mobility   Outcome Measure  Help needed turning from your back to your  side while in a flat bed without using bedrails?: A Little Help needed moving from lying on your back to sitting on the side of a flat bed without using bedrails?: A Little Help needed moving to and from a bed to a chair (including a wheelchair)?: Total Help needed standing up from a chair using your arms (e.g., wheelchair or bedside chair)?: Total Help needed to walk in hospital room?: Total Help needed climbing 3-5 steps with a railing? : Total 6 Click Score: 10    End of Session   Activity Tolerance: Patient tolerated treatment well Patient left: in bed;with call bell/phone within reach;with bed alarm set Nurse Communication: Mobility status PT Visit Diagnosis: Muscle weakness (generalized) (M62.81);Difficulty in walking, not elsewhere classified (R26.2)     Time: 6578-4696 PT Time Calculation (min) (ACUTE ONLY): 30 min  Charges:    $Therapeutic Activity: 8-22 mins PT General Charges $$ ACUTE PT VISIT: 1 Visit                     Jetta Lout PTA 04/11/23, 4:07 PM

## 2023-04-11 NOTE — Progress Notes (Signed)
Occupational Therapy Treatment Patient Details Name: Robert [name] MRN: [id number] DOB: 07/04/1945 Today's Date: 04/11/2023   History of present illness 78 y/o male presented to ER secondary to fall, hypotension and AMS; admitted for management of severe sepsis with acute encephalopathy secondary to ESBL klebsiella bacteremia.   OT comments  Pt is supine in bed on arrival. Pleasant and agreeable to PT/OT co-tx session to maximize pt/therapist safety. He did not report pain during session, but noted to be fearful of falling. He needed heavy cueing and encouragement to attempt standing. Max A x2 for 4 standing trials of short periods with heavy cueing for hand/feet placement, posterior bias and difficulty with anterior weight shift. Bed height elevated significantly to assist. Pt mostly W/C bound and SPT level at home per report during session. HR 89-97 throughout. Set up assist to wash face at bedside and Mod A for LB dressing to don socks at bed level via figure four. He took a few quick lateral steps to [facility] with Max A x2 and RW use prior to return to bed. Remains very weak and self limiting d/t fear, but very thankful of therapists for encouraging him.  Pt returned to bed with all needs in place and will cont to require skilled acute OT services to maximize his safety and IND to return to PLOF.       If plan is discharge home, recommend the following:  Two people to help with bathing/dressing/bathroom;Two people to help with walking and/or transfers   Equipment Recommendations  Other (comment) (defer)    Recommendations for Other Services      Precautions / Restrictions Precautions Precautions: Fall Restrictions Weight Bearing Restrictions Per Provider Order: No       Mobility Bed Mobility Overal bed mobility: Needs Assistance Bed Mobility: Supine to Sit, Sit to Supine     Supine to sit: Supervision, HOB elevated, Used rails Sit to supine: Supervision, Used rails   General  bed mobility comments: SUP for bed mobility with no physical assist needed    Transfers Overall transfer level: Needs assistance Equipment used: Rolling walker (2 wheels) Transfers: Sit to/from Stand Sit to Stand: From elevated surface, Max assist, +2 physical assistance           General transfer comment: Max A x2 for 4 STS trials from EOB with bed height elevated and max cueing for hand/feet placement, anterior weight shift; pt very fearful with posterior bias; able to take a few quick lateral steps prior to returning to bed     Balance Overall balance assessment: Needs assistance, History of Falls Sitting-balance support: Bilateral upper extremity supported, Feet supported Sitting balance-[name]: Fair Sitting balance - Comments: no LOB while seated EOB today     Standing balance-[name]: Zero Standing balance comment: Max A x2 and RW use to stand for short periods of time with posterior lean and heavy cueing                           ADL either performed or assessed with clinical judgement   ADL Overall ADL's : Needs assistance/impaired     Grooming: Wash/dry face;Set up;Sitting Grooming Details (indicate cue type and reason): sitting EOB             Lower Body Dressing: Bed level;Minimal assistance Lower Body Dressing Details (indicate cue type and reason): able to don R sock IND at bed level, needed Mod/Max A for L sock  Extremity/Trunk Assessment              Vision       Restaurant manager, fast food Communication: No apparent difficulties   Cognition Arousal: Alert Behavior During Therapy: WFL for tasks assessed/performed                                 Following commands: Intact        Cueing      Exercises      Shoulder Instructions       General Comments HR 89-97 throughout session    Pertinent Vitals/ Pain       Pain Assessment Pain  Assessment: Faces Faces Pain Scale: No hurt Pain Intervention(s): Monitored during session  Home Living                                          Prior Functioning/Environment              Frequency  Min 1X/week        Progress Toward Goals  OT Goals(current goals can now be found in the care plan section)  Progress towards OT goals: Progressing toward goals  Acute Rehab OT Goals Patient Stated Goal: get stronger OT Goal Formulation: With patient Time For Goal Achievement: 04/21/23 Potential to Achieve Goals: Fair  Plan      Co-evaluation    PT/OT/SLP Co-Evaluation/Treatment: Yes Reason for Co-Treatment: Complexity of the patient's impairments (multi-system involvement) PT goals addressed during session: Mobility/safety with mobility OT goals addressed during session: ADL's and self-care      AM-PAC OT "6 Clicks" Daily Activity     Outcome Measure   Help from another person eating meals?: None Help from another person taking care of personal grooming?: A Little Help from another person toileting, which includes using toliet, bedpan, or urinal?: Total Help from another person bathing (including washing, rinsing, drying)?: A Lot Help from another person to put on and taking off regular upper body clothing?: A Little Help from another person to put on and taking off regular lower body clothing?: A Lot 6 Click Score: 15    End of Session Equipment Utilized During Treatment: Rolling walker (2 wheels)  OT Visit Diagnosis: Unsteadiness on feet (R26.81);Other abnormalities of gait and mobility (R26.89);History of falling (Z91.81);Muscle weakness (generalized) (M62.81)   Activity Tolerance Patient tolerated treatment well   Patient Left in bed;with call bell/phone within reach;with bed alarm set   Nurse Communication          Time: 1914-7829 OT Time Calculation (min): 31 min  Charges: OT General Charges $OT Visit: 1 Visit OT  Treatments $Therapeutic Activity: 8-22 mins  Robert [name], OTR/L  04/11/23, 3:30 PM   Robert [name] 04/11/2023, 3:28 PM
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
## 2023-04-11 NOTE — Progress Notes (Signed)
PROGRESS NOTE    Robert [name]   ZOX:[id number] DOB: [birth year]  DOA: 04/03/2023 Date of Service: 04/11/23 which is hospital day 8  PCP: Corky Downs, MD    Hospital course / significant events:   HPI: 78 year old man with recently diagnosed sigmoid colon cancer status post sigmoid colon partial colectomy in November 2024, chronic anemia secondary to CKD, CKD stage IIIa, ESBL UTI in November 2024, chronic heart failure with reduced ejection fraction 40%, pericardial effusion status post pericardial window.  Sent in from nursing home for fall hypotension and altered mental status.  02/11: admitted to hospitalist service for severe sepsis, AKI, encephalopathy, hypotension. Suspect UTI. BCx collected. Given PRBC.  02/12: BCx (+)ESBL Klebsiella, on Meropenem, ID consulted  02/13.  Repeat blood cultures drawn 02/14.  Cr 2.39, Hgb 7.6, WBC 10.8, Plt 32 02/15.  Hgb down to 7.1 transfuse 1 unit PRBC.  Plt 26.  Cr down to 2.22. 02/16.  Cr down to 2.02, Hgb up to 8.2 after transfusion, Plt still low at 23. 02/17.  Cr down to 1.79, Hgb 7.7, Plt 25 02/18.  Cr 1.73, Hgb  7.2 and Plt 32.  Underwent bone marrow biopsy today.  Will transfuse 1 unit PRBC.  Case discussed with oncology and they will follow-up as outpatient and okay to go out to rehab with low platelets.  Case discussed with ID and OPAT orders were placed for IV antibiotics back at facility 02/19: pending placement/auth. Weather will likely mean no discharge today even if Berkley Harvey goes through.    Consultants:  Infectious disease  Hematology/Oncology  Palliative Care  Interventional Radiology   Procedures/Surgeries: 04/10/2023 Bone marrow biopsy - IR, Dr Archer Asa      ASSESSMENT & PLAN:   Severe sepsis w/ ESBL Klebsiella bacteremia d/t UTI Present on admission with leukocytosis, tachycardia, hypotension, acute kidney injury acute metabolic encephalopathy. Repeat cultures so far negative for 5 days.   Currently on  meropenem.  ID following recommends 2 weeks total of IV antibiotics.  Midline placed.  OPAT orders placed by ID.   Hopefully can get back to facility soon to complete antibiotic course.   Thrombocytopenia S/p Bone marrow biopsy.   discussed with Dr. Smith Robert and she is okay getting back to facility with the low platelets.  She will follow-up patient with appointment on 2/24.   Acute kidney injury superimposed on CKD 3a Creatinine peaked at 2.8 and current creatinine 1.73. Monitor BMP   Hypokalemia Replace orally    Anemia of chronic disease S/p PRBC x1 unit on 02/15 and 02/18 Follow w/ HemOnc Monitor CBC   Acute metabolic encephalopathy Mental status improved from admission Monitor    Generalized weakness Physical therapy recommending rehab   Hypomagnesemia Replace orally. Monitor serum levels    Right adrenal mass  Cyst of right kidney enlarged from previous. Follow outpatient with urology   Myocardial injury Demand ischemia from sepsis, ACS ruled out  Repeat troponin/EKG if chest pain   Chronic systolic CHF (congestive heart failure)  Currently no signs of heart failure.  Most recent EF 45 to 50%.   Lopressor switched over to Toprol.  consider restarting low-dose Lasix upon discharge.        overweight based on BMI: Body mass index is 26.98 kg/m.  Underweight - under 18  overweight - 25 to 29 obese - 30 or more Class 1 obesity: BMI of 30.0 to 34 Class 2 obesity: BMI of 35.0 to 39 Class 3 obesity: BMI of 40.0 to 49 Super Morbid  Obesity: BMI 50-59 Super-super Morbid Obesity: BMI 60+ Significantly low or high BMI is associated with higher medical risk.  Weight management advised as adjunct to other disease management and risk reduction treatments    DVT prophylaxis: no Rx ppx d/t low Plt IV fluids: no continuous IV fluids  Nutrition: regular diet Central lines / invasive devices: midline IV   Code Status: DNR ACP documentation reviewed:  none on file in  [facility]  TOC needs: placement Barriers to dispo / significant pending items: insurance auth pending for SNF              Subjective / Brief ROS:  Patient reports some abdominal discomfort but thinks d/t he is probably going to have BM today, denies nausea/vomiting, no serious abd pain  Denies CP/SOB.  Pain controlled.  Denies new weakness.  Tolerating diet.  Reports no concerns w/ urination/defecation.   Family Communication: none at this time     Objective Findings:  Vitals:   04/10/23 2046 04/10/23 2357 04/11/23 0512 04/11/23 0827  BP: 135/78 (!) 142/72 (!) 150/90 (!) 142/76  Pulse: 93 83 90 93  Resp: 18 20 18    Temp: 98.5 F (36.9 C) 98.4 F (36.9 C) 98.7 F (37.1 C) 97.8 F (36.6 C)  TempSrc: Oral   Oral  SpO2: 98% 97% 95% 97%  Weight:      Height:        Intake/Output Summary (Last 24 hours) at 04/11/2023 1441 Last data filed at 04/10/2023 2029 Gross per 24 hour  Intake 536 ml  Output 1800 ml  Net -1264 ml   Filed Weights   04/03/23 0605  Weight: 95.3 kg    Examination:  Physical Exam Constitutional:      General: He is not in acute distress. Cardiovascular:     Rate and Rhythm: Normal rate and regular rhythm.  Pulmonary:     Effort: Pulmonary effort is normal.     Breath sounds: Normal breath sounds.  Abdominal:     General: Abdomen is flat. Bowel sounds are normal. There is no distension.     Palpations: Abdomen is soft.  Musculoskeletal:     Right lower leg: No edema.     Left lower leg: No edema.  Neurological:     General: No focal deficit present.     Mental Status: He is alert and oriented to person, place, and time.  Psychiatric:        Mood and Affect: Mood normal.        Behavior: Behavior normal.          Scheduled Medications:   DULoxetine  60 mg Oral BID   ferrous sulfate  325 mg Oral BID WC   latanoprost  1 drop Both Eyes QHS   metoprolol succinate  50 mg Oral BID   mirtazapine  15 mg Oral Daily   oxybutynin   10 mg Oral QPM   pantoprazole  40 mg Oral BID AC   senna-docusate  1 tablet Oral BID   simvastatin  20 mg Oral QPM   sodium chloride flush  10-40 mL Intracatheter Q12H    Continuous Infusions:  ertapenem 1 g (04/11/23 0819)    PRN Medications:  bisacodyl, hydrOXYzine, melatonin, metoprolol tartrate, ondansetron **OR** ondansetron (ZOFRAN) IV, sodium chloride flush  Antimicrobials from admission:  Anti-infectives (From admission, onward)    Start     Dose/Rate Route Frequency Ordered Stop   04/11/23 1000  ertapenem (INVANZ) 1 g in sodium chloride 0.9 %  100 mL IVPB        1 g 200 mL/hr over 30 Minutes Intravenous Every 24 hours 04/10/23 1642 04/17/23 2359   04/04/23 0600  ceFEPIme (MAXIPIME) 2 g in sodium chloride 0.9 % 100 mL IVPB  Status:  Discontinued        2 g 200 mL/hr over 30 Minutes Intravenous Every 24 hours 04/03/23 0852 04/03/23 0859   04/03/23 1000  vancomycin (VANCOCIN) IVPB 1000 mg/200 mL premix       Placed in "Followed by" Linked Group   1,000 mg 200 mL/hr over 60 Minutes Intravenous  Once 04/03/23 0849 04/03/23 1228   04/03/23 1000  meropenem (MERREM) 1 g in sodium chloride 0.9 % 100 mL IVPB        1 g 200 mL/hr over 30 Minutes Intravenous Every 12 hours 04/03/23 0909 04/10/23 2130   04/03/23 0900  vancomycin (VANCOCIN) IVPB 1000 mg/200 mL premix       Placed in "Followed by" Linked Group   1,000 mg 200 mL/hr over 60 Minutes Intravenous  Once 04/03/23 0849 04/03/23 1103   04/03/23 0847  vancomycin variable dose per unstable renal function (pharmacist dosing)  Status:  Discontinued         Does not apply See admin instructions 04/03/23 0849 04/03/23 1456   04/03/23 0645  ceFEPIme (MAXIPIME) 2 g in sodium chloride 0.9 % 100 mL IVPB        2 g 200 mL/hr over 30 Minutes Intravenous  Once 04/03/23 0635 04/03/23 0757   04/03/23 0645  metroNIDAZOLE (FLAGYL) IVPB 500 mg        500 mg 100 mL/hr over 60 Minutes Intravenous  Once 04/03/23 0635 04/03/23 0922   04/03/23  0645  vancomycin (VANCOCIN) IVPB 1000 mg/200 mL premix  Status:  Discontinued        1,000 mg 200 mL/hr over 60 Minutes Intravenous  Once 04/03/23 0347 04/03/23 0849           Data Reviewed:  I have personally reviewed the following...  CBC: Recent Labs  Lab 04/07/23 0619 04/08/23 0801 04/09/23 0951 04/10/23 0500 04/11/23 0500  WBC 9.6 9.0 8.3 8.5 7.7  NEUTROABS  --   --  2.9 2.3  --   HGB 7.1* 8.2* 7.7* 7.2* 8.6*  HCT 24.2* 26.8* 25.8* 24.2* 27.6*  MCV 96.0 89.9 91.8 92.0 89.6  PLT 26* 23* 25* 32* 35*   Basic Metabolic Panel: Recent Labs  Lab 04/06/23 0451 04/07/23 0619 04/08/23 0801 04/09/23 0951 04/10/23 0500 04/11/23 0500  NA 139 138 139 139 138 139  K 3.2* 3.5 3.9 4.1 3.4* 4.0  CL 104 106 104 103 104 104  CO2 27 27 28 27 26 27   GLUCOSE 98 168* 88 94 98 97  BUN 46* 41* 39* 36* 36* 33*  CREATININE 2.39* 2.22* 2.02* 1.79* 1.73* 1.73*  CALCIUM 7.6* 7.7* 7.8* 7.9* 7.9* 8.2*  MG  --  1.3*  --   --  1.7  --   PHOS 2.9  --   --   --   --   --    GFR: Estimated Creatinine Clearance: 40.9 mL/min (A) (by C-G formula based on SCr of 1.73 mg/dL (H)). Liver Function Tests: Recent Labs  Lab 04/06/23 0451  ALBUMIN <1.5*   No results for input(s): "LIPASE", "AMYLASE" in the last 168 hours. No results for input(s): "AMMONIA" in the last 168 hours. Coagulation Profile: Recent Labs  Lab 04/10/23 0500  INR 1.4*   Cardiac  Enzymes: No results for input(s): "CKTOTAL", "CKMB", "CKMBINDEX", "TROPONINI" in the last 168 hours. BNP (last 3 results) No results for input(s): "PROBNP" in the last 8760 hours. HbA1C: No results for input(s): "HGBA1C" in the last 72 hours. CBG: Recent Labs  Lab 04/10/23 0827 04/10/23 1632 04/10/23 2047 04/11/23 0823 04/11/23 1155  GLUCAP 90 125* 111* 100* 139*   Lipid Profile: No results for input(s): "CHOL", "HDL", "LDLCALC", "TRIG", "CHOLHDL", "LDLDIRECT" in the last 72 hours. Thyroid Function Tests: No results for input(s):  "TSH", "T4TOTAL", "FREET4", "T3FREE", "THYROIDAB" in the last 72 hours. Anemia Panel: No results for input(s): "VITAMINB12", "FOLATE", "FERRITIN", "TIBC", "IRON", "RETICCTPCT" in the last 72 hours. Most Recent Urinalysis On File:     Component Value Date/Time   COLORURINE AMBER (A) 04/03/2023 1617   APPEARANCEUR CLOUDY (A) 04/03/2023 1617   LABSPEC 1.012 04/03/2023 1617   PHURINE 5.0 04/03/2023 1617   GLUCOSEU NEGATIVE 04/03/2023 1617   HGBUR LARGE (A) 04/03/2023 1617   BILIRUBINUR NEGATIVE 04/03/2023 1617   KETONESUR NEGATIVE 04/03/2023 1617   PROTEINUR 100 (A) 04/03/2023 1617   NITRITE NEGATIVE 04/03/2023 1617   LEUKOCYTESUR LARGE (A) 04/03/2023 1617   Sepsis Labs: @LABRCNTIP (procalcitonin:4,lacticidven:4) Microbiology: Recent Results (from the past 240 hours)  Culture, blood (Routine x 2)     Status: Abnormal   Collection Time: 04/03/23  5:22 AM   Specimen: BLOOD  Result Value Ref Range Status   Specimen Description   Final    BLOOD LEFT ANTECUBITAL Performed at [facility], [address] [place], [place] [address]    Special Requests   Final    BOTTLES DRAWN AEROBIC AND ANAEROBIC Blood Culture results may not be optimal due to an inadequate volume of blood received in culture bottles Performed at [facility], [address] [place], [place] [address]    Culture  Setup Time   Final    Organism ID to follow GRAM NEGATIVE RODS IN BOTH AEROBIC AND ANAEROBIC BOTTLES CRITICAL RESULT CALLED TO, READ BACK BY AND VERIFIED WITH: ANGELA DOBBS AT 2009 ON 04/03/23 BY SS Performed at [facility], [address] [place], [place] [address]    Culture (A)  Final    KLEBSIELLA PNEUMONIAE Confirmed Extended Spectrum Beta-Lactamase Producer (ESBL).  In bloodstream infections from ESBL organisms, carbapenems are preferred over piperacillin/tazobactam. They are shown to have a lower risk of mortality.    Report Status 04/06/2023 FINAL  Final   Organism  ID, Bacteria KLEBSIELLA PNEUMONIAE  Final      Susceptibility   Klebsiella pneumoniae - MIC*    AMPICILLIN >=32 RESISTANT Resistant     CEFEPIME >=32 RESISTANT Resistant     CEFTAZIDIME >=64 RESISTANT Resistant     CEFTRIAXONE >=64 RESISTANT Resistant     CIPROFLOXACIN 1 RESISTANT Resistant     GENTAMICIN <=1 SENSITIVE Sensitive     IMIPENEM 0.5 SENSITIVE Sensitive     TRIMETH/SULFA >=320 RESISTANT Resistant     AMPICILLIN/SULBACTAM >=32 RESISTANT Resistant     PIP/TAZO 32 INTERMEDIATE Intermediate ug/mL    * KLEBSIELLA PNEUMONIAE  Blood Culture ID Panel (Reflexed)     Status: Abnormal   Collection Time: 04/03/23  5:22 AM  Result Value Ref Range Status   Enterococcus faecalis NOT DETECTED NOT DETECTED Final   Enterococcus Faecium NOT DETECTED NOT DETECTED Final   Listeria monocytogenes NOT DETECTED NOT DETECTED Final   Staphylococcus species NOT DETECTED NOT DETECTED Final   Staphylococcus aureus (BCID) NOT DETECTED NOT DETECTED Final   Staphylococcus  epidermidis NOT DETECTED NOT DETECTED Final   Staphylococcus lugdunensis NOT DETECTED NOT DETECTED Final   Streptococcus species NOT DETECTED NOT DETECTED Final   Streptococcus agalactiae NOT DETECTED NOT DETECTED Final   Streptococcus pneumoniae NOT DETECTED NOT DETECTED Final   Streptococcus pyogenes NOT DETECTED NOT DETECTED Final   A.calcoaceticus-baumannii NOT DETECTED NOT DETECTED Final   Bacteroides fragilis NOT DETECTED NOT DETECTED Final   Enterobacterales DETECTED (A) NOT DETECTED Final    Comment: Enterobacterales represent a large order of gram negative bacteria, not a single organism. CRITICAL RESULT CALLED TO, READ BACK BY AND VERIFIED WITH: ANGELA DOBBS AT 2009 ON 04/03/23 BY SS    Enterobacter cloacae complex NOT DETECTED NOT DETECTED Final   Escherichia coli NOT DETECTED NOT DETECTED Final   Klebsiella aerogenes NOT DETECTED NOT DETECTED Final   Klebsiella oxytoca NOT DETECTED NOT DETECTED Final   Klebsiella  pneumoniae DETECTED (A) NOT DETECTED Final    Comment: CRITICAL RESULT CALLED TO, READ BACK BY AND VERIFIED WITH: ANGELA DOBBS AT 2009 ON 04/03/23 BY SS    Proteus species NOT DETECTED NOT DETECTED Final   Salmonella species NOT DETECTED NOT DETECTED Final   Serratia marcescens NOT DETECTED NOT DETECTED Final   Haemophilus influenzae NOT DETECTED NOT DETECTED Final   Neisseria meningitidis NOT DETECTED NOT DETECTED Final   Pseudomonas aeruginosa NOT DETECTED NOT DETECTED Final   Stenotrophomonas maltophilia NOT DETECTED NOT DETECTED Final   Candida albicans NOT DETECTED NOT DETECTED Final   Candida auris NOT DETECTED NOT DETECTED Final   Candida glabrata NOT DETECTED NOT DETECTED Final   Candida krusei NOT DETECTED NOT DETECTED Final   Candida parapsilosis NOT DETECTED NOT DETECTED Final   Candida tropicalis NOT DETECTED NOT DETECTED Final   Cryptococcus neoformans/gattii NOT DETECTED NOT DETECTED Final   CTX-M ESBL DETECTED (A) NOT DETECTED Final    Comment: CRITICAL RESULT CALLED TO, READ BACK BY AND VERIFIED WITH: ANGELA DOBBS AT 2009 ON 04/03/23 BY SS (NOTE) Extended spectrum beta-lactamase detected. Recommend a carbapenem as initial therapy.      Carbapenem resistance IMP NOT DETECTED NOT DETECTED Final   Carbapenem resistance KPC NOT DETECTED NOT DETECTED Final   Carbapenem resistance NDM NOT DETECTED NOT DETECTED Final   Carbapenem resist OXA 48 LIKE NOT DETECTED NOT DETECTED Final   Carbapenem resistance VIM NOT DETECTED NOT DETECTED Final    Comment: Performed at [facility], [address] [place], [place] [address]  Culture, blood (Routine x 2)     Status: Abnormal   Collection Time: 04/03/23  6:53 AM   Specimen: BLOOD  Result Value Ref Range Status   Specimen Description   Final    BLOOD RIGHT ANTECUBITAL Performed at [facility], [address], [place], [place] [address]    Special Requests   Final    BOTTLES DRAWN AEROBIC AND ANAEROBIC  Blood Culture adequate volume Performed at [facility], [address] [place], [place] [address]    Culture  Setup Time   Final    GRAM NEGATIVE RODS IN BOTH AEROBIC AND ANAEROBIC BOTTLES CRITICAL VALUE NOTED.  VALUE IS CONSISTENT WITH PREVIOUSLY REPORTED AND CALLED VALUE. Performed at [facility], [address] [place], [place] [address]    Culture (A)  Final    KLEBSIELLA PNEUMONIAE SUSCEPTIBILITIES PERFORMED ON PREVIOUS CULTURE WITHIN THE LAST 5 DAYS. Performed at [facility], [address] [place], [place] [address]    Report Status 04/06/2023 FINAL  Final  MRSA Next Gen by PCR, Nasal     Status: None   Collection Time: 04/03/23 12:14 PM   Specimen: Nasal Mucosa; Nasal Swab  Result Value Ref Range Status   MRSA by PCR Next Gen NOT DETECTED NOT DETECTED Final    Comment: (NOTE) The GeneXpert MRSA Assay (FDA approved for NASAL specimens only), is one component of a comprehensive MRSA colonization surveillance program. It is not intended to diagnose MRSA infection nor to guide or monitor treatment for MRSA infections. Test performance is not FDA approved in patients less than 49 years old. Performed at [facility], [address] [place], [place] [address]   Urine Culture     Status: None   Collection Time: 04/03/23  4:17 PM   Specimen: Urine, Clean Catch  Result Value Ref Range Status   Specimen Description   Final    URINE, CLEAN CATCH Performed at [facility], [address] [place], [place] [address]    Special Requests   Final    NONE Reflexed from [phone] Performed at [facility], [address] [place] [place], [place] [address]    Culture   Final    NO GROWTH Performed at [facility], [address], [place], [place] [address]    Report Status 04/05/2023 FINAL  Final  Culture, blood (Routine X 2) w Reflex to ID Panel     Status: None   Collection Time: 04/05/23  8:43 PM   Specimen: BLOOD  Result  Value Ref Range Status   Specimen Description BLOOD BLOOD LEFT ARM  Final   Special Requests   Final    BOTTLES DRAWN AEROBIC AND ANAEROBIC Blood Culture adequate volume   Culture   Final    NO GROWTH 5 DAYS Performed at [facility], [address] [place], [place] [address]    Report Status 04/10/2023 FINAL  Final  Culture, blood (Routine X 2) w Reflex to ID Panel     Status: None   Collection Time: 04/05/23  9:26 PM   Specimen: BLOOD  Result Value Ref Range Status   Specimen Description BLOOD BLOOD LEFT ARM  Final   Special Requests   Final    BOTTLES DRAWN AEROBIC AND ANAEROBIC Blood Culture results may not be optimal due to an inadequate volume of blood received in culture bottles   Culture   Final    NO GROWTH 5 DAYS Performed at [facility], [address], [place], [place] [address]    Report Status 04/10/2023 FINAL  Final      Radiology Studies last 3 days: IR BONE MARROW BIOPSY & ASPIRATION Result Date: 04/10/2023 INDICATION: 78 year old male with pancytopenia EXAM: FLUOROSCOPICALLY GUIDED BONE MARROW ASPIRATION AND CORE BIOPSY Interventional Radiologist:  Sterling Big, MD MEDICATIONS: None. ANESTHESIA/SEDATION: Moderate (conscious) sedation was employed during this procedure. A total of 1 milligrams versed and 50 micrograms fentanyl were administered intravenously. The patient's level of consciousness and vital signs were monitored continuously by radiology nursing throughout the procedure under my direct supervision. Total monitored sedation time: 10 minutes FLUOROSCOPY: RADIATION EXPOSURE INDEX: 5.6 MGY REFERENCE AIR KERMA COMPLICATIONS: None immediate. Estimated blood loss: <25 mL PROCEDURE: Informed written consent was obtained from the patient after a thorough discussion of the procedural risks, benefits and alternatives. All questions were addressed. Maximal Sterile Barrier Technique was utilized including caps, mask, sterile gowns, sterile  gloves, sterile drape, hand hygiene and skin antiseptic. A timeout was performed prior to the initiation  of the procedure. The patient was positioned prone and fluoroscopy was performed of the pelvis to demonstrate the iliac marrow spaces. Maximal barrier sterile technique utilized including caps, mask, sterile gowns, sterile gloves, large sterile drape, hand hygiene, and betadine prep. Under sterile conditions and local anesthesia, an 11 gauge coaxial bone biopsy needle was advanced into the left iliac marrow space. Needle position was confirmed with fluoroscopic imaging. Initially, bone marrow aspiration was performed. Next, the 11 gauge outer cannula was utilized to obtain a left iliac bone marrow core biopsy. Needle was removed. Hemostasis was obtained with compression. The patient tolerated the procedure well. Samples were prepared with the cytotechnologist. IMPRESSION: Successful image guided left bone marrow aspirate and core biopsy. Electronically Signed   By: Malachy Moan M.D.   On: 04/10/2023 10:09       Time spent: 50 min     Sunnie Nielsen, DO [facility] 04/11/2023, 2:41 PM    Dictation software may have been used to generate the above note. Typos may occur and escape review in typed/dictated notes. Please contact Dr Lyn Hollingshead directly for clarity if needed.  Staff may message me via secure chat in Epic  but this may not receive an immediate response,  please page me for urgent matters!  If 7PM-7AM, please contact night coverage [email or web address]

## 2023-04-11 NOTE — Plan of Care (Signed)

## 2023-04-11 NOTE — TOC Progression Note (Addendum)
Transition of Care [facility]) - Progression Note    Patient Details  Name: Robert [name] MRN: [id number] Date of Birth: 01/22/1946  [facility]) CM/SW Contact  Margarito Liner, LCSW Phone Number: 04/11/2023, 11:49 AM  Clinical Narrative:   CSW started SNF insurance authorization. [facility] admissions coordinator is aware.  4:36 pm: Auth still pending.  Expected Discharge Plan and Services                                               Social Determinants of Health (SDOH) Interventions SDOH Screenings   Food Insecurity: Patient Unable To Answer (04/04/2023)  Housing: Patient Unable To Answer (04/04/2023)  Transportation Needs: Patient Unable To Answer (04/04/2023)  Utilities: Patient Unable To Answer (04/04/2023)  Alcohol Screen: Low Risk  (10/29/2020)  Depression (PHQ2-9): Low Risk  (09/12/2021)  Financial Resource Strain: Low Risk  (12/15/2022)   Received from [facility]  Physical Activity: Inactive (10/29/2020)  Social Connections: Patient Unable To Answer (04/04/2023)  Stress: No Stress Concern Present (10/29/2020)  Tobacco Use: Low Risk  (04/10/2023)    Readmission Risk Interventions    04/04/2023   12:56 PM 02/07/2023   12:33 PM  Readmission Risk Prevention Plan  Transportation Screening Complete Complete  Medication Review (RN Care Manager) Complete Complete  PCP or Specialist appointment within 3-5 days of discharge Complete Complete  SW Recovery Care/Counseling Consult Complete Complete  Palliative Care Screening Not Applicable Not Applicable  Skilled Nursing Facility Complete Complete

## 2023-04-12 ENCOUNTER — Ambulatory Visit: Payer: Medicare Other | Admitting: Urology

## 2023-04-12 ENCOUNTER — Other Ambulatory Visit: Payer: Self-pay

## 2023-04-12 DIAGNOSIS — R6520 Severe sepsis without septic shock: Secondary | ICD-10-CM | POA: Diagnosis not present

## 2023-04-12 DIAGNOSIS — A419 Sepsis, unspecified organism: Secondary | ICD-10-CM | POA: Diagnosis not present

## 2023-04-12 LAB — GLUCOSE, CAPILLARY
Glucose-Capillary: 77 mg/dL (ref 70–99)
Glucose-Capillary: 91 mg/dL (ref 70–99)
Glucose-Capillary: 155 mg/dL — ABNORMAL HIGH (ref 70–99)

## 2023-04-12 LAB — CBC
HCT: 27.9 % — ABNORMAL LOW (ref 39.0–52.0)
Hemoglobin: 8.5 g/dL — ABNORMAL LOW (ref 13.0–17.0)
MCH: 27.3 pg (ref 26.0–34.0)
MCHC: 30.5 g/dL (ref 30.0–36.0)
MCV: 89.7 fL (ref 80.0–100.0)
Platelets: 42 10*3/uL — ABNORMAL LOW (ref 150–400)
RBC: 3.11 MIL/uL — ABNORMAL LOW (ref 4.22–5.81)
RDW: 21 % — ABNORMAL HIGH (ref 11.5–15.5)
WBC: 7.4 10*3/uL (ref 4.0–10.5)
nRBC: 0.4 % — ABNORMAL HIGH (ref 0.0–0.2)

## 2023-04-12 LAB — BASIC METABOLIC PANEL
Anion gap: 10 (ref 5–15)
BUN: 31 mg/dL — ABNORMAL HIGH (ref 8–23)
CO2: 27 mmol/L (ref 22–32)
Calcium: 8.1 mg/dL — ABNORMAL LOW (ref 8.9–10.3)
Chloride: 102 mmol/L (ref 98–111)
Creatinine, Ser: 1.53 mg/dL — ABNORMAL HIGH (ref 0.61–1.24)
GFR, Estimated: 46 mL/min — ABNORMAL LOW (ref >60)
Glucose, Bld: 95 mg/dL (ref 70–99)
Potassium: 3.9 mmol/L (ref 3.5–5.1)
Sodium: 139 mmol/L (ref 135–145)

## 2023-04-12 LAB — SURGICAL PATHOLOGY

## 2023-04-12 MED ORDER — FERROUS SULFATE 325 (65 FE) MG PO TABS
325.0000 mg | ORAL_TABLET | Freq: Two times a day (BID) | ORAL | 0 refills | Status: DC
  Filled 2023-04-12: qty 28, 14d supply, fill #0

## 2023-04-12 MED ORDER — BISACODYL 10 MG RE SUPP
10.0000 mg | Freq: Every day | RECTAL | 0 refills | Status: DC | PRN
  Filled 2023-04-12: qty 8, 8d supply, fill #0

## 2023-04-12 MED ORDER — MELATONIN 5 MG PO TABS
5.0000 mg | ORAL_TABLET | Freq: Every day | ORAL | 0 refills | Status: DC
  Filled 2023-04-12: qty 7, 7d supply, fill #0

## 2023-04-12 MED ORDER — OXYBUTYNIN CHLORIDE ER 10 MG PO TB24
10.0000 mg | ORAL_TABLET | Freq: Every evening | ORAL | 0 refills | Status: DC
  Filled 2023-04-12: qty 7, 7d supply, fill #0

## 2023-04-12 MED ORDER — FLEET ENEMA RE ENEM
1.0000 | ENEMA | Freq: Every day | RECTAL | 0 refills | Status: DC | PRN
  Filled 2023-04-12: qty 133, 1d supply, fill #0

## 2023-04-12 MED ORDER — LATANOPROST 0.005 % OP SOLN
1.0000 [drp] | Freq: Every day | OPHTHALMIC | 0 refills | Status: DC
  Filled 2023-04-12: qty 2.5, 50d supply, fill #0

## 2023-04-12 MED ORDER — BISACODYL 10 MG RE SUPP
10.0000 mg | Freq: Every day | RECTAL | Status: DC | PRN
  Administered 2023-04-12: 10 mg via RECTAL
  Filled 2023-04-12: qty 1

## 2023-04-12 MED ORDER — POLYETHYLENE GLYCOL 3350 17 G PO PACK
17.0000 g | PACK | Freq: Two times a day (BID) | ORAL | Status: DC
  Administered 2023-04-12: 17 g via ORAL
  Filled 2023-04-12: qty 1

## 2023-04-12 MED ORDER — SIMVASTATIN 20 MG PO TABS
20.0000 mg | ORAL_TABLET | Freq: Every day | ORAL | 0 refills | Status: DC
  Filled 2023-04-12: qty 7, 7d supply, fill #0

## 2023-04-12 MED ORDER — METOPROLOL SUCCINATE ER 50 MG PO TB24
50.0000 mg | ORAL_TABLET | Freq: Every day | ORAL | 0 refills | Status: DC
  Filled 2023-04-12: qty 7, 7d supply, fill #0

## 2023-04-12 MED ORDER — FUROSEMIDE 20 MG PO TABS
20.0000 mg | ORAL_TABLET | Freq: Every day | ORAL | 0 refills | Status: DC
  Filled 2023-04-12: qty 14, 14d supply, fill #0

## 2023-04-12 MED ORDER — ORAL CARE MOUTH RINSE: 15.0000 mL | OROMUCOSAL | Status: DC | PRN

## 2023-04-12 MED ORDER — ERTAPENEM IV (FOR PTA / DISCHARGE USE ONLY)
1.0000 g | INTRAVENOUS | 0 refills | Status: AC
Start: 2023-04-12 — End: 2023-04-17

## 2023-04-12 MED ORDER — DULOXETINE HCL 60 MG PO CPEP
60.0000 mg | ORAL_CAPSULE | Freq: Two times a day (BID) | ORAL | 0 refills | Status: DC
  Filled 2023-04-12: qty 14, 7d supply, fill #0

## 2023-04-12 MED ORDER — ONDANSETRON HCL 4 MG PO TABS
4.0000 mg | ORAL_TABLET | Freq: Four times a day (QID) | ORAL | 0 refills | Status: DC | PRN
  Filled 2023-04-12: qty 20, 5d supply, fill #0

## 2023-04-12 MED ORDER — SENNOSIDES-DOCUSATE SODIUM 8.6-50 MG PO TABS
2.0000 | ORAL_TABLET | Freq: Every day | ORAL | 0 refills | Status: DC | PRN
  Filled 2023-04-12: qty 14, 7d supply, fill #0

## 2023-04-12 MED ORDER — HYDROXYZINE HCL 25 MG PO TABS
25.0000 mg | ORAL_TABLET | Freq: Three times a day (TID) | ORAL | 0 refills | Status: DC | PRN
  Filled 2023-04-12: qty 10, 4d supply, fill #0

## 2023-04-12 MED ORDER — FLEET ENEMA RE ENEM: 1.0000 | ENEMA | Freq: Every day | RECTAL | Status: DC | PRN

## 2023-04-12 MED ORDER — PANTOPRAZOLE SODIUM 40 MG PO TBEC
40.0000 mg | DELAYED_RELEASE_TABLET | Freq: Two times a day (BID) | ORAL | 0 refills | Status: DC
  Filled 2023-04-12: qty 14, 7d supply, fill #0

## 2023-04-12 MED ORDER — POLYETHYLENE GLYCOL 3350 17 GM/SCOOP PO POWD
17.0000 g | Freq: Every day | ORAL | 0 refills | Status: DC
  Filled 2023-04-12: qty 238, 14d supply, fill #0

## 2023-04-12 MED ORDER — MIRTAZAPINE 15 MG PO TABS
15.0000 mg | ORAL_TABLET | Freq: Every day | ORAL | 0 refills | Status: DC
  Filled 2023-04-12: qty 7, 7d supply, fill #0

## 2023-04-12 NOTE — TOC Progression Note (Signed)
Transition of Care [facility]) - Progression Note    Patient Details  Name: Robert [name] MRN: [id number] Date of Birth: 05/03/1945  Transition of Care [facility]) CM/SW Contact  Margarito Liner, LCSW Phone Number: 04/12/2023, 9:05 AM  Clinical Narrative:  SNF auth approved. Auth number has not generated yet. Reference # [id number]. Valid 2/19-2/21. CSW left a message for the [facility] admissions coordinator to notify.   Expected Discharge Plan and Services                                               Social Determinants of Health (SDOH) Interventions SDOH Screenings   Food Insecurity: Patient Unable To Answer (04/04/2023)  Housing: Patient Unable To Answer (04/04/2023)  Transportation Needs: Patient Unable To Answer (04/04/2023)  Utilities: Patient Unable To Answer (04/04/2023)  Alcohol Screen: Low Risk  (10/29/2020)  Depression (PHQ2-9): Low Risk  (09/12/2021)  Financial Resource Strain: Low Risk  (12/15/2022)   Received from [facility]  Physical Activity: Inactive (10/29/2020)  Social Connections: Patient Unable To Answer (04/04/2023)  Stress: No Stress Concern Present (10/29/2020)  Tobacco Use: Low Risk  (04/10/2023)    Readmission Risk Interventions    04/04/2023   12:56 PM 02/07/2023   12:33 PM  Readmission Risk Prevention Plan  Transportation Screening Complete Complete  Medication Review (RN Care Manager) Complete Complete  PCP or Specialist appointment within 3-5 days of discharge Complete Complete  SW Recovery Care/Counseling Consult Complete Complete  Palliative Care Screening Not Applicable Not Applicable  Skilled Nursing Facility Complete Complete

## 2023-04-12 NOTE — TOC Transition Note (Addendum)
Transition of Care [facility]) - Discharge Note   Patient Details  Name: Robert [name] MRN: [id number] Date of Birth: 1945/04/09  Transition of Care [facility]) CM/SW Contact:  Margarito Liner, LCSW Phone Number: 04/12/2023, 2:25 PM   Clinical Narrative:  Patient has orders to discharge back to [facility] today. 7 days of medications including 3 doses of Ertapenum delivered to the unit to be sent with patient due to pharmacy/weather issues for the SNF. RN has already called report. EMS transport has been arranged and he is 4th on the list. No further concerns. CSW signing off.   Final next level of care: Skilled Nursing Facility Barriers to Discharge: Barriers Resolved   Patient Goals and CMS Choice            Discharge Placement   Existing PASRR number confirmed : 04/10/23          Patient chooses bed at: [facility] Patient to be transferred to facility by: EMS Name of family member notified: Left two voicemails for wife, Loreto Loescher Patient and family notified of of transfer: 04/12/23  Discharge Plan and Services Additional resources added to the After Visit Summary for                                       Social Drivers of Health (SDOH) Interventions SDOH Screenings   Food Insecurity: Patient Unable To Answer (04/04/2023)  Housing: Patient Unable To Answer (04/04/2023)  Transportation Needs: Patient Unable To Answer (04/04/2023)  Utilities: Patient Unable To Answer (04/04/2023)  Alcohol Screen: Low Risk  (10/29/2020)  Depression (PHQ2-9): Low Risk  (09/12/2021)  Financial Resource Strain: Low Risk  (12/15/2022)   Received from [facility]  Physical Activity: Inactive (10/29/2020)  Social Connections: Patient Unable To Answer (04/04/2023)  Stress: No Stress Concern Present (10/29/2020)  Tobacco Use: Low Risk  (04/10/2023)     Readmission Risk Interventions    04/04/2023   12:56 PM 02/07/2023   12:33 PM  Readmission  Risk Prevention Plan  Transportation Screening Complete Complete  Medication Review (RN Care Manager) Complete Complete  PCP or Specialist appointment within 3-5 days of discharge Complete Complete  SW Recovery Care/Counseling Consult Complete Complete  Palliative Care Screening Not Applicable Not Applicable  Skilled Nursing Facility Complete Complete

## 2023-04-12 NOTE — Plan of Care (Addendum)
PMT shadowing. Patient working with PT/OT and working towards D/C to SNF. PMT will continue to shadow for needs.

## 2023-04-12 NOTE — Plan of Care (Signed)

## 2023-04-12 NOTE — Progress Notes (Addendum)
ESBL ecoli bacteremia On ertapenem for a total of 14 days  Anemia Thrombocytopenia Had bone marrow biopsy Followed by heme onc Is getting discharged today to SNF After completion of IV remove Midline No follow up needed with ID as OP

## 2023-04-12 NOTE — Discharge Summary (Signed)
Physician Discharge Summary   Patient: Robert [name] MRN: [id number]  DOB: [birth year]   Admit:     Date of Admission: 04/03/2023 Admitted from: SNF   Discharge: Date of discharge: 04/12/23 Disposition: Skilled nursing facility Condition at discharge: good  CODE STATUS: DNR     Discharge Physician: Sunnie Nielsen, DO [facility]     PCP: Corky Downs, MD  Recommendations for Outpatient Follow-up:  Follow up with PCP Corky Downs, MD in 1-2 weeks Monitor CBC + BMP 1-2 times per week Follow up as directed with infectious disease, hematology/oncology, urology      Discharge Instructions     [facility] to provide Cath Flo 2mg    Complete by: As directed    Administer for PICC line occlusion and as ordered by physician for other access device issues.   Anaphylaxis Kit: Provided to treat any anaphylactic reaction to the medication being provided to the patient if First Dose or when requested by physician   Complete by: As directed    Epinephrine 1mg /ml vial / amp: Administer 0.3mg  (0.49ml) subcutaneously once for moderate to severe anaphylaxis, nurse to call physician and pharmacy when reaction occurs and call 911 if needed for immediate care   Diphenhydramine 50mg /ml IV vial: Administer 25-50mg  IV/IM PRN for first dose reaction, rash, itching, mild reaction, nurse to call physician and pharmacy when reaction occurs   Sodium Chloride 0.9% NS IV: Administer if needed for hypovolemic blood pressure drop or as ordered by physician after call to physician with anaphylactic reaction   Change dressing on IV access line weekly and PRN   Complete by: As directed    Flush IV access with Sodium Chloride 0.9% and Heparin 10 units/ml or 100 units/ml   Complete by: As directed    Home infusion instructions - [facility]   Complete by: As directed    Instructions: Flush IV access with Sodium Chloride 0.9% and Heparin 10units/ml or 100units/ml    Change dressing on IV access line: Weekly and PRN   Instructions Cath Flo 2mg : Administer for PICC Line occlusion and as ordered by physician for other access device   [facility] pharmacist to adjust dose for: Vancomycin, Aminoglycosides and other anti-infective therapies as requested by physician         Discharge Diagnoses: Principal Problem:   Severe sepsis (HCC) Active Problems:   Thrombocytopenia (HCC)   Acute kidney injury superimposed on CKD (HCC)   Hypokalemia   Anemia of chronic disease   Acute metabolic encephalopathy   Generalized weakness   Right adrenal mass (HCC)   Cyst of right kidney   Hypomagnesemia   Klebsiella sepsis (HCC)   Infection due to ESBL-producing Klebsiella pneumoniae   Bacteremia   Chronic systolic CHF (congestive heart failure) (HCC)   Myocardial injury       Hospital course / significant events:   HPI: 78 year old man with recently diagnosed sigmoid colon cancer status post sigmoid colon partial colectomy in November 2024, chronic anemia secondary to CKD, CKD stage IIIa, ESBL UTI in November 2024, chronic heart failure with reduced ejection fraction 40%, pericardial effusion status post pericardial window.  Sent in from nursing home for fall hypotension and altered mental status.  02/11: admitted to hospitalist service for severe sepsis, AKI, encephalopathy, hypotension. Suspect UTI. BCx collected. Given PRBC.  02/12: BCx (+)ESBL Klebsiella, on Meropenem, ID consulted  02/13.  Repeat blood cultures drawn 02/14.  Cr 2.39, Hgb 7.6, WBC 10.8, Plt 32  02/15.  Hgb down to 7.1 transfuse 1 unit PRBC.  Plt 26.  Cr down to 2.22. 02/16.  Cr down to 2.02, Hgb up to 8.2 after transfusion, Plt still low at 23. 02/17.  Cr down to 1.79, Hgb 7.7, Plt 25 02/18.  Cr 1.73, Hgb  7.2 and Plt 32.  Underwent bone marrow biopsy today.  Will transfuse 1 unit PRBC.  Case discussed with oncology and they will follow-up as outpatient and okay to go out to  rehab with low platelets.  Case discussed with ID and OPAT orders were placed for IV antibiotics back at facility 02/19-02/20: auth for SNF confirmed 02/20. Weather will potentially delay discharge, also need to get abx confirmed w/ SNF but pt has been medically stable    Consultants:  Infectious disease  Hematology/Oncology  Palliative Care  Interventional Radiology   Procedures/Surgeries: 04/10/2023 Bone marrow biopsy - IR, Dr Archer Asa      ASSESSMENT & PLAN:   Severe sepsis w/ ESBL Klebsiella bacteremia d/t UTI Present on admission with leukocytosis, tachycardia, hypotension, acute kidney injury acute metabolic encephalopathy. Repeat cultures so far negative for 5 days.   Currently on meropenem.  ID following recommends 2 weeks total of IV antibiotics.  Midline placed.  OPAT orders placed by ID.   Hopefully can get back to facility soon to complete antibiotic course.   Thrombocytopenia S/p Bone marrow biopsy.   discussed with Dr. Smith Robert and she is okay getting back to facility with the low platelets.  She will follow-up patient with appointment on 2/24.   Acute kidney injury superimposed on CKD 3a Creatinine peaked at 2.8 and current creatinine 1.73. Monitor BMP   Hypokalemia Replace orally as needed   Anemia of chronic disease S/p PRBC x1 unit on 02/15 and 02/18 Follow w/ HemOnc Monitor CBC   Acute metabolic encephalopathy Mental status improved from admission Monitor    Generalized weakness Physical therapy recommending rehab   Hypomagnesemia Replace orally. Monitor serum levels    Right adrenal mass  Cyst of right kidney enlarged from previous. Follow outpatient with urology   Myocardial injury Demand ischemia from sepsis, ACS ruled out  Repeat troponin/EKG if chest pain   Chronic systolic CHF (congestive heart failure)  Currently no signs of heart failure.  Most recent EF 45 to 50%.   Lopressor switched over to Toprol.  restarting low-dose Lasix  upon discharge.                  Discharge Instructions  Allergies as of 04/12/2023   No Known Allergies      Medication List     STOP taking these medications    insulin glargine-yfgn 100 UNIT/ML Pen Commonly known as: SEMGLEE   meclizine 12.5 MG tablet Commonly known as: ANTIVERT   omeprazole 20 MG tablet Commonly known as: PRILOSEC OTC   Quintabs Tabs       TAKE these medications    BD Pen Needle Nano 2nd Gen 32G X 4 MM Misc Generic drug: Insulin Pen Needle USE 1 PEN NEEDLE EVERY DAY AS DIRECTED   bisacodyl 10 MG suppository Commonly known as: DULCOLAX Place 1 suppository (10 mg total) rectally daily as needed for moderate constipation.   DULoxetine 60 MG capsule Commonly known as: Cymbalta Take 1 capsule (60 mg total) by mouth 2 (two) times daily.   ertapenem IVPB Commonly known as: INVANZ Inject 1 g into the vein daily for 5 days. Indication:  ESBL Klebsiella bacteremia Last Day of Therapy:  04/17/2023 Labs - Once weekly:  CBC/D and CMP Fax weekly lab results  promptly to [phone] Method of administration: Mini-Bag Plus / Gravity Method of administration may be changed at the discretion of home infusion pharmacist based upon assessment of the patient and/or caregiver's ability to self-administer the medication ordered. Please pull midline at completion of IV antibiotics Call [phone] with any critical value or questions   ferrous sulfate 325 (65 FE) MG tablet Take 1 tablet (325 mg total) by mouth 2 (two) times daily with a meal for 14 days.   furosemide 20 MG tablet Commonly known as: LASIX Take 1 tablet (20 mg total) by mouth daily. Increase to 1 tablet (20 mg total) by mouth TWICE daily (total daily dose 40 mg) as needed for up to 3 days for increased leg swelling, shortness of breath, weight gain 5+ lbs over 1-2 days. Seek medical care if these symptoms are not improving with increased dose. What changed:  medication  strength how much to take additional instructions   hydrOXYzine 25 MG tablet Commonly known as: ATARAX Take 1 tablet (25 mg total) by mouth 3 (three) times daily as needed for itching.   latanoprost 0.005 % ophthalmic solution Commonly known as: XALATAN Place 1 drop into both eyes at bedtime.   melatonin 5 MG Tabs Take 1 tablet (5 mg total) by mouth at bedtime. What changed:  when to take this reasons to take this   metoprolol succinate 50 MG 24 hr tablet Commonly known as: TOPROL-XL Take 1 tablet (50 mg total) by mouth daily. Take with or immediately following a meal.   mirtazapine 15 MG tablet Commonly known as: REMERON Take 1 tablet (15 mg total) by mouth daily.   ondansetron 4 MG tablet Commonly known as: ZOFRAN Take 1 tablet (4 mg total) by mouth every 6 (six) hours as needed for nausea.   oxybutynin 10 MG 24 hr tablet Commonly known as: DITROPAN-XL Take 1 tablet (10 mg total) by mouth every evening.   pantoprazole 40 MG tablet Commonly known as: PROTONIX Take 1 tablet (40 mg total) by mouth 2 (two) times daily before a meal.   polyethylene glycol 17 g packet Commonly known as: MIRALAX / GLYCOLAX Take 17 g by mouth daily. What changed:  when to take this reasons to take this   senna-docusate 8.6-50 MG tablet Commonly known as: Senokot-S Take 2 tablets by mouth daily as needed for mild constipation. What changed: how much to take   simvastatin 20 MG tablet Commonly known as: ZOCOR Take 1 tablet (20 mg total) by mouth daily.   sodium phosphate Enem Place 133 mLs (1 enema total) rectally daily as needed for severe constipation.               Discharge Care Instructions  (From admission, onward)           Start     Ordered   04/12/23 0000  Change dressing on IV access line weekly and PRN  (Home infusion instructions - [facility] )        04/12/23 1013             Contact information for follow-up providers     Stoioff,  Verna Czech, MD Follow up in 2 week(s).   Specialty: Urology Why: right adrenal mass, debris in bladder, repeated urinary infection Contact information: [address] [place] [address] [phone]         Creig Hines, MD  Follow up.   Specialty: Oncology Why: has appointment on 2/24, please confirm time. Contact information: [address] [place] [address] [phone]              Contact information for after-discharge care     Destination     [facility] Preferred SNF .   Service: Skilled Nursing Contact information: [address] [place] [address] [phone]                     No Known Allergies   Subjective: pt feeling well this morning, some abdominal discomfort which he states is mild / atteibutes to recent constipation issues/ NO N/V, no CP/SOB, tolerating diet    Discharge Exam: BP 111/69 (BP Location: Right Arm)   Pulse 83   Temp 99 F (37.2 C)   Resp 14   Ht 6\' 2"  (1.88 m)   Wt 95.3 kg   SpO2 96%   BMI 26.98 kg/m  General: Pt is alert, awake, not in acute distress Cardiovascular: RRR, S1/S2 +, no rubs, no gallops Respiratory: CTA bilaterally, no wheezing, no rhonchi Abdominal: Soft, NT, ND, bowel sounds + Extremities: no edema     The results of significant diagnostics from this hospitalization (including imaging, microbiology, ancillary and laboratory) are listed below for reference.     Microbiology: Recent Results (from the past 240 hours)  Culture, blood (Routine x 2)     Status: Abnormal   Collection Time: 04/03/23  5:22 AM   Specimen: BLOOD  Result Value Ref Range Status   Specimen Description   Final    BLOOD LEFT ANTECUBITAL Performed at [facility], [address], [place], [place] [address]    Special Requests   Final    BOTTLES DRAWN AEROBIC AND ANAEROBIC Blood Culture  results may not be optimal due to an inadequate volume of blood received in culture bottles Performed at [facility], [address] [place], [place] [address]    Culture  Setup Time   Final    Organism ID to follow GRAM NEGATIVE RODS IN BOTH AEROBIC AND ANAEROBIC BOTTLES CRITICAL RESULT CALLED TO, READ BACK BY AND VERIFIED WITH: ANGELA DOBBS AT 2009 ON 04/03/23 BY SS Performed at [facility], [address] [place], [place] [address]    Culture (A)  Final    KLEBSIELLA PNEUMONIAE Confirmed Extended Spectrum Beta-Lactamase Producer (ESBL).  In bloodstream infections from ESBL organisms, carbapenems are preferred over piperacillin/tazobactam. They are shown to have a lower risk of mortality.    Report Status 04/06/2023 FINAL  Final   Organism ID, Bacteria KLEBSIELLA PNEUMONIAE  Final      Susceptibility   Klebsiella pneumoniae - MIC*    AMPICILLIN >=32 RESISTANT Resistant     CEFEPIME >=32 RESISTANT Resistant     CEFTAZIDIME >=64 RESISTANT Resistant     CEFTRIAXONE >=64 RESISTANT Resistant     CIPROFLOXACIN 1 RESISTANT Resistant     GENTAMICIN <=1 SENSITIVE Sensitive     IMIPENEM 0.5 SENSITIVE Sensitive     TRIMETH/SULFA >=320 RESISTANT Resistant     AMPICILLIN/SULBACTAM >=32 RESISTANT Resistant     PIP/TAZO 32 INTERMEDIATE Intermediate ug/mL    * KLEBSIELLA PNEUMONIAE  Blood Culture ID Panel (Reflexed)     Status: Abnormal   Collection Time: 04/03/23  5:22 AM  Result Value Ref Range Status   Enterococcus faecalis NOT DETECTED NOT DETECTED Final  Enterococcus Faecium NOT DETECTED NOT DETECTED Final   Listeria monocytogenes NOT DETECTED NOT DETECTED Final   Staphylococcus species NOT DETECTED NOT DETECTED Final   Staphylococcus aureus (BCID) NOT DETECTED NOT DETECTED Final   Staphylococcus epidermidis NOT DETECTED NOT DETECTED Final   Staphylococcus lugdunensis NOT DETECTED NOT DETECTED Final   Streptococcus species NOT DETECTED NOT DETECTED Final    Streptococcus agalactiae NOT DETECTED NOT DETECTED Final   Streptococcus pneumoniae NOT DETECTED NOT DETECTED Final   Streptococcus pyogenes NOT DETECTED NOT DETECTED Final   A.calcoaceticus-baumannii NOT DETECTED NOT DETECTED Final   Bacteroides fragilis NOT DETECTED NOT DETECTED Final   Enterobacterales DETECTED (A) NOT DETECTED Final    Comment: Enterobacterales represent a large order of gram negative bacteria, not a single organism. CRITICAL RESULT CALLED TO, READ BACK BY AND VERIFIED WITH: ANGELA DOBBS AT 2009 ON 04/03/23 BY SS    Enterobacter cloacae complex NOT DETECTED NOT DETECTED Final   Escherichia coli NOT DETECTED NOT DETECTED Final   Klebsiella aerogenes NOT DETECTED NOT DETECTED Final   Klebsiella oxytoca NOT DETECTED NOT DETECTED Final   Klebsiella pneumoniae DETECTED (A) NOT DETECTED Final    Comment: CRITICAL RESULT CALLED TO, READ BACK BY AND VERIFIED WITH: ANGELA DOBBS AT 2009 ON 04/03/23 BY SS    Proteus species NOT DETECTED NOT DETECTED Final   Salmonella species NOT DETECTED NOT DETECTED Final   Serratia marcescens NOT DETECTED NOT DETECTED Final   Haemophilus influenzae NOT DETECTED NOT DETECTED Final   Neisseria meningitidis NOT DETECTED NOT DETECTED Final   Pseudomonas aeruginosa NOT DETECTED NOT DETECTED Final   Stenotrophomonas maltophilia NOT DETECTED NOT DETECTED Final   Candida albicans NOT DETECTED NOT DETECTED Final   Candida auris NOT DETECTED NOT DETECTED Final   Candida glabrata NOT DETECTED NOT DETECTED Final   Candida krusei NOT DETECTED NOT DETECTED Final   Candida parapsilosis NOT DETECTED NOT DETECTED Final   Candida tropicalis NOT DETECTED NOT DETECTED Final   Cryptococcus neoformans/gattii NOT DETECTED NOT DETECTED Final   CTX-M ESBL DETECTED (A) NOT DETECTED Final    Comment: CRITICAL RESULT CALLED TO, READ BACK BY AND VERIFIED WITH: ANGELA DOBBS AT 2009 ON 04/03/23 BY SS (NOTE) Extended spectrum beta-lactamase detected. Recommend a  carbapenem as initial therapy.      Carbapenem resistance IMP NOT DETECTED NOT DETECTED Final   Carbapenem resistance KPC NOT DETECTED NOT DETECTED Final   Carbapenem resistance NDM NOT DETECTED NOT DETECTED Final   Carbapenem resist OXA 48 LIKE NOT DETECTED NOT DETECTED Final   Carbapenem resistance VIM NOT DETECTED NOT DETECTED Final    Comment: Performed at [facility], [address] [place], [place] [address]  Culture, blood (Routine x 2)     Status: Abnormal   Collection Time: 04/03/23  6:53 AM   Specimen: BLOOD  Result Value Ref Range Status   Specimen Description   Final    BLOOD RIGHT ANTECUBITAL Performed at [facility], [address] [place], [place] [address]    Special Requests   Final    BOTTLES DRAWN AEROBIC AND ANAEROBIC Blood Culture adequate volume Performed at [facility], [address] [place], [place] [address]    Culture  Setup Time   Final    GRAM NEGATIVE RODS IN BOTH AEROBIC AND ANAEROBIC BOTTLES CRITICAL VALUE NOTED.  VALUE IS CONSISTENT WITH PREVIOUSLY REPORTED AND CALLED VALUE. Performed at [facility], [address] [place], [place] [address]    Culture (A)  Final    KLEBSIELLA PNEUMONIAE SUSCEPTIBILITIES PERFORMED ON PREVIOUS CULTURE WITHIN THE LAST 5 DAYS. Performed at [facility], [address], [place], [place] [address]    Report Status 04/06/2023 FINAL  Final  MRSA Next Gen by PCR, Nasal     Status: None   Collection Time: 04/03/23 12:14 PM   Specimen: Nasal Mucosa; Nasal Swab  Result Value Ref Range Status   MRSA by PCR Next Gen NOT DETECTED NOT DETECTED Final    Comment: (NOTE) The GeneXpert MRSA Assay (FDA approved for NASAL specimens only), is one component of a comprehensive MRSA colonization surveillance program. It is not intended to diagnose MRSA infection nor to guide or monitor treatment for MRSA infections. Test performance is not FDA approved in patients less than 46  years old. Performed at [facility], [address], [place], [place] [address]   Urine Culture     Status: None   Collection Time: 04/03/23  4:17 PM   Specimen: Urine, Clean Catch  Result Value Ref Range Status   Specimen Description   Final    URINE, CLEAN CATCH Performed at [facility], [address], [place], [place] [address]    Special Requests   Final    NONE Reflexed from [id number] Performed at [facility], [address] [place] [address] [place], [place] [address]    Culture   Final    NO GROWTH Performed at [facility], [address], [place], [place] [address]    Report Status 04/05/2023 FINAL  Final  Culture, blood (Routine X 2) w Reflex to ID Panel     Status: None   Collection Time: 04/05/23  8:43 PM   Specimen: BLOOD  Result Value Ref Range Status   Specimen Description BLOOD BLOOD LEFT ARM  Final   Special Requests   Final    BOTTLES DRAWN AEROBIC AND ANAEROBIC Blood Culture adequate volume   Culture   Final    NO GROWTH 5 DAYS Performed at [facility], [address] [place], [place] [address]    Report Status 04/10/2023 FINAL  Final  Culture, blood (Routine X 2) w Reflex to ID Panel     Status: None   Collection Time: 04/05/23  9:26 PM   Specimen: BLOOD  Result Value Ref Range Status   Specimen Description BLOOD BLOOD LEFT ARM  Final   Special Requests   Final    BOTTLES DRAWN AEROBIC AND ANAEROBIC Blood Culture results may not be optimal due to an inadequate volume of blood received in culture bottles   Culture   Final    NO GROWTH 5 DAYS Performed at [facility], [address] [place], [place] [address]    Report Status 04/10/2023 FINAL  Final     Labs: BNP (last 3 results) Recent Labs    08/22/22 1149  BNP 445.4*   Basic Metabolic Panel: Recent Labs  Lab 04/06/23 0451 04/07/23 0619 04/08/23 0801 04/09/23 0951 04/10/23 0500 04/11/23 0500 04/12/23 0503  NA 139 138 139 139 138 139 139  K  3.2* 3.5 3.9 4.1 3.4* 4.0 3.9  CL 104 106 104 103 104 104 102  CO2 27 27 28 27 26 27 27   GLUCOSE 98 168* 88 94 98 97 95  BUN 46* 41* 39* 36* 36* 33* 31*  CREATININE 2.39* 2.22* 2.02* 1.79* 1.73* 1.73* 1.53*  CALCIUM 7.6* 7.7* 7.8* 7.9* 7.9* 8.2* 8.1*  MG  --  1.3*  --   --  1.7  --   --   PHOS 2.9  --   --   --   --   --   --    Liver Function Tests: Recent Labs  Lab 04/06/23 0451  ALBUMIN <1.5*   No results for input(s): "LIPASE", "AMYLASE" in the last 168 hours. No results for input(s): "AMMONIA" in the last 168 hours. CBC: Recent Labs  Lab 04/08/23 0801 04/09/23 0951 04/10/23 0500 04/11/23 0500 04/12/23 0503  WBC 9.0 8.3 8.5 7.7 7.4  NEUTROABS  --  2.9 2.3  --   --   HGB 8.2* 7.7* 7.2* 8.6* 8.5*  HCT 26.8* 25.8* 24.2* 27.6* 27.9*  MCV 89.9 91.8 92.0 89.6 89.7  PLT 23* 25* 32* 35* 42*   Cardiac Enzymes: No results for input(s): "CKTOTAL", "CKMB", "CKMBINDEX", "TROPONINI" in the last 168 hours. BNP: Invalid input(s): "POCBNP" CBG: Recent Labs  Lab 04/11/23 0823 04/11/23 1155 04/11/23 1644 04/11/23 2129 04/12/23 0817  GLUCAP 100* 139* 131* 86 77   D-Dimer No results for input(s): "DDIMER" in the last 72 hours. Hgb A1c No results for input(s): "HGBA1C" in the last 72 hours. Lipid Profile No results for input(s): "CHOL", "HDL", "LDLCALC", "TRIG", "CHOLHDL", "LDLDIRECT" in the last 72 hours. Thyroid function studies No results for input(s): "TSH", "T4TOTAL", "T3FREE", "THYROIDAB" in the last 72 hours.  Invalid input(s): "FREET3" Anemia work up No results for input(s): "VITAMINB12", "FOLATE", "FERRITIN", "TIBC", "IRON", "RETICCTPCT" in the last 72 hours. Urinalysis    Component Value Date/Time   COLORURINE AMBER (A) 04/03/2023 1617   APPEARANCEUR CLOUDY (A) 04/03/2023 1617   LABSPEC 1.012 04/03/2023 1617   PHURINE 5.0 04/03/2023 1617   GLUCOSEU NEGATIVE 04/03/2023 1617   HGBUR LARGE (A) 04/03/2023 1617   BILIRUBINUR NEGATIVE 04/03/2023 1617   KETONESUR  NEGATIVE 04/03/2023 1617   PROTEINUR 100 (A) 04/03/2023 1617   NITRITE NEGATIVE 04/03/2023 1617   LEUKOCYTESUR LARGE (A) 04/03/2023 1617   Sepsis Labs Recent Labs  Lab 04/09/23 0951 04/10/23 0500 04/11/23 0500 04/12/23 0503  WBC 8.3 8.5 7.7 7.4   Microbiology Recent Results (from the past 240 hours)  Culture, blood (Routine x 2)     Status: Abnormal   Collection Time: 04/03/23  5:22 AM   Specimen: BLOOD  Result Value Ref Range Status   Specimen Description   Final    BLOOD LEFT ANTECUBITAL Performed at [facility], [address] [place], [place] [address]    Special Requests   Final    BOTTLES DRAWN AEROBIC AND ANAEROBIC Blood Culture results may not be optimal due to an inadequate volume of blood received in culture bottles Performed at [facility], [address] [place], [place] [address]    Culture  Setup Time   Final    Organism ID to follow GRAM NEGATIVE RODS IN BOTH AEROBIC AND ANAEROBIC BOTTLES CRITICAL RESULT CALLED TO, READ BACK BY AND VERIFIED WITH: ANGELA DOBBS AT 2009 ON 04/03/23 BY SS Performed at [facility], [address] [place], [place] [address]    Culture (A)  Final    KLEBSIELLA PNEUMONIAE Confirmed Extended Spectrum Beta-Lactamase Producer (ESBL).  In bloodstream infections from ESBL organisms, carbapenems are preferred over piperacillin/tazobactam. They are shown to have a lower risk of mortality.    Report Status 04/06/2023 FINAL  Final   Organism ID, Bacteria KLEBSIELLA PNEUMONIAE  Final      Susceptibility   Klebsiella pneumoniae - MIC*    AMPICILLIN >=32 RESISTANT Resistant  CEFEPIME >=32 RESISTANT Resistant     CEFTAZIDIME >=64 RESISTANT Resistant     CEFTRIAXONE >=64 RESISTANT Resistant     CIPROFLOXACIN 1 RESISTANT Resistant     GENTAMICIN <=1 SENSITIVE Sensitive     IMIPENEM 0.5 SENSITIVE Sensitive     TRIMETH/SULFA >=320 RESISTANT Resistant     AMPICILLIN/SULBACTAM >=32 RESISTANT Resistant      PIP/TAZO 32 INTERMEDIATE Intermediate ug/mL    * KLEBSIELLA PNEUMONIAE  Blood Culture ID Panel (Reflexed)     Status: Abnormal   Collection Time: 04/03/23  5:22 AM  Result Value Ref Range Status   Enterococcus faecalis NOT DETECTED NOT DETECTED Final   Enterococcus Faecium NOT DETECTED NOT DETECTED Final   Listeria monocytogenes NOT DETECTED NOT DETECTED Final   Staphylococcus species NOT DETECTED NOT DETECTED Final   Staphylococcus aureus (BCID) NOT DETECTED NOT DETECTED Final   Staphylococcus epidermidis NOT DETECTED NOT DETECTED Final   Staphylococcus lugdunensis NOT DETECTED NOT DETECTED Final   Streptococcus species NOT DETECTED NOT DETECTED Final   Streptococcus agalactiae NOT DETECTED NOT DETECTED Final   Streptococcus pneumoniae NOT DETECTED NOT DETECTED Final   Streptococcus pyogenes NOT DETECTED NOT DETECTED Final   A.calcoaceticus-baumannii NOT DETECTED NOT DETECTED Final   Bacteroides fragilis NOT DETECTED NOT DETECTED Final   Enterobacterales DETECTED (A) NOT DETECTED Final    Comment: Enterobacterales represent a large order of gram negative bacteria, not a single organism. CRITICAL RESULT CALLED TO, READ BACK BY AND VERIFIED WITH: ANGELA DOBBS AT 2009 ON 04/03/23 BY SS    Enterobacter cloacae complex NOT DETECTED NOT DETECTED Final   Escherichia coli NOT DETECTED NOT DETECTED Final   Klebsiella aerogenes NOT DETECTED NOT DETECTED Final   Klebsiella oxytoca NOT DETECTED NOT DETECTED Final   Klebsiella pneumoniae DETECTED (A) NOT DETECTED Final    Comment: CRITICAL RESULT CALLED TO, READ BACK BY AND VERIFIED WITH: ANGELA DOBBS AT 2009 ON 04/03/23 BY SS    Proteus species NOT DETECTED NOT DETECTED Final   Salmonella species NOT DETECTED NOT DETECTED Final   Serratia marcescens NOT DETECTED NOT DETECTED Final   Haemophilus influenzae NOT DETECTED NOT DETECTED Final   Neisseria meningitidis NOT DETECTED NOT DETECTED Final   Pseudomonas aeruginosa NOT DETECTED NOT DETECTED  Final   Stenotrophomonas maltophilia NOT DETECTED NOT DETECTED Final   Candida albicans NOT DETECTED NOT DETECTED Final   Candida auris NOT DETECTED NOT DETECTED Final   Candida glabrata NOT DETECTED NOT DETECTED Final   Candida krusei NOT DETECTED NOT DETECTED Final   Candida parapsilosis NOT DETECTED NOT DETECTED Final   Candida tropicalis NOT DETECTED NOT DETECTED Final   Cryptococcus neoformans/gattii NOT DETECTED NOT DETECTED Final   CTX-M ESBL DETECTED (A) NOT DETECTED Final    Comment: CRITICAL RESULT CALLED TO, READ BACK BY AND VERIFIED WITH: ANGELA DOBBS AT 2009 ON 04/03/23 BY SS (NOTE) Extended spectrum beta-lactamase detected. Recommend a carbapenem as initial therapy.      Carbapenem resistance IMP NOT DETECTED NOT DETECTED Final   Carbapenem resistance KPC NOT DETECTED NOT DETECTED Final   Carbapenem resistance NDM NOT DETECTED NOT DETECTED Final   Carbapenem resist OXA 48 LIKE NOT DETECTED NOT DETECTED Final   Carbapenem resistance VIM NOT DETECTED NOT DETECTED Final    Comment: Performed at [facility], [address] [place], [place] [address]  Culture, blood (Routine x 2)     Status: Abnormal   Collection Time: 04/03/23  6:53 AM   Specimen: BLOOD  Result Value Ref  Range Status   Specimen Description   Final    BLOOD RIGHT ANTECUBITAL Performed at [facility], [address], [place], [place] [address]    Special Requests   Final    BOTTLES DRAWN AEROBIC AND ANAEROBIC Blood Culture adequate volume Performed at [facility], [address] [place], [place] [address]    Culture  Setup Time   Final    GRAM NEGATIVE RODS IN BOTH AEROBIC AND ANAEROBIC BOTTLES CRITICAL VALUE NOTED.  VALUE IS CONSISTENT WITH PREVIOUSLY REPORTED AND CALLED VALUE. Performed at [facility], [address] [place], [place] [address]    Culture (A)  Final    KLEBSIELLA PNEUMONIAE SUSCEPTIBILITIES PERFORMED ON PREVIOUS CULTURE WITHIN THE LAST  5 DAYS. Performed at [facility], [address], [place], [place] [address]    Report Status 04/06/2023 FINAL  Final  MRSA Next Gen by PCR, Nasal     Status: None   Collection Time: 04/03/23 12:14 PM   Specimen: Nasal Mucosa; Nasal Swab  Result Value Ref Range Status   MRSA by PCR Next Gen NOT DETECTED NOT DETECTED Final    Comment: (NOTE) The GeneXpert MRSA Assay (FDA approved for NASAL specimens only), is one component of a comprehensive MRSA colonization surveillance program. It is not intended to diagnose MRSA infection nor to guide or monitor treatment for MRSA infections. Test performance is not FDA approved in patients less than 75 years old. Performed at [facility], [address] [place], [place] [address]   Urine Culture     Status: None   Collection Time: 04/03/23  4:17 PM   Specimen: Urine, Clean Catch  Result Value Ref Range Status   Specimen Description   Final    URINE, CLEAN CATCH Performed at [facility], [address] [place], [place] [address]    Special Requests   Final    NONE Reflexed from [phone] Performed at [facility], [address] [place], [place] [address]    Culture   Final    NO GROWTH Performed at [facility], [address] [place], [place] [address]    Report Status 04/05/2023 FINAL  Final  Culture, blood (Routine X 2) w Reflex to ID Panel     Status: None   Collection Time: 04/05/23  8:43 PM   Specimen: BLOOD  Result Value Ref Range Status   Specimen Description BLOOD BLOOD LEFT ARM  Final   Special Requests   Final    BOTTLES DRAWN AEROBIC AND ANAEROBIC Blood Culture adequate volume   Culture   Final    NO GROWTH 5 DAYS Performed at [facility], [address] [place], [place] [address]    Report Status 04/10/2023 FINAL  Final  Culture, blood (Routine X 2) w Reflex to ID Panel     Status: None   Collection Time: 04/05/23  9:26 PM   Specimen: BLOOD  Result Value Ref Range  Status   Specimen Description BLOOD BLOOD LEFT ARM  Final   Special Requests   Final    BOTTLES DRAWN AEROBIC AND ANAEROBIC Blood Culture results may not be optimal due to an inadequate volume of blood received in culture bottles   Culture   Final    NO GROWTH 5 DAYS Performed at [facility], [address] [place], [place] [address]    Report Status 04/10/2023 FINAL  Final   Imaging ECHOCARDIOGRAM COMPLETE Result Date: 04/04/2023  ECHOCARDIOGRAM REPORT   Patient Name:   Robert [name] Date of Exam: 04/04/2023 Medical Rec #:  [id number]          Height:       74.0 in Accession #:    [id number]         Weight:       210.1 lb Date of Birth:  [birth year]         BSA:          2.219 m Patient Age:    78 years           BP:           107/71 mmHg Patient Gender: M                  HR:           130 bpm. Exam Location:  [facility] Procedure: 2D Echo, Cardiac Doppler and Color Doppler (Both Spectral and Color            Flow Doppler were utilized during procedure). Indications:     CHF I50.21  History:         Patient has prior history of Echocardiogram examinations, most                  recent 12/22/2022. HFpEF and CHF, Arrythmias:Tachycardia; Risk                  Factors:Non-Smoker, Hypertension, Diabetes and Dyslipidemia.  Sonographer:     Dondra Prader RVT RCS Referring Phys:  [id number] Emeline General Diagnosing Phys: Alwyn Pea MD  Sonographer Comments: Elevated HR 130's bpm; Limited and poor windows therefore unable to use definity. IMPRESSIONS  1. Left ventricular ejection fraction, by estimation, is 45 to 50%. The left ventricle has mildly decreased function. The left ventricle has no regional wall motion abnormalities. Left ventricular diastolic parameters were normal.  2. Right ventricular systolic function is normal. The right ventricular size is normal.  3. The mitral valve is normal in structure. No evidence of mitral valve regurgitation.  4. The aortic valve is normal in structure.  Aortic valve regurgitation is not visualized. FINDINGS  Left Ventricle: Left ventricular ejection fraction, by estimation, is 45 to 50%. The left ventricle has mildly decreased function. The left ventricle has no regional wall motion abnormalities. Strain imaging was not performed. The left ventricular internal cavity size was normal in size. There is no left ventricular hypertrophy. Left ventricular diastolic parameters were normal. Right Ventricle: The right ventricular size is normal. No increase in right ventricular wall thickness. Right ventricular systolic function is normal. Left Atrium: Left atrial size was normal in size. Right Atrium: Right atrial size was normal in size. Pericardium: There is no evidence of pericardial effusion. Mitral Valve: The mitral valve is normal in structure. No evidence of mitral valve regurgitation. Tricuspid Valve: The tricuspid valve is normal in structure. Tricuspid valve regurgitation is trivial. Aortic Valve: The aortic valve is normal in structure. Aortic valve regurgitation is not visualized. Aortic valve mean gradient measures 3.0 mmHg. Aortic valve peak gradient measures 5.5 mmHg. Aortic valve area, by VTI measures 3.00 cm. Pulmonic Valve: The pulmonic valve was not well visualized. Pulmonic valve regurgitation is not visualized. Aorta: The ascending aorta was not well visualized. IAS/Shunts: No atrial level shunt detected by color flow Doppler. Additional Comments: 3D imaging was not performed.  LEFT VENTRICLE PLAX 2D LVIDd:         4.60 cm  Diastology LVIDs:         3.50 cm   LV e' medial: 12.30 cm/s LV PW:         1.10 cm LV IVS:        0.90 cm LVOT diam:     2.10 cm LV SV:         43 LV SV Index:   19 LVOT Area:     3.46 cm  RIGHT VENTRICLE             IVC RV S prime:     13.40 cm/s  IVC diam: 1.60 cm TAPSE (M-mode): 1.4 cm LEFT ATRIUM         Index LA diam:    3.20 cm 1.44 cm/m  AORTIC VALVE                    PULMONIC VALVE AV Area (Vmax):    2.95 cm     PV  Vmax:       0.82 m/s AV Area (Vmean):   2.91 cm     PV Peak grad:  2.7 mmHg AV Area (VTI):     3.00 cm AV Vmax:           117.00 cm/s AV Vmean:          78.400 cm/s AV VTI:            0.142 m AV Peak Grad:      5.5 mmHg AV Mean Grad:      3.0 mmHg LVOT Vmax:         99.80 cm/s LVOT Vmean:        65.900 cm/s LVOT VTI:          0.123 m LVOT/AV VTI ratio: 0.87  AORTA Ao Root diam: 4.00 cm  SHUNTS Systemic VTI:  0.12 m Systemic Diam: 2.10 cm Alwyn Pea MD Electronically signed by Alwyn Pea MD Signature Date/Time: 04/04/2023/8:30:26 PM    Final    DG Chest 1 View Result Date: 04/04/2023 CLINICAL DATA:  78 year old male with history of congestive heart failure. EXAM: CHEST  1 VIEW COMPARISON:  Chest x-ray 04/03/2023. FINDINGS: Lung volumes are low. There is cephalization of the pulmonary vasculature and slight indistinctness of the interstitial markings suggestive of mild pulmonary edema. Small bilateral pleural effusions. Mild cardiomegaly. The patient is rotated to the left on today's exam, resulting in distortion of the mediastinal contours and reduced diagnostic sensitivity and specificity for mediastinal pathology. Atherosclerotic calcifications are noted in the thoracic aorta. IMPRESSION: 1. The appearance the chest is most suggestive of congestive heart failure, as above. 2. Aortic atherosclerosis. Electronically Signed   By: Trudie Reed M.D.   On: 04/04/2023 07:02      Time coordinating discharge: over 30 minutes  SIGNED:  Sunnie Nielsen DO [facility]

## 2023-04-13 ENCOUNTER — Other Ambulatory Visit: Payer: Self-pay | Admitting: *Deleted

## 2023-04-13 ENCOUNTER — Encounter (HOSPITAL_COMMUNITY): Payer: Self-pay | Admitting: Oncology

## 2023-04-13 DIAGNOSIS — D649 Anemia, unspecified: Secondary | ICD-10-CM

## 2023-04-16 ENCOUNTER — Inpatient Hospital Stay: Payer: Medicare Other | Attending: Oncology | Admitting: Oncology

## 2023-04-16 ENCOUNTER — Encounter: Payer: Self-pay | Admitting: Oncology

## 2023-04-16 ENCOUNTER — Inpatient Hospital Stay: Payer: Medicare Other

## 2023-04-16 DIAGNOSIS — I6523 Occlusion and stenosis of bilateral carotid arteries: Secondary | ICD-10-CM | POA: Diagnosis not present

## 2023-04-16 DIAGNOSIS — C9200 Acute myeloblastic leukemia, not having achieved remission: Secondary | ICD-10-CM | POA: Insufficient documentation

## 2023-04-16 DIAGNOSIS — C187 Malignant neoplasm of sigmoid colon: Secondary | ICD-10-CM | POA: Diagnosis present

## 2023-04-16 DIAGNOSIS — Z9049 Acquired absence of other specified parts of digestive tract: Secondary | ICD-10-CM | POA: Diagnosis not present

## 2023-04-16 DIAGNOSIS — K7689 Other specified diseases of liver: Secondary | ICD-10-CM | POA: Diagnosis not present

## 2023-04-16 DIAGNOSIS — K449 Diaphragmatic hernia without obstruction or gangrene: Secondary | ICD-10-CM | POA: Insufficient documentation

## 2023-04-16 DIAGNOSIS — I509 Heart failure, unspecified: Secondary | ICD-10-CM | POA: Insufficient documentation

## 2023-04-16 DIAGNOSIS — Z823 Family history of stroke: Secondary | ICD-10-CM | POA: Insufficient documentation

## 2023-04-16 DIAGNOSIS — I708 Atherosclerosis of other arteries: Secondary | ICD-10-CM | POA: Diagnosis not present

## 2023-04-16 DIAGNOSIS — Q742 Other congenital malformations of lower limb(s), including pelvic girdle: Secondary | ICD-10-CM | POA: Diagnosis not present

## 2023-04-16 DIAGNOSIS — D61818 Other pancytopenia: Secondary | ICD-10-CM | POA: Insufficient documentation

## 2023-04-16 DIAGNOSIS — Z8249 Family history of ischemic heart disease and other diseases of the circulatory system: Secondary | ICD-10-CM | POA: Diagnosis not present

## 2023-04-16 DIAGNOSIS — N261 Atrophy of kidney (terminal): Secondary | ICD-10-CM | POA: Insufficient documentation

## 2023-04-16 DIAGNOSIS — D1779 Benign lipomatous neoplasm of other sites: Secondary | ICD-10-CM | POA: Diagnosis not present

## 2023-04-16 DIAGNOSIS — Z9181 History of falling: Secondary | ICD-10-CM | POA: Diagnosis not present

## 2023-04-16 DIAGNOSIS — M1990 Unspecified osteoarthritis, unspecified site: Secondary | ICD-10-CM | POA: Diagnosis not present

## 2023-04-16 DIAGNOSIS — Z79899 Other long term (current) drug therapy: Secondary | ICD-10-CM | POA: Diagnosis not present

## 2023-04-16 DIAGNOSIS — I700 Atherosclerosis of aorta: Secondary | ICD-10-CM | POA: Insufficient documentation

## 2023-04-16 DIAGNOSIS — E119 Type 2 diabetes mellitus without complications: Secondary | ICD-10-CM | POA: Diagnosis not present

## 2023-04-16 DIAGNOSIS — I110 Hypertensive heart disease with heart failure: Secondary | ICD-10-CM | POA: Diagnosis not present

## 2023-04-16 DIAGNOSIS — K8020 Calculus of gallbladder without cholecystitis without obstruction: Secondary | ICD-10-CM | POA: Diagnosis not present

## 2023-04-16 DIAGNOSIS — M869 Osteomyelitis, unspecified: Secondary | ICD-10-CM | POA: Diagnosis not present

## 2023-04-16 DIAGNOSIS — N390 Urinary tract infection, site not specified: Secondary | ICD-10-CM | POA: Diagnosis not present

## 2023-04-16 DIAGNOSIS — Z7189 Other specified counseling: Secondary | ICD-10-CM | POA: Diagnosis not present

## 2023-04-16 DIAGNOSIS — N200 Calculus of kidney: Secondary | ICD-10-CM | POA: Insufficient documentation

## 2023-04-16 DIAGNOSIS — I6782 Cerebral ischemia: Secondary | ICD-10-CM | POA: Diagnosis not present

## 2023-04-16 DIAGNOSIS — D649 Anemia, unspecified: Secondary | ICD-10-CM

## 2023-04-16 LAB — IRON AND TIBC: Iron: 68 ug/dL (ref 45–182)

## 2023-04-16 LAB — CBC WITH DIFFERENTIAL/PLATELET
Abs Immature Granulocytes: 0.19 10*3/uL — ABNORMAL HIGH (ref 0.00–0.07)
Basophils Absolute: 0 10*3/uL (ref 0.0–0.1)
Basophils Relative: 0 %
Eosinophils Absolute: 0 10*3/uL (ref 0.0–0.5)
Eosinophils Relative: 0 %
HCT: 31.2 % — ABNORMAL LOW (ref 39.0–52.0)
Hemoglobin: 9.1 g/dL — ABNORMAL LOW (ref 13.0–17.0)
Immature Granulocytes: 3 %
Lymphocytes Relative: 53 %
Lymphs Abs: 3.3 10*3/uL (ref 0.7–4.0)
MCH: 27.3 pg (ref 26.0–34.0)
MCHC: 29.2 g/dL — ABNORMAL LOW (ref 30.0–36.0)
MCV: 93.7 fL (ref 80.0–100.0)
Monocytes Absolute: 0.6 10*3/uL (ref 0.1–1.0)
Monocytes Relative: 10 %
Neutro Abs: 2.1 10*3/uL (ref 1.7–7.7)
Neutrophils Relative %: 34 %
Platelets: 64 10*3/uL — ABNORMAL LOW (ref 150–400)
RBC: 3.33 MIL/uL — ABNORMAL LOW (ref 4.22–5.81)
RDW: 19.8 % — ABNORMAL HIGH (ref 11.5–15.5)
WBC: 6.2 10*3/uL (ref 4.0–10.5)
nRBC: 0.3 % — ABNORMAL HIGH (ref 0.0–0.2)

## 2023-04-16 LAB — SAMPLE TO BLOOD BANK

## 2023-04-16 LAB — FERRITIN: Ferritin: 1672 ng/mL — ABNORMAL HIGH (ref 24–336)

## 2023-04-16 NOTE — Progress Notes (Signed)
 Hematology/Oncology Consult note [facility]  Telephone:([phone] [phone]  Patient Care Team: Corky Downs, MD as PCP - General (Internal Medicine) Creig Hines, MD as Consulting Physician (Oncology)   Name of the patient: Robert [name]  [id number]  [birth year]   Date of visit: 04/16/23  Diagnosis-new diagnosis of AML  Chief complaint/ Reason for visit-discuss bone marrow biopsy results and further management  Heme/Onc history: Patient is a 78 year old male with past medical history significant for hypertension hyperlipidemia type 2 diabetes peripheral arterial disease who is currently admitted to the hospital for symptoms of recurrent UTI and ESBL sepsis.  He was seen by me in January 2025 for posthospital discharge follow-up for pancytopenia.His hemoglobin from last year was 13 and had dropped down to 8.5 in June 2024. At that time his ferritin levels were normal at 121 but iron saturation low at 17% with a normal TIBC. Subsequently his iron saturation dropped to 13%. B12 levels and folate levels have been normal. We do not have any hemoglobin levels between last year and this year.  His anemia at that time was attributed to chronic disease but as planning to get a bone marrow biopsy down the line if the results of peripheral blood work did not reveal any discerning etiology.B12 level, TSH and haptoglobin at that time were normal.  Myeloma panel showed polyclonal increase in immunoglobulins but no monoclonal protein.  Serum free light chains showed both kappa and lambda light chain elevation but total ratio was normal.  Folate mildly low at 5.7.  Ferritin levels elevated at 1439   He underwent a colonoscopy by Dr. Timothy Lasso on 08/21/2022 due to concerns of anemia and he was found to have a 5 cm sigmoid mass which was biopsied and was consistent with intramucosal adenocarcinoma with focal area suspicious for at least superficial invasion into the  muscularis mucosa. There was another flat lesion noted in the ascending colon which was not biopsied but was concerning in appearance. 3 other polyps in the ascending colon positive for tubular adenoma. Patient underwent Sigmoid colon partial colectomy on 01/01/2023 at [facility]  Final pathology showed invasive adenocarcinoma moderately differentiated with negative margins.  15 lymph nodes negative for malignancy.  pT1 N0   Given his persistent pancytopenia patient underwent bone marrow Biopsy which was consistent with acute myeloid leukemia with total blasts ranging between 20 to 30%.  Overall bone marrow was hypercellular 75% with significantly left shifted both myeloid and erythroid lineages.Morphologically difficult to a certain definitive blasts from immature precursors.  Greater than 20% were identified however on aspirate smear slides, 17% by flow cytometry and overall 30% by CD34 IHC.  Significantly increased CD1 1 7 positive immature precursors.  Findings consistent with AML.  This could be AML arising from MDS.  Interval history-he is unsure if he is getting IV antibiotics at [facility]  I was also able to have his wife over video call today.  Overall he feels poorly and has been unable to ambulate  ECOG PS- 3 Pain scale- 0   Review of systems- Review of Systems  Constitutional:  Negative for chills, fever, malaise/fatigue and weight loss.  HENT:  Negative for congestion, ear discharge and nosebleeds.   Eyes:  Negative for blurred vision.  Respiratory:  Negative for cough, hemoptysis, sputum production, shortness of breath and wheezing.   Cardiovascular:  Negative for chest pain, palpitations, orthopnea and claudication.  Gastrointestinal:  Negative for abdominal pain, blood in stool, constipation, diarrhea, heartburn,  melena, nausea and vomiting.  Genitourinary:  Negative for dysuria, flank pain, frequency, hematuria and urgency.  Musculoskeletal:  Negative for back pain, joint pain  and myalgias.  Skin:  Negative for rash.  Neurological:  Negative for dizziness, tingling, focal weakness, seizures, weakness and headaches.  Endo/Heme/Allergies:  Does not bruise/bleed easily.  Psychiatric/Behavioral:  Negative for depression and suicidal ideas. The patient does not have insomnia.       No Known Allergies   Past Medical History:  Diagnosis Date   Allergy    Cellulitis    Diabetes mellitus without complication (HCC)    Hyperlipidemia    Hypertension    Peripheral vascular disease [facility])      Past Surgical History:  Procedure Laterality Date   BIOPSY  08/21/2022   Procedure: BIOPSY;  Surgeon: [name], DO;  Location: [facility] ENDOSCOPY;  Service: Gastroenterology;;   COLONOSCOPY WITH PROPOFOL N/A 10/02/2017   Procedure: COLONOSCOPY WITH PROPOFOL;  Surgeon: Wyline Mood, MD;  Location: [facility] ENDOSCOPY;  Service: Gastroenterology;  Laterality: N/A;   COLONOSCOPY WITH PROPOFOL N/A 08/21/2022   Procedure: COLONOSCOPY WITH PROPOFOL;  Surgeon: [name], DO;  Location: [facility] ENDOSCOPY;  Service: Gastroenterology;  Laterality: N/A;   ESOPHAGOGASTRODUODENOSCOPY (EGD) WITH PROPOFOL N/A 08/11/2022   Procedure: ESOPHAGOGASTRODUODENOSCOPY (EGD) WITH PROPOFOL;  Surgeon: Midge Minium, MD;  Location: [facility] ENDOSCOPY;  Service: Endoscopy;  Laterality: N/A;   HERNIA REPAIR     IR BONE MARROW BIOPSY & ASPIRATION  04/10/2023   PERICARDIOCENTESIS N/A 08/29/2022   Procedure: PERICARDIOCENTESIS;  Surgeon: Yvonne Kendall, MD;  Location: [facility] INVASIVE CV LAB;  Service: Cardiovascular;  Laterality: N/A;   POLYPECTOMY  08/21/2022   Procedure: POLYPECTOMY;  Surgeon: [name], DO;  Location: [facility] ENDOSCOPY;  Service: Gastroenterology;;    Social History   Socioeconomic History   Marital status: Married    Spouse name: Not on file   Number of children: 0   Years of education: College Gradute   Highest education level: Bachelor's degree (e.g., BA, AB, BS)   Occupational History   Not on file  Tobacco Use   Smoking status: Never   Smokeless tobacco: Never  Vaping Use   Vaping status: Never Used  Substance and Sexual Activity   Alcohol use: Not Currently    Comment: Occasionally   Drug use: No   Sexual activity: Not Currently  Other Topics Concern   Not on file  Social History Narrative   Not on file   Social Drivers of Health   Financial Resource Strain: Low Risk  (12/15/2022)   Received from [facility]   Overall Financial Resource Strain (CARDIA)    Difficulty of Paying Living Expenses: Not hard at all  Food Insecurity: Patient Unable To Answer (04/04/2023)   Hunger Vital Sign    Worried About Running Out of Food in the Last Year: Patient unable to answer    Ran Out of Food in the Last Year: Patient unable to answer  Transportation Needs: Patient Unable To Answer (04/04/2023)   PRAPARE - Transportation    Lack of Transportation (Medical): Patient unable to answer    Lack of Transportation (Non-Medical): Patient unable to answer  Physical Activity: Inactive (10/29/2020)   Exercise Vital Sign    Days of Exercise per Week: 0 days    Minutes of Exercise per Session: 0 min  Stress: No Stress Concern Present (10/29/2020)   [name] of Occupational Health - Occupational Stress Questionnaire    Feeling of Stress :  Not at all  Social Connections: Patient Unable To Answer (04/04/2023)   Social Connection and Isolation Panel [NHANES]    Frequency of Communication with Friends and Family: Patient unable to answer    Frequency of Social Gatherings with Friends and Family: Patient unable to answer    Attends Religious Services: Patient unable to answer    Active Member of Clubs or Organizations: Patient unable to answer    Attends Banker Meetings: Patient unable to answer    Marital Status: Patient unable to answer  Intimate Partner Violence: Not At Risk (04/04/2023)   Humiliation, Afraid, Rape, and  Kick questionnaire    Fear of Current or Ex-Partner: No    Emotionally Abused: No    Physically Abused: No    Sexually Abused: No    Family History  Problem Relation Age of Onset   Heart disease Mother    Stroke Mother    Heart disease Father    Heart attack Father      Current Outpatient Medications:    bisacodyl (DULCOLAX) 10 MG suppository, Place 1 suppository (10 mg total) rectally daily as needed for moderate constipation., Disp: 8 suppository, Rfl: 0   DULoxetine (CYMBALTA) 60 MG capsule, Take 1 capsule (60 mg total) by mouth 2 (two) times daily., Disp: 14 capsule, Rfl: 0   ertapenem (INVANZ) IVPB, Inject 1 g into the vein daily for 5 days. Indication:  ESBL Klebsiella bacteremia Last Day of Therapy:  04/17/2023 Labs - Once weekly:  CBC/D and CMP Fax weekly lab results  promptly to [phone] Method of administration: Mini-Bag Plus / Gravity Method of administration may be changed at the discretion of home infusion pharmacist based upon assessment of the patient and/or caregiver's ability to self-administer the medication ordered. Please pull midline at completion of IV antibiotics Call [phone] with any critical value or questions, Disp: 6 Units, Rfl: 0   ferrous sulfate 325 (65 FE) MG tablet, Take 1 tablet (325 mg total) by mouth 2 (two) times daily with a meal for 14 days., Disp: 28 tablet, Rfl: 0   furosemide (LASIX) 20 MG tablet, Take 1 tablet (20 mg total) by mouth daily. Increase to 1 tablet (20 mg total) by mouth TWICE daily (total daily dose 40 mg) as needed for up to 3 days for increased leg swelling, shortness of breath, weight gain 5+ lbs over 1-2 days. Seek medical care if these symptoms are not improving with increased dose., Disp: 14 tablet, Rfl: 0   hydrOXYzine (ATARAX) 25 MG tablet, Take 1 tablet (25 mg total) by mouth 3 (three) times daily as needed for itching., Disp: 10 tablet, Rfl: 0   latanoprost (XALATAN) 0.005 % ophthalmic solution, Place 1 drop into both  eyes at bedtime., Disp: 2.5 mL, Rfl: 0   melatonin 5 MG TABS, Take 1 tablet (5 mg total) by mouth at bedtime., Disp: 7 tablet, Rfl: 0   metoprolol succinate (TOPROL-XL) 50 MG 24 hr tablet, Take 1 tablet (50 mg total) by mouth daily. Take with or immediately following a meal., Disp: 7 tablet, Rfl: 0   mirtazapine (REMERON) 15 MG tablet, Take 1 tablet (15 mg total) by mouth daily., Disp: 7 tablet, Rfl: 0   ondansetron (ZOFRAN) 4 MG tablet, Take 1 tablet (4 mg total) by mouth every 6 (six) hours as needed for nausea., Disp: 20 tablet, Rfl: 0   oxybutynin (DITROPAN-XL) 10 MG 24 hr tablet, Take 1 tablet (10 mg total) by mouth every evening., Disp: 7 tablet,  Rfl: 0   pantoprazole (PROTONIX) 40 MG tablet, Take 1 tablet (40 mg total) by mouth 2 (two) times daily before a meal., Disp: 14 tablet, Rfl: 0   polyethylene glycol powder (GLYCOLAX/MIRALAX) 17 GM/SCOOP powder, Take 17 g by mouth daily., Disp: 238 g, Rfl: 0   senna-docusate (FT STOOL SOFTENER) 8.6-50 MG tablet, Take 2 tablets by mouth daily as needed for mild constipation., Disp: 14 tablet, Rfl: 0   simvastatin (ZOCOR) 20 MG tablet, Take 1 tablet (20 mg total) by mouth daily., Disp: 7 tablet, Rfl: 0   sodium phosphate (FLEET) ENEM, Place 133 mLs (1 enema total) rectally daily as needed for severe constipation., Disp: 133 mL, Rfl: 0   BD PEN NEEDLE NANO 2ND GEN 32G X 4 MM MISC, USE 1 PEN NEEDLE EVERY DAY AS DIRECTED, Disp: 100 each, Rfl: 4  Physical exam:  Vitals:   04/16/23 1113  BP: 114/77  Pulse: (!) 130  Resp: 19  Temp: (!) 96.7 F (35.9 C)  TempSrc: Tympanic  SpO2: 98%   Physical Exam Cardiovascular:     Rate and Rhythm: Normal rate and regular rhythm.     Heart sounds: Normal heart sounds.  Pulmonary:     Effort: Pulmonary effort is normal.     Breath sounds: Normal breath sounds.  Abdominal:     General: Bowel sounds are normal.     Palpations: Abdomen is soft.  Skin:    General: Skin is warm and dry.  Neurological:      Mental Status: He is alert and oriented to person, place, and time.         Latest Ref Rng & Units 04/12/2023    5:03 AM  CMP  Glucose 70 - 99 mg/dL 95   BUN 8 - 23 mg/dL 31   Creatinine 1.61 - 1.24 mg/dL 0.96   Sodium 045 - 409 mmol/L 139   Potassium 3.5 - 5.1 mmol/L 3.9   Chloride 98 - 111 mmol/L 102   CO2 22 - 32 mmol/L 27   Calcium 8.9 - 10.3 mg/dL 8.1       Latest Ref Rng & Units 04/16/2023   10:51 AM  CBC  WBC 4.0 - 10.5 K/uL 6.2   Hemoglobin 13.0 - 17.0 g/dL 9.1   Hematocrit 81.1 - 52.0 % 31.2   Platelets 150 - 400 K/uL 64     No images are attached to the encounter.  IR BONE MARROW BIOPSY & ASPIRATION Result Date: 04/10/2023 INDICATION: 78 year old male with pancytopenia EXAM: FLUOROSCOPICALLY GUIDED BONE MARROW ASPIRATION AND CORE BIOPSY Interventional Radiologist:  Sterling Big, MD MEDICATIONS: None. ANESTHESIA/SEDATION: Moderate (conscious) sedation was employed during this procedure. A total of 1 milligrams versed and 50 micrograms fentanyl were administered intravenously. The patient's level of consciousness and vital signs were monitored continuously by radiology nursing throughout the procedure under my direct supervision. Total monitored sedation time: 10 minutes FLUOROSCOPY: RADIATION EXPOSURE INDEX: 5.6 MGY REFERENCE AIR KERMA COMPLICATIONS: None immediate. Estimated blood loss: <25 mL PROCEDURE: Informed written consent was obtained from the patient after a thorough discussion of the procedural risks, benefits and alternatives. All questions were addressed. Maximal Sterile Barrier Technique was utilized including caps, mask, sterile gowns, sterile gloves, sterile drape, hand hygiene and skin antiseptic. A timeout was performed prior to the initiation of the procedure. The patient was positioned prone and fluoroscopy was performed of the pelvis to demonstrate the iliac marrow spaces. Maximal barrier sterile technique utilized including caps, mask, sterile gowns,  sterile  gloves, large sterile drape, hand hygiene, and betadine prep. Under sterile conditions and local anesthesia, an 11 gauge coaxial bone biopsy needle was advanced into the left iliac marrow space. Needle position was confirmed with fluoroscopic imaging. Initially, bone marrow aspiration was performed. Next, the 11 gauge outer cannula was utilized to obtain a left iliac bone marrow core biopsy. Needle was removed. Hemostasis was obtained with compression. The patient tolerated the procedure well. Samples were prepared with the cytotechnologist. IMPRESSION: Successful image guided left bone marrow aspirate and core biopsy. Electronically Signed   By: Malachy Moan M.D.   On: 04/10/2023 10:09   CT RENAL STONE STUDY Result Date: 04/06/2023 CLINICAL DATA:  Flank pain, colon cancer status post partial colectomy EXAM: CT ABDOMEN AND PELVIS WITHOUT CONTRAST TECHNIQUE: Multidetector CT imaging of the abdomen and pelvis was performed following the standard protocol without IV contrast. RADIATION DOSE REDUCTION: This exam was performed according to the departmental dose-optimization program which includes automated exposure control, adjustment of the mA and/or kV according to patient size and/or use of iterative reconstruction technique. COMPARISON:  08/18/2022 FINDINGS: Lower chest: Small bilateral pleural effusions with right lower lobe atelectasis or consolidation. Aortic and coronary artery atherosclerosis. Hepatobiliary: 4.6 cm cyst in the left hepatic lobe. No new liver abnormality identified on noncontrast images. Cholelithiasis. No pericholecystic inflammatory changes are evident. No biliary dilatation. Pancreas: Unremarkable. No pancreatic ductal dilatation or surrounding inflammatory changes. Spleen: Normal in size without focal abnormality. Adrenals/Urinary Tract: 4.9 cm mixed attenuation right adrenal gland mass which contains macroscopic fat (previously measured 4.5 cm). Unremarkable left adrenal  gland. Severely atrophic left kidney. Rounded 3.7 cm low-attenuation lesion within the interpolar region of the right kidney with possible wall thickening. A simple 2.0 cm cyst was noted at this location on the prior study. No renal stone or hydronephrosis. Tiny stones and or a small amount of debris within the bladder. Urinary bladder otherwise unremarkable. Stomach/Bowel: Small hiatal hernia. Stomach is within normal limits. No dilated loops of bowel. Interval partial sigmoid colon resection. No focal bowel wall thickening or inflammatory changes. Vascular/Lymphatic: Slight progression of retroperitoneal lymphadenopathy. Reference nodes include 1.4 cm retrocaval node (series 2, image 37), previously 0.6 cm. 1.5 cm left external iliac chain node (series 2, image 74), previously 1.3 cm. Aortoiliac atherosclerosis. Reproductive: Prostate is unremarkable. Other: Trace free fluid.  No fluid collections or pneumoperitoneum. Musculoskeletal: No acute or significant osseous findings. IMPRESSION: 1. Rounded 3.7 cm low-attenuation lesion within the interpolar region of the right kidney with possible wall thickening. A simple 2.0 cm cyst was noted at this location on the prior study. Cystic neoplasm or renal abscess not excluded. Correlation with urinalysis is recommended. Consider follow-up contrast enhanced renal protocol CT of the abdomen. 2. Small bilateral pleural effusions with right lower lobe atelectasis or consolidation. 3. Slight progression of retroperitoneal lymphadenopathy, which may be reactive or metastatic. 4. Cholelithiasis without evidence of acute cholecystitis. 5. Tiny stones and/or a small amount of debris within the bladder. 6. Slight interval enlargement of a 4.9 cm mixed attenuation right adrenal gland mass which contains macroscopic fat. This is favored to represent an adrenal myelolipoma. Follow-up nonemergent adrenal protocol CT or MRI is recommended. 7. Aortic atherosclerosis (ICD10-I70.0).  Electronically Signed   By: Duanne Guess D.O.   On: 04/06/2023 15:35   ECHOCARDIOGRAM COMPLETE Result Date: 04/04/2023    ECHOCARDIOGRAM REPORT   Patient Name:   MACYN REMMERT Date of Exam: 04/04/2023 Medical Rec #:  [id number]  Height:       74.0 in Accession #:    [id number]         Weight:       210.1 lb Date of Birth:  Oct 03, 1945         BSA:          2.219 m Patient Age:    77 years           BP:           107/71 mmHg Patient Gender: M                  HR:           130 bpm. Exam Location:  [facility] Procedure: 2D Echo, Cardiac Doppler and Color Doppler (Both Spectral and Color            Flow Doppler were utilized during procedure). Indications:     CHF I50.21  History:         Patient has prior history of Echocardiogram examinations, most                  recent 12/22/2022. HFpEF and CHF, Arrythmias:Tachycardia; Risk                  Factors:Non-Smoker, Hypertension, Diabetes and Dyslipidemia.  Sonographer:     Dondra Prader RVT RCS Referring Phys:  [id number] Emeline General Diagnosing Phys: Alwyn Pea MD  Sonographer Comments: Elevated HR 130's bpm; Limited and poor windows therefore unable to use definity. IMPRESSIONS  1. Left ventricular ejection fraction, by estimation, is 45 to 50%. The left ventricle has mildly decreased function. The left ventricle has no regional wall motion abnormalities. Left ventricular diastolic parameters were normal.  2. Right ventricular systolic function is normal. The right ventricular size is normal.  3. The mitral valve is normal in structure. No evidence of mitral valve regurgitation.  4. The aortic valve is normal in structure. Aortic valve regurgitation is not visualized. FINDINGS  Left Ventricle: Left ventricular ejection fraction, by estimation, is 45 to 50%. The left ventricle has mildly decreased function. The left ventricle has no regional wall motion abnormalities. Strain imaging was not performed. The left ventricular internal cavity size was normal  in size. There is no left ventricular hypertrophy. Left ventricular diastolic parameters were normal. Right Ventricle: The right ventricular size is normal. No increase in right ventricular wall thickness. Right ventricular systolic function is normal. Left Atrium: Left atrial size was normal in size. Right Atrium: Right atrial size was normal in size. Pericardium: There is no evidence of pericardial effusion. Mitral Valve: The mitral valve is normal in structure. No evidence of mitral valve regurgitation. Tricuspid Valve: The tricuspid valve is normal in structure. Tricuspid valve regurgitation is trivial. Aortic Valve: The aortic valve is normal in structure. Aortic valve regurgitation is not visualized. Aortic valve mean gradient measures 3.0 mmHg. Aortic valve peak gradient measures 5.5 mmHg. Aortic valve area, by VTI measures 3.00 cm. Pulmonic Valve: The pulmonic valve was not well visualized. Pulmonic valve regurgitation is not visualized. Aorta: The ascending aorta was not well visualized. IAS/Shunts: No atrial level shunt detected by color flow Doppler. Additional Comments: 3D imaging was not performed.  LEFT VENTRICLE PLAX 2D LVIDd:         4.60 cm   Diastology LVIDs:         3.50 cm   LV e' medial: 12.30 cm/s LV PW:  1.10 cm LV IVS:        0.90 cm LVOT diam:     2.10 cm LV SV:         43 LV SV Index:   19 LVOT Area:     3.46 cm  RIGHT VENTRICLE             IVC RV S prime:     13.40 cm/s  IVC diam: 1.60 cm TAPSE (M-mode): 1.4 cm LEFT ATRIUM         Index LA diam:    3.20 cm 1.44 cm/m  AORTIC VALVE                    PULMONIC VALVE AV Area (Vmax):    2.95 cm     PV Vmax:       0.82 m/s AV Area (Vmean):   2.91 cm     PV Peak grad:  2.7 mmHg AV Area (VTI):     3.00 cm AV Vmax:           117.00 cm/s AV Vmean:          78.400 cm/s AV VTI:            0.142 m AV Peak Grad:      5.5 mmHg AV Mean Grad:      3.0 mmHg LVOT Vmax:         99.80 cm/s LVOT Vmean:        65.900 cm/s LVOT VTI:          0.123 m  LVOT/AV VTI ratio: 0.87  AORTA Ao Root diam: 4.00 cm  SHUNTS Systemic VTI:  0.12 m Systemic Diam: 2.10 cm Alwyn Pea MD Electronically signed by Alwyn Pea MD Signature Date/Time: 04/04/2023/8:30:26 PM    Final    DG Chest 1 View Result Date: 04/04/2023 CLINICAL DATA:  78 year old male with history of congestive heart failure. EXAM: CHEST  1 VIEW COMPARISON:  Chest x-ray 04/03/2023. FINDINGS: Lung volumes are low. There is cephalization of the pulmonary vasculature and slight indistinctness of the interstitial markings suggestive of mild pulmonary edema. Small bilateral pleural effusions. Mild cardiomegaly. The patient is rotated to the left on today's exam, resulting in distortion of the mediastinal contours and reduced diagnostic sensitivity and specificity for mediastinal pathology. Atherosclerotic calcifications are noted in the thoracic aorta. IMPRESSION: 1. The appearance the chest is most suggestive of congestive heart failure, as above. 2. Aortic atherosclerosis. Electronically Signed   By: Trudie Reed M.D.   On: 04/04/2023 07:02   DG Foot 2 Views Right Result Date: 04/03/2023 CLINICAL DATA:  Osteomyelitis.  Fall. EXAM: RIGHT FOOT - 2 VIEW COMPARISON:  None Available. FINDINGS: Large plantar calcaneal spur. Atherosclerotic vascular calcifications. Degenerative spurring in the midfoot. The MTP joints are hyper extended with flexion of the distal interphalangeal joints suggesting second through fifth hammertoe deformities. As result of this, the phalanges are poorly characterized on the AP projection, with the x-ray projection oriented along or oblique to the long axis of the proximal phalanges. Interphalangeal osteoarthritis with loss of articular space and spurring. No malalignment or well-defined fracture. Type 1 accessory navicular. Spurring of the first digit sesamoids. Mild corticated lateral erosion of the head of the fifth metatarsal. IMPRESSION: 1. No malalignment or  well-defined fracture. 2. Large plantar calcaneal spur. 3. Atherosclerotic vascular calcifications. 4. Hammertoe deformities. 5. Osteoarthritis. 6. Mild corticated lateral erosion of the head of the fifth metatarsal. This is nonspecific but could  be seen with gout or rheumatoid arthropathy. Electronically Signed   By: Gaylyn Rong M.D.   On: 04/03/2023 10:33   DG Chest Port 1 View Result Date: 04/03/2023 CLINICAL DATA:  78 year old male with history of altered mental status and fall. EXAM: PORTABLE CHEST 1 VIEW COMPARISON:  Chest x-ray 03/17/2023. FINDINGS: Opacity at the right base which may reflect atelectasis and/or consolidation, with superimposed small right pleural effusion. Left lung is clear. No definite left pleural effusion. No pneumothorax. No evidence of pulmonary edema. Heart size appears borderline enlarged. The patient is rotated to the right on today's exam, resulting in distortion of the mediastinal contours and reduced diagnostic sensitivity and specificity for mediastinal pathology. Atherosclerotic calcifications are noted in the thoracic aorta. IMPRESSION: 1. Atelectasis and/or consolidation in the right lower lobe with small right pleural effusion. 2. Aortic atherosclerosis. Electronically Signed   By: Trudie Reed M.D.   On: 04/03/2023 06:43   CT Head Wo Contrast Result Date: 04/03/2023 CLINICAL DATA:  78 year old male with history of trauma from a fall. Altered mental status. EXAM: CT HEAD WITHOUT CONTRAST TECHNIQUE: Contiguous axial images were obtained from the base of the skull through the vertex without intravenous contrast. RADIATION DOSE REDUCTION: This exam was performed according to the departmental dose-optimization program which includes automated exposure control, adjustment of the mA and/or kV according to patient size and/or use of iterative reconstruction technique. COMPARISON:  Head CT 03/17/2023. FINDINGS: Brain: Mild cerebral atrophy. Patchy and confluent areas  of decreased attenuation are noted throughout the deep and periventricular white matter of the cerebral hemispheres bilaterally, compatible with chronic microvascular ischemic disease. No evidence of acute infarction, hemorrhage, hydrocephalus, extra-axial collection or mass lesion/mass effect. Vascular: No hyperdense vessel. Atherosclerotic calcifications are again noted in the internal carotid arteries bilaterally and in the vertebral arteries bilaterally. Skull: Normal. Negative for fracture or focal lesion. Sinuses/Orbits: Multifocal mucosal thickening throughout the paranasal sinuses bilaterally, slightly improved compared to the prior examination with resolution of previously noted air-fluid levels. Other: Large left mastoid effusion, unchanged. IMPRESSION: 1. No evidence of significant acute traumatic injury to the skull or brain. 2. Mild cerebral atrophy with chronic microvascular ischemic changes in the cerebral white matter, as above. 3. Chronic paranasal sinus disease, slightly improved compared to the prior study. 4. Large left mastoid effusion, unchanged. Electronically Signed   By: Trudie Reed M.D.   On: 04/03/2023 06:41   CT HEAD WO CONTRAST ( ) Result Date: 03/17/2023 CLINICAL DATA:  Headache. EXAM: CT HEAD WITHOUT CONTRAST TECHNIQUE: Contiguous axial images were obtained from the base of the skull through the vertex without intravenous contrast. RADIATION DOSE REDUCTION: This exam was performed according to the departmental dose-optimization program which includes automated exposure control, adjustment of the mA and/or kV according to patient size and/or use of iterative reconstruction technique. COMPARISON:  CT head without contrast 01/08/2023 FINDINGS: Brain: Moderate atrophy and white matter changes are stable. No acute infarct, hemorrhage, or mass lesion is present. Deep brain nuclei are within normal limits. The ventricles are of normal size. No significant extraaxial fluid collection  is present. The brainstem and cerebellum are within normal limits. Midline structures are within normal limits. Vascular: Atherosclerotic calcifications are present within the cavernous internal carotid arteries at the dural margin of the right vertebral artery and in the distal left vertebral artery. No hyperdense vessel is present. Skull: Calvarium is intact. No focal lytic or blastic lesions are present. No significant extracranial soft tissue lesion is present. Sinuses/Orbits: Near-total opacification of the  right maxillary sinus present. Hyperdensity is noted centrally. A fluid level is present left maxillary sinus. The ethmoid air cells are opacified bilaterally. Fluid levels are present in the frontal sinuses bilaterally and in both sphenoid sinuses. Left middle ear and mastoid effusion is present. No osseous destruction is present. Bilateral lens replacements are noted. Globes and orbits are otherwise unremarkable. IMPRESSION: 1. No acute intracranial abnormality or significant interval change. 2. Stable atrophy and white matter disease. This likely reflects the sequela of chronic microvascular ischemia. 3. Extensive sinus disease. This could be the etiology of the patient's headache. 4. Left middle ear and mastoid effusion. Electronically Signed   By: Marin Roberts M.D.   On: 03/17/2023 18:55     Assessment and plan- Patient is a 78 y.o. 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  Total blast count and bone marrow was 20% by manual count, 17% by flow cytometry and 30% by CD34 IHC.  Discussed with the patient that overall untreated AML carries a poor prognosis of about 3 to 6 months.  Hypomethylating agent such as Vidaza could be considered for him but presently his performance status is poor and he is still completing IV  antibiotics for ESBL UTI.  If we do decide to pursue Vidaza it will be given 7 days each month every 28 days.  He will also need to come back and forth for weekly blood counts and possible transfusions.  Discussed risks and benefits of Vidaza including all but not limited to nausea vomiting low blood counts and risk of infections.  We also discussed that proceeding with best supportive care/hospice may be a reasonable option at this time given how poor his quality of life has been over the last 3 to 4 months.  A phase 3 trial of 488 patients >=36 years old with newly diagnosed AML reported that azacitidine achieved superior survival compared with best supportive care [facility]) . Each enrolled patient was preselected for a conventional care regimen (CCR; either LoDAC, intensive induction chemotherapy, or BSC); within each CCR subgroup, patients were randomly assigned to azacitidine versus the preselected CCR. Compared with the overall CCR cohort, azacitidine achieved marginally superior median overall survival (OS; 10 versus 7 months; hazard ratio [HR] 0.85; 95% CI 0.69-1.03) and one-year OS (47 versus 34 percent). Among those preselected for [facility], patients randomized to azacitidine achieved superior OS (6 versus 4 months; HR 0.60; 95% CI 0.38-0.95); OS did not differ between patients who were randomly assigned to azacitidine versus either LoDAC or intensive induction therapy.  Patient would like to think about his options and get back to me in about 3 days time.   Visit Diagnosis 1. Goals of care, counseling/discussion   2. Acute myeloid leukemia not having achieved remission (HCC)      Dr. Owens Shark, MD, MPH [facility] at [facility] [phone] 04/16/2023 3:06 PM

## 2023-04-17 ENCOUNTER — Encounter (HOSPITAL_COMMUNITY): Payer: Self-pay | Admitting: Oncology

## 2023-04-23 ENCOUNTER — Other Ambulatory Visit: Payer: Self-pay

## 2023-04-23 DIAGNOSIS — D649 Anemia, unspecified: Secondary | ICD-10-CM

## 2023-04-24 ENCOUNTER — Encounter (HOSPITAL_COMMUNITY): Payer: Self-pay | Admitting: Oncology

## 2023-05-01 ENCOUNTER — Other Ambulatory Visit: Payer: Self-pay

## 2023-05-01 ENCOUNTER — Telehealth: Payer: Self-pay | Admitting: Pharmacy Technician

## 2023-05-01 ENCOUNTER — Inpatient Hospital Stay (HOSPITAL_BASED_OUTPATIENT_CLINIC_OR_DEPARTMENT_OTHER): Admitting: Nurse Practitioner

## 2023-05-01 ENCOUNTER — Telehealth: Payer: Self-pay

## 2023-05-01 ENCOUNTER — Inpatient Hospital Stay: Attending: Oncology

## 2023-05-01 ENCOUNTER — Other Ambulatory Visit (HOSPITAL_COMMUNITY): Payer: Self-pay

## 2023-05-01 ENCOUNTER — Encounter: Payer: Self-pay | Admitting: Oncology

## 2023-05-01 ENCOUNTER — Encounter: Payer: Self-pay | Admitting: Nurse Practitioner

## 2023-05-01 ENCOUNTER — Telehealth: Payer: Self-pay | Admitting: Nurse Practitioner

## 2023-05-01 ENCOUNTER — Inpatient Hospital Stay: Admitting: Pharmacist

## 2023-05-01 VITALS — BP 109/80 | HR 136 | Temp 97.6°F | Resp 19

## 2023-05-01 DIAGNOSIS — D649 Anemia, unspecified: Secondary | ICD-10-CM

## 2023-05-01 DIAGNOSIS — E785 Hyperlipidemia, unspecified: Secondary | ICD-10-CM | POA: Insufficient documentation

## 2023-05-01 DIAGNOSIS — R42 Dizziness and giddiness: Secondary | ICD-10-CM | POA: Insufficient documentation

## 2023-05-01 DIAGNOSIS — K8020 Calculus of gallbladder without cholecystitis without obstruction: Secondary | ICD-10-CM | POA: Insufficient documentation

## 2023-05-01 DIAGNOSIS — M869 Osteomyelitis, unspecified: Secondary | ICD-10-CM | POA: Insufficient documentation

## 2023-05-01 DIAGNOSIS — I509 Heart failure, unspecified: Secondary | ICD-10-CM | POA: Diagnosis not present

## 2023-05-01 DIAGNOSIS — D1779 Benign lipomatous neoplasm of other sites: Secondary | ICD-10-CM | POA: Diagnosis not present

## 2023-05-01 DIAGNOSIS — R531 Weakness: Secondary | ICD-10-CM | POA: Diagnosis not present

## 2023-05-01 DIAGNOSIS — Z7969 Long term (current) use of other immunomodulators and immunosuppressants: Secondary | ICD-10-CM | POA: Insufficient documentation

## 2023-05-01 DIAGNOSIS — N390 Urinary tract infection, site not specified: Secondary | ICD-10-CM | POA: Insufficient documentation

## 2023-05-01 DIAGNOSIS — Z5111 Encounter for antineoplastic chemotherapy: Secondary | ICD-10-CM | POA: Insufficient documentation

## 2023-05-01 DIAGNOSIS — D696 Thrombocytopenia, unspecified: Secondary | ICD-10-CM

## 2023-05-01 DIAGNOSIS — R0602 Shortness of breath: Secondary | ICD-10-CM | POA: Insufficient documentation

## 2023-05-01 DIAGNOSIS — N261 Atrophy of kidney (terminal): Secondary | ICD-10-CM | POA: Insufficient documentation

## 2023-05-01 DIAGNOSIS — D61818 Other pancytopenia: Secondary | ICD-10-CM | POA: Diagnosis not present

## 2023-05-01 DIAGNOSIS — I700 Atherosclerosis of aorta: Secondary | ICD-10-CM | POA: Diagnosis not present

## 2023-05-01 DIAGNOSIS — N200 Calculus of kidney: Secondary | ICD-10-CM | POA: Insufficient documentation

## 2023-05-01 DIAGNOSIS — C9200 Acute myeloblastic leukemia, not having achieved remission: Secondary | ICD-10-CM | POA: Diagnosis present

## 2023-05-01 DIAGNOSIS — R768 Other specified abnormal immunological findings in serum: Secondary | ICD-10-CM | POA: Insufficient documentation

## 2023-05-01 DIAGNOSIS — I708 Atherosclerosis of other arteries: Secondary | ICD-10-CM | POA: Diagnosis not present

## 2023-05-01 DIAGNOSIS — E1151 Type 2 diabetes mellitus with diabetic peripheral angiopathy without gangrene: Secondary | ICD-10-CM | POA: Insufficient documentation

## 2023-05-01 DIAGNOSIS — M1990 Unspecified osteoarthritis, unspecified site: Secondary | ICD-10-CM | POA: Insufficient documentation

## 2023-05-01 DIAGNOSIS — I2510 Atherosclerotic heart disease of native coronary artery without angina pectoris: Secondary | ICD-10-CM | POA: Insufficient documentation

## 2023-05-01 DIAGNOSIS — K449 Diaphragmatic hernia without obstruction or gangrene: Secondary | ICD-10-CM | POA: Insufficient documentation

## 2023-05-01 DIAGNOSIS — Z7189 Other specified counseling: Secondary | ICD-10-CM

## 2023-05-01 DIAGNOSIS — N179 Acute kidney failure, unspecified: Secondary | ICD-10-CM | POA: Insufficient documentation

## 2023-05-01 DIAGNOSIS — I6523 Occlusion and stenosis of bilateral carotid arteries: Secondary | ICD-10-CM | POA: Diagnosis not present

## 2023-05-01 DIAGNOSIS — Z8249 Family history of ischemic heart disease and other diseases of the circulatory system: Secondary | ICD-10-CM | POA: Insufficient documentation

## 2023-05-01 DIAGNOSIS — Q742 Other congenital malformations of lower limb(s), including pelvic girdle: Secondary | ICD-10-CM | POA: Insufficient documentation

## 2023-05-01 DIAGNOSIS — I6782 Cerebral ischemia: Secondary | ICD-10-CM | POA: Diagnosis not present

## 2023-05-01 DIAGNOSIS — I110 Hypertensive heart disease with heart failure: Secondary | ICD-10-CM | POA: Diagnosis not present

## 2023-05-01 DIAGNOSIS — K7689 Other specified diseases of liver: Secondary | ICD-10-CM | POA: Insufficient documentation

## 2023-05-01 DIAGNOSIS — W19XXXA Unspecified fall, initial encounter: Secondary | ICD-10-CM | POA: Diagnosis not present

## 2023-05-01 DIAGNOSIS — Z9049 Acquired absence of other specified parts of digestive tract: Secondary | ICD-10-CM | POA: Insufficient documentation

## 2023-05-01 DIAGNOSIS — D638 Anemia in other chronic diseases classified elsewhere: Secondary | ICD-10-CM | POA: Insufficient documentation

## 2023-05-01 DIAGNOSIS — Z823 Family history of stroke: Secondary | ICD-10-CM | POA: Insufficient documentation

## 2023-05-01 DIAGNOSIS — Z79899 Other long term (current) drug therapy: Secondary | ICD-10-CM | POA: Insufficient documentation

## 2023-05-01 LAB — CBC WITH DIFFERENTIAL (CANCER CENTER ONLY)
Abs Immature Granulocytes: 0.13 10*3/uL — ABNORMAL HIGH (ref 0.00–0.07)
Basophils Absolute: 0 10*3/uL (ref 0.0–0.1)
Basophils Relative: 0 %
Eosinophils Absolute: 0 10*3/uL (ref 0.0–0.5)
Eosinophils Relative: 0 %
HCT: 27 % — ABNORMAL LOW (ref 39.0–52.0)
Hemoglobin: 7.8 g/dL — ABNORMAL LOW (ref 13.0–17.0)
Immature Granulocytes: 1 %
Lymphocytes Relative: 42 %
Lymphs Abs: 3.9 10*3/uL (ref 0.7–4.0)
MCH: 27.7 pg (ref 26.0–34.0)
MCHC: 28.9 g/dL — ABNORMAL LOW (ref 30.0–36.0)
MCV: 95.7 fL (ref 80.0–100.0)
Monocytes Absolute: 1.4 10*3/uL — ABNORMAL HIGH (ref 0.1–1.0)
Monocytes Relative: 15 %
Neutro Abs: 4 10*3/uL (ref 1.7–7.7)
Neutrophils Relative %: 42 %
Platelet Count: 51 10*3/uL — ABNORMAL LOW (ref 150–400)
RBC: 2.82 MIL/uL — ABNORMAL LOW (ref 4.22–5.81)
RDW: 20.3 % — ABNORMAL HIGH (ref 11.5–15.5)
WBC Count: 9.4 10*3/uL (ref 4.0–10.5)
nRBC: 1.1 % — ABNORMAL HIGH (ref 0.0–0.2)

## 2023-05-01 LAB — PREPARE RBC (CROSSMATCH)

## 2023-05-01 MED ORDER — PROCHLORPERAZINE MALEATE 10 MG PO TABS: 10.0000 mg | ORAL_TABLET | Freq: Four times a day (QID) | ORAL | 1 refills | Status: DC | PRN

## 2023-05-01 MED ORDER — ONDANSETRON HCL 8 MG PO TABS: 8.0000 mg | ORAL_TABLET | Freq: Three times a day (TID) | ORAL | 1 refills | Status: DC | PRN

## 2023-05-01 MED ORDER — ALLOPURINOL 300 MG PO TABS: 300.0000 mg | ORAL_TABLET | Freq: Every day | ORAL | 2 refills | Status: DC

## 2023-05-01 NOTE — Progress Notes (Signed)
 Hematology/Oncology Consult Note [facility]  Telephone:([phone] [phone]  Patient Care Team: Corky Downs, MD as PCP - General (Internal Medicine) Creig Hines, MD as Consulting Physician (Oncology)   Name of the patient: Robert [name]  [id number]  [birth year]   Date of visit: 05/01/23  Diagnosis-new diagnosis of AML  Chief complaint/ Reason for visit- discuss bone marrow biopsy results and further management  Heme/Onc history: Patient is a 78 year old male with past medical history significant for hypertension hyperlipidemia type 2 diabetes peripheral arterial disease who is currently admitted to the hospital for symptoms of recurrent UTI and ESBL sepsis.  He was seen by me in January 2025 for posthospital discharge follow-up for pancytopenia.His hemoglobin from last year was 13 and had dropped down to 8.5 in June 2024. At that time his ferritin levels were normal at 121 but iron saturation low at 17% with a normal TIBC. Subsequently his iron saturation dropped to 13%. B12 levels and folate levels have been normal. We do not have any hemoglobin levels between last year and this year.  His anemia at that time was attributed to chronic disease but as planning to get a bone marrow biopsy down the line if the results of peripheral blood work did not reveal any discerning etiology. B12 level, TSH and haptoglobin at that time were normal.  Myeloma panel showed polyclonal increase in immunoglobulins but no monoclonal protein.  Serum free light chains showed both kappa and lambda light chain elevation but total ratio was normal.  Folate mildly low at 5.7.  Ferritin levels elevated at 1439   He underwent a colonoscopy by Dr. Timothy Lasso on 08/21/2022 due to concerns of anemia and he was found to have a 5 cm sigmoid mass which was biopsied and was consistent with intramucosal adenocarcinoma with focal area suspicious for at least superficial invasion into the  muscularis mucosa. There was another flat lesion noted in the ascending colon which was not biopsied but was concerning in appearance. 3 other polyps in the ascending colon positive for tubular adenoma. Patient underwent Sigmoid colon partial colectomy on 01/01/2023 at [facility].  Final pathology showed invasive adenocarcinoma moderately differentiated with negative margins.  15 lymph nodes negative for malignancy.  pT1 N0   Given his persistent pancytopenia patient underwent bone marrow Biopsy which was consistent with acute myeloid leukemia with total blasts ranging between 20 to 30%.  Overall bone marrow was hypercellular 75% with significantly left shifted both myeloid and erythroid lineages.Morphologically difficult to a certain definitive blasts from immature precursors.  Greater than 20% were identified however on aspirate smear slides, 17% by flow cytometry and overall 30% by CD34 IHC.  Significantly increased CD1 1 7 positive immature precursors.  Findings consistent with AML.  This could be AML arising from MDS.  [facility] 04/24/23-  SNVs/Indels: ASXL1, IDH2, SRSF2, STAG2.  RNA Fusions: none CNVs: none Pertinent negative: no alterations detected in CALR, FLT3, IDH1, JAK2, MPL, NPM1, TP53  Interval history- Robert [name] is a 78 y.o. male newly diagnosed with AML presents to clinic for discussion of treatment options.  He has now been discharged from rehab and transition to long-term care.  He has completed IV antibiotics for EBSL.  He is working with therapy daily and feels that he is getting stronger.  Is unable to ambulate but is standing at times.  He has had some increased dizziness over the past couple of days and generalized weakness. Ongoing bruising.   ECOG PS- 3 Pain scale-  0   Review of systems- Review of Systems  Constitutional:  Positive for malaise/fatigue. Negative for chills, fever and weight loss.  HENT:  Negative for congestion, ear discharge and nosebleeds.   Eyes:   Negative for blurred vision.  Respiratory:  Positive for shortness of breath. Negative for cough, hemoptysis, sputum production and wheezing.   Cardiovascular:  Negative for chest pain, palpitations, orthopnea and claudication.  Gastrointestinal:  Negative for abdominal pain, blood in stool, constipation, diarrhea, heartburn, melena, nausea and vomiting.  Genitourinary:  Negative for dysuria, flank pain, frequency, hematuria and urgency.  Musculoskeletal:  Negative for back pain, falls, joint pain and myalgias.  Skin:  Negative for rash.  Neurological:  Positive for dizziness and weakness. Negative for tingling, focal weakness, seizures and headaches.  Endo/Heme/Allergies:  Bruises/bleeds easily.  Psychiatric/Behavioral:  Negative for depression and suicidal ideas. The patient does not have insomnia.     No Known Allergies  Past Medical History:  Diagnosis Date   Allergy    Cellulitis    Diabetes mellitus without complication (HCC)    Hyperlipidemia    Hypertension    Peripheral vascular disease [facility])    Past Surgical History:  Procedure Laterality Date   BIOPSY  08/21/2022   Procedure: BIOPSY;  Surgeon: [name], DO;  Location: [facility] ENDOSCOPY;  Service: Gastroenterology;;   COLONOSCOPY WITH PROPOFOL N/A 10/02/2017   Procedure: COLONOSCOPY WITH PROPOFOL;  Surgeon: Wyline Mood, MD;  Location: [facility] ENDOSCOPY;  Service: Gastroenterology;  Laterality: N/A;   COLONOSCOPY WITH PROPOFOL N/A 08/21/2022   Procedure: COLONOSCOPY WITH PROPOFOL;  Surgeon: [name], DO;  Location: [facility] ENDOSCOPY;  Service: Gastroenterology;  Laterality: N/A;   ESOPHAGOGASTRODUODENOSCOPY (EGD) WITH PROPOFOL N/A 08/11/2022   Procedure: ESOPHAGOGASTRODUODENOSCOPY (EGD) WITH PROPOFOL;  Surgeon: Midge Minium, MD;  Location: [facility] ENDOSCOPY;  Service: Endoscopy;  Laterality: N/A;   HERNIA REPAIR     IR BONE MARROW BIOPSY & ASPIRATION  04/10/2023   PERICARDIOCENTESIS N/A 08/29/2022   Procedure:  PERICARDIOCENTESIS;  Surgeon: Yvonne Kendall, MD;  Location: [facility] INVASIVE CV LAB;  Service: Cardiovascular;  Laterality: N/A;   POLYPECTOMY  08/21/2022   Procedure: POLYPECTOMY;  Surgeon: [name], DO;  Location: [facility] ENDOSCOPY;  Service: Gastroenterology;;   Social History   Socioeconomic History   Marital status: Married    Spouse name: Not on file   Number of children: 0   Years of education: College Gradute   Highest education level: Bachelor's degree (e.g., BA, AB, BS)  Occupational History   Not on file  Tobacco Use   Smoking status: Never   Smokeless tobacco: Never  Vaping Use   Vaping status: Never Used  Substance and Sexual Activity   Alcohol use: Not Currently    Comment: Occasionally   Drug use: No   Sexual activity: Not Currently  Other Topics Concern   Not on file  Social History Narrative   Not on file   Social Drivers of Health   Financial Resource Strain: Low Risk  (12/15/2022)   Received from [facility]   Overall Financial Resource Strain (CARDIA)    Difficulty of Paying Living Expenses: Not hard at all  Food Insecurity: Patient Unable To Answer (04/04/2023)   Hunger Vital Sign    Worried About Running Out of Food in the Last Year: Patient unable to answer    Ran Out of Food in the Last Year: Patient unable to answer  Transportation Needs: Patient Unable To Answer (04/04/2023)   PRAPARE - Transportation  Lack of Transportation (Medical): Patient unable to answer    Lack of Transportation (Non-Medical): Patient unable to answer  Physical Activity: Inactive (10/29/2020)   Exercise Vital Sign    Days of Exercise per Week: 0 days    Minutes of Exercise per Session: 0 min  Stress: No Stress Concern Present (10/29/2020)   [name] of Occupational Health - Occupational Stress Questionnaire    Feeling of Stress : Not at all  Social Connections: Patient Unable To Answer (04/04/2023)   Social Connection and Isolation Panel  [NHANES]    Frequency of Communication with Friends and Family: Patient unable to answer    Frequency of Social Gatherings with Friends and Family: Patient unable to answer    Attends Religious Services: Patient unable to answer    Active Member of Clubs or Organizations: Patient unable to answer    Attends Banker Meetings: Patient unable to answer    Marital Status: Patient unable to answer  Intimate Partner Violence: Not At Risk (04/04/2023)   Humiliation, Afraid, Rape, and Kick questionnaire    Fear of Current or Ex-Partner: No    Emotionally Abused: No    Physically Abused: No    Sexually Abused: No   Family History  Problem Relation Age of Onset   Heart disease Mother    Stroke Mother    Heart disease Father    Heart attack Father    Current Outpatient Medications:    BD PEN NEEDLE NANO 2ND GEN 32G X 4 MM MISC, USE 1 PEN NEEDLE EVERY DAY AS DIRECTED, Disp: 100 each, Rfl: 4   bisacodyl (DULCOLAX) 10 MG suppository, Place 1 suppository (10 mg total) rectally daily as needed for moderate constipation., Disp: 8 suppository, Rfl: 0   DULoxetine (CYMBALTA) 60 MG capsule, Take 1 capsule (60 mg total) by mouth 2 (two) times daily., Disp: 14 capsule, Rfl: 0   ferrous sulfate 325 (65 FE) MG tablet, Take 1 tablet (325 mg total) by mouth 2 (two) times daily with a meal for 14 days., Disp: 28 tablet, Rfl: 0   furosemide (LASIX) 20 MG tablet, Take 1 tablet (20 mg total) by mouth daily. Increase to 1 tablet (20 mg total) by mouth TWICE daily (total daily dose 40 mg) as needed for up to 3 days for increased leg swelling, shortness of breath, weight gain 5+ lbs over 1-2 days. Seek medical care if these symptoms are not improving with increased dose., Disp: 14 tablet, Rfl: 0   hydrOXYzine (ATARAX) 25 MG tablet, Take 1 tablet (25 mg total) by mouth 3 (three) times daily as needed for itching., Disp: 10 tablet, Rfl: 0   latanoprost (XALATAN) 0.005 % ophthalmic solution, Place 1 drop into  both eyes at bedtime., Disp: 2.5 mL, Rfl: 0   melatonin 5 MG TABS, Take 1 tablet (5 mg total) by mouth at bedtime., Disp: 7 tablet, Rfl: 0   metoprolol succinate (TOPROL-XL) 50 MG 24 hr tablet, Take 1 tablet (50 mg total) by mouth daily. Take with or immediately following a meal., Disp: 7 tablet, Rfl: 0   mirtazapine (REMERON) 15 MG tablet, Take 1 tablet (15 mg total) by mouth daily., Disp: 7 tablet, Rfl: 0   ondansetron (ZOFRAN) 4 MG tablet, Take 1 tablet (4 mg total) by mouth every 6 (six) hours as needed for nausea., Disp: 20 tablet, Rfl: 0   oxybutynin (DITROPAN-XL) 10 MG 24 hr tablet, Take 1 tablet (10 mg total) by mouth every evening., Disp: 7 tablet, Rfl:  0   pantoprazole (PROTONIX) 40 MG tablet, Take 1 tablet (40 mg total) by mouth 2 (two) times daily before a meal., Disp: 14 tablet, Rfl: 0   polyethylene glycol powder (GLYCOLAX/MIRALAX) 17 GM/SCOOP powder, Take 17 g by mouth daily., Disp: 238 g, Rfl: 0   senna-docusate (FT STOOL SOFTENER) 8.6-50 MG tablet, Take 2 tablets by mouth daily as needed for mild constipation., Disp: 14 tablet, Rfl: 0   simvastatin (ZOCOR) 20 MG tablet, Take 1 tablet (20 mg total) by mouth daily., Disp: 7 tablet, Rfl: 0   sodium phosphate (FLEET) ENEM, Place 133 mLs (1 enema total) rectally daily as needed for severe constipation., Disp: 133 mL, Rfl: 0   allopurinol (ZYLOPRIM) 300 MG tablet, Take 1 tablet (300 mg total) by mouth daily., Disp: 30 tablet, Rfl: 2   ondansetron (ZOFRAN) 8 MG tablet, Take 1 tablet (8 mg total) by mouth every 8 (eight) hours as needed for nausea or vomiting., Disp: 30 tablet, Rfl: 1   prochlorperazine (COMPAZINE) 10 MG tablet, Take 1 tablet (10 mg total) by mouth every 6 (six) hours as needed for nausea or vomiting., Disp: 30 tablet, Rfl: 1  Physical exam:  Vitals:   05/01/23 1007  BP: 109/80  Pulse: (!) 136  Resp: 19  Temp: 97.6 F (36.4 C)  SpO2: 96%   Physical Exam Vitals reviewed.  Constitutional:      Appearance: He is  obese. He is ill-appearing.  Cardiovascular:     Rate and Rhythm: Normal rate and regular rhythm.  Pulmonary:     Effort: Pulmonary effort is normal.     Breath sounds: Normal breath sounds.  Abdominal:     General: There is no distension.     Palpations: Abdomen is soft.     Tenderness: There is no abdominal tenderness.  Musculoskeletal:     Right lower leg: Edema present.     Left lower leg: Edema present.  Skin:    General: Skin is warm and dry.     Coloration: Skin is pale. Skin is not jaundiced.     Findings: Bruising present.  Neurological:     Mental Status: He is alert and oriented to person, place, and time.  Psychiatric:        Mood and Affect: Mood normal.        Behavior: Behavior normal.       Latest Ref Rng & Units 04/12/2023    5:03 AM  CMP  Glucose 70 - 99 mg/dL 95   BUN 8 - 23 mg/dL 31   Creatinine 1.61 - 1.24 mg/dL 0.96   Sodium 045 - 409 mmol/L 139   Potassium 3.5 - 5.1 mmol/L 3.9   Chloride 98 - 111 mmol/L 102   CO2 22 - 32 mmol/L 27   Calcium 8.9 - 10.3 mg/dL 8.1       Latest Ref Rng & Units 05/01/2023    9:32 AM  CBC  WBC 4.0 - 10.5 K/uL 9.4   Hemoglobin 13.0 - 17.0 g/dL 7.8   Hematocrit 81.1 - 52.0 % 27.0   Platelets 150 - 400 K/uL 51    No images are attached to the encounter.  IR BONE MARROW BIOPSY & ASPIRATION Result Date: 04/10/2023 INDICATION: 78 year old male with pancytopenia EXAM: FLUOROSCOPICALLY GUIDED BONE MARROW ASPIRATION AND CORE BIOPSY Interventional Radiologist:  Sterling Big, MD MEDICATIONS: None. ANESTHESIA/SEDATION: Moderate (conscious) sedation was employed during this procedure. A total of 1 milligrams versed and 50 micrograms fentanyl were administered intravenously.  The patient's level of consciousness and vital signs were monitored continuously by radiology nursing throughout the procedure under my direct supervision. Total monitored sedation time: 10 minutes FLUOROSCOPY: RADIATION EXPOSURE INDEX: 5.6 MGY REFERENCE  AIR KERMA COMPLICATIONS: None immediate. Estimated blood loss: <25 mL PROCEDURE: Informed written consent was obtained from the patient after a thorough discussion of the procedural risks, benefits and alternatives. All questions were addressed. Maximal Sterile Barrier Technique was utilized including caps, mask, sterile gowns, sterile gloves, sterile drape, hand hygiene and skin antiseptic. A timeout was performed prior to the initiation of the procedure. The patient was positioned prone and fluoroscopy was performed of the pelvis to demonstrate the iliac marrow spaces. Maximal barrier sterile technique utilized including caps, mask, sterile gowns, sterile gloves, large sterile drape, hand hygiene, and betadine prep. Under sterile conditions and local anesthesia, an 11 gauge coaxial bone biopsy needle was advanced into the left iliac marrow space. Needle position was confirmed with fluoroscopic imaging. Initially, bone marrow aspiration was performed. Next, the 11 gauge outer cannula was utilized to obtain a left iliac bone marrow core biopsy. Needle was removed. Hemostasis was obtained with compression. The patient tolerated the procedure well. Samples were prepared with the cytotechnologist. IMPRESSION: Successful image guided left bone marrow aspirate and core biopsy. Electronically Signed   By: Malachy Moan M.D.   On: 04/10/2023 10:09   CT RENAL STONE STUDY Result Date: 04/06/2023 CLINICAL DATA:  Flank pain, colon cancer status post partial colectomy EXAM: CT ABDOMEN AND PELVIS WITHOUT CONTRAST TECHNIQUE: Multidetector CT imaging of the abdomen and pelvis was performed following the standard protocol without IV contrast. RADIATION DOSE REDUCTION: This exam was performed according to the departmental dose-optimization program which includes automated exposure control, adjustment of the mA and/or kV according to patient size and/or use of iterative reconstruction technique. COMPARISON:  08/18/2022  FINDINGS: Lower chest: Small bilateral pleural effusions with right lower lobe atelectasis or consolidation. Aortic and coronary artery atherosclerosis. Hepatobiliary: 4.6 cm cyst in the left hepatic lobe. No new liver abnormality identified on noncontrast images. Cholelithiasis. No pericholecystic inflammatory changes are evident. No biliary dilatation. Pancreas: Unremarkable. No pancreatic ductal dilatation or surrounding inflammatory changes. Spleen: Normal in size without focal abnormality. Adrenals/Urinary Tract: 4.9 cm mixed attenuation right adrenal gland mass which contains macroscopic fat (previously measured 4.5 cm). Unremarkable left adrenal gland. Severely atrophic left kidney. Rounded 3.7 cm low-attenuation lesion within the interpolar region of the right kidney with possible wall thickening. A simple 2.0 cm cyst was noted at this location on the prior study. No renal stone or hydronephrosis. Tiny stones and or a small amount of debris within the bladder. Urinary bladder otherwise unremarkable. Stomach/Bowel: Small hiatal hernia. Stomach is within normal limits. No dilated loops of bowel. Interval partial sigmoid colon resection. No focal bowel wall thickening or inflammatory changes. Vascular/Lymphatic: Slight progression of retroperitoneal lymphadenopathy. Reference nodes include 1.4 cm retrocaval node (series 2, image 37), previously 0.6 cm. 1.5 cm left external iliac chain node (series 2, image 74), previously 1.3 cm. Aortoiliac atherosclerosis. Reproductive: Prostate is unremarkable. Other: Trace free fluid.  No fluid collections or pneumoperitoneum. Musculoskeletal: No acute or significant osseous findings. IMPRESSION: 1. Rounded 3.7 cm low-attenuation lesion within the interpolar region of the right kidney with possible wall thickening. A simple 2.0 cm cyst was noted at this location on the prior study. Cystic neoplasm or renal abscess not excluded. Correlation with urinalysis is recommended.  Consider follow-up contrast enhanced renal protocol CT of the abdomen. 2. Small  bilateral pleural effusions with right lower lobe atelectasis or consolidation. 3. Slight progression of retroperitoneal lymphadenopathy, which may be reactive or metastatic. 4. Cholelithiasis without evidence of acute cholecystitis. 5. Tiny stones and/or a small amount of debris within the bladder. 6. Slight interval enlargement of a 4.9 cm mixed attenuation right adrenal gland mass which contains macroscopic fat. This is favored to represent an adrenal myelolipoma. Follow-up nonemergent adrenal protocol CT or MRI is recommended. 7. Aortic atherosclerosis (ICD10-I70.0). Electronically Signed   By: Duanne Guess D.O.   On: 04/06/2023 15:35   ECHOCARDIOGRAM COMPLETE Result Date: 04/04/2023    ECHOCARDIOGRAM REPORT   Patient Name:   Robert [name] Date of Exam: 04/04/2023 Medical Rec #:  [id number]          Height:       74.0 in Accession #:    [id number]         Weight:       210.1 lb Date of Birth:  March 17, 1945         BSA:          2.219 m Patient Age:    34 years           BP:           107/71 mmHg Patient Gender: M                  HR:           130 bpm. Exam Location:  [facility] Procedure: 2D Echo, Cardiac Doppler and Color Doppler (Both Spectral and Color            Flow Doppler were utilized during procedure). Indications:     CHF I50.21  History:         Patient has prior history of Echocardiogram examinations, most                  recent 12/22/2022. HFpEF and CHF, Arrythmias:Tachycardia; Risk                  Factors:Non-Smoker, Hypertension, Diabetes and Dyslipidemia.  Sonographer:     Dondra Prader RVT RCS Referring Phys:  [id number] Emeline General Diagnosing Phys: Alwyn Pea MD  Sonographer Comments: Elevated HR 130's bpm; Limited and poor windows therefore unable to use definity. IMPRESSIONS  1. Left ventricular ejection fraction, by estimation, is 45 to 50%. The left ventricle has mildly decreased function. The left  ventricle has no regional wall motion abnormalities. Left ventricular diastolic parameters were normal.  2. Right ventricular systolic function is normal. The right ventricular size is normal.  3. The mitral valve is normal in structure. No evidence of mitral valve regurgitation.  4. The aortic valve is normal in structure. Aortic valve regurgitation is not visualized. FINDINGS  Left Ventricle: Left ventricular ejection fraction, by estimation, is 45 to 50%. The left ventricle has mildly decreased function. The left ventricle has no regional wall motion abnormalities. Strain imaging was not performed. The left ventricular internal cavity size was normal in size. There is no left ventricular hypertrophy. Left ventricular diastolic parameters were normal. Right Ventricle: The right ventricular size is normal. No increase in right ventricular wall thickness. Right ventricular systolic function is normal. Left Atrium: Left atrial size was normal in size. Right Atrium: Right atrial size was normal in size. Pericardium: There is no evidence of pericardial effusion. Mitral Valve: The mitral valve is normal in structure. No evidence of mitral valve regurgitation. Tricuspid Valve: The tricuspid valve  is normal in structure. Tricuspid valve regurgitation is trivial. Aortic Valve: The aortic valve is normal in structure. Aortic valve regurgitation is not visualized. Aortic valve mean gradient measures 3.0 mmHg. Aortic valve peak gradient measures 5.5 mmHg. Aortic valve area, by VTI measures 3.00 cm. Pulmonic Valve: The pulmonic valve was not well visualized. Pulmonic valve regurgitation is not visualized. Aorta: The ascending aorta was not well visualized. IAS/Shunts: No atrial level shunt detected by color flow Doppler. Additional Comments: 3D imaging was not performed.  LEFT VENTRICLE PLAX 2D LVIDd:         4.60 cm   Diastology LVIDs:         3.50 cm   LV e' medial: 12.30 cm/s LV PW:         1.10 cm LV IVS:        0.90 cm  LVOT diam:     2.10 cm LV SV:         43 LV SV Index:   19 LVOT Area:     3.46 cm  RIGHT VENTRICLE             IVC RV S prime:     13.40 cm/s  IVC diam: 1.60 cm TAPSE (M-mode): 1.4 cm LEFT ATRIUM         Index LA diam:    3.20 cm 1.44 cm/m  AORTIC VALVE                    PULMONIC VALVE AV Area (Vmax):    2.95 cm     PV Vmax:       0.82 m/s AV Area (Vmean):   2.91 cm     PV Peak grad:  2.7 mmHg AV Area (VTI):     3.00 cm AV Vmax:           117.00 cm/s AV Vmean:          78.400 cm/s AV VTI:            0.142 m AV Peak Grad:      5.5 mmHg AV Mean Grad:      3.0 mmHg LVOT Vmax:         99.80 cm/s LVOT Vmean:        65.900 cm/s LVOT VTI:          0.123 m LVOT/AV VTI ratio: 0.87  AORTA Ao Root diam: 4.00 cm  SHUNTS Systemic VTI:  0.12 m Systemic Diam: 2.10 cm Alwyn Pea MD Electronically signed by Alwyn Pea MD Signature Date/Time: 04/04/2023/8:30:26 PM    Final    DG Chest 1 View Result Date: 04/04/2023 CLINICAL DATA:  78 year old male with history of congestive heart failure. EXAM: CHEST  1 VIEW COMPARISON:  Chest x-ray 04/03/2023. FINDINGS: Lung volumes are low. There is cephalization of the pulmonary vasculature and slight indistinctness of the interstitial markings suggestive of mild pulmonary edema. Small bilateral pleural effusions. Mild cardiomegaly. The patient is rotated to the left on today's exam, resulting in distortion of the mediastinal contours and reduced diagnostic sensitivity and specificity for mediastinal pathology. Atherosclerotic calcifications are noted in the thoracic aorta. IMPRESSION: 1. The appearance the chest is most suggestive of congestive heart failure, as above. 2. Aortic atherosclerosis. Electronically Signed   By: Trudie Reed M.D.   On: 04/04/2023 07:02   DG Foot 2 Views Right Result Date: 04/03/2023 CLINICAL DATA:  Osteomyelitis.  Fall. EXAM: RIGHT FOOT - 2 VIEW COMPARISON:  None Available. FINDINGS: Large  plantar calcaneal spur. Atherosclerotic vascular  calcifications. Degenerative spurring in the midfoot. The MTP joints are hyper extended with flexion of the distal interphalangeal joints suggesting second through fifth hammertoe deformities. As result of this, the phalanges are poorly characterized on the AP projection, with the x-ray projection oriented along or oblique to the long axis of the proximal phalanges. Interphalangeal osteoarthritis with loss of articular space and spurring. No malalignment or well-defined fracture. Type 1 accessory navicular. Spurring of the first digit sesamoids. Mild corticated lateral erosion of the head of the fifth metatarsal. IMPRESSION: 1. No malalignment or well-defined fracture. 2. Large plantar calcaneal spur. 3. Atherosclerotic vascular calcifications. 4. Hammertoe deformities. 5. Osteoarthritis. 6. Mild corticated lateral erosion of the head of the fifth metatarsal. This is nonspecific but could be seen with gout or rheumatoid arthropathy. Electronically Signed   By: Gaylyn Rong M.D.   On: 04/03/2023 10:33   DG Chest Port 1 View Result Date: 04/03/2023 CLINICAL DATA:  78 year old male with history of altered mental status and fall. EXAM: PORTABLE CHEST 1 VIEW COMPARISON:  Chest x-ray 03/17/2023. FINDINGS: Opacity at the right base which may reflect atelectasis and/or consolidation, with superimposed small right pleural effusion. Left lung is clear. No definite left pleural effusion. No pneumothorax. No evidence of pulmonary edema. Heart size appears borderline enlarged. The patient is rotated to the right on today's exam, resulting in distortion of the mediastinal contours and reduced diagnostic sensitivity and specificity for mediastinal pathology. Atherosclerotic calcifications are noted in the thoracic aorta. IMPRESSION: 1. Atelectasis and/or consolidation in the right lower lobe with small right pleural effusion. 2. Aortic atherosclerosis. Electronically Signed   By: Trudie Reed M.D.   On: 04/03/2023  06:43   CT Head Wo Contrast Result Date: 04/03/2023 CLINICAL DATA:  78 year old male with history of trauma from a fall. Altered mental status. EXAM: CT HEAD WITHOUT CONTRAST TECHNIQUE: Contiguous axial images were obtained from the base of the skull through the vertex without intravenous contrast. RADIATION DOSE REDUCTION: This exam was performed according to the departmental dose-optimization program which includes automated exposure control, adjustment of the mA and/or kV according to patient size and/or use of iterative reconstruction technique. COMPARISON:  Head CT 03/17/2023. FINDINGS: Brain: Mild cerebral atrophy. Patchy and confluent areas of decreased attenuation are noted throughout the deep and periventricular white matter of the cerebral hemispheres bilaterally, compatible with chronic microvascular ischemic disease. No evidence of acute infarction, hemorrhage, hydrocephalus, extra-axial collection or mass lesion/mass effect. Vascular: No hyperdense vessel. Atherosclerotic calcifications are again noted in the internal carotid arteries bilaterally and in the vertebral arteries bilaterally. Skull: Normal. Negative for fracture or focal lesion. Sinuses/Orbits: Multifocal mucosal thickening throughout the paranasal sinuses bilaterally, slightly improved compared to the prior examination with resolution of previously noted air-fluid levels. Other: Large left mastoid effusion, unchanged. IMPRESSION: 1. No evidence of significant acute traumatic injury to the skull or brain. 2. Mild cerebral atrophy with chronic microvascular ischemic changes in the cerebral white matter, as above. 3. Chronic paranasal sinus disease, slightly improved compared to the prior study. 4. Large left mastoid effusion, unchanged. Electronically Signed   By: Trudie Reed M.D.   On: 04/03/2023 06:41     Assessment and plan- Patient is a 78 y.o. male  AML- newly diagnosed 03/2023. Bone marrow biopsy from 04/10/2023 is  consistent with a male probably arising from pre-existing AML. Total blast count was 20% and 2% by flow cytometry, and 30% by CD34 IHC. [employer] revealed alterations of ASXL1, IDH2, SRSF2,  STAG2. No alterations of CALR, FLT3, IDH1, JAK2, MPL, NPM1, TP53.  We discussed untreated AML carries a poor prognosis of mutations are also associated with for prognosis. Generally with untreated AML currently 3 to 6 months. He previously met with Dr. Smith Robert and discussed options including hypomethylating agent. He is now off -IV antibiotics for ESBL UTI and has transitioned to long-term care, residing at [facility]. Dr. Smith Robert recommends venetoclax + hypomethylating agent decitabine with metronomic dosing based on his performance status.  In this case to trial patient's received a once weekly dose of decitabine (0.2 mg/kg subcutaneously) and 1 dose of venetoclax 400 mg orally on days 1, 8, 15, and 22 for 28-day cycle. The induction.  Consisted of 3 cycles (12 weeks), followed by weekly maintenance. Bone marrow was assessed every 12 weeks. The primary endpoint of being able to continue treatment without dose interruptions or delays was achieved in 90% of patients. In terms of efficacy, the CR for AML patients was 58% as compared to 55% in VIALE-A trial, the median OS was 16.1 months as compared to 14.7 months in VIALE-A trial. Potential improvement in OS thought to be related to decreased in treatment related morbidity and mortality with a non-cytotoxic approach.                          Since this patient has had several infections recently, Dr. Assunta Gambles preference is to avoid a port-a-cath and proceed with subcutaneous dosing of decitabine. Patient is in agreement. We also discussed that he would need to come to clinic weekly for injection and weekly blood counts with possible transfusions. We discussed risks and benefits including but not limited to nausea, vomiting, low blood counts, and risk of infections. At patient  request I also spoke to his wife by phone (40 minutes) reviewing diagnosis, plan, risks, benefits, alternatives.  I have entered treatment plan on behalf of Dr Smith Robert. Discussed with Dr Orlie Dakin who will sign in her absence. I have printed prescriptions for allopurinol 300 mg daily for prophylaxis. Also reviewed use of zofran and compazine for nausea/vomiting and provided with paper prescriptions. He will take these to [facility] to fill & dispense. I spoke with Lisette Grinder who will coordinate authorization, prescription, and fill of venetoclax to patient.   Symptomatic Anemia- hmg 7.3. Secondary to AML. Symptomatic. Plan for 1 unit of irradiated pRBCs tomorrow.  Thrombocytopenia- plt 51. Hold off on transfusion for now.  Goals of care- we discussed poor prognosis with his disease. He understands that treatment is palliative and may not result in cure. We discussed option of treatment today vs best supportive care/hospice and he would like to proceed with treatment. We will have him followed by palliative care.   Disposition:  1 unit pRBCs tomorrow Chemo class 1 week- lab (cbc, cmp, hold tube), Dr Smith Robert, subcutaneous decitabine D2- possible transfusion- la  Visit Diagnosis 1. Acute myeloid leukemia not having achieved remission (HCC)   2. Symptomatic anemia   3. Thrombocytopenia (HCC)   4. Goals of care, counseling/discussion     I provided 180 minutes of both face to face and non face to face time during this encounter, and > 50% was spent counseling as documented under my assessment & plan.   Consuello Masse, DNP, AGNP-C, [facility] at [facility] [phone] (clinic) 05/01/2023

## 2023-05-01 NOTE — Progress Notes (Signed)
 Clinical Pharmacist Practitioner Clinic [facility]  Telephone:([phone] [phone]  Patient Care Team: Corky Downs, MD as PCP - General (Internal Medicine) Creig Hines, MD as Consulting Physician (Oncology)   Name of the patient: Robert [name]  [id number]  [birth year]   Date of visit: 05/01/23  HPI: Patient is a 78 y.o. male with AML. Planned treatment with azacitidine and venetoclax using metronomic, low-dose decitabine with venetoclax regimen administered weekly.   Reason for Consult: Venetoclax oral chemotherapy education.   PAST MEDICAL HISTORY: Past Medical History:  Diagnosis Date   Allergy    Cellulitis    Diabetes mellitus without complication (HCC)    Hyperlipidemia    Hypertension    Peripheral vascular disease [facility])     HEMATOLOGY/ONCOLOGY HISTORY:  Oncology History  Malignant neoplasm of sigmoid colon (HCC)  08/25/2022 Initial Diagnosis   Malignant neoplasm of sigmoid colon (HCC)   03/21/2023 Cancer Staging   Staging form: Colon and Rectum, AJCC 8th Edition - Pathologic stage from 03/21/2023: Stage I (pT1, pN0, cM0) - Signed by Creig Hines, MD on 03/21/2023 Stage prefix: Initial diagnosis Total positive nodes: 0     ALLERGIES:  has no known allergies.  MEDICATIONS:  Current Outpatient Medications  Medication Sig Dispense Refill   BD PEN NEEDLE NANO 2ND GEN 32G X 4 MM MISC USE 1 PEN NEEDLE EVERY DAY AS DIRECTED 100 each 4   bisacodyl (DULCOLAX) 10 MG suppository Place 1 suppository (10 mg total) rectally daily as needed for moderate constipation. 8 suppository 0   DULoxetine (CYMBALTA) 60 MG capsule Take 1 capsule (60 mg total) by mouth 2 (two) times daily. 14 capsule 0   ferrous sulfate 325 (65 FE) MG tablet Take 1 tablet (325 mg total) by mouth 2 (two) times daily with a meal for 14 days. 28 tablet 0   furosemide (LASIX) 20 MG tablet Take 1 tablet (20 mg total) by mouth daily. Increase to 1 tablet (20 mg total) by  mouth TWICE daily (total daily dose 40 mg) as needed for up to 3 days for increased leg swelling, shortness of breath, weight gain 5+ lbs over 1-2 days. Seek medical care if these symptoms are not improving with increased dose. 14 tablet 0   hydrOXYzine (ATARAX) 25 MG tablet Take 1 tablet (25 mg total) by mouth 3 (three) times daily as needed for itching. 10 tablet 0   latanoprost (XALATAN) 0.005 % ophthalmic solution Place 1 drop into both eyes at bedtime. 2.5 mL 0   melatonin 5 MG TABS Take 1 tablet (5 mg total) by mouth at bedtime. 7 tablet 0   metoprolol succinate (TOPROL-XL) 50 MG 24 hr tablet Take 1 tablet (50 mg total) by mouth daily. Take with or immediately following a meal. 7 tablet 0   mirtazapine (REMERON) 15 MG tablet Take 1 tablet (15 mg total) by mouth daily. 7 tablet 0   ondansetron (ZOFRAN) 4 MG tablet Take 1 tablet (4 mg total) by mouth every 6 (six) hours as needed for nausea. 20 tablet 0   oxybutynin (DITROPAN-XL) 10 MG 24 hr tablet Take 1 tablet (10 mg total) by mouth every evening. 7 tablet 0   pantoprazole (PROTONIX) 40 MG tablet Take 1 tablet (40 mg total) by mouth 2 (two) times daily before a meal. 14 tablet 0   polyethylene glycol powder (GLYCOLAX/MIRALAX) 17 GM/SCOOP powder Take 17 g by mouth daily. 238 g 0   senna-docusate (FT STOOL SOFTENER) 8.6-50 MG tablet Take 2  tablets by mouth daily as needed for mild constipation. 14 tablet 0   simvastatin (ZOCOR) 20 MG tablet Take 1 tablet (20 mg total) by mouth daily. 7 tablet 0   sodium phosphate (FLEET) ENEM Place 133 mLs (1 enema total) rectally daily as needed for severe constipation. 133 mL 0   No current facility-administered medications for this visit.    VITAL SIGNS: There were no vitals taken for this visit. There were no vitals filed for this visit.  Estimated body mass index is 26.98 kg/m as calculated from the following:   Height as of 04/10/23: 6\' 2"  (1.88 m).   Weight as of 04/03/23: 95.3 kg (210 lb 1.6  oz).  LABS: CBC:    Component Value Date/Time   WBC 9.4 05/01/2023 0932   WBC 6.2 04/16/2023 1051   HGB 7.8 (L) 05/01/2023 0932   HCT 27.0 (L) 05/01/2023 0932   PLT 51 (L) 05/01/2023 0932   MCV 95.7 05/01/2023 0932   NEUTROABS 4.0 05/01/2023 0932   LYMPHSABS 3.9 05/01/2023 0932   MONOABS 1.4 (H) 05/01/2023 0932   EOSABS 0.0 05/01/2023 0932   BASOSABS 0.0 05/01/2023 0932   Comprehensive Metabolic Panel:    Component Value Date/Time   NA 139 04/12/2023 0503   K 3.9 04/12/2023 0503   CL 102 04/12/2023 0503   CO2 27 04/12/2023 0503   BUN 31 (H) 04/12/2023 0503   CREATININE 1.53 (H) 04/12/2023 0503   CREATININE 1.69 (H) 07/12/2021 1516   GLUCOSE 95 04/12/2023 0503   CALCIUM 8.1 (L) 04/12/2023 0503   AST 27 04/03/2023 0521   ALT 11 04/03/2023 0521   ALKPHOS 71 04/03/2023 0521   BILITOT 0.9 04/03/2023 0521   PROT 7.1 04/03/2023 0521   ALBUMIN <1.5 (L) 04/06/2023 0451     Present during today's visit: patient only  Start plan: Planned start 05/09/23, pending medication access   Patient Education I spoke with patient for overview of new oral chemotherapy medication: venetoclax   Of note, patient is a resident at [facility]. Spoke with nurse Crystal there about patient starting venetoclax next week.   CMP from 04/03/23 assessed, no relevant lab abnormalities. Prescription dose and frequency assessed.   Administration: Counseled patient on administration, dosing, side effects, monitoring, drug-food interactions, safe handling, storage, and disposal. Patient will take Take 4 tablets (400 mg total) by mouth once a week. Tablets should be swallowed whole with a meal and a full glass of water. .  Side Effects: Side effects include but not limited to: diarrhea, nausea, decreased wbc/hgb/plt, fatigue.    Drug-drug Interactions (DDI): No current DDIs with venetoclax  Adherence: After discussion with patient no patient barriers to medication adherence identified.   Reviewed with patient importance of keeping a medication schedule and plan for any missed doses.  [name] voiced understanding and appreciation. All questions answered. Medication handout provided.  Provided patient with Oral Chemotherapy Navigation Clinic phone number. Patient knows to call the office with questions or concerns. Oral Chemotherapy Navigation Clinic will continue to follow.  Patient expressed understanding and was in agreement with this plan. He also understands that He can call clinic at any time with any questions, concerns, or complaints.   Medication Access Issues: PA pending, but grant already obtained  Follow-up plan: RTC as scheduled  Thank you for allowing me to participate in the care of this patient.   Time Total: 15 mins  Visit consisted of counseling and education on dealing with issues of symptom management in  the setting of serious and potentially life-threatening illness.Greater than 50%  of this time was spent counseling and coordinating care related to the above assessment and plan.  Signed by: Remi Haggard, PharmD, Nolon Bussing, CPP Hematology/Oncology Clinical Pharmacist Practitioner [place] [phone]  05/01/2023 2:32 PM

## 2023-05-01 NOTE — Telephone Encounter (Signed)
 Oral Oncology Patient Advocate Encounter  Was successful in securing patient a $10,000 grant from [employer] to provide copayment coverage for Venclexta.  This will keep the out of pocket expense at $0.     [employer] ID: [id number]  The billing information is as follows and has been shared with [facility].    RxBin: [id number] PCN: [id number] Member ID: [id number] Group ID: [id number] Dates of Eligibility: 04/01/2023 through 03/30/2024  Fund:  Acute Myeloid Leukemia  Patty Almedia Balls, CPhT Oncology Pharmacy Patient Advocate [facility] Direct Number: [phone] Fax: [phone]

## 2023-05-01 NOTE — Telephone Encounter (Signed)
 Patient saw Robert Masse, NP today and his wife is request lauren to call her.

## 2023-05-01 NOTE — Progress Notes (Signed)
 START ON PATHWAY REGIMEN - AML and APL     A cycle is every 28 days:     Venetoclax      Decitabine   **Always confirm dose/schedule in your pharmacy ordering system**  Patient Characteristics: AML, Initial Induction Therapy, Not a Candidate for Intensive Induction Therapy or Lower Intensity Therapy Preferred, IDH2 Mutation Present Disease Subtype: AML Phase of Therapy: Initial Induction Therapy Preferred Therapy Intensity: Not a Candidate for Intensive Therapy Molecular Alteration Present: IDH2 Mutation Present Intent of Therapy: Non-Curative / Palliative Intent, Discussed with Patient

## 2023-05-01 NOTE — Telephone Encounter (Signed)
 Left voicemail for patient to come by scheduling desk tomorrow when he is at the clinic to get scheduled for Chemo class.

## 2023-05-02 ENCOUNTER — Other Ambulatory Visit: Payer: Self-pay

## 2023-05-02 ENCOUNTER — Other Ambulatory Visit (HOSPITAL_COMMUNITY): Payer: Self-pay

## 2023-05-02 ENCOUNTER — Telehealth: Payer: Self-pay | Admitting: Pharmacy Technician

## 2023-05-02 ENCOUNTER — Encounter: Payer: Self-pay | Admitting: Oncology

## 2023-05-02 ENCOUNTER — Encounter: Payer: Self-pay | Admitting: Nurse Practitioner

## 2023-05-02 ENCOUNTER — Inpatient Hospital Stay

## 2023-05-02 DIAGNOSIS — Z7189 Other specified counseling: Secondary | ICD-10-CM

## 2023-05-02 DIAGNOSIS — D649 Anemia, unspecified: Secondary | ICD-10-CM

## 2023-05-02 DIAGNOSIS — C9200 Acute myeloblastic leukemia, not having achieved remission: Secondary | ICD-10-CM | POA: Diagnosis not present

## 2023-05-02 LAB — PREPARE RBC (CROSSMATCH)

## 2023-05-02 MED ORDER — VENETOCLAX 100 MG PO TABS
400.0000 mg | ORAL_TABLET | ORAL | 0 refills | Status: DC
  Filled 2023-05-03: qty 16, 28d supply, fill #0

## 2023-05-02 MED ORDER — FUROSEMIDE 10 MG/ML IJ SOLN
20.0000 mg | Freq: Once | INTRAMUSCULAR | Status: AC
  Administered 2023-05-02: 20 mg via INTRAVENOUS
  Filled 2023-05-02: qty 2

## 2023-05-02 MED ORDER — SODIUM CHLORIDE 0.9% IV SOLUTION
250.0000 mL | INTRAVENOUS | Status: DC
  Administered 2023-05-02: 100 mL via INTRAVENOUS
  Filled 2023-05-02: qty 250

## 2023-05-02 MED ORDER — ACETAMINOPHEN 325 MG PO TABS
650.0000 mg | ORAL_TABLET | Freq: Once | ORAL | Status: AC
Start: 2023-05-02 — End: 2023-05-02
  Administered 2023-05-02: 650 mg via ORAL
  Filled 2023-05-02: qty 2

## 2023-05-02 NOTE — Telephone Encounter (Signed)
 Oral Oncology Patient Advocate Encounter   Received notification that prior authorization for Venclexta is required.   PA submitted on 05/02/2023 Key [id number] Status is pending     Patty Almedia Balls, CPhT Oncology Pharmacy Patient Advocate [facility] Direct Number: [phone] Fax: [phone]

## 2023-05-02 NOTE — Progress Notes (Signed)
 I met with patient in infusion while he is receiving blood transfusion. Reviewed education for venetoclax, allopurinol, compazine, and zofran. Paper prescriptions included in his packet. Filled out facility paperwork. I called [facility] and updated nursing that he was bringing prescriptions with him and to be on look out fo rthem. Provided my number to call if concerns.

## 2023-05-02 NOTE — Patient Instructions (Signed)
 Blood Transfusion, Adult A blood transfusion is a procedure in which you receive blood through an IV tube. You may need this procedure because of: A bleeding disorder. An illness. An injury. A surgery. The blood may come from someone else (a donor). You may also be able to donate blood for yourself before a surgery. The blood given in a transfusion may be made up of different types of cells. You may get: Red blood cells. These carry oxygen to the cells in the body. Platelets. These help your blood to clot. Plasma. This is the liquid part of your blood. It carries proteins and other substances through the body. White blood cells. These help you fight infections. If you have a clotting disorder, you may also get other types of blood products. Depending on the type of blood product, this procedure may take 1-4 hours to complete. Tell your doctor about: Any bleeding problems you have. Any reactions you have had during a blood transfusion in the past. Any allergies you have. All medicines you are taking, including vitamins, herbs, eye drops, creams, and over-the-counter medicines. Any surgeries you have had. Any medical conditions you have. Whether you are pregnant or may be pregnant. What are the risks? Talk with your health care provider about risks. The most common problems include: A mild allergic reaction. This includes red, swollen areas of skin (hives) and itching. Fever or chills. This may be the body's response to new blood cells received. This may happen during or up to 4 hours after the transfusion. More serious problems may include: A serious allergic reaction. This includes breathing trouble or swelling around the face and lips. Too much fluid in the lungs. This may cause breathing problems. Lung injury. This causes breathing trouble and low oxygen in the blood. This can happen within hours of the transfusion or days later. Too much iron. This can happen after getting many blood  transfusions over a period of time. An infection or virus passed through the blood. This is rare. Donated blood is carefully tested before it is given. Your body's defense system (immune system) trying to attack the new blood cells. This is rare. Symptoms may include fever, chills, nausea, low blood pressure, and low back or chest pain. Donated cells attacking healthy tissues. This is rare. What happens before the procedure? You will have a blood test to find out your blood type. The test also finds out what type of blood your body will accept and matches it to the donor type. If you are going to have a planned surgery, you may be able to donate your own blood. This may be done in case you need a transfusion. You will have your temperature, blood pressure, and pulse checked. You may receive medicine to help prevent an allergic reaction. This may be done if you have had a reaction to a transfusion before. This medicine may be given to you by mouth or through an IV tube. What happens during the procedure?  An IV tube will be put into one of your veins. The bag of blood will be attached to your IV tube. Then, the blood will enter through your vein. Your temperature, blood pressure, and pulse will be checked often. This is done to find early signs of a transfusion reaction. Tell your nurse right away if you have any of these symptoms: Shortness of breath or trouble breathing. Chest or back pain. Fever or chills. Red, swollen areas of skin or itching. If you have any signs  or symptoms of a reaction, your transfusion will be stopped. You may also be given medicine. When the transfusion is finished, your IV tube will be taken out. Pressure may be put on the IV site for a few minutes. A bandage (dressing) will be put on the IV site. The procedure may vary among doctors and hospitals. What happens after the procedure? You will be monitored until you leave the hospital or clinic. This includes  checking your temperature, blood pressure, pulse, breathing rate, and blood oxygen level. Your blood may be tested to see how you have responded to the transfusion. You may be warmed with fluids or blankets. This is done to keep the temperature of your body normal. If you have your procedure in an outpatient setting, you will be told whom to contact to report any reactions. Where to find more information Visit the [employer]: redcross.org Summary A blood transfusion is a procedure in which you receive blood through an IV tube. The blood you are given may be made up of different blood cells. You may receive red blood cells, platelets, plasma, or white blood cells. Your temperature, blood pressure, and pulse will be checked often. After the procedure, your blood may be tested to see how you have responded. This information is not intended to replace advice given to you by your health care provider. Make sure you discuss any questions you have with your health care provider. Document Revised: 05/06/2021 Document Reviewed: 05/06/2021 Elsevier Patient Education  2024 [employer].

## 2023-05-02 NOTE — Telephone Encounter (Addendum)
 Oral Oncology Patient Advocate Encounter  Prior Authorization for Robert [name] has been approved.    PA# [id number] Effective dates: 05/02/2023 through 05/01/2024  Patient's copay is: $9,811.91  Patty Almedia Balls, CPhT Oncology Pharmacy Patient Advocate [facility] Direct Number: [phone] Fax: [phone]

## 2023-05-03 ENCOUNTER — Other Ambulatory Visit: Payer: Self-pay | Admitting: Pharmacy Technician

## 2023-05-03 ENCOUNTER — Other Ambulatory Visit: Payer: Self-pay

## 2023-05-03 ENCOUNTER — Other Ambulatory Visit (HOSPITAL_COMMUNITY): Payer: Self-pay

## 2023-05-03 ENCOUNTER — Encounter: Payer: Self-pay | Admitting: Oncology

## 2023-05-03 LAB — TYPE AND SCREEN
ABO/RH(D): AB POS
Antibody Screen: NEGATIVE
Unit division: 0

## 2023-05-03 LAB — BPAM RBC
Blood Product Expiration Date: 202504082359
ISSUE DATE / TIME: 202503121342
Unit Type and Rh: 202504082359
Unit Type and Rh: 6200

## 2023-05-03 NOTE — Progress Notes (Signed)
 Patient education documented in EPIC note on 05/02/23.

## 2023-05-03 NOTE — Progress Notes (Signed)
 Specialty Pharmacy Initial Fill Coordination Note  Robert [name] is a 78 y.o. male contacted today regarding refills of specialty medication(s) Venetoclax (VENCLEXTA) .  Patient requested Delivery (Deliver to front office - ATTN Cindee Lame)  on 05/07/23 (Deliver to front office - ATTN Cindee Lame)  to verified address [address], [place], [place] [address] (Deliver to front office - ATTN Cindee Lame)   Medication will be filled on 05/04/2023.   Patient is aware of $0 copayment. Radio producer.  Call RN Cindee Lame at [facility] [phone] (Deliver to front office - ATTN Cindee Lame)  Patty Almedia Balls, CPhT Oncology Pharmacy Patient Advocate [facility] Direct Number: [phone] Fax: [phone]

## 2023-05-04 ENCOUNTER — Other Ambulatory Visit: Payer: Self-pay

## 2023-05-04 NOTE — Progress Notes (Signed)
 Pharmacist Chemotherapy Monitoring - Initial Assessment    Anticipated start date: 05/09/23   The following has been reviewed per standard work regarding the patient's treatment regimen: The patient's diagnosis, treatment plan and drug doses, and organ/hematologic function Lab orders and baseline tests specific to treatment regimen  The treatment plan start date, drug sequencing, and pre-medications Prior authorization status  Patient's documented medication list, including drug-drug interaction screen and prescriptions for anti-emetics and supportive care specific to the treatment regimen The drug concentrations, fluid compatibility, administration routes, and timing of the medications to be used The patient's access for treatment and lifetime cumulative dose history, if applicable  The patient's medication allergies and previous infusion related reactions, if applicable   Changes made to treatment plan:  Weekly metronomic SQ dosing of decitabine 0.2 mg/kg  Follow up needed:  N/A   Sharen Hones, PharmD, BCPS Clinical Pharmacist   05/04/2023  3:17 PM

## 2023-05-08 ENCOUNTER — Other Ambulatory Visit: Payer: Self-pay

## 2023-05-09 ENCOUNTER — Encounter: Payer: Self-pay | Admitting: Oncology

## 2023-05-09 ENCOUNTER — Inpatient Hospital Stay

## 2023-05-09 ENCOUNTER — Inpatient Hospital Stay (HOSPITAL_BASED_OUTPATIENT_CLINIC_OR_DEPARTMENT_OTHER): Admitting: Oncology

## 2023-05-09 ENCOUNTER — Telehealth: Payer: Self-pay | Admitting: *Deleted

## 2023-05-09 VITALS — BP 105/81 | HR 146 | Temp 96.2°F | Resp 18 | Ht 74.0 in | Wt 218.0 lb

## 2023-05-09 VITALS — HR 93

## 2023-05-09 DIAGNOSIS — C9200 Acute myeloblastic leukemia, not having achieved remission: Secondary | ICD-10-CM

## 2023-05-09 DIAGNOSIS — Z5111 Encounter for antineoplastic chemotherapy: Secondary | ICD-10-CM

## 2023-05-09 DIAGNOSIS — Z79899 Other long term (current) drug therapy: Secondary | ICD-10-CM | POA: Diagnosis not present

## 2023-05-09 DIAGNOSIS — E860 Dehydration: Secondary | ICD-10-CM

## 2023-05-09 LAB — CBC WITH DIFFERENTIAL (CANCER CENTER ONLY)
Abs Immature Granulocytes: 0.9 10*3/uL — ABNORMAL HIGH (ref 0.00–0.07)
Basophils Absolute: 0 10*3/uL (ref 0.0–0.1)
Basophils Relative: 0 %
Blasts: 4 %
Eosinophils Absolute: 0 10*3/uL (ref 0.0–0.5)
Eosinophils Relative: 0 %
HCT: 28.1 % — ABNORMAL LOW (ref 39.0–52.0)
Hemoglobin: 8.1 g/dL — ABNORMAL LOW (ref 13.0–17.0)
Lymphocytes Relative: 33 %
Lymphs Abs: 4.3 10*3/uL — ABNORMAL HIGH (ref 0.7–4.0)
MCH: 27.3 pg (ref 26.0–34.0)
MCHC: 28.8 g/dL — ABNORMAL LOW (ref 30.0–36.0)
MCV: 94.6 fL (ref 80.0–100.0)
Metamyelocytes Relative: 2 %
Monocytes Absolute: 0.9 10*3/uL (ref 0.1–1.0)
Monocytes Relative: 7 %
Myelocytes: 5 %
Neutro Abs: 6.3 10*3/uL (ref 1.7–7.7)
Neutrophils Relative %: 49 %
Platelet Count: 51 10*3/uL — ABNORMAL LOW (ref 150–400)
RBC: 2.97 MIL/uL — ABNORMAL LOW (ref 4.22–5.81)
RDW: 19.1 % — ABNORMAL HIGH (ref 11.5–15.5)
Smear Review: NORMAL
WBC Count: 12.9 10*3/uL — ABNORMAL HIGH (ref 4.0–10.5)
nRBC: 1 % — ABNORMAL HIGH (ref 0.0–0.2)

## 2023-05-09 LAB — CMP (CANCER CENTER ONLY)
ALT: 10 U/L (ref 0–44)
AST: 21 U/L (ref 15–41)
Albumin: 1.8 g/dL — ABNORMAL LOW (ref 3.5–5.0)
Alkaline Phosphatase: 93 U/L (ref 38–126)
Anion gap: 10 (ref 5–15)
BUN: 39 mg/dL — ABNORMAL HIGH (ref 8–23)
CO2: 24 mmol/L (ref 22–32)
Calcium: 7.8 mg/dL — ABNORMAL LOW (ref 8.9–10.3)
Chloride: 105 mmol/L (ref 98–111)
Creatinine: 2.25 mg/dL — ABNORMAL HIGH (ref 0.61–1.24)
GFR, Estimated: 29 mL/min — ABNORMAL LOW (ref >60)
Glucose, Bld: 156 mg/dL — ABNORMAL HIGH (ref 70–99)
Potassium: 3.8 mmol/L (ref 3.5–5.1)
Sodium: 139 mmol/L (ref 135–145)
Total Bilirubin: 0.5 mg/dL (ref 0.0–1.2)
Total Protein: 7.5 g/dL (ref 6.5–8.1)

## 2023-05-09 LAB — PATHOLOGIST SMEAR REVIEW

## 2023-05-09 MED ORDER — ACYCLOVIR 400 MG PO TABS: 400.0000 mg | ORAL_TABLET | Freq: Two times a day (BID) | ORAL | 1 refills | Status: DC

## 2023-05-09 MED ORDER — SODIUM CHLORIDE 0.9 % IV SOLN
INTRAVENOUS | Status: DC
  Filled 2023-05-09 (×2): qty 250

## 2023-05-09 MED ORDER — PROCHLORPERAZINE MALEATE 10 MG PO TABS
10.0000 mg | ORAL_TABLET | Freq: Once | ORAL | Status: AC
  Administered 2023-05-09: 10 mg via ORAL
  Filled 2023-05-09: qty 1

## 2023-05-09 MED ORDER — DECITABINE CHEMO SQ INJECTION 50 MG
20.0000 mg | Freq: Once | SUBCUTANEOUS | Status: AC
  Administered 2023-05-09: 20 mg via SUBCUTANEOUS
  Filled 2023-05-09: qty 2

## 2023-05-09 NOTE — Patient Instructions (Signed)
 [facility] - A DEPT OF [facility]  Discharge Instructions: Thank you for choosing [facility] to provide your oncology and hematology care.  If you have a lab appointment with the Cancer Center, please go directly to the Cancer Center and check in at the registration area.  Wear comfortable clothing and clothing appropriate for easy access to any Portacath or PICC line.   We strive to give you quality time with your provider. You may need to reschedule your appointment if you arrive late (15 or more minutes).  Arriving late affects you and other patients whose appointments are after yours.  Also, if you miss three or more appointments without notifying the office, you may be dismissed from the clinic at the provider's discretion.      For prescription refill requests, have your pharmacy contact our office and allow 72 hours for refills to be completed.    Today you received the following chemotherapy and/or immunotherapy agents- dacogen       To help prevent nausea and vomiting after your treatment, we encourage you to take your nausea medication as directed.  BELOW ARE SYMPTOMS THAT SHOULD BE REPORTED IMMEDIATELY: *FEVER GREATER THAN 100.4 F (38 C) OR HIGHER *CHILLS OR SWEATING *NAUSEA AND VOMITING THAT IS NOT CONTROLLED WITH YOUR NAUSEA MEDICATION *UNUSUAL SHORTNESS OF BREATH *UNUSUAL BRUISING OR BLEEDING *URINARY PROBLEMS (pain or burning when urinating, or frequent urination) *BOWEL PROBLEMS (unusual diarrhea, constipation, pain near the anus) TENDERNESS IN MOUTH AND THROAT WITH OR WITHOUT PRESENCE OF ULCERS (sore throat, sores in mouth, or a toothache) UNUSUAL RASH, SWELLING OR PAIN  UNUSUAL VAGINAL DISCHARGE OR ITCHING   Items with * indicate a potential emergency and should be followed up as soon as possible or go to the Emergency Department if any problems should occur.  Please show the CHEMOTHERAPY ALERT CARD or IMMUNOTHERAPY  ALERT CARD at check-in to the Emergency Department and triage nurse.  Should you have questions after your visit or need to cancel or reschedule your appointment, please contact [facility] - A DEPT OF [facility]  [phone] and follow the prompts.  Office hours are 8:00 a.m. to 4:30 p.m. Monday - Friday. Please note that voicemails left after 4:00 p.m. may not be returned until the following business day.  We are closed weekends and major holidays. You have access to a nurse at all times for urgent questions. Please call the main number to the clinic [phone] and follow the prompts.  For any non-urgent questions, you may also contact your provider using MyChart. We now offer e-Visits for anyone 55 and older to request care online for non-urgent symptoms. For details visit [email or web address].   Also download the MyChart app! Go to the app store, search "MyChart", open the app, select , and log in with your MyChart username and password.  Decitabine Injection What is this medication? DECITABINE (dee SYE ta been) treats blood and bone marrow cancers. It works by slowing down the growth of cancer cells. This medicine may be used for other purposes; ask your health care provider or pharmacist if you have questions. COMMON BRAND NAME(S): Dacogen What should I tell my care team before I take this medication? They need to know if you have any of these conditions: Infection Kidney disease Liver disease An unusual or allergic reaction to decitabine, other medications, foods, dyes, or preservatives Pregnant or trying to get pregnant  Breast-feeding How should I use this medication? This medication is infused into a vein. It is given by your care team in a hospital or clinic setting. Talk to your care team about the use of this medication in children. Special care may be needed. Overdosage: If you think you have taken too much of this medicine  contact a poison control center or emergency room at once. NOTE: This medicine is only for you. Do not share this medicine with others. What if I miss a dose? Keep appointments for follow-up doses. It is important not to miss your dose. Call your care team if you are unable to keep an appointment. What may interact with this medication? Interactions are not expected. This list may not describe all possible interactions. Give your health care provider a list of all the medicines, herbs, non-prescription drugs, or dietary supplements you use. Also tell them if you smoke, drink alcohol, or use illegal drugs. Some items may interact with your medicine. What should I watch for while using this medication? Visit your care team for regular checks on your progress. You may need blood work while taking this medication. This medication may make you feel generally unwell. This is not uncommon as chemotherapy can affect healthy cells as well as cancer cells. Report any side effects. Continue your course of treatment even though you feel ill unless your care team tells you to stop. This medication may increase your risk of getting an infection. Call your care team for advice if you get a fever, chills, sore throat, or other symptoms of a cold or flu. Do not treat yourself. Try to avoid being around people who are sick. Be careful brushing or flossing your teeth or using a toothpick because you may get an infection or bleed more easily. If you have any dental work done, tell your dentist you are receiving this medication. Call your care team if you are around anyone with measles, chickenpox, or if you develop sores or blisters that do not heal properly. Avoid taking medications that contain aspirin, acetaminophen, ibuprofen, naproxen, or ketoprofen unless instructed by your care team. These medications may hide a fever. Talk to your care team if you or your partner wish to become pregnant or think either of you  might be pregnant. This medication can cause serious birth defects if taken during pregnancy or for 6 months after the last dose. A negative pregnancy test is required before starting this medication. A reliable form of contraception is recommended while taking this medication and for 6 months after the last dose. Talk to your care team about reliable forms of contraception. Do not father a child while taking this medication or for 3 months after the last dose. Use a condom while having sex during this time period. Do not breast-feed while taking this medication and for 2 weeks after the last dose. This medication may cause infertility. Talk to your care team if you are concerned about your fertility. What side effects may I notice from receiving this medication? Side effects that you should report to your care team as soon as possible: Allergic reactions--skin rash, itching, hives, swelling of the face, lips, tongue, or throat Infection--fever, chills, cough, sore throat, wounds that don't heal, pain or trouble when passing urine, general feeling of discomfort or being unwell Low red blood cell level--unusual weakness or fatigue, dizziness, headache, trouble breathing Unusual bruising or bleeding Side effects that usually do not require medical attention (report to your care team if  they continue or are bothersome): Constipation Diarrhea Fatigue Nausea Pain, redness, or swelling with sores inside the mouth or throat Stomach pain This list may not describe all possible side effects. Call your doctor for medical advice about side effects. You may report side effects to FDA at 1-800-FDA-1088. Where should I keep my medication? This medication is given in a hospital or clinic. It will not be stored at home. NOTE: This sheet is a summary. It may not cover all possible information. If you have questions about this medicine, talk to your doctor, pharmacist, or health care provider.  2024 Elsevier/Gold  Standard (2021-05-18 00:00:00)

## 2023-05-09 NOTE — Telephone Encounter (Signed)
 I called Cheroyl, RN at [facility] to confirm patient was scheduled to take his Venclexta today. Pt already had his first dose prior to leaving the facility this morning per RN. Also notified facility that dr. Smith Robert is adding acyclovir to patient's medication list. Pt will be be sent back to facility with orders on progress notes. Plan of care read back by facility rn.

## 2023-05-10 ENCOUNTER — Inpatient Hospital Stay

## 2023-05-12 ENCOUNTER — Encounter: Payer: Self-pay | Admitting: Oncology

## 2023-05-12 NOTE — Progress Notes (Signed)
 Hematology/Oncology Consult note [facility]  Telephone:([phone] [phone]  Patient Care Team: Corky Downs, MD as PCP - General (Internal Medicine) Creig Hines, MD as Consulting Physician (Oncology)   Name of the patient: Robert [name]  [id number]  1945-12-26   Date of visit: 05/12/23  Diagnosis- AML  Chief complaint/ Reason for visit- on treatment assessment prior to cycle 1 of weekly decitabine and venetoclax  Heme/Onc history: Patient is a 78 year old male with past medical history significant for hypertension hyperlipidemia type 2 diabetes peripheral arterial disease who is currently admitted to the hospital for symptoms of recurrent UTI and ESBL sepsis.  He was seen by me in January 2025 for posthospital discharge follow-up for pancytopenia.His hemoglobin from last year was 13 and had dropped down to 8.5 in June 2024. At that time his ferritin levels were normal at 121 but iron saturation low at 17% with a normal TIBC. Subsequently his iron saturation dropped to 13%. B12 levels and folate levels have been normal. We do not have any hemoglobin levels between last year and this year.  His anemia at that time was attributed to chronic disease but as planning to get a bone marrow biopsy down the line if the results of peripheral blood work did not reveal any discerning etiology.B12 level, TSH and haptoglobin at that time were normal.  Myeloma panel showed polyclonal increase in immunoglobulins but no monoclonal protein.  Serum free light chains showed both kappa and lambda light chain elevation but total ratio was normal.  Folate mildly low at 5.7.  Ferritin levels elevated at 1439   He underwent a colonoscopy by Dr. Timothy Lasso on 08/21/2022 due to concerns of anemia and he was found to have a 5 cm sigmoid mass which was biopsied and was consistent with intramucosal adenocarcinoma with focal area suspicious for at least superficial invasion into the  muscularis mucosa. There was another flat lesion noted in the ascending colon which was not biopsied but was concerning in appearance. 3 other polyps in the ascending colon positive for tubular adenoma. Patient underwent Sigmoid colon partial colectomy on 01/01/2023 at [facility].  Final pathology showed invasive adenocarcinoma moderately differentiated with negative margins.  15 lymph nodes negative for malignancy.  pT1 N0   Given his persistent pancytopenia patient underwent bone marrow Biopsy which was consistent with acute myeloid leukemia with total blasts ranging between 20 to 30%.  Overall bone marrow was hypercellular 75% with significantly left shifted both myeloid and erythroid lineages.Morphologically difficult to a certain definitive blasts from immature precursors.  Greater than 20% were identified however on aspirate smear slides, 17% by flow cytometry and overall 30% by CD34 IHC.  Significantly increased CD1 1 7 positive immature precursors.  Findings consistent with AML.  This could be AML arising from MDS. Normal cytogenetics.  [employer] showed ASX cell 1, IDH 2, SRS F2, STA G2.  No mutation seen CALR, flit 3, IDH 1, JAK2, MPL, NPM1 or T p53  Interval history-he is working on his strength at the nursing home but he has still been unable to walk.  No recent infections.  ECOG PS- 3 Pain scale- 0   Review of systems- Review of Systems  Constitutional:  Positive for malaise/fatigue. Negative for chills, fever and weight loss.  HENT:  Negative for congestion, ear discharge and nosebleeds.   Eyes:  Negative for blurred vision.  Respiratory:  Negative for cough, hemoptysis, sputum production, shortness of breath and wheezing.   Cardiovascular:  Negative  for chest pain, palpitations, orthopnea and claudication.  Gastrointestinal:  Negative for abdominal pain, blood in stool, constipation, diarrhea, heartburn, melena, nausea and vomiting.  Genitourinary:  Negative for dysuria, flank pain,  frequency, hematuria and urgency.  Musculoskeletal:  Negative for back pain, joint pain and myalgias.  Skin:  Negative for rash.  Neurological:  Negative for dizziness, tingling, focal weakness, seizures, weakness and headaches.  Endo/Heme/Allergies:  Does not bruise/bleed easily.  Psychiatric/Behavioral:  Negative for depression and suicidal ideas. The patient does not have insomnia.       No Known Allergies   Past Medical History:  Diagnosis Date   Allergy    Cellulitis    Diabetes mellitus without complication (HCC)    Hyperlipidemia    Hypertension    Peripheral vascular disease [facility])      Past Surgical History:  Procedure Laterality Date   BIOPSY  08/21/2022   Procedure: BIOPSY;  Surgeon: [name], DO;  [facility] ENDOSCOPY;  Service: Gastroenterology;;   COLONOSCOPY WITH PROPOFOL N/A 10/02/2017   Procedure: COLONOSCOPY WITH PROPOFOL;  Surgeon: Wyline Mood, MD;  Location: [facility] ENDOSCOPY;  Service: Gastroenterology;  Laterality: N/A;   COLONOSCOPY WITH PROPOFOL N/A 08/21/2022   Procedure: COLONOSCOPY WITH PROPOFOL;  Surgeon: [name], DO;  Location: [facility] ENDOSCOPY;  Service: Gastroenterology;  Laterality: N/A;   ESOPHAGOGASTRODUODENOSCOPY (EGD) WITH PROPOFOL N/A 08/11/2022   Procedure: ESOPHAGOGASTRODUODENOSCOPY (EGD) WITH PROPOFOL;  Surgeon: Midge Minium, MD;  Location: [facility] ENDOSCOPY;  Service: Endoscopy;  Laterality: N/A;   HERNIA REPAIR     IR BONE MARROW BIOPSY & ASPIRATION  04/10/2023   PERICARDIOCENTESIS N/A 08/29/2022   Procedure: PERICARDIOCENTESIS;  Surgeon: Yvonne Kendall, MD;  Location: [facility] INVASIVE CV LAB;  Service: Cardiovascular;  Laterality: N/A;   POLYPECTOMY  08/21/2022   Procedure: POLYPECTOMY;  Surgeon: [name], DO;  Location: [facility] ENDOSCOPY;  Service: Gastroenterology;;    Social History   Socioeconomic History   Marital status: Married    Spouse name: Not on file   Number of children: 0   Years of education:  College Gradute   Highest education level: Bachelor's degree (e.g., BA, AB, BS)  Occupational History   Not on file  Tobacco Use   Smoking status: Never   Smokeless tobacco: Never  Vaping Use   Vaping status: Never Used  Substance and Sexual Activity   Alcohol use: Not Currently    Comment: Occasionally   Drug use: No   Sexual activity: Not Currently  Other Topics Concern   Not on file  Social History Narrative   Not on file   Social Drivers of Health   Financial Resource Strain: Low Risk  (12/15/2022)   Received from [facility]   Overall Financial Resource Strain (CARDIA)    Difficulty of Paying Living Expenses: Not hard at all  Food Insecurity: Patient Unable To Answer (04/04/2023)   Hunger Vital Sign    Worried About Running Out of Food in the Last Year: Patient unable to answer    Ran Out of Food in the Last Year: Patient unable to answer  Transportation Needs: Patient Unable To Answer (04/04/2023)   PRAPARE - Transportation    Lack of Transportation (Medical): Patient unable to answer    Lack of Transportation (Non-Medical): Patient unable to answer  Physical Activity: Inactive (10/29/2020)   Exercise Vital Sign    Days of Exercise per Week: 0 days    Minutes of Exercise per Session: 0 min  Stress: No Stress Concern  Present (10/29/2020)   [name] of Occupational Health - Occupational Stress Questionnaire    Feeling of Stress : Not at all  Social Connections: Patient Unable To Answer (04/04/2023)   Social Connection and Isolation Panel [NHANES]    Frequency of Communication with Friends and Family: Patient unable to answer    Frequency of Social Gatherings with Friends and Family: Patient unable to answer    Attends Religious Services: Patient unable to answer    Active Member of Clubs or Organizations: Patient unable to answer    Attends Banker Meetings: Patient unable to answer    Marital Status: Patient unable to answer   Intimate Partner Violence: Not At Risk (04/04/2023)   Humiliation, Afraid, Rape, and Kick questionnaire    Fear of Current or Ex-Partner: No    Emotionally Abused: No    Physically Abused: No    Sexually Abused: No    Family History  Problem Relation Age of Onset   Heart disease Mother    Stroke Mother    Heart disease Father    Heart attack Father      Current Outpatient Medications:    acyclovir (ZOVIRAX) 400 MG tablet, Take 1 tablet (400 mg total) by mouth 2 (two) times daily., Disp: 60 tablet, Rfl: 1   allopurinol (ZYLOPRIM) 300 MG tablet, Take 1 tablet (300 mg total) by mouth daily., Disp: 30 tablet, Rfl: 2   BD PEN NEEDLE NANO 2ND GEN 32G X 4 MM MISC, USE 1 PEN NEEDLE EVERY DAY AS DIRECTED, Disp: 100 each, Rfl: 4   bisacodyl (DULCOLAX) 10 MG suppository, Place 1 suppository (10 mg total) rectally daily as needed for moderate constipation., Disp: 8 suppository, Rfl: 0   DULoxetine (CYMBALTA) 60 MG capsule, Take 1 capsule (60 mg total) by mouth 2 (two) times daily., Disp: 14 capsule, Rfl: 0   ferrous sulfate 325 (65 FE) MG tablet, Take 1 tablet (325 mg total) by mouth 2 (two) times daily with a meal for 14 days., Disp: 28 tablet, Rfl: 0   furosemide (LASIX) 20 MG tablet, Take 1 tablet (20 mg total) by mouth daily. Increase to 1 tablet (20 mg total) by mouth TWICE daily (total daily dose 40 mg) as needed for up to 3 days for increased leg swelling, shortness of breath, weight gain 5+ lbs over 1-2 days. Seek medical care if these symptoms are not improving with increased dose., Disp: 14 tablet, Rfl: 0   hydrOXYzine (ATARAX) 25 MG tablet, Take 1 tablet (25 mg total) by mouth 3 (three) times daily as needed for itching., Disp: 10 tablet, Rfl: 0   latanoprost (XALATAN) 0.005 % ophthalmic solution, Place 1 drop into both eyes at bedtime., Disp: 2.5 mL, Rfl: 0   melatonin 5 MG TABS, Take 1 tablet (5 mg total) by mouth at bedtime., Disp: 7 tablet, Rfl: 0   metoprolol succinate (TOPROL-XL) 50  MG 24 hr tablet, Take 1 tablet (50 mg total) by mouth daily. Take with or immediately following a meal., Disp: 7 tablet, Rfl: 0   mirtazapine (REMERON) 15 MG tablet, Take 1 tablet (15 mg total) by mouth daily., Disp: 7 tablet, Rfl: 0   ondansetron (ZOFRAN) 4 MG tablet, Take 1 tablet (4 mg total) by mouth every 6 (six) hours as needed for nausea., Disp: 20 tablet, Rfl: 0   ondansetron (ZOFRAN) 8 MG tablet, Take 1 tablet (8 mg total) by mouth every 8 (eight) hours as needed for nausea or vomiting., Disp: 30 tablet, Rfl:  1   oxybutynin (DITROPAN-XL) 10 MG 24 hr tablet, Take 1 tablet (10 mg total) by mouth every evening., Disp: 7 tablet, Rfl: 0   pantoprazole (PROTONIX) 40 MG tablet, Take 1 tablet (40 mg total) by mouth 2 (two) times daily before a meal., Disp: 14 tablet, Rfl: 0   polyethylene glycol powder (GLYCOLAX/MIRALAX) 17 GM/SCOOP powder, Take 17 g by mouth daily., Disp: 238 g, Rfl: 0   prochlorperazine (COMPAZINE) 10 MG tablet, Take 1 tablet (10 mg total) by mouth every 6 (six) hours as needed for nausea or vomiting., Disp: 30 tablet, Rfl: 1   senna-docusate (FT STOOL SOFTENER) 8.6-50 MG tablet, Take 2 tablets by mouth daily as needed for mild constipation., Disp: 14 tablet, Rfl: 0   simvastatin (ZOCOR) 20 MG tablet, Take 1 tablet (20 mg total) by mouth daily., Disp: 7 tablet, Rfl: 0   sodium phosphate (FLEET) ENEM, Place 133 mLs (1 enema total) rectally daily as needed for severe constipation., Disp: 133 mL, Rfl: 0   venetoclax (VENCLEXTA) 100 MG tablet, Take 4 tablets (400 mg total) by mouth once a week. Tablets should be swallowed whole with a meal and a full glass of water., Disp: 16 tablet, Rfl: 0  Physical exam:  Vitals:   05/09/23 1115  BP: 105/81  Pulse: (!) 146  Resp: 18  Temp: (!) 96.2 F (35.7 C)  TempSrc: Tympanic  SpO2: 99%  Weight: 218 lb (98.9 kg)  Height: 6\' 2"  (1.88 m)   Physical Exam Constitutional:      Comments: Sitting in a wheelchair. Appears in no acute distress   Cardiovascular:     Rate and Rhythm: Normal rate and regular rhythm.     Heart sounds: Normal heart sounds.  Pulmonary:     Effort: Pulmonary effort is normal.     Breath sounds: Normal breath sounds.  Abdominal:     General: Bowel sounds are normal.     Palpations: Abdomen is soft.  Musculoskeletal:     Right lower leg: Edema present.     Left lower leg: Edema present.  Skin:    General: Skin is warm and dry.  Neurological:     Mental Status: He is alert and oriented to person, place, and time.         Latest Ref Rng & Units 05/09/2023   10:48 AM  CMP  Glucose 70 - 99 mg/dL 308   BUN 8 - 23 mg/dL 39   Creatinine 6.57 - 1.24 mg/dL 8.46   Sodium 962 - 952 mmol/L 139   Potassium 3.5 - 5.1 mmol/L 3.8   Chloride 98 - 111 mmol/L 105   CO2 22 - 32 mmol/L 24   Calcium 8.9 - 10.3 mg/dL 7.8   Total Protein 6.5 - 8.1 g/dL 7.5   Total Bilirubin 0.0 - 1.2 mg/dL 0.5   Alkaline Phos 38 - 126 U/L 93   AST 15 - 41 U/L 21   ALT 0 - 44 U/L 10       Latest Ref Rng & Units 05/09/2023   10:48 AM  CBC  WBC 4.0 - 10.5 K/uL 12.9   Hemoglobin 13.0 - 17.0 g/dL 8.1   Hematocrit 84.1 - 52.0 % 28.1   Platelets 150 - 400 K/uL 51      Assessment and plan- Patient is a 78 y.o. male with history of AML NPML1, FLT3, TP53 -, IDH2 positive here for on treatment assessment prior to cycle 1 of weekly decitabine and venetoclax  Patient  is undergoing metronomic dosing with weekly venetoclax 400 mg along with Decitabine 0.2 mg subcutaneously.  For first 4 cycles we will give venetoclax on the day of Decitabin but subsequently we will plan to give it a day prior as per original treatment protocol.  Patient is already received his venetoclax at the nursing facility today and will receive decitabine here today.  He does not require any transfusions at this time.  I will see him back in 1 week's time for dose 2 and subsequently I will see him every other week.  I will start him on acyclovir prophylaxis at  this time.If he has significant cytopenias with treatment and ANC drifts down to less than 0.5 I will consider adding antibacterial prophylaxis with Levaquin and consideration for antifungal prophylaxis as well.  AKI: Patient'sCreatinine fluctuates between 1.5-2.3.  Continue to monitor.  Overall given the weekly metronomic regimen his risk for tumor lysis syndrome is low   Visit Diagnosis 1. Encounter for antineoplastic chemotherapy   2. Acute myeloid leukemia not having achieved remission (HCC)   3. High risk medication use      Dr. Owens Shark, MD, MPH [facility] at [facility] [phone] 05/12/2023 2:40 PM

## 2023-05-16 ENCOUNTER — Inpatient Hospital Stay (HOSPITAL_BASED_OUTPATIENT_CLINIC_OR_DEPARTMENT_OTHER): Admitting: Oncology

## 2023-05-16 ENCOUNTER — Encounter: Payer: Self-pay | Admitting: Intensive Care

## 2023-05-16 ENCOUNTER — Other Ambulatory Visit: Payer: Self-pay | Admitting: *Deleted

## 2023-05-16 ENCOUNTER — Inpatient Hospital Stay
Admission: EM | Admit: 2023-05-16 | Discharge: 2023-05-25 | DRG: 682 | Disposition: A | Discharge status: Other Acute Inpatient Hospital | Source: Ambulatory Visit | Attending: Internal Medicine | Admitting: Internal Medicine

## 2023-05-16 ENCOUNTER — Telehealth: Payer: Self-pay

## 2023-05-16 ENCOUNTER — Telehealth: Payer: Self-pay | Admitting: *Deleted

## 2023-05-16 ENCOUNTER — Inpatient Hospital Stay

## 2023-05-16 ENCOUNTER — Encounter: Payer: Self-pay | Admitting: Oncology

## 2023-05-16 ENCOUNTER — Other Ambulatory Visit: Payer: Self-pay

## 2023-05-16 ENCOUNTER — Observation Stay

## 2023-05-16 VITALS — BP 135/90 | HR 91 | Temp 95.7°F | Resp 19 | Ht 74.0 in | Wt 220.2 lb

## 2023-05-16 DIAGNOSIS — Z79899 Other long term (current) drug therapy: Secondary | ICD-10-CM

## 2023-05-16 DIAGNOSIS — N179 Acute kidney failure, unspecified: Secondary | ICD-10-CM | POA: Diagnosis present

## 2023-05-16 DIAGNOSIS — Z823 Family history of stroke: Secondary | ICD-10-CM

## 2023-05-16 DIAGNOSIS — E118 Type 2 diabetes mellitus with unspecified complications: Secondary | ICD-10-CM | POA: Diagnosis present

## 2023-05-16 DIAGNOSIS — I10 Essential (primary) hypertension: Secondary | ICD-10-CM | POA: Diagnosis present

## 2023-05-16 DIAGNOSIS — I5022 Chronic systolic (congestive) heart failure: Secondary | ICD-10-CM | POA: Diagnosis not present

## 2023-05-16 DIAGNOSIS — N1832 Chronic kidney disease, stage 3b: Secondary | ICD-10-CM | POA: Diagnosis present

## 2023-05-16 DIAGNOSIS — D696 Thrombocytopenia, unspecified: Secondary | ICD-10-CM | POA: Diagnosis present

## 2023-05-16 DIAGNOSIS — E1151 Type 2 diabetes mellitus with diabetic peripheral angiopathy without gangrene: Secondary | ICD-10-CM | POA: Diagnosis present

## 2023-05-16 DIAGNOSIS — D6959 Other secondary thrombocytopenia: Secondary | ICD-10-CM | POA: Diagnosis present

## 2023-05-16 DIAGNOSIS — I5023 Acute on chronic systolic (congestive) heart failure: Secondary | ICD-10-CM | POA: Diagnosis present

## 2023-05-16 DIAGNOSIS — N170 Acute kidney failure with tubular necrosis: Secondary | ICD-10-CM | POA: Diagnosis not present

## 2023-05-16 DIAGNOSIS — E1122 Type 2 diabetes mellitus with diabetic chronic kidney disease: Secondary | ICD-10-CM | POA: Diagnosis present

## 2023-05-16 DIAGNOSIS — Z6828 Body mass index (BMI) 28.0-28.9, adult: Secondary | ICD-10-CM

## 2023-05-16 DIAGNOSIS — C9200 Acute myeloblastic leukemia, not having achieved remission: Secondary | ICD-10-CM

## 2023-05-16 DIAGNOSIS — E663 Overweight: Secondary | ICD-10-CM | POA: Diagnosis present

## 2023-05-16 DIAGNOSIS — D611 Drug-induced aplastic anemia: Secondary | ICD-10-CM | POA: Diagnosis present

## 2023-05-16 DIAGNOSIS — D649 Anemia, unspecified: Secondary | ICD-10-CM

## 2023-05-16 DIAGNOSIS — D509 Iron deficiency anemia, unspecified: Secondary | ICD-10-CM | POA: Diagnosis present

## 2023-05-16 DIAGNOSIS — Z8249 Family history of ischemic heart disease and other diseases of the circulatory system: Secondary | ICD-10-CM

## 2023-05-16 DIAGNOSIS — I130 Hypertensive heart and chronic kidney disease with heart failure and stage 1 through stage 4 chronic kidney disease, or unspecified chronic kidney disease: Secondary | ICD-10-CM | POA: Diagnosis present

## 2023-05-16 DIAGNOSIS — N261 Atrophy of kidney (terminal): Secondary | ICD-10-CM | POA: Diagnosis present

## 2023-05-16 DIAGNOSIS — D631 Anemia in chronic kidney disease: Secondary | ICD-10-CM | POA: Diagnosis present

## 2023-05-16 DIAGNOSIS — E872 Acidosis, unspecified: Secondary | ICD-10-CM | POA: Diagnosis present

## 2023-05-16 DIAGNOSIS — R5381 Other malaise: Secondary | ICD-10-CM | POA: Diagnosis present

## 2023-05-16 DIAGNOSIS — Z9049 Acquired absence of other specified parts of digestive tract: Secondary | ICD-10-CM

## 2023-05-16 DIAGNOSIS — T451X5A Adverse effect of antineoplastic and immunosuppressive drugs, initial encounter: Secondary | ICD-10-CM | POA: Diagnosis present

## 2023-05-16 DIAGNOSIS — E785 Hyperlipidemia, unspecified: Secondary | ICD-10-CM | POA: Diagnosis present

## 2023-05-16 DIAGNOSIS — Z5111 Encounter for antineoplastic chemotherapy: Secondary | ICD-10-CM

## 2023-05-16 DIAGNOSIS — E871 Hypo-osmolality and hyponatremia: Secondary | ICD-10-CM | POA: Diagnosis not present

## 2023-05-16 DIAGNOSIS — Z85038 Personal history of other malignant neoplasm of large intestine: Secondary | ICD-10-CM

## 2023-05-16 HISTORY — DX: Leukemia, unspecified not having achieved remission: C95.90

## 2023-05-16 LAB — CBC WITH DIFFERENTIAL (CANCER CENTER ONLY)
Abs Immature Granulocytes: 0.21 10*3/uL — ABNORMAL HIGH (ref 0.00–0.07)
Basophils Absolute: 0 10*3/uL (ref 0.0–0.1)
Basophils Relative: 0 %
Eosinophils Absolute: 0 10*3/uL (ref 0.0–0.5)
Eosinophils Relative: 0 %
HCT: 23.4 % — ABNORMAL LOW (ref 39.0–52.0)
Hemoglobin: 7 g/dL — ABNORMAL LOW (ref 13.0–17.0)
Immature Granulocytes: 2 %
Lymphocytes Relative: 26 %
Lymphs Abs: 3.5 10*3/uL (ref 0.7–4.0)
MCH: 27.9 pg (ref 26.0–34.0)
MCHC: 29.9 g/dL — ABNORMAL LOW (ref 30.0–36.0)
MCV: 93.2 fL (ref 80.0–100.0)
Monocytes Absolute: 1.6 10*3/uL — ABNORMAL HIGH (ref 0.1–1.0)
Monocytes Relative: 12 %
Neutro Abs: 8.2 10*3/uL — ABNORMAL HIGH (ref 1.7–7.7)
Neutrophils Relative %: 60 %
Platelet Count: 41 10*3/uL — ABNORMAL LOW (ref 150–400)
RBC: 2.51 MIL/uL — ABNORMAL LOW (ref 4.22–5.81)
RDW: 18 % — ABNORMAL HIGH (ref 11.5–15.5)
WBC Count: 13.5 10*3/uL — ABNORMAL HIGH (ref 4.0–10.5)
nRBC: 0.3 % — ABNORMAL HIGH (ref 0.0–0.2)

## 2023-05-16 LAB — CMP (CANCER CENTER ONLY)
ALT: 11 U/L (ref 0–44)
AST: 22 U/L (ref 15–41)
Albumin: 1.6 g/dL — ABNORMAL LOW (ref 3.5–5.0)
Alkaline Phosphatase: 105 U/L (ref 38–126)
Anion gap: 15 (ref 5–15)
BUN: 72 mg/dL — ABNORMAL HIGH (ref 8–23)
CO2: 20 mmol/L — ABNORMAL LOW (ref 22–32)
Calcium: 6.8 mg/dL — ABNORMAL LOW (ref 8.9–10.3)
Chloride: 104 mmol/L (ref 98–111)
Creatinine: 4.6 mg/dL — ABNORMAL HIGH (ref 0.61–1.24)
GFR, Estimated: 12 mL/min — ABNORMAL LOW (ref >60)
Glucose, Bld: 112 mg/dL — ABNORMAL HIGH (ref 70–99)
Potassium: 4.2 mmol/L (ref 3.5–5.1)
Sodium: 139 mmol/L (ref 135–145)
Total Bilirubin: 0.8 mg/dL (ref 0.0–1.2)
Total Protein: 7.4 g/dL (ref 6.5–8.1)

## 2023-05-16 LAB — CBC
HCT: 22.8 % — ABNORMAL LOW (ref 39.0–52.0)
Hemoglobin: 7 g/dL — ABNORMAL LOW (ref 13.0–17.0)
MCH: 27.5 pg (ref 26.0–34.0)
MCHC: 30.7 g/dL (ref 30.0–36.0)
MCV: 89.4 fL (ref 80.0–100.0)
Platelets: 41 10*3/uL — ABNORMAL LOW (ref 150–400)
RBC: 2.55 MIL/uL — ABNORMAL LOW (ref 4.22–5.81)
RDW: 18.3 % — ABNORMAL HIGH (ref 11.5–15.5)
WBC: 11.4 10*3/uL — ABNORMAL HIGH (ref 4.0–10.5)
nRBC: 0.3 % — ABNORMAL HIGH (ref 0.0–0.2)

## 2023-05-16 LAB — URINALYSIS, ROUTINE W REFLEX MICROSCOPIC
Bilirubin Urine: NEGATIVE
Glucose, UA: NEGATIVE mg/dL
Ketones, ur: NEGATIVE mg/dL
Nitrite: NEGATIVE
Protein, ur: 100 mg/dL — AB
Specific Gravity, Urine: 1.009 (ref 1.005–1.030)
Squamous Epithelial / HPF: 0 /HPF (ref 0–5)
WBC, UA: >50 WBC/hpf (ref 0–5)
pH: 6 (ref 5.0–8.0)

## 2023-05-16 LAB — MRSA NEXT GEN BY PCR, NASAL: MRSA by PCR Next Gen: NOT DETECTED

## 2023-05-16 LAB — PHOSPHORUS: Phosphorus: 5.3 mg/dL — ABNORMAL HIGH (ref 2.5–4.6)

## 2023-05-16 LAB — PREPARE RBC (CROSSMATCH)

## 2023-05-16 LAB — MAGNESIUM: Magnesium: 1 mg/dL — ABNORMAL LOW (ref 1.7–2.4)

## 2023-05-16 LAB — BPAM RBC
Blood Product Expiration Date: 202504182359
Unit Type and Rh: 5100

## 2023-05-16 LAB — CK: Total CK: 56 U/L (ref 49–397)

## 2023-05-16 MED ORDER — FERROUS SULFATE 325 (65 FE) MG PO TABS
325.0000 mg | ORAL_TABLET | Freq: Every day | ORAL | Status: DC
  Administered 2023-05-17 – 2023-05-25 (×9): 325 mg via ORAL
  Filled 2023-05-16 (×9): qty 1

## 2023-05-16 MED ORDER — MELATONIN 5 MG PO TABS
5.0000 mg | ORAL_TABLET | Freq: Every day | ORAL | Status: DC
  Administered 2023-05-16 – 2023-05-24 (×9): 5 mg via ORAL
  Filled 2023-05-16 (×9): qty 1

## 2023-05-16 MED ORDER — DECITABINE CHEMO SQ INJECTION 50 MG: 20.0000 mg | Freq: Once | SUBCUTANEOUS | Status: DC

## 2023-05-16 MED ORDER — SIMVASTATIN 20 MG PO TABS
20.0000 mg | ORAL_TABLET | Freq: Every day | ORAL | Status: DC
  Administered 2023-05-17 – 2023-05-25 (×9): 20 mg via ORAL
  Filled 2023-05-16 (×9): qty 1

## 2023-05-16 MED ORDER — LATANOPROST 0.005 % OP SOLN
1.0000 [drp] | Freq: Every day | OPHTHALMIC | Status: DC
Start: 2023-05-16 — End: 2023-05-25
  Administered 2023-05-17 – 2023-05-24 (×8): 1 [drp] via OPHTHALMIC
  Filled 2023-05-16 (×2): qty 2.5

## 2023-05-16 MED ORDER — SODIUM CHLORIDE 0.9% FLUSH
3.0000 mL | Freq: Two times a day (BID) | INTRAVENOUS | Status: DC
  Administered 2023-05-16 – 2023-05-25 (×18): 3 mL via INTRAVENOUS

## 2023-05-16 MED ORDER — ACETAMINOPHEN 650 MG RE SUPP: 650.0000 mg | Freq: Four times a day (QID) | RECTAL | Status: DC | PRN

## 2023-05-16 MED ORDER — MIRTAZAPINE 15 MG PO TABS
15.0000 mg | ORAL_TABLET | Freq: Every day | ORAL | Status: DC
  Administered 2023-05-16 – 2023-05-24 (×9): 15 mg via ORAL
  Filled 2023-05-16 (×9): qty 1

## 2023-05-16 MED ORDER — METOPROLOL SUCCINATE ER 50 MG PO TB24
50.0000 mg | ORAL_TABLET | Freq: Every day | ORAL | Status: DC
  Administered 2023-05-17 – 2023-05-25 (×9): 50 mg via ORAL
  Filled 2023-05-16 (×9): qty 1

## 2023-05-16 MED ORDER — PROCHLORPERAZINE MALEATE 10 MG PO TABS: 10.0000 mg | ORAL_TABLET | Freq: Once | ORAL | Status: DC

## 2023-05-16 MED ORDER — ONDANSETRON HCL 4 MG/2ML IJ SOLN: 4.0000 mg | Freq: Four times a day (QID) | INTRAMUSCULAR | Status: DC | PRN

## 2023-05-16 MED ORDER — DULOXETINE HCL 30 MG PO CPEP
60.0000 mg | ORAL_CAPSULE | Freq: Two times a day (BID) | ORAL | Status: DC
  Administered 2023-05-16 – 2023-05-25 (×18): 60 mg via ORAL
  Filled 2023-05-16 (×18): qty 2

## 2023-05-16 MED ORDER — ONDANSETRON HCL 4 MG PO TABS: 4.0000 mg | ORAL_TABLET | Freq: Four times a day (QID) | ORAL | Status: DC | PRN

## 2023-05-16 MED ORDER — LOPERAMIDE HCL 2 MG PO CAPS: 2.0000 mg | ORAL_CAPSULE | ORAL | Status: DC | PRN

## 2023-05-16 MED ORDER — MAGNESIUM SULFATE 4 GM/100ML IV SOLN
4.0000 g | Freq: Once | INTRAVENOUS | Status: AC
  Administered 2023-05-16: 4 g via INTRAVENOUS
  Filled 2023-05-16: qty 100

## 2023-05-16 MED ORDER — SODIUM CHLORIDE 0.9 % IV BOLUS: 1000.0000 mL | Freq: Once | INTRAVENOUS | Status: DC

## 2023-05-16 MED ORDER — SODIUM CHLORIDE 0.9 % IV SOLN
Freq: Once | INTRAVENOUS | Status: AC
  Administered 2023-05-16: 10 mL via INTRAVENOUS

## 2023-05-16 MED ORDER — LACTATED RINGERS IV SOLN: INTRAVENOUS | Status: AC

## 2023-05-16 MED ORDER — OXYBUTYNIN CHLORIDE ER 10 MG PO TB24
10.0000 mg | ORAL_TABLET | Freq: Every evening | ORAL | Status: DC
Start: 2023-05-16 — End: 2023-05-25
  Administered 2023-05-16 – 2023-05-24 (×9): 10 mg via ORAL
  Filled 2023-05-16 (×10): qty 1

## 2023-05-16 MED ORDER — ACETAMINOPHEN 325 MG PO TABS
650.0000 mg | ORAL_TABLET | Freq: Four times a day (QID) | ORAL | Status: DC | PRN
  Administered 2023-05-18 – 2023-05-22 (×3): 650 mg via ORAL
  Filled 2023-05-16 (×3): qty 2

## 2023-05-16 MED ORDER — PANTOPRAZOLE SODIUM 40 MG PO TBEC
40.0000 mg | DELAYED_RELEASE_TABLET | Freq: Every day | ORAL | Status: DC
  Administered 2023-05-17 – 2023-05-25 (×9): 40 mg via ORAL
  Filled 2023-05-16 (×9): qty 1

## 2023-05-16 NOTE — Telephone Encounter (Signed)
 I called the wife and let her know about the creat . Was 4.60 and so he did not get treatment and was sent to ER and he is in the hospital. She said that the nursing home he got a stomach bug, he probably that he was not drinking and eating good. I passed the info to [employer].

## 2023-05-16 NOTE — ED Notes (Signed)
 No reactions within the first 15 min of blood transfusion started with this RN at bedside. VSS.   Pillow provided to pt and pt repositioned, pt denies any furthers needs, call bell in reach, will continue to monitor.

## 2023-05-16 NOTE — Patient Instructions (Addendum)
 Marland Kitchen

## 2023-05-16 NOTE — Assessment & Plan Note (Addendum)
 Per chart review, patient has a history of CKD stage IIIa that progressed to stage IIIb after recent hospitalization in February with consistent creatinine ranging between 1.5 and 1.7.  At his last follow-up on March 19, creatinine already started increasing to 2.25 and is now 4.6. Likely multi-factorial. CT renal stone study in February demonstrated severely atrophic left kidney.   - Renal ultrasound  - CK  - Urinalysis - Continue IV fluids - Hold all nephrotoxic agents - Strict in/out

## 2023-05-16 NOTE — ED Notes (Signed)
 PIV attempted X1 by this RN with no success, pt states "they normally need a ultrasound".

## 2023-05-16 NOTE — Assessment & Plan Note (Signed)
 Diet-controlled type 2 diabetes with last A1c of 5.0%.  - No indication for SSI at this time

## 2023-05-16 NOTE — Assessment & Plan Note (Addendum)
 Chronic severe anemia with hemoglobin between 7-8, now 7.0.  Likely due to chemotherapy. Oncology is recommending transfusion.  - 1 unit of packed RBC ordered - Posttransfusion CBC

## 2023-05-16 NOTE — Assessment & Plan Note (Signed)
 Patient has a history of HFrEF with last EF of 45-50%.  He appears euvolemic at this time.  - Hold home Lasix in the setting of AKI - Daily weights - Strict in and out

## 2023-05-16 NOTE — Telephone Encounter (Signed)
 Spoke to [employer] at [employer]. Informed patient's creatinine came back at 4.6; patient was taken to the ER and is being admitted for acute renal failure.  RN indicated she did not have the opportunity to administer weekly Venclexta prior to patient leaving for his appointment.

## 2023-05-16 NOTE — ED Notes (Signed)
 Blood consent obtained by pt, paper consent placed with chart.

## 2023-05-16 NOTE — Progress Notes (Unsigned)
 No treatment today. Pt being transferred to ED due to creatinine level, per MD.

## 2023-05-16 NOTE — ED Provider Notes (Signed)
 [facility] Provider Note    Event Date/Time   First MD Initiated Contact with Patient 05/16/23 1122     (approximate)   History   Abnormal Lab   HPI  Robert [name] is a 78 y.o. male with history of leukemia on chemotherapy last chemo on Wednesday who comes in with concerns for elevated creatinine  I reviewed the labs from today where his CBC is elevated at 13.5 and hemoglobin is low at 7.  CMP shows creatinine that is doubled up to 4.6.   Patient reports about a week ago he had had a lot of diarrhea.  He states the diarrhea has stopped he still states that he is eating and drinking.  He denies any other new symptoms.  Denies any issues with urination.  Physical Exam   Triage Vital Signs: ED Triage Vitals [05/16/23 1057]  Encounter Vitals Group     BP 133/75     Systolic BP Percentile      Diastolic BP Percentile      Pulse Rate 88     Resp 18     Temp 97.7 F (36.5 C)     Temp Source Oral     SpO2 100 %     Weight 220 lb (99.8 kg)     Height 6\' 2"  (1.88 m)     Head Circumference      Peak Flow      Pain Score 0     Pain Loc      Pain Education      Exclude from Growth Chart     Most recent vital signs: Vitals:   05/16/23 1057  BP: 133/75  Pulse: 88  Resp: 18  Temp: 97.7 F (36.5 C)  SpO2: 100%     General: Awake, no distress.  CV:  Good peripheral perfusion.  Resp:  Normal effort.  Abd:  No distention.  Soft and nontender Other:  Moving all extremities well.  Scattered bruising noted.   ED Results / Procedures / Treatments   Labs (all labs ordered are listed, but only abnormal results are displayed) Labs Reviewed  MAGNESIUM  URINALYSIS, ROUTINE W REFLEX MICROSCOPIC  PHOSPHORUS  CK  TYPE AND SCREEN  PREPARE RBC (CROSSMATCH)      PROCEDURES:  Critical Care performed: Yes, see critical care procedure note(s)  .Critical Care  Performed by: Concha Se, MD Authorized by: Concha Se, MD   Critical  care provider statement:    Critical care time (minutes):  30   Critical care was necessary to treat or prevent imminent or life-threatening deterioration of the following conditions: sympatomatic anemia.   Critical care was time spent personally by me on the following activities:  Development of treatment plan with patient or surrogate, discussions with consultants, evaluation of patient's response to treatment, examination of patient, ordering and review of laboratory studies, ordering and review of radiographic studies, ordering and performing treatments and interventions, pulse oximetry, re-evaluation of patient's condition and review of old charts    MEDICATIONS ORDERED IN ED: Medications  sodium chloride 0.9 % bolus 1,000 mL (0 mLs Intravenous Hold 05/16/23 1215)  0.9 %  sodium chloride infusion (0 mLs Intravenous Hold 05/16/23 1215)     IMPRESSION / MDM / ASSESSMENT AND PLAN / ED COURSE  I reviewed the triage vital signs and the nursing notes.   Patient's presentation is most consistent with acute presentation with potential threat to life or bodily function.  Patient comes  in with AKI could be related to dehydration from diarrhea, no symptoms suggest urinary retention.  Could be related to chemotherapy.  Will give patient some IV fluids and check CK to evaluate for rhabdo.  Patient noted to be anemic down to 7.  Patient looks very pale I discussed with oncology who did want a give 1 unit of blood for his weakness.  11:44 AM Discussed with Dr Smith Robert who wanted pt to get irridated blood 1 unit Will admit   The patient is on the cardiac monitor to evaluate for evidence of arrhythmia and/or significant heart rate changes.      FINAL CLINICAL IMPRESSION(S) / ED DIAGNOSES   Final diagnoses:  Symptomatic anemia  AKI (acute kidney injury) (HCC)     Rx / DC Orders   ED Discharge Orders     None        Note:  This document was prepared using Dragon voice recognition  software and may include unintentional dictation errors.   Concha Se, MD 05/16/23 1255

## 2023-05-16 NOTE — Telephone Encounter (Signed)
 Voice message left.  Upon caregiver / spouse, please review previous phone note.

## 2023-05-16 NOTE — Assessment & Plan Note (Signed)
 Severe chronic thrombocytopenia in the setting of chemotherapy.  Stable at this time with no evidence of acute bleed on exam.

## 2023-05-16 NOTE — ED Notes (Signed)
 See triage note, pt mentions slight Headache but no weakness. Pt denies SOB or CP, pt is A&Ox4 at this time. NAD noted.

## 2023-05-16 NOTE — ED Triage Notes (Signed)
 Patient brought over from cancer center for abnormal creatinine level. Labs drawn at cancer center this AM. Patient has leukemia and receiving chemo. Last chemo last wednesday  Patient denies pain or SOB  Staying at [facility] at this time

## 2023-05-16 NOTE — Assessment & Plan Note (Signed)
 Continue home metoprolol.

## 2023-05-16 NOTE — ED Notes (Signed)
 Blood transfusion started.

## 2023-05-16 NOTE — H&P (Signed)
 History and Physical    Patient: Robert [name] JXB:[id number] DOB: 03/15/1945 DOA: 05/16/2023 DOS: the patient was seen and examined on 05/16/2023 PCP: Corky Downs, MD  Patient coming from: SNF  Chief Complaint:  Chief Complaint  Patient presents with   Abnormal Lab   HPI: Robert [name] is a 78 y.o. 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  [name] states that he did have 1 day of diarrhea approximately 4-5 days ago where he had 8 episodes of diarrhea, however it resolved by the next day and he has not had any more loose stool or frequent stool.  Otherwise, he notes that his oral intake has been decreased for the last month or so, and so he would not be surprised if he is dehydrated.  He denies any nausea, vomiting.  He is he notes that his urine output has decreased over the last month but no recent sudden changes; he denies any dysuria.  He denies any fever, chills, chest pain, shortness of breath, lower extremity swelling.  ED course: On arrival to the ED, patient was hypertensive at 135/90 with heart rate of 88.  He was saturating at 100% on room air.  He was afebrile at 97.7.  Initial workup notable for WBC of 13.5, hemoglobin of 7.0, platelets 41, creatinine 4.60, BUN 72, GFR of 12.  Patient started on IV fluids and 1 unit of packed RBC ordered.  TRH contacted for admission.  Review of Systems: As mentioned in the history of present illness. All other systems reviewed and are negative.  Past Medical History:  Diagnosis Date   Allergy    Cellulitis    Diabetes mellitus without complication (HCC)    Hyperlipidemia    Hypertension    Leukemia (HCC)    Peripheral vascular  disease (HCC)    Past Surgical History:  Procedure Laterality Date   BIOPSY  08/21/2022   Procedure: BIOPSY;  Surgeon: [name], DO;  Location: [facility] ENDOSCOPY;  Service: Gastroenterology;;   COLONOSCOPY WITH PROPOFOL N/A 10/02/2017   Procedure: COLONOSCOPY WITH PROPOFOL;  Surgeon: Wyline Mood, MD;  Location: [facility] ENDOSCOPY;  Service: Gastroenterology;  Laterality: N/A;   COLONOSCOPY WITH PROPOFOL N/A 08/21/2022   Procedure: COLONOSCOPY WITH PROPOFOL;  Surgeon: [name], DO;  Location: [facility] ENDOSCOPY;  Service: Gastroenterology;  Laterality: N/A;   ESOPHAGOGASTRODUODENOSCOPY (EGD) WITH PROPOFOL N/A 08/11/2022   Procedure: ESOPHAGOGASTRODUODENOSCOPY (EGD) WITH PROPOFOL;  Surgeon: Midge Minium, MD;  Location: [facility] ENDOSCOPY;  Service: Endoscopy;  Laterality: N/A;   HERNIA REPAIR     IR BONE MARROW BIOPSY & ASPIRATION  04/10/2023   PERICARDIOCENTESIS N/A 08/29/2022   Procedure: PERICARDIOCENTESIS;  Surgeon: Yvonne Kendall, MD;  Location: [facility] INVASIVE CV LAB;  Service: Cardiovascular;  Laterality: N/A;   POLYPECTOMY  08/21/2022   Procedure: POLYPECTOMY;  Surgeon: [name], DO;  Location: [facility] ENDOSCOPY;  Service: Gastroenterology;;   Social History:  reports that he has never smoked. He has never used smokeless tobacco. He reports that he does not currently use alcohol. He reports that he does not use drugs.  No Known Allergies  Family History  Problem Relation Age of Onset   Heart disease Mother    Stroke Mother    Heart  disease Father    Heart attack Father     Prior to Admission medications   Medication Sig Start Date End Date Taking? Authorizing Provider  acyclovir (ZOVIRAX) 400 MG tablet Take 1 tablet (400 mg total) by mouth 2 (two) times daily. 05/09/23   Creig Hines, MD  allopurinol (ZYLOPRIM) 300 MG tablet Take 1 tablet (300 mg total) by mouth daily. 05/01/23   Alinda Dooms, NP  BD PEN NEEDLE NANO 2ND GEN 32G X 4 MM MISC USE 1 PEN NEEDLE EVERY  DAY AS DIRECTED 01/21/21   Corky Downs, MD  bisacodyl (DULCOLAX) 10 MG suppository Place 1 suppository (10 mg total) rectally daily as needed for moderate constipation. 04/12/23   Sunnie Nielsen, DO  DULoxetine (CYMBALTA) 60 MG capsule Take 1 capsule (60 mg total) by mouth 2 (two) times daily. 04/12/23   Sunnie Nielsen, DO  ferrous sulfate 325 (65 FE) MG tablet Take 1 tablet (325 mg total) by mouth 2 (two) times daily with a meal for 14 days. 04/12/23 05/01/23  Sunnie Nielsen, DO  furosemide (LASIX) 20 MG tablet Take 1 tablet (20 mg total) by mouth daily. Increase to 1 tablet (20 mg total) by mouth TWICE daily (total daily dose 40 mg) as needed for up to 3 days for increased leg swelling, shortness of breath, weight gain 5+ lbs over 1-2 days. Seek medical care if these symptoms are not improving with increased dose. 04/12/23   Sunnie Nielsen, DO  hydrOXYzine (ATARAX) 25 MG tablet Take 1 tablet (25 mg total) by mouth 3 (three) times daily as needed for itching. 04/12/23   Sunnie Nielsen, DO  latanoprost (XALATAN) 0.005 % ophthalmic solution Place 1 drop into both eyes at bedtime. 04/12/23   Sunnie Nielsen, DO  melatonin 5 MG TABS Take 1 tablet (5 mg total) by mouth at bedtime. 04/12/23   Sunnie Nielsen, DO  metoprolol succinate (TOPROL-XL) 50 MG 24 hr tablet Take 1 tablet (50 mg total) by mouth daily. Take with or immediately following a meal. 04/12/23   Sunnie Nielsen, DO  mirtazapine (REMERON) 15 MG tablet Take 1 tablet (15 mg total) by mouth daily. 04/12/23   Sunnie Nielsen, DO  ondansetron (ZOFRAN) 4 MG tablet Take 1 tablet (4 mg total) by mouth every 6 (six) hours as needed for nausea. 04/12/23   Sunnie Nielsen, DO  ondansetron (ZOFRAN) 8 MG tablet Take 1 tablet (8 mg total) by mouth every 8 (eight) hours as needed for nausea or vomiting. 05/01/23   Alinda Dooms, NP  oxybutynin (DITROPAN-XL) 10 MG 24 hr tablet Take 1 tablet (10 mg total) by mouth every evening. 04/12/23    Sunnie Nielsen, DO  pantoprazole (PROTONIX) 40 MG tablet Take 1 tablet (40 mg total) by mouth 2 (two) times daily before a meal. 04/12/23   Sunnie Nielsen, DO  polyethylene glycol powder (GLYCOLAX/MIRALAX) 17 GM/SCOOP powder Take 17 g by mouth daily. 04/12/23   Sunnie Nielsen, DO  prochlorperazine (COMPAZINE) 10 MG tablet Take 1 tablet (10 mg total) by mouth every 6 (six) hours as needed for nausea or vomiting. 05/01/23   Alinda Dooms, NP  senna-docusate (FT STOOL SOFTENER) 8.6-50 MG tablet Take 2 tablets by mouth daily as needed for mild constipation. 04/12/23   Sunnie Nielsen, DO  simvastatin (ZOCOR) 20 MG tablet Take 1 tablet (20 mg total) by mouth daily. 04/12/23   Sunnie Nielsen, DO  sodium phosphate (FLEET) ENEM Place 133 mLs (1 enema total) rectally daily as needed for severe constipation.  04/12/23   Sunnie Nielsen, DO  venetoclax (VENCLEXTA) 100 MG tablet Take 4 tablets (400 mg total) by mouth once a week. Tablets should be swallowed whole with a meal and a full glass of water. 05/02/23   Jeralyn Ruths, MD    Physical Exam: Vitals:   05/16/23 1057  BP: 133/75  Pulse: 88  Resp: 18  Temp: 97.7 F (36.5 C)  TempSrc: Oral  SpO2: 100%  Weight: 99.8 kg  Height: 6\' 2"  (1.88 m)   Physical Exam Vitals and nursing note reviewed.  Constitutional:      General: He is not in acute distress.    Appearance: He is obese.  HENT:     Head: Normocephalic and atraumatic.     Mouth/Throat:     Mouth: Mucous membranes are dry.     Pharynx: Oropharynx is clear.  Cardiovascular:     Rate and Rhythm: Normal rate and regular rhythm.     Heart sounds: No murmur heard. Pulmonary:     Effort: Pulmonary effort is normal. No respiratory distress.     Breath sounds: Normal breath sounds. No wheezing, rhonchi or rales.  Abdominal:     General: Bowel sounds are normal. There is no distension.     Palpations: Abdomen is soft.     Tenderness: There is no abdominal  tenderness. There is no guarding.  Musculoskeletal:     Right lower leg: Edema (+1, chronic per patient) present.     Left lower leg: No edema.  Skin:    General: Skin is warm and dry.     Findings: Bruising (Bilateral upper extremities in various stages of healing) present.  Neurological:     Mental Status: He is alert and oriented to person, place, and time. Mental status is at baseline.  Psychiatric:        Mood and Affect: Mood normal.        Behavior: Behavior normal.    Data Reviewed: CBC with WBC of 13.5, hemoglobin of 7.0, platelets of 41 CMP with sodium of 139, potassium 4.2, bicarb 20, glucose 112, BUN 72, creatinine 4.6, AST 22, ALT 11, GFR 12.  Results are pending, will review when available.  Assessment and Plan:  * AKI (acute kidney injury) (HCC) Per chart review, patient has a history of CKD stage IIIa that progressed to stage IIIb after recent hospitalization in February with consistent creatinine ranging between 1.5 and 1.7.  At his last follow-up on March 19, creatinine already started increasing to 2.25 and is now 4.6. Likely multi-factorial. CT renal stone study in February demonstrated severely atrophic left kidney.   - Renal ultrasound  - CK  - Urinalysis - Continue IV fluids - Hold all nephrotoxic agents - Strict in/out  Normocytic anemia Chronic severe anemia with hemoglobin between 7-8, now 7.0.  Likely due to chemotherapy. Oncology is recommending transfusion.  - 1 unit of packed RBC ordered - Posttransfusion CBC  AML (acute myeloblastic leukemia) (HCC) Patient has a history ofRecently diagnosed acute myeloid leukemia on Venetoclax and Decitabine.   - Oncology consult; appreciate their recommendations  Chronic systolic CHF (congestive heart failure) (HCC) Patient has a history of HFrEF with last EF of 45-50%.  He appears euvolemic at this time.  - Hold home Lasix in the setting of AKI - Daily weights - Strict in and out  Thrombocytopenia  (HCC) Severe chronic thrombocytopenia in the setting of chemotherapy.  Stable at this time with no evidence of acute bleed on exam.  Type 2 diabetes mellitus with complication (HCC) Diet-controlled type 2 diabetes with last A1c of 5.0%.  - No indication for SSI at this time  Essential hypertension - Continue home metoprolol  Advance Care Planning:   Code Status: Full Code confirmed by patient.  He states that his outlook on life has changed after being diagnosed with 2 cancers in the last 6 months.  He states that his will to live is higher than its ever been.  He confirms that he would like to reverse his prior DNR.  Consults: Oncology  Family Communication: No family at bedside  Severity of Illness: The appropriate patient status for this patient is OBSERVATION. Observation status is judged to be reasonable and necessary in order to provide the required intensity of service to ensure the patient's safety. The patient's presenting symptoms, physical exam findings, and initial radiographic and laboratory data in the context of their medical condition is felt to place them at decreased risk for further clinical deterioration. Furthermore, it is anticipated that the patient will be medically stable for discharge from the hospital within 2 midnights of admission.   Author: Verdene Lennert, MD 05/16/2023 1:07 PM  For on call review [email or web address].

## 2023-05-16 NOTE — ED Notes (Signed)
 [place] at bedside

## 2023-05-16 NOTE — ED Notes (Signed)
 IV team consult placed for access

## 2023-05-16 NOTE — ED Notes (Signed)
 This patient was on a purewick upon arrival to [facility] and discarded since it was soiled by stool. Pt was cleaned and a new one was placed due to frequent incontinence.

## 2023-05-16 NOTE — Progress Notes (Signed)
 Hematology/Oncology Consult note [facility]  [phone] [phone]  Patient Care Team: Corky Downs, MD as PCP - General (Internal Medicine) Creig Hines, MD as Consulting Physician (Oncology)   Name of the patient: Robert [name]  [id number]  05-01-1945   Date of visit: 05/16/23  Diagnosis-AML currently not in remission  Chief complaint/ Reason for visit-on treatment assessment prior to cycle 2 of decitabine and venetoclax  Heme/Onc history: Patient is a 78 year old male with past medical history significant for hypertension hyperlipidemia type 2 diabetes peripheral arterial disease who is currently admitted to the hospital for symptoms of recurrent UTI and ESBL sepsis.  He was seen by me in January 2025 for posthospital discharge follow-up for pancytopenia.His hemoglobin from last year was 13 and had dropped down to 8.5 in June 2024. At that time his ferritin levels were normal at 121 but iron saturation low at 17% with a normal TIBC. Subsequently his iron saturation dropped to 13%. B12 levels and folate levels have been normal. We do not have any hemoglobin levels between last year and this year.  His anemia at that time was attributed to chronic disease but as planning to get a bone marrow biopsy down the line if the results of peripheral blood work did not reveal any discerning etiology.B12 level, TSH and haptoglobin at that time were normal.  Myeloma panel showed polyclonal increase in immunoglobulins but no monoclonal protein.  Serum free light chains showed both kappa and lambda light chain elevation but total ratio was normal.  Folate mildly low at 5.7.  Ferritin levels elevated at 1439   He underwent a colonoscopy by Dr. Timothy Lasso on 08/21/2022 due to concerns of anemia and he was found to have a 5 cm sigmoid mass which was biopsied and was consistent with intramucosal adenocarcinoma with focal area suspicious for at least superficial  invasion into the muscularis mucosa. There was another flat lesion noted in the ascending colon which was not biopsied but was concerning in appearance. 3 other polyps in the ascending colon positive for tubular adenoma. Patient underwent Sigmoid colon partial colectomy on 01/01/2023 at [facility].  Final pathology showed invasive adenocarcinoma moderately differentiated with negative margins.  15 lymph nodes negative for malignancy.  pT1 N0   Given his persistent pancytopenia patient underwent bone marrow Biopsy which was consistent with acute myeloid leukemia with total blasts ranging between 20 to 30%.  Overall bone marrow was hypercellular 75% with significantly left shifted both myeloid and erythroid lineages.Morphologically difficult to a certain definitive blasts from immature precursors.  Greater than 20% were identified however on aspirate smear slides, 17% by flow cytometry and overall 30% by CD34 IHC.  Significantly increased CD1 1 7 positive immature precursors.  Findings consistent with AML.  This could be AML arising from MDS. Normal cytogenetics.  [facility] showed ASX cell 1, IDH 2, SRS F2, STA G2.  No mutation seen CALR, flit 3, IDH 1, JAK2, MPL, NPM1 or T p53   He is presently on metronomic weekly venetoclax plus decitabine  Interval history-patient reports no significant change in his health status over the last 1 week.  No falls nausea vomiting diarrhea or fever.  ECOG PS- 3 Pain scale- 0   Review of systems- Review of Systems  Constitutional:  Negative for chills, fever, malaise/fatigue and weight loss.  HENT:  Negative for congestion, ear discharge and nosebleeds.   Eyes:  Negative for blurred vision.  Respiratory:  Negative for cough, hemoptysis, sputum production,  shortness of breath and wheezing.   Cardiovascular:  Negative for chest pain, palpitations, orthopnea and claudication.  Gastrointestinal:  Negative for abdominal pain, blood in stool, constipation, diarrhea, heartburn,  melena, nausea and vomiting.  Genitourinary:  Negative for dysuria, flank pain, frequency, hematuria and urgency.  Musculoskeletal:  Negative for back pain, joint pain and myalgias.  Skin:  Negative for rash.  Neurological:  Negative for dizziness, tingling, focal weakness, seizures, weakness and headaches.  Endo/Heme/Allergies:  Does not bruise/bleed easily.  Psychiatric/Behavioral:  Negative for depression and suicidal ideas. The patient does not have insomnia.       No Known Allergies   Past Medical History:  Diagnosis Date   Allergy    Cellulitis    Diabetes mellitus without complication (HCC)    Hyperlipidemia    Hypertension    Leukemia (HCC)    Peripheral vascular disease (HCC)      Past Surgical History:  Procedure Laterality Date   BIOPSY  08/21/2022   Procedure: BIOPSY;  Surgeon: [name], DO;  Location: [facility] ENDOSCOPY;  Service: Gastroenterology;;   COLONOSCOPY WITH PROPOFOL N/A 10/02/2017   Procedure: COLONOSCOPY WITH PROPOFOL;  Surgeon: Wyline Mood, MD;  Location: [facility] ENDOSCOPY;  Service: Gastroenterology;  Laterality: N/A;   COLONOSCOPY WITH PROPOFOL N/A 08/21/2022   Procedure: COLONOSCOPY WITH PROPOFOL;  Surgeon: [name], DO;  Location: [facility] ENDOSCOPY;  Service: Gastroenterology;  Laterality: N/A;   ESOPHAGOGASTRODUODENOSCOPY (EGD) WITH PROPOFOL N/A 08/11/2022   Procedure: ESOPHAGOGASTRODUODENOSCOPY (EGD) WITH PROPOFOL;  Surgeon: Midge Minium, MD;  Location: [facility] ENDOSCOPY;  Service: Endoscopy;  Laterality: N/A;   HERNIA REPAIR     IR BONE MARROW BIOPSY & ASPIRATION  04/10/2023   PERICARDIOCENTESIS N/A 08/29/2022   Procedure: PERICARDIOCENTESIS;  Surgeon: Yvonne Kendall, MD;  Location: [facility] INVASIVE CV LAB;  Service: Cardiovascular;  Laterality: N/A;   POLYPECTOMY  08/21/2022   Procedure: POLYPECTOMY;  Surgeon: [name], DO;  Location: [facility] ENDOSCOPY;  Service: Gastroenterology;;    Social History   Socioeconomic History    Marital status: Married    Spouse name: Not on file   Number of children: 0   Years of education: College Gradute   Highest education level: Bachelor's degree (e.g., BA, AB, BS)  Occupational History   Not on file  Tobacco Use   Smoking status: Never   Smokeless tobacco: Never  Vaping Use   Vaping status: Never Used  Substance and Sexual Activity   Alcohol use: Not Currently    Comment: Occasionally   Drug use: No   Sexual activity: Not Currently  Other Topics Concern   Not on file  Social History Narrative   Not on file   Social Drivers of Health   Financial Resource Strain: Low Risk  (12/15/2022)   Received from [facility]   Overall Financial Resource Strain (CARDIA)    Difficulty of Paying Living Expenses: Not hard at all  Food Insecurity: Patient Unable To Answer (04/04/2023)   Hunger Vital Sign    Worried About Running Out of Food in the Last Year: Patient unable to answer    Ran Out of Food in the Last Year: Patient unable to answer  Transportation Needs: Patient Unable To Answer (04/04/2023)   PRAPARE - Transportation    Lack of Transportation (Medical): Patient unable to answer    Lack of Transportation (Non-Medical): Patient unable to answer  Physical Activity: Inactive (10/29/2020)   Exercise Vital Sign    Days of Exercise per Week: 0 days  Minutes of Exercise per Session: 0 min  Stress: No Stress Concern Present (10/29/2020)   [name] of Occupational Health - Occupational Stress Questionnaire    Feeling of Stress : Not at all  Social Connections: Patient Unable To Answer (04/04/2023)   Social Connection and Isolation Panel [NHANES]    Frequency of Communication with Friends and Family: Patient unable to answer    Frequency of Social Gatherings with Friends and Family: Patient unable to answer    Attends Religious Services: Patient unable to answer    Active Member of Clubs or Organizations: Patient unable to answer    Attends Tax inspector Meetings: Patient unable to answer    Marital Status: Patient unable to answer  Intimate Partner Violence: Not At Risk (04/04/2023)   Humiliation, Afraid, Rape, and Kick questionnaire    Fear of Current or Ex-Partner: No    Emotionally Abused: No    Physically Abused: No    Sexually Abused: No    Family History  Problem Relation Age of Onset   Heart disease Mother    Stroke Mother    Heart disease Father    Heart attack Father     No current facility-administered medications for this visit.  Current Outpatient Medications:    acyclovir (ZOVIRAX) 400 MG tablet, Take 1 tablet (400 mg total) by mouth 2 (two) times daily., Disp: 60 tablet, Rfl: 1   allopurinol (ZYLOPRIM) 300 MG tablet, Take 1 tablet (300 mg total) by mouth daily., Disp: 30 tablet, Rfl: 2   BD PEN NEEDLE NANO 2ND GEN 32G X 4 MM MISC, USE 1 PEN NEEDLE EVERY DAY AS DIRECTED, Disp: 100 each, Rfl: 4   bisacodyl (DULCOLAX) 10 MG suppository, Place 1 suppository (10 mg total) rectally daily as needed for moderate constipation., Disp: 8 suppository, Rfl: 0   DULoxetine (CYMBALTA) 60 MG capsule, Take 1 capsule (60 mg total) by mouth 2 (two) times daily., Disp: 14 capsule, Rfl: 0   ferrous sulfate 325 (65 FE) MG tablet, Take 1 tablet (325 mg total) by mouth 2 (two) times daily with a meal for 14 days., Disp: 28 tablet, Rfl: 0   furosemide (LASIX) 20 MG tablet, Take 1 tablet (20 mg total) by mouth daily. Increase to 1 tablet (20 mg total) by mouth TWICE daily (total daily dose 40 mg) as needed for up to 3 days for increased leg swelling, shortness of breath, weight gain 5+ lbs over 1-2 days. Seek medical care if these symptoms are not improving with increased dose., Disp: 14 tablet, Rfl: 0   hydrOXYzine (ATARAX) 25 MG tablet, Take 1 tablet (25 mg total) by mouth 3 (three) times daily as needed for itching., Disp: 10 tablet, Rfl: 0   latanoprost (XALATAN) 0.005 % ophthalmic solution, Place 1 drop into both eyes at bedtime.,  Disp: 2.5 mL, Rfl: 0   melatonin 5 MG TABS, Take 1 tablet (5 mg total) by mouth at bedtime., Disp: 7 tablet, Rfl: 0   metoprolol succinate (TOPROL-XL) 50 MG 24 hr tablet, Take 1 tablet (50 mg total) by mouth daily. Take with or immediately following a meal., Disp: 7 tablet, Rfl: 0   mirtazapine (REMERON) 15 MG tablet, Take 1 tablet (15 mg total) by mouth daily., Disp: 7 tablet, Rfl: 0   ondansetron (ZOFRAN) 4 MG tablet, Take 1 tablet (4 mg total) by mouth every 6 (six) hours as needed for nausea., Disp: 20 tablet, Rfl: 0   ondansetron (ZOFRAN) 8 MG tablet, Take 1 tablet (  8 mg total) by mouth every 8 (eight) hours as needed for nausea or vomiting., Disp: 30 tablet, Rfl: 1   oxybutynin (DITROPAN-XL) 10 MG 24 hr tablet, Take 1 tablet (10 mg total) by mouth every evening., Disp: 7 tablet, Rfl: 0   pantoprazole (PROTONIX) 40 MG tablet, Take 1 tablet (40 mg total) by mouth 2 (two) times daily before a meal., Disp: 14 tablet, Rfl: 0   polyethylene glycol powder (GLYCOLAX/MIRALAX) 17 GM/SCOOP powder, Take 17 g by mouth daily., Disp: 238 g, Rfl: 0   prochlorperazine (COMPAZINE) 10 MG tablet, Take 1 tablet (10 mg total) by mouth every 6 (six) hours as needed for nausea or vomiting., Disp: 30 tablet, Rfl: 1   senna-docusate (FT STOOL SOFTENER) 8.6-50 MG tablet, Take 2 tablets by mouth daily as needed for mild constipation., Disp: 14 tablet, Rfl: 0   simvastatin (ZOCOR) 20 MG tablet, Take 1 tablet (20 mg total) by mouth daily., Disp: 7 tablet, Rfl: 0   sodium phosphate (FLEET) ENEM, Place 133 mLs (1 enema total) rectally daily as needed for severe constipation., Disp: 133 mL, Rfl: 0   venetoclax (VENCLEXTA) 100 MG tablet, Take 4 tablets (400 mg total) by mouth once a week. Tablets should be swallowed whole with a meal and a full glass of water., Disp: 16 tablet, Rfl: 0  Facility-Administered Medications Ordered in Other Visits:    0.9 %  sodium chloride infusion, , Intravenous, Once, Concha Se, MD, Held at  05/16/23 1215   sodium chloride 0.9 % bolus 1,000 mL, 1,000 mL, Intravenous, Once, Concha Se, MD, Held at 05/16/23 1215  Physical exam:  Vitals:   05/16/23 0948  BP: (!) 135/90  Pulse: 91  Resp: 19  Temp: (!) 95.7 F (35.4 C)  TempSrc: Tympanic  SpO2: 100%  Weight: 220 lb 3.2 oz (99.9 kg)  Height: 6\' 2"  (1.88 m)   Physical Exam Cardiovascular:     Rate and Rhythm: Normal rate and regular rhythm.     Heart sounds: Normal heart sounds.  Pulmonary:     Effort: Pulmonary effort is normal.     Breath sounds: Normal breath sounds.  Abdominal:     General: Bowel sounds are normal.     Palpations: Abdomen is soft.  Musculoskeletal:     Right lower leg: No edema.     Left lower leg: No edema.  Skin:    General: Skin is warm and dry.  Neurological:     Mental Status: He is alert and oriented to person, place, and time.         Latest Ref Rng & Units 05/16/2023    9:21 AM  CMP  Glucose 70 - 99 mg/dL 161   BUN 8 - 23 mg/dL 72   Creatinine 0.96 - 1.24 mg/dL 0.45   Sodium 409 - 811 mmol/L 139   Potassium 3.5 - 5.1 mmol/L 4.2   Chloride 98 - 111 mmol/L 104   CO2 22 - 32 mmol/L 20   Calcium 8.9 - 10.3 mg/dL 6.8   Total Protein 6.5 - 8.1 g/dL 7.4   Total Bilirubin 0.0 - 1.2 mg/dL 0.8   Alkaline Phos 38 - 126 U/L 105   AST 15 - 41 U/L 22   ALT 0 - 44 U/L 11       Latest Ref Rng & Units 05/16/2023    9:21 AM  CBC  WBC 4.0 - 10.5 K/uL 13.5   Hemoglobin 13.0 - 17.0 g/dL 7.0  Hematocrit 39.0 - 52.0 % 23.4   Platelets 150 - 400 K/uL 41     No images are attached to the encounter.  No results found.   Assessment and plan- Patient is a 78 y.o. male with history of  AML NPML1, FLT3, TP53 -, IDH2 positive.  He is here for on treatment assessment prior to cycle 2 of weekly decitabine and venetoclax  Patient's creatinine was elevated at 2.25 last week as compared to baseline of 1.5.  He has received 1 dose of venetoclax so far.  Serum creatinine is further higher today at  4.6.  I am holding off on giving him both at decitabine and venetoclax at this time.  He will need to go to the ER for his AKI.  Outpatient follow-up with me to be decided based on hospital outcome.  Symptomatic anemia: Hemoglobin is decreased from 8.1-7 presently.  He will need 1 unit of PRBC transfusion while he is admitted   Visit Diagnosis 1. Acute myeloid leukemia not having achieved remission (HCC)   2. High risk medication use   3. AKI (acute kidney injury) (HCC)   4. Encounter for antineoplastic chemotherapy      Dr. Owens Shark, MD, MPH [facility] at [facility] [phone] 05/16/2023 12:30 PM

## 2023-05-16 NOTE — ED Notes (Signed)
 Lab at bedside

## 2023-05-16 NOTE — Assessment & Plan Note (Signed)
 Patient has a history ofRecently diagnosed acute myeloid leukemia on Venetoclax and Decitabine.   - Oncology consult; appreciate their recommendations

## 2023-05-16 NOTE — Progress Notes (Signed)
 Patient's creatinine level is 4.6- per Dr. Smith Robert, md recommended holding venclexta and decitabine. Hgb 7.0. pt requires admission to hospital. RN Transported to ER via w/c in stable condition. Hand off given to [facility], RN in ER.  Mercedes, RN called [facility] to provide hand off to nursing station and also confirmed with facility pt had not yet taken the am dose of venclexta today b/c patient left facility prior to med given.

## 2023-05-17 ENCOUNTER — Other Ambulatory Visit: Payer: Self-pay

## 2023-05-17 ENCOUNTER — Inpatient Hospital Stay

## 2023-05-17 ENCOUNTER — Encounter: Payer: Self-pay | Admitting: Oncology

## 2023-05-17 DIAGNOSIS — E118 Type 2 diabetes mellitus with unspecified complications: Secondary | ICD-10-CM | POA: Diagnosis not present

## 2023-05-17 DIAGNOSIS — D696 Thrombocytopenia, unspecified: Secondary | ICD-10-CM | POA: Diagnosis not present

## 2023-05-17 DIAGNOSIS — N1832 Chronic kidney disease, stage 3b: Secondary | ICD-10-CM | POA: Diagnosis present

## 2023-05-17 DIAGNOSIS — D509 Iron deficiency anemia, unspecified: Secondary | ICD-10-CM | POA: Diagnosis present

## 2023-05-17 DIAGNOSIS — E1151 Type 2 diabetes mellitus with diabetic peripheral angiopathy without gangrene: Secondary | ICD-10-CM | POA: Diagnosis present

## 2023-05-17 DIAGNOSIS — R531 Weakness: Secondary | ICD-10-CM

## 2023-05-17 DIAGNOSIS — Z9049 Acquired absence of other specified parts of digestive tract: Secondary | ICD-10-CM | POA: Diagnosis not present

## 2023-05-17 DIAGNOSIS — E663 Overweight: Secondary | ICD-10-CM | POA: Diagnosis present

## 2023-05-17 DIAGNOSIS — E8809 Other disorders of plasma-protein metabolism, not elsewhere classified: Secondary | ICD-10-CM

## 2023-05-17 DIAGNOSIS — Z85038 Personal history of other malignant neoplasm of large intestine: Secondary | ICD-10-CM | POA: Diagnosis not present

## 2023-05-17 DIAGNOSIS — I5022 Chronic systolic (congestive) heart failure: Secondary | ICD-10-CM | POA: Diagnosis present

## 2023-05-17 DIAGNOSIS — N261 Atrophy of kidney (terminal): Secondary | ICD-10-CM | POA: Diagnosis present

## 2023-05-17 DIAGNOSIS — N170 Acute kidney failure with tubular necrosis: Secondary | ICD-10-CM | POA: Diagnosis present

## 2023-05-17 DIAGNOSIS — E1122 Type 2 diabetes mellitus with diabetic chronic kidney disease: Secondary | ICD-10-CM | POA: Diagnosis present

## 2023-05-17 DIAGNOSIS — C9200 Acute myeloblastic leukemia, not having achieved remission: Secondary | ICD-10-CM | POA: Diagnosis present

## 2023-05-17 DIAGNOSIS — N179 Acute kidney failure, unspecified: Secondary | ICD-10-CM | POA: Diagnosis present

## 2023-05-17 DIAGNOSIS — D631 Anemia in chronic kidney disease: Secondary | ICD-10-CM | POA: Diagnosis present

## 2023-05-17 DIAGNOSIS — Z823 Family history of stroke: Secondary | ICD-10-CM | POA: Diagnosis not present

## 2023-05-17 DIAGNOSIS — E785 Hyperlipidemia, unspecified: Secondary | ICD-10-CM | POA: Diagnosis present

## 2023-05-17 DIAGNOSIS — D6959 Other secondary thrombocytopenia: Secondary | ICD-10-CM | POA: Diagnosis present

## 2023-05-17 DIAGNOSIS — Z8249 Family history of ischemic heart disease and other diseases of the circulatory system: Secondary | ICD-10-CM | POA: Diagnosis not present

## 2023-05-17 DIAGNOSIS — T451X5A Adverse effect of antineoplastic and immunosuppressive drugs, initial encounter: Secondary | ICD-10-CM | POA: Diagnosis present

## 2023-05-17 DIAGNOSIS — E871 Hypo-osmolality and hyponatremia: Secondary | ICD-10-CM | POA: Diagnosis not present

## 2023-05-17 DIAGNOSIS — N1831 Chronic kidney disease, stage 3a: Secondary | ICD-10-CM | POA: Diagnosis not present

## 2023-05-17 DIAGNOSIS — Z6828 Body mass index (BMI) 28.0-28.9, adult: Secondary | ICD-10-CM | POA: Diagnosis not present

## 2023-05-17 DIAGNOSIS — I130 Hypertensive heart and chronic kidney disease with heart failure and stage 1 through stage 4 chronic kidney disease, or unspecified chronic kidney disease: Secondary | ICD-10-CM | POA: Diagnosis present

## 2023-05-17 DIAGNOSIS — D611 Drug-induced aplastic anemia: Secondary | ICD-10-CM | POA: Diagnosis present

## 2023-05-17 DIAGNOSIS — E872 Acidosis, unspecified: Secondary | ICD-10-CM | POA: Diagnosis present

## 2023-05-17 LAB — CBC
HCT: 20.5 % — ABNORMAL LOW (ref 39.0–52.0)
Hemoglobin: 6.7 g/dL — ABNORMAL LOW (ref 13.0–17.0)
MCH: 28.5 pg (ref 26.0–34.0)
MCHC: 32.7 g/dL (ref 30.0–36.0)
MCV: 87.2 fL (ref 80.0–100.0)
Platelets: 41 10*3/uL — ABNORMAL LOW (ref 150–400)
RBC: 2.35 MIL/uL — ABNORMAL LOW (ref 4.22–5.81)
RDW: 18.2 % — ABNORMAL HIGH (ref 11.5–15.5)
WBC: 13.1 10*3/uL — ABNORMAL HIGH (ref 4.0–10.5)
nRBC: 0.2 % (ref 0.0–0.2)

## 2023-05-17 LAB — BASIC METABOLIC PANEL WITH GFR
Anion gap: 12 (ref 5–15)
BUN: 71 mg/dL — ABNORMAL HIGH (ref 8–23)
CO2: 20 mmol/L — ABNORMAL LOW (ref 22–32)
Calcium: 6.7 mg/dL — ABNORMAL LOW (ref 8.9–10.3)
Chloride: 106 mmol/L (ref 98–111)
Creatinine, Ser: 4.42 mg/dL — ABNORMAL HIGH (ref 0.61–1.24)
GFR, Estimated: 13 mL/min — ABNORMAL LOW (ref >60)
Glucose, Bld: 93 mg/dL (ref 70–99)
Potassium: 3.9 mmol/L (ref 3.5–5.1)
Sodium: 138 mmol/L (ref 135–145)

## 2023-05-17 LAB — MAGNESIUM: Magnesium: 1.7 mg/dL (ref 1.7–2.4)

## 2023-05-17 LAB — HEMOGLOBIN AND HEMATOCRIT, BLOOD
HCT: 23.5 % — ABNORMAL LOW (ref 39.0–52.0)
Hemoglobin: 7.3 g/dL — ABNORMAL LOW (ref 13.0–17.0)

## 2023-05-17 NOTE — Plan of Care (Signed)

## 2023-05-17 NOTE — Progress Notes (Addendum)
 Progress Note   Patient: Robert [name]:[id number] DOB: Dec 18, 1945 DOA: 05/16/2023     0 DOS: the patient was seen and examined on 05/17/2023   Brief hospital course: Robert [name] is a 78 y.o. 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  His Hb level 7.0, kidney function worsening.   Assessment and Plan: * Acute on CKD stage 3B: Baseline creatinine ranging between around 2.25 and is now 4.6.  CT renal stone study in February demonstrated severely atrophic left kidney. Renal ultrasound medical renal disease. CK - 56. Urinalysis abnormal, no complaints. Kidney function improving gradually with fluids. Continue gentle IV fluids. Monitor daily renal function. Hold all nephrotoxic agents. Monitor strict in/out  Normocytic anemia Chronic severe anemia with hemoglobin between 7-8, now 7.0.  Likely due to chemotherapy. Oncology is recommended transfusion. S/p1 unit of packed RBC. Posttransfusion CBC 6.9, Will repeat H/H and plan to transfuse.  Hypomagnesemia- Improved with replacements.  Hypocalcemia- Calcium low, but corrected to low albumin.  AML (acute myeloblastic leukemia) (HCC) Patient has a history of recently diagnosed acute myeloid leukemia on Venetoclax and Decitabine. Oncology saw him yesterday in clinic. Hold chemo for now. Continue to follow up outpatient.  Chronic systolic CHF (congestive heart failure) (HCC) Patient has a history of HFrEF with last EF of 45-50%.  He appears euvolemic at this time. Hold home Lasix in the setting of AKI Monitor Daily weights, strict in and out  Thrombocytopenia (HCC) Severe chronic thrombocytopenia in the setting of chemotherapy.  Stable at  this time with no evidence of acute bleed on exam.  Type 2 diabetes mellitus with complication (HCC) Diet-controlled type 2 diabetes with last A1c of 5.0%.  Essential hypertension Continue home metoprolol     He is resident on SNF. Out of bed to chair. Incentive spirometry. Nursing supportive care. Fall, aspiration precautions. Diet:  Diet Orders (From admission, onward)     Start     Ordered   05/16/23 1308  Diet regular Room service appropriate? Yes; Fluid consistency: Thin  Diet effective now       Question Answer Comment  Room service appropriate? Yes   Fluid consistency: Thin      05/16/23 1307           DVT prophylaxis: SCDs Start: 05/16/23 1308  Level of care: med-surg   Code Status: Full Code  Subjective: Patient is seen and examined today morning. He is feeling weak. Denies chest pain, shortness of breath, abdominal pain. Urine output low. Eating fair.  Physical Exam: Vitals:   05/16/23 1900 05/16/23 2053 05/16/23 2055 05/17/23 0748  BP: 131/83 127/77  (!) 104/43  Pulse: 87 85  86  Resp: (!) 22 20  17   Temp:  (!) 97.4 F (36.3 C)  97.8 F (36.6 C)  TempSrc:      SpO2: 99% 98%  99%  Weight:   95.9 kg   Height:   6\' 2"  (1.88 m)     General - Elderly weak Caucasian male, no apparent distress HEENT - PERRLA, EOMI, atraumatic head, non tender sinuses. Lung - distant breath sounds, basal rales, no rhonchi, wheezes. Heart - S1, S2 heard, no murmurs, rubs, trace pedal edema. Abdomen - Soft, non tender, obese, bowel sounds  good Neuro - Alert, awake and oriented x 3, non focal exam. Skin - Warm and dry.  Data Reviewed:      Latest Ref Rng & Units 05/17/2023    5:03 AM 05/16/2023    9:39 PM 05/16/2023    9:21 AM  CBC  WBC 4.0 - 10.5 K/uL 13.1  11.4  13.5   Hemoglobin 13.0 - 17.0 g/dL 6.7  7.0  7.0   Hematocrit 39.0 - 52.0 % 20.5  22.8  23.4   Platelets 150 - 400 K/uL 41  41  41       Latest Ref Rng & Units 05/17/2023    5:03 AM 05/16/2023    9:21  AM 05/09/2023   10:48 AM  BMP  Glucose 70 - 99 mg/dL 93  130  865   BUN 8 - 23 mg/dL 71  72  39   Creatinine 0.61 - 1.24 mg/dL 7.84  6.96  2.95   Sodium 135 - 145 mmol/L 138  139  139   Potassium 3.5 - 5.1 mmol/L 3.9  4.2  3.8   Chloride 98 - 111 mmol/L 106  104  105   CO2 22 - 32 mmol/L 20  20  24    Calcium 8.9 - 10.3 mg/dL 6.7  6.8  7.8    US RENAL Result Date: 05/16/2023 CLINICAL DATA:  Acute renal failure EXAM: RENAL / URINARY TRACT ULTRASOUND COMPLETE COMPARISON:  Renal CT 04/06/2023 FINDINGS: Right Kidney: Renal measurements: 13.0 x 6.9 x 7.4 cm = volume: 347 mL. Normal renal cortical thickness. Increased cortical echogenicity. No mass or hydronephrosis. Left Kidney: Renal measurements: 9.0 x 2.4 x 3.1 cm = volume: 36 mL. Renal cortical thinning. Increased cortical echogenicity. No mass or hydronephrosis. Bladder: Not well visualized. Other: None. IMPRESSION: 1. No hydronephrosis. 2. Increased cortical echogenicity as can be seen with chronic medical renal disease. 3. No definite mass identified within the right kidney to correspond with the mass described on recent CT 04/06/2023. Consider further evaluation with contrast-enhanced CT or MRI as clinically warranted. Electronically Signed   By: Annia Belt M.D.   On: 05/16/2023 14:29    Family Communication: Discussed with patient, he understand and agree. All questions answered.  Disposition: Status is: Observation The patient will require care spanning > 2 midnights and should be moved to inpatient because: AKI  Planned Discharge Destination: Skilled nursing facility     Time spent: 38 minutes  Author: Marcelino Duster, MD 05/17/2023 1:54 PM Secure chat 7am to 7pm For on call review [email or web address].

## 2023-05-18 ENCOUNTER — Ambulatory Visit: Payer: Medicare Other | Admitting: Urology

## 2023-05-18 DIAGNOSIS — N179 Acute kidney failure, unspecified: Secondary | ICD-10-CM | POA: Diagnosis not present

## 2023-05-18 LAB — BASIC METABOLIC PANEL WITH GFR
Anion gap: 11 (ref 5–15)
BUN: 69 mg/dL — ABNORMAL HIGH (ref 8–23)
CO2: 20 mmol/L — ABNORMAL LOW (ref 22–32)
Calcium: 6.8 mg/dL — ABNORMAL LOW (ref 8.9–10.3)
Chloride: 104 mmol/L (ref 98–111)
Creatinine, Ser: 4.38 mg/dL — ABNORMAL HIGH (ref 0.61–1.24)
GFR, Estimated: 13 mL/min — ABNORMAL LOW (ref >60)
Glucose, Bld: 116 mg/dL — ABNORMAL HIGH (ref 70–99)
Potassium: 3.7 mmol/L (ref 3.5–5.1)
Sodium: 135 mmol/L (ref 135–145)

## 2023-05-18 LAB — CBC
HCT: 22 % — ABNORMAL LOW (ref 39.0–52.0)
Hemoglobin: 7 g/dL — ABNORMAL LOW (ref 13.0–17.0)
MCH: 28 pg (ref 26.0–34.0)
MCHC: 31.8 g/dL (ref 30.0–36.0)
MCV: 88 fL (ref 80.0–100.0)
Platelets: 46 10*3/uL — ABNORMAL LOW (ref 150–400)
RBC: 2.5 MIL/uL — ABNORMAL LOW (ref 4.22–5.81)
RDW: 18.3 % — ABNORMAL HIGH (ref 11.5–15.5)
WBC: 13.9 10*3/uL — ABNORMAL HIGH (ref 4.0–10.5)
nRBC: 0.3 % — ABNORMAL HIGH (ref 0.0–0.2)

## 2023-05-18 LAB — PREPARE RBC (CROSSMATCH)

## 2023-05-18 MED ORDER — SODIUM CHLORIDE 0.9% IV SOLUTION: Freq: Once | INTRAVENOUS | Status: DC

## 2023-05-18 MED ORDER — SODIUM CHLORIDE 0.9% IV SOLUTION: Freq: Once | INTRAVENOUS | Status: AC

## 2023-05-18 NOTE — Plan of Care (Signed)

## 2023-05-18 NOTE — Plan of Care (Signed)
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
## 2023-05-18 NOTE — Progress Notes (Signed)
 Progress Note   Patient: Robert [name]:[id number] DOB: January 22, 1946 DOA: 05/16/2023     1 DOS: the patient was seen and examined on 05/18/2023   Brief hospital course: Robert [name] is a 78 y.o. 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  His Hb level 7.0, kidney function worsening.   Assessment and Plan: * Acute on CKD stage 3B: Baseline creatinine ranging between around 2.25 and is now 4.6.  CT renal stone study in February demonstrated severely atrophic left kidney. Renal ultrasound medical renal disease. CK - 56. Urinalysis abnormal, no complaints. Kidney function improving gradually with fluids. Continue gentle IV fluids at the rate of 50 mL an hour. Monitor daily renal function. Mild improvement in creatinine. Hold all nephrotoxic agents. Monitor strict in/out  Normocytic anemia Chronic severe anemia with hemoglobin between 7-8, now 7.0.   Likely due to chemotherapy. Oncology is recommended transfusion. S/p1 unit of packed RBC. Posttransfusion CBC 6.9, Will repeat H/H and plan to transfuse.  Current hemoglobin 7.0. Transfuse.  Ordered transfusion of 1 unit of packed red blood cell.  Hypomagnesemia- Improved with replacements.  Hypocalcemia- Calcium low, but corrected to low albumin.  AML (acute myeloblastic leukemia) (HCC) Patient has a history of recently diagnosed acute myeloid leukemia on Venetoclax and Decitabine. Oncology saw him yesterday in clinic. Hold chemo for now. Continue to follow up outpatient.  Chronic systolic CHF (congestive heart failure) (HCC) Patient has a history of HFrEF with last EF of 45-50%.  He appears euvolemic at this time. Hold home Lasix in the  setting of AKI Monitor Daily weights, strict in and out  Thrombocytopenia (HCC) Severe chronic thrombocytopenia in the setting of chemotherapy.  Stable at this time with no evidence of acute bleed on exam.  Type 2 diabetes mellitus with complication (HCC) Diet-controlled type 2 diabetes with last A1c of 5.0%.  Essential hypertension Continue home metoprolol     He is resident on SNF. Out of bed to chair. Incentive spirometry. Nursing supportive care. Fall, aspiration precautions. Diet:  Diet Orders (From admission, onward)     Start     Ordered   05/16/23 1308  Diet regular Room service appropriate? Yes; Fluid consistency: Thin  Diet effective now       Question Answer Comment  Room service appropriate? Yes   Fluid consistency: Thin      05/16/23 1307           DVT prophylaxis: SCDs Start: 05/16/23 1308  Level of care: med-surg   Code Status: Full Code  Subjective: Patient is seen and examined today morning.  Patient continues to feel weak.  Denies of any pain.  Creatinine mildly improved, hemoglobin 7.0.  Ordering transfusion of 1 unit of packed red blood cells today.   Physical Exam: Vitals:   05/17/23 1620 05/17/23 2031 05/18/23 0501 05/18/23 0752  BP: (!) 135/90 137/85 (!) 149/80 129/75  Pulse: 86 94 87 89  Resp: 18 15 19 20   Temp: 97.8 F (36.6 C) 97.6 F (36.4 C) 98.6 F (37 C) 98.3 F (36.8 C)  TempSrc: Oral  Oral   SpO2: 100% 100% 97% 98%  Weight:      Height:        General - Elderly weak  Caucasian male, no apparent distress HEENT - PERRLA, EOMI, atraumatic head, non tender sinuses. Lung - distant breath sounds, basal rales, no rhonchi, wheezes. Heart - S1, S2 heard, no murmurs, rubs, trace pedal edema. Abdomen - Soft, non tender, obese, bowel sounds good Neuro - Alert, awake and oriented x 3, non focal exam. Skin - Warm and dry.  Data Reviewed:      Latest Ref Rng & Units 05/18/2023    5:20 AM 05/17/2023    8:14 PM 05/17/2023    5:03 AM   CBC  WBC 4.0 - 10.5 K/uL 13.9   13.1   Hemoglobin 13.0 - 17.0 g/dL 7.0  7.3  6.7   Hematocrit 39.0 - 52.0 % 22.0  23.5  20.5   Platelets 150 - 400 K/uL 46   41       Latest Ref Rng & Units 05/18/2023    5:20 AM 05/17/2023    5:03 AM 05/16/2023    9:21 AM  BMP  Glucose 70 - 99 mg/dL 409  93  811   BUN 8 - 23 mg/dL 69  71  72   Creatinine 0.61 - 1.24 mg/dL 9.14  7.82  9.56   Sodium 135 - 145 mmol/L 135  138  139   Potassium 3.5 - 5.1 mmol/L 3.7  3.9  4.2   Chloride 98 - 111 mmol/L 104  106  104   CO2 22 - 32 mmol/L 20  20  20    Calcium 8.9 - 10.3 mg/dL 6.8  6.7  6.8    US RENAL Result Date: 05/16/2023 CLINICAL DATA:  Acute renal failure EXAM: RENAL / URINARY TRACT ULTRASOUND COMPLETE COMPARISON:  Renal CT 04/06/2023 FINDINGS: Right Kidney: Renal measurements: 13.0 x 6.9 x 7.4 cm = volume: 347 mL. Normal renal cortical thickness. Increased cortical echogenicity. No mass or hydronephrosis. Left Kidney: Renal measurements: 9.0 x 2.4 x 3.1 cm = volume: 36 mL. Renal cortical thinning. Increased cortical echogenicity. No mass or hydronephrosis. Bladder: Not well visualized. Other: None. IMPRESSION: 1. No hydronephrosis. 2. Increased cortical echogenicity as can be seen with chronic medical renal disease. 3. No definite mass identified within the right kidney to correspond with the mass described on recent CT 04/06/2023. Consider further evaluation with contrast-enhanced CT or MRI as clinically warranted. Electronically Signed   By: Annia Belt M.D.   On: 05/16/2023 14:29    Family Communication: Discussed with patient, he understand and agree. All questions answered.  Disposition: Status is: Observation The patient will require care spanning > 2 midnights and should be moved to inpatient because: AKI  Planned Discharge Destination: Skilled nursing facility     Time spent: 38 minutes  Author: Harold Hedge, MD 05/18/2023 12:54 PM Secure chat 7am to 7pm For on call review  [email or web address].
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
## 2023-05-18 NOTE — Evaluation (Signed)
 Occupational Therapy Evaluation Patient Details Name: Robert [name] MRN: [id number] DOB: 29-Apr-1945 Today's Date: 05/18/2023   History of Present Illness   Pt is a 78 y.o. 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 MD assessment includes: acute on chronic CKD, normocytic anemia, hypomagnesemia, hypocalcemia, and thrombocytopenia.     Clinical Impressions Patient presenting with decreased Ind in self care,balance, functional mobility/transfers, endurance, and safety awareness.  Patient reports recently being at [facility] for rehab and then transitioning to LTC. He has been transferring with slide board into wheelchair and staff assist with ADLs. He feeds himself. Pt is very motivated and wants to be able to be more independent in self care and mobility. Pt performing bed mobility with min A and lateral scoots with mod- max A.  Patient will benefit from acute OT to increase overall independence in the areas of ADLs, functional mobility, and safety awareness in order to safely discharge.     If plan is discharge home, recommend the following:   A lot of help with walking and/or transfers;A lot of help with bathing/dressing/bathroom;Assistance with cooking/housework;Assist for transportation;Help with stairs or ramp for entrance     Functional Status Assessment   Patient has had a recent decline in their functional status and demonstrates the ability to make significant improvements in function in a reasonable and predictable amount of time.     Equipment Recommendations   None recommended by OT      Precautions/Restrictions   Precautions Precautions: Fall Restrictions Weight  Bearing Restrictions Per Provider Order: No     Mobility Bed Mobility Overal bed mobility: Needs Assistance Bed Mobility: Supine to Sit, Sit to Supine     Supine to sit: Min assist Sit to supine: Min assist        Transfers Overall transfer level: Needs assistance                 General transfer comment: max A lateral scoot      Balance Overall balance assessment: Needs assistance, History of Falls   Sitting balance-[name]: Good                                     ADL either performed or assessed with clinical judgement   ADL Overall ADL's : Needs assistance/impaired     Grooming: Wash/dry hands;Wash/dry face;Sitting;Set up;Supervision/safety                                       Vision Patient Visual Report: No change from baseline              Pertinent Vitals/Pain Pain Assessment Pain Assessment: No/denies pain     Extremity/Trunk Assessment Upper Extremity Assessment Upper Extremity Assessment: Generalized weakness   Lower Extremity Assessment Lower Extremity Assessment: Generalized weakness       Communication Communication Communication: No apparent difficulties   Cognition Arousal: Alert Behavior During Therapy: WFL for tasks assessed/performed Cognition: No apparent impairments  Following commands: Intact       Cueing  General Comments   Cueing Techniques: Verbal cues;Visual cues;Tactile cues              Home Living Family/patient expects to be discharged to:: Skilled nursing facility                                 Additional Comments: At baseline, lives at home with wife in single-story home with ramp access but most recently has been in LTC at [facility] after stint in rehab there      Prior Functioning/Environment Prior Level of Function : History of Falls (last six months)             Mobility Comments: At  baseline pt is mod indep with [facility] for mobilization but recently has become weaker and has been using w/c for facility mobility and sliding boards for transfers ADLs Comments: Assist from staff for ADLs and slideboard transfer from bed <>wheelchair    OT Problem List: Decreased strength;Decreased activity tolerance;Decreased safety awareness;Impaired balance (sitting and/or standing);Decreased knowledge of use of DME or AE   OT Treatment/Interventions: Self-care/ADL training;Therapeutic exercise;Therapeutic activities;Energy conservation;DME and/or AE instruction;Patient/family education;Balance training      OT Goals(Current goals can be found in the care plan section)   Acute Rehab OT Goals Patient Stated Goal: to get stronger and do therapy OT Goal Formulation: With patient Time For Goal Achievement: 06/01/23 Potential to Achieve Goals: Fair ADL Goals Pt Will Perform Grooming: with modified independence;sitting Pt Will Perform Lower Body Dressing: with min assist;sitting/lateral leans Pt Will Transfer to Toilet: with min assist Pt Will Perform Toileting - Clothing Manipulation and hygiene: with min assist;sitting/lateral leans   OT Frequency:  Min 2X/week       AM-PAC OT "6 Clicks" Daily Activity     Outcome Measure Help from another person eating meals?: None Help from another person taking care of personal grooming?: None Help from another person toileting, which includes using toliet, bedpan, or urinal?: A Lot Help from another person bathing (including washing, rinsing, drying)?: A Lot Help from another person to put on and taking off regular upper body clothing?: A Little Help from another person to put on and taking off regular lower body clothing?: A Lot 6 Click Score: 17   End of Session Nurse Communication: Mobility status  Activity Tolerance: Patient tolerated treatment well Patient left: in bed;with call bell/phone within reach;with bed alarm set  OT Visit  Diagnosis: Unsteadiness on feet (R26.81);Muscle weakness (generalized) (M62.81)                Time: 6269-4854 OT Time Calculation (min): 19 min Charges:  OT General Charges $OT Visit: 1 Visit OT Evaluation $OT Eval Low Complexity: 1 Low OT Treatments $Therapeutic Activity: 8-22 mins  Jackquline Denmark, MS, OTR/L , CBIS ascom [phone]  05/18/23, 1:46 PM

## 2023-05-18 NOTE — Evaluation (Signed)
 Physical Therapy Evaluation Patient Details Name: Robert [name] MRN: [id number] DOB: [birth year] Today's Date: 05/18/2023  History of Present Illness  Pt is a 78 y.o. 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 MD assessment includes: acute on chronic CKD, normocytic anemia, hypomagnesemia, hypocalcemia, and thrombocytopenia.   Clinical Impression  Pt was pleasant and very motivated to participate during the session and put forth good effort throughout. Pt required significant time and effort with bed mobility tasks along with min A for BLE and trunk management.  Pt presented with good unsupported sitting balance at the EOB.  Bed positioned to allow pt to trunk flex an appropriate amount while holding the sink bowl/counter for support during sit to stand attempts.  Pt able to fully clear the EOB with mod A but unable to come to full upright position.  Max partial stand time around 5 sec during each attempt.  Pt will benefit from continued PT services upon discharge to safely address deficits listed in patient problem list for decreased caregiver assistance and eventual return to PLOF.          If plan is discharge home, recommend the following: Two people to help with walking and/or transfers;A lot of help with bathing/dressing/bathroom;Assistance with cooking/housework;Assist for transportation   Can travel by private vehicle   No    Equipment Recommendations Other (comment) (TBD at next venue of care)  Recommendations for Other Services       Functional Status Assessment Patient has had a recent decline in their functional status and/or demonstrates limited ability to make significant improvements  in function in a reasonable and predictable amount of time     Precautions / Restrictions Precautions Precautions: Fall Restrictions Weight Bearing Restrictions Per Provider Order: No      Mobility  Bed Mobility Overal bed mobility: Needs Assistance Bed Mobility: Supine to Sit, Sit to Supine     Supine to sit: Min assist Sit to supine: Min assist   General bed mobility comments: Min A for BLE and trunk control along with use of bed rails    Transfers Overall transfer level: Needs assistance   Transfers: Sit to/from Stand Sit to Stand: Mod assist, From elevated surface           General transfer comment: Pt able to clear the surface of the bed but unable to come to full upright standing with pt using sink bowl/counter to assist coming to standing    Ambulation/Gait               General Gait Details: Unable  Stairs            Wheelchair Mobility     Tilt Bed    Modified Rankin (Stroke Patients Only)       Balance Overall balance assessment: Needs assistance, History of Falls   Sitting balance-[name]: Good         Standing balance comment: Unable to come to full standing                             Pertinent Vitals/Pain Pain Assessment Pain Assessment: No/denies pain    Home Living Family/patient expects to be discharged to:: Skilled nursing  facility                   Additional Comments: At baseline, lives at home with wife in single-story home with ramp access but most recently has been in LTC at [facility] after stint in rehab there    Prior Function Prior Level of Function : History of Falls (last six months)             Mobility Comments: At baseline pt is mod indep with [facility] for mobilization but recently has become weaker and has been using w/c for facility mobility and sliding boards for transfers ADLs Comments: Assist from staff for ADLs     Extremity/Trunk Assessment   Upper Extremity  Assessment Upper Extremity Assessment: Generalized weakness    Lower Extremity Assessment Lower Extremity Assessment: Generalized weakness       Communication   Communication Communication: No apparent difficulties    Cognition Arousal: Alert Behavior During Therapy: WFL for tasks assessed/performed   PT - Cognitive impairments: No apparent impairments                         Following commands: Intact       Cueing Cueing Techniques: Verbal cues, Visual cues, Tactile cues     General Comments      Exercises Other Exercises Other Exercises: Partical sit to stands at EOB x 8   Assessment/Plan    PT Assessment Patient needs continued PT services  PT Problem List Decreased strength;Decreased activity tolerance;Decreased balance;Decreased mobility;Decreased knowledge of use of DME       PT Treatment Interventions DME instruction;Gait training;Functional mobility training;Therapeutic activities;Therapeutic exercise;Balance training;Patient/family education    PT Goals (Current goals can be found in the Care Plan section)  Acute Rehab PT Goals Patient Stated Goal: To walk again PT Goal Formulation: With patient Time For Goal Achievement: 05/31/23 Potential to Achieve Goals: Fair    Frequency Min 1X/week     Co-evaluation               AM-PAC PT "6 Clicks" Mobility  Outcome Measure Help needed turning from your back to your side while in a flat bed without using bedrails?: A Little Help needed moving from lying on your back to sitting on the side of a flat bed without using bedrails?: A Little Help needed moving to and from a bed to a chair (including a wheelchair)?: Total Help needed standing up from a chair using your arms (e.g., wheelchair or bedside chair)?: Total Help needed to walk in hospital room?: Total Help needed climbing 3-5 steps with a railing? : Total 6 Click Score: 10    End of Session Equipment Utilized During Treatment: Gait  belt Activity Tolerance: Patient tolerated treatment well Patient left: in bed;with call bell/phone within reach;with bed alarm set Nurse Communication: Mobility status PT Visit Diagnosis: Muscle weakness (generalized) (M62.81);Difficulty in walking, not elsewhere classified (R26.2);Unsteadiness on feet (R26.81)    Time: 0930-1007 PT Time Calculation (min) (ACUTE ONLY): 37 min   Charges:   PT Evaluation $PT Eval Moderate Complexity: 1 Mod PT Treatments $Therapeutic Activity: 8-22 mins PT General Charges $$ ACUTE PT VISIT: 1 Visit       D. Elly Modena PT, DPT 05/18/23, 12:34 PM

## 2023-05-19 DIAGNOSIS — N179 Acute kidney failure, unspecified: Secondary | ICD-10-CM | POA: Diagnosis not present

## 2023-05-19 LAB — RETICULOCYTES
Immature Retic Fract: 29.9 % — ABNORMAL HIGH (ref 2.3–15.9)
RBC.: 2.4 MIL/uL — ABNORMAL LOW (ref 4.22–5.81)
Retic Count, Absolute: 87.5 10*3/uL (ref 19.0–186.0)
Retic Ct Pct: 3.7 % — ABNORMAL HIGH (ref 0.4–3.1)

## 2023-05-19 LAB — COMPREHENSIVE METABOLIC PANEL WITH GFR
ALT: 9 U/L (ref 0–44)
AST: 14 U/L — ABNORMAL LOW (ref 15–41)
Albumin: <1.5 g/dL — ABNORMAL LOW (ref 3.5–5.0)
Alkaline Phosphatase: 69 U/L (ref 38–126)
Anion gap: 9 (ref 5–15)
BUN: 67 mg/dL — ABNORMAL HIGH (ref 8–23)
CO2: 20 mmol/L — ABNORMAL LOW (ref 22–32)
Calcium: 6.9 mg/dL — ABNORMAL LOW (ref 8.9–10.3)
Chloride: 105 mmol/L (ref 98–111)
Creatinine, Ser: 4.34 mg/dL — ABNORMAL HIGH (ref 0.61–1.24)
GFR, Estimated: 13 mL/min — ABNORMAL LOW (ref >60)
Glucose, Bld: 88 mg/dL (ref 70–99)
Potassium: 4 mmol/L (ref 3.5–5.1)
Sodium: 134 mmol/L — ABNORMAL LOW (ref 135–145)
Total Bilirubin: 0.7 mg/dL (ref 0.0–1.2)
Total Protein: 5.9 g/dL — ABNORMAL LOW (ref 6.5–8.1)

## 2023-05-19 LAB — CBC WITH DIFFERENTIAL/PLATELET
Abs Immature Granulocytes: 0.19 10*3/uL — ABNORMAL HIGH (ref 0.00–0.07)
Basophils Absolute: 0 10*3/uL (ref 0.0–0.1)
Basophils Relative: 0 %
Eosinophils Absolute: 0 10*3/uL (ref 0.0–0.5)
Eosinophils Relative: 0 %
HCT: 21.4 % — ABNORMAL LOW (ref 39.0–52.0)
Hemoglobin: 6.8 g/dL — ABNORMAL LOW (ref 13.0–17.0)
Immature Granulocytes: 2 %
Lymphocytes Relative: 28 %
Lymphs Abs: 3.3 10*3/uL (ref 0.7–4.0)
MCH: 28.2 pg (ref 26.0–34.0)
MCHC: 31.8 g/dL (ref 30.0–36.0)
MCV: 88.8 fL (ref 80.0–100.0)
Monocytes Absolute: 2.1 10*3/uL — ABNORMAL HIGH (ref 0.1–1.0)
Monocytes Relative: 18 %
Neutro Abs: 6.1 10*3/uL (ref 1.7–7.7)
Neutrophils Relative %: 52 %
Platelets: 58 10*3/uL — ABNORMAL LOW (ref 150–400)
RBC: 2.41 MIL/uL — ABNORMAL LOW (ref 4.22–5.81)
RDW: 17.2 % — ABNORMAL HIGH (ref 11.5–15.5)
Smear Review: DECREASED
WBC: 11.7 10*3/uL — ABNORMAL HIGH (ref 4.0–10.5)
nRBC: 0.4 % — ABNORMAL HIGH (ref 0.0–0.2)

## 2023-05-19 LAB — IRON AND TIBC: Iron: 42 ug/dL — ABNORMAL LOW (ref 45–182)

## 2023-05-19 LAB — FOLATE: Folate: 5.1 ng/mL — ABNORMAL LOW (ref >5.9)

## 2023-05-19 LAB — PREPARE RBC (CROSSMATCH)

## 2023-05-19 LAB — MAGNESIUM: Magnesium: 1.4 mg/dL — ABNORMAL LOW (ref 1.7–2.4)

## 2023-05-19 LAB — FERRITIN: Ferritin: 1442 ng/mL — ABNORMAL HIGH (ref 24–336)

## 2023-05-19 MED ORDER — FOLIC ACID 1 MG PO TABS
1.0000 mg | ORAL_TABLET | Freq: Every day | ORAL | Status: DC
  Administered 2023-05-19 – 2023-05-25 (×7): 1 mg via ORAL
  Filled 2023-05-19 (×7): qty 1

## 2023-05-19 MED ORDER — SODIUM CHLORIDE 0.9% IV SOLUTION: Freq: Once | INTRAVENOUS | Status: AC

## 2023-05-19 NOTE — Plan of Care (Signed)
  Problem: Health Behavior/Discharge Planning: Goal: Ability to manage health-related needs will improve Outcome: Progressing   Problem: Clinical Measurements: Goal: Cardiovascular complication will be avoided Outcome: Progressing   Problem: Elimination: Goal: Will not experience complications related to urinary retention Outcome: Progressing

## 2023-05-19 NOTE — TOC Progression Note (Signed)
 Transition of Care [facility]) - Progression Note    Patient Details  Name: Robert [name] MRN: [id number] Date of Birth: 03-15-1945  [facility] CM/SW Contact  Liliana Cline, LCSW Phone Number: 05/19/2023, 12:24 PM  Clinical Narrative:    Per notes, patient is from [facility]. Therapy recommends STR. CSW attempted call to Tobi Bastos in Admissions to inquire if patient can get STR when he returns. Left a VM requesting a return call. Patient will need FL2 and insurance authorization depending on Anna's response.   Expected Discharge Plan: Long Term Nursing Home Barriers to Discharge: No Barriers Identified  Expected Discharge Plan and Services     Post Acute Care Choice: Long Term Acute Care (LTAC)                                         Social Determinants of Health (SDOH) Interventions SDOH Screenings   Food Insecurity: No Food Insecurity (05/16/2023)  Housing: Low Risk  (05/16/2023)  Transportation Needs: No Transportation Needs (05/16/2023)  Utilities: Not At Risk (05/16/2023)  Alcohol Screen: Low Risk  (10/29/2020)  Depression (PHQ2-9): Low Risk  (09/12/2021)  Financial Resource Strain: Low Risk  (12/15/2022)   Received from [facility]  Physical Activity: Inactive (10/29/2020)  Social Connections: Patient Declined (05/16/2023)  Stress: No Stress Concern Present (10/29/2020)  Tobacco Use: Low Risk  (05/16/2023)    Readmission Risk Interventions    04/04/2023   12:56 PM 02/07/2023   12:33 PM  Readmission Risk Prevention Plan  Transportation Screening Complete Complete  Medication Review (RN Care Manager) Complete Complete  PCP or Specialist appointment within 3-5 days of discharge Complete Complete  SW Recovery Care/Counseling Consult Complete Complete  Palliative Care Screening Not Applicable Not Applicable  Skilled Nursing Facility Complete Complete

## 2023-05-19 NOTE — Progress Notes (Signed)
 Progress Note   Patient: Robert [name]:[id number] DOB: [birth year] DOA: 05/16/2023     2 DOS: the patient was seen and examined on 05/19/2023   Brief hospital course: AUTHER LYERLY is a 78 y.o. 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   The patient's hemoglobin upon admission was 7.0 for which he received transfusion with 1 unit PRBC's. The following day on 05/17/2023 the patient's hemoglobin was 6.7. He received a second unit of PRBC's. This briefly brought up the patient's hemoglobin to 7.3 on the evening of 05/17/2023. However, the following morning it was again 7.0 on 05/18/2023. On the morning of 05/19/2023 the patient's hemoglobin was again low at 6.8. He is receiving a third unit of PRBC's today. Will check anemia panel.  Assessment and Plan: * Acute on CKD stage 3B: Baseline creatinine ranging between around 2.25 and is now 4.34.  CT renal stone study in February demonstrated severely atrophic left kidney. Renal ultrasound medical renal disease. CK - 56. Urinalysis abnormal, no complaints.  Monitor daily renal function. Mild improvement in creatinine. Hold all nephrotoxic agents. Monitor creatinine, electrolytes, and volume status carefully.  Normocytic anemia Chronic severe anemia with hemoglobin between 7-8, now 6.8. The patient's hemoglobin upon admission was 7.0 for which he received transfusion with 1 unit PRBC's. The following day on 05/17/2023 the patient's hemoglobin was 6.7. He received a second unit of PRBC's. This briefly brought up the patient's hemoglobin to 7.3 on the evening of 05/17/2023. However, the following morning it was again 7.0 on 05/18/2023. On the morning of 05/19/2023  the patient's hemoglobin was again low at 6.8. He is receiving a third unit of PRBC's today. The source of the patient's anemia is thought to be multifactorial. 1) chemotherapy 2) Anemia of chronic disease 3) anemia due to chronic renal disease. Will check anemia panel to investigate iron deficiency. Vit B 12 is elevated, but folate is low. Will supplement folate.  Hypomagnesemia- Improved with supplementation.  Hypocalcemia- Calcium low, but corrected to 8.9 due to low albumin.  AML (acute myeloblastic leukemia) (HCC) Patient has a history of recently diagnosed acute myeloid leukemia on Venetoclax and Decitabine. Oncology saw him yesterday in clinic. Hold chemo for now. Continue to follow up outpatient.  Chronic systolic CHF (congestive heart failure) (HCC) Patient has a history of HFrEF with last EF of 45-50%.  He appears euvolemic at this time. Hold home Lasix in the setting of AKI. Monitor Daily weights, volume status. Appears euvolemic at this time.   Thrombocytopenia (HCC) Severe chronic thrombocytopenia in the setting of chemotherapy.  Stable at this time with no evidence of acute bleed on exam.  Type 2 diabetes mellitus with complication (HCC) Diet-controlled type 2 diabetes with last A1c of 5.0%.  Essential hypertension The patient is normotensive on Metoprolol succinate 50 mg daily. Continue to monitor.     He is resident on SNF. Out of bed to chair. Incentive spirometry. Nursing supportive care. Fall, aspiration precautions. Diet:  Diet Orders (From admission, onward)     Start     Ordered   05/16/23 1308  Diet regular Room service appropriate? Yes; Fluid consistency: Thin  Diet effective now       Question Answer Comment  Room service  appropriate? Yes   Fluid consistency: Thin      05/16/23 1307           DVT prophylaxis: SCDs Start: 05/16/23 1308  Level of care: med-surg   Code Status: Full Code  Subjective: Patient is seen and examined today morning.   Patient continues to feel weak.  Denies of any pain.  Creatinine mildly improved, hemoglobin 6.8.  Ordering transfusion of 1 unit of packed red blood cells today.   Physical Exam: Vitals:   05/19/23 0500 05/19/23 0837 05/19/23 1218 05/19/23 1236  BP:  (!) 111/59 (!) 142/85 132/77  Pulse:  90 87 87  Resp:  20 17 18   Temp:  97.8 F (36.6 C) 97.8 F (36.6 C) 97.9 F (36.6 C)  TempSrc:  Oral Oral Oral  SpO2:  96% 100% 100%  Weight: 100 kg     Height:        Exam:  Constitutional:  The patient is awake, alert, and oriented x 3. No acute distress. Respiratory:  No increased work of breathing. No wheezes, rales, or rhonchi No tactile fremitus Cardiovascular:  Regular rate and rhythm No murmurs, ectopy, or gallups. No lateral PMI. No thrills. Abdomen:  Abdomen is soft, non-tender, non-distended No hernias, masses, or organomegaly Normoactive bowel sounds.  Musculoskeletal:  No cyanosis, clubbing, or edema Skin:  No rashes, lesions, ulcers palpation of skin: no induration or nodules Neurologic:  CN 2-12 intact Sensation all 4 extremities intact Psychiatric:  Mental status Mood, affect appropriate Orientation to person, place, time  judgment and insight appear intact  Data Reviewed:      Latest Ref Rng & Units 05/19/2023    4:09 AM 05/18/2023    5:20 AM 05/17/2023    8:14 PM  CBC  WBC 4.0 - 10.5 K/uL 11.7  13.9    Hemoglobin 13.0 - 17.0 g/dL 6.8  7.0  7.3   Hematocrit 39.0 - 52.0 % 21.4  22.0  23.5   Platelets 150 - 400 K/uL 58  46        Latest Ref Rng & Units 05/19/2023    4:09 AM 05/18/2023    5:20 AM 05/17/2023    5:03 AM  BMP  Glucose 70 - 99 mg/dL 88  409  93   BUN 8 - 23 mg/dL 67  69  71   Creatinine 0.61 - 1.24 mg/dL 8.11  9.14  7.82   Sodium 135 - 145 mmol/L 134  135  138   Potassium 3.5 - 5.1 mmol/L 4.0  3.7  3.9   Chloride 98 - 111 mmol/L 105  104  106   CO2 22 - 32 mmol/L 20  20  20    Calcium 8.9 - 10.3 mg/dL 6.9  6.8  6.7    No results  found.   Family Communication: None available.  Disposition: Status is: Observation The patient will require care spanning > 2 midnights and should be moved to inpatient because: AKI  Planned Discharge Destination: Skilled nursing facility     Time spent: 32 minutes  Author: Breckin Zafar, DO 05/19/2023 2:32 PM Secure chat 7am to 7pm For on call review [email or web address].
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
## 2023-05-20 DIAGNOSIS — N179 Acute kidney failure, unspecified: Secondary | ICD-10-CM | POA: Diagnosis not present

## 2023-05-20 LAB — TYPE AND SCREEN
ABO/RH(D): AB POS
Antibody Screen: NEGATIVE
Unit division: 0
Unit division: 0
Unit division: 0

## 2023-05-20 LAB — BPAM RBC
Blood Product Expiration Date: 202504012359
Blood Product Expiration Date: 202504062359
Blood Product Expiration Date: 202504252359
ISSUE DATE / TIME: 202503261452
ISSUE DATE / TIME: 202503281805
ISSUE DATE / TIME: 202503291200
Unit Type and Rh: 6200
Unit Type and Rh: 9500
Unit Type and Rh: 9500

## 2023-05-20 LAB — BASIC METABOLIC PANEL WITH GFR
Anion gap: 10 (ref 5–15)
BUN: 67 mg/dL — ABNORMAL HIGH (ref 8–23)
CO2: 20 mmol/L — ABNORMAL LOW (ref 22–32)
Calcium: 7.2 mg/dL — ABNORMAL LOW (ref 8.9–10.3)
Chloride: 105 mmol/L (ref 98–111)
Creatinine, Ser: 4.34 mg/dL — ABNORMAL HIGH (ref 0.61–1.24)
GFR, Estimated: 13 mL/min — ABNORMAL LOW (ref >60)
Glucose, Bld: 133 mg/dL — ABNORMAL HIGH (ref 70–99)
Potassium: 4.1 mmol/L (ref 3.5–5.1)
Sodium: 135 mmol/L (ref 135–145)

## 2023-05-20 LAB — CBC
HCT: 24.7 % — ABNORMAL LOW (ref 39.0–52.0)
Hemoglobin: 8.2 g/dL — ABNORMAL LOW (ref 13.0–17.0)
MCH: 28.6 pg (ref 26.0–34.0)
MCHC: 33.2 g/dL (ref 30.0–36.0)
MCV: 86.1 fL (ref 80.0–100.0)
Platelets: 70 10*3/uL — ABNORMAL LOW (ref 150–400)
RBC: 2.87 MIL/uL — ABNORMAL LOW (ref 4.22–5.81)
RDW: 17.4 % — ABNORMAL HIGH (ref 11.5–15.5)
WBC: 11.7 10*3/uL — ABNORMAL HIGH (ref 4.0–10.5)
nRBC: 0.3 % — ABNORMAL HIGH (ref 0.0–0.2)

## 2023-05-20 LAB — VITAMIN B12: Vitamin B-12: 1118 pg/mL — ABNORMAL HIGH (ref 180–914)

## 2023-05-20 NOTE — TOC Progression Note (Signed)
 Transition of Care [facility]) - Progression Note    Patient Details  Name: [name] MRN: [id number] Date of Birth: 12/16/1945  Transition of [facility]) CM/SW Contact  Liliana Cline, LCSW Phone Number: 05/20/2023, 2:28 PM  Clinical Narrative:    Awaiting response from Tobi Bastos at [employer]. Unable to pull patient up in [employer] to start auth.   Expected Discharge Plan: Long Term Nursing Home Barriers to Discharge: No Barriers Identified  Expected Discharge Plan and Services     Post Acute Care Choice: Long Term Acute Care (LTAC)                                         Social Determinants of Health (SDOH) Interventions SDOH Screenings   Food Insecurity: No Food Insecurity (05/16/2023)  Housing: Low Risk  (05/16/2023)  Transportation Needs: No Transportation Needs (05/16/2023)  Utilities: Not At Risk (05/16/2023)  Alcohol Screen: Low Risk  (10/29/2020)  Depression (PHQ2-9): Low Risk  (09/12/2021)  Financial Resource Strain: Low Risk  (12/15/2022)   Received from [facility]  Physical Activity: Inactive (10/29/2020)  Social Connections: Patient Declined (05/16/2023)  Stress: No Stress Concern Present (10/29/2020)  Tobacco Use: Low Risk  (05/16/2023)    Readmission Risk Interventions    04/04/2023   12:56 PM 02/07/2023   12:33 PM  Readmission Risk Prevention Plan  Transportation Screening Complete Complete  Medication Review (RN Care Manager) Complete Complete  PCP or Specialist appointment within 3-5 days of discharge Complete Complete  SW Recovery Care/Counseling Consult Complete Complete  Palliative Care Screening Not Applicable Not Applicable  Skilled Nursing Facility Complete Complete

## 2023-05-20 NOTE — Plan of Care (Signed)

## 2023-05-20 NOTE — Progress Notes (Signed)
 Progress Note   Patient: Robert [name] KGM:[id number] DOB: [birth year] DOA: 05/16/2023     3 DOS: the patient was seen and examined on 05/20/2023   Brief hospital course: Robert [name] is a 78 y.o. 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   The patient's hemoglobin upon admission was 7.0 for which he received transfusion with 1 unit PRBC's. The following day on 05/17/2023 the patient's hemoglobin was 6.7. He received a second unit of PRBC's. This briefly brought up the patient's hemoglobin to 7.3 on the evening of 05/17/2023. However, the following morning it was again 7.0 on 05/18/2023. On the morning of 05/19/2023 the patient's hemoglobin was again low at 6.8. He is receiving a third unit of PRBC's today. Anemia panel demonstrates Iron level is low at 42, Ferritin elevated at 1442, Folate low at 5.1.  Assessment and Plan: * Acute on CKD stage 3B: Baseline creatinine ranging between around 2.25 and is now 4.34.  CT renal stone study in February demonstrated severely atrophic left kidney. Renal ultrasound medical renal disease. CK - 56. Urinalysis abnormal, no complaints.  Monitor daily renal function. Mild improvement in creatinine. Hold all nephrotoxic agents. Monitor creatinine, electrolytes, and volume status carefully.  Anemia of Chronic disease and iron deficiency Chronic severe anemia with hemoglobin between 7-8, now 6.8. The patient's hemoglobin upon admission was 7.0 for which he received transfusion with 1 unit PRBC's. The following day on 05/17/2023 the patient's hemoglobin was 6.7. He received a second unit of PRBC's. This briefly brought up the patient's hemoglobin to 7.3 on the evening of  05/17/2023. However, the following morning it was again 7.0 on 05/18/2023. On the morning of 05/19/2023 the patient's hemoglobin was again low at 6.8. He is receiving a third unit of PRBC's today. The source of the patient's anemia is thought to be multifactorial. 1) chemotherapy 2) Anemia of chronic disease 3) anemia due to chronic renal disease. Will check anemia panel to investigate iron deficiency. Vit B 12 is elevated, but folate is low. Will supplement folate.  Hypomagnesemia- Improved with supplementation.  Hypocalcemia- Calcium low, but corrected to 8.9 due to low albumin.  AML (acute myeloblastic leukemia) (HCC) Patient has a history of recently diagnosed acute myeloid leukemia on Venetoclax and Decitabine. Oncology saw him yesterday in clinic. Hold chemo for now. Continue to follow up outpatient.  Chronic systolic CHF (congestive heart failure) (HCC) Patient has a history of HFrEF with last EF of 45-50%.  He appears euvolemic at this time. Hold home Lasix in the setting of AKI. Monitor Daily weights, volume status. Appears euvolemic at this time.   Thrombocytopenia (HCC) Severe chronic thrombocytopenia in the setting of chemotherapy.  Stable at this time with no evidence of acute bleed on exam.  Type 2 diabetes mellitus with complication (HCC) Diet-controlled type 2 diabetes with last A1c of 5.0%.  Essential hypertension The patient is normotensive on Metoprolol succinate 50 mg daily. Continue to monitor.     He is resident on SNF. Out of bed to chair. Incentive spirometry. Nursing supportive care. Fall, aspiration precautions. Diet:  Diet Orders (From admission, onward)     Start     Ordered   05/16/23 1308  Diet regular Room service appropriate? Yes; Fluid  consistency: Thin  Diet effective now       Question Answer Comment  Room service appropriate? Yes   Fluid consistency: Thin      05/16/23 1307           DVT prophylaxis: SCDs Start: 05/16/23 1308  Level of  care: med-surg   Code Status: Full Code  Subjective: Patient is seen and examined today morning.  Patient continues to feel weak.  Denies of any pain.  Creatinine mildly improved, hemoglobin 8.2 on the morning of 05/20/2023.   Physical Exam: Vitals:   05/19/23 1442 05/19/23 1658 05/19/23 1948 05/20/23 0905  BP: (!) 140/78 132/83 128/79 (!) 142/90  Pulse: 86 92 91 94  Resp: 18 20 18 20   Temp: 97.8 F (36.6 C) 97.8 F (36.6 C) 98.1 F (36.7 C) 97.7 F (36.5 C)  TempSrc: Oral Oral    SpO2: 96% 99% 100% 100%  Weight:      Height:        Exam:  Constitutional:  The patient is awake, alert, and oriented x 3. No acute distress. Respiratory:  No increased work of breathing. No wheezes, rales, or rhonchi No tactile fremitus Cardiovascular:  Regular rate and rhythm No murmurs, ectopy, or gallups. No lateral PMI. No thrills. Abdomen:  Abdomen is soft, non-tender, non-distended No hernias, masses, or organomegaly Normoactive bowel sounds.  Musculoskeletal:  No cyanosis, clubbing, or edema Skin:  No rashes, lesions, ulcers palpation of skin: no induration or nodules Neurologic:  CN 2-12 intact Sensation all 4 extremities intact Psychiatric:  Mental status Mood, affect appropriate Orientation to person, place, time  judgment and insight appear intact  Data Reviewed:      Latest Ref Rng & Units 05/20/2023    3:56 AM 05/19/2023    4:09 AM 05/18/2023    5:20 AM  CBC  WBC 4.0 - 10.5 K/uL 11.7  11.7  13.9   Hemoglobin 13.0 - 17.0 g/dL 8.2  6.8  7.0   Hematocrit 39.0 - 52.0 % 24.7  21.4  22.0   Platelets 150 - 400 K/uL 70  58  46       Latest Ref Rng & Units 05/20/2023    3:56 AM 05/19/2023    4:09 AM 05/18/2023    5:20 AM  BMP  Glucose 70 - 99 mg/dL 161  88  096   BUN 8 - 23 mg/dL 67  67  69   Creatinine 0.61 - 1.24 mg/dL 0.45  4.09  8.11   Sodium 135 - 145 mmol/L 135  134  135   Potassium 3.5 - 5.1 mmol/L 4.1  4.0  3.7   Chloride 98 - 111 mmol/L 105  105  104   CO2  22 - 32 mmol/L 20  20  20    Calcium 8.9 - 10.3 mg/dL 7.2  6.9  6.8    No results found.   Family Communication: None available.  Disposition: Status is: Observation The patient will require care spanning > 2 midnights and should be moved to inpatient because: AKI  Planned Discharge Destination: Skilled nursing facility     Time spent: 32 minutes  Author: Burnie Hank, DO 05/20/2023 12:57 PM Secure chat 7am to 7pm For on call review [email or web address].

## 2023-05-21 ENCOUNTER — Ambulatory Visit: Payer: Medicare Other | Admitting: Urology

## 2023-05-21 DIAGNOSIS — N179 Acute kidney failure, unspecified: Secondary | ICD-10-CM | POA: Diagnosis not present

## 2023-05-21 LAB — MAGNESIUM: Magnesium: 1.5 mg/dL — ABNORMAL LOW (ref 1.7–2.4)

## 2023-05-21 LAB — CBC
HCT: 24.2 % — ABNORMAL LOW (ref 39.0–52.0)
Hemoglobin: 7.8 g/dL — ABNORMAL LOW (ref 13.0–17.0)
MCH: 28.5 pg (ref 26.0–34.0)
MCHC: 32.2 g/dL (ref 30.0–36.0)
MCV: 88.3 fL (ref 80.0–100.0)
Platelets: 83 10*3/uL — ABNORMAL LOW (ref 150–400)
RBC: 2.74 MIL/uL — ABNORMAL LOW (ref 4.22–5.81)
RDW: 17.3 % — ABNORMAL HIGH (ref 11.5–15.5)
WBC: 9.4 10*3/uL (ref 4.0–10.5)
nRBC: 0.4 % — ABNORMAL HIGH (ref 0.0–0.2)

## 2023-05-21 NOTE — Progress Notes (Signed)
 Occupational Therapy Treatment Patient Details Name: Robert [name] MRN: [id number] DOB: May 24, 1945 Today's Date: 05/21/2023   History of present illness Pt is a 78 y.o. 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 MD assessment includes: acute on chronic CKD, normocytic anemia, hypomagnesemia, hypocalcemia, and thrombocytopenia.   OT comments  Pt seen for OT tx. Pt in bed, reporting have a "bad day." Pt shared that he didn't get good news about his kidneys by the MD today and feeling down. Active listening and emotional support provided. Pt appreciative of opportunity to complete grooming tasks. Pt set up for grooming and endorsed feeling "much better" afterwards referring to physically and mentally. While completing, pt educated in ECS to support ADL and therapy efforts both here and at facility. Pt verbalized understanding. Will continue to progress as able.       If plan is discharge home, recommend the following:  A lot of help with walking and/or transfers;A lot of help with bathing/dressing/bathroom;Assistance with cooking/housework;Assist for transportation;Help with stairs or ramp for entrance   Equipment Recommendations  None recommended by OT    Recommendations for Other Services      Precautions / Restrictions Precautions Precautions: Fall Restrictions Weight Bearing Restrictions Per Provider Order: No       Mobility Bed Mobility                    Transfers                         Balance                                           ADL either performed or assessed with clinical judgement   ADL Overall ADL's : Needs assistance/impaired      Grooming: Wash/dry hands;Wash/dry face;Oral care;Bed level;Set up                                      Extremity/Trunk Assessment              Vision       Perception     Praxis     Communication Communication Communication: No apparent difficulties   Cognition Arousal: Alert Behavior During Therapy: WFL for tasks assessed/performed Cognition: No apparent impairments             OT - Cognition Comments: pleasant                 Following commands: Intact        Cueing      Exercises      Shoulder Instructions       General Comments      Pertinent Vitals/ Pain          Home Living                                          Prior Functioning/Environment  Frequency  Min 2X/week        Progress Toward Goals  OT Goals(current goals can now be found in the care plan section)  Progress towards OT goals: OT to reassess next treatment  Acute Rehab OT Goals Patient Stated Goal: get stronger and do therapy OT Goal Formulation: With patient Time For Goal Achievement: 06/01/23 Potential to Achieve Goals: Fair  Plan      Co-evaluation                 AM-PAC OT "6 Clicks" Daily Activity     Outcome Measure   Help from another person eating meals?: None Help from another person taking care of personal grooming?: None Help from another person toileting, which includes using toliet, bedpan, or urinal?: A Lot Help from another person bathing (including washing, rinsing, drying)?: A Lot Help from another person to put on and taking off regular upper body clothing?: A Little Help from another person to put on and taking off regular lower body clothing?: A Lot 6 Click Score: 17    End of Session    OT Visit Diagnosis: Unsteadiness on feet (R26.81);Muscle weakness (generalized) (M62.81)   Activity Tolerance Patient tolerated treatment well   Patient Left in bed;with call bell/phone  within reach;with bed alarm set   Nurse Communication          Time: 6644-0347 OT Time Calculation (min): 10 min  Charges: OT General Charges $OT Visit: 1 Visit OT Treatments $Therapeutic Activity: 8-22 mins  Arman Filter., MPH, MS, OTR/L ascom [phone] 05/21/23, 4:24 PM

## 2023-05-21 NOTE — Consult Note (Signed)
 [facility] Consult Note:05/21/23     Date of Admission:  05/16/2023           Reason for Consult:  AKI   Referring Provider: Fran Lowes, DO Primary Care Provider: Corky Downs, MD   History of Presenting Illness:  Robert [name] is a 78 y.o. male with medical problems of acute myeloid leukemia, on chemotherapy, history of sigmoid colon cancer status post partial colectomy in November 2024, chronic kidney disease, anemia, diabetes, peripheral arterial disease, chronic heart failure with reduced ejection fraction with a EF of 40%, history of pericardial effusion status post pericardial window.  He was sent over from oncology clinic for abnormal labs.  He was having diarrhea prior to admission.  He has had decreased oral intake. He was found to have a creatinine of 4.6, GFR 12, hemoglobin 7, platelets 41. Admitted for further evaluation and management.   Review of Systems: Review of Systems  Constitutional:  Positive for malaise/fatigue. Negative for chills, fever and weight loss.  HENT:  Negative for hearing loss.   Eyes:  Negative for blurred vision.  Respiratory:  Negative for cough, hemoptysis and shortness of breath.   Cardiovascular:  Negative for chest pain, palpitations and leg swelling.  Gastrointestinal:  Positive for diarrhea. Negative for abdominal pain, heartburn, nausea and vomiting.  Genitourinary:  Negative for dysuria, frequency and urgency.  Musculoskeletal:  Negative for myalgias.  Skin:  Positive for rash.  Neurological:  Positive for weakness. Negative for dizziness.  Endo/Heme/Allergies:  Bruises/bleeds easily.    Past Medical History:  Diagnosis Date   Allergy    Cellulitis    Diabetes mellitus without complication (HCC)    Hyperlipidemia    Hypertension    Leukemia (HCC)    Peripheral vascular disease (HCC)     Social History   Tobacco Use   Smoking status: Never   Smokeless tobacco: Never  Vaping Use   Vaping  status: Never Used  Substance Use Topics   Alcohol use: Not Currently    Comment: Occasionally   Drug use: No    Family History  Problem Relation Age of Onset   Heart disease Mother    Stroke Mother    Heart disease Father    Heart attack Father      OBJECTIVE: Blood pressure 127/81, pulse 81, temperature 98 F (36.7 C), resp. rate 16, height 6\' 2"  (1.88 m), weight 100 kg, SpO2 100%.  Physical Exam General appearance-no acute distress, laying in the bed HEENT-dry oral mucous membranes Pulmonary-normal breathing effort Cardiac-no rub Abdomen-soft, nontender, nondistended Extremities-trace edema Skin-scattered ecchymosis over the arms bilaterally Neuro-able to answer questions appropriately  Lab Results Lab Results  Component Value Date   WBC 9.4 05/21/2023   HGB 7.8 (L) 05/21/2023   HCT 24.2 (L) 05/21/2023   MCV 88.3 05/21/2023   PLT 83 (L) 05/21/2023    Lab Results  Component Value Date   CREATININE 4.34 (H) 05/20/2023   BUN 67 (H) 05/20/2023   NA 135 05/20/2023   K 4.1 05/20/2023   CL 105 05/20/2023   CO2 20 (L) 05/20/2023    Lab Results  Component Value Date   ALT 9 05/19/2023   AST 14 (L) 05/19/2023   ALKPHOS 69 05/19/2023   BILITOT 0.7 05/19/2023     Microbiology: Recent Results (from the past 240 hours)  MRSA Next Gen by PCR, Nasal     Status: None   Collection Time: 05/16/23  9:55 PM   Specimen: Nasal  Mucosa; Nasal Swab  Result Value Ref Range Status   MRSA by PCR Next Gen NOT DETECTED NOT DETECTED Final    Comment: (NOTE) The GeneXpert MRSA Assay (FDA approved for NASAL specimens only), is one component of a comprehensive MRSA colonization surveillance program. It is not intended to diagnose MRSA infection nor to guide or monitor treatment for MRSA infections. Test performance is not FDA approved in patients less than 34 years old. Performed at [facility], [address] [place], [place] [address]      Medications: Scheduled Meds:  DULoxetine  60 mg Oral BID   ferrous sulfate  325 mg Oral Q breakfast   folic acid  1 mg Oral Daily   latanoprost  1 drop Both Eyes QHS   melatonin  5 mg Oral QHS   metoprolol succinate  50 mg Oral Daily   mirtazapine  15 mg Oral QHS   oxybutynin  10 mg Oral QPM   pantoprazole  40 mg Oral Daily   simvastatin  20 mg Oral Daily   sodium chloride flush  3 mL Intravenous Q12H   Continuous Infusions:  sodium chloride Stopped (05/16/23 1215)   PRN Meds:.acetaminophen **OR** acetaminophen, loperamide, ondansetron **OR** ondansetron (ZOFRAN) IV  No Known Allergies  Urinalysis: No results for input(s): "COLORURINE", "LABSPEC", "PHURINE", "GLUCOSEU", "HGBUR", "BILIRUBINUR", "KETONESUR", "PROTEINUR", "UROBILINOGEN", "NITRITE", "LEUKOCYTESUR" in the last 72 hours.  Invalid input(s): "APPERANCEUR"    Imaging: No results found.    Assessment/Plan:  Robert [name] is a 78 y.o. male with medical problems of acute myeloid leukoma, underlying chemotherapy, history of sigmoid colon cancer status post partial colectomy in November 2024, chronic kidney disease, anemia, diabetes, peripheral arterial disease, chronic heart failure with reduced ejection fraction with a EF of 40%, history of pericardial effusion status post pericardial window. was admitted on 05/16/2023 for :  AKI (acute kidney injury) (HCC) [N17.9] Symptomatic anemia [D64.9]  Acute kidney injury on chronic kidney disease stage IV Proteinuria.   Pyuria Baseline creatinine of 2.25/GFR 29 from 05/09/2023. Previous CT in February 2025 showed severely atrophic left kidney. Urinalysis from 05/16/2023 shows moderate hemoglobin, large leukocytes, proteinuria, 11-20 RBCs and greater than 50 WBCs. Differential includes ATN versus acute interstitial nephritis from chemo versus various antibiotics. The rate of rise of creatinine has slowed.  Will monitor for now. No acute indication for dialysis at  present. Avoid hypotension Avoid nephrotoxic agents such as IV contrast   Bard Haupert 05/21/23

## 2023-05-21 NOTE — Progress Notes (Signed)
 Per Dr Gerri Lins, dc GI panel order and enteric precautions

## 2023-05-21 NOTE — Progress Notes (Signed)
 Physical Therapy Treatment Patient Details Name: Robert [name] MRN: [id number] DOB: 1945/09/01 Today's Date: 05/21/2023   History of Present Illness Pt is a 78 y.o. 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 MD assessment includes: acute on chronic CKD, normocytic anemia, hypomagnesemia, hypocalcemia, and thrombocytopenia.    PT Comments  Pt was pleasant, very motivated to participate during the session, and put forth excellent effort throughout. Pt required min A for BLE and trunk control during sup to/from sit along with use of the bed rails.  Pt presented with good static and dynamic sitting balance at the EOB without UE support.  Pt performed five max effort partial sit to stands from the EOB using the sink bowl to pull from and to encourage increased trunk flexion but was unable to fully clear the EOB this session.  Corene Cornea utilized for a second bout of five max effort sit to stands with pt able to clear the surface of the bed but unable to come to full upright position.  Pt was able to maintain partial standing position for a max of around 6-8 sec before fatiguing and needing to return to sitting with frequent therapeutic rest breaks provided between reps.  Pt will benefit from continued PT services upon discharge to safely address deficits listed in patient problem list for decreased caregiver assistance and eventual return to PLOF.      If plan is discharge home, recommend the following: Two people to help with walking and/or transfers;A lot of help with bathing/dressing/bathroom;Assistance with cooking/housework;Assist for transportation   Can travel by private vehicle     No  Equipment  Recommendations       Recommendations for Other Services       Precautions / Restrictions Precautions Precautions: Fall Restrictions Weight Bearing Restrictions Per Provider Order: No     Mobility  Bed Mobility Overal bed mobility: Needs Assistance Bed Mobility: Supine to Sit, Sit to Supine     Supine to sit: Min assist Sit to supine: Min assist   General bed mobility comments: Min A for BLE and trunk control along with use of bed rails    Transfers Overall transfer level: Needs assistance   Transfers: Sit to/from Stand Sit to Stand: Mod assist, From elevated surface, Max assist           General transfer comment: Multiple sit to/from stand attempts from EOB using sink bowl/counter to assist and multple attemps with Corene Cornea lift Transfer via Lift Equipment: Antony Salmon  Ambulation/Gait               General Gait Details: Unable   Stairs             Wheelchair Mobility     Tilt Bed    Modified Rankin (Stroke Patients Only)       Balance Overall balance assessment: Needs assistance, History of Falls   Sitting balance-[name]: Good         Standing balance comment: Unable to come to full standing                            Communication Communication Communication: No apparent difficulties  Cognition Arousal: Alert Behavior During Therapy:  WFL for tasks assessed/performed   PT - Cognitive impairments: No apparent impairments                         Following commands: Intact      Cueing Cueing Techniques: Verbal cues, Visual cues, Tactile cues  Exercises Other Exercises Other Exercises: Partial sit to/from stands x 10 with frequent therapeutic rest breaks between attempts    General Comments        Pertinent Vitals/Pain Pain Assessment Pain Assessment: No/denies pain    Home Living                          Prior Function            PT Goals (current goals can now be found in the  care plan section) Progress towards PT goals: Progressing toward goals    Frequency    Min 1X/week      PT Plan      Co-evaluation              AM-PAC PT "6 Clicks" Mobility   Outcome Measure  Help needed turning from your back to your side while in a flat bed without using bedrails?: A Little Help needed moving from lying on your back to sitting on the side of a flat bed without using bedrails?: A Lot Help needed moving to and from a bed to a chair (including a wheelchair)?: Total Help needed standing up from a chair using your arms (e.g., wheelchair or bedside chair)?: Total Help needed to walk in hospital room?: Total Help needed climbing 3-5 steps with a railing? : Total 6 Click Score: 9    End of Session Equipment Utilized During Treatment: Gait belt Activity Tolerance: Patient tolerated treatment well Patient left: in bed;with call bell/phone within reach;with bed alarm set Nurse Communication: Mobility status PT Visit Diagnosis: Muscle weakness (generalized) (M62.81);Difficulty in walking, not elsewhere classified (R26.2);Unsteadiness on feet (R26.81)     Time: 1610-9604 PT Time Calculation (min) (ACUTE ONLY): 29 min  Charges:    $Therapeutic Activity: 23-37 mins PT General Charges $$ ACUTE PT VISIT: 1 Visit                    D. Scott Aalijah Lanphere PT, DPT 05/21/23, 3:13 PM

## 2023-05-21 NOTE — Progress Notes (Signed)
 Progress Note   Patient: Robert [name] WRU:[id number] DOB: 06/05/1945 DOA: 05/16/2023     4 DOS: the patient was seen and examined on 05/21/2023   Brief hospital course: Robert [name] is a 78 y.o. 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   The patient's hemoglobin upon admission was 7.0 for which he received transfusion with 1 unit PRBC's. The following day on 05/17/2023 the patient's hemoglobin was 6.7. He received a second unit of PRBC's. This briefly brought up the patient's hemoglobin to 7.3 on the evening of 05/17/2023. However, the following morning it was again 7.0 on 05/18/2023. On the morning of 05/19/2023 the patient's hemoglobin was again low at 6.8. He is receiving a third unit of PRBC's today. Anemia panel demonstrates Iron level is low at 42, Ferritin elevated at 1442, Folate low at 5.1.  On 05/21/2023 the patient's creatinine was 4.34. Nephrology was consulted. Dr. Thedore Mins states that this decrease in renal function is likely due to ATN vs acute interstitial nephritis due to chemotherapy or antibiotics. It appears that the rate of increase has begun to slow. Will continue to monitor and avoid hypotension and nephrotoxins.  Assessment and Plan: * Acute on CKD stage 3B: Baseline creatinine ranging between around 2.25 and is now 4.34.  CT renal stone study in February demonstrated severely atrophic left kidney. Renal ultrasound medical renal disease. CK - 56. Urinalysis abnormal, no complaints.  On 05/21/2023 the patient's creatinine was 4.34. Nephrology was consulted. Dr. Thedore Mins states that this decrease in renal function is likely due to ATN vs acute interstitial nephritis due to chemotherapy or  antibiotics. It appears that the rate of increase has begun to slow. Will continue to monitor and avoid hypotension and nephrotoxins.Monitor daily renal function. Mild improvement in creatinine. Hold all nephrotoxic agents. Monitor creatinine, electrolytes, and volume status carefully.  Anemia of Chronic disease and iron deficiency Chronic severe anemia with hemoglobin between 7-8, now 6.8. The patient's hemoglobin upon admission was 7.0 for which he received transfusion with 1 unit PRBC's. The following day on 05/17/2023 the patient's hemoglobin was 6.7. He received a second unit of PRBC's. This briefly brought up the patient's hemoglobin to 7.3 on the evening of 05/17/2023. However, the following morning it was again 7.0 on 05/18/2023. On the morning of 05/19/2023 the patient's hemoglobin was again low at 6.8. He is receiving a third unit of PRBC's today. The source of the patient's anemia is thought to be multifactorial. 1) chemotherapy 2) Anemia of chronic disease 3) anemia due to chronic renal disease. Iron level is low at 42, Ferritin is high at 1442, and folate is low at 5.1. Will supplement folate. Elevated ferritin is likely due to ongoing inflammation and renal disease. Will supplement iron.   Hypomagnesemia- Improved with supplementation. Monitor.  Hypocalcemia- Calcium low, but corrected to 8.9 due to low albumin.  AML (acute myeloblastic leukemia) (HCC) Patient has a history of recently diagnosed acute myeloid leukemia on Venetoclax and Decitabine. Oncology saw him yesterday in clinic. Hold chemo for now. Continue to follow up outpatient.  Chronic systolic CHF (congestive heart failure) (HCC) Patient has a history of HFrEF with last EF of 45-50%.  He appears euvolemic at this time. Hold home Lasix in the  setting of AKI. Monitor Daily weights, volume status. Appears euvolemic at this time.   Thrombocytopenia (HCC) Severe chronic thrombocytopenia in the setting of chemotherapy. Improving.  Platelets are 83 this morning.  Type 2 diabetes mellitus with complication (HCC) Diet-controlled type 2 diabetes with last A1c of 5.0%.  Essential hypertension The patient is normotensive on Metoprolol succinate 50 mg daily. Continue to monitor.     He is resident on SNF. Out of bed to chair. Incentive spirometry. Nursing supportive care. Fall, aspiration precautions. Diet:  Diet Orders (From admission, onward)     Start     Ordered   05/16/23 1308  Diet regular Room service appropriate? Yes; Fluid consistency: Thin  Diet effective now       Question Answer Comment  Room service appropriate? Yes   Fluid consistency: Thin      05/16/23 1307           DVT prophylaxis: SCDs Start: 05/16/23 1308  Level of care: med-surg   Code Status: Full Code  Subjective: Patient is seen and examined today morning.  Patient continues to feel weak.  Denies of any pain.  Creatinine mildly improved, hemoglobin 8.2 on the morning of 05/20/2023.   Physical Exam: Vitals:   05/20/23 1706 05/20/23 2141 05/21/23 0520 05/21/23 0825  BP: 139/80 124/76 137/87 127/81  Pulse: 83 85 85 81  Resp: 18  16 16   Temp: (!) 97.4 F (36.3 C) 98 F (36.7 C) (!) 97.5 F (36.4 C) 98 F (36.7 C)  TempSrc:      SpO2: 100% 99% 99% 100%  Weight:      Height:        Exam:  Constitutional:  The patient is awake, alert, and oriented x 3. No acute distress. Respiratory:  No increased work of breathing. No wheezes, rales, or rhonchi No tactile fremitus Cardiovascular:  Regular rate and rhythm No murmurs, ectopy, or gallups. No lateral PMI. No thrills. Abdomen:  Abdomen is soft, non-tender, non-distended No hernias, masses, or organomegaly Normoactive bowel sounds.  Musculoskeletal:  No cyanosis, clubbing, or edema Skin:  No rashes, lesions, ulcers palpation of skin: no induration or nodules Neurologic:  CN 2-12 intact Sensation all 4 extremities intact Psychiatric:  Mental status Mood, affect  appropriate Orientation to person, place, time  judgment and insight appear intact  Data Reviewed:      Latest Ref Rng & Units 05/21/2023    8:32 AM 05/20/2023    3:56 AM 05/19/2023    4:09 AM  CBC  WBC 4.0 - 10.5 K/uL 9.4  11.7  11.7   Hemoglobin 13.0 - 17.0 g/dL 7.8  8.2  6.8   Hematocrit 39.0 - 52.0 % 24.2  24.7  21.4   Platelets 150 - 400 K/uL 83  70  58       Latest Ref Rng & Units 05/20/2023    3:56 AM 05/19/2023    4:09 AM 05/18/2023    5:20 AM  BMP  Glucose 70 - 99 mg/dL 161  88  096   BUN 8 - 23 mg/dL 67  67  69   Creatinine 0.61 - 1.24 mg/dL 0.45  4.09  8.11   Sodium 135 - 145 mmol/L 135  134  135   Potassium 3.5 - 5.1 mmol/L 4.1  4.0  3.7   Chloride 98 - 111 mmol/L 105  105  104   CO2 22 - 32 mmol/L 20  20  20    Calcium 8.9 - 10.3 mg/dL 7.2  6.9  6.8    No results found.   Family Communication: None available.  Disposition: Status is: Observation The patient will require care spanning > 2 midnights and should be moved to inpatient because: AKI  Planned Discharge Destination: Skilled nursing facility     Time spent: 32 minutes  Author: Milika Ventress, DO 05/21/2023 2:45 PM Secure chat 7am to 7pm For on call review [email or web address].

## 2023-05-21 NOTE — TOC Progression Note (Signed)
 Transition of Care [facility]) - Progression Note    Patient Details  Name: Robert [name] MRN: [id number] Date of Birth: [birth year]  Transition of Care [facility]) CM/SW Contact  Cherre Blanc, RN Phone Number: 05/21/2023, 10:15 AM  Clinical Narrative:     Jory Sims spoke with Tobi Bastos at [facility] [phone]. Patient can go to SNF at [facility] as long as we have insurance auth. Request for ins auth sent to [facility].  Expected Discharge Plan: Long Term Nursing Home Barriers to Discharge: No Barriers Identified  Expected Discharge Plan and Services     Post Acute Care Choice: Long Term Acute Care (LTAC)                                         Social Determinants of Health (SDOH) Interventions SDOH Screenings   Food Insecurity: No Food Insecurity (05/16/2023)  Housing: Low Risk  (05/16/2023)  Transportation Needs: No Transportation Needs (05/16/2023)  Utilities: Not At Risk (05/16/2023)  Alcohol Screen: Low Risk  (10/29/2020)  Depression (PHQ2-9): Low Risk  (09/12/2021)  Financial Resource Strain: Low Risk  (12/15/2022)   Received from [facility]  Physical Activity: Inactive (10/29/2020)  Social Connections: Patient Declined (05/16/2023)  Stress: No Stress Concern Present (10/29/2020)  Tobacco Use: Low Risk  (05/16/2023)    Readmission Risk Interventions    04/04/2023   12:56 PM 02/07/2023   12:33 PM  Readmission Risk Prevention Plan  Transportation Screening Complete Complete  Medication Review (RN Care Manager) Complete Complete  PCP or Specialist appointment within 3-5 days of discharge Complete Complete  SW Recovery Care/Counseling Consult Complete Complete  Palliative Care Screening Not Applicable Not Applicable  Skilled Nursing Facility Complete Complete

## 2023-05-21 NOTE — Care Management Important Message (Signed)
 Important Message  Patient Details  Name: Robert [name] MRN: [id number] Date of Birth: [birth year]   Important Message Given:  Yes - Medicare IM     Cristela Blue, CMA 05/21/2023, 11:39 AM

## 2023-05-22 ENCOUNTER — Telehealth: Payer: Self-pay | Admitting: *Deleted

## 2023-05-22 DIAGNOSIS — N179 Acute kidney failure, unspecified: Secondary | ICD-10-CM | POA: Diagnosis not present

## 2023-05-22 DIAGNOSIS — N1831 Chronic kidney disease, stage 3a: Secondary | ICD-10-CM

## 2023-05-22 LAB — BASIC METABOLIC PANEL WITH GFR
Anion gap: 10 (ref 5–15)
BUN: 70 mg/dL — ABNORMAL HIGH (ref 8–23)
CO2: 20 mmol/L — ABNORMAL LOW (ref 22–32)
Calcium: 7.5 mg/dL — ABNORMAL LOW (ref 8.9–10.3)
Chloride: 108 mmol/L (ref 98–111)
Creatinine, Ser: 3.9 mg/dL — ABNORMAL HIGH (ref 0.61–1.24)
GFR, Estimated: 15 mL/min — ABNORMAL LOW (ref >60)
Glucose, Bld: 86 mg/dL (ref 70–99)
Potassium: 4.4 mmol/L (ref 3.5–5.1)
Sodium: 138 mmol/L (ref 135–145)

## 2023-05-22 LAB — CBC
HCT: 27.7 % — ABNORMAL LOW (ref 39.0–52.0)
Hemoglobin: 8.5 g/dL — ABNORMAL LOW (ref 13.0–17.0)
MCH: 27.8 pg (ref 26.0–34.0)
MCHC: 30.7 g/dL (ref 30.0–36.0)
MCV: 90.5 fL (ref 80.0–100.0)
Platelets: 98 10*3/uL — ABNORMAL LOW (ref 150–400)
RBC: 3.06 MIL/uL — ABNORMAL LOW (ref 4.22–5.81)
RDW: 17.2 % — ABNORMAL HIGH (ref 11.5–15.5)
WBC: 10 10*3/uL (ref 4.0–10.5)
nRBC: 0.2 % (ref 0.0–0.2)

## 2023-05-22 MED ORDER — ENSURE ENLIVE PO LIQD
237.0000 mL | Freq: Two times a day (BID) | ORAL | Status: DC
  Administered 2023-05-22 – 2023-05-25 (×6): 237 mL via ORAL

## 2023-05-22 MED ORDER — POLYETHYLENE GLYCOL 3350 17 G PO PACK
17.0000 g | PACK | Freq: Every day | ORAL | Status: DC
Start: 2023-05-22 — End: 2023-05-25
  Administered 2023-05-23 – 2023-05-25 (×2): 17 g via ORAL
  Filled 2023-05-22 (×3): qty 1

## 2023-05-22 MED ORDER — SENNOSIDES-DOCUSATE SODIUM 8.6-50 MG PO TABS
1.0000 | ORAL_TABLET | Freq: Two times a day (BID) | ORAL | Status: DC
  Administered 2023-05-22 – 2023-05-25 (×4): 1 via ORAL
  Filled 2023-05-22 (×4): qty 1

## 2023-05-22 MED ORDER — OXYCODONE-ACETAMINOPHEN 5-325 MG PO TABS
1.0000 | ORAL_TABLET | ORAL | Status: DC | PRN
  Administered 2023-05-22: 1 via ORAL
  Filled 2023-05-22 (×2): qty 1

## 2023-05-22 MED ORDER — MAGNESIUM HYDROXIDE 400 MG/5ML PO SUSP
15.0000 mL | Freq: Once | ORAL | Status: AC
  Administered 2023-05-22: 15 mL via ORAL
  Filled 2023-05-22: qty 30

## 2023-05-22 NOTE — Telephone Encounter (Signed)
 I looked in the appts and his chemo is 4/9. If the wife wants to call back or has any other issues with her husband. She had said that the chemo is this Friday but the date in the system says 4/9

## 2023-05-22 NOTE — Progress Notes (Signed)
 PROGRESS NOTE  Robert [name]  QIO:[id number] DOB: 07-01-1945 DOA: 05/16/2023 PCP: Corky Downs, MD   Brief Narrative: Patient  is a 78 y.o. 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  On presentation, hemoglobin was 7.  Due to worsening kidney function, nephrology consulted and following.  Oncology also following.  Mild improvement in the kidney function today  Assessment & Plan:  Principal Problem:   AKI (acute kidney injury) (HCC) Active Problems:   Normocytic anemia   AML (acute myeloblastic leukemia) (HCC)   Chronic systolic CHF (congestive heart failure) (HCC)   Thrombocytopenia (HCC)   Type 2 diabetes mellitus with complication (HCC)   Essential hypertension   Acute on CKD stage 3B: Baseline creatinine ranging between around 2.25 and which trended up in the range of 4.  CT renal stone study in February demonstrated severely atrophic left kidney. Renal ultrasound medical renal disease. CK - 56. Urinalysis abnormal, no complaints.  On 05/21/2023 the patient's creatinine was 4.34. Nephrology was consulted. Dr. Thedore Mins states that this decrease in renal function is likely due to ATN vs acute interstitial nephritis due to chemotherapy or antibiotics. It appears that the rate of increase has begun to slow. Will continue to monitor and avoid hypotension and nephrotoxins.Monitor daily renal function.  Hold all nephrotoxic agents. Monitor creatinine, electrolytes, and volume status carefully.  Currently creatinine in the range of 3   Anemia of Chronic disease and iron deficiency Chronic severe anemia with hemoglobin between 7-8, now 6.8. The patient's hemoglobin upon admission was 7.0 .status post 3  units of blood transfusion during this hospitalization. The source of the patient's anemia is thought to be multifactorial.  Likely from chemotherapy, CKD   Hypomagnesemia- Improved with supplementation. Monitor.   Hypocalcemia- Calcium low, but corrected to 8.9 due to low albumin.   AML  Patient has a history of recently diagnosed acute myeloid leukemia on Venetoclax and Decitabine. Oncology following him. Holding chemo for now. Continue to follow up outpatient.   Chronic systolic CHF Patient has a history of HFrEF with last EF of 45-50%.  He appears euvolemic at this time. Hold home Lasix in the setting of AKI. Monitor Daily weights, volume status. Appears euvolemic at this time.    Thrombocytopenia  Severe chronic thrombocytopenia in the setting of chemotherapy. Improving. Platelets are 83 this morning.   Type 2 diabetes mellitus with complication (HCC) Diet-controlled type 2 diabetes with last A1c of 5.0%.   Essential hypertension The patient is normotensive on Metoprolol succinate 50 mg daily. Continue to monitor.  Weakness/debility: PT/OT consulted.  Patient is a resident of [facility].  TOC following       DVT prophylaxis:SCDs Start: 05/16/23 1308     Code Status: Full Code  Family Communication: None at the bedside  Patient status: Inpatient  Patient is from : Home  Anticipated discharge to: Home  Estimated DC date: 1 to 2 days, pending further improvement in the kidney function   Consultants: Nephrology, oncology  Procedures: None  Antimicrobials:  Anti-infectives (From admission, onward)    None       Subjective: Patient seen and examined at bedside today.  Hemodynamically stable.  Comfortable.  Lying in bed.  Eating his breakfast.  Denies any complaints  except for no bowel movement for last 3 to 4 days.  No shortness of breath or chest pain or cough.  On room air.  No peripheral edema noted  Objective: Vitals:   05/21/23 1726 05/21/23 1943  05/22/23 0605 05/22/23 0752  BP: 122/74 112/71 131/81 (!) 130/90  Pulse: 89 84 83 88  Resp: 18 18 18 17   Temp: 97.6 F (36.4 C) 98.7 F (37.1 C) 97.7 F (36.5 C) 98.2 F (36.8 C)  TempSrc: Oral Oral Oral   SpO2: 100% 99% 98% 99%  Weight:      Height:        Intake/Output Summary (Last 24 hours) at 05/22/2023 0913 Last data filed at 05/22/2023 0749 Gross per 24 hour  Intake --  Output 2600 ml  Net -2600 ml   Filed Weights   05/16/23 1057 05/16/23 2055 05/19/23 0500  Weight: 99.8 kg 95.9 kg 100 kg    Examination:  General exam: Overall comfortable, not in distress, pleasant elderly male HEENT: PERRL Respiratory system:  no wheezes or crackles , mildly diminished air sounds on bases Cardiovascular system: S1 & S2 heard, RRR.  Gastrointestinal system: Abdomen is nondistended, soft and nontender. Central nervous system: Alert and oriented Extremities: No edema, no clubbing ,no cyanosis Skin: Scattered bruises   Data Reviewed: I have personally reviewed following labs and imaging studies  CBC: Recent Labs  Lab 05/16/23 0921 05/16/23 2139 05/18/23 0520 05/19/23 0409 05/20/23 0356 05/21/23 0832 05/22/23 0518  WBC 13.5*   < > 13.9* 11.7* 11.7* 9.4 10.0  NEUTROABS 8.2*  --   --  6.1  --   --   --   HGB 7.0*   < > 7.0* 6.8* 8.2* 7.8* 8.5*  HCT 23.4*   < > 22.0* 21.4* 24.7* 24.2* 27.7*  MCV 93.2   < > 88.0 88.8 86.1 88.3 90.5  PLT 41*   < > 46* 58* 70* 83* 98*   < > = values in this interval not displayed.   Basic Metabolic Panel: Recent Labs  Lab 05/16/23 1338 05/17/23 0503 05/18/23 0520 05/19/23 0409 05/20/23 0356 05/21/23 0826 05/22/23 0518  NA  --  138 135 134* 135  --  138  K  --  3.9 3.7 4.0 4.1  --  4.4  CL  --  106 104 105 105  --  108  CO2  --  20* 20* 20* 20*  --  20*  GLUCOSE  --  93 116* 88 133*  --  86  BUN  --  71* 69* 67* 67*  --  70*  CREATININE  --  4.42* 4.38* 4.34* 4.34*  --  3.90*  CALCIUM  --  6.7* 6.8* 6.9* 7.2*  --  7.5*  MG 1.0* 1.7   --  1.4*  --  1.5*  --   PHOS 5.3*  --   --   --   --   --   --      Recent Results (from the past 240 hours)  MRSA Next Gen by PCR, Nasal     Status: None   Collection Time: 05/16/23  9:55 PM   Specimen: Nasal Mucosa; Nasal Swab  Result Value Ref Range Status   MRSA by PCR Next Gen NOT DETECTED NOT DETECTED Final    Comment: (NOTE) The GeneXpert MRSA Assay (FDA approved for NASAL specimens only), is one component of a comprehensive MRSA colonization surveillance program. It is not intended to diagnose MRSA infection nor to guide  or monitor treatment for MRSA infections. Test performance is not FDA approved in patients less than 67 years old. Performed at [facility], [address] [place], [place] [address]      Radiology Studies: No results found.  Scheduled Meds:  DULoxetine  60 mg Oral BID   feeding supplement  237 mL Oral BID BM   ferrous sulfate  325 mg Oral Q breakfast   folic acid  1 mg Oral Daily   latanoprost  1 drop Both Eyes QHS   magnesium hydroxide  15 mL Oral Once   melatonin  5 mg Oral QHS   metoprolol succinate  50 mg Oral Daily   mirtazapine  15 mg Oral QHS   oxybutynin  10 mg Oral QPM   pantoprazole  40 mg Oral Daily   simvastatin  20 mg Oral Daily   sodium chloride flush  3 mL Intravenous Q12H   Continuous Infusions:  sodium chloride Stopped (05/16/23 1215)     LOS: 5 days   Burnadette Pop, MD [facility] P4/02/2023, 9:13 AM

## 2023-05-22 NOTE — Progress Notes (Addendum)
 [facility]  ROUNDING NOTE   Subjective:   Patient seen resting in bed Alert and oriented Completed breakfast tray (fruit tray) at bedside Denies nausea or vomiting  Creatinine 3.90 Urine output 2 L recorded in previous 24 hours  Objective:  Vital signs in last 24 hours:  Temp:  [97.6 F (36.4 C)-98.7 F (37.1 C)] 98.2 F (36.8 C) (04/01 0752) Pulse Rate:  [83-89] 88 (04/01 0752) Resp:  [17-18] 17 (04/01 0752) BP: (112-131)/(71-90) 130/90 (04/01 0752) SpO2:  [98 %-100 %] 99 % (04/01 0752)  Weight change:  Filed Weights   05/16/23 1057 05/16/23 2055 05/19/23 0500  Weight: 99.8 kg 95.9 kg 100 kg    Intake/Output: I/O last 3 completed shifts: In: 6 [I.V.:6] Out: 2750 [Urine:2750]   Intake/Output this shift:  Total I/O In: -  Out: 600 [Urine:600]  Physical Exam: General: NAD  Head: Normocephalic, atraumatic. Moist oral mucosal membranes  Eyes: Anicteric  Lungs:  Clear to auscultation, normal effort  Heart: Regular rate and rhythm  Abdomen:  Soft, tenderness,   Extremities: Trace peripheral edema.  Neurologic: Alert and oriented, moving all four extremities  Skin: No lesions       Basic Metabolic Panel: Recent Labs  Lab 05/16/23 1338 05/17/23 0503 05/18/23 0520 05/19/23 0409 05/20/23 0356 05/21/23 0826 05/22/23 0518  NA  --  138 135 134* 135  --  138  K  --  3.9 3.7 4.0 4.1  --  4.4  CL  --  106 104 105 105  --  108  CO2  --  20* 20* 20* 20*  --  20*  GLUCOSE  --  93 116* 88 133*  --  86  BUN  --  71* 69* 67* 67*  --  70*  CREATININE  --  4.42* 4.38* 4.34* 4.34*  --  3.90*  CALCIUM  --  6.7* 6.8* 6.9* 7.2*  --  7.5*  MG 1.0* 1.7  --  1.4*  --  1.5*  --   PHOS 5.3*  --   --   --   --   --   --     Liver Function Tests: Recent Labs  Lab 05/16/23 0921 05/19/23 0409  AST 22 14*  ALT 11 9  ALKPHOS 105 69  BILITOT 0.8 0.7  PROT 7.4 5.9*  ALBUMIN 1.6* <1.5*   No results for input(s): "LIPASE", "AMYLASE" in the last 168  hours. No results for input(s): "AMMONIA" in the last 168 hours.  CBC: Recent Labs  Lab 05/16/23 0921 05/16/23 2139 05/18/23 0520 05/19/23 0409 05/20/23 0356 05/21/23 0832 05/22/23 0518  WBC 13.5*   < > 13.9* 11.7* 11.7* 9.4 10.0  NEUTROABS 8.2*  --   --  6.1  --   --   --   HGB 7.0*   < > 7.0* 6.8* 8.2* 7.8* 8.5*  HCT 23.4*   < > 22.0* 21.4* 24.7* 24.2* 27.7*  MCV 93.2   < > 88.0 88.8 86.1 88.3 90.5  PLT 41*   < > 46* 58* 70* 83* 98*   < > = values in this interval not displayed.    Cardiac Enzymes: Recent Labs  Lab 05/16/23 1338  CKTOTAL 56    BNP: Invalid input(s): "POCBNP"  CBG: No results for input(s): "GLUCAP" in the last 168 hours.  Microbiology: Results for orders placed or performed during the hospital encounter of 05/16/23  MRSA Next Gen by PCR, Nasal     Status: None  Collection Time: 05/16/23  9:55 PM   Specimen: Nasal Mucosa; Nasal Swab  Result Value Ref Range Status   MRSA by PCR Next Gen NOT DETECTED NOT DETECTED Final    Comment: (NOTE) The GeneXpert MRSA Assay (FDA approved for NASAL specimens only), is one component of a comprehensive MRSA colonization surveillance program. It is not intended to diagnose MRSA infection nor to guide or monitor treatment for MRSA infections. Test performance is not FDA approved in patients less than 24 years old. Performed at [facility], [address] [place], [place] [address]     Coagulation Studies: No results for input(s): "LABPROT", "INR" in the last 72 hours.  Urinalysis: No results for input(s): "COLORURINE", "LABSPEC", "PHURINE", "GLUCOSEU", "HGBUR", "BILIRUBINUR", "KETONESUR", "PROTEINUR", "UROBILINOGEN", "NITRITE", "LEUKOCYTESUR" in the last 72 hours.  Invalid input(s): "APPERANCEUR"    Imaging: No results found.   Medications:    sodium chloride Stopped (05/16/23 1215)    DULoxetine  60 mg Oral BID   feeding supplement  237 mL Oral BID BM   ferrous sulfate  325 mg Oral Q  breakfast   folic acid  1 mg Oral Daily   latanoprost  1 drop Both Eyes QHS   melatonin  5 mg Oral QHS   metoprolol succinate  50 mg Oral Daily   mirtazapine  15 mg Oral QHS   oxybutynin  10 mg Oral QPM   pantoprazole  40 mg Oral Daily   polyethylene glycol  17 g Oral Daily   senna-docusate  1 tablet Oral BID   simvastatin  20 mg Oral Daily   sodium chloride flush  3 mL Intravenous Q12H   acetaminophen **OR** acetaminophen, loperamide, ondansetron **OR** ondansetron (ZOFRAN) IV, oxyCODONE-acetaminophen  Assessment/ Plan:  [name] is a 78 y.o.  male with medical problems of acute myeloid leukoma, underlying chemotherapy, history of sigmoid colon cancer status post partial colectomy in November 2024, chronic kidney disease, anemia, diabetes, peripheral arterial disease, chronic heart failure with reduced ejection fraction with a EF of 40%, history of pericardial effusion status post pericardial window. was admitted on 05/16/2023 for AKI (acute kidney injury) (HCC) [N17.9] Symptomatic anemia [D64.9]   Acute Kidney Injury on chronic kidney disease stage 4 with baseline creatinine 2.25 and GFR of 29 on 05/09/2023.  Proteinuria.   Pyuria Previous CT in February 2025 showed severely atrophic left kidney. Urinalysis from 05/16/2023 shows moderate hemoglobin, large leukocytes, proteinuria, 11-20 RBCs and greater than 50 WBCs. Differential includes ATN versus acute interstitial nephritis from chemo versus various antibiotics.  Creatinine has shown some improvement today.  Patient making decent urine output, 2 L.  No acute indication for dialysis.  Continue to avoid nephrotoxic agents and therapies.  Will continue to monitor.  Lab Results  Component Value Date   CREATININE 3.90 (H) 05/22/2023   CREATININE 4.34 (H) 05/20/2023   CREATININE 4.34 (H) 05/19/2023    Intake/Output Summary (Last 24 hours) at 05/22/2023 1356 Last data filed at 05/22/2023 0749 Gross per 24 hour  Intake --   Output 1800 ml  Net -1800 ml   2. Anemia of chronic kidney disease with thrombocytopenia Lab Results  Component Value Date   HGB 8.5 (L) 05/22/2023    Hemoglobin decreased.  Patient has received blood transfusions during this admission.  Will monitor for now.  3.  Chronic systolic heart failure echo completed on 04/04/2023 shows EF 45 to 50%.  Furosemide held. He has generalized edema due to third spacing of fluid.  Albumin  is extremely low at less than 1.5 on 05/19/2023.  Consider oncotic support with IV albumin for the short-term for volume optimization.  We will continue to monitor.   LOS: 5 Shantelle Breeze 4/1/20251:56 PM   Patient was seen and examined with Wendee Beavers, NP.  Plan of care was formulated for the problems addressed and discussed with NP.  I agree with the note as documented except as noted below.

## 2023-05-23 ENCOUNTER — Ambulatory Visit: Admitting: Oncology

## 2023-05-23 ENCOUNTER — Other Ambulatory Visit

## 2023-05-23 ENCOUNTER — Ambulatory Visit

## 2023-05-23 ENCOUNTER — Other Ambulatory Visit: Payer: Self-pay

## 2023-05-23 DIAGNOSIS — N179 Acute kidney failure, unspecified: Secondary | ICD-10-CM | POA: Diagnosis not present

## 2023-05-23 DIAGNOSIS — I5022 Chronic systolic (congestive) heart failure: Secondary | ICD-10-CM | POA: Diagnosis not present

## 2023-05-23 DIAGNOSIS — C9200 Acute myeloblastic leukemia, not having achieved remission: Secondary | ICD-10-CM | POA: Diagnosis not present

## 2023-05-23 LAB — COMP PANEL: LEUKEMIA/LYMPHOMA: Immunophenotypic Profile: 6

## 2023-05-23 LAB — BASIC METABOLIC PANEL WITH GFR
Anion gap: 10 (ref 5–15)
BUN: 71 mg/dL — ABNORMAL HIGH (ref 8–23)
CO2: 21 mmol/L — ABNORMAL LOW (ref 22–32)
Calcium: 7.6 mg/dL — ABNORMAL LOW (ref 8.9–10.3)
Chloride: 104 mmol/L (ref 98–111)
Creatinine, Ser: 3.7 mg/dL — ABNORMAL HIGH (ref 0.61–1.24)
GFR, Estimated: 16 mL/min — ABNORMAL LOW (ref >60)
Glucose, Bld: 100 mg/dL — ABNORMAL HIGH (ref 70–99)
Potassium: 4.5 mmol/L (ref 3.5–5.1)
Sodium: 135 mmol/L (ref 135–145)

## 2023-05-23 LAB — MAGNESIUM: Magnesium: 1.4 mg/dL — ABNORMAL LOW (ref 1.7–2.4)

## 2023-05-23 LAB — ALBUMIN: Albumin: <1.5 g/dL — ABNORMAL LOW (ref 3.5–5.0)

## 2023-05-23 MED ORDER — SODIUM BICARBONATE 650 MG PO TABS
650.0000 mg | ORAL_TABLET | Freq: Two times a day (BID) | ORAL | Status: DC
  Administered 2023-05-23 – 2023-05-25 (×4): 650 mg via ORAL
  Filled 2023-05-23 (×4): qty 1

## 2023-05-23 MED ORDER — ALBUMIN HUMAN 25 % IV SOLN
12.5000 g | Freq: Two times a day (BID) | INTRAVENOUS | Status: DC
  Administered 2023-05-23 – 2023-05-25 (×4): 12.5 g via INTRAVENOUS
  Filled 2023-05-23 (×5): qty 50

## 2023-05-23 MED ORDER — MAGNESIUM SULFATE 4 GM/100ML IV SOLN
4.0000 g | Freq: Once | INTRAVENOUS | Status: AC
  Administered 2023-05-23: 4 g via INTRAVENOUS
  Filled 2023-05-23: qty 100

## 2023-05-23 NOTE — Progress Notes (Signed)
 [facility]  ROUNDING NOTE   Subjective:   Patient sitting up in bed Currently eating breakfast Room air, denies shortness of breath  Creatinine 3.70 Urine output 2 L recorded in previous 24 hours  Objective:  Vital signs in last 24 hours:  Temp:  [97.5 F (36.4 C)-97.9 F (36.6 C)] 97.5 F (36.4 C) (04/02 0915) Pulse Rate:  [87-96] 96 (04/02 0915) Resp:  [18-20] 18 (04/02 0915) BP: (128-137)/(79-85) 137/85 (04/02 0915) SpO2:  [98 %-100 %] 100 % (04/02 0915)  Weight change:  Filed Weights   05/16/23 1057 05/16/23 2055 05/19/23 0500  Weight: 99.8 kg 95.9 kg 100 kg    Intake/Output: I/O last 3 completed shifts: In: -  Out: 3200 [Urine:3200]   Intake/Output this shift:  Total I/O In: -  Out: 700 [Urine:700]  Physical Exam: General: NAD  Head: Normocephalic, atraumatic. Moist oral mucosal membranes  Eyes: Anicteric  Lungs:  Clear to auscultation, normal effort  Heart: Regular rate and rhythm  Abdomen:  Soft, tenderness,   Extremities: Trace peripheral edema.  Neurologic: Alert and oriented, moving all four extremities  Skin: No lesions       Basic Metabolic Panel: Recent Labs  Lab 05/17/23 0503 05/18/23 0520 05/19/23 0409 05/20/23 0356 05/21/23 0826 05/22/23 0518 05/23/23 0347  NA 138 135 134* 135  --  138 135  K 3.9 3.7 4.0 4.1  --  4.4 4.5  CL 106 104 105 105  --  108 104  CO2 20* 20* 20* 20*  --  20* 21*  GLUCOSE 93 116* 88 133*  --  86 100*  BUN 71* 69* 67* 67*  --  70* 71*  CREATININE 4.42* 4.38* 4.34* 4.34*  --  3.90* 3.70*  CALCIUM 6.7* 6.8* 6.9* 7.2*  --  7.5* 7.6*  MG 1.7  --  1.4*  --  1.5*  --  1.4*    Liver Function Tests: Recent Labs  Lab 05/19/23 0409 05/23/23 0347  AST 14*  --   ALT 9  --   ALKPHOS 69  --   BILITOT 0.7  --   PROT 5.9*  --   ALBUMIN <1.5* <1.5*   No results for input(s): "LIPASE", "AMYLASE" in the last 168 hours. No results for input(s): "AMMONIA" in the last 168 hours.  CBC: Recent Labs   Lab 05/18/23 0520 05/19/23 0409 05/20/23 0356 05/21/23 0832 05/22/23 0518  WBC 13.9* 11.7* 11.7* 9.4 10.0  NEUTROABS  --  6.1  --   --   --   HGB 7.0* 6.8* 8.2* 7.8* 8.5*  HCT 22.0* 21.4* 24.7* 24.2* 27.7*  MCV 88.0 88.8 86.1 88.3 90.5  PLT 46* 58* 70* 83* 98*    Cardiac Enzymes: No results for input(s): "CKTOTAL", "CKMB", "CKMBINDEX", "TROPONINI" in the last 168 hours.   BNP: Invalid input(s): "POCBNP"  CBG: No results for input(s): "GLUCAP" in the last 168 hours.  Microbiology: Results for orders placed or performed during the hospital encounter of 05/16/23  MRSA Next Gen by PCR, Nasal     Status: None   Collection Time: 05/16/23  9:55 PM   Specimen: Nasal Mucosa; Nasal Swab  Result Value Ref Range Status   MRSA by PCR Next Gen NOT DETECTED NOT DETECTED Final    Comment: (NOTE) The GeneXpert MRSA Assay (FDA approved for NASAL specimens only), is one component of a comprehensive MRSA colonization surveillance program. It is not intended to diagnose MRSA infection nor to guide or monitor treatment for MRSA infections.  Test performance is not FDA approved in patients less than 24 years old. Performed at [facility], [address] [place], [place] [address]     Coagulation Studies: No results for input(s): "LABPROT", "INR" in the last 72 hours.  Urinalysis: No results for input(s): "COLORURINE", "LABSPEC", "PHURINE", "GLUCOSEU", "HGBUR", "BILIRUBINUR", "KETONESUR", "PROTEINUR", "UROBILINOGEN", "NITRITE", "LEUKOCYTESUR" in the last 72 hours.  Invalid input(s): "APPERANCEUR"    Imaging: No results found.   Medications:    albumin human     sodium chloride Stopped (05/16/23 1215)    DULoxetine  60 mg Oral BID   feeding supplement  237 mL Oral BID BM   ferrous sulfate  325 mg Oral Q breakfast   folic acid  1 mg Oral Daily   latanoprost  1 drop Both Eyes QHS   melatonin  5 mg Oral QHS   metoprolol succinate  50 mg Oral Daily   mirtazapine  15  mg Oral QHS   oxybutynin  10 mg Oral QPM   pantoprazole  40 mg Oral Daily   polyethylene glycol  17 g Oral Daily   senna-docusate  1 tablet Oral BID   simvastatin  20 mg Oral Daily   sodium chloride flush  3 mL Intravenous Q12H   acetaminophen **OR** acetaminophen, loperamide, ondansetron **OR** ondansetron (ZOFRAN) IV, oxyCODONE-acetaminophen  Assessment/ Plan:  [name] is a 78 y.o.  male with medical problems of acute myeloid leukoma, underlying chemotherapy, history of sigmoid colon cancer status post partial colectomy in November 2024, chronic kidney disease, anemia, diabetes, peripheral arterial disease, chronic heart failure with reduced ejection fraction with a EF of 40%, history of pericardial effusion status post pericardial window. was admitted on 05/16/2023 for AKI (acute kidney injury) (HCC) [N17.9] Symptomatic anemia [D64.9]   Acute Kidney Injury on chronic kidney disease stage 4 with baseline creatinine 2.25 and GFR of 29 on 05/09/2023.  Proteinuria.   Pyuria Previous CT in February 2025 showed severely atrophic left kidney. Urinalysis from 05/16/2023 shows moderate hemoglobin, large leukocytes, proteinuria, 11-20 RBCs and greater than 50 WBCs. Differential includes ATN versus acute interstitial nephritis from chemo versus various antibiotics.  Creatinine slowly improving.  Adequate urine output noted.  Will order albumin 12.5 g twice daily to optimize fluid removal.  Continue to avoid nephrotoxic agents and therapies.  No acute indication for dialysis.  Lab Results  Component Value Date   CREATININE 3.70 (H) 05/23/2023   CREATININE 3.90 (H) 05/22/2023   CREATININE 4.34 (H) 05/20/2023    Intake/Output Summary (Last 24 hours) at 05/23/2023 1408 Last data filed at 05/23/2023 1056 Gross per 24 hour  Intake --  Output 2100 ml  Net -2100 ml   2. Anemia of chronic kidney disease with thrombocytopenia Lab Results  Component Value Date   HGB 8.5 (L) 05/22/2023     Patient has received blood transfusions during this admission.  Will monitor for now.  3.  Chronic systolic heart failure echo completed on 04/04/2023 shows EF 45 to 50%.  Furosemide held. He has generalized edema due to third spacing of fluid.  Albumin is extremely low at less than 1.5 on 05/19/2023.  IV albumin ordered as above.  We will continue to monitor.   LOS: 6 Ajahni Nay 4/2/20252:08 PM

## 2023-05-23 NOTE — Hospital Course (Signed)
 Patient  is a 78 y.o. 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  On presentation, hemoglobin was 7.  Due to worsening kidney function, nephrology consulted and following.  Oncology is following for AML.

## 2023-05-23 NOTE — TOC Progression Note (Signed)
 Transition of Care [facility]) - Progression Note    Patient Details  Name: Robert [name] MRN: [id number] Date of Birth: September 11, 1945  [facility] CM/SW Contact  Cherre Blanc, RN Phone Number: 05/23/2023, 3:50 PM  Clinical Narrative:     Insurance offered a p2p. MD advised that the patient's kidney function is trending up. MD and TOC made the decision to hold off on p2p until the patient is medically clear. TOC will continue to follow.  Expected Discharge Plan: Long Term Nursing Home Barriers to Discharge: No Barriers Identified  Expected Discharge Plan and Services     Post Acute Care Choice: Long Term Acute Care (LTAC)                                         Social Determinants of Health (SDOH) Interventions SDOH Screenings   Food Insecurity: No Food Insecurity (05/16/2023)  Housing: Low Risk  (05/16/2023)  Transportation Needs: No Transportation Needs (05/16/2023)  Utilities: Not At Risk (05/16/2023)  Alcohol Screen: Low Risk  (10/29/2020)  Depression (PHQ2-9): Low Risk  (09/12/2021)  Financial Resource Strain: Low Risk  (12/15/2022)   Received from [facility]  Physical Activity: Inactive (10/29/2020)  Social Connections: Patient Declined (05/16/2023)  Stress: No Stress Concern Present (10/29/2020)  Tobacco Use: Low Risk  (05/16/2023)    Readmission Risk Interventions    04/04/2023   12:56 PM 02/07/2023   12:33 PM  Readmission Risk Prevention Plan  Transportation Screening Complete Complete  Medication Review (RN Care Manager) Complete Complete  PCP or Specialist appointment within 3-5 days of discharge Complete Complete  SW Recovery Care/Counseling Consult Complete Complete  Palliative Care Screening Not Applicable Not Applicable  Skilled Nursing Facility Complete Complete

## 2023-05-23 NOTE — Plan of Care (Signed)
  Problem: Health Behavior/Discharge Planning: Goal: Ability to manage health-related needs will improve Outcome: Progressing   Problem: Clinical Measurements: Goal: Diagnostic test results will improve Outcome: Progressing   Problem: Nutrition: Goal: Adequate nutrition will be maintained Outcome: Progressing   Problem: Safety: Goal: Ability to remain free from injury will improve Outcome: Progressing

## 2023-05-23 NOTE — Progress Notes (Signed)
 Progress Note   Patient: Robert [name] YQM:[id number] DOB: 1945-07-30 DOA: 05/16/2023     6 DOS: the patient was seen and examined on 05/23/2023   Brief hospital course: Patient  is a 78 y.o. 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  On presentation, hemoglobin was 7.  Due to worsening kidney function, nephrology consulted and following.  Oncology is following for AML.   Principal Problem:   AKI (acute kidney injury) (HCC) Active Problems:   Normocytic anemia   AML (acute myeloblastic leukemia) (HCC)   Chronic systolic CHF (congestive heart failure) (HCC)   Thrombocytopenia (HCC)   Type 2 diabetes mellitus with complication (HCC)   Essential hypertension   Assessment and Plan: Acute on CKD stage 3B: Metabolic acidosis. Hypomagnesemia. Mild hyponatremia. Baseline creatinine ranging between around 2.25 and which trended up in the range of 4.  Nephrology was consulted. Dr. Thedore Mins states that this decrease in renal function is likely due to ATN vs acute interstitial nephritis due to chemotherapy or antibiotics.  Function gradually improving, creatinine today 3.7.  Continue to follow.  Added sodium bicarb for mild metabolic acidosis.  Give additional dose of magnesium sulfate.   Anemia of Chronic disease and iron deficiency Received 3 units of PRBC, hemoglobin starting stabilizing.   AML  Thrombocytopenia secondary to AML. Patient has a history of recently diagnosed acute myeloid leukemia on Venetoclax and Decitabine. Oncology following him. Holding chemo for now. Continue to follow up outpatient.   Chronic systolic CHF Patient has a history of HFrEF with last EF of 45-50%.  He appears  euvolemic at this time. Hold home Lasix in the setting of AKI.  Patient is is still euvolemic.   Thrombocytopenia  Severe chronic thrombocytopenia in the setting of chemotherapy. Improving. Platelets are 83 this morning.   Type 2 diabetes mellitus with complication (HCC) Diet-controlled type 2 diabetes with last A1c of 5.0%.   Essential hypertension The patient is normotensive on Metoprolol succinate 50 mg daily. Continue to monitor.   Weakness/debility: PT/OT recommended rehab placement.  Patient currently living in a long-term care facility.  Overweight with BMI 28.31.       Subjective:  Patient feels well today, denies any short of breath or cough.  Physical Exam: Vitals:   05/22/23 1721 05/22/23 1957 05/23/23 0524 05/23/23 0915  BP: 128/80 133/81 129/79 137/85  Pulse: 91 87 88 96  Resp: 18 20 18 18   Temp: 97.8 F (36.6 C) 97.9 F (36.6 C) 97.6 F (36.4 C) (!) 97.5 F (36.4 C)  TempSrc:    Oral  SpO2: 99% 99% 98% 100%  Weight:      Height:       General exam: Appears calm and comfortable  Respiratory system: Clear to auscultation. Respiratory effort normal. Cardiovascular system: S1 & S2 heard, RRR. No JVD, murmurs, rubs, gallops or clicks. No pedal edema. Gastrointestinal system: Abdomen is nondistended, soft and nontender. No organomegaly or masses felt. Normal bowel sounds heard. Central nervous system: Alert and oriented. No focal neurological deficits. Extremities: Symmetric 5 x 5 power. Skin: No rashes, lesions or ulcers Psychiatry: Judgement and insight appear normal. Mood & affect appropriate.    Data Reviewed:  Lab results reviewed.  Family Communication: None  Disposition:  Status is: Inpatient Remains inpatient appropriate because: Severity of disease.     Time spent: 35 minutes  Author: Marrion Coy, MD 05/23/2023 3:23 PM  For on call review [email or web address].

## 2023-05-24 ENCOUNTER — Ambulatory Visit

## 2023-05-24 DIAGNOSIS — C9200 Acute myeloblastic leukemia, not having achieved remission: Secondary | ICD-10-CM | POA: Diagnosis not present

## 2023-05-24 DIAGNOSIS — E118 Type 2 diabetes mellitus with unspecified complications: Secondary | ICD-10-CM | POA: Diagnosis not present

## 2023-05-24 DIAGNOSIS — N179 Acute kidney failure, unspecified: Secondary | ICD-10-CM | POA: Diagnosis not present

## 2023-05-24 LAB — BASIC METABOLIC PANEL WITH GFR
Anion gap: 11 (ref 5–15)
BUN: 70 mg/dL — ABNORMAL HIGH (ref 8–23)
CO2: 22 mmol/L (ref 22–32)
Calcium: 8.3 mg/dL — ABNORMAL LOW (ref 8.9–10.3)
Chloride: 106 mmol/L (ref 98–111)
Creatinine, Ser: 3.5 mg/dL — ABNORMAL HIGH (ref 0.61–1.24)
GFR, Estimated: 17 mL/min — ABNORMAL LOW (ref >60)
Glucose, Bld: 104 mg/dL — ABNORMAL HIGH (ref 70–99)
Potassium: 4.5 mmol/L (ref 3.5–5.1)
Sodium: 139 mmol/L (ref 135–145)

## 2023-05-24 LAB — CBC
HCT: 25.3 % — ABNORMAL LOW (ref 39.0–52.0)
Hemoglobin: 8 g/dL — ABNORMAL LOW (ref 13.0–17.0)
MCH: 28.1 pg (ref 26.0–34.0)
MCHC: 31.6 g/dL (ref 30.0–36.0)
MCV: 88.8 fL (ref 80.0–100.0)
Platelets: 96 10*3/uL — ABNORMAL LOW (ref 150–400)
RBC: 2.85 MIL/uL — ABNORMAL LOW (ref 4.22–5.81)
RDW: 17 % — ABNORMAL HIGH (ref 11.5–15.5)
WBC: 8.4 10*3/uL (ref 4.0–10.5)
nRBC: 0 % (ref 0.0–0.2)

## 2023-05-24 LAB — PROTEIN / CREATININE RATIO, URINE
Creatinine, Urine: 26 mg/dL
Protein Creatinine Ratio: 2.35 mg/mg{creat} — ABNORMAL HIGH (ref 0.00–0.15)
Total Protein, Urine: 61 mg/dL

## 2023-05-24 LAB — PHOSPHORUS: Phosphorus: 5.1 mg/dL — ABNORMAL HIGH (ref 2.5–4.6)

## 2023-05-24 LAB — ALBUMIN: Albumin: 1.6 g/dL — ABNORMAL LOW (ref 3.5–5.0)

## 2023-05-24 LAB — MAGNESIUM: Magnesium: 2.2 mg/dL (ref 1.7–2.4)

## 2023-05-24 NOTE — Progress Notes (Signed)
 Occupational Therapy Treatment Patient Details Name: Robert [name] MRN: [id number] DOB: 09/25/1945 Today's Date: 05/24/2023   History of present illness Pt is a 78 y.o. 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 MD assessment includes: acute on chronic CKD, normocytic anemia, hypomagnesemia, hypocalcemia, and thrombocytopenia.   OT comments  Pt is seated in W/C on arrival and ready for assist to return to bed. PT/OT performed additional co-tx session using STEDY to return pt from W/C to bed. Mod A x2 for full upright stand on stedy then Mod/Max A x2 for stand to get off stedy and back onto bed. Small BM on chux pad in W/C, total assist for peri-care at bed level able to roll with SUP. Set up assist for oral care seated in bed with HOB elevated. Pt thankful to get up to his W/C today and is motivated to continue to get stronger. Pt returned to bed with all needs in place and will cont to require skilled acute OT services to maximize his safety and IND to return to PLOF.        If plan is discharge home, recommend the following:  A lot of help with bathing/dressing/bathroom;Assistance with cooking/housework;Assist for transportation;Help with stairs or ramp for entrance;Two people to help with walking and/or transfers   Equipment Recommendations  None recommended by OT    Recommendations for Other Services      Precautions / Restrictions Precautions Precautions: Fall Recall of Precautions/Restrictions: Intact Restrictions Weight Bearing Restrictions Per Provider Order: No       Mobility Bed Mobility Overal bed mobility: Needs Assistance Bed Mobility: Sit to Supine, Rolling Rolling:  Supervision, Used rails   Supine to sit: Supervision Sit to supine: Min assist   General bed mobility comments: supervision to roll in bed for peri-care and buttocks cream to be applied; Min A for BLE management to return to elevated bed    Transfers Overall transfer level: Needs assistance Equipment used: Ambulation equipment used Transfers: Sit to/from Stand Sit to Stand: Mod assist, +2 physical assistance, From elevated surface, Via lift equipment           General transfer comment: MOD A x2 for STS from W/C to stedy and Mod/Max A x2 from stedy to return to bed via weight shifting to get one butt pad removed at a time Transfer via Lift Equipment: Stedy   Balance Overall balance assessment: Needs assistance, History of Falls Sitting-balance support: Bilateral upper extremity supported Sitting balance-[name]: Good     Standing balance support: Bilateral upper extremity supported Standing balance-[name]: Poor Standing balance comment: via stedy, able to stand upright from W/C                           ADL either performed or assessed with clinical judgement   ADL Overall ADL's : Needs assistance/impaired     Grooming: Oral care;Bed level;Set up                       Toileting- Clothing Manipulation and Hygiene: Total assistance;Bed level Toileting - Clothing Manipulation Details (indicate cue type and reason): via rolling in bed  Extremity/Trunk Assessment              Occupational psychologist Communication: No apparent difficulties   Cognition Arousal: Alert Behavior During Therapy: WFL for tasks assessed/performed Cognition: No apparent impairments             OT - Cognition Comments: pleasant                 Following commands: Intact        Cueing      Exercises      Shoulder Instructions       General Comments fatigues easily     Pertinent Vitals/ Pain       Pain Assessment Pain Assessment: No/denies pain  Home Living                                          Prior Functioning/Environment              Frequency  Min 2X/week        Progress Toward Goals  OT Goals(current goals can now be found in the care plan section)  Progress towards OT goals: Progressing toward goals  Acute Rehab OT Goals Patient Stated Goal: improve strength to go to rehab OT Goal Formulation: With patient Time For Goal Achievement: 06/01/23 Potential to Achieve Goals: Fair  Plan      Co-evaluation    PT/OT/SLP Co-Evaluation/Treatment: Yes Reason for Co-Treatment: For patient/therapist safety;To address functional/ADL transfers PT goals addressed during session: Mobility/safety with mobility OT goals addressed during session: ADL's and self-care      AM-PAC OT "6 Clicks" Daily Activity     Outcome Measure   Help from another person eating meals?: None Help from another person taking care of personal grooming?: None Help from another person toileting, which includes using toliet, bedpan, or urinal?: A Lot Help from another person bathing (including washing, rinsing, drying)?: A Lot Help from another person to put on and taking off regular upper body clothing?: A Little Help from another person to put on and taking off regular lower body clothing?: A Lot 6 Click Score: 17    End of Session Equipment Utilized During Treatment:  (stedy)  OT Visit Diagnosis: Unsteadiness on feet (R26.81);Muscle weakness (generalized) (M62.81)   Activity Tolerance Patient tolerated treatment well   Patient Left in bed;with call bell/phone within reach;with bed alarm set   Nurse Communication Mobility status        Time: 8295-6213 OT Time Calculation (min): 18 min  Charges: OT General Charges $OT Visit: 1 Visit OT Treatments $Self Care/Home Management : 8-22 mins  Jourdan Maldonado, OTR/L  05/24/23,  2:57 PM   Rudolph Dobler E Syre Knerr 05/24/2023, 2:55 PM

## 2023-05-24 NOTE — Plan of Care (Signed)
  Problem: Health Behavior/Discharge Planning: Goal: Ability to manage health-related needs will improve Outcome: Progressing   Problem: Clinical Measurements: Goal: Diagnostic test results will improve Outcome: Progressing   Problem: Nutrition: Goal: Adequate nutrition will be maintained Outcome: Progressing   Problem: Elimination: Goal: Will not experience complications related to bowel motility Outcome: Progressing   

## 2023-05-24 NOTE — Progress Notes (Signed)
 Physical Therapy Treatment Patient Details Name: Robert [name] MRN: [id number] DOB: February 11, 1946 Today's Date: 05/24/2023   History of Present Illness Pt is a 78 y.o. 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 MD assessment includes: acute on chronic CKD, normocytic anemia, hypomagnesemia, hypocalcemia, and thrombocytopenia.    PT Comments  2nd session performed for pt tolerance and assisting for return WC->bed via STEDY. PT/OT co-treatment and then left at bedside for OT to continue treatment and ADLs. Pt able to tolerate >2hrs sitting in WC this date and was safe to transfer with +2 assist via STEDY. Recommend continue mobility efforts with RN staff and mobility specialists via lift equipment for toileting needs and OOB mobility. Will continue to progress.    If plan is discharge home, recommend the following: Two people to help with walking and/or transfers;A lot of help with bathing/dressing/bathroom;Assistance with cooking/housework;Assist for transportation   Can travel by private vehicle     No  Equipment Recommendations  Other (comment)    Recommendations for Other Services       Precautions / Restrictions Precautions Precautions: Fall Recall of Precautions/Restrictions: Intact Restrictions Weight Bearing Restrictions Per Provider Order: No     Mobility  Bed Mobility Overal bed mobility: Needs Assistance Bed Mobility: Sit to Supine     Supine to sit: Supervision Sit to supine: Contact guard assist   General bed mobility comments: able to get legs up in bed and reach up towards bed rail to adjust self in bed. Also able to roll towards side for hygiene    Transfers Overall  transfer level: Needs assistance Equipment used: Ambulation equipment used Transfers: Sit to/from Stand Sit to Stand: Mod assist, +2 physical assistance, From elevated surface, Via lift equipment           General transfer comment: Able to perform STS using STEDY and performed transfer from wheelchair to bed. Still requires heavy assist for foot placement, ant translation and enough clearance to move butt pads in place Transfer via Lift Equipment: Stedy  Ambulation/Gait               General Gait Details: Manufacturing engineer mobility: Yes Wheelchair propulsion: Both upper extremities Wheelchair parts: Supervision/cueing Distance: 5 Wheelchair Assistance Details (indicate cue type and reason): able to self propel and mobilize around room with set up and supervision given. Pt declines to go out into the hallway at this time   Tilt Bed    Modified Rankin (Stroke Patients Only)       Balance Overall balance assessment: Needs assistance, History of Falls Sitting-balance support: Bilateral upper extremity supported Sitting balance-[name]: Good     Standing balance support: Bilateral upper extremity supported Standing balance-[name]: Poor                              Communication Communication Communication: No apparent difficulties  Cognition Arousal: Alert Behavior During Therapy: WFL for tasks assessed/performed   PT - Cognitive impairments: No apparent impairments  PT - Cognition Comments: very pleasant and agreeable to session Following commands: Intact      Cueing Cueing Techniques: Verbal cues, Visual cues, Tactile cues  Exercises Other Exercises Other Exercises: able to stand several times due to soiled bed. Needs total assist for hygiene. Other Exercises: able to roll to R side for hygiene with OT. Left with OT at bedside to  complete ADLs    General Comments General comments (skin integrity, edema, etc.): fatigues easily      Pertinent Vitals/Pain Pain Assessment Pain Assessment: No/denies pain    Home Living                          Prior Function            PT Goals (current goals can now be found in the care plan section) Acute Rehab PT Goals Patient Stated Goal: To walk again PT Goal Formulation: With patient Time For Goal Achievement: 05/31/23 Potential to Achieve Goals: Fair Progress towards PT goals: Progressing toward goals    Frequency    Min 1X/week      PT Plan      Co-evaluation PT/OT/SLP Co-Evaluation/Treatment: Yes Reason for Co-Treatment: For patient/therapist safety;To address functional/ADL transfers PT goals addressed during session: Mobility/safety with mobility OT goals addressed during session: ADL's and self-care      AM-PAC PT "6 Clicks" Mobility   Outcome Measure  Help needed turning from your back to your side while in a flat bed without using bedrails?: A Little Help needed moving from lying on your back to sitting on the side of a flat bed without using bedrails?: A Little Help needed moving to and from a bed to a chair (including a wheelchair)?: Total Help needed standing up from a chair using your arms (e.g., wheelchair or bedside chair)?: A Lot Help needed to walk in hospital room?: Total Help needed climbing 3-5 steps with a railing? : Total 6 Click Score: 11    End of Session Equipment Utilized During Treatment: Gait belt Activity Tolerance: Patient tolerated treatment well Patient left: in bed;with call bell/phone within reach;with bed alarm set Nurse Communication: Mobility status PT Visit Diagnosis: Muscle weakness (generalized) (M62.81);Difficulty in walking, not elsewhere classified (R26.2);Unsteadiness on feet (R26.81)     Time: 8295-6213 PT Time Calculation (min) (ACUTE ONLY): 8 min  Charges:    $Therapeutic Activity:  8-22 mins PT General Charges $$ ACUTE PT VISIT: 1 Visit                     Elizabeth Palau, PT, DPT, GCS [phone]    Rickie Gange 05/24/2023, 2:54 PM

## 2023-05-24 NOTE — Progress Notes (Signed)
 [facility]  ROUNDING NOTE   Subjective:   Patient laying in bed  Alert and oriented Partially completed fruit tray for breakfast States he is not a eggs and bacon person Half consumed Ensure at bedside   Creatinine 3.50 Urine output 1.1 L recorded in previous 24 hours  Objective:  Vital signs in last 24 hours:  Temp:  [97.8 F (36.6 C)-98.4 F (36.9 C)] 98.2 F (36.8 C) (04/03 0758) Pulse Rate:  [79-95] 95 (04/03 0758) Resp:  [16-20] 16 (04/03 0758) BP: (128-154)/(87-95) 128/87 (04/03 0758) SpO2:  [98 %-100 %] 100 % (04/03 0758) Weight:  [99.8 kg] 99.8 kg (04/03 0500)  Weight change:  Filed Weights   05/16/23 2055 05/19/23 0500 05/24/23 0500  Weight: 95.9 kg 100 kg 99.8 kg    Intake/Output: I/O last 3 completed shifts: In: 0  Out: 2100 [Urine:2100]   Intake/Output this shift:  Total I/O In: -  Out: 200 [Urine:200]  Physical Exam: General: NAD  Head: Normocephalic, atraumatic. Moist oral mucosal membranes  Eyes: Anicteric  Lungs:  Clear to auscultation, normal effort  Heart: Regular rate and rhythm  Abdomen:  Soft, tenderness,   Extremities: Trace peripheral edema.  Neurologic: Alert and oriented, moving all four extremities  Skin: No lesions       Basic Metabolic Panel: Recent Labs  Lab 05/19/23 0409 05/20/23 0356 05/21/23 0826 05/22/23 0518 05/23/23 0347 05/24/23 0404  NA 134* 135  --  138 135 139  K 4.0 4.1  --  4.4 4.5 4.5  CL 105 105  --  108 104 106  CO2 20* 20*  --  20* 21* 22  GLUCOSE 88 133*  --  86 100* 104*  BUN 67* 67*  --  70* 71* 70*  CREATININE 4.34* 4.34*  --  3.90* 3.70* 3.50*  CALCIUM 6.9* 7.2*  --  7.5* 7.6* 8.3*  MG 1.4*  --  1.5*  --  1.4* 2.2  PHOS  --   --   --   --   --  5.1*    Liver Function Tests: Recent Labs  Lab 05/19/23 0409 05/23/23 0347  AST 14*  --   ALT 9  --   ALKPHOS 69  --   BILITOT 0.7  --   PROT 5.9*  --   ALBUMIN <1.5* <1.5*   No results for input(s): "LIPASE", "AMYLASE" in  the last 168 hours. No results for input(s): "AMMONIA" in the last 168 hours.  CBC: Recent Labs  Lab 05/19/23 0409 05/20/23 0356 05/21/23 0832 05/22/23 0518 05/24/23 0404  WBC 11.7* 11.7* 9.4 10.0 8.4  NEUTROABS 6.1  --   --   --   --   HGB 6.8* 8.2* 7.8* 8.5* 8.0*  HCT 21.4* 24.7* 24.2* 27.7* 25.3*  MCV 88.8 86.1 88.3 90.5 88.8  PLT 58* 70* 83* 98* 96*    Cardiac Enzymes: No results for input(s): "CKTOTAL", "CKMB", "CKMBINDEX", "TROPONINI" in the last 168 hours.   BNP: Invalid input(s): "POCBNP"  CBG: No results for input(s): "GLUCAP" in the last 168 hours.  Microbiology: Results for orders placed or performed during the hospital encounter of 05/16/23  MRSA Next Gen by PCR, Nasal     Status: None   Collection Time: 05/16/23  9:55 PM   Specimen: Nasal Mucosa; Nasal Swab  Result Value Ref Range Status   MRSA by PCR Next Gen NOT DETECTED NOT DETECTED Final    Comment: (NOTE) The GeneXpert MRSA Assay (FDA approved for NASAL specimens  only), is one component of a comprehensive MRSA colonization surveillance program. It is not intended to diagnose MRSA infection nor to guide or monitor treatment for MRSA infections. Test performance is not FDA approved in patients less than 70 years old. Performed at [facility], [address] [place], [place] [address]     Coagulation Studies: No results for input(s): "LABPROT", "INR" in the last 72 hours.  Urinalysis: No results for input(s): "COLORURINE", "LABSPEC", "PHURINE", "GLUCOSEU", "HGBUR", "BILIRUBINUR", "KETONESUR", "PROTEINUR", "UROBILINOGEN", "NITRITE", "LEUKOCYTESUR" in the last 72 hours.  Invalid input(s): "APPERANCEUR"    Imaging: No results found.   Medications:    albumin human 12.5 g (05/24/23 0914)    DULoxetine  60 mg Oral BID   feeding supplement  237 mL Oral BID BM   ferrous sulfate  325 mg Oral Q breakfast   folic acid  1 mg Oral Daily   latanoprost  1 drop Both Eyes QHS   melatonin   5 mg Oral QHS   metoprolol succinate  50 mg Oral Daily   mirtazapine  15 mg Oral QHS   oxybutynin  10 mg Oral QPM   pantoprazole  40 mg Oral Daily   polyethylene glycol  17 g Oral Daily   senna-docusate  1 tablet Oral BID   simvastatin  20 mg Oral Daily   sodium bicarbonate  650 mg Oral BID   sodium chloride flush  3 mL Intravenous Q12H   acetaminophen **OR** acetaminophen, loperamide, ondansetron **OR** ondansetron (ZOFRAN) IV, oxyCODONE-acetaminophen  Assessment/ Plan:  [name] is a 78 y.o.  male with medical problems of acute myeloid leukoma, underlying chemotherapy, history of sigmoid colon cancer status post partial colectomy in November 2024, chronic kidney disease, anemia, diabetes, peripheral arterial disease, chronic heart failure with reduced ejection fraction with a EF of 40%, history of pericardial effusion status post pericardial window. was admitted on 05/16/2023 for AKI (acute kidney injury) (HCC) [N17.9] Symptomatic anemia [D64.9]   Acute Kidney Injury on chronic kidney disease stage 4 with baseline creatinine 2.25 and GFR of 29 on 05/09/2023.  Proteinuria.   Pyuria Previous CT in February 2025 showed severely atrophic left kidney. Urinalysis from 05/16/2023 shows moderate hemoglobin, large leukocytes, proteinuria, 11-20 RBCs and greater than 50 WBCs. Differential includes ATN versus acute interstitial nephritis from chemo versus various antibiotics.  Creatinine making slow progress but improving.  Urine output acceptable with Albumin. Continue to avoid nephrotoxic agents and therapies.  No acute indication for dialysis. Patient will need to follow up with our office at discharge.   Lab Results  Component Value Date   CREATININE 3.50 (H) 05/24/2023   CREATININE 3.70 (H) 05/23/2023   CREATININE 3.90 (H) 05/22/2023    Intake/Output Summary (Last 24 hours) at 05/24/2023 1040 Last data filed at 05/24/2023 0925 Gross per 24 hour  Intake 0 ml  Output 1300 ml   Net -1300 ml   2. Anemia of chronic kidney disease with thrombocytopenia Lab Results  Component Value Date   HGB 8.0 (L) 05/24/2023    Patient has received blood transfusions during this admission.  Hgb remains decreased. History of colon cancer, will avoid ESA.   3.  Chronic systolic heart failure echo completed on 04/04/2023 shows EF 45 to 50%.  Furosemide held. He has generalized edema due to third spacing of fluid.  Albumin is extremely low at less than 1.5 on 05/19/2023.  IV albumin ordered as above.  Awaiting updated level.    LOS: 7 Lajoyce Tamura  4/3/202510:40 AM

## 2023-05-24 NOTE — Progress Notes (Signed)
 Physical Therapy Treatment Patient Details Name: Robert [name] MRN: [id number] DOB: 07-27-1945 Today's Date: 05/24/2023   History of Present Illness Pt is a 78 y.o. 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 MD assessment includes: acute on chronic CKD, normocytic anemia, hypomagnesemia, hypocalcemia, and thrombocytopenia.    PT Comments  Pt is making gradual progress towards goals- agreeable to session and able to transition bed->WC with +2 and STEDY lift. Pt able to perform several sit<>Stands needed mod A +2 and progressing to total assist +2 with fatigue. Able to demonstrate 4/5 strength in B LE in sitting, but noted functional weakness. Pt wanting to sit in [facility] at this time-left with call bell in reach. Of note, pt noted to be in soiled bed with purewick disconnected, needed total assist for bed change and hygiene. Will continue to progress.    If plan is discharge home, recommend the following: Two people to help with walking and/or transfers;A lot of help with bathing/dressing/bathroom;Assistance with cooking/housework;Assist for transportation   Can travel by private vehicle     No  Equipment Recommendations   (TBD)    Recommendations for Other Services       Precautions / Restrictions Precautions Precautions: Fall Recall of Precautions/Restrictions: Intact Restrictions Weight Bearing Restrictions Per Provider Order: No     Mobility  Bed Mobility Overal bed mobility: Needs Assistance Bed Mobility: Supine to Sit, Sit to Supine     Supine to sit: Supervision     General bed mobility comments: improved technique this date with great initiation with B LE. Once seated at EOB, does  struggle with scooting out towards EOB. UPright posture noted    Transfers Overall transfer level: Needs assistance Equipment used: Ambulation equipment used Transfers: Sit to/from Stand Sit to Stand: Mod assist, +2 physical assistance, From elevated surface, Via lift equipment           General transfer comment: Several sit<>Stands with use of STEDY machine and +2 assist. Good initial effort, however does fatigue quickly with standing endurance only able to last <80minute per stand. Pt then able to perform transition to personal [facility] Transfer via Lift Equipment: Stedy  Ambulation/Gait               General Gait Details: Manufacturing engineer mobility: Yes Wheelchair propulsion: Both upper extremities Wheelchair parts: Supervision/cueing Distance: 5 Wheelchair Assistance Details (indicate cue type and reason): able to self propel and mobilize around room with set up and supervision given. Pt declines to go out into the hallway at this time   Tilt Bed    Modified Rankin (Stroke Patients Only)       Balance Overall balance assessment: Needs assistance, History of Falls Sitting-balance support: Bilateral upper extremity supported Sitting balance-[name]: Good     Standing balance support: Bilateral upper extremity supported Standing balance-[name]: Poor                              Communication Communication Communication: No apparent difficulties  Cognition Arousal: Alert Behavior During Therapy: WFL for tasks assessed/performed   PT -  Cognitive impairments: No apparent impairments                       PT - Cognition Comments: very pleasant and agreeable to session Following commands: Intact      Cueing Cueing Techniques: Verbal cues, Visual cues, Tactile cues  Exercises Other Exercises Other Exercises: able to stand several times due to soiled bed. Needs  total assist for hygiene.    General Comments        Pertinent Vitals/Pain Pain Assessment Pain Assessment: No/denies pain    Home Living                          Prior Function            PT Goals (current goals can now be found in the care plan section) Acute Rehab PT Goals Patient Stated Goal: To walk again PT Goal Formulation: With patient Time For Goal Achievement: 05/31/23 Potential to Achieve Goals: Fair Progress towards PT goals: Progressing toward goals    Frequency    Min 1X/week      PT Plan      Co-evaluation PT/OT/SLP Co-Evaluation/Treatment: Yes Reason for Co-Treatment: For patient/therapist safety;To address functional/ADL transfers PT goals addressed during session: Mobility/safety with mobility OT goals addressed during session: ADL's and self-care      AM-PAC PT "6 Clicks" Mobility   Outcome Measure  Help needed turning from your back to your side while in a flat bed without using bedrails?: A Little Help needed moving from lying on your back to sitting on the side of a flat bed without using bedrails?: A Little Help needed moving to and from a bed to a chair (including a wheelchair)?: Total Help needed standing up from a chair using your arms (e.g., wheelchair or bedside chair)?: A Lot Help needed to walk in hospital room?: Total Help needed climbing 3-5 steps with a railing? : Total 6 Click Score: 11    End of Session Equipment Utilized During Treatment: Gait belt Activity Tolerance: Patient tolerated treatment well Patient left:  (in personal WC- RN notified) Nurse Communication: Mobility status PT Visit Diagnosis: Muscle weakness (generalized) (M62.81);Difficulty in walking, not elsewhere classified (R26.2);Unsteadiness on feet (R26.81)     Time: 1110-1144 PT Time Calculation (min) (ACUTE ONLY): 34 min  Charges:    $Therapeutic Activity: 8-22 mins PT General Charges $$ ACUTE PT VISIT: 1 Visit                      Elizabeth Palau, PT, DPT, GCS [phone]    Robert [name] 05/24/2023, 12:40 PM

## 2023-05-24 NOTE — Progress Notes (Signed)
 Occupational Therapy Treatment Patient Details Name: Robert [name] MRN: [id number] DOB: 07-May-1945 Today's Date: 05/24/2023   History of present illness Pt is a 78 y.o. 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 MD assessment includes: acute on chronic CKD, normocytic anemia, hypomagnesemia, hypocalcemia, and thrombocytopenia.   OT comments  Pt is supine in bed on arrival. Pleasant and agreeable to PT/OT co-treat session. He denies pain. Pt performed bed mobility with SUP today to reach EOB with difficulty forward scooting. Full linen change required d/t incontinence. Multiple STS trials requiring bed height elevated and use of stedy with Mod A x2 for short periods to perform peri-care and don new gown. Increased fatigue with final STS to transition to W/C with Total assist x2. W/C mobility performed in room to turn around with MOD I. Left seated with call bell in reach and therapy will assist with return to bed this afternoon. He will cont to require skilled acute OT services to maximize his safety and IND to return to PLOF.        If plan is discharge home, recommend the following:  A lot of help with bathing/dressing/bathroom;Assistance with cooking/housework;Assist for transportation;Help with stairs or ramp for entrance;Two people to help with walking and/or transfers   Equipment Recommendations  None recommended by OT    Recommendations for Other Services      Precautions / Restrictions Precautions Precautions: Fall Restrictions Weight Bearing Restrictions Per Provider Order: No       Mobility Bed Mobility Overal bed mobility: Needs Assistance Bed Mobility: Supine to Sit     Supine  to sit: Supervision     General bed mobility comments: difficulty with forward scoot to EOB, but able to perform supine to sit without physical assist    Transfers Overall transfer level: Needs assistance Equipment used: Ambulation equipment used Transfers: Sit to/from Stand Sit to Stand: Mod assist, +2 physical assistance, From elevated surface, Via lift equipment           General transfer comment: multiple STS trials from EOB and from stedy requiring +2 assist; final stand from stedy with total a x2 to sit in W/C; minimal standing tolerance Transfer via Lift Equipment: Stedy   Balance Overall balance assessment: Needs assistance, History of Falls Sitting-balance support: Bilateral upper extremity supported Sitting balance-[name]: Good     Standing balance support: Bilateral upper extremity supported Standing balance-[name]: Poor                             ADL either performed or assessed with clinical judgement   ADL Overall ADL's : Needs assistance/impaired                             Toileting- Clothing Manipulation and Hygiene: Total assistance;Sit to/from stand Toileting - Clothing Manipulation Details (indicate cue type and reason): standing at stedy with Mod A            Extremity/Trunk Assessment              Vision       Perception     Praxis     Communication Communication Communication:  No apparent difficulties   Cognition Arousal: Alert Behavior During Therapy: WFL for tasks assessed/performed Cognition: No apparent impairments             OT - Cognition Comments: pleasant                 Following commands: Intact        Cueing      Exercises      Shoulder Instructions       General Comments fatigues easily with minimal lightheadedness with final stand from stedy; improved once seated in W/C    Pertinent Vitals/ Pain       Pain Assessment Pain Assessment: No/denies  pain  Home Living                                          Prior Functioning/Environment              Frequency  Min 2X/week        Progress Toward Goals  OT Goals(current goals can now be found in the care plan section)  Progress towards OT goals: Progressing toward goals  Acute Rehab OT Goals Patient Stated Goal: improve strength OT Goal Formulation: With patient Time For Goal Achievement: 06/01/23 Potential to Achieve Goals: Fair  Plan      Co-evaluation    PT/OT/SLP Co-Evaluation/Treatment: Yes Reason for Co-Treatment: For patient/therapist safety;To address functional/ADL transfers PT goals addressed during session: Mobility/safety with mobility OT goals addressed during session: ADL's and self-care      AM-PAC OT "6 Clicks" Daily Activity     Outcome Measure   Help from another person eating meals?: None Help from another person taking care of personal grooming?: None Help from another person toileting, which includes using toliet, bedpan, or urinal?: A Lot Help from another person bathing (including washing, rinsing, drying)?: A Lot Help from another person to put on and taking off regular upper body clothing?: A Little Help from another person to put on and taking off regular lower body clothing?: A Lot 6 Click Score: 17    End of Session Equipment Utilized During Treatment:  (stedy)  OT Visit Diagnosis: Unsteadiness on feet (R26.81);Muscle weakness (generalized) (M62.81)   Activity Tolerance Patient tolerated treatment well   Patient Left in chair;with call bell/phone within reach   Nurse Communication Mobility status        Time: 1110-1143 OT Time Calculation (min): 33 min  Charges: OT General Charges $OT Visit: 1 Visit OT Treatments $Self Care/Home Management : 8-22 mins  Robert [name], OTR/L  05/24/23, 12:48 PM   Robert [name] Robert [name] 05/24/2023, 12:43 PM

## 2023-05-24 NOTE — Progress Notes (Signed)
 Progress Note   Patient: Robert [name] ZOX:[id number] DOB: [birth year] DOA: 05/16/2023     7 DOS: the patient was seen and examined on 05/24/2023   Brief hospital course: Patient  is a 78 y.o. 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  On presentation, hemoglobin was 7.  Due to worsening kidney function, nephrology consulted and following.  Oncology is following for AML.   Principal Problem:   AKI (acute kidney injury) (HCC) Active Problems:   Normocytic anemia   AML (acute myeloblastic leukemia) (HCC)   Chronic systolic CHF (congestive heart failure) (HCC)   Thrombocytopenia (HCC)   Type 2 diabetes mellitus with complication (HCC)   Essential hypertension   Assessment and Plan: Acute on CKD stage 3B: Metabolic acidosis. Hypomagnesemia. Mild hyponatremia. Baseline creatinine ranging between around 2.25 and which trended up in the range of 4.  Nephrology was consulted. Dr. Thedore Mins states that this decrease in renal function is likely due to ATN vs acute interstitial nephritis due to chemotherapy or antibiotics.  4/2. Renal function gradually improving, creatinine today 3.7.  Continue to follow.  Added sodium bicarb for mild metabolic acidosis.  Give additional dose of magnesium sulfate.  Renal function continue to improve, magnesium level normalized.  Patient may be able to discharge tomorrow.   Anemia of Chronic disease and iron deficiency Received 3 units of PRBC, hemoglobin starting stabilizing.   AML  Thrombocytopenia secondary to AML. Patient has a history of recently diagnosed acute myeloid leukemia on Venetoclax and Decitabine. Oncology following him. Holding chemo for now. Continue to  follow up outpatient.   Chronic systolic CHF Patient has a history of HFrEF with last EF of 45-50%.  He appears euvolemic at this time. Hold home Lasix in the setting of AKI.  Patient is is still euvolemic.   Thrombocytopenia  Severe chronic thrombocytopenia in the setting of chemotherapy. Improving. Platelets are 83 this morning.   Type 2 diabetes mellitus with complication (HCC) Diet-controlled type 2 diabetes with last A1c of 5.0%.   Essential hypertension The patient is normotensive on Metoprolol succinate 50 mg daily. Continue to monitor.   Weakness/debility: PT/OT recommended rehab placement.  Patient currently living in a long-term care facility.   Overweight with BMI 28.31.        Subjective:  Patient doing much better today, denies any short of breath.  Physical Exam: Vitals:   05/23/23 2049 05/24/23 0432 05/24/23 0500 05/24/23 0758  BP: (!) 149/95 (!) 154/93  128/87  Pulse: 83 84  95  Resp: 20 18  16   Temp: 98 F (36.7 C) 97.8 F (36.6 C)  98.2 F (36.8 C)  TempSrc:  Oral    SpO2: 99% 98%  100%  Weight:   99.8 kg   Height:       General exam: Appears calm and comfortable  Respiratory system: Clear to auscultation. Respiratory effort normal. Cardiovascular system: S1 & S2 heard, RRR. No JVD, murmurs, rubs, gallops or clicks. No pedal edema. Gastrointestinal system: Abdomen is nondistended, soft and nontender. No organomegaly or masses felt. Normal bowel sounds heard. Central nervous system: Alert and oriented x2.  No focal neurological deficits. Extremities: Symmetric 5 x 5 power. Skin: No rashes, lesions or ulcers Psychiatry: Judgement and insight  appear normal. Mood & affect appropriate.    Data Reviewed:  Lab results reviewed.  Family Communication: Called wife to use the number listed in the chart, not able to reach, no answer machine.  Disposition: Status is: Inpatient Remains inpatient appropriate because: Severity of disease.     Time  spent: 35 minutes  Author: Marrion Coy, MD 05/24/2023 11:34 AM  For on call review [email or web address].

## 2023-05-25 ENCOUNTER — Encounter: Payer: Self-pay | Admitting: Oncology

## 2023-05-25 DIAGNOSIS — N179 Acute kidney failure, unspecified: Secondary | ICD-10-CM | POA: Diagnosis not present

## 2023-05-25 DIAGNOSIS — C9200 Acute myeloblastic leukemia, not having achieved remission: Secondary | ICD-10-CM | POA: Diagnosis not present

## 2023-05-25 DIAGNOSIS — I5022 Chronic systolic (congestive) heart failure: Secondary | ICD-10-CM | POA: Diagnosis not present

## 2023-05-25 LAB — BASIC METABOLIC PANEL WITH GFR
Anion gap: 8 (ref 5–15)
BUN: 66 mg/dL — ABNORMAL HIGH (ref 8–23)
CO2: 25 mmol/L (ref 22–32)
Calcium: 8.3 mg/dL — ABNORMAL LOW (ref 8.9–10.3)
Chloride: 106 mmol/L (ref 98–111)
Creatinine, Ser: 3.47 mg/dL — ABNORMAL HIGH (ref 0.61–1.24)
GFR, Estimated: 17 mL/min — ABNORMAL LOW (ref >60)
Glucose, Bld: 86 mg/dL (ref 70–99)
Potassium: 4.7 mmol/L (ref 3.5–5.1)
Sodium: 139 mmol/L (ref 135–145)

## 2023-05-25 LAB — MAGNESIUM: Magnesium: 1.9 mg/dL (ref 1.7–2.4)

## 2023-05-25 NOTE — Progress Notes (Signed)
 Patient report given to LPN [facility]. All questions are answered via phone. No any questions at this time.

## 2023-05-25 NOTE — Progress Notes (Signed)
 Patient is alert and oriented X 4. He has been getting a  albumin human.his I/v site is infiltrated and looks red and swollen. Sima from the pharmacy made aware. No any new order received. I/v removed. Keep his left arm elevated with pillow. Plan of care ongoing.

## 2023-05-25 NOTE — Progress Notes (Signed)
 [facility]  ROUNDING NOTE   Subjective:   Patient laying in bed bed Nursing at bedside with am meds States he feels well Denies pain Room air  Creatinine 3.47   Objective:  Vital signs in last 24 hours:  Temp:  [97.7 F (36.5 C)-98.7 F (37.1 C)] 98.5 F (36.9 C) (04/04 0754) Pulse Rate:  [85-91] 91 (04/04 0754) Resp:  [16-18] 16 (04/04 0754) BP: (110-135)/(69-88) 134/80 (04/04 0754) SpO2:  [99 %-100 %] 100 % (04/04 0754)  Weight change:  Filed Weights   05/16/23 2055 05/19/23 0500 05/24/23 0500  Weight: 95.9 kg 100 kg 99.8 kg    Intake/Output: I/O last 3 completed shifts: In: 50 [IV Piggyback:50] Out: 950 [Urine:950]   Intake/Output this shift:  No intake/output data recorded.  Physical Exam: General: NAD  Head: Normocephalic, atraumatic. Moist oral mucosal membranes  Eyes: Anicteric  Lungs:  Clear to auscultation, normal effort  Heart: Regular rate and rhythm  Abdomen:  Soft, tenderness,   Extremities: No peripheral edema.  Neurologic: Alert and oriented, moving all four extremities  Skin: No lesions       Basic Metabolic Panel: Recent Labs  Lab 05/19/23 0409 05/20/23 0356 05/21/23 0826 05/22/23 0518 05/23/23 0347 05/24/23 0404 05/25/23 0806  NA 134* 135  --  138 135 139 139  K 4.0 4.1  --  4.4 4.5 4.5 4.7  CL 105 105  --  108 104 106 106  CO2 20* 20*  --  20* 21* 22 25  GLUCOSE 88 133*  --  86 100* 104* 86  BUN 67* 67*  --  70* 71* 70* 66*  CREATININE 4.34* 4.34*  --  3.90* 3.70* 3.50* 3.47*  CALCIUM 6.9* 7.2*  --  7.5* 7.6* 8.3* 8.3*  MG 1.4*  --  1.5*  --  1.4* 2.2 1.9  PHOS  --   --   --   --   --  5.1*  --     Liver Function Tests: Recent Labs  Lab 05/19/23 0409 05/23/23 0347 05/24/23 0404  AST 14*  --   --   ALT 9  --   --   ALKPHOS 69  --   --   BILITOT 0.7  --   --   PROT 5.9*  --   --   ALBUMIN <1.5* <1.5* 1.6*   No results for input(s): "LIPASE", "AMYLASE" in the last 168 hours. No results for input(s):  "AMMONIA" in the last 168 hours.  CBC: Recent Labs  Lab 05/19/23 0409 05/20/23 0356 05/21/23 0832 05/22/23 0518 05/24/23 0404  WBC 11.7* 11.7* 9.4 10.0 8.4  NEUTROABS 6.1  --   --   --   --   HGB 6.8* 8.2* 7.8* 8.5* 8.0*  HCT 21.4* 24.7* 24.2* 27.7* 25.3*  MCV 88.8 86.1 88.3 90.5 88.8  PLT 58* 70* 83* 98* 96*    Cardiac Enzymes: No results for input(s): "CKTOTAL", "CKMB", "CKMBINDEX", "TROPONINI" in the last 168 hours.   BNP: Invalid input(s): "POCBNP"  CBG: No results for input(s): "GLUCAP" in the last 168 hours.  Microbiology: Results for orders placed or performed during the hospital encounter of 05/16/23  MRSA Next Gen by PCR, Nasal     Status: None   Collection Time: 05/16/23  9:55 PM   Specimen: Nasal Mucosa; Nasal Swab  Result Value Ref Range Status   MRSA by PCR Next Gen NOT DETECTED NOT DETECTED Final    Comment: (NOTE) The GeneXpert MRSA Assay (FDA approved  for NASAL specimens only), is one component of a comprehensive MRSA colonization surveillance program. It is not intended to diagnose MRSA infection nor to guide or monitor treatment for MRSA infections. Test performance is not FDA approved in patients less than 63 years old. Performed at [facility], [address] [place], [place] [address]     Coagulation Studies: No results for input(s): "LABPROT", "INR" in the last 72 hours.  Urinalysis: No results for input(s): "COLORURINE", "LABSPEC", "PHURINE", "GLUCOSEU", "HGBUR", "BILIRUBINUR", "KETONESUR", "PROTEINUR", "UROBILINOGEN", "NITRITE", "LEUKOCYTESUR" in the last 72 hours.  Invalid input(s): "APPERANCEUR"    Imaging: No results found.   Medications:    albumin human 12.5 g (05/25/23 0907)    DULoxetine  60 mg Oral BID   feeding supplement  237 mL Oral BID BM   ferrous sulfate  325 mg Oral Q breakfast   folic acid  1 mg Oral Daily   latanoprost  1 drop Both Eyes QHS   melatonin  5 mg Oral QHS   metoprolol succinate  50 mg  Oral Daily   mirtazapine  15 mg Oral QHS   oxybutynin  10 mg Oral QPM   pantoprazole  40 mg Oral Daily   polyethylene glycol  17 g Oral Daily   senna-docusate  1 tablet Oral BID   simvastatin  20 mg Oral Daily   sodium bicarbonate  650 mg Oral BID   sodium chloride flush  3 mL Intravenous Q12H   acetaminophen **OR** acetaminophen, loperamide, ondansetron **OR** ondansetron (ZOFRAN) IV, oxyCODONE-acetaminophen  Assessment/ Plan:  Robert [name] is a 78 y.o.  male with medical problems of acute myeloid leukoma, underlying chemotherapy, history of sigmoid colon cancer status post partial colectomy in November 2024, chronic kidney disease, anemia, diabetes, peripheral arterial disease, chronic heart failure with reduced ejection fraction with a EF of 40%, history of pericardial effusion status post pericardial window. was admitted on 05/16/2023 for AKI (acute kidney injury) (HCC) [N17.9] Symptomatic anemia [D64.9]   Acute Kidney Injury on chronic kidney disease stage 4 with baseline creatinine 2.25 and GFR of 29 on 05/09/2023.  Proteinuria.   Pyuria Previous CT in February 2025 showed severely atrophic left kidney. Urinalysis from 05/16/2023 shows moderate hemoglobin, large leukocytes, proteinuria, 11-20 RBCs and greater than 50 WBCs. Differential includes ATN versus acute interstitial nephritis from chemo versus various antibiotics.  Creatinine stable today. May represent new baseline. No acute need for dialysis.  Patient will need to follow up with our office at discharge.   Lab Results  Component Value Date   CREATININE 3.47 (H) 05/25/2023   CREATININE 3.50 (H) 05/24/2023   CREATININE 3.70 (H) 05/23/2023    Intake/Output Summary (Last 24 hours) at 05/25/2023 1052 Last data filed at 05/24/2023 1805 Gross per 24 hour  Intake 50.03 ml  Output 350 ml  Net -299.97 ml   2. Anemia of chronic kidney disease with thrombocytopenia Lab Results  Component Value Date   HGB 8.0 (L)  05/24/2023    Patient has received blood transfusions during this admission.  Hgb slowly decreasing. History of colon cancer, will avoid ESA.   3.  Chronic systolic heart failure echo completed on 04/04/2023 shows EF 45 to 50%.  Furosemide held. He has generalized edema due to third spacing of fluid.  Albumin is extremely low at less than 1.5 on 05/19/2023.  IV albumin ordered, level improving.     LOS: 8 Varina Hulon 4/4/202510:52 AM

## 2023-05-25 NOTE — TOC Transition Note (Addendum)
 Transition of Care [facility]) - Discharge Note   Patient Details  Name: Robert [name] MRN: [id number] Date of Birth: [birth year]  Transition of Care [facility]) CM/SW Contact:  Cherre Blanc, RN Phone Number: 05/25/2023, 11:23 AM   Clinical Narrative:    Patient is medically clear for discharge back to [facility] for LTC. TOC spoke with the patient's wife Graciella Belton [phone]. TOC called several times with no answer and no option to leave a voicemail. Transportation to the facility arranged via [employer]     Barriers to Discharge: No Barriers Identified   Patient Goals and CMS Choice            Discharge Placement                       Discharge Plan and Services Additional resources added to the After Visit Summary for       Post Acute Care Choice: Long Term Acute Care (LTAC)                               Social Drivers of Health (SDOH) Interventions SDOH Screenings   Food Insecurity: No Food Insecurity (05/16/2023)  Housing: Low Risk  (05/16/2023)  Transportation Needs: No Transportation Needs (05/16/2023)  Utilities: Not At Risk (05/16/2023)  Alcohol Screen: Low Risk  (10/29/2020)  Depression (PHQ2-9): Low Risk  (09/12/2021)  Financial Resource Strain: Low Risk  (12/15/2022)   Received from [facility]  Physical Activity: Inactive (10/29/2020)  Social Connections: Patient Declined (05/16/2023)  Stress: No Stress Concern Present (10/29/2020)  Tobacco Use: Low Risk  (05/16/2023)     Readmission Risk Interventions    04/04/2023   12:56 PM 02/07/2023   12:33 PM  Readmission Risk Prevention Plan  Transportation Screening Complete Complete  Medication Review (RN Care Manager) Complete Complete  PCP or Specialist appointment within 3-5 days of discharge Complete Complete  SW Recovery Care/Counseling Consult Complete Complete  Palliative Care Screening Not Applicable Not Applicable  Skilled Nursing Facility Complete Complete

## 2023-05-25 NOTE — Discharge Summary (Signed)
 Physician Discharge Summary   Patient: Robert [name] MRN: [id number] DOB: February 25, 1945  Admit date:     05/16/2023  Discharge date: 05/25/23  Discharge Physician: Marrion Coy   PCP: Corky Downs, MD   Recommendations at discharge:   Follow-up with PCP in 1 week. Follow-up with oncology in 1 to 2 weeks. Follow-up with nephrology in 1-2 weeks.  Discharge Diagnoses: Principal Problem:   AKI (acute kidney injury) (HCC) Active Problems:   Normocytic anemia   AML (acute myeloblastic leukemia) (HCC)   Chronic systolic CHF (congestive heart failure) (HCC)   Thrombocytopenia (HCC)   Type 2 diabetes mellitus with complication (HCC)   Essential hypertension  Resolved Problems:   * No resolved hospital problems. [facility] Course: Patient  is a 78 y.o. 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  On presentation, hemoglobin was 7.  Due to worsening kidney function, nephrology consulted and following.  Oncology is following for AML.  Assessment and Plan:  Acute on CKD stage 3B: Metabolic acidosis. Hypomagnesemia. Mild hyponatremia. Baseline creatinine ranging between around 2.25 and which trended up in the range of 4.  Nephrology was consulted. Dr. Thedore Mins states that this decrease in renal function is likely due to ATN vs acute interstitial nephritis due to chemotherapy or antibiotics.  4/2. Renal function gradually improving, creatinine today 3.7.  Continue to follow.  Added sodium bicarb for mild metabolic acidosis.  Give additional dose of magnesium sulfate.   Renal function continue to improve, magnesium level normalized.   She will be followed by nephrology as outpatient.   Anemia of  Chronic disease and iron deficiency Received 3 units of PRBC, hemoglobin starting stabilizing.  Continue iron treatment.   AML  Thrombocytopenia secondary to AML. Patient has a history of recently diagnosed acute myeloid leukemia on Venetoclax and Decitabine. Oncology following him. Holding chemo for now. Continue to follow up outpatient.   Chronic systolic CHF Patient has a history of HFrEF with last EF of 45-50%.  He appears euvolemic at this time. Hold home Lasix in the setting of AKI.  Patient is is still euvolemic.   Thrombocytopenia  Severe chronic thrombocytopenia in the setting of chemotherapy. Improving. Platelets are 83 this morning.   Type 2 diabetes mellitus with complication (HCC) Diet-controlled type 2 diabetes with last A1c of 5.0%.   Essential hypertension Resume treatment.   Weakness/debility: PT/OT recommended rehab placement, she discharged, and did not approval.  Patient will go back to long-term care.  Overweight with BMI 28.31.          Consultants: Nephrology, oncology, Procedures performed: None  Disposition: Nursing home Diet recommendation:  Discharge Diet Orders (From admission, onward)     Start     Ordered   05/25/23 0000  Diet - low sodium heart healthy        05/25/23 1149           Cardiac diet DISCHARGE MEDICATION: Allergies as of 05/25/2023   No Known Allergies      Medication List     STOP taking these medications    bisacodyl 10 MG suppository Commonly known as: DULCOLAX   furosemide 20 MG tablet Commonly known as: LASIX   pantoprazole 40 MG tablet Commonly known as:  PROTONIX   Venclexta 100 MG tablet Generic drug: venetoclax       TAKE these medications    acyclovir 400 MG tablet Commonly known as: ZOVIRAX Take 1 tablet (400 mg total) by mouth 2 (two) times daily.   allopurinol 300 MG tablet Commonly known as: Zyloprim Take 1 tablet (300 mg total) by mouth daily.   BD Pen Needle Nano 2nd Gen 32G X 4 MM  Misc Generic drug: Insulin Pen Needle USE 1 PEN NEEDLE EVERY DAY AS DIRECTED   DULoxetine 60 MG capsule Commonly known as: Cymbalta Take 1 capsule (60 mg total) by mouth 2 (two) times daily.   FeroSul 325 (65 Fe) MG tablet Generic drug: ferrous sulfate Take 1 tablet (325 mg total) by mouth 2 (two) times daily with a meal for 14 days.   FT Enema Saline 7-19 GM/118ML Enem Place 133 mLs (1 enema total) rectally daily as needed for severe constipation.   FT Stool Softener 50-8.6 MG tablet Generic drug: senna-docusate Take 2 tablets by mouth daily as needed for mild constipation.   hydrOXYzine 25 MG tablet Commonly known as: ATARAX Take 1 tablet (25 mg total) by mouth 3 (three) times daily as needed for itching.   latanoprost 0.005 % ophthalmic solution Commonly known as: XALATAN Place 1 drop into both eyes at bedtime.   melatonin 5 MG Tabs Take 1 tablet (5 mg total) by mouth at bedtime.   metoprolol succinate 50 MG 24 hr tablet Commonly known as: TOPROL-XL Take 1 tablet (50 mg total) by mouth daily. Take with or immediately following a meal.   mirtazapine 15 MG tablet Commonly known as: REMERON Take 1 tablet (15 mg total) by mouth daily.   omeprazole 20 MG capsule Commonly known as: PRILOSEC Take 20 mg by mouth 2 (two) times daily.   ondansetron 4 MG tablet Commonly known as: ZOFRAN Take 1 tablet (4 mg total) by mouth every 6 (six) hours as needed for nausea.   ondansetron 8 MG tablet Commonly known as: Zofran Take 1 tablet (8 mg total) by mouth every 8 (eight) hours as needed for nausea or vomiting.   oxybutynin 10 MG 24 hr tablet Commonly known as: DITROPAN-XL Take 1 tablet (10 mg total) by mouth every evening.   polyethylene glycol powder 17 GM/SCOOP powder Commonly known as: GLYCOLAX/MIRALAX Take 17 g by mouth daily.   prochlorperazine 10 MG tablet Commonly known as: COMPAZINE Take 1 tablet (10 mg total) by mouth every 6 (six) hours as needed for nausea or  vomiting.   simvastatin 20 MG tablet Commonly known as: ZOCOR Take 1 tablet (20 mg total) by mouth daily.               Discharge Care Instructions  (From admission, onward)           Start     Ordered   05/25/23 0000  Discharge wound care:       Comments: Follow by RN   05/25/23 1149            Follow-up Information     Corky Downs, MD Follow up in 1 week(s).   Specialties: Internal Medicine, Cardiology Why: Hospital follow up Contact information: [address] [place] [address] [phone]         Mosetta Pigeon, MD Follow up in 2 week(s).   Specialty: Nephrology Contact information: [address] [place] [address] [phone]         Creig Hines, MD Follow  up in 1 week(s).   Specialty: Oncology Contact information: [address] [place] [address] [phone]                Discharge Exam: Ceasar Mons Weights   05/16/23 2055 05/19/23 0500 05/24/23 0500  Weight: 95.9 kg 100 kg 99.8 kg   General exam: Appears calm and comfortable  Respiratory system: Clear to auscultation. Respiratory effort normal. Cardiovascular system: S1 & S2 heard, RRR. No JVD, murmurs, rubs, gallops or clicks. No pedal edema. Gastrointestinal system: Abdomen is nondistended, soft and nontender. No organomegaly or masses felt. Normal bowel sounds heard. Central nervous system: Alert and oriented x2. No focal neurological deficits. Extremities: Symmetric 5 x 5 power. Skin: No rashes, lesions or ulcers Psychiatry: Judgement and insight appear normal. Mood & affect appropriate.    Condition at discharge: fair  The results of significant diagnostics from this hospitalization (including imaging, microbiology, ancillary and laboratory) are listed below for reference.   Imaging Studies: US RENAL Result Date: 05/16/2023 CLINICAL DATA:  Acute renal failure EXAM: RENAL / URINARY TRACT ULTRASOUND COMPLETE COMPARISON:  Renal CT  04/06/2023 FINDINGS: Right Kidney: Renal measurements: 13.0 x 6.9 x 7.4 cm = volume: 347 mL. Normal renal cortical thickness. Increased cortical echogenicity. No mass or hydronephrosis. Left Kidney: Renal measurements: 9.0 x 2.4 x 3.1 cm = volume: 36 mL. Renal cortical thinning. Increased cortical echogenicity. No mass or hydronephrosis. Bladder: Not well visualized. Other: None. IMPRESSION: 1. No hydronephrosis. 2. Increased cortical echogenicity as can be seen with chronic medical renal disease. 3. No definite mass identified within the right kidney to correspond with the mass described on recent CT 04/06/2023. Consider further evaluation with contrast-enhanced CT or MRI as clinically warranted. Electronically Signed   By: Annia Belt M.D.   On: 05/16/2023 14:29    Microbiology: Results for orders placed or performed during the hospital encounter of 05/16/23  MRSA Next Gen by PCR, Nasal     Status: None   Collection Time: 05/16/23  9:55 PM   Specimen: Nasal Mucosa; Nasal Swab  Result Value Ref Range Status   MRSA by PCR Next Gen NOT DETECTED NOT DETECTED Final    Comment: (NOTE) The GeneXpert MRSA Assay (FDA approved for NASAL specimens only), is one component of a comprehensive MRSA colonization surveillance program. It is not intended to diagnose MRSA infection nor to guide or monitor treatment for MRSA infections. Test performance is not FDA approved in patients less than 30 years old. Performed at [facility], [address] [place], [place] [address]     Labs: CBC: Recent Labs  Lab 05/19/23 [phone] 05/20/23 0356 05/21/23 0832 05/22/23 0518 05/24/23 0404  WBC 11.7* 11.7* 9.4 10.0 8.4  NEUTROABS 6.1  --   --   --   --   HGB 6.8* 8.2* 7.8* 8.5* 8.0*  HCT 21.4* 24.7* 24.2* 27.7* 25.3*  MCV 88.8 86.1 88.3 90.5 88.8  PLT 58* 70* 83* 98* 96*   Basic Metabolic Panel: Recent Labs  Lab 05/19/23 0409 05/20/23 0356 05/21/23 0826 05/22/23 0518 05/23/23 0347  05/24/23 0404 05/25/23 0806  NA 134* 135  --  138 135 139 139  K 4.0 4.1  --  4.4 4.5 4.5 4.7  CL 105 105  --  108 104 106 106  CO2 20* 20*  --  20* 21* 22 25  GLUCOSE 88 133*  --  86 100* 104* 86  BUN 67* 67*  --  70* 71* 70* 66*  CREATININE  4.34* 4.34*  --  3.90* 3.70* 3.50* 3.47*  CALCIUM 6.9* 7.2*  --  7.5* 7.6* 8.3* 8.3*  MG 1.4*  --  1.5*  --  1.4* 2.2 1.9  PHOS  --   --   --   --   --  5.1*  --    Liver Function Tests: Recent Labs  Lab 05/19/23 0409 05/23/23 0347 05/24/23 0404  AST 14*  --   --   ALT 9  --   --   ALKPHOS 69  --   --   BILITOT 0.7  --   --   PROT 5.9*  --   --   ALBUMIN <1.5* <1.5* 1.6*   CBG: No results for input(s): "GLUCAP" in the last 168 hours.  Discharge time spent: greater than 30 minutes.  Signed: Marrion Coy, MD [facility] 05/25/2023

## 2023-05-30 ENCOUNTER — Inpatient Hospital Stay: Attending: Oncology

## 2023-05-30 ENCOUNTER — Inpatient Hospital Stay (HOSPITAL_BASED_OUTPATIENT_CLINIC_OR_DEPARTMENT_OTHER): Admitting: Oncology

## 2023-05-30 ENCOUNTER — Ambulatory Visit

## 2023-05-30 ENCOUNTER — Encounter: Payer: Self-pay | Admitting: Oncology

## 2023-05-30 ENCOUNTER — Other Ambulatory Visit

## 2023-05-30 ENCOUNTER — Ambulatory Visit: Admitting: Oncology

## 2023-05-30 ENCOUNTER — Inpatient Hospital Stay

## 2023-05-30 DIAGNOSIS — N179 Acute kidney failure, unspecified: Secondary | ICD-10-CM | POA: Insufficient documentation

## 2023-05-30 DIAGNOSIS — I129 Hypertensive chronic kidney disease with stage 1 through stage 4 chronic kidney disease, or unspecified chronic kidney disease: Secondary | ICD-10-CM | POA: Insufficient documentation

## 2023-05-30 DIAGNOSIS — E1151 Type 2 diabetes mellitus with diabetic peripheral angiopathy without gangrene: Secondary | ICD-10-CM | POA: Insufficient documentation

## 2023-05-30 DIAGNOSIS — Z79624 Long term (current) use of inhibitors of nucleotide synthesis: Secondary | ICD-10-CM | POA: Insufficient documentation

## 2023-05-30 DIAGNOSIS — C9200 Acute myeloblastic leukemia, not having achieved remission: Secondary | ICD-10-CM

## 2023-05-30 DIAGNOSIS — D122 Benign neoplasm of ascending colon: Secondary | ICD-10-CM | POA: Diagnosis not present

## 2023-05-30 DIAGNOSIS — D638 Anemia in other chronic diseases classified elsewhere: Secondary | ICD-10-CM | POA: Diagnosis not present

## 2023-05-30 DIAGNOSIS — R768 Other specified abnormal immunological findings in serum: Secondary | ICD-10-CM | POA: Insufficient documentation

## 2023-05-30 DIAGNOSIS — Z7969 Long term (current) use of other immunomodulators and immunosuppressants: Secondary | ICD-10-CM | POA: Diagnosis not present

## 2023-05-30 DIAGNOSIS — E785 Hyperlipidemia, unspecified: Secondary | ICD-10-CM | POA: Insufficient documentation

## 2023-05-30 DIAGNOSIS — N1832 Chronic kidney disease, stage 3b: Secondary | ICD-10-CM | POA: Diagnosis not present

## 2023-05-30 DIAGNOSIS — R54 Age-related physical debility: Secondary | ICD-10-CM | POA: Diagnosis not present

## 2023-05-30 DIAGNOSIS — Z5111 Encounter for antineoplastic chemotherapy: Secondary | ICD-10-CM | POA: Diagnosis present

## 2023-05-30 DIAGNOSIS — D61818 Other pancytopenia: Secondary | ICD-10-CM | POA: Insufficient documentation

## 2023-05-30 DIAGNOSIS — Z823 Family history of stroke: Secondary | ICD-10-CM | POA: Insufficient documentation

## 2023-05-30 DIAGNOSIS — E1122 Type 2 diabetes mellitus with diabetic chronic kidney disease: Secondary | ICD-10-CM | POA: Insufficient documentation

## 2023-05-30 DIAGNOSIS — I739 Peripheral vascular disease, unspecified: Secondary | ICD-10-CM | POA: Diagnosis not present

## 2023-05-30 DIAGNOSIS — Z79899 Other long term (current) drug therapy: Secondary | ICD-10-CM

## 2023-05-30 DIAGNOSIS — Z79631 Long term (current) use of antimetabolite agent: Secondary | ICD-10-CM | POA: Insufficient documentation

## 2023-05-30 DIAGNOSIS — Z8249 Family history of ischemic heart disease and other diseases of the circulatory system: Secondary | ICD-10-CM | POA: Insufficient documentation

## 2023-05-30 DIAGNOSIS — R262 Difficulty in walking, not elsewhere classified: Secondary | ICD-10-CM | POA: Insufficient documentation

## 2023-05-30 LAB — CMP (CANCER CENTER ONLY)
ALT: 10 U/L (ref 0–44)
AST: 17 U/L (ref 15–41)
Albumin: 2.1 g/dL — ABNORMAL LOW (ref 3.5–5.0)
Alkaline Phosphatase: 95 U/L (ref 38–126)
Anion gap: 13 (ref 5–15)
BUN: 60 mg/dL — ABNORMAL HIGH (ref 8–23)
CO2: 20 mmol/L — ABNORMAL LOW (ref 22–32)
Calcium: 8.4 mg/dL — ABNORMAL LOW (ref 8.9–10.3)
Chloride: 105 mmol/L (ref 98–111)
Creatinine: 3.4 mg/dL — ABNORMAL HIGH (ref 0.61–1.24)
GFR, Estimated: 18 mL/min — ABNORMAL LOW (ref >60)
Glucose, Bld: 107 mg/dL — ABNORMAL HIGH (ref 70–99)
Potassium: 4.1 mmol/L (ref 3.5–5.1)
Sodium: 138 mmol/L (ref 135–145)
Total Bilirubin: 0.6 mg/dL (ref 0.0–1.2)
Total Protein: 7.7 g/dL (ref 6.5–8.1)

## 2023-05-30 LAB — CBC WITH DIFFERENTIAL (CANCER CENTER ONLY)
Abs Immature Granulocytes: 0.08 10*3/uL — ABNORMAL HIGH (ref 0.00–0.07)
Basophils Absolute: 0 10*3/uL (ref 0.0–0.1)
Basophils Relative: 0 %
Eosinophils Absolute: 0 10*3/uL (ref 0.0–0.5)
Eosinophils Relative: 0 %
HCT: 24.9 % — ABNORMAL LOW (ref 39.0–52.0)
Hemoglobin: 7.4 g/dL — ABNORMAL LOW (ref 13.0–17.0)
Immature Granulocytes: 1 %
Lymphocytes Relative: 46 %
Lymphs Abs: 2.8 10*3/uL (ref 0.7–4.0)
MCH: 27.4 pg (ref 26.0–34.0)
MCHC: 29.7 g/dL — ABNORMAL LOW (ref 30.0–36.0)
MCV: 92.2 fL (ref 80.0–100.0)
Monocytes Absolute: 0.5 10*3/uL (ref 0.1–1.0)
Monocytes Relative: 7 %
Neutro Abs: 2.8 10*3/uL (ref 1.7–7.7)
Neutrophils Relative %: 46 %
Platelet Count: 61 10*3/uL — ABNORMAL LOW (ref 150–400)
RBC: 2.7 MIL/uL — ABNORMAL LOW (ref 4.22–5.81)
RDW: 16.5 % — ABNORMAL HIGH (ref 11.5–15.5)
Smear Review: NORMAL
WBC Count: 6.2 10*3/uL (ref 4.0–10.5)
nRBC: 0 % (ref 0.0–0.2)

## 2023-05-30 LAB — SAMPLE TO BLOOD BANK

## 2023-05-31 ENCOUNTER — Inpatient Hospital Stay

## 2023-06-01 ENCOUNTER — Other Ambulatory Visit: Payer: Self-pay

## 2023-06-01 ENCOUNTER — Telehealth: Payer: Self-pay | Admitting: *Deleted

## 2023-06-01 NOTE — Telephone Encounter (Signed)
 She and the pt. Has questions about the kidney functions.  Then the both of them about do they wait 10 days and then come back and get treatment. Or come back next week. His appt in the computer is 4/16 with see MD, then labs, and then treatment

## 2023-06-01 NOTE — Telephone Encounter (Signed)
 Voice message left on [phone] for caregiver [name].  After reviewing chart, patient is to come to their next scheduled lab appointment on 06/06/23 at 10:45AM.  The patient will then meet with Dr. Smith Robert and report to infusion afterwards.  The previous message also indicated that the caregiver and patient had questions regarding kidney function.  Will attempt to follow up with patient at a later time.

## 2023-06-02 NOTE — Progress Notes (Unsigned)
 Hematology/Oncology Consult note [facility]  [phone] [phone]  Patient Care Team: Theron Flavin, MD as PCP - General (Internal Medicine) Avonne Boettcher, MD as Consulting Physician (Oncology)   Name of the patient: Robert [name]  [id number]  Sep 19, 1945   Date of visit: 06/02/23  Diagnosis- AML currently not in remission   Chief complaint/ Reason for visit- post hospital discharge follow up  Heme/Onc history: Patient is a 78 year old male with past medical history significant for hypertension hyperlipidemia type 2 diabetes peripheral arterial disease who is currently admitted to the hospital for symptoms of recurrent UTI and ESBL sepsis.  He was seen by me in January 2025 for posthospital discharge follow-up for pancytopenia.His hemoglobin from last year was 13 and had dropped down to 8.5 in June 2024. At that time his ferritin levels were normal at 121 but iron saturation low at 17% with a normal TIBC. Subsequently his iron saturation dropped to 13%. B12 levels and folate levels have been normal. We do not have any hemoglobin levels between last year and this year.  His anemia at that time was attributed to chronic disease but as planning to get a bone marrow biopsy down the line if the results of peripheral blood work did not reveal any discerning etiology.B12 level, TSH and haptoglobin at that time were normal.  Myeloma panel showed polyclonal increase in immunoglobulins but no monoclonal protein.  Serum free light chains showed both kappa and lambda light chain elevation but total ratio was normal.  Folate mildly low at 5.7.  Ferritin levels elevated at 1439   He underwent a colonoscopy by Dr. Mamie Searles on 08/21/2022 due to concerns of anemia and he was found to have a 5 cm sigmoid mass which was biopsied and was consistent with intramucosal adenocarcinoma with focal area suspicious for at least superficial invasion into the muscularis mucosa.  There was another flat lesion noted in the ascending colon which was not biopsied but was concerning in appearance. 3 other polyps in the ascending colon positive for tubular adenoma. Patient underwent Sigmoid colon partial colectomy on 01/01/2023 at [facility].  Final pathology showed invasive adenocarcinoma moderately differentiated with negative margins.  15 lymph nodes negative for malignancy.  pT1 N0   Given his persistent pancytopenia patient underwent bone marrow Biopsy which was consistent with acute myeloid leukemia with total blasts ranging between 20 to 30%.  Overall bone marrow was hypercellular 75% with significantly left shifted both myeloid and erythroid lineages.Morphologically difficult to a certain definitive blasts from immature precursors.  Greater than 20% were identified however on aspirate smear slides, 17% by flow cytometry and overall 30% by CD34 IHC.  Significantly increased CD1 1 7 positive immature precursors.  Findings consistent with AML.  This could be AML arising from MDS. Normal cytogenetics.  [facility] showed ASX cell 1, IDH 2, SRS F2, STA G2.  No mutation seen CALR, flit 3, IDH 1, JAK2, MPL, NPM1 or T p53   He is presently on metronomic weekly venetoclax plus decitabine    Interval history-after receiving 1 dose of weekly venetoclax and decitabine patient was noted to have AKI and his creatinine went up from 1.5-4.  He was hospitalized and had nephrology input as well and received IV fluids but creatinine remains around 4.4 at this time.  Overall patient is at his baseline but he is still unable to walk  ECOG PS- 3 Pain scale- 0   Review of systems- Review of Systems  Constitutional:  Positive for malaise/fatigue. Negative for chills, fever and weight loss.  HENT:  Negative for congestion, ear discharge and nosebleeds.   Eyes:  Negative for blurred vision.  Respiratory:  Negative for cough, hemoptysis, sputum production, shortness of breath and wheezing.    Cardiovascular:  Negative for chest pain, palpitations, orthopnea and claudication.  Gastrointestinal:  Negative for abdominal pain, blood in stool, constipation, diarrhea, heartburn, melena, nausea and vomiting.  Genitourinary:  Negative for dysuria, flank pain, frequency, hematuria and urgency.  Musculoskeletal:  Negative for back pain, joint pain and myalgias.  Skin:  Negative for rash.  Neurological:  Negative for dizziness, tingling, focal weakness, seizures, weakness and headaches.  Endo/Heme/Allergies:  Does not bruise/bleed easily.  Psychiatric/Behavioral:  Negative for depression and suicidal ideas. The patient does not have insomnia.       No Known Allergies   Past Medical History:  Diagnosis Date   Allergy    Cellulitis    Diabetes mellitus without complication (HCC)    Hyperlipidemia    Hypertension    Leukemia (HCC)    Peripheral vascular disease (HCC)      Past Surgical History:  Procedure Laterality Date   BIOPSY  08/21/2022   Procedure: BIOPSY;  Surgeon: Quintin Buckle, DO;  Location: [facility] ENDOSCOPY;  Service: Gastroenterology;;   COLONOSCOPY WITH PROPOFOL N/A 10/02/2017   Procedure: COLONOSCOPY WITH PROPOFOL;  Surgeon: Luke Salaam, MD;  Location: [facility] ENDOSCOPY;  Service: Gastroenterology;  Laterality: N/A;   COLONOSCOPY WITH PROPOFOL N/A 08/21/2022   Procedure: COLONOSCOPY WITH PROPOFOL;  Surgeon: Quintin Buckle, DO;  Location: [facility] ENDOSCOPY;  Service: Gastroenterology;  Laterality: N/A;   ESOPHAGOGASTRODUODENOSCOPY (EGD) WITH PROPOFOL N/A 08/11/2022   Procedure: ESOPHAGOGASTRODUODENOSCOPY (EGD) WITH PROPOFOL;  Surgeon: Marnee Sink, MD;  Location: [facility] ENDOSCOPY;  Service: Endoscopy;  Laterality: N/A;   HERNIA REPAIR     IR BONE MARROW BIOPSY & ASPIRATION  04/10/2023   PERICARDIOCENTESIS N/A 08/29/2022   Procedure: PERICARDIOCENTESIS;  Surgeon: Sammy Crisp, MD;  Location: [facility] INVASIVE CV LAB;  Service: Cardiovascular;  Laterality: N/A;    POLYPECTOMY  08/21/2022   Procedure: POLYPECTOMY;  Surgeon: Quintin Buckle, DO;  Location: [facility] ENDOSCOPY;  Service: Gastroenterology;;    Social History   Socioeconomic History   Marital status: Married    Spouse name: Not on file   Number of children: 0   Years of education: College Gradute   Highest education level: Bachelor's degree (e.g., BA, AB, BS)  Occupational History   Not on file  Tobacco Use   Smoking status: Never   Smokeless tobacco: Never  Vaping Use   Vaping status: Never Used  Substance and Sexual Activity   Alcohol use: Not Currently    Comment: Occasionally   Drug use: No   Sexual activity: Not Currently  Other Topics Concern   Not on file  Social History Narrative   Not on file   Social Drivers of Health   Financial Resource Strain: Low Risk  (12/15/2022)   Received from [facility]   Overall Financial Resource Strain (CARDIA)    Difficulty of Paying Living Expenses: Not hard at all  Food Insecurity: No Food Insecurity (05/16/2023)   Hunger Vital Sign    Worried About Running Out of Food in the Last Year: Never true    Ran Out of Food in the Last Year: Never true  Transportation Needs: No Transportation Needs (05/16/2023)   PRAPARE - Administrator, Civil Service (Medical): No  Lack of Transportation (Non-Medical): No  Physical Activity: Inactive (10/29/2020)   Exercise Vital Sign    Days of Exercise per Week: 0 days    Minutes of Exercise per Session: 0 min  Stress: No Stress Concern Present (10/29/2020)   [name] of Occupational Health - Occupational Stress Questionnaire    Feeling of Stress : Not at all  Social Connections: Patient Declined (05/16/2023)   Social Connection and Isolation Panel [NHANES]    Frequency of Communication with Friends and Family: Patient declined    Frequency of Social Gatherings with Friends and Family: Patient declined    Attends Religious Services: Patient declined     Database administrator or Organizations: Patient declined    Attends Banker Meetings: Patient declined    Marital Status: Patient declined  Intimate Partner Violence: Not At Risk (05/16/2023)   Humiliation, Afraid, Rape, and Kick questionnaire    Fear of Current or Ex-Partner: No    Emotionally Abused: No    Physically Abused: No    Sexually Abused: No    Family History  Problem Relation Age of Onset   Heart disease Mother    Stroke Mother    Heart disease Father    Heart attack Father      Current Outpatient Medications:    acyclovir (ZOVIRAX) 400 MG tablet, Take 1 tablet (400 mg total) by mouth 2 (two) times daily., Disp: 60 tablet, Rfl: 1   allopurinol (ZYLOPRIM) 300 MG tablet, Take 1 tablet (300 mg total) by mouth daily., Disp: 30 tablet, Rfl: 2   BD PEN NEEDLE NANO 2ND GEN 32G X 4 MM MISC, USE 1 PEN NEEDLE EVERY DAY AS DIRECTED, Disp: 100 each, Rfl: 4   DULoxetine (CYMBALTA) 60 MG capsule, Take 1 capsule (60 mg total) by mouth 2 (two) times daily., Disp: 14 capsule, Rfl: 0   ferrous sulfate 325 (65 FE) MG tablet, Take 1 tablet (325 mg total) by mouth 2 (two) times daily with a meal for 14 days. (Patient not taking: Reported on 05/16/2023), Disp: 28 tablet, Rfl: 0   hydrOXYzine (ATARAX) 25 MG tablet, Take 1 tablet (25 mg total) by mouth 3 (three) times daily as needed for itching., Disp: 10 tablet, Rfl: 0   latanoprost (XALATAN) 0.005 % ophthalmic solution, Place 1 drop into both eyes at bedtime., Disp: 2.5 mL, Rfl: 0   melatonin 5 MG TABS, Take 1 tablet (5 mg total) by mouth at bedtime., Disp: 7 tablet, Rfl: 0   metoprolol succinate (TOPROL-XL) 50 MG 24 hr tablet, Take 1 tablet (50 mg total) by mouth daily. Take with or immediately following a meal., Disp: 7 tablet, Rfl: 0   mirtazapine (REMERON) 15 MG tablet, Take 1 tablet (15 mg total) by mouth daily., Disp: 7 tablet, Rfl: 0   omeprazole (PRILOSEC) 20 MG capsule, Take 20 mg by mouth 2 (two) times daily., Disp: , Rfl:     ondansetron (ZOFRAN) 4 MG tablet, Take 1 tablet (4 mg total) by mouth every 6 (six) hours as needed for nausea., Disp: 20 tablet, Rfl: 0   ondansetron (ZOFRAN) 8 MG tablet, Take 1 tablet (8 mg total) by mouth every 8 (eight) hours as needed for nausea or vomiting., Disp: 30 tablet, Rfl: 1   oxybutynin (DITROPAN-XL) 10 MG 24 hr tablet, Take 1 tablet (10 mg total) by mouth every evening., Disp: 7 tablet, Rfl: 0   polyethylene glycol powder (GLYCOLAX/MIRALAX) 17 GM/SCOOP powder, Take 17 g by mouth daily., Disp: 238 g,  Rfl: 0   prochlorperazine (COMPAZINE) 10 MG tablet, Take 1 tablet (10 mg total) by mouth every 6 (six) hours as needed for nausea or vomiting., Disp: 30 tablet, Rfl: 1   senna-docusate (FT STOOL SOFTENER) 8.6-50 MG tablet, Take 2 tablets by mouth daily as needed for mild constipation., Disp: 14 tablet, Rfl: 0   simvastatin (ZOCOR) 20 MG tablet, Take 1 tablet (20 mg total) by mouth daily., Disp: 7 tablet, Rfl: 0   sodium phosphate (FLEET) ENEM, Place 133 mLs (1 enema total) rectally daily as needed for severe constipation., Disp: 133 mL, Rfl: 0  Physical exam:  Vitals:   05/30/23 1049  BP: 109/72  Pulse: 95  Resp: 19  Temp: 98 F (36.7 C)  TempSrc: Tympanic  SpO2: 100%  Weight: 220 lb (99.8 kg)  Height: 6\' 2"  (1.88 m)   Physical Exam Constitutional:      Comments: In a wheelchair and appears in no acute distress  Cardiovascular:     Rate and Rhythm: Normal rate and regular rhythm.     Heart sounds: Normal heart sounds.  Pulmonary:     Effort: Pulmonary effort is normal.     Breath sounds: Normal breath sounds.  Abdominal:     General: Bowel sounds are normal.     Palpations: Abdomen is soft.  Musculoskeletal:     Right lower leg: No edema.     Left lower leg: No edema.  Skin:    General: Skin is warm and dry.  Neurological:     Mental Status: He is alert and oriented to person, place, and time.      I have personally reviewed labs listed below:    Latest  Ref Rng & Units 05/30/2023   10:34 AM  CMP  Glucose 70 - 99 mg/dL 956   BUN 8 - 23 mg/dL 60   Creatinine 3.87 - 1.24 mg/dL 5.64   Sodium 332 - 951 mmol/L 138   Potassium 3.5 - 5.1 mmol/L 4.1   Chloride 98 - 111 mmol/L 105   CO2 22 - 32 mmol/L 20   Calcium 8.9 - 10.3 mg/dL 8.4   Total Protein 6.5 - 8.1 g/dL 7.7   Total Bilirubin 0.0 - 1.2 mg/dL 0.6   Alkaline Phos 38 - 126 U/L 95   AST 15 - 41 U/L 17   ALT 0 - 44 U/L 10       Latest Ref Rng & Units 05/30/2023   10:23 AM  CBC  WBC 4.0 - 10.5 K/uL 6.2   Hemoglobin 13.0 - 17.0 g/dL 7.4   Hematocrit 88.4 - 52.0 % 24.9   Platelets 150 - 400 K/uL 61    I have personally reviewed Radiology images listed below: No images are attached to the encounter.  US  RENAL Result Date: 05/16/2023 CLINICAL DATA:  Acute renal failure EXAM: RENAL / URINARY TRACT ULTRASOUND COMPLETE COMPARISON:  Renal CT 04/06/2023 FINDINGS: Right Kidney: Renal measurements: 13.0 x 6.9 x 7.4 cm = volume: 347 mL. Normal renal cortical thickness. Increased cortical echogenicity. No mass or hydronephrosis. Left Kidney: Renal measurements: 9.0 x 2.4 x 3.1 cm = volume: 36 mL. Renal cortical thinning. Increased cortical echogenicity. No mass or hydronephrosis. Bladder: Not well visualized. Other: None. IMPRESSION: 1. No hydronephrosis. 2. Increased cortical echogenicity as can be seen with chronic medical renal disease. 3. No definite mass identified within the right kidney to correspond with the mass described on recent CT 04/06/2023. Consider further evaluation with contrast-enhanced CT or MRI  as clinically warranted. Electronically Signed   By: Jone Neither M.D.   On: 05/16/2023 14:29     Assessment and plan- Patient is a 78 y.o. male with history of AML presently not in remission here for posthospital discharge follow-up  Patient received only 1 dose of venetoclax and decitabine weekly and following that his creatinine went up from 2.2-4.  He was hospitalized but there has been  no improvement in his creatinine whatsoever.  I would like him to follow-up with nephrology as an outpatient.  I will see him back in 10 days and see how his counts are doing.  It is going to be challenging to offer venetoclax plus decitabine in the presence of AKI but without treatment AML is uniformly fatal.  Patient does not wish to proceed with the supportive care or hospice at this time and wants to try treatment.  I will consider restarting weekly Decitabin at this time in 10 days and if creatinine remains stable we will add on venetoclax down the line as well at a reduced dose of 200 mg.   Visit Diagnosis 1. Acute myeloid leukemia not having achieved remission (HCC)   2. High risk medication use   3. AKI (acute kidney injury) (HCC)      Dr. Seretha Dance, MD, MPH [facility] at [facility] [phone] 06/02/2023 4:07 PM

## 2023-06-03 ENCOUNTER — Encounter: Payer: Self-pay | Admitting: Oncology

## 2023-06-04 ENCOUNTER — Telehealth: Payer: Self-pay | Admitting: *Deleted

## 2023-06-04 ENCOUNTER — Other Ambulatory Visit: Payer: Self-pay

## 2023-06-04 DIAGNOSIS — N179 Acute kidney failure, unspecified: Secondary | ICD-10-CM

## 2023-06-04 NOTE — Telephone Encounter (Signed)
 I called  3 times today and no answer and left message. Then Mercedse called again. I called back to see what she needs and she said she spoke to a staff person about the appt dates are ok. But then she wanted to work in for [employer] to call her for a few min. She has not spoke to you  in several weeks. Any time would be great

## 2023-06-04 NOTE — Telephone Encounter (Signed)
 I will call her today

## 2023-06-04 NOTE — Telephone Encounter (Signed)
 Valinda Gault RN will follow up with patient regarding message received.

## 2023-06-06 ENCOUNTER — Other Ambulatory Visit: Payer: Self-pay

## 2023-06-06 ENCOUNTER — Inpatient Hospital Stay

## 2023-06-06 ENCOUNTER — Inpatient Hospital Stay: Admitting: Oncology

## 2023-06-06 ENCOUNTER — Ambulatory Visit

## 2023-06-06 ENCOUNTER — Other Ambulatory Visit

## 2023-06-06 VITALS — HR 77 | Temp 97.5°F | Resp 18 | Wt 202.0 lb

## 2023-06-06 VITALS — BP 148/88 | HR 79 | Temp 97.1°F | Resp 18

## 2023-06-06 DIAGNOSIS — N179 Acute kidney failure, unspecified: Secondary | ICD-10-CM

## 2023-06-06 DIAGNOSIS — Z79899 Other long term (current) drug therapy: Secondary | ICD-10-CM

## 2023-06-06 DIAGNOSIS — C9200 Acute myeloblastic leukemia, not having achieved remission: Secondary | ICD-10-CM

## 2023-06-06 DIAGNOSIS — D649 Anemia, unspecified: Secondary | ICD-10-CM

## 2023-06-06 DIAGNOSIS — Z5111 Encounter for antineoplastic chemotherapy: Secondary | ICD-10-CM

## 2023-06-06 LAB — CMP (CANCER CENTER ONLY)
ALT: 9 U/L (ref 0–44)
AST: 16 U/L (ref 15–41)
Albumin: 2.1 g/dL — ABNORMAL LOW (ref 3.5–5.0)
Alkaline Phosphatase: 90 U/L (ref 38–126)
Anion gap: 10 (ref 5–15)
BUN: 56 mg/dL — ABNORMAL HIGH (ref 8–23)
CO2: 17 mmol/L — ABNORMAL LOW (ref 22–32)
Calcium: 8.2 mg/dL — ABNORMAL LOW (ref 8.9–10.3)
Chloride: 108 mmol/L (ref 98–111)
Creatinine: 3.53 mg/dL — ABNORMAL HIGH (ref 0.61–1.24)
GFR, Estimated: 17 mL/min — ABNORMAL LOW (ref >60)
Glucose, Bld: 105 mg/dL — ABNORMAL HIGH (ref 70–99)
Potassium: 3.9 mmol/L (ref 3.5–5.1)
Sodium: 135 mmol/L (ref 135–145)
Total Bilirubin: 0.7 mg/dL (ref 0.0–1.2)
Total Protein: 7.6 g/dL (ref 6.5–8.1)

## 2023-06-06 LAB — CBC WITH DIFFERENTIAL (CANCER CENTER ONLY)
Abs Immature Granulocytes: 0.02 10*3/uL (ref 0.00–0.07)
Basophils Absolute: 0 10*3/uL (ref 0.0–0.1)
Basophils Relative: 0 %
Eosinophils Absolute: 0 10*3/uL (ref 0.0–0.5)
Eosinophils Relative: 0 %
HCT: 20.8 % — ABNORMAL LOW (ref 39.0–52.0)
Hemoglobin: 6.2 g/dL — CL (ref 13.0–17.0)
Immature Granulocytes: 0 %
Lymphocytes Relative: 60 %
Lymphs Abs: 2.7 10*3/uL (ref 0.7–4.0)
MCH: 27.8 pg (ref 26.0–34.0)
MCHC: 29.8 g/dL — ABNORMAL LOW (ref 30.0–36.0)
MCV: 93.3 fL (ref 80.0–100.0)
Monocytes Absolute: 0.4 10*3/uL (ref 0.1–1.0)
Monocytes Relative: 9 %
Neutro Abs: 1.4 10*3/uL — ABNORMAL LOW (ref 1.7–7.7)
Neutrophils Relative %: 31 %
Platelet Count: 24 10*3/uL — ABNORMAL LOW (ref 150–400)
RBC: 2.23 MIL/uL — ABNORMAL LOW (ref 4.22–5.81)
RDW: 16.6 % — ABNORMAL HIGH (ref 11.5–15.5)
Smear Review: NORMAL
WBC Count: 4.5 10*3/uL (ref 4.0–10.5)
nRBC: 0 % (ref 0.0–0.2)

## 2023-06-06 LAB — URIC ACID: Uric Acid, Serum: 5.9 mg/dL (ref 3.7–8.6)

## 2023-06-06 LAB — PHOSPHORUS: Phosphorus: 5.8 mg/dL — ABNORMAL HIGH (ref 2.5–4.6)

## 2023-06-06 MED ORDER — DECITABINE CHEMO SQ INJECTION 50 MG
20.0000 mg | Freq: Once | SUBCUTANEOUS | Status: AC
  Administered 2023-06-06: 20 mg via SUBCUTANEOUS
  Filled 2023-06-06: qty 2

## 2023-06-06 MED ORDER — SODIUM CHLORIDE 0.9 % IV SOLN
Freq: Once | INTRAVENOUS | Status: AC
Start: 2023-06-06 — End: 2023-06-06
  Filled 2023-06-06: qty 250

## 2023-06-06 MED ORDER — PROCHLORPERAZINE MALEATE 10 MG PO TABS
10.0000 mg | ORAL_TABLET | Freq: Once | ORAL | Status: AC
  Administered 2023-06-06: 10 mg via ORAL
  Filled 2023-06-06: qty 1

## 2023-06-06 NOTE — Patient Instructions (Signed)
 [facility] - A DEPT OF [facility]  Discharge Instructions: Thank you for choosing [facility] to provide your oncology and hematology care.  If you have a lab appointment with the Cancer Center, please go directly to the Cancer Center and check in at the registration area.  Wear comfortable clothing and clothing appropriate for easy access to any Portacath or PICC line.   We strive to give you quality time with your provider. You may need to reschedule your appointment if you arrive late (15 or more minutes).  Arriving late affects you and other patients whose appointments are after yours.  Also, if you miss three or more appointments without notifying the office, you may be dismissed from the clinic at the provider's discretion.      For prescription refill requests, have your pharmacy contact our office and allow 72 hours for refills to be completed.    Today you received the following chemotherapy and/or immunotherapy agents GEMCITIBINE      To help prevent nausea and vomiting after your treatment, we encourage you to take your nausea medication as directed.  BELOW ARE SYMPTOMS THAT SHOULD BE REPORTED IMMEDIATELY: *FEVER GREATER THAN 100.4 F (38 C) OR HIGHER *CHILLS OR SWEATING *NAUSEA AND VOMITING THAT IS NOT CONTROLLED WITH YOUR NAUSEA MEDICATION *UNUSUAL SHORTNESS OF BREATH *UNUSUAL BRUISING OR BLEEDING *URINARY PROBLEMS (pain or burning when urinating, or frequent urination) *BOWEL PROBLEMS (unusual diarrhea, constipation, pain near the anus) TENDERNESS IN MOUTH AND THROAT WITH OR WITHOUT PRESENCE OF ULCERS (sore throat, sores in mouth, or a toothache) UNUSUAL RASH, SWELLING OR PAIN  UNUSUAL VAGINAL DISCHARGE OR ITCHING   Items with * indicate a potential emergency and should be followed up as soon as possible or go to the Emergency Department if any problems should occur.  Please show the CHEMOTHERAPY ALERT CARD or IMMUNOTHERAPY  ALERT CARD at check-in to the Emergency Department and triage nurse.  Should you have questions after your visit or need to cancel or reschedule your appointment, please contact [facility] - A DEPT OF [facility]  [phone] and follow the prompts.  Office hours are 8:00 a.m. to 4:30 p.m. Monday - Friday. Please note that voicemails left after 4:00 p.m. may not be returned until the following business day.  We are closed weekends and major holidays. You have access to a nurse at all times for urgent questions. Please call the main number to the clinic [phone] and follow the prompts.  For any non-urgent questions, you may also contact your provider using MyChart. We now offer e-Visits for anyone 46 and older to request care online for non-urgent symptoms. For details visit [email or web address].   Also download the MyChart app! Go to the app store, search "MyChart", open the app, select [place], and log in with your MyChart username and password.  Gemcitabine Injection What is this medication? GEMCITABINE (jem SYE ta been) treats some types of cancer. It works by slowing down the growth of cancer cells. This medicine may be used for other purposes; ask your health care provider or pharmacist if you have questions. COMMON BRAND NAME(S): Gemzar, Infugem What should I tell my care team before I take this medication? They need to know if you have any of these conditions: Blood disorders Infection Kidney disease Liver disease Lung or breathing disease, such as asthma or COPD Recent or ongoing radiation therapy An unusual or allergic  reaction to gemcitabine, other medications, foods, dyes, or preservatives If you or your partner are pregnant or trying to get pregnant Breast-feeding How should I use this medication? This medication is injected into a vein. It is given by your care team in a hospital or clinic setting. Talk to your care team about the  use of this medication in children. Special care may be needed. Overdosage: If you think you have taken too much of this medicine contact a poison control center or emergency room at once. NOTE: This medicine is only for you. Do not share this medicine with others. What if I miss a dose? Keep appointments for follow-up doses. It is important not to miss your dose. Call your care team if you are unable to keep an appointment. What may interact with this medication? Interactions have not been studied. This list may not describe all possible interactions. Give your health care provider a list of all the medicines, herbs, non-prescription drugs, or dietary supplements you use. Also tell them if you smoke, drink alcohol, or use illegal drugs. Some items may interact with your medicine. What should I watch for while using this medication? Your condition will be monitored carefully while you are receiving this medication. This medication may make you feel generally unwell. This is not uncommon, as chemotherapy can affect healthy cells as well as cancer cells. Report any side effects. Continue your course of treatment even though you feel ill unless your care team tells you to stop. In some cases, you may be given additional medications to help with side effects. Follow all directions for their use. This medication may increase your risk of getting an infection. Call your care team for advice if you get a fever, chills, sore throat, or other symptoms of a cold or flu. Do not treat yourself. Try to avoid being around people who are sick. This medication may increase your risk to bruise or bleed. Call your care team if you notice any unusual bleeding. Be careful brushing or flossing your teeth or using a toothpick because you may get an infection or bleed more easily. If you have any dental work done, tell your dentist you are receiving this medication. Avoid taking medications that contain aspirin,  acetaminophen, ibuprofen, naproxen, or ketoprofen unless instructed by your care team. These medications may hide a fever. Talk to your care team if you or your partner wish to become pregnant or think you might be pregnant. This medication can cause serious birth defects if taken during pregnancy and for 6 months after the last dose. A negative pregnancy test is required before starting this medication. A reliable form of contraception is recommended while taking this medication and for 6 months after the last dose. Talk to your care team about effective forms of contraception. Do not father a child while taking this medication and for 3 months after the last dose. Use a condom while having sex during this time period. Do not breastfeed while taking this medication and for at least 1 week after the last dose. This medication may cause infertility. Talk to your care team if you are concerned about your fertility. What side effects may I notice from receiving this medication? Side effects that you should report to your care team as soon as possible: Allergic reactions--skin rash, itching, hives, swelling of the face, lips, tongue, or throat Capillary leak syndrome--stomach or muscle pain, unusual weakness or fatigue, feeling faint or lightheaded, decrease in the amount of urine, swelling of  the ankles, hands, or feet, trouble breathing Infection--fever, chills, cough, sore throat, wounds that don't heal, pain or trouble when passing urine, general feeling of discomfort or being unwell Liver injury--right upper belly pain, loss of appetite, nausea, light-colored stool, dark yellow or brown urine, yellowing skin or eyes, unusual weakness or fatigue Low red blood cell level--unusual weakness or fatigue, dizziness, headache, trouble breathing Lung injury--shortness of breath or trouble breathing, cough, spitting up blood, chest pain, fever Stomach pain, bloody diarrhea, pale skin, unusual weakness or fatigue,  decrease in the amount of urine, which may be signs of hemolytic uremic syndrome Sudden and severe headache, confusion, change in vision, seizures, which may be signs of posterior reversible encephalopathy syndrome (PRES) Unusual bruising or bleeding Side effects that usually do not require medical attention (report to your care team if they continue or are bothersome): Diarrhea Drowsiness Hair loss Nausea Pain, redness, or swelling with sores inside the mouth or throat Vomiting This list may not describe all possible side effects. Call your doctor for medical advice about side effects. You may report side effects to FDA at 1-800-FDA-1088. Where should I keep my medication? This medication is given in a hospital or clinic. It will not be stored at home. NOTE: This sheet is a summary. It may not cover all possible information. If you have questions about this medicine, talk to your doctor, pharmacist, or health care provider.  2024 Elsevier/Gold Standard (2021-06-14 00:00:00)

## 2023-06-06 NOTE — Progress Notes (Signed)
 Hematology/Oncology Consult note [facility]  [phone] [phone]  Patient Care Team: Corky Downs, MD as PCP - General (Internal Medicine) Creig Hines, MD as Consulting Physician (Oncology)   Name of the patient: Robert [name]  [id number]  09-29-1945   Date of visit: 06/06/23  Diagnosis-  AML not currently in remission  Chief complaint/ Reason for visit-on treatment assessment prior to cycle 2 of Decitabin  Heme/Onc history: Patient is a 78 year old male with past medical history significant for hypertension hyperlipidemia type 2 diabetes peripheral arterial disease who is currently admitted to the hospital for symptoms of recurrent UTI and ESBL sepsis.  He was seen by me in January 2025 for posthospital discharge follow-up for pancytopenia.His hemoglobin from last year was 13 and had dropped down to 8.5 in June 2024. At that time his ferritin levels were normal at 121 but iron saturation low at 17% with a normal TIBC. Subsequently his iron saturation dropped to 13%. B12 levels and folate levels have been normal. We do not have any hemoglobin levels between last year and this year.  His anemia at that time was attributed to chronic disease but as planning to get a bone marrow biopsy down the line if the results of peripheral blood work did not reveal any discerning etiology.B12 level, TSH and haptoglobin at that time were normal.  Myeloma panel showed polyclonal increase in immunoglobulins but no monoclonal protein.  Serum free light chains showed both kappa and lambda light chain elevation but total ratio was normal.  Folate mildly low at 5.7.  Ferritin levels elevated at 1439   He underwent a colonoscopy by Dr. Timothy Lasso on 08/21/2022 due to concerns of anemia and he was found to have a 5 cm sigmoid mass which was biopsied and was consistent with intramucosal adenocarcinoma with focal area suspicious for at least superficial invasion into the  muscularis mucosa. There was another flat lesion noted in the ascending colon which was not biopsied but was concerning in appearance. 3 other polyps in the ascending colon positive for tubular adenoma. Patient underwent Sigmoid colon partial colectomy on 01/01/2023 at [facility].  Final pathology showed invasive adenocarcinoma moderately differentiated with negative margins.  15 lymph nodes negative for malignancy.  pT1 N0   Given his persistent pancytopenia patient underwent bone marrow Biopsy which was consistent with acute myeloid leukemia with total blasts ranging between 20 to 30%.  Overall bone marrow was hypercellular 75% with significantly left shifted both myeloid and erythroid lineages.Morphologically difficult to a certain definitive blasts from immature precursors.  Greater than 20% were identified however on aspirate smear slides, 17% by flow cytometry and overall 30% by CD34 IHC.  Significantly increased CD1 1 7 positive immature precursors.  Findings consistent with AML.  This could be AML arising from MDS. Normal cytogenetics.  [facility] showed ASX cell 1, IDH 2, SRS F2, STA G2.  No mutation seen CALR, flit 3, IDH 1, JAK2, MPL, NPM1 or T p53   He is presently on metronomic weekly venetoclax plus decitabine.  Venetoclax presently on hold due to AKI  Interval history-[facility] and is presently feeling at his baseline.  Denies any specific complaints today.  He is seeing nephrology tomorrow  ECOG PS- 3 Pain scale- 0   Review of systems- Review of Systems  Constitutional:  Positive for malaise/fatigue. Negative for chills, fever and weight loss.  HENT:  Negative for congestion, ear discharge and nosebleeds.   Eyes:  Negative  for blurred vision.  Respiratory:  Negative for cough, hemoptysis, sputum production, shortness of breath and wheezing.   Cardiovascular:  Negative for chest pain, palpitations, orthopnea and claudication.  Gastrointestinal:  Negative for abdominal  pain, blood in stool, constipation, diarrhea, heartburn, melena, nausea and vomiting.  Genitourinary:  Negative for dysuria, flank pain, frequency, hematuria and urgency.  Musculoskeletal:  Negative for back pain, joint pain and myalgias.  Skin:  Negative for rash.  Neurological:  Negative for dizziness, tingling, focal weakness, seizures, weakness and headaches.  Endo/Heme/Allergies:  Does not bruise/bleed easily.  Psychiatric/Behavioral:  Negative for depression and suicidal ideas. The patient does not have insomnia.       No Known Allergies   Past Medical History:  Diagnosis Date   Allergy    Cellulitis    Diabetes mellitus without complication (HCC)    Hyperlipidemia    Hypertension    Leukemia (HCC)    Peripheral vascular disease (HCC)      Past Surgical History:  Procedure Laterality Date   BIOPSY  08/21/2022   Procedure: BIOPSY;  Surgeon: Quintin Buckle, DO;  Location: [facility] ENDOSCOPY;  Service: Gastroenterology;;   COLONOSCOPY WITH PROPOFOL N/A 10/02/2017   Procedure: COLONOSCOPY WITH PROPOFOL;  Surgeon: Luke Salaam, MD;  Location: [facility] ENDOSCOPY;  Service: Gastroenterology;  Laterality: N/A;   COLONOSCOPY WITH PROPOFOL N/A 08/21/2022   Procedure: COLONOSCOPY WITH PROPOFOL;  Surgeon: Quintin Buckle, DO;  Location: [facility] ENDOSCOPY;  Service: Gastroenterology;  Laterality: N/A;   ESOPHAGOGASTRODUODENOSCOPY (EGD) WITH PROPOFOL N/A 08/11/2022   Procedure: ESOPHAGOGASTRODUODENOSCOPY (EGD) WITH PROPOFOL;  Surgeon: Marnee Sink, MD;  Location: [facility] ENDOSCOPY;  Service: Endoscopy;  Laterality: N/A;   HERNIA REPAIR     IR BONE MARROW BIOPSY & ASPIRATION  04/10/2023   PERICARDIOCENTESIS N/A 08/29/2022   Procedure: PERICARDIOCENTESIS;  Surgeon: Sammy Crisp, MD;  Location: [facility] INVASIVE CV LAB;  Service: Cardiovascular;  Laterality: N/A;   POLYPECTOMY  08/21/2022   Procedure: POLYPECTOMY;  Surgeon: Quintin Buckle, DO;  Location: [facility] ENDOSCOPY;  Service:  Gastroenterology;;    Social History   Socioeconomic History   Marital status: Married    Spouse name: Not on file   Number of children: 0   Years of education: College Gradute   Highest education level: Bachelor's degree (e.g., BA, AB, BS)  Occupational History   Not on file  Tobacco Use   Smoking status: Never   Smokeless tobacco: Never  Vaping Use   Vaping status: Never Used  Substance and Sexual Activity   Alcohol use: Not Currently    Comment: Occasionally   Drug use: No   Sexual activity: Not Currently  Other Topics Concern   Not on file  Social History Narrative   Not on file   Social Drivers of Health   Financial Resource Strain: Low Risk  (12/15/2022)   [facility]   Overall Financial Resource Strain (CARDIA)    Difficulty of Paying Living Expenses: Not hard at all  Food Insecurity: No Food Insecurity (05/16/2023)   Hunger Vital Sign    Worried About Running Out of Food in the Last Year: Never true    Ran Out of Food in the Last Year: Never true  Transportation Needs: No Transportation Needs (05/16/2023)   PRAPARE - Administrator, Civil Service (Medical): No    Lack of Transportation (Non-Medical): No  Physical Activity: Inactive (10/29/2020)   Exercise Vital Sign    Days of Exercise per Week: 0 days  Minutes of Exercise per Session: 0 min  Stress: No Stress Concern Present (10/29/2020)   [name] of Occupational Health - Occupational Stress Questionnaire    Feeling of Stress : Not at all  Social Connections: Patient Declined (05/16/2023)   Social Connection and Isolation Panel [NHANES]    Frequency of Communication with Friends and Family: Patient declined    Frequency of Social Gatherings with Friends and Family: Patient declined    Attends Religious Services: Patient declined    Database administrator or Organizations: Patient declined    Attends Banker Meetings: Patient declined     Marital Status: Patient declined  Intimate Partner Violence: Not At Risk (05/16/2023)   Humiliation, Afraid, Rape, and Kick questionnaire    Fear of Current or Ex-Partner: No    Emotionally Abused: No    Physically Abused: No    Sexually Abused: No    Family History  Problem Relation Age of Onset   Heart disease Mother    Stroke Mother    Heart disease Father    Heart attack Father      Current Outpatient Medications:    acyclovir (ZOVIRAX) 400 MG tablet, Take 1 tablet (400 mg total) by mouth 2 (two) times daily., Disp: 60 tablet, Rfl: 1   allopurinol (ZYLOPRIM) 300 MG tablet, Take 1 tablet (300 mg total) by mouth daily., Disp: 30 tablet, Rfl: 2   BD PEN NEEDLE NANO 2ND GEN 32G X 4 MM MISC, USE 1 PEN NEEDLE EVERY DAY AS DIRECTED, Disp: 100 each, Rfl: 4   DULoxetine (CYMBALTA) 60 MG capsule, Take 1 capsule (60 mg total) by mouth 2 (two) times daily., Disp: 14 capsule, Rfl: 0   hydrOXYzine (ATARAX) 25 MG tablet, Take 1 tablet (25 mg total) by mouth 3 (three) times daily as needed for itching., Disp: 10 tablet, Rfl: 0   latanoprost (XALATAN) 0.005 % ophthalmic solution, Place 1 drop into both eyes at bedtime., Disp: 2.5 mL, Rfl: 0   melatonin 5 MG TABS, Take 1 tablet (5 mg total) by mouth at bedtime., Disp: 7 tablet, Rfl: 0   metoprolol succinate (TOPROL-XL) 50 MG 24 hr tablet, Take 1 tablet (50 mg total) by mouth daily. Take with or immediately following a meal., Disp: 7 tablet, Rfl: 0   mirtazapine (REMERON) 15 MG tablet, Take 1 tablet (15 mg total) by mouth daily., Disp: 7 tablet, Rfl: 0   omeprazole (PRILOSEC) 20 MG capsule, Take 20 mg by mouth 2 (two) times daily., Disp: , Rfl:    ondansetron (ZOFRAN) 4 MG tablet, Take 1 tablet (4 mg total) by mouth every 6 (six) hours as needed for nausea., Disp: 20 tablet, Rfl: 0   ondansetron (ZOFRAN) 8 MG tablet, Take 1 tablet (8 mg total) by mouth every 8 (eight) hours as needed for nausea or vomiting., Disp: 30 tablet, Rfl: 1   oxybutynin  (DITROPAN-XL) 10 MG 24 hr tablet, Take 1 tablet (10 mg total) by mouth every evening., Disp: 7 tablet, Rfl: 0   polyethylene glycol powder (GLYCOLAX/MIRALAX) 17 GM/SCOOP powder, Take 17 g by mouth daily., Disp: 238 g, Rfl: 0   prochlorperazine (COMPAZINE) 10 MG tablet, Take 1 tablet (10 mg total) by mouth every 6 (six) hours as needed for nausea or vomiting., Disp: 30 tablet, Rfl: 1   senna-docusate (FT STOOL SOFTENER) 8.6-50 MG tablet, Take 2 tablets by mouth daily as needed for mild constipation., Disp: 14 tablet, Rfl: 0   simvastatin (ZOCOR) 20 MG tablet, Take 1  tablet (20 mg total) by mouth daily., Disp: 7 tablet, Rfl: 0   sodium phosphate (FLEET) ENEM, Place 133 mLs (1 enema total) rectally daily as needed for severe constipation., Disp: 133 mL, Rfl: 0   ferrous sulfate 325 (65 FE) MG tablet, Take 1 tablet (325 mg total) by mouth 2 (two) times daily with a meal for 14 days. (Patient not taking: Reported on 05/16/2023), Disp: 28 tablet, Rfl: 0 No current facility-administered medications for this visit.  Facility-Administered Medications Ordered in Other Visits:    decitabine (DACOGEN) chemo SQ injection 20 mg, 20 mg, Subcutaneous, Once, Alinda Dooms, NP  Physical exam:  Vitals:   06/06/23 1113  Pulse: 77  Resp: 18  Temp: (!) 97.5 F (36.4 C)  SpO2: 100%  Weight: 202 lb (91.6 kg)   Physical Exam Constitutional:      Comments:  sitting in a wheelchair and appears in no acute distress  Cardiovascular:     Rate and Rhythm: Normal rate and regular rhythm.     Heart sounds: Normal heart sounds.  Pulmonary:     Effort: Pulmonary effort is normal.     Breath sounds: Normal breath sounds.  Abdominal:     General: Bowel sounds are normal.     Palpations: Abdomen is soft.  Musculoskeletal:     Right lower leg: No edema.     Left lower leg: No edema.  Skin:    General: Skin is warm and dry.  Neurological:     Mental Status: He is alert and oriented to person, place, and time.       I have personally reviewed labs listed below:    Latest Ref Rng & Units 06/06/2023   10:50 AM  CMP  Glucose 70 - 99 mg/dL 409   BUN 8 - 23 mg/dL 56   Creatinine 8.11 - 1.24 mg/dL 9.14   Sodium 782 - 956 mmol/L 135   Potassium 3.5 - 5.1 mmol/L 3.9   Chloride 98 - 111 mmol/L 108   CO2 22 - 32 mmol/L 17   Calcium 8.9 - 10.3 mg/dL 8.2   Total Protein 6.5 - 8.1 g/dL 7.6   Total Bilirubin 0.0 - 1.2 mg/dL 0.7   Alkaline Phos 38 - 126 U/L 90   AST 15 - 41 U/L 16   ALT 0 - 44 U/L 9       Latest Ref Rng & Units 06/06/2023   10:50 AM  CBC  WBC 4.0 - 10.5 K/uL 4.5   Hemoglobin 13.0 - 17.0 g/dL 6.2   Hematocrit 21.3 - 52.0 % 20.8   Platelets 150 - 400 K/uL 24    I have personally reviewed Radiology images listed below: No images are attached to the encounter.  US RENAL Result Date: 05/16/2023 CLINICAL DATA:  Acute renal failure EXAM: RENAL / URINARY TRACT ULTRASOUND COMPLETE COMPARISON:  Renal CT 04/06/2023 FINDINGS: Right Kidney: Renal measurements: 13.0 x 6.9 x 7.4 cm = volume: 347 mL. Normal renal cortical thickness. Increased cortical echogenicity. No mass or hydronephrosis. Left Kidney: Renal measurements: 9.0 x 2.4 x 3.1 cm = volume: 36 mL. Renal cortical thinning. Increased cortical echogenicity. No mass or hydronephrosis. Bladder: Not well visualized. Other: None. IMPRESSION: 1. No hydronephrosis. 2. Increased cortical echogenicity as can be seen with chronic medical renal disease. 3. No definite mass identified within the right kidney to correspond with the mass described on recent CT 04/06/2023. Consider further evaluation with contrast-enhanced CT or MRI as clinically warranted. Electronically Signed  By: Jone Neither M.D.   On: 05/16/2023 14:29     Assessment and plan- Patient is a 78 y.o. male with history of AML not currently in remission here for a routine follow-up  Patient has only received 1 dose of venetoclax and decitabine so far And his creatinine increased from 2-4  subsequently.  Today it is somewhat better at 3.5.  He is seeing nephrology tomorrow.  I plan to proceed with Decitabin today but continue to hold venetoclax.  If kidney function stabilize or improve I will plan to restart venetoclax at a lower dose of 200 mg weekly if he can tolerate it.  Patient also has evidence of IDH 2 mutation and therefore enasidenib is an option down the line but carries a risk of differentiation syndrome.  Hemoglobin is 6.2 today and will plan for 1 unit of PRBC transfusion tomorrow  Overall given his age frailty and multiple comorbidities as well as AKI on CKD his prognosis is overall poor.  Patient does not wish to proceed with hospice at this time and wishes to try treatment.   Visit Diagnosis 1. Acute myeloid leukemia not having achieved remission (HCC)   2. Encounter for antineoplastic chemotherapy   3. Symptomatic anemia      Dr. Seretha Dance, MD, MPH [facility] [phone] 06/06/2023 12:51 PM

## 2023-06-07 ENCOUNTER — Other Ambulatory Visit: Payer: Self-pay

## 2023-06-07 ENCOUNTER — Inpatient Hospital Stay

## 2023-06-07 ENCOUNTER — Emergency Department

## 2023-06-07 ENCOUNTER — Encounter: Payer: Self-pay | Admitting: Emergency Medicine

## 2023-06-07 ENCOUNTER — Inpatient Hospital Stay
Admission: EM | Admit: 2023-06-07 | Discharge: 2023-06-14 | DRG: 871 | Disposition: A | Discharge status: Skilled Nursing Facility | Source: Skilled Nursing Facility | Attending: Internal Medicine | Admitting: Internal Medicine

## 2023-06-07 DIAGNOSIS — D696 Thrombocytopenia, unspecified: Secondary | ICD-10-CM | POA: Diagnosis present

## 2023-06-07 DIAGNOSIS — N184 Chronic kidney disease, stage 4 (severe): Secondary | ICD-10-CM | POA: Diagnosis present

## 2023-06-07 DIAGNOSIS — E8721 Acute metabolic acidosis: Secondary | ICD-10-CM

## 2023-06-07 DIAGNOSIS — E1122 Type 2 diabetes mellitus with diabetic chronic kidney disease: Secondary | ICD-10-CM | POA: Diagnosis present

## 2023-06-07 DIAGNOSIS — N261 Atrophy of kidney (terminal): Secondary | ICD-10-CM | POA: Diagnosis present

## 2023-06-07 DIAGNOSIS — G928 Other toxic encephalopathy: Secondary | ICD-10-CM | POA: Diagnosis present

## 2023-06-07 DIAGNOSIS — E876 Hypokalemia: Secondary | ICD-10-CM | POA: Diagnosis present

## 2023-06-07 DIAGNOSIS — Z7189 Other specified counseling: Secondary | ICD-10-CM | POA: Diagnosis not present

## 2023-06-07 DIAGNOSIS — Z823 Family history of stroke: Secondary | ICD-10-CM

## 2023-06-07 DIAGNOSIS — K219 Gastro-esophageal reflux disease without esophagitis: Secondary | ICD-10-CM | POA: Diagnosis present

## 2023-06-07 DIAGNOSIS — E1151 Type 2 diabetes mellitus with diabetic peripheral angiopathy without gangrene: Secondary | ICD-10-CM | POA: Diagnosis present

## 2023-06-07 DIAGNOSIS — N1831 Chronic kidney disease, stage 3a: Secondary | ICD-10-CM | POA: Diagnosis not present

## 2023-06-07 DIAGNOSIS — E119 Type 2 diabetes mellitus without complications: Secondary | ICD-10-CM

## 2023-06-07 DIAGNOSIS — Z85038 Personal history of other malignant neoplasm of large intestine: Secondary | ICD-10-CM

## 2023-06-07 DIAGNOSIS — D509 Iron deficiency anemia, unspecified: Secondary | ICD-10-CM | POA: Diagnosis present

## 2023-06-07 DIAGNOSIS — R579 Shock, unspecified: Secondary | ICD-10-CM | POA: Diagnosis not present

## 2023-06-07 DIAGNOSIS — Z66 Do not resuscitate: Secondary | ICD-10-CM | POA: Diagnosis not present

## 2023-06-07 DIAGNOSIS — R4182 Altered mental status, unspecified: Secondary | ICD-10-CM

## 2023-06-07 DIAGNOSIS — N390 Urinary tract infection, site not specified: Secondary | ICD-10-CM | POA: Diagnosis present

## 2023-06-07 DIAGNOSIS — E860 Dehydration: Secondary | ICD-10-CM | POA: Diagnosis present

## 2023-06-07 DIAGNOSIS — E872 Acidosis, unspecified: Secondary | ICD-10-CM | POA: Diagnosis present

## 2023-06-07 DIAGNOSIS — R531 Weakness: Secondary | ICD-10-CM

## 2023-06-07 DIAGNOSIS — N179 Acute kidney failure, unspecified: Secondary | ICD-10-CM | POA: Diagnosis present

## 2023-06-07 DIAGNOSIS — A419 Sepsis, unspecified organism: Principal | ICD-10-CM | POA: Diagnosis present

## 2023-06-07 DIAGNOSIS — N1832 Chronic kidney disease, stage 3b: Secondary | ICD-10-CM | POA: Diagnosis not present

## 2023-06-07 DIAGNOSIS — Z1152 Encounter for screening for COVID-19: Secondary | ICD-10-CM | POA: Diagnosis not present

## 2023-06-07 DIAGNOSIS — R6521 Severe sepsis with septic shock: Secondary | ICD-10-CM | POA: Diagnosis present

## 2023-06-07 DIAGNOSIS — R571 Hypovolemic shock: Secondary | ICD-10-CM | POA: Diagnosis present

## 2023-06-07 DIAGNOSIS — Z79899 Other long term (current) drug therapy: Secondary | ICD-10-CM

## 2023-06-07 DIAGNOSIS — I130 Hypertensive heart and chronic kidney disease with heart failure and stage 1 through stage 4 chronic kidney disease, or unspecified chronic kidney disease: Secondary | ICD-10-CM | POA: Diagnosis present

## 2023-06-07 DIAGNOSIS — Z515 Encounter for palliative care: Secondary | ICD-10-CM | POA: Diagnosis not present

## 2023-06-07 DIAGNOSIS — T451X5A Adverse effect of antineoplastic and immunosuppressive drugs, initial encounter: Secondary | ICD-10-CM | POA: Diagnosis not present

## 2023-06-07 DIAGNOSIS — I5032 Chronic diastolic (congestive) heart failure: Secondary | ICD-10-CM | POA: Diagnosis not present

## 2023-06-07 DIAGNOSIS — I5023 Acute on chronic systolic (congestive) heart failure: Secondary | ICD-10-CM | POA: Diagnosis present

## 2023-06-07 DIAGNOSIS — E785 Hyperlipidemia, unspecified: Secondary | ICD-10-CM | POA: Diagnosis present

## 2023-06-07 DIAGNOSIS — I5022 Chronic systolic (congestive) heart failure: Secondary | ICD-10-CM | POA: Diagnosis present

## 2023-06-07 DIAGNOSIS — R6520 Severe sepsis without septic shock: Secondary | ICD-10-CM | POA: Diagnosis present

## 2023-06-07 DIAGNOSIS — D631 Anemia in chronic kidney disease: Secondary | ICD-10-CM | POA: Diagnosis present

## 2023-06-07 DIAGNOSIS — C9200 Acute myeloblastic leukemia, not having achieved remission: Secondary | ICD-10-CM | POA: Diagnosis present

## 2023-06-07 DIAGNOSIS — D61818 Other pancytopenia: Secondary | ICD-10-CM | POA: Diagnosis present

## 2023-06-07 DIAGNOSIS — Z87440 Personal history of urinary (tract) infections: Secondary | ICD-10-CM

## 2023-06-07 DIAGNOSIS — I5030 Unspecified diastolic (congestive) heart failure: Secondary | ICD-10-CM | POA: Diagnosis present

## 2023-06-07 DIAGNOSIS — A4150 Gram-negative sepsis, unspecified: Secondary | ICD-10-CM | POA: Diagnosis not present

## 2023-06-07 DIAGNOSIS — N189 Chronic kidney disease, unspecified: Secondary | ICD-10-CM | POA: Diagnosis present

## 2023-06-07 DIAGNOSIS — D649 Anemia, unspecified: Secondary | ICD-10-CM | POA: Diagnosis present

## 2023-06-07 DIAGNOSIS — Z8249 Family history of ischemic heart disease and other diseases of the circulatory system: Secondary | ICD-10-CM

## 2023-06-07 DIAGNOSIS — Z9049 Acquired absence of other specified parts of digestive tract: Secondary | ICD-10-CM

## 2023-06-07 LAB — CBC WITH DIFFERENTIAL/PLATELET
Abs Immature Granulocytes: 0.13 10*3/uL — ABNORMAL HIGH (ref 0.00–0.07)
Abs Immature Granulocytes: 0.39 10*3/uL — ABNORMAL HIGH (ref 0.00–0.07)
Basophils Absolute: 0 10*3/uL (ref 0.0–0.1)
Basophils Absolute: 0 10*3/uL (ref 0.0–0.1)
Basophils Relative: 0 %
Basophils Relative: 0 %
Eosinophils Absolute: 0 10*3/uL (ref 0.0–0.5)
Eosinophils Absolute: 0 10*3/uL (ref 0.0–0.5)
Eosinophils Relative: 0 %
Eosinophils Relative: 0 %
HCT: 18.8 % — ABNORMAL LOW (ref 39.0–52.0)
HCT: 19.7 % — ABNORMAL LOW (ref 39.0–52.0)
Hemoglobin: 5.7 g/dL — ABNORMAL LOW (ref 13.0–17.0)
Hemoglobin: 6.3 g/dL — ABNORMAL LOW (ref 13.0–17.0)
Immature Granulocytes: 2 %
Immature Granulocytes: 3 %
Lymphocytes Relative: 16 %
Lymphocytes Relative: 11 %
Lymphs Abs: 1.3 10*3/uL (ref 0.7–4.0)
Lymphs Abs: 1.7 10*3/uL (ref 0.7–4.0)
MCH: 28.4 pg (ref 26.0–34.0)
MCH: 28.5 pg (ref 26.0–34.0)
MCHC: 30.3 g/dL (ref 30.0–36.0)
MCHC: 32 g/dL (ref 30.0–36.0)
MCV: 93.5 fL (ref 80.0–100.0)
MCV: 89.1 fL (ref 80.0–100.0)
Monocytes Absolute: 1 10*3/uL (ref 0.1–1.0)
Monocytes Absolute: 1.4 10*3/uL — ABNORMAL HIGH (ref 0.1–1.0)
Monocytes Relative: 13 %
Monocytes Relative: 9 %
Neutro Abs: 5.7 10*3/uL (ref 1.7–7.7)
Neutro Abs: 12 10*3/uL — ABNORMAL HIGH (ref 1.7–7.7)
Neutrophils Relative %: 69 %
Neutrophils Relative %: 77 %
Platelets: 21 10*3/uL — CL (ref 150–400)
Platelets: 20 10*3/uL — CL (ref 150–400)
RBC: 2.01 MIL/uL — ABNORMAL LOW (ref 4.22–5.81)
RBC: 2.21 MIL/uL — ABNORMAL LOW (ref 4.22–5.81)
RDW: 16.3 % — ABNORMAL HIGH (ref 11.5–15.5)
RDW: 17.6 % — ABNORMAL HIGH (ref 11.5–15.5)
Smear Review: DECREASED
Smear Review: DECREASED
WBC Morphology: INCREASED
WBC Morphology: INCREASED
WBC: 8.1 10*3/uL (ref 4.0–10.5)
WBC: 15.4 10*3/uL — ABNORMAL HIGH (ref 4.0–10.5)
nRBC: 0.2 % (ref 0.0–0.2)
nRBC: 0.1 % (ref 0.0–0.2)

## 2023-06-07 LAB — TYPE AND SCREEN
ABO/RH(D): AB POS
Antibody Screen: NEGATIVE
Unit division: 0

## 2023-06-07 LAB — COMPREHENSIVE METABOLIC PANEL WITH GFR
ALT: 12 U/L (ref 0–44)
AST: 27 U/L (ref 15–41)
Albumin: 1.7 g/dL — ABNORMAL LOW (ref 3.5–5.0)
Alkaline Phosphatase: 112 U/L (ref 38–126)
Anion gap: 11 (ref 5–15)
BUN: 58 mg/dL — ABNORMAL HIGH (ref 8–23)
CO2: 17 mmol/L — ABNORMAL LOW (ref 22–32)
Calcium: 8 mg/dL — ABNORMAL LOW (ref 8.9–10.3)
Chloride: 112 mmol/L — ABNORMAL HIGH (ref 98–111)
Creatinine, Ser: 3.84 mg/dL — ABNORMAL HIGH (ref 0.61–1.24)
GFR, Estimated: 15 mL/min — ABNORMAL LOW (ref >60)
Glucose, Bld: 104 mg/dL — ABNORMAL HIGH (ref 70–99)
Potassium: 3.8 mmol/L (ref 3.5–5.1)
Sodium: 140 mmol/L (ref 135–145)
Total Bilirubin: 0.8 mg/dL (ref 0.0–1.2)
Total Protein: 6.7 g/dL (ref 6.5–8.1)

## 2023-06-07 LAB — BLOOD GAS, ARTERIAL
Acid-base deficit: 9.2 mmol/L — ABNORMAL HIGH (ref 0.0–2.0)
Bicarbonate: 15.1 mmol/L — ABNORMAL LOW (ref 20.0–28.0)
O2 Saturation: 99 %
Patient temperature: 37
pCO2 arterial: 28 mmHg — ABNORMAL LOW (ref 32–48)
pH, Arterial: 7.34 — ABNORMAL LOW (ref 7.35–7.45)
pO2, Arterial: 81 mmHg — ABNORMAL LOW (ref 83–108)

## 2023-06-07 LAB — LACTIC ACID, PLASMA
Lactic Acid, Venous: 3.6 mmol/L (ref 0.5–1.9)
Lactic Acid, Venous: 3.4 mmol/L (ref 0.5–1.9)
Lactic Acid, Venous: 2.3 mmol/L (ref 0.5–1.9)
Lactic Acid, Venous: 1.6 mmol/L (ref 0.5–1.9)

## 2023-06-07 LAB — BASIC METABOLIC PANEL WITH GFR
Anion gap: 10 (ref 5–15)
Anion gap: 12 (ref 5–15)
BUN: 53 mg/dL — ABNORMAL HIGH (ref 8–23)
BUN: 53 mg/dL — ABNORMAL HIGH (ref 8–23)
CO2: 18 mmol/L — ABNORMAL LOW (ref 22–32)
CO2: 16 mmol/L — ABNORMAL LOW (ref 22–32)
Calcium: 7.3 mg/dL — ABNORMAL LOW (ref 8.9–10.3)
Calcium: 7.3 mg/dL — ABNORMAL LOW (ref 8.9–10.3)
Chloride: 112 mmol/L — ABNORMAL HIGH (ref 98–111)
Chloride: 112 mmol/L — ABNORMAL HIGH (ref 98–111)
Creatinine, Ser: 3.77 mg/dL — ABNORMAL HIGH (ref 0.61–1.24)
Creatinine, Ser: 3.68 mg/dL — ABNORMAL HIGH (ref 0.61–1.24)
GFR, Estimated: 16 mL/min — ABNORMAL LOW (ref >60)
GFR, Estimated: 16 mL/min — ABNORMAL LOW (ref >60)
Glucose, Bld: 127 mg/dL — ABNORMAL HIGH (ref 70–99)
Glucose, Bld: 143 mg/dL — ABNORMAL HIGH (ref 70–99)
Potassium: 3.6 mmol/L (ref 3.5–5.1)
Potassium: 4 mmol/L (ref 3.5–5.1)
Sodium: 140 mmol/L (ref 135–145)
Sodium: 140 mmol/L (ref 135–145)

## 2023-06-07 LAB — GLUCOSE, CAPILLARY
Glucose-Capillary: 112 mg/dL — ABNORMAL HIGH (ref 70–99)
Glucose-Capillary: 129 mg/dL — ABNORMAL HIGH (ref 70–99)
Glucose-Capillary: 132 mg/dL — ABNORMAL HIGH (ref 70–99)
Glucose-Capillary: 139 mg/dL — ABNORMAL HIGH (ref 70–99)

## 2023-06-07 LAB — PHOSPHORUS: Phosphorus: 4.7 mg/dL — ABNORMAL HIGH (ref 2.5–4.6)

## 2023-06-07 LAB — BPAM RBC
Blood Product Expiration Date: 202505112359
ISSUE DATE / TIME: 202504151325
Unit Type and Rh: 6200

## 2023-06-07 LAB — BLOOD GAS, VENOUS
Acid-base deficit: 5.1 mmol/L — ABNORMAL HIGH (ref 0.0–2.0)
Bicarbonate: 18.8 mmol/L — ABNORMAL LOW (ref 20.0–28.0)
O2 Saturation: 99.1 %
Patient temperature: 37
pCO2, Ven: 31 mmHg — ABNORMAL LOW (ref 44–60)
pH, Ven: 7.39 (ref 7.25–7.43)
pO2, Ven: 113 mmHg — ABNORMAL HIGH (ref 32–45)

## 2023-06-07 LAB — PREPARE RBC (CROSSMATCH)

## 2023-06-07 LAB — TROPONIN I (HIGH SENSITIVITY): Troponin I (High Sensitivity): 12 ng/L (ref <18)

## 2023-06-07 LAB — URINALYSIS, W/ REFLEX TO CULTURE (INFECTION SUSPECTED)
Bilirubin Urine: NEGATIVE
Glucose, UA: NEGATIVE mg/dL
Ketones, ur: NEGATIVE mg/dL
Nitrite: NEGATIVE
Protein, ur: 30 mg/dL — AB
Specific Gravity, Urine: 1.008 (ref 1.005–1.030)
Squamous Epithelial / HPF: 0 /HPF (ref 0–5)
pH: 6 (ref 5.0–8.0)

## 2023-06-07 LAB — RESP PANEL BY RT-PCR (RSV, FLU A&B, COVID)  RVPGX2
Influenza A by PCR: NEGATIVE
Influenza B by PCR: NEGATIVE
Resp Syncytial Virus by PCR: NEGATIVE
SARS Coronavirus 2 by RT PCR: NEGATIVE

## 2023-06-07 LAB — MAGNESIUM: Magnesium: 1.3 mg/dL — ABNORMAL LOW (ref 1.7–2.4)

## 2023-06-07 LAB — PROTIME-INR
INR: 1.4 — ABNORMAL HIGH (ref 0.8–1.2)
Prothrombin Time: 17.4 s — ABNORMAL HIGH (ref 11.4–15.2)

## 2023-06-07 MED ORDER — METRONIDAZOLE 500 MG/100ML IV SOLN
500.0000 mg | Freq: Once | INTRAVENOUS | Status: DC
  Filled 2023-06-07: qty 100

## 2023-06-07 MED ORDER — SODIUM CHLORIDE 0.9 % IV SOLN
1.0000 g | Freq: Two times a day (BID) | INTRAVENOUS | Status: DC
  Administered 2023-06-07 – 2023-06-12 (×10): 1 g via INTRAVENOUS
  Filled 2023-06-07 (×10): qty 20

## 2023-06-07 MED ORDER — SODIUM CHLORIDE 0.9 % IV SOLN
2.0000 g | Freq: Once | INTRAVENOUS | Status: DC
  Administered 2023-06-07: 2 g via INTRAVENOUS
  Filled 2023-06-07: qty 12.5

## 2023-06-07 MED ORDER — SODIUM CHLORIDE 0.9 % IV BOLUS
500.0000 mL | Freq: Once | INTRAVENOUS | Status: AC
  Administered 2023-06-07: 500 mL via INTRAVENOUS

## 2023-06-07 MED ORDER — INSULIN ASPART 100 UNIT/ML IJ SOLN: 0.0000 [IU] | INTRAMUSCULAR | Status: DC

## 2023-06-07 MED ORDER — MAGNESIUM SULFATE 2 GM/50ML IV SOLN
2.0000 g | Freq: Once | INTRAVENOUS | Status: AC
  Administered 2023-06-07: 2 g via INTRAVENOUS
  Filled 2023-06-07: qty 50

## 2023-06-07 MED ORDER — NOREPINEPHRINE 4 MG/250ML-% IV SOLN
0.0000 ug/min | INTRAVENOUS | Status: DC
  Administered 2023-06-07: 13 ug/min via INTRAVENOUS
  Administered 2023-06-07: 2 ug/min via INTRAVENOUS
  Filled 2023-06-07 (×2): qty 250

## 2023-06-07 MED ORDER — CHLORHEXIDINE GLUCONATE CLOTH 2 % EX PADS: 6.0000 | MEDICATED_PAD | Freq: Every day | CUTANEOUS | Status: DC

## 2023-06-07 MED ORDER — ORAL CARE MOUTH RINSE: 15.0000 mL | OROMUCOSAL | Status: DC | PRN

## 2023-06-07 MED ORDER — SODIUM CHLORIDE 0.9 % IV SOLN: Freq: Once | INTRAVENOUS | Status: AC

## 2023-06-07 MED ORDER — SODIUM BICARBONATE 8.4 % IV SOLN
50.0000 meq | Freq: Once | INTRAVENOUS | Status: AC
  Administered 2023-06-07: 50 meq via INTRAVENOUS
  Filled 2023-06-07: qty 50

## 2023-06-07 MED ORDER — SODIUM CHLORIDE 0.9 % IV SOLN: 10.0000 mL/h | Freq: Once | INTRAVENOUS | Status: DC

## 2023-06-07 MED ORDER — SODIUM CHLORIDE 0.9% IV SOLUTION: Freq: Once | INTRAVENOUS | Status: AC

## 2023-06-07 MED ORDER — SODIUM CHLORIDE 0.9 % IV SOLN
1.0000 g | Freq: Three times a day (TID) | INTRAVENOUS | Status: DC
  Administered 2023-06-07: 1 g via INTRAVENOUS
  Filled 2023-06-07: qty 20

## 2023-06-07 MED ORDER — ACETAMINOPHEN 325 MG PO TABS
650.0000 mg | ORAL_TABLET | ORAL | Status: DC | PRN
  Administered 2023-06-12 – 2023-06-14 (×5): 650 mg via ORAL
  Filled 2023-06-07 (×5): qty 2

## 2023-06-07 MED ORDER — LACTATED RINGERS IV SOLN: INTRAVENOUS | Status: DC

## 2023-06-07 MED ORDER — VANCOMYCIN HCL IN DEXTROSE 1-5 GM/200ML-% IV SOLN
1000.0000 mg | Freq: Once | INTRAVENOUS | Status: AC
  Administered 2023-06-07: 1000 mg via INTRAVENOUS
  Filled 2023-06-07: qty 200

## 2023-06-07 MED ORDER — ONDANSETRON HCL 4 MG/2ML IJ SOLN: 4.0000 mg | Freq: Four times a day (QID) | INTRAMUSCULAR | Status: DC | PRN

## 2023-06-07 MED ORDER — HYDROCORTISONE SOD SUC (PF) 100 MG IJ SOLR
100.0000 mg | Freq: Three times a day (TID) | INTRAMUSCULAR | Status: DC
  Administered 2023-06-07 – 2023-06-08 (×2): 100 mg via INTRAVENOUS
  Filled 2023-06-07 (×2): qty 2

## 2023-06-07 MED ORDER — DOCUSATE SODIUM 100 MG PO CAPS: 100.0000 mg | ORAL_CAPSULE | Freq: Two times a day (BID) | ORAL | Status: DC | PRN

## 2023-06-07 MED ORDER — SODIUM BICARBONATE 8.4 % IV SOLN
100.0000 meq | Freq: Once | INTRAVENOUS | Status: AC
  Administered 2023-06-07: 100 meq via INTRAVENOUS
  Filled 2023-06-07: qty 100

## 2023-06-07 MED ORDER — POLYETHYLENE GLYCOL 3350 17 G PO PACK: 17.0000 g | PACK | Freq: Every day | ORAL | Status: DC | PRN

## 2023-06-07 NOTE — ED Provider Notes (Signed)
 [facility] Provider Note    Event Date/Time   First MD Initiated Contact with Patient 06/07/23 [phone]     (approximate)   History   Altered Mental Status   HPI  Robert [name] is a 78 y.o. male with history of diabetes, hypertension, leukemia who presents with decreased responsiveness per [place], and staff this morning.  EMS reports his blood pressure was low upon arrival to.  Patient reports he feels well and has no complaints at this time, he is arousable.  Review of medical record demonstrates he saw his oncologist yesterday was scheduled for transfusion today because of low hemoglobin     Physical Exam   Triage Vital Signs: ED Triage Vitals [06/07/23 0741]  Encounter Vitals Group     BP      Systolic BP Percentile      Diastolic BP Percentile      Pulse      Resp      Temp      Temp src      SpO2      Weight 91.6 kg (202 lb)     Height 1.88 m (6\' 2" )     Head Circumference      Peak Flow      Pain Score 0     Pain Loc      Pain Education      Exclude from Growth Chart     Most recent vital signs: Vitals:   06/07/23 1026 06/07/23 1028  BP: (!) 103/53 (!) 105/55  Pulse: (!) 111 (!) 110  Resp: (!) 22 19  Temp:    SpO2: 100% 100%     General: Arousable, ill-appearing CV:  Good peripheral perfusion.  Resp:  Normal effort.  Clear to auscultation bilaterally Abd:  No distention.  Soft, nontender Other:  No rash   ED Results / Procedures / Treatments   Labs (all labs ordered are listed, but only abnormal results are displayed) Labs Reviewed  CBC WITH DIFFERENTIAL/PLATELET - Abnormal; Notable for the following components:      Result Value   RBC 2.01 (*)    Hemoglobin 5.7 (*)    HCT 18.8 (*)    RDW 16.3 (*)    Platelets 21 (*)    Abs Immature Granulocytes 0.13 (*)    All other components within normal limits  COMPREHENSIVE METABOLIC PANEL WITH GFR - Abnormal; Notable for the following components:   Chloride 112 (*)     CO2 17 (*)    Glucose, Bld 104 (*)    BUN 58 (*)    Creatinine, Ser 3.84 (*)    Calcium 8.0 (*)    Albumin 1.7 (*)    GFR, Estimated 15 (*)    All other components within normal limits  LACTIC ACID, PLASMA - Abnormal; Notable for the following components:   Lactic Acid, Venous 3.6 (*)    All other components within normal limits  LACTIC ACID, PLASMA - Abnormal; Notable for the following components:   Lactic Acid, Venous 3.4 (*)    All other components within normal limits  CULTURE, BLOOD (ROUTINE X 2)  CULTURE, BLOOD (ROUTINE X 2)  URINALYSIS, W/ REFLEX TO CULTURE (INFECTION SUSPECTED)  TYPE AND SCREEN  PREPARE RBC (CROSSMATCH)  TROPONIN I (HIGH SENSITIVITY)     EKG  ED ECG REPORT I, Jene Every, the attending physician, personally viewed and interpreted this ECG.  Date: 06/07/2023  Rhythm: Sinus tachycardia QRS Axis: normal Intervals: Abnormal ST/T  Wave abnormalities: Nonspecific changes Narrative Interpretation: no evidence of acute ischemia    RADIOLOGY Chest x-ray with pulmonary vascular congestion    PROCEDURES:  Critical Care performed: yes  CRITICAL CARE Performed by: Jene Every   Total critical care time: 30 minutes  Critical care time was exclusive of separately billable procedures and treating other patients.  Critical care was necessary to treat or prevent imminent or life-threatening deterioration.  Critical care was time spent personally by me on the following activities: development of treatment plan with patient and/or surrogate as well as nursing, discussions with consultants, evaluation of patient's response to treatment, examination of patient, obtaining history from patient or surrogate, ordering and performing treatments and interventions, ordering and review of laboratory studies, ordering and review of radiographic studies, pulse oximetry and re-evaluation of patient's condition.   Procedures   MEDICATIONS ORDERED IN  ED: Medications  norepinephrine (LEVOPHED) 4mg  in (0.016 mg/mL) premix infusion (14 mcg/min Intravenous Rate/Dose Change 06/07/23 1005)  lactated ringers infusion ( Intravenous New Bag/Given 06/07/23 0954)  vancomycin (VANCOCIN) IVPB 1000 mg/200 mL premix (1,000 mg Intravenous New Bag/Given 06/07/23 1029)  meropenem (MERREM) 1 g in sodium chloride 0.9 % 100 mL IVPB (0 g Intravenous Stopped 06/07/23 1038)  sodium chloride 0.9 % bolus 500 mL (has no administration in time range)  sodium chloride 0.9 % bolus 500 mL (0 mLs Intravenous Stopped 06/07/23 0956)  0.9 %  sodium chloride infusion (0 mLs Intravenous Stopped 06/07/23 0939)  0.9 %  sodium chloride infusion (0 mLs Intravenous Stopped 06/07/23 0939)     IMPRESSION / MDM / ASSESSMENT AND PLAN / ED COURSE  I reviewed the triage vital signs and the nursing notes. Patient's presentation is most consistent with acute presentation with potential threat to life or bodily function.  Patient presents with altered mental status as detailed above, he was difficult to arouse.  He is arousable here but is ill-appearing.  Initial blood pressure reassuring however repeat demonstrates hypotension, will start with IV fluids, type and screen, labs, infectious workup  Per oncology note from yesterday, patient has very poor prognosis and patient has been urged to start hospice but has thus far refused  Differential includes severe anemia, infection, dehydration, noted elevation in creatinine over the last month  ----------------------------------------- 8:55 AM on 06/07/2023 ----------------------------------------- Patient has received 2 L of IV fluids, no significant increase in blood pressure, he seems slightly more confused now, PRBCs are pending, will start Levophed peripherally, also will obtain blood cultures, give broad-spectrum antibiotics although it is unclear whether this may be related to sepsis or not, his lactic is elevated but he has been  hypotensive and has other reasons to be hypotensive.  ----------------------------------------- 9:29 AM on 06/07/2023 ----------------------------------------- Dr. Smith Iyanni Hepp notes history of ESBL UTI in the past, will switch cefepime to carbapenem after discussion with pharmacy  ----------------------------------------- 10:58 AM on 06/07/2023 ----------------------------------------- Patient's pressures have improved on Levophed.  Discussed with ICU for admission   ED Sepsis - Repeat Assessment   Performed at:    11 AM  Last Vitals:    Blood pressure (!) 105/55, pulse (!) 110, temperature 98.5 F (36.9 C), temperature source Axillary, resp. rate 19, height 1.88 m (6\' 2" ), weight 91.6 kg, SpO2 100%.  Heart:      Tachycardia  Lungs:     Clear to auscultation  Capillary Refill:   2+  Peripheral Pulse (include location): Normal radial pulse right arm   Skin (include color):   Pink  FINAL CLINICAL IMPRESSION(S) / ED DIAGNOSES   Final diagnoses:  Acute myeloid leukemia not having achieved remission (HCC)  Altered mental status, unspecified altered mental status type  Severe anemia  Dehydration     Rx / DC Orders   ED Discharge Orders     None        Note:  This document was prepared using Dragon voice recognition software and may include unintentional dictation errors.   Bryson Carbine, MD 06/07/23 1100

## 2023-06-07 NOTE — Plan of Care (Signed)

## 2023-06-07 NOTE — ED Triage Notes (Signed)
 Pt via [facility] from [facility]. Per EMS, staff checked on him at 0645 this AM reports pt was very lethargic and unarousable. On arrival pt is now A&OX4 and NAD. Pt has hx of acute myeloblastic leukemia and iron deficiency anemia and was scheduled for a blood transfusion. 127 CBG  97.6 oral  66/38 initial BP 94% on RA 122 HR

## 2023-06-07 NOTE — ED Notes (Signed)
 RN called CCMD to put pt on monitor at this time.

## 2023-06-07 NOTE — ED Notes (Signed)
 MD made aware of platelets 21 and hemoglobin 5.7. Pt's BP 66/42. MD made aware. RN called blood bank at this time. EDT sent to get RBCs.

## 2023-06-07 NOTE — Progress Notes (Signed)
 Indwelling catheter inserted per order. Upon entry - no urine returned until after about 30 seconds and it was pinkish red with small clots, 10cc balloon filled once urine noted. Bernabe Brew, NP made aware. 200cc of urine returned after insertion remains pinkish red with minimal micro clots.

## 2023-06-07 NOTE — H&P (Signed)
 NAME:  Robert [name], MRN:  [id number], DOB:  May 04, 1945, LOS: 0 ADMISSION DATE:  06/07/2023, CONSULTATION DATE: 06/07/2023 REFERRING MD: Dr. Cyril Loosen, CHIEF COMPLAINT: Altered Mental Status    History of Present Illness:  This is a 78 yo male who presented to [facility] ER via EMS from [facility] on 04/17 with decreased responsiveness according to staff at the facility.  Per ED notes EMS reported pt hypotensive when they arrived on the scene.  Pt with hx of acute myeloblastic leukemia and iron deficiency anemia, he is currently receiving antineoplastic chemotherapy.  His most recent treatment was on 04/16.  He was scheduled to receive 1 unit of pRBC's on 04/17 for hgb of 6.2.  However, he presented to the ER prior to receiving pRBC's.    ED Course  In the ER pt alert and oriented x4.  Initial ER vital signs were: bp 66/38; hr 122; O2 sats 94% on RA; and temp 97.6 F.  Significant lab results were: CO2 17/glucose 105/BUN 56/creatinine 3.53/calcium 8.2/phos 5.8/albumin 2.1/lactic acid 3.6/hgb 6.2/platelet count 24.  CXR concerning for mild diffuse pulmonary vascular congestion.  Pt received 2.5L iv fluid bolus/vancomycin.  Pt with known hx of ESBL UTI, therefore meropenem started.  1 unit of pRBC's ordered for anemia.  Pt remained hypotensive despite aggressive iv fluid resuscitation requiring levophed gtt.  PCCM team contacted for ICU admission.     Pertinent  Medical History  Allergies  Cellulitis  Type II Diabetes Mellitus  HLD  HTN  PVD CKD Stage IIIa  Anemia of Chronic Renal Disease  Chronic HFrEF  Pericardial Effusion s/p Pericardial Window  Sigmoid Colon Cancer s/p Partial Colectomy (Nov 2024)   Significant Hospital Events: Including procedures, antibiotic start and stop dates in addition to other pertinent events   04/17: Pt admitted with acute metabolic encephalopathy, circulatory shock, severe anemia, acute renal failure superimposed on CKD stage IIIa requiring levophed gtt    Interim History / Subjective:  Pt requiring levophed gtt @12  mcg/min to maintain map 65 or higher.  Currently in no acute distress   Objective   Blood pressure (!) 105/55, pulse (!) 110, temperature 98.5 F (36.9 C), temperature source Axillary, resp. rate 19, height 6\' 2"  (1.88 m), weight 91.6 kg, SpO2 100%.       No intake or output data in the 24 hours ending 06/07/23 1102 Filed Weights   06/07/23 0741  Weight: 91.6 kg    Examination: General: Acute on chronically-ill appearing male, NAD on RA  HENT: Supple, no JVD Lungs: Clear throughout, even, non labored  Cardiovascular: Sinus tachycardia, s1s2, no m/r/g, 2+ radial/2+ distal pulses, no edema  Abdomen: +BS x4, obese, soft, non tender, non distended  Extremities: Moves all extremities, pale  Neuro: Lethargic but easily arousable, oriented, following commands, PERRLA  GU: Malewick in place   Resolved Hospital Problem list     Assessment & Plan:  #Acute toxic metabolic encephalopathy  - Correct metabolic derangements  - Avoid sedating medications as able - Maintain sleep/wake cycle  - Frequent reorientation   Shock: hypovolemic +/- septic shock  Hx: HTN, PVD, and HLD - Continuous telemetry monitoring  - Troponin negative  - Prn blood product's, prn iv resuscitation, and/or levophed gtt to maintain map 65 or higher  - Hold outpatient beta blocker for now  - Resume outpatient simvastatin once able to resume po's   #Acute kidney injury superimposed on CKD stage IIIa  #Anion gap metabolic acidosis  #Lactic acidosis  #Hypomagnesia  #Hyperphosphatemia  -  Trend BMP and lactic acid  - ABG pending  - Replace electrolytes as indicated - Strict I&O's - Avoid nephrotoxic agents as able  - Will consult nephrology   #Possible sepsis source unknown, however pt with known hx of ESBL UTI  - Trend WBC and monitor fever curve - Follow cultures  - Continue meropenum pending culture results and sensitivities   #Acute  myeloid leukemia currently receiving antineoplastic chemotherapy  #Symptomatic anemia  #Thrombocytopenia  - Trend CBC - Transfuse for hgb <7 and/or platelet count of less than 20K or active signs of bleeding with platelet count less than 50K - Monitor for s/sx of bleeding  - Oncologist notified by EDP regarding pts hospitalization   #Type II diabetes mellitus  - CBG's q4hrs  - Targets CBG range 140 to 180 - Follow hypo/hyperglycemic protocol   Pt with multiple comorbidities with overall poor prognosis.  Pt is HIGH RISK for Cardiac Arrest and Sudden Death.  Recommend changing code status to DNR/DNI.  Palliative care consulted to assist with goals of treatment  Best Practice (right click and "Reselect all SmartList Selections" daily)   Diet/type: NPO DVT prophylaxis SCD Pressure ulcer(s): N/A GI prophylaxis: N/A Lines: N/A Foley:  N/A Code Status:  full code Last date of multidisciplinary goals of care discussion [N/A]  04/17: Pt spouse updated regarding pts condition and current plan of care Labs   CBC: Recent Labs  Lab 06/06/23 1050 06/07/23 0748  WBC 4.5 8.1  NEUTROABS 1.4* 5.7  HGB 6.2* 5.7*  HCT 20.8* 18.8*  MCV 93.3 93.5  PLT 24* 21*    Basic Metabolic Panel: Recent Labs  Lab 06/06/23 1050 06/07/23 0748  NA 135 140  K 3.9 3.8  CL 108 112*  CO2 17* 17*  GLUCOSE 105* 104*  BUN 56* 58*  CREATININE 3.53* 3.84*  CALCIUM 8.2* 8.0*  PHOS 5.8*  --    GFR: Estimated Creatinine Clearance: 18.4 mL/min (A) (by C-G formula based on SCr of 3.84 mg/dL (H)). Recent Labs  Lab 06/06/23 1050 06/07/23 0748 06/07/23 1006  WBC 4.5 8.1  --   LATICACIDVEN  --  3.6* 3.4*    Liver Function Tests: Recent Labs  Lab 06/06/23 1050 06/07/23 0748  AST 16 27  ALT 9 12  ALKPHOS 90 112  BILITOT 0.7 0.8  PROT 7.6 6.7  ALBUMIN 2.1* 1.7*   No results for input(s): "LIPASE", "AMYLASE" in the last 168 hours. No results for input(s): "AMMONIA" in the last 168  hours.  ABG No results found for: "PHART", "PCO2ART", "PO2ART", "HCO3", "TCO2", "ACIDBASEDEF", "O2SAT"   Coagulation Profile: No results for input(s): "INR", "PROTIME" in the last 168 hours.  Cardiac Enzymes: No results for input(s): "CKTOTAL", "CKMB", "CKMBINDEX", "TROPONINI" in the last 168 hours.  HbA1C: HbA1c POC (<> result, manual entry)  Date/Time Value Ref Range Status  03/16/2021 02:33 PM 5.8 4.0 - 5.6 % Final   Hgb A1c MFr Bld  Date/Time Value Ref Range Status  04/03/2023 12:14 PM 5.0 4.8 - 5.6 % Final    Comment:    (NOTE) Pre diabetes:          5.7%-6.4%  Diabetes:              >6.4%  Glycemic control for   <7.0% adults with diabetes   08/10/2022 06:11 PM 5.4 4.8 - 5.6 % Final    Comment:    (NOTE) Pre diabetes:          5.7%-6.4%  Diabetes:              >  6.4%  Glycemic control for   <7.0% adults with diabetes     CBG: No results for input(s): "GLUCAP" in the last 168 hours.  Review of Systems: Positives in BOLD   Gen: Denies fever, chills, weight change, fatigue, night sweats HEENT: Denies blurred vision, double vision, hearing loss, tinnitus, sinus congestion, rhinorrhea, sore throat, neck stiffness, dysphagia PULM: Denies shortness of breath, cough, sputum production, hemoptysis, wheezing CV: Denies chest pain, edema, orthopnea, paroxysmal nocturnal dyspnea, palpitations GI: Denies abdominal pain, nausea, vomiting, diarrhea, hematochezia, melena, constipation, change in bowel habits GU: Denies dysuria, hematuria, polyuria, oliguria, urethral discharge Endocrine: Denies hot or cold intolerance, polyuria, polyphagia or appetite change Derm: Denies rash, dry skin, scaling or peeling skin change Heme: Denies easy bruising, bleeding, bleeding gums Neuro: altered mental status headache, numbness, weakness, slurred speech, loss of memory or consciousness  Past Medical History:  He,  has a past medical history of Allergy, Cellulitis, Diabetes mellitus  without complication (HCC), Hyperlipidemia, Hypertension, Leukemia (HCC), and Peripheral vascular disease (HCC).   Surgical History:   Past Surgical History:  Procedure Laterality Date   BIOPSY  08/21/2022   Procedure: BIOPSY;  Surgeon: [name], DO;  Location: [facility] ENDOSCOPY;  Service: Gastroenterology;;   COLONOSCOPY WITH PROPOFOL N/A 10/02/2017   Procedure: COLONOSCOPY WITH PROPOFOL;  Surgeon: Wyline Mood, MD;  Location: [facility] ENDOSCOPY;  Service: Gastroenterology;  Laterality: N/A;   COLONOSCOPY WITH PROPOFOL N/A 08/21/2022   Procedure: COLONOSCOPY WITH PROPOFOL;  Surgeon: [name], DO;  Location: [facility] ENDOSCOPY;  Service: Gastroenterology;  Laterality: N/A;   ESOPHAGOGASTRODUODENOSCOPY (EGD) WITH PROPOFOL N/A 08/11/2022   Procedure: ESOPHAGOGASTRODUODENOSCOPY (EGD) WITH PROPOFOL;  Surgeon: Midge Minium, MD;  Location[facility] ENDOSCOPY;  Service: Endoscopy;  Laterality: N/A;   HERNIA REPAIR     IR BONE MARROW BIOPSY & ASPIRATION  04/10/2023   PERICARDIOCENTESIS N/A 08/29/2022   Procedure: PERICARDIOCENTESIS;  Surgeon: Yvonne Kendall, MD;  Location: [facility] INVASIVE CV LAB;  Service: Cardiovascular;  Laterality: N/A;   POLYPECTOMY  08/21/2022   Procedure: POLYPECTOMY;  Surgeon: [name], DO;  Location: [facility] ENDOSCOPY;  Service: Gastroenterology;;     Social History:   reports that he has never smoked. He has never used smokeless tobacco. He reports that he does not currently use alcohol. He reports that he does not use drugs.   Family History:  His family history includes Heart attack in his father; Heart disease in his father and mother; Stroke in his mother.   Allergies No Known Allergies   Home Medications  Prior to Admission medications   Medication Sig Start Date End Date Taking? Authorizing Provider  acyclovir (ZOVIRAX) 400 MG tablet Take 1 tablet (400 mg total) by mouth 2 (two) times daily. 05/09/23   Creig Hines, MD  allopurinol (ZYLOPRIM) 300  MG tablet Take 1 tablet (300 mg total) by mouth daily. 05/01/23   Alinda Dooms, NP  BD PEN NEEDLE NANO 2ND GEN 32G X 4 MM MISC USE 1 PEN NEEDLE EVERY DAY AS DIRECTED 01/21/21   Corky Downs, MD  DULoxetine (CYMBALTA) 60 MG capsule Take 1 capsule (60 mg total) by mouth 2 (two) times daily. 04/12/23   Sunnie Nielsen, DO  ferrous sulfate 325 (65 FE) MG tablet Take 1 tablet (325 mg total) by mouth 2 (two) times daily with a meal for 14 days. Patient not taking: Reported on 05/16/2023 04/12/23 05/01/23  Sunnie Nielsen, DO  hydrOXYzine (ATARAX) 25 MG tablet Take 1 tablet (25 mg total) by mouth 3 (  three) times daily as needed for itching. 04/12/23   Alexander, Natalie, DO  latanoprost (XALATAN) 0.005 % ophthalmic solution Place 1 drop into both eyes at bedtime. 04/12/23   Alexander, Natalie, DO  melatonin 5 MG TABS Take 1 tablet (5 mg total) by mouth at bedtime. 04/12/23   Alexander, Natalie, DO  metoprolol succinate (TOPROL-XL) 50 MG 24 hr tablet Take 1 tablet (50 mg total) by mouth daily. Take with or immediately following a meal. 04/12/23   Alexander, Natalie, DO  mirtazapine (REMERON) 15 MG tablet Take 1 tablet (15 mg total) by mouth daily. 04/12/23   Alexander, Natalie, DO  omeprazole (PRILOSEC) 20 MG capsule Take 20 mg by mouth 2 (two) times daily.    [provider]  ondansetron (ZOFRAN) 4 MG tablet Take 1 tablet (4 mg total) by mouth every 6 (six) hours as needed for nausea. 04/12/23   Alexander, Natalie, DO  ondansetron (ZOFRAN) 8 MG tablet Take 1 tablet (8 mg total) by mouth every 8 (eight) hours as needed for nausea or vomiting. 05/01/23   Nelda Balsam, NP  oxybutynin (DITROPAN-XL) 10 MG 24 hr tablet Take 1 tablet (10 mg total) by mouth every evening. 04/12/23   Alexander, Natalie, DO  polyethylene glycol powder (GLYCOLAX/MIRALAX) 17 GM/SCOOP powder Take 17 g by mouth daily. 04/12/23   Alexander, Natalie, DO  prochlorperazine (COMPAZINE) 10 MG tablet Take 1 tablet (10 mg total) by mouth  every 6 (six) hours as needed for nausea or vomiting. 05/01/23   Nelda Balsam, NP  senna-docusate (FT STOOL SOFTENER) 8.6-50 MG tablet Take 2 tablets by mouth daily as needed for mild constipation. 04/12/23   Alexander, Natalie, DO  simvastatin (ZOCOR) 20 MG tablet Take 1 tablet (20 mg total) by mouth daily. 04/12/23   Alexander, Natalie, DO  sodium phosphate (FLEET) ENEM Place 133 mLs (1 enema total) rectally daily as needed for severe constipation. 04/12/23   Alexander, Natalie, DO     Critical care time: 51 minutes      Janey Meek, [facility]  Pulmonary/Critical Care Pager [phone] (please enter 7 digits) PCCM Consult Pager [phone] (please enter 7 digits)

## 2023-06-07 NOTE — ED Notes (Signed)
 MD made aware BP 71/48 at this time.

## 2023-06-07 NOTE — Consult Note (Signed)
 Pharmacy Consult Note - Electrolytes  Sodium (mmol/L)  Date Value  06/07/2023 140   Potassium (mmol/L)  Date Value  06/07/2023 3.8   Magnesium (mg/dL)  Date Value  16/11/9602 1.9   Calcium (mg/dL)  Date Value  54/10/8117 8.0 (L)   Albumin (g/dL)  Date Value  14/78/2956 1.7 (L)   Phosphorus (mg/dL)  Date Value  21/30/8657 5.8 (H)   Corrected Ca: 9.8 mg/dL  ASSESSMENT: 78 y.o. male with PMH including AML, IDA, DM, HTN, ESBL UTI, CKD who presents from [facility] found patient with decreased responsiveness this morning. He is hypotensive with elevated lactic acid, as well as anemic with Hgb 5.7 with 1 unit of pRBC ordered. Pharmacy has been consulted to monitor and replace electrolytes. Patient's renal function is currently impaired, with Scr 3.84 up from baseline of 2.25 in 04/2023.  Lab Results  Component Value Date   CREATININE 3.84 (H) 06/07/2023   CREATININE 3.53 (H) 06/06/2023   CREATININE 3.40 (H) 05/30/2023    mIVF: LR @ 150 mL/hr  Pertinent medications: N/A  Goal of Therapy:  [x]  Electrolytes WNL []  K >=4.0 and Mg >=2.0  PLAN: No electrolyte replacement currently warranted Check BMP, Mg with next AM labs   Thank you for allowing pharmacy to be a part of this patient's care.  Will M. Alva Jewels, PharmD Clinical Pharmacist 06/07/2023 11:14 AM

## 2023-06-07 NOTE — [place] Notes (Signed)
 PHARMACY NOTE:  ANTIMICROBIAL RENAL DOSAGE ADJUSTMENT  Current antimicrobial regimen includes a mismatch between antimicrobial dosage and estimated renal function.  As per policy approved by the Pharmacy & Therapeutics and Medical Executive Committees, the antimicrobial dosage will be adjusted accordingly.  Current antimicrobial dosage:  Meropenem 1g IV q8H  Indication: UTI with hx ESBL   Renal Function: Estimated Creatinine Clearance: 18.4 mL/min (A) (by C-G formula based on SCr of 3.84 mg/dL (H)).     Antimicrobial dosage has been changed to:  Meropenem 1g IV q12H  Additional comments:   Thank you for allowing pharmacy to be a part of this patient's care.  Will M. Alva Jewels, PharmD Clinical Pharmacist 06/07/2023 11:26 AM

## 2023-06-07 NOTE — ED Notes (Signed)
 MD made aware of BP 78/41 at this time.

## 2023-06-07 NOTE — ED Notes (Signed)
 Pt unable to sign blood consent form at this time due to AMS. Per EDP, pt consented for blood transfusion yesterday to be administered today.

## 2023-06-08 DIAGNOSIS — N1831 Chronic kidney disease, stage 3a: Secondary | ICD-10-CM | POA: Diagnosis not present

## 2023-06-08 DIAGNOSIS — C9200 Acute myeloblastic leukemia, not having achieved remission: Secondary | ICD-10-CM | POA: Diagnosis not present

## 2023-06-08 DIAGNOSIS — R579 Shock, unspecified: Secondary | ICD-10-CM

## 2023-06-08 DIAGNOSIS — N179 Acute kidney failure, unspecified: Secondary | ICD-10-CM | POA: Diagnosis not present

## 2023-06-08 DIAGNOSIS — G928 Other toxic encephalopathy: Secondary | ICD-10-CM | POA: Diagnosis not present

## 2023-06-08 DIAGNOSIS — Z515 Encounter for palliative care: Secondary | ICD-10-CM

## 2023-06-08 LAB — CBC
HCT: 20.2 % — ABNORMAL LOW (ref 39.0–52.0)
HCT: 23.8 % — ABNORMAL LOW (ref 39.0–52.0)
Hemoglobin: 6.8 g/dL — ABNORMAL LOW (ref 13.0–17.0)
Hemoglobin: 8.1 g/dL — ABNORMAL LOW (ref 13.0–17.0)
MCH: 29.1 pg (ref 26.0–34.0)
MCH: 29.1 pg (ref 26.0–34.0)
MCHC: 33.7 g/dL (ref 30.0–36.0)
MCHC: 34 g/dL (ref 30.0–36.0)
MCV: 85.6 fL (ref 80.0–100.0)
MCV: 86.3 fL (ref 80.0–100.0)
Platelets: 16 10*3/uL — CL (ref 150–400)
Platelets: 29 10*3/uL — CL (ref 150–400)
RBC: 2.34 MIL/uL — ABNORMAL LOW (ref 4.22–5.81)
RBC: 2.78 MIL/uL — ABNORMAL LOW (ref 4.22–5.81)
RDW: 17.3 % — ABNORMAL HIGH (ref 11.5–15.5)
RDW: 18.7 % — ABNORMAL HIGH (ref 11.5–15.5)
WBC: 8.1 10*3/uL (ref 4.0–10.5)
WBC: 8.2 10*3/uL (ref 4.0–10.5)
nRBC: 0 % (ref 0.0–0.2)
nRBC: 0 % (ref 0.0–0.2)

## 2023-06-08 LAB — MAGNESIUM: Magnesium: 1.7 mg/dL (ref 1.7–2.4)

## 2023-06-08 LAB — GLUCOSE, CAPILLARY
Glucose-Capillary: 150 mg/dL — ABNORMAL HIGH (ref 70–99)
Glucose-Capillary: 134 mg/dL — ABNORMAL HIGH (ref 70–99)
Glucose-Capillary: 127 mg/dL — ABNORMAL HIGH (ref 70–99)
Glucose-Capillary: 132 mg/dL — ABNORMAL HIGH (ref 70–99)
Glucose-Capillary: 90 mg/dL (ref 70–99)

## 2023-06-08 LAB — PHOSPHORUS: Phosphorus: 6 mg/dL — ABNORMAL HIGH (ref 2.5–4.6)

## 2023-06-08 LAB — BASIC METABOLIC PANEL WITH GFR
Anion gap: 10 (ref 5–15)
BUN: 57 mg/dL — ABNORMAL HIGH (ref 8–23)
CO2: 18 mmol/L — ABNORMAL LOW (ref 22–32)
Calcium: 7.2 mg/dL — ABNORMAL LOW (ref 8.9–10.3)
Chloride: 111 mmol/L (ref 98–111)
Creatinine, Ser: 3.66 mg/dL — ABNORMAL HIGH (ref 0.61–1.24)
GFR, Estimated: 16 mL/min — ABNORMAL LOW (ref >60)
Glucose, Bld: 139 mg/dL — ABNORMAL HIGH (ref 70–99)
Potassium: 3.7 mmol/L (ref 3.5–5.1)
Sodium: 139 mmol/L (ref 135–145)

## 2023-06-08 LAB — PREPARE RBC (CROSSMATCH)

## 2023-06-08 MED ORDER — SODIUM CHLORIDE 0.9% IV SOLUTION: Freq: Once | INTRAVENOUS | Status: AC

## 2023-06-08 MED ORDER — MIRTAZAPINE 15 MG PO TABS
15.0000 mg | ORAL_TABLET | Freq: Every day | ORAL | Status: DC
  Administered 2023-06-08 – 2023-06-13 (×6): 15 mg via ORAL
  Filled 2023-06-08 (×6): qty 1

## 2023-06-08 MED ORDER — HYDROCORTISONE SOD SUC (PF) 100 MG IJ SOLR: 50.0000 mg | Freq: Two times a day (BID) | INTRAMUSCULAR | Status: DC

## 2023-06-08 MED ORDER — DULOXETINE HCL 30 MG PO CPEP
60.0000 mg | ORAL_CAPSULE | Freq: Two times a day (BID) | ORAL | Status: DC
  Administered 2023-06-08 – 2023-06-14 (×12): 60 mg via ORAL
  Filled 2023-06-08 (×12): qty 2

## 2023-06-08 MED ORDER — LATANOPROST 0.005 % OP SOLN
1.0000 [drp] | Freq: Every day | OPHTHALMIC | Status: DC
  Administered 2023-06-09 – 2023-06-13 (×6): 1 [drp] via OPHTHALMIC
  Filled 2023-06-08 (×2): qty 2.5

## 2023-06-08 MED ORDER — MAGNESIUM SULFATE 2 GM/50ML IV SOLN
2.0000 g | Freq: Once | INTRAVENOUS | Status: AC
  Administered 2023-06-08: 2 g via INTRAVENOUS
  Filled 2023-06-08: qty 50
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
## 2023-06-08 NOTE — Progress Notes (Signed)
 [facility]  ROUNDING NOTE   Subjective:   Mr. COBIN CADAVID was admitted to [facility] on 06/07/2023 for Dehydration [E86.0] Shock circulatory (HCC) [R57.9] Severe anemia [D64.9] Altered mental status, unspecified altered mental status type [R41.82] Acute myeloid leukemia not having achieved remission [facility]) [C92.00]  Patient was admitted to [facility] from 3/26 to 4/4 for acute kidney injury thought to be secondary to chemotherapy or antibiotics. He was discharged with a creatinine of 3.47 and GFR of 17.   Patient admitted from Skilled Nursing Facility with decreased responsiveness. Patient was found to have a creatinine of 3.84 with GFR of 15. He was treated with IV fluids. Kidney function is stable with creatinine of 3.66 and GFR of 16   Objective:  Vital signs in last 24 hours:  Temp:  [96.8 F (36 C)-99.6 F (37.6 C)] 98 F (36.7 C) (04/18 1200) Pulse Rate:  [68-120] 91 (04/18 1300) Resp:  [9-25] 17 (04/18 1300) BP: (97-150)/(49-101) 121/77 (04/18 1300) SpO2:  [94 %-100 %] 100 % (04/18 1300) Weight:  [97.7 kg] 97.7 kg (04/18 0422)  Weight change:  Filed Weights   06/07/23 0741 06/08/23 0422  Weight: 91.6 kg 97.7 kg    Intake/Output: I/O last 3 completed shifts: In: 2435 [I.V.:595.3; Blood:1739.6; IV Piggyback:100.1] Out: 900 [Urine:900]   Intake/Output this shift:  Total I/O In: 150 [IV Piggyback:150] Out: 150 [Urine:150]  Physical Exam: General: NAD, laying in bed  Head: Normocephalic, atraumatic. Moist oral mucosal membranes  Eyes: Anicteric  Lungs:  Clear to auscultation, normal effort  Heart: Regular rate and rhythm  Abdomen:  Soft, tenderness,   Extremities: No peripheral edema.  Neurologic: Alert and oriented, moving all four extremities  Skin: No lesions       Basic Metabolic Panel: Recent Labs  Lab 06/06/23 1050 06/07/23 0748 06/07/23 1355 06/07/23 1750 06/08/23 0714  NA 135 140 140 140 139  K 3.9 3.8 3.6 4.0 3.7  CL 108 112* 112*  112* 111  CO2 17* 17* 18* 16* 18*  GLUCOSE 105* 104* 127* 143* 139*  BUN 56* 58* 53* 53* 57*  CREATININE 3.53* 3.84* 3.77* 3.68* 3.66*  CALCIUM 8.2* 8.0* 7.3* 7.3* 7.2*  MG  --  1.3*  --   --  1.7  PHOS 5.8* 4.7*  --   --  6.0*    Liver Function Tests: Recent Labs  Lab 06/06/23 1050 06/07/23 0748  AST 16 27  ALT 9 12  ALKPHOS 90 112  BILITOT 0.7 0.8  PROT 7.6 6.7  ALBUMIN  2.1* 1.7*   No results for input(s): "LIPASE", "AMYLASE" in the last 168 hours. No results for input(s): "AMMONIA" in the last 168 hours.  CBC: Recent Labs  Lab 06/06/23 1050 06/07/23 0748 06/07/23 1355 06/08/23 0031 06/08/23 0714  WBC 4.5 8.1 15.4* 8.1 8.2  NEUTROABS 1.4* 5.7 12.0*  --   --   HGB 6.2* 5.7* 6.3* 6.8* 8.1*  HCT 20.8* 18.8* 19.7* 20.2* 23.8*  MCV 93.3 93.5 89.1 86.3 85.6  PLT 24* 21* 20* 16* 29*    Cardiac Enzymes: No results for input(s): "CKTOTAL", "CKMB", "CKMBINDEX", "TROPONINI" in the last 168 hours.   BNP: Invalid input(s): "POCBNP"  CBG: Recent Labs  Lab 06/07/23 1911 06/07/23 2343 06/08/23 0349 06/08/23 0724 06/08/23 1209  GLUCAP 132* 139* 150* 134* 127*    Microbiology: Results for orders placed or performed during the hospital encounter of 06/07/23  Blood culture (routine x 2)     Status: None (Preliminary result)  Collection Time: 06/07/23  7:48 AM   Specimen: BLOOD  Result Value Ref Range Status   Specimen Description BLOOD BLOOD RIGHT ARM  Final   Special Requests   Final    BOTTLES DRAWN AEROBIC AND ANAEROBIC Blood Culture results may not be optimal due to an inadequate volume of blood received in culture bottles   Culture   Final    NO GROWTH < 24 HOURS Performed at [facility], [address], [place], [place] [address]    Report Status PENDING  Incomplete  Blood culture (routine x 2)     Status: None (Preliminary result)   Collection Time: 06/07/23  8:05 AM   Specimen: BLOOD  Result Value Ref Range Status   Specimen Description  BLOOD BLOOD LEFT ARM  Final   Special Requests   Final    BOTTLES DRAWN AEROBIC AND ANAEROBIC Blood Culture results may not be optimal due to an inadequate volume of blood received in culture bottles   Culture   Final    NO GROWTH < 24 HOURS Performed at [facility], [address], [place], [place] [address]    Report Status PENDING  Incomplete  Urine Culture     Status: Abnormal (Preliminary result)   Collection Time: 06/07/23 10:06 AM   Specimen: Urine, Random  Result Value Ref Range Status   Specimen Description   Final    URINE, RANDOM Performed at [facility], [address], [place], [place] [address]    Special Requests   Final    NONE Reflexed from [id number] Performed at [facility], [address], [place], [place] [address]    Culture (A)  Final    >=100,000 COLONIES/mL KLEBSIELLA PNEUMONIAE SUSCEPTIBILITIES TO FOLLOW Performed at [facility], [address] [place], [place] [address]    Report Status PENDING  Incomplete  Resp panel by RT-PCR (RSV, Flu A&B, Covid) Anterior Nasal Swab     Status: None   Collection Time: 06/07/23 11:56 AM   Specimen: Anterior Nasal Swab  Result Value Ref Range Status   SARS Coronavirus 2 by RT PCR NEGATIVE NEGATIVE Final    Comment: (NOTE) SARS-CoV-2 target nucleic acids are NOT DETECTED.  The SARS-CoV-2 RNA is generally detectable in upper respiratory specimens during the acute phase of infection. The lowest concentration of SARS-CoV-2 viral copies this assay can detect is 138 copies/mL. A negative result does not preclude SARS-Cov-2 infection and should not be used as the sole basis for treatment or other patient management decisions. A negative result may occur with  improper specimen collection/handling, submission of specimen other than nasopharyngeal swab, presence of viral mutation(s) within the areas targeted by this assay, and inadequate number of viral copies(<138 copies/mL). A  negative result must be combined with clinical observations, patient history, and epidemiological information. The expected result is Negative.  Fact Sheet for Patients:  BloggerCourse.com  Fact Sheet for Healthcare Providers:  SeriousBroker.it  This test is no t yet approved or cleared by the [place]  FDA and  has been authorized for detection and/or diagnosis of SARS-CoV-2 by FDA under an Emergency Use Authorization (EUA). This EUA will remain  in effect (meaning this test can be used) for the duration of the COVID-19 declaration under Section 564(b)(1) of the Act, 21 U.S.C.section 360bbb-3(b)(1), unless the authorization is terminated  or revoked sooner.       Influenza A by PCR NEGATIVE NEGATIVE Final   Influenza B by PCR NEGATIVE NEGATIVE Final  Comment: (NOTE) The Xpert Xpress SARS-CoV-2/FLU/RSV plus assay is intended as an aid in the diagnosis of influenza from Nasopharyngeal swab specimens and should not be used as a sole basis for treatment. Nasal washings and aspirates are unacceptable for Xpert Xpress SARS-CoV-2/FLU/RSV testing.  Fact Sheet for Patients: BloggerCourse.com  Fact Sheet for Healthcare Providers: SeriousBroker.it  This test is not yet approved or cleared by the [place]  FDA and has been authorized for detection and/or diagnosis of SARS-CoV-2 by FDA under an Emergency Use Authorization (EUA). This EUA will remain in effect (meaning this test can be used) for the duration of the COVID-19 declaration under Section 564(b)(1) of the Act, 21 U.S.C. section 360bbb-3(b)(1), unless the authorization is terminated or revoked.     Resp Syncytial Virus by PCR NEGATIVE NEGATIVE Final    Comment: (NOTE) Fact Sheet for Patients: BloggerCourse.com  Fact Sheet for Healthcare  Providers: SeriousBroker.it  This test is not yet approved or cleared by the [place]  FDA and has been authorized for detection and/or diagnosis of SARS-CoV-2 by FDA under an Emergency Use Authorization (EUA). This EUA will remain in effect (meaning this test can be used) for the duration of the COVID-19 declaration under Section 564(b)(1) of the Act, 21 U.S.C. section 360bbb-3(b)(1), unless the authorization is terminated or revoked.  Performed at [facility], [address] [place], [place] [address]     Coagulation Studies: Recent Labs    06/07/23 1355  LABPROT 17.4*  INR 1.4*    Urinalysis: Recent Labs    06/07/23 1006  COLORURINE YELLOW*  LABSPEC 1.008  PHURINE 6.0  GLUCOSEU NEGATIVE  HGBUR MODERATE*  BILIRUBINUR NEGATIVE  KETONESUR NEGATIVE  PROTEINUR 30*  NITRITE NEGATIVE  LEUKOCYTESUR LARGE*      Imaging: DG Chest Port 1 View Result Date: 06/07/2023 CLINICAL DATA:  Weakness. EXAM: PORTABLE CHEST 1 VIEW COMPARISON:  04/04/2023. FINDINGS: Low lung volume. Mild diffuse pulmonary vascular congestion, likely accentuated by low lung volume, grossly similar to the prior study. There are bilateral nonspecific parenchymal opacities, which are grossly similar to the prior study and may represent underlying scarring/atelectasis. No acute consolidation or lung collapse. Bilateral lateral costophrenic angles are clear. Stable cardio-mediastinal silhouette. No acute osseous abnormalities. The soft tissues are within normal limits. IMPRESSION: *Mild diffuse pulmonary vascular congestion, likely accentuated by low lung volume. No acute consolidation or lung collapse. Electronically Signed   By: Beula Brunswick M.D.   On: 06/07/2023 08:47     Medications:    meropenem  (MERREM ) IV 200 mL/hr at 06/08/23 1300    Chlorhexidine  Gluconate Cloth  6 each Topical Daily   DULoxetine   60 mg Oral BID   insulin  aspart  0-6 Units Subcutaneous Q4H    latanoprost   1 drop Both Eyes QHS   mirtazapine   15 mg Oral QHS   acetaminophen , docusate sodium , ondansetron  (ZOFRAN ) IV, mouth rinse, polyethylene glycol  Assessment/ Plan:  Mr. JAVION HOLMER is a 78 y.o.  male with medical problems of acute myeloid leukoma, history of sigmoid colon cancer status post partial colectomy in November 2024, anemia, diabetes mellitus type II, peripheral arterial disease, chronic systolic congestive heart failure, history of pericardial effusion status post pericardial window. was admitted on 06/07/2023  for Dehydration [E86.0] Shock circulatory (HCC) [R57.9] Severe anemia [D64.9] Altered mental status, unspecified altered mental status type [R41.82] Acute myeloid leukemia not having achieved remission (HCC) [C92.00]   Acute Kidney Injury on chronic kidney disease stage 4 with history of proteinuria. with baseline creatinine 2.25  and GFR of 29 on 05/09/2023. CT in February 2025 showed severely atrophic left kidney. - Creatinine stable today. May represent new baseline. No acute need for dialysis.     Lab Results  Component Value Date   CREATININE 3.66 (H) 06/08/2023   CREATININE 3.68 (H) 06/07/2023   CREATININE 3.77 (H) 06/07/2023    Intake/Output Summary (Last 24 hours) at 06/08/2023 1440 Last data filed at 06/08/2023 1300 Gross per 24 hour  Intake 2152.89 ml  Output 1050 ml  Net 1102.89 ml   2. Anemia of chronic kidney disease with thrombocytopenia Lab Results  Component Value Date   HGB 8.1 (L) 06/08/2023    Patient with history of AML, pancytopenia and multiple blood transfusions in the past.  Appreciate hematology input.   3.  Chronic systolic heart failure : not in acute exacerbation. - holding furosemide  due to acute kidney injury.     LOS: 1 Eudell Julian 4/18/20252:40 PM
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
## 2023-06-08 NOTE — Progress Notes (Signed)
 Patient transferred to room 125 in hospital bed with all belongings.

## 2023-06-08 NOTE — Consult Note (Signed)
 Consultation Note Date: 06/08/2023   Patient Name: Robert [name]  DOB: 01/11/1946  MRN: [id number]  Age / Sex: 78 y.o., male  PCP: Theron Flavin, MD Referring Physician: Annitta Kindler, MD  Reason for Consultation: Establishing goals of care   HPI/Brief Hospital Course: 78 y.o. male  with past medical history of T2DM, CKD stage 3a, PAD, HFrEF with EF 40%, ESBL UTI, sigmoid colon cancer s/p partial colectomy (12/2022), IDA and AML currently receiving antineoplastic chemotherapy admitted from [facility] on 06/07/2023 with decreased responsiveness and hypotension.Last chemo treatment on 4/16, scheduled for blood transfusion as outpatient due to hemoglobin of 6.2. On arrival to ED, he was found to be significantly hypotensive and a hemoglobin of 6.2.  Admitted to ICU, started on vasopressor support and received 1 unit PRBCs Quickly transitioned off of vasopressor support and hemoglobin improved to 8.1  Palliative medicine was consulted for assisting with goals of care conversations.  Subjective:  Extensive chart review has been completed prior to meeting patient including labs, vital signs, imaging, progress notes, orders, and available advanced directive documents from current and previous encounters.  Visited with Robert [name] at his bedside.  He is awake, alert, oriented and able to engage in goals of care conversations.  Introduced myself as a Publishing rights manager as a member of the palliative care team. Explained palliative medicine is specialized medical care for people living with serious illness. It focuses on providing relief from the symptoms and stress of a serious illness. The goal is to improve quality of life for both the patient and the family.   Robert [name] shares he has been a resident at [facility] for the past 3 to 4 months.  He shares he has enjoyed his time there and feels with ongoing therapy he is getting stronger with  hopes to return home to his wife.  He and his wife Diane have been married for over 30 years.  Robert [name] does not have children but Diane has children from a previous marriage.  He feels his time during the day participating with therapy, reading books and playing games on his tablet.  Assessed symptoms.  He denies acute pain or discomfort, sleeping well at night and reports an improvement over the last couple of months and his appetite and p.o. intake.  We discussed patient's current illness and what it means in the larger context of patient's on-going co-morbidities. Natural disease trajectory and expectations at EOL were discussed.   Robert [name] able to explain his understanding of current medical condition.  He reports feeling much better than when he was initially admitted.  He can appropriately explain his understanding of AML diagnosis and current treatment.  Robert [name] shares he remains hopeful and optimistic that treatment will be successful and that he will continue to improve each day.  He shares he has completed advanced directives in the past.  He shares in the past he has made the choice for DNR but currently wishes for FULL CODE.  He shares he has an understanding of the difference between the two and would be accepting of all resuscitative efforts.  He also shares he wishes to appoint his wife [name] as Runner, broadcasting/film/video.  I discussed importance of continued conversations with family/support persons and all members of their medical team regarding overall plan of care and treatment options ensuring decisions are in alignment with patients goals of care.  All questions/concerns addressed. Emotional support provided to patient/family/support persons. PMT will continue to follow and support  patient as needed.  Objective: Primary Diagnoses: Present on Admission:  Shock circulatory [facility])   Physical Exam Constitutional:      General: He is not in acute distress.     Appearance: He is not ill-appearing.  Pulmonary:     Effort: Pulmonary effort is normal. No respiratory distress.  Skin:    General: Skin is warm and dry.  Neurological:     Mental Status: He is alert and oriented to person, place, and time.  Psychiatric:        Mood and Affect: Mood normal.        Thought Content: Thought content normal.     Vital Signs: BP 121/77   Pulse 91   Temp 98 F (36.7 C) (Oral)   Resp 17   Ht 6\' 2"  (1.88 m)   Wt 97.7 kg   SpO2 100%   BMI 27.65 kg/m  Pain Scale: 0-10 POSS *See Group Information*: S-Acceptable,Sleep, easy to arouse Pain Score: 0-No pain  IO: Intake/output summary:  Intake/Output Summary (Last 24 hours) at 06/08/2023 1436 Last data filed at 06/08/2023 1300 Gross per 24 hour  Intake 2152.89 ml  Output 1050 ml  Net 1102.89 ml    LBM: Last BM Date :  (PTA) Baseline Weight: Weight: 91.6 kg Most recent weight: Weight: 97.7 kg      Assessment and Plan  SUMMARY OF RECOMMENDATIONS   Full Code-Full Scope   Palliative Prophylaxis:   Bowel Regimen, Delirium Protocol and Frequent Pain Assessment  Discussed With: Nursing staff   Thank you for this consult and allowing Palliative Medicine to participate in the care of Ardean C. Budnick. Palliative medicine will continue to follow and assist as needed.   Time Total: 75 minutes  Time spent includes: Detailed review of medical records (labs, imaging, vital signs), medically appropriate exam (mental status, respiratory, cardiac, skin), discussed with treatment team, counseling and educating patient, family and staff, documenting clinical information, medication management and coordination of care.   Signed by: Isadore Marble, DNP, AGNP-C Palliative Medicine    Please contact Palliative Medicine Team phone at [phone] for questions and concerns.  For individual provider: See Tilford Foley

## 2023-06-08 NOTE — Plan of Care (Signed)
       CROSS COVER NOTE  NAME: Robert [name] MRN: [id number] DOB : [birth year]    Concern as stated by nurse / staff   [name] was admitted to [facility] on 06/07/2023 for Dehydration [E86.0] Shock circulatory (HCC) , decreased unresponsives , now it is AO*4 , move all extremity . during day shift , they DC his foley catheter, in my shift I did assessment for this patient , I saw some blood near patient groin , he said it is from DC foley , I did Bladder scan patient , 450 unine retention , asked him if he want to get up urine , He said he did not feel yet . could you order straight catheter for him ?      Pertinent findings on chart review:   Assessment and  Interventions   Assessment:  Failed voiding trial  Plan: Will do in an out. If still retaining will replace foley  X X

## 2023-06-08 NOTE — Consult Note (Signed)
 Pharmacy Consult Note - Electrolytes  Sodium (mmol/L)  Date Value  06/07/2023 140   Potassium (mmol/L)  Date Value  06/07/2023 4.0   Magnesium  (mg/dL)  Date Value  95/82/7974 1.3 (L)   Calcium (mg/dL)  Date Value  95/82/7974 7.3 (L)   Albumin  (g/dL)  Date Value  95/82/7974 1.7 (L)   Phosphorus (mg/dL)  Date Value  95/82/7974 4.7 (H)   ASSESSMENT: 78 y.o. male with PMH including AML, IDA, DM, HTN, ESBL UTI, CKD who presents from [facility] found patient with decreased responsiveness this morning. He is hypotensive with elevated lactic acid, as well as anemic with Hgb 5.7 with 1 unit of pRBC ordered. Pharmacy has been consulted to monitor and replace electrolytes. Patient's renal function is currently impaired, with Scr 3.84 up from baseline of 2.25 in 04/2023.  Pertinent medications: N/A  Goal of Therapy:  [x]  Electrolytes WNL []  K >=4.0 and Mg >=2.0  PLAN: Mg 1.7, repeat magnesium  sulfate 2 g x 1 Check BMP, Mg with next AM labs   Thank you for allowing pharmacy to be a part of this patient's care.  Margerite Impastato B Emannuel Vise 06/08/2023 7:11 AM

## 2023-06-08 NOTE — Progress Notes (Signed)
 NAME:  Robert [name], MRN:  [id number], DOB:  1945/03/26, LOS: 1 ADMISSION DATE:  06/07/2023, CONSULTATION DATE: 06/07/2023 REFERRING MD: Dr. Martina Sledge, CHIEF COMPLAINT: Altered Mental Status    Brief Pt Description / Synopsis:  78 y.o. male with PMHx most significant for acute myeloblastic leukemia and iron  deficiency anemia, he is currently receiving antineoplastic chemotherapy (most recent treatment 04/16). He was scheduled to receive 1 unit of pRBC's on 04/17 for hgb of 6.2, however, he presented to the ER prior to receiving pRBC's due to altered mental status. Admitted with Acute Metabolic Encephalopathy, Hypovolemic/Septic shock, and Severe Sepsis due to UTI.   History of Present Illness:  This is a 78 yo male who presented to [facility] ER via EMS from [facility] on 04/17 with decreased responsiveness according to staff at the facility.  Per ED notes EMS reported pt hypotensive when they arrived on the scene.  Pt with hx of acute myeloblastic leukemia and iron  deficiency anemia, he is currently receiving antineoplastic chemotherapy.  His most recent treatment was on 04/16.  He was scheduled to receive 1 unit of pRBC's on 04/17 for hgb of 6.2.  However, he presented to the ER prior to receiving pRBC's.    ED Course  In the ER pt alert and oriented x4.  Initial ER vital signs were: bp 66/38; hr 122; O2 sats 94% on RA; and temp 97.6 F.  Significant lab results were: CO2 17/glucose 105/BUN 56/creatinine 3.53/calcium 8.2/phos 5.8/albumin  2.1/lactic acid 3.6/hgb 6.2/platelet count 24.  CXR concerning for mild diffuse pulmonary vascular congestion.  Pt received 2.5L iv fluid bolus/vancomycin .  Pt with known hx of ESBL UTI, therefore meropenem  started.  1 unit of pRBC's ordered for anemia.  Pt remained hypotensive despite aggressive iv fluid resuscitation requiring levophed  gtt.  PCCM team contacted for ICU admission.     Pertinent  Medical History  Allergies  Cellulitis  Type II Diabetes Mellitus   HLD  HTN  PVD CKD Stage IIIa  Anemia of Chronic Renal Disease  Chronic HFrEF  Pericardial Effusion s/p Pericardial Window  Sigmoid Colon Cancer s/p Partial Colectomy (Nov 2024)   Micro Data:  4/17: SARS-CoV-2/flu/RSV PCR>> negative 4/17: Blood culture x 2>> no growth to date 4/17: Urine>> gram-negative rods  Antimicrobials:   Anti-infectives (From admission, onward)    Start     Dose/Rate Route Frequency Ordered Stop   06/07/23 2200  meropenem  (MERREM ) 1 g in sodium chloride  0.9 % 100 mL IVPB        1 g 200 mL/hr over 30 Minutes Intravenous Every 12 hours 06/07/23 1124     06/07/23 0945  meropenem  (MERREM ) 1 g in sodium chloride  0.9 % 100 mL IVPB  Status:  Discontinued        1 g 200 mL/hr over 30 Minutes Intravenous Every 8 hours 06/07/23 0932 06/07/23 1124   06/07/23 0915  ceFEPIme  (MAXIPIME ) 2 g in sodium chloride  0.9 % 100 mL IVPB  Status:  Discontinued        2 g 200 mL/hr over 30 Minutes Intravenous  Once 06/07/23 0908 06/07/23 1002   06/07/23 0915  metroNIDAZOLE  (FLAGYL ) IVPB 500 mg  Status:  Discontinued        500 mg 100 mL/hr over 60 Minutes Intravenous  Once 06/07/23 0908 06/07/23 0932   06/07/23 0915  vancomycin  (VANCOCIN ) IVPB 1000 mg/200 mL premix        1,000 mg 200 mL/hr over 60 Minutes Intravenous  Once 06/07/23 0908 06/07/23 1129  Significant Hospital Events: Including procedures, antibiotic start and stop dates in addition to other pertinent events   04/17: Pt admitted with acute metabolic encephalopathy, circulatory shock, severe anemia, acute renal failure superimposed on CKD stage IIIa requiring levophed  gtt  04/18: No significant events overnight. Weaned off Levophed  last night, Hgb improved to 8.1 following transfusions. Stop stress dose steroids.  Urine culture preliminary results with Gram negative rods.  Renal function unchanged, electrolytes acceptable and UOP 900 cc last 24 hrs. Nephrology consulted.  Transfer to Med-Surg unit and transfer  service to [facility] tomorrow.  Consult PT/OT.  Interim History / Subjective:  As outlined above under "Significant Hospital Events"  Objective   Blood pressure (!) 141/99, pulse 78, temperature 98 F (36.7 C), temperature source Oral, resp. rate 13, height 6\' 2"  (1.88 m), weight 97.7 kg, SpO2 100%.        Intake/Output Summary (Last 24 hours) at 06/08/2023 5784 Last data filed at 06/08/2023 0700 Gross per 24 hour  Intake 2434.97 ml  Output 900 ml  Net 1534.97 ml   Filed Weights   06/07/23 0741 06/08/23 0422  Weight: 91.6 kg 97.7 kg    Examination: General: Acute on chronically-ill appearing male, NAD on RA  HENT: Supple, no JVD Lungs: Clear throughout, even, non labored, normal effort Cardiovascular: Regular rate and rhythm (NSR on telemetry), s1s2, no m/r/g, 2+ radial/2+ distal pulses, no edema  Abdomen: +BS x4, obese, soft, non tender, non distended  Extremities: Moves all extremities, pale  Neuro: Awake and alert, oriented x3, following commands, no focal deficits noted, speech clear, PERRLA  GU: Malewick in place   Resolved Hospital Problem list     Assessment & Plan:   #Acute toxic metabolic encephalopathy ~ IMPROVING - Treat Sepsis & metabolic derangements as outlined below  - Avoid sedating medications as able - Maintain sleep/wake cycle  - Frequent reorientation   #Shock: hypovolemic +/- septic shock ~ RESOLVED Hx: HTN, PVD, and HLD - Continuous cardiac monitoring - Maintain MAP >65 - IV fluids - Transfusions as indicated - Vasopressors as needed to maintain MAP goal ~ weaned off - Lactic acid has normalized - Troponin negative  - Hold outpatient beta blocker for now  - Resume outpatient simvastatin  once able to resume po's   #Acute kidney injury superimposed on CKD stage IIIa  #Anion gap metabolic acidosis ~ improving #Lactic acidosis  ~ resolved #Hypomagnesia  #Hyperphosphatemia  -Monitor I&O's / urinary output -Follow BMP -Ensure adequate renal  perfusion -Avoid nephrotoxic agents as able -Replace electrolytes as indicated ~ Pharmacy following for assistance with electrolyte replacement -Consult Nephrology, appreciate input  #Sepsis in setting of ... #UTI PMhx of ESBL UTI  -Monitor fever curve -Trend WBC's & Procalcitonin -Follow cultures as above -Continue empiric Meropenem  pending cultures & sensitivities  #Acute myeloid leukemia currently receiving antineoplastic chemotherapy  #Symptomatic anemia  #Thrombocytopenia  - Trend CBC - Transfuse for hgb <7 and/or platelet count of less than 10K, or active signs of bleeding with platelet count less than 50K - Monitor for s/sx of bleeding  - Oncologist notified by EDP regarding pts hospitalization   #Type II diabetes mellitus  - CBG's q4hrs  - Targets CBG range 140 to 180 - Follow hypo/hyperglycemic protocol      Pt with multiple comorbidities with overall poor prognosis.  Pt is HIGH RISK for Cardiac Arrest and Sudden Death.  Recommend changing code status to DNR/DNI.  Palliative care consulted to assist with goals of treatment    Best Practice (  right click and "Reselect all SmartList Selections" daily)   Diet/type: Heart healthy/carb modified diet DVT prophylaxis SCD (chemical ppx contraindicated due to anemia and thrombocytopenia) Pressure ulcer(s): N/A GI prophylaxis: N/A Lines: N/A Foley:  N/A Code Status:  full code Last date of multidisciplinary goals of care discussion [4/18]  04/18: Pt updated on current plan of care.  Labs   CBC: Recent Labs  Lab 06/06/23 1050 06/07/23 0748 06/07/23 1355 06/08/23 0031 06/08/23 0714  WBC 4.5 8.1 15.4* 8.1 8.2  NEUTROABS 1.4* 5.7 12.0*  --   --   HGB 6.2* 5.7* 6.3* 6.8* 8.1*  HCT 20.8* 18.8* 19.7* 20.2* 23.8*  MCV 93.3 93.5 89.1 86.3 85.6  PLT 24* 21* 20* 16* 29*    Basic Metabolic Panel: Recent Labs  Lab 06/06/23 1050 06/07/23 0748 06/07/23 1355 06/07/23 1750 06/08/23 0714  NA 135 140 140 140 139  K  3.9 3.8 3.6 4.0 3.7  CL 108 112* 112* 112* 111  CO2 17* 17* 18* 16* 18*  GLUCOSE 105* 104* 127* 143* 139*  BUN 56* 58* 53* 53* 57*  CREATININE 3.53* 3.84* 3.77* 3.68* 3.66*  CALCIUM 8.2* 8.0* 7.3* 7.3* 7.2*  MG  --  1.3*  --   --  1.7  PHOS 5.8* 4.7*  --   --  6.0*   GFR: Estimated Creatinine Clearance: 19.3 mL/min (A) (by C-G formula based on SCr of 3.66 mg/dL (H)). Recent Labs  Lab 06/07/23 0748 06/07/23 1006 06/07/23 1355 06/07/23 1528 06/08/23 0031 06/08/23 0714  WBC 8.1  --  15.4*  --  8.1 8.2  LATICACIDVEN 3.6* 3.4* 2.3* 1.6  --   --     Liver Function Tests: Recent Labs  Lab 06/06/23 1050 06/07/23 0748  AST 16 27  ALT 9 12  ALKPHOS 90 112  BILITOT 0.7 0.8  PROT 7.6 6.7  ALBUMIN  2.1* 1.7*   No results for input(s): "LIPASE", "AMYLASE" in the last 168 hours. No results for input(s): "AMMONIA" in the last 168 hours.  ABG    Component Value Date/Time   PHART 7.34 (L) 06/07/2023 1144   PCO2ART 28 (L) 06/07/2023 1144   PO2ART 81 (L) 06/07/2023 1144   HCO3 18.8 (L) 06/07/2023 2109   ACIDBASEDEF 5.1 (H) 06/07/2023 2109   O2SAT 99.1 06/07/2023 2109     Coagulation Profile: Recent Labs  Lab 06/07/23 1355  INR 1.4*    Cardiac Enzymes: No results for input(s): "CKTOTAL", "CKMB", "CKMBINDEX", "TROPONINI" in the last 168 hours.  HbA1C: HbA1c POC (<> result, manual entry)  Date/Time Value Ref Range Status  03/16/2021 02:33 PM 5.8 4.0 - 5.6 % Final   Hgb A1c MFr Bld  Date/Time Value Ref Range Status  04/03/2023 12:14 PM 5.0 4.8 - 5.6 % Final    Comment:    (NOTE) Pre diabetes:          5.7%-6.4%  Diabetes:              >6.4%  Glycemic control for   <7.0% adults with diabetes   08/10/2022 06:11 PM 5.4 4.8 - 5.6 % Final    Comment:    (NOTE) Pre diabetes:          5.7%-6.4%  Diabetes:              >6.4%  Glycemic control for   <7.0% adults with diabetes     CBG: Recent Labs  Lab 06/07/23 1544 06/07/23 1911 06/07/23 2343 06/08/23 0349  06/08/23 0724  GLUCAP 129*  132* 139* 150* 134*    Review of Systems: Positives in BOLD   Gen: Denies fever, chills, weight change, fatigue, night sweats, currently denies all complaints HEENT: Denies blurred vision, double vision, hearing loss, tinnitus, sinus congestion, rhinorrhea, sore throat, neck stiffness, dysphagia PULM: Denies shortness of breath, cough, sputum production, hemoptysis, wheezing CV: Denies chest pain, edema, orthopnea, paroxysmal nocturnal dyspnea, palpitations GI: Denies abdominal pain, nausea, vomiting, diarrhea, hematochezia, melena, constipation, change in bowel habits GU: Denies dysuria, hematuria, polyuria, oliguria, urethral discharge Endocrine: Denies hot or cold intolerance, polyuria, polyphagia or appetite change Derm: Denies rash, dry skin, scaling or peeling skin change Heme: Denies easy bruising, bleeding, bleeding gums Neuro:  headache, numbness, weakness, slurred speech, loss of memory or consciousness  Past Medical History:  He,  has a past medical history of Allergy, Cellulitis, Diabetes mellitus without complication (HCC), Hyperlipidemia, Hypertension, Leukemia (HCC), and Peripheral vascular disease (HCC).   Surgical History:   Past Surgical History:  Procedure Laterality Date   BIOPSY  08/21/2022   Procedure: BIOPSY;  Surgeon: Quintin Buckle, DO;  Location: [facility] ENDOSCOPY;  Service: Gastroenterology;;   COLONOSCOPY WITH PROPOFOL  N/A 10/02/2017   Procedure: COLONOSCOPY WITH PROPOFOL ;  Surgeon: Luke Salaam, MD;  Location[facility] ENDOSCOPY;  Service: Gastroenterology;  Laterality: N/A;   COLONOSCOPY WITH PROPOFOL  N/A 08/21/2022   Procedure: COLONOSCOPY WITH PROPOFOL ;  Surgeon: Quintin Buckle, DO;  Location: [facility] ENDOSCOPY;  Service: Gastroenterology;  Laterality: N/A;   ESOPHAGOGASTRODUODENOSCOPY (EGD) WITH PROPOFOL  N/A 08/11/2022   Procedure: ESOPHAGOGASTRODUODENOSCOPY (EGD) WITH PROPOFOL ;  Surgeon: Marnee Sink, MD;  Location: [facility]  ENDOSCOPY;  Service: Endoscopy;  Laterality: N/A;   HERNIA REPAIR     IR BONE MARROW BIOPSY & ASPIRATION  04/10/2023   PERICARDIOCENTESIS N/A 08/29/2022   Procedure: PERICARDIOCENTESIS;  Surgeon: Sammy Crisp, MD;  Location: [facility] INVASIVE CV LAB;  Service: Cardiovascular;  Laterality: N/A;   POLYPECTOMY  08/21/2022   Procedure: POLYPECTOMY;  Surgeon: Quintin Buckle, DO;  Location: [facility] ENDOSCOPY;  Service: Gastroenterology;;     Social History:   reports that he has never smoked. He has never used smokeless tobacco. He reports that he does not currently use alcohol. He reports that he does not use drugs.   Family History:  His family history includes Heart attack in his father; Heart disease in his father and mother; Stroke in his mother.   Allergies No Known Allergies   Home Medications  Prior to Admission medications   Medication Sig Start Date End Date Taking? Authorizing Provider  acyclovir  (ZOVIRAX ) 400 MG tablet Take 1 tablet (400 mg total) by mouth 2 (two) times daily. 05/09/23   Avonne Boettcher, MD  allopurinol  (ZYLOPRIM ) 300 MG tablet Take 1 tablet (300 mg total) by mouth daily. 05/01/23   Nelda Balsam, NP  BD PEN NEEDLE NANO 2ND GEN 32G X 4 MM MISC USE 1 PEN NEEDLE EVERY DAY AS DIRECTED 01/21/21   Theron Flavin, MD  DULoxetine  (CYMBALTA ) 60 MG capsule Take 1 capsule (60 mg total) by mouth 2 (two) times daily. 04/12/23   Alexander, Natalie, DO  ferrous sulfate  325 (65 FE) MG tablet Take 1 tablet (325 mg total) by mouth 2 (two) times daily with a meal for 14 days. Patient not taking: Reported on 05/16/2023 04/12/23 05/01/23  Alexander, Natalie, DO  hydrOXYzine  (ATARAX ) 25 MG tablet Take 1 tablet (25 mg total) by mouth 3 (three) times daily as needed for itching. 04/12/23   Alexander, Natalie, DO  latanoprost  (XALATAN ) 0.005 % ophthalmic  solution Place 1 drop into both eyes at bedtime. 04/12/23   Alexander, Natalie, DO  melatonin 5 MG TABS Take 1 tablet (5 mg total) by mouth at  bedtime. 04/12/23   Alexander, Natalie, DO  metoprolol  succinate (TOPROL -XL) 50 MG 24 hr tablet Take 1 tablet (50 mg total) by mouth daily. Take with or immediately following a meal. 04/12/23   Alexander, Natalie, DO  mirtazapine  (REMERON ) 15 MG tablet Take 1 tablet (15 mg total) by mouth daily. 04/12/23   Alexander, Natalie, DO  omeprazole  (PRILOSEC) 20 MG capsule Take 20 mg by mouth 2 (two) times daily.    [provider]  ondansetron  (ZOFRAN ) 4 MG tablet Take 1 tablet (4 mg total) by mouth every 6 (six) hours as needed for nausea. 04/12/23   Alexander, Natalie, DO  ondansetron  (ZOFRAN ) 8 MG tablet Take 1 tablet (8 mg total) by mouth every 8 (eight) hours as needed for nausea or vomiting. 05/01/23   Nelda Balsam, NP  oxybutynin  (DITROPAN -XL) 10 MG 24 hr tablet Take 1 tablet (10 mg total) by mouth every evening. 04/12/23   Alexander, Natalie, DO  polyethylene glycol powder (GLYCOLAX /MIRALAX ) 17 GM/SCOOP powder Take 17 g by mouth daily. 04/12/23   Alexander, Natalie, DO  prochlorperazine  (COMPAZINE ) 10 MG tablet Take 1 tablet (10 mg total) by mouth every 6 (six) hours as needed for nausea or vomiting. 05/01/23   Nelda Balsam, NP  senna-docusate (FT STOOL SOFTENER) 8.6-50 MG tablet Take 2 tablets by mouth daily as needed for mild constipation. 04/12/23   Alexander, Natalie, DO  simvastatin  (ZOCOR ) 20 MG tablet Take 1 tablet (20 mg total) by mouth daily. 04/12/23   Alexander, Natalie, DO  sodium phosphate (FLEET) ENEM Place 133 mLs (1 enema total) rectally daily as needed for severe constipation. 04/12/23   Alexander, Natalie, DO     Critical care time: 40 minutes     Cherylann Corpus, AGACNP-BC [facility] Prefer epic messenger for cross cover needs If after hours, please call E-link

## 2023-06-08 NOTE — Plan of Care (Signed)
 Continuing with plan of care.

## 2023-06-08 NOTE — Progress Notes (Signed)
 Report given to receiving nurse Ruthann Cover, RN

## 2023-06-08 NOTE — Plan of Care (Signed)
  Problem: Clinical Measurements: Goal: Diagnostic test results will improve Outcome: Progressing Goal: Respiratory complications will improve Outcome: Progressing Goal: Cardiovascular complication will be avoided Outcome: Progressing   Problem: Pain Managment: Goal: General experience of comfort will improve and/or be controlled Outcome: Progressing   Problem: Safety: Goal: Ability to remain free from injury will improve Outcome: Progressing

## 2023-06-08 NOTE — Progress Notes (Signed)
 Order received to discontinue cardiac monitoring.

## 2023-06-09 DIAGNOSIS — E876 Hypokalemia: Secondary | ICD-10-CM | POA: Diagnosis not present

## 2023-06-09 DIAGNOSIS — C9200 Acute myeloblastic leukemia, not having achieved remission: Secondary | ICD-10-CM | POA: Diagnosis not present

## 2023-06-09 DIAGNOSIS — N179 Acute kidney failure, unspecified: Secondary | ICD-10-CM

## 2023-06-09 DIAGNOSIS — N390 Urinary tract infection, site not specified: Secondary | ICD-10-CM

## 2023-06-09 DIAGNOSIS — A4150 Gram-negative sepsis, unspecified: Secondary | ICD-10-CM

## 2023-06-09 DIAGNOSIS — N1832 Chronic kidney disease, stage 3b: Secondary | ICD-10-CM

## 2023-06-09 DIAGNOSIS — R579 Shock, unspecified: Secondary | ICD-10-CM | POA: Diagnosis not present

## 2023-06-09 LAB — TYPE AND SCREEN
ABO/RH(D): AB POS
Antibody Screen: NEGATIVE
Unit division: 0
Unit division: 0
Unit division: 0
Unit division: 0

## 2023-06-09 LAB — BPAM RBC
Blood Product Expiration Date: 202504302359
Blood Product Expiration Date: 202505022359
Blood Product Expiration Date: 202505092359
Blood Product Expiration Date: 202505112359
ISSUE DATE / TIME: 202504170914
ISSUE DATE / TIME: 202504171631
ISSUE DATE / TIME: 202504172121
ISSUE DATE / TIME: 202504180410
Unit Type and Rh: 5100
Unit Type and Rh: 5100
Unit Type and Rh: 6200
Unit Type and Rh: 9500

## 2023-06-09 LAB — BPAM PLATELET PHERESIS
Blood Product Expiration Date: 202504212359
ISSUE DATE / TIME: 202504180125
Unit Type and Rh: 5100

## 2023-06-09 LAB — GLUCOSE, CAPILLARY
Glucose-Capillary: 78 mg/dL (ref 70–99)
Glucose-Capillary: 79 mg/dL (ref 70–99)
Glucose-Capillary: 85 mg/dL (ref 70–99)
Glucose-Capillary: 87 mg/dL (ref 70–99)
Glucose-Capillary: 121 mg/dL — ABNORMAL HIGH (ref 70–99)
Glucose-Capillary: 117 mg/dL — ABNORMAL HIGH (ref 70–99)

## 2023-06-09 LAB — BASIC METABOLIC PANEL WITH GFR
Anion gap: 10 (ref 5–15)
BUN: 59 mg/dL — ABNORMAL HIGH (ref 8–23)
CO2: 21 mmol/L — ABNORMAL LOW (ref 22–32)
Calcium: 7.6 mg/dL — ABNORMAL LOW (ref 8.9–10.3)
Chloride: 109 mmol/L (ref 98–111)
Creatinine, Ser: 3.36 mg/dL — ABNORMAL HIGH (ref 0.61–1.24)
GFR, Estimated: 18 mL/min — ABNORMAL LOW (ref >60)
Glucose, Bld: 86 mg/dL (ref 70–99)
Potassium: 3.2 mmol/L — ABNORMAL LOW (ref 3.5–5.1)
Sodium: 140 mmol/L (ref 135–145)

## 2023-06-09 LAB — PREPARE PLATELET PHERESIS: Unit division: 0

## 2023-06-09 LAB — MAGNESIUM: Magnesium: 1.7 mg/dL (ref 1.7–2.4)

## 2023-06-09 MED ORDER — MELATONIN 5 MG PO TABS
2.5000 mg | ORAL_TABLET | Freq: Every day | ORAL | Status: DC
  Administered 2023-06-09 – 2023-06-13 (×5): 2.5 mg via ORAL
  Filled 2023-06-09 (×6): qty 1

## 2023-06-09 MED ORDER — ACYCLOVIR 200 MG PO CAPS
400.0000 mg | ORAL_CAPSULE | Freq: Two times a day (BID) | ORAL | Status: DC
  Administered 2023-06-09 – 2023-06-14 (×11): 400 mg via ORAL
  Filled 2023-06-09 (×11): qty 2

## 2023-06-09 MED ORDER — SIMVASTATIN 20 MG PO TABS
20.0000 mg | ORAL_TABLET | Freq: Every day | ORAL | Status: DC
  Administered 2023-06-09 – 2023-06-14 (×6): 20 mg via ORAL
  Filled 2023-06-09 (×6): qty 1

## 2023-06-09 MED ORDER — PANTOPRAZOLE SODIUM 40 MG PO TBEC
40.0000 mg | DELAYED_RELEASE_TABLET | Freq: Every day | ORAL | Status: DC
  Administered 2023-06-09 – 2023-06-14 (×6): 40 mg via ORAL
  Filled 2023-06-09 (×6): qty 1

## 2023-06-09 MED ORDER — OXYBUTYNIN CHLORIDE ER 10 MG PO TB24
10.0000 mg | ORAL_TABLET | Freq: Every evening | ORAL | Status: DC
  Administered 2023-06-09 – 2023-06-13 (×5): 10 mg via ORAL
  Filled 2023-06-09 (×6): qty 1

## 2023-06-09 MED ORDER — SENNOSIDES-DOCUSATE SODIUM 8.6-50 MG PO TABS
2.0000 | ORAL_TABLET | Freq: Every day | ORAL | Status: DC
  Administered 2023-06-10 – 2023-06-13 (×3): 2 via ORAL
  Filled 2023-06-09 (×4): qty 2

## 2023-06-09 MED ORDER — POTASSIUM CHLORIDE 20 MEQ PO PACK
40.0000 meq | PACK | Freq: Once | ORAL | Status: AC
  Administered 2023-06-09: 40 meq via ORAL
  Filled 2023-06-09: qty 2

## 2023-06-09 MED ORDER — FERROUS SULFATE 325 (65 FE) MG PO TABS
325.0000 mg | ORAL_TABLET | Freq: Two times a day (BID) | ORAL | Status: DC
  Administered 2023-06-09 – 2023-06-14 (×11): 325 mg via ORAL
  Filled 2023-06-09 (×11): qty 1

## 2023-06-09 NOTE — Progress Notes (Signed)
 Progress Note   Patient: Robert [name]:[id number] DOB: 25-May-1945 DOA: 06/07/2023     2 DOS: the patient was seen and examined on 06/09/2023   Brief hospital course: 78 y.o. male with PMHx significant for acute myeloblastic leukemia and iron  deficiency anemia, he is currently receiving antineoplastic chemotherapy (most recent treatment 04/16)  He was scheduled to receive 1 unit of pRBC's on 04/17 for hgb of 6.2.  However, he presented to the ER prior to receiving pRBC's due to altered mental status.  Admitted with Acute Metabolic Encephalopathy, Hypovolemic/Septic shock, and Severe Sepsis due to UTI.  Has received 4 units of pRBCs and 1 of platelets this admission.  Weaned off Levophed  last night, hemodynamically stable transferred to [facility] service.   Assessment and Plan: Acute toxic metabolic encephalopathy ~ improved Continue to treat Sepsis & metabolic derangements.  Avoid sedatives and hypnotics. Delirium precautions.   Shock: hypovolemic and septic shock ~ Resolved Continuous cardiac monitoring BP improved now, stop IV fluids Lactic acid has normalized Troponin negative  Will plan to start beta blocker.   Acute kidney injury superimposed on CKD stage IIIa  Anion gap metabolic acidosis ~ improving Lactic acidosis  ~ resolved Hypomagnesia  Hypokalemia Hyperphosphatemia  Monitor I&O's / urinary output Follow BMP Ensure adequate renal perfusion Avoid nephrotoxic agents. Replace electrolytes as indicated ~ Pharmacy following for assistance with electrolyte replacement Nephrology on board, appreciate input   Sepsis in setting of UTI PMhx of ESBL UTI  Continue empiric Meropenem  pending cultures & sensitivities   Acute myeloid leukemia currently receiving antineoplastic chemotherapy  Symptomatic anemia  Thrombocytopenia  Trend CBC, Hb 7.9, platelet 20. Transfuse for hgb <7 and/or platelet count of less than 10K, or active signs of bleeding with platelet count less than  50K Monitor for any bleeding  Oncologist evaluation appreciated, advised palliative care.  Type II diabetes mellitus  Follow hypo/hyperglycemic protocol. Continue accucheks sliding scale.    Out of bed to chair. Incentive spirometry. Nursing supportive care. Fall, aspiration precautions. Diet:  Diet Orders (From admission, onward)     Start     Ordered   06/08/23 0920  Diet heart healthy/carb modified Room service appropriate? Yes; Fluid consistency: Thin  Diet effective now       Question Answer Comment  Diet-HS Snack? Nothing   Room service appropriate? Yes   Fluid consistency: Thin      06/08/23 0919           DVT prophylaxis: SCDs Start: 06/07/23 1059  Level of care: Med-Surg   Code Status: Full Code  Subjective: Patient is seen and examined today morning. I discussed poor prognosis, he agreed with palliative consult.   Physical Exam: Vitals:   06/09/23 0034 06/09/23 0430 06/09/23 0450 06/09/23 0821  BP: (!) 142/83  138/84 (!) 140/94  Pulse: 89  88 91  Resp: 18  17 18   Temp: 98.1 F (36.7 C)  98.8 F (37.1 C) 97.6 F (36.4 C)  TempSrc: Oral   Oral  SpO2: 97%  97% 97%  Weight:  97.7 kg    Height:        General - Elderly Caucasian male, no apparent distress HEENT - PERRLA, EOMI, atraumatic head, non tender sinuses. Lung - Clear, basal rales, no rhonchi, wheezes. Heart - S1, S2 heard, no murmurs, rubs, trace pedal edema. Abdomen - Soft, non tender, bowel sounds good Neuro - Alert, awake and oriented x 3, non focal exam. Skin - Warm and dry.  Data Reviewed:  Latest Ref Rng & Units 06/09/2023    3:20 AM 06/08/2023    7:14 AM 06/08/2023   12:31 AM  CBC  WBC 4.0 - 10.5 K/uL 7.9  8.2  8.1   Hemoglobin 13.0 - 17.0 g/dL 7.9  8.1  6.8   Hematocrit 39.0 - 52.0 % 23.6  23.8  20.2   Platelets 150 - 400 K/uL 20  29  16        Latest Ref Rng & Units 06/09/2023    3:20 AM 06/08/2023    7:14 AM 06/07/2023    5:50 PM  BMP  Glucose 70 - 99 mg/dL 86  161   096   BUN 8 - 23 mg/dL 59  57  53   Creatinine 0.61 - 1.24 mg/dL 0.45  4.09  8.11   Sodium 135 - 145 mmol/L 140  139  140   Potassium 3.5 - 5.1 mmol/L 3.2  3.7  4.0   Chloride 98 - 111 mmol/L 109  111  112   CO2 22 - 32 mmol/L 21  18  16    Calcium 8.9 - 10.3 mg/dL 7.6  7.2  7.3    No results found.  Family Communication: Discussed with patient, understand and agree. He agreed with palliative care.  Disposition: Status is: Inpatient Remains inpatient appropriate because: sepsis, palliative discussion.  Planned Discharge Destination: Skilled nursing facility     MDM level 3- He is sick with multiorgan dysfunction. He has AML with no response to chemo, poor prognosis. Has anemia, low platelets. High risk for sudden clinical deterioration.  Author: Aisha Hove, MD 06/09/2023 3:37 PM Secure chat 7am to 7pm For on call review [email or web address].

## 2023-06-09 NOTE — Plan of Care (Signed)

## 2023-06-09 NOTE — Progress Notes (Signed)
 [facility]  ROUNDING NOTE   Subjective:   [name] was admitted to [facility] on 06/07/2023 for Dehydration [E86.0] Shock circulatory (HCC) [R57.9] Severe anemia [D64.9] Altered mental status, unspecified altered mental status type [R41.82] Acute myeloid leukemia not having achieved remission [facility]) [C92.00]  Patient was admitted to [facility] from 3/26 to 4/4 for acute kidney injury thought to be secondary to chemotherapy or antibiotics. He was discharged with a creatinine of 3.47 and GFR of 17.   Patient alert and awake laying in bed Room air No lower extremity edema  Creatinine 3.36 Urine output   Objective:  Vital signs in last 24 hours:  Temp:  [97.6 F (36.4 C)-98.8 F (37.1 C)] 97.6 F (36.4 C) (04/19 0821) Pulse Rate:  [88-112] 91 (04/19 0821) Resp:  [17-18] 18 (04/19 0821) BP: (109-142)/(66-94) 140/94 (04/19 0821) SpO2:  [97 %-100 %] 97 % (04/19 0821) Weight:  [97.7 kg] 97.7 kg (04/19 0430)  Weight change: 6.073 kg Filed Weights   06/07/23 0741 06/08/23 0422 06/09/23 0430  Weight: 91.6 kg 97.7 kg 97.7 kg    Intake/Output: I/O last 3 completed shifts: In: 2265.4 [P.O.:240; I.V.:585.3; Blood:990; IV Piggyback:450.1] Out: 1200 [Urine:1200]   Intake/Output this shift:  No intake/output data recorded.  Physical Exam: General: NAD, laying in bed  Head: Normocephalic, atraumatic. Moist oral mucosal membranes  Eyes: Anicteric  Lungs:  Clear to auscultation, normal effort  Heart: Regular rate and rhythm  Abdomen:  Soft, tenderness,   Extremities: No peripheral edema.  Neurologic: Alert and oriented, moving all four extremities  Skin: No lesions       Basic Metabolic Panel: Recent Labs  Lab 06/06/23 1050 06/07/23 0748 06/07/23 1355 06/07/23 1750 06/08/23 0714 06/09/23 0320  NA 135 140 140 140 139 140  K 3.9 3.8 3.6 4.0 3.7 3.2*  CL 108 112* 112* 112* 111 109  CO2 17* 17* 18* 16* 18* 21*  GLUCOSE 105* 104* 127* 143* 139* 86   BUN 56* 58* 53* 53* 57* 59*  CREATININE 3.53* 3.84* 3.77* 3.68* 3.66* 3.36*  CALCIUM 8.2* 8.0* 7.3* 7.3* 7.2* 7.6*  MG  --  1.3*  --   --  1.7 1.7  PHOS 5.8* 4.7*  --   --  6.0*  --     Liver Function Tests: Recent Labs  Lab 06/06/23 1050 06/07/23 0748  AST 16 27  ALT 9 12  ALKPHOS 90 112  BILITOT 0.7 0.8  PROT 7.6 6.7  ALBUMIN  2.1* 1.7*   No results for input(s): "LIPASE", "AMYLASE" in the last 168 hours. No results for input(s): "AMMONIA" in the last 168 hours.  CBC: Recent Labs  Lab 06/06/23 1050 06/07/23 0748 06/07/23 1355 06/08/23 0031 06/08/23 0714 06/09/23 0320  WBC 4.5 8.1 15.4* 8.1 8.2 7.9  NEUTROABS 1.4* 5.7 12.0*  --   --   --   HGB 6.2* 5.7* 6.3* 6.8* 8.1* 7.9*  HCT 20.8* 18.8* 19.7* 20.2* 23.8* 23.6*  MCV 93.3 93.5 89.1 86.3 85.6 85.5  PLT 24* 21* 20* 16* 29* 20*    Cardiac Enzymes: No results for input(s): "CKTOTAL", "CKMB", "CKMBINDEX", "TROPONINI" in the last 168 hours.   BNP: Invalid input(s): "POCBNP"  CBG: Recent Labs  Lab 06/08/23 2135 06/09/23 0116 06/09/23 0426 06/09/23 0823 06/09/23 1159  GLUCAP 90 78 79 85 87    Microbiology: Results for orders placed or performed during the hospital encounter of 06/07/23  Blood culture (routine x 2)     Status: None (  Preliminary result)   Collection Time: 06/07/23  7:48 AM   Specimen: BLOOD  Result Value Ref Range Status   Specimen Description BLOOD BLOOD RIGHT ARM  Final   Special Requests   Final    BOTTLES DRAWN AEROBIC AND ANAEROBIC Blood Culture results may not be optimal due to an inadequate volume of blood received in culture bottles   Culture   Final    NO GROWTH 2 DAYS Performed at [facility], [address] [place], [place] [address]    Report Status PENDING  Incomplete  Blood culture (routine x 2)     Status: None (Preliminary result)   Collection Time: 06/07/23  8:05 AM   Specimen: BLOOD  Result Value Ref Range Status   Specimen Description BLOOD BLOOD LEFT  ARM  Final   Special Requests   Final    BOTTLES DRAWN AEROBIC AND ANAEROBIC Blood Culture results may not be optimal due to an inadequate volume of blood received in culture bottles   Culture   Final    NO GROWTH 2 DAYS Performed at [facility], [address], [place], [place] [address]    Report Status PENDING  Incomplete  Urine Culture     Status: Abnormal (Preliminary result)   Collection Time: 06/07/23 10:06 AM   Specimen: Urine, Random  Result Value Ref Range Status   Specimen Description   Final    URINE, RANDOM Performed at [facility], [address], [place], [place] [address]    Special Requests   Final    NONE Reflexed from [id number] Performed at [facility], [address] [place], [place] [address]    Culture (A)  Final    >=100,000 COLONIES/mL KLEBSIELLA PNEUMONIAE CONFIRMATION OF SUSCEPTIBILITIES IN PROGRESS Performed at [facility], [address] [place], [place] [address]    Report Status PENDING  Incomplete  Resp panel by RT-PCR (RSV, Flu A&B, Covid) Anterior Nasal Swab     Status: None   Collection Time: 06/07/23 11:56 AM   Specimen: Anterior Nasal Swab  Result Value Ref Range Status   SARS Coronavirus 2 by RT PCR NEGATIVE NEGATIVE Final    Comment: (NOTE) SARS-CoV-2 target nucleic acids are NOT DETECTED.  The SARS-CoV-2 RNA is generally detectable in upper respiratory specimens during the acute phase of infection. The lowest concentration of SARS-CoV-2 viral copies this assay can detect is 138 copies/mL. A negative result does not preclude SARS-Cov-2 infection and should not be used as the sole basis for treatment or other patient management decisions. A negative result may occur with  improper specimen collection/handling, submission of specimen other than nasopharyngeal swab, presence of viral mutation(s) within the areas targeted by this assay, and inadequate number of viral copies(<138 copies/mL). A negative  result must be combined with clinical observations, patient history, and epidemiological information. The expected result is Negative.  Fact Sheet for Patients:  BloggerCourse.com  Fact Sheet for Healthcare Providers:  SeriousBroker.it  This test is no t yet approved or cleared by the [place]  FDA and  has been authorized for detection and/or diagnosis of SARS-CoV-2 by FDA under an Emergency Use Authorization (EUA). This EUA will remain  in effect (meaning this test can be used) for the duration of the COVID-19 declaration under Section 564(b)(1) of the Act, 21 U.S.C.section 360bbb-3(b)(1), unless the authorization is terminated  or revoked sooner.       Influenza A by PCR NEGATIVE NEGATIVE Final   Influenza B by  PCR NEGATIVE NEGATIVE Final    Comment: (NOTE) The Xpert Xpress SARS-CoV-2/FLU/RSV plus assay is intended as an aid in the diagnosis of influenza from Nasopharyngeal swab specimens and should not be used as a sole basis for treatment. Nasal washings and aspirates are unacceptable for Xpert Xpress SARS-CoV-2/FLU/RSV testing.  Fact Sheet for Patients: BloggerCourse.com  Fact Sheet for Healthcare Providers: SeriousBroker.it  This test is not yet approved or cleared by the [place]  FDA and has been authorized for detection and/or diagnosis of SARS-CoV-2 by FDA under an Emergency Use Authorization (EUA). This EUA will remain in effect (meaning this test can be used) for the duration of the COVID-19 declaration under Section 564(b)(1) of the Act, 21 U.S.C. section 360bbb-3(b)(1), unless the authorization is terminated or revoked.     Resp Syncytial Virus by PCR NEGATIVE NEGATIVE Final    Comment: (NOTE) Fact Sheet for Patients: BloggerCourse.com  Fact Sheet for Healthcare Providers: SeriousBroker.it  This  test is not yet approved or cleared by the [place]  FDA and has been authorized for detection and/or diagnosis of SARS-CoV-2 by FDA under an Emergency Use Authorization (EUA). This EUA will remain in effect (meaning this test can be used) for the duration of the COVID-19 declaration under Section 564(b)(1) of the Act, 21 U.S.C. section 360bbb-3(b)(1), unless the authorization is terminated or revoked.  Performed at [facility], [address] [place], [place] [address]     Coagulation Studies: Recent Labs    06/07/23 1355  LABPROT 17.4*  INR 1.4*    Urinalysis: Recent Labs    06/07/23 1006  COLORURINE YELLOW*  LABSPEC 1.008  PHURINE 6.0  GLUCOSEU NEGATIVE  HGBUR MODERATE*  BILIRUBINUR NEGATIVE  KETONESUR NEGATIVE  PROTEINUR 30*  NITRITE NEGATIVE  LEUKOCYTESUR LARGE*      Imaging: No results found.    Medications:    meropenem  (MERREM ) IV 1 g (06/09/23 1143)    acyclovir   400 mg Oral BID   DULoxetine   60 mg Oral BID   ferrous sulfate   325 mg Oral BID WC   insulin  aspart  0-6 Units Subcutaneous Q4H   latanoprost   1 drop Both Eyes QHS   mirtazapine   15 mg Oral QHS   oxybutynin   10 mg Oral QPM   pantoprazole   40 mg Oral Daily   senna-docusate  2 tablet Oral QHS   simvastatin   20 mg Oral Daily   acetaminophen , docusate sodium , ondansetron  (ZOFRAN ) IV, mouth rinse, polyethylene glycol  Assessment/ Plan:  [name] is a 78 y.o.  male with medical problems of acute myeloid leukoma, history of sigmoid colon cancer status post partial colectomy in November 2024, anemia, diabetes mellitus type II, peripheral arterial disease, chronic systolic congestive heart failure, history of pericardial effusion status post pericardial window. was admitted on 06/07/2023  for Dehydration [E86.0] Shock circulatory (HCC) [R57.9] Severe anemia [D64.9] Altered mental status, unspecified altered mental status type [R41.82] Acute myeloid leukemia not  having achieved remission (HCC) [C92.00]   Acute Kidney Injury on chronic kidney disease stage 4 with history of proteinuria. with baseline creatinine 2.25 and GFR of 29 on 05/09/2023. CT in February 2025 showed severely atrophic left kidney. - This may represent new baseline for this patient.  - Has began making more urine, will continue to monitor this trend.     Lab Results  Component Value Date   CREATININE 3.36 (H) 06/09/2023   CREATININE 3.66 (H) 06/08/2023   CREATININE 3.68 (H) 06/07/2023    Intake/Output Summary (  Last 24 hours) at 06/09/2023 1326 Last data filed at 06/09/2023 0546 Gross per 24 hour  Intake 440 ml  Output 350 ml  Net 90 ml   2. Anemia of chronic kidney disease with thrombocytopenia Lab Results  Component Value Date   HGB 7.9 (L) 06/09/2023    Patient with history of AML, pancytopenia and multiple blood transfusions in the past.  Appreciate hematology input.  Hgb below optimal range. Will continue to monitor for now.   3.  Chronic systolic heart failure : not in acute exacerbation. - holding furosemide  due to acute kidney injury.     LOS: 2 Airyana Sprunger 4/19/20251:26 PM

## 2023-06-09 NOTE — Evaluation (Signed)
 Occupational Therapy Evaluation Patient Details Name: Robert [name] MRN: [id number] DOB: 1945-11-11 Today's Date: 06/09/2023   History of Present Illness   Pt is a 78 y.o. male presenting to hospital 06/07/23 with decreased responsiveness; low BP noted by EMS.  Currently receiving antineoplastic chemotherapy (most recent treatment 4/16).  Pt admitted with acute toxic metabolic encephalopathy.  S/p 1 unit PRBC's d/t low Hgb.  PMH includes acute myeloblastic leukemia, iron  deficiency anemia, DM, htn, sigmoid colon CA s/p partial colectomy Nov 2024, chronic HFrEF, CKD, htn, PVD.     Clinical Impressions Robert [name] was seen for OT evaluation this date. Prior to hospital admission, pt was at LTC. On arrival pt seated Eob with PT in soiled bed. Attempted to stand with MAX x2, unable to clear rear. Returned to bed with MIN A for linen change. SUPERVISION rolling bed level for MAX A pericare. Pt would benefit from skilled OT to address noted impairments and functional limitations (see below for any additional details). Upon hospital discharge, recommend return to facility with follow up therapy.     If plan is discharge home, recommend the following:   Two people to help with walking and/or transfers;Two people to help with bathing/dressing/bathroom     Functional Status Assessment   Patient has had a recent decline in their functional status and demonstrates the ability to make significant improvements in function in a reasonable and predictable amount of time.     Equipment Recommendations   Hospital bed     Recommendations for Other Services         Precautions/Restrictions   Precautions Precautions: Fall Recall of Precautions/Restrictions: Intact Restrictions Weight Bearing Restrictions Per Provider Order: No     Mobility Bed Mobility Overal bed mobility: Needs Assistance Bed Mobility: Sit to Supine, Rolling Rolling: Supervision, Used rails   Supine to sit:   (assist for trunk) Sit to supine: Min assist        Transfers Overall transfer level: Needs assistance Equipment used: 2 person hand held assist Transfers: Sit to/from Stand Sit to Stand: Max assist, +2 physical assistance, From elevated surface           General transfer comment: pt unable to clear bottom from bed attempting to stand with 2 assist      Balance Overall balance assessment: Needs assistance, History of Falls Sitting-balance support: No upper extremity supported, Feet supported Sitting balance-[name]: Good                                     ADL either performed or assessed with clinical judgement   ADL Overall ADL's : Needs assistance/impaired                                       General ADL Comments: MIN A don/doff gown in sitting.         Extremity/Trunk Assessment Upper Extremity Assessment Upper Extremity Assessment: Generalized weakness   Lower Extremity Assessment Lower Extremity Assessment: Generalized weakness   Cervical / Trunk Assessment Cervical / Trunk Assessment: Other exceptions Cervical / Trunk Exceptions: forward head/shoulders   Communication Communication Communication: No apparent difficulties   Cognition Arousal: Alert Behavior During Therapy: WFL for tasks assessed/performed Cognition: No apparent impairments  Following commands: Intact       Cueing  General Comments   Cueing Techniques: Verbal cues      Exercises     Shoulder Instructions      Home Living Family/patient expects to be discharged to:: Skilled nursing facility                                 Additional Comments: Pt reports he has been at [facility] LTC facility for last 3 months.      Prior Functioning/Environment Prior Level of Function : History of Falls (last six months)             Mobility Comments: Pt reports at facility he  either uses slideboard or staff picks him up to transfer him to/from w/c; pt reports he propels manual w/c on own (with UE's and LE's). ADLs Comments: Pt reports he either uses urinal or bed pan for toileting; assist for donning/doffing socks; staff assists with starting to put on pants and shirt but pt finishes putting them on    OT Problem List: Decreased strength;Decreased activity tolerance;Decreased safety awareness;Impaired balance (sitting and/or standing);Decreased knowledge of use of DME or AE   OT Treatment/Interventions: Self-care/ADL training;Therapeutic exercise;Therapeutic activities;Energy conservation;DME and/or AE instruction;Patient/family education;Balance training      OT Goals(Current goals can be found in the care plan section)   Acute Rehab OT Goals Patient Stated Goal: to go home OT Goal Formulation: With patient Time For Goal Achievement: 06/23/23 Potential to Achieve Goals: Fair ADL Goals Pt Will Perform Grooming: with modified independence;sitting Pt Will Perform Lower Body Dressing: with min assist;sitting/lateral leans Pt Will Transfer to Toilet: with min assist;with transfer board;bedside commode   OT Frequency:  Min 1X/week    Co-evaluation PT/OT/SLP Co-Evaluation/Treatment: Yes Reason for Co-Treatment: For patient/therapist safety;To address functional/ADL transfers PT goals addressed during session: Mobility/safety with mobility OT goals addressed during session: ADL's and self-care      AM-PAC OT "6 Clicks" Daily Activity     Outcome Measure Help from another person eating meals?: None Help from another person taking care of personal grooming?: A Little Help from another person toileting, which includes using toliet, bedpan, or urinal?: A Lot Help from another person bathing (including washing, rinsing, drying)?: A Lot Help from another person to put on and taking off regular upper body clothing?: A Little Help from another person to put on and  taking off regular lower body clothing?: A Lot 6 Click Score: 16   End of Session Nurse Communication: Mobility status  Activity Tolerance: Patient tolerated treatment well Patient left: in bed;with call bell/phone within reach;with bed alarm set  OT Visit Diagnosis: Unsteadiness on feet (R26.81);Muscle weakness (generalized) (M62.81)                Time: 1610-9604 OT Time Calculation (min): 15 min Charges:  OT General Charges $OT Visit: 1 Visit OT Evaluation $OT Eval Low Complexity: 1 Low  Gordan Latina, M.S. OTR/L  06/09/23, 11:04 AM  ascom [phone]

## 2023-06-09 NOTE — Evaluation (Signed)
 Physical Therapy Evaluation Patient Details Name: Robert [name] MRN: [id number] DOB: [birth year] Today's Date: 06/09/2023  History of Present Illness  Pt is a 78 y.o. male presenting to hospital 06/07/23 with decreased responsiveness; low BP noted by EMS.  Currently receiving antineoplastic chemotherapy (most recent treatment 4/16).  Pt admitted with acute toxic metabolic encephalopathy.  S/p 1 unit PRBC's d/t low Hgb.  PMH includes acute myeloblastic leukemia, iron  deficiency anemia, DM, htn, sigmoid colon CA s/p partial colectomy Nov 2024, chronic HFrEF, CKD, htn, PVD.  Clinical Impression  Prior to recent medical concerns, pt reports using slide-board or staff picks him up to transfer to/from w/c; pt reports being able to propel self in w/c on own; lives at LTC facility.  Currently pt is min to mod assist with bed mobility; SBA sitting balance; and unable to stand with 2 assist from elevated bed height (partial PT/OT co-eval performed).  Pt would currently benefit from skilled PT to address noted impairments and functional limitations (see below for any additional details).  Upon hospital discharge, pt would benefit from ongoing therapy.     If plan is discharge home, recommend the following: Two people to help with walking and/or transfers;A lot of help with bathing/dressing/bathroom;Assistance with cooking/housework;Assist for transportation;Help with stairs or ramp for entrance   Can travel by private vehicle   No    Equipment Recommendations Hoyer lift  Recommendations for Other Services       Functional Status Assessment Patient has had a recent decline in their functional status and demonstrates the ability to make significant improvements in function in a reasonable and predictable amount of time.     Precautions / Restrictions Precautions Precautions: Fall Recall of Precautions/Restrictions: Intact Restrictions Weight Bearing Restrictions Per Provider Order: No       Mobility  Bed Mobility Overal bed mobility: Needs Assistance Bed Mobility: Supine to Sit, Sit to Supine, Rolling Rolling: Supervision, Used rails   Supine to sit: Mod assist, Used rails (assist for trunk) Sit to supine: Min assist, Used rails (assist for B LE's)   General bed mobility comments: vc's for technique    Transfers Overall transfer level: Needs assistance Equipment used: 2 person hand held assist Transfers: Sit to/from Stand Sit to Stand: Max assist, +2 physical assistance           General transfer comment: pt unable to clear bottom from bed attempting to stand with 2 assist (bed height elevated; vc's for technique)    Ambulation/Gait               General Gait Details: Pt not recently ambulatory per pt report  Stairs            Wheelchair Mobility     Tilt Bed    Modified Rankin (Stroke Patients Only)       Balance Overall balance assessment: Needs assistance, History of Falls Sitting-balance support: No upper extremity supported, Feet supported Sitting balance-[name]: Good Sitting balance - Comments: steady reaching within BOS                                     Pertinent Vitals/Pain Pain Assessment Pain Assessment: No/denies pain    Home Living Family/patient expects to be discharged to:: Skilled nursing facility                   Additional Comments: Pt reports he has been at [facility]  LTC facility for last 3 months.    Prior Function Prior Level of Function : History of Falls (last six months)             Mobility Comments: Pt reports at facility he either uses slideboard or staff picks him up to transfer him to/from w/c; pt reports he propels manual w/c on own (with UE's and LE's). ADLs Comments: Pt reports he either uses urinal or bed pan for toileting; assist for donning/doffing socks; staff assists with starting to put on pants and shirt but pt finishes putting them on      Extremity/Trunk Assessment   Upper Extremity Assessment Upper Extremity Assessment: Generalized weakness    Lower Extremity Assessment Lower Extremity Assessment: Generalized weakness    Cervical / Trunk Assessment Cervical / Trunk Assessment: Other exceptions Cervical / Trunk Exceptions: forward head/shoulders  Communication   Communication Communication: No apparent difficulties    Cognition Arousal: Alert Behavior During Therapy: WFL for tasks assessed/performed   PT - Cognitive impairments: No apparent impairments                       PT - Cognition Comments: Pleasant and participatory; A&Ox4 Following commands: Intact       Cueing Cueing Techniques: Verbal cues     General Comments  Nursing cleared pt for participation in physical therapy.  Pt agreeable to PT session.  Some blood clots and bleeding noted on pt's sheets (pt reports from foley catheter removal yesterday)--nurse notified and came to assess and nurse reported she would update MD.    Exercises     Assessment/Plan    PT Assessment Patient needs continued PT services  PT Problem List Decreased strength;Decreased activity tolerance;Decreased balance;Decreased mobility       PT Treatment Interventions DME instruction;Functional mobility training;Therapeutic activities;Therapeutic exercise;Balance training;Patient/family education    PT Goals (Current goals can be found in the Care Plan section)  Acute Rehab PT Goals Patient Stated Goal: to improve transfers PT Goal Formulation: With patient Time For Goal Achievement: 06/23/23 Potential to Achieve Goals: Fair    Frequency Min 1X/week     Co-evaluation PT/OT/SLP Co-Evaluation/Treatment: Yes Reason for Co-Treatment: For patient/therapist safety;To address functional/ADL transfers PT goals addressed during session: Mobility/safety with mobility OT goals addressed during session: ADL's and self-care       AM-PAC PT "6 Clicks"  Mobility  Outcome Measure Help needed turning from your back to your side while in a flat bed without using bedrails?: A Little Help needed moving from lying on your back to sitting on the side of a flat bed without using bedrails?: A Lot Help needed moving to and from a bed to a chair (including a wheelchair)?: Total Help needed standing up from a chair using your arms (e.g., wheelchair or bedside chair)?: Total Help needed to walk in hospital room?: Total Help needed climbing 3-5 steps with a railing? : Total 6 Click Score: 9    End of Session   Activity Tolerance: Patient tolerated treatment well Patient left: in bed (with OT present for OT evaluation) Nurse Communication: Mobility status;Precautions PT Visit Diagnosis: Other abnormalities of gait and mobility (R26.89);Muscle weakness (generalized) (M62.81);History of falling (Z91.81)    Time: 4098-1191 PT Time Calculation (min) (ACUTE ONLY): 20 min   Charges:   PT Evaluation $PT Eval Low Complexity: 1 Low PT Treatments $Therapeutic Activity: 8-22 mins PT General Charges $$ ACUTE PT VISIT: 1 Visit        Amador Junes,  PT 06/09/23, 10:25 AM

## 2023-06-10 DIAGNOSIS — Z515 Encounter for palliative care: Secondary | ICD-10-CM | POA: Diagnosis not present

## 2023-06-10 DIAGNOSIS — R579 Shock, unspecified: Secondary | ICD-10-CM | POA: Diagnosis not present

## 2023-06-10 DIAGNOSIS — E876 Hypokalemia: Secondary | ICD-10-CM | POA: Diagnosis not present

## 2023-06-10 DIAGNOSIS — C9200 Acute myeloblastic leukemia, not having achieved remission: Secondary | ICD-10-CM | POA: Diagnosis not present

## 2023-06-10 LAB — CBC
HCT: 23.6 % — ABNORMAL LOW (ref 39.0–52.0)
HCT: 24.3 % — ABNORMAL LOW (ref 39.0–52.0)
Hemoglobin: 7.9 g/dL — ABNORMAL LOW (ref 13.0–17.0)
Hemoglobin: 8.1 g/dL — ABNORMAL LOW (ref 13.0–17.0)
MCH: 28.6 pg (ref 26.0–34.0)
MCH: 29.2 pg (ref 26.0–34.0)
MCHC: 33.5 g/dL (ref 30.0–36.0)
MCHC: 33.3 g/dL (ref 30.0–36.0)
MCV: 85.5 fL (ref 80.0–100.0)
MCV: 87.7 fL (ref 80.0–100.0)
Platelets: 20 10*3/uL — CL (ref 150–400)
Platelets: 17 10*3/uL — CL (ref 150–400)
RBC: 2.76 MIL/uL — ABNORMAL LOW (ref 4.22–5.81)
RBC: 2.77 MIL/uL — ABNORMAL LOW (ref 4.22–5.81)
RDW: 19.1 % — ABNORMAL HIGH (ref 11.5–15.5)
RDW: 18.5 % — ABNORMAL HIGH (ref 11.5–15.5)
WBC: 7.9 10*3/uL (ref 4.0–10.5)
WBC: 4.9 10*3/uL (ref 4.0–10.5)
nRBC: 0 % (ref 0.0–0.2)
nRBC: 0.4 % — ABNORMAL HIGH (ref 0.0–0.2)

## 2023-06-10 LAB — GLUCOSE, CAPILLARY
Glucose-Capillary: 111 mg/dL — ABNORMAL HIGH (ref 70–99)
Glucose-Capillary: 125 mg/dL — ABNORMAL HIGH (ref 70–99)
Glucose-Capillary: 70 mg/dL (ref 70–99)
Glucose-Capillary: 71 mg/dL (ref 70–99)
Glucose-Capillary: 81 mg/dL (ref 70–99)
Glucose-Capillary: 82 mg/dL (ref 70–99)
Glucose-Capillary: 83 mg/dL (ref 70–99)

## 2023-06-10 LAB — BASIC METABOLIC PANEL WITH GFR
Anion gap: 9 (ref 5–15)
BUN: 52 mg/dL — ABNORMAL HIGH (ref 8–23)
CO2: 20 mmol/L — ABNORMAL LOW (ref 22–32)
Calcium: 7.8 mg/dL — ABNORMAL LOW (ref 8.9–10.3)
Chloride: 110 mmol/L (ref 98–111)
Creatinine, Ser: 3.16 mg/dL — ABNORMAL HIGH (ref 0.61–1.24)
GFR, Estimated: 19 mL/min — ABNORMAL LOW (ref >60)
Glucose, Bld: 67 mg/dL — ABNORMAL LOW (ref 70–99)
Potassium: 3.8 mmol/L (ref 3.5–5.1)
Sodium: 139 mmol/L (ref 135–145)

## 2023-06-10 LAB — URINE CULTURE: Culture: >=100000 — AB

## 2023-06-10 MED ORDER — METOPROLOL SUCCINATE ER 50 MG PO TB24
50.0000 mg | ORAL_TABLET | Freq: Every day | ORAL | Status: DC
  Administered 2023-06-10 – 2023-06-14 (×5): 50 mg via ORAL
  Filled 2023-06-10 (×5): qty 1

## 2023-06-10 NOTE — Progress Notes (Addendum)
 Daily Progress Note   Patient Name: Robert [name]       Date: 06/10/2023 DOB: Jul 03, 1945  Age: 78 y.o. MRN#: [id number] Attending Physician: Robert Hove, MD Primary Care Physician: Robert Flavin, MD Admit Date: 06/07/2023  Reason for Consultation/Follow-up: Establishing goals of care  HPI/Brief Hospital Review:  78 y.o. male  with past medical history of T2DM, CKD stage 3a, PAD, HFrEF with EF 40%, ESBL UTI, sigmoid colon cancer s/p partial colectomy (12/2022), IDA and AML currently receiving antineoplastic chemotherapy admitted from [facility] on 06/07/2023 with decreased responsiveness and hypotension.Last chemo treatment on 4/16, scheduled for blood transfusion as outpatient due to hemoglobin of 6.2. On arrival to ED, he was found to be significantly hypotensive and a hemoglobin of 6.2.   Admitted to ICU, started on vasopressor support and received 1 unit PRBCs Quickly transitioned off of vasopressor support and hemoglobin improved to 8.1   Palliative medicine was consulted for assisting with goals of care conversations.  Subjective: Extensive chart review has been completed prior to meeting patient including labs, vital signs, imaging, progress notes, orders, and available advanced directive documents from current and previous encounters.    Visited with Robert [name] at his bedside.  He is awake, alert, and able to engage in goals of care conversations.  No family at bedside during time of visit.  Assessed symptoms.  He denies acute pain or discomfort reports sleeping well overnight and no issues with his appetite.  He shares with me he is struggling emotionally due to conversations he has had yesterday and today with his primary team.  He has been told that his body is  no longer able to tolerate cancer treatments and that interventions at this time are likely limited.  He mentions end-of-life/hospice care has been discussed with him.  Reviewed with him notes from his oncologist from 4/18.  We also reviewed his renal function currently and trends.  We discussed patient's current illness and what it means in the larger context of patient's on-going co-morbidities. Natural disease trajectory and expectations at EOL were discussed.   He is appreciative of conversations.  He shares he has a better understanding and acceptance of where he is at in his disease process.  We discussed CODE STATUS and the difference between full code and DO NOT RESUSCITATE. Encouraged patient/family to  consider DNR/DNI status understanding evidenced based poor outcomes in similar hospitalized patients, as the cause of the arrest is likely associated with chronic/terminal disease rather than a reversible acute cardio-pulmonary event.  He shares at this time he feels DNR/DNI status is most appropriate but wishes to have discussions with his wife prior to making any changes to his goals of care.  He also expresses interest in transitioning to hospice care and focusing on comfort during the time that he has left.  Again he is not in a place to make these final decisions prior to speaking with his wife.  He requested I reach out to his wife to set a time for them to have conversations with PMT present.  He explains his wife typically does not wake up until much later in the afternoon on or around 3 PM.  Called wife after 3 PM with no answer.  Did receive call back and was able to speak with Robert [name].  Robert [name] shares she has had no updates from medical providers but is spoken with nursing staff a few times.  She shares she does not have a good understanding of [name]'s current medical condition.  She shares due to reflux she is unable to visit today.  She hopes to visit tomorrow but will only be able to  visit if valet parking is available as she she utilizes a cane for ambulation.  Shares that she would likely be able to visit later in the afternoon.  Emphasized importance of a meeting time so Robert [name] could explain and share his wishes with her related to his current medical condition and goals moving forward.  Answered and addressed all questions and concerns.  Will have PMT provider reach out to Robert [name] tomorrow to set a meeting time.  PMT to continue to follow for ongoing needs and support.  Addendum: Notified by nurse that Robert [name] was able to speak with his wife Robert [name] and discuss code status and they are both in agreement with DNR/DNI. Confirmed this with Robert [name] changed in computer, golden form completed placed in chart and uploaded to [facility]. Plan for PMT provider to meet with Robert [name] and his wife Robert [name] tomorrow to continue GOC discussions.  Thank you for allowing the Palliative Medicine Team to assist in the care of this patient.  Total time:  35 minutes  Time spent includes: Detailed review of medical records (labs, imaging, vital signs), medically appropriate exam (mental status, respiratory, cardiac, skin), discussed with treatment team, counseling and educating patient, family and staff, documenting clinical information, medication management and coordination of care.  Robert Marble, DNP, AGNP-C Palliative Medicine   Please contact Palliative Medicine Team phone at [phone] for questions and concerns.

## 2023-06-10 NOTE — Progress Notes (Signed)
 Progress Note   Patient: Robert [name] NGE:[id number] DOB: Jan 13, 1946 DOA: 06/07/2023     3 DOS: the patient was seen and examined on 06/10/2023   Brief hospital course: 78 y.o. male with PMHx significant for acute myeloblastic leukemia and iron  deficiency anemia, he is currently receiving antineoplastic chemotherapy (most recent treatment 04/16)  He was scheduled to receive 1 unit of pRBC's on 04/17 for hgb of 6.2.  However, he presented to the ER prior to receiving pRBC's due to altered mental status.  Admitted with Acute Metabolic Encephalopathy, Hypovolemic/Septic shock, and Severe Sepsis due to UTI.  Has received 4 units of pRBCs and 1 of platelets this admission.  Weaned off Levophed  last night, hemodynamically stable transferred to TRH service.   Assessment and Plan: Acute toxic metabolic encephalopathy ~ improved Continue to treat Sepsis & metabolic derangements.  Avoid sedatives and hypnotics. Delirium precautions.   Shock: hypovolemic and septic shock ~ Resolved Continuous cardiac monitoring. BP improved now, stop IV fluids Lactic acid has normalized Troponin negative. Resumed beta blocker as BP improved.   Acute kidney injury superimposed on CKD stage IIIa  Anion gap metabolic acidosis ~ improving Lactic acidosis  ~ resolved Hypomagnesia  Hypokalemia Hyperphosphatemia  Monitor I&O's / urinary output Follow BMP Ensure adequate renal perfusion Avoid nephrotoxic agents. Replace electrolytes as indicated ~ Pharmacy following for assistance with electrolyte replacement Nephrology on board, appreciate input   Sepsis in setting of UTI PMhx of ESBL UTI  Continue empiric Meropenem , pending cultures & sensitivities   Acute myeloid leukemia currently receiving antineoplastic chemotherapy not having achieved remission  Symptomatic anemia  Thrombocytopenia  Trend CBC, Hb 8.1, platelet 17. Transfuse for hgb <7 and/or platelet count of less than 10K, or active signs of  bleeding with platelet count less than 50K Monitor for any bleeding  S/p 2 doses of Decitabine . Oncologist evaluation appreciated, advised palliative care. Goals of care discussion.  Type II diabetes mellitus  Follow hypo/hyperglycemic protocol. Continue accucheks sliding scale.    Out of bed to chair. Incentive spirometry. Nursing supportive care. Fall, aspiration precautions. Diet:  Diet Orders (From admission, onward)     Start     Ordered   06/08/23 0920  Diet heart healthy/carb modified Room service appropriate? Yes; Fluid consistency: Thin  Diet effective now       Question Answer Comment  Diet-HS Snack? Nothing   Room service appropriate? Yes   Fluid consistency: Thin      06/08/23 0919           DVT prophylaxis: SCDs Start: 06/07/23 1059  Level of care: Med-Surg   Code Status: Full Code Palliative team consulted.  Subjective: Patient is seen and examined today morning. He denies complaints. Has tachycardia, BP elevated. He agreed with palliative discussion along with his wife.   Physical Exam: Vitals:   06/10/23 0402 06/10/23 0432 06/10/23 0824 06/10/23 1135  BP: (!) 155/103  (!) 146/102 (!) 146/102  Pulse: (!) 106  (!) 113 (!) 113  Resp: 16  19   Temp: 98.2 F (36.8 C)  97.6 F (36.4 C)   TempSrc:   Oral   SpO2: 98%  100%   Weight:  98 kg    Height:        General - Elderly Caucasian male, no apparent distress HEENT - PERRLA, EOMI, atraumatic head, non tender sinuses. Lung - Clear, basal rales, no rhonchi, wheezes. Heart - S1, S2 heard, no murmurs, rubs, trace pedal edema. Abdomen - Soft, non tender, bowel  sounds good Neuro - Alert, awake and oriented x 3, non focal exam. Skin - Warm and dry.  Data Reviewed:      Latest Ref Rng & Units 06/10/2023    4:17 AM 06/09/2023    3:20 AM 06/08/2023    7:14 AM  CBC  WBC 4.0 - 10.5 K/uL 4.9  7.9  8.2   Hemoglobin 13.0 - 17.0 g/dL 8.1  7.9  8.1   Hematocrit 39.0 - 52.0 % 24.3  23.6  23.8   Platelets  150 - 400 K/uL 17  20  29        Latest Ref Rng & Units 06/10/2023    4:17 AM 06/09/2023    3:20 AM 06/08/2023    7:14 AM  BMP  Glucose 70 - 99 mg/dL 67  86  409   BUN 8 - 23 mg/dL 52  59  57   Creatinine 0.61 - 1.24 mg/dL 8.11  9.14  7.82   Sodium 135 - 145 mmol/L 139  140  139   Potassium 3.5 - 5.1 mmol/L 3.8  3.2  3.7   Chloride 98 - 111 mmol/L 110  109  111   CO2 22 - 32 mmol/L 20  21  18    Calcium 8.9 - 10.3 mg/dL 7.8  7.6  7.2    No results found.  Family Communication: Discussed with patient, understand and agree. He agreed with palliative care.  Disposition: Status is: Inpatient Remains inpatient appropriate because: sepsis, palliative discussion.  Planned Discharge Destination: Skilled nursing facility     MDM level 3- He is sick with multiorgan dysfunction. He has AML with no response to chemo, poor prognosis. Has anemia, low platelets. High risk for sudden clinical deterioration.  Author: Aisha Hove, MD 06/10/2023 11:44 AM Secure chat 7am to 7pm For on call review [email or web address].

## 2023-06-10 NOTE — Plan of Care (Signed)

## 2023-06-10 NOTE — Progress Notes (Signed)
 [facility]  ROUNDING NOTE   Subjective:   [name] was admitted to [facility] on 06/07/2023 for Dehydration [E86.0] Shock circulatory (HCC) [R57.9] Severe anemia [D64.9] Altered mental status, unspecified altered mental status type [R41.82] Acute myeloid leukemia not having achieved remission [facility]) [C92.00]  Patient was admitted to [facility] from 3/26 to 4/4 for acute kidney injury thought to be secondary to chemotherapy or antibiotics. He was discharged with a creatinine of 3.47 and GFR of 17.   Patient laying in bed Alert and oriented No family present at bedside Tolerating meals  Creatinine 3.16 Urine output   Objective:  Vital signs in last 24 hours:  Temp:  [97.6 F (36.4 C)-98.2 F (36.8 C)] 97.6 F (36.4 C) (04/20 0824) Pulse Rate:  [106-113] 113 (04/20 0824) Resp:  [16-19] 19 (04/20 0824) BP: (142-157)/(97-103) 146/102 (04/20 0824) SpO2:  [98 %-100 %] 100 % (04/20 0824) Weight:  [98 kg] 98 kg (04/20 0432)  Weight change: 0.3 kg Filed Weights   06/08/23 0422 06/09/23 0430 06/10/23 0432  Weight: 97.7 kg 97.7 kg 98 kg    Intake/Output: I/O last 3 completed shifts: In: 440 [P.O.:240; IV Piggyback:200] Out: 950 [Urine:950]   Intake/Output this shift:  Total I/O In: 240 [P.O.:240] Out: 300 [Urine:300]  Physical Exam: General: NAD, laying in bed  Head: Normocephalic, atraumatic. Moist oral mucosal membranes  Eyes: Anicteric  Lungs:  Clear to auscultation, normal effort  Heart: Regular rate and rhythm  Abdomen:  Soft, tenderness,   Extremities: No peripheral edema.  Neurologic: Alert and oriented, moving all four extremities  Skin: No lesions       Basic Metabolic Panel: Recent Labs  Lab 06/06/23 1050 06/06/23 1050 06/07/23 0748 06/07/23 1355 06/07/23 1750 06/08/23 0714 06/09/23 0320 06/10/23 0417  NA 135  --  140 140 140 139 140 139  K 3.9  --  3.8 3.6 4.0 3.7 3.2* 3.8  CL 108  --  112* 112* 112* 111 109 110  CO2 17*   --  17* 18* 16* 18* 21* 20*  GLUCOSE 105*  --  104* 127* 143* 139* 86 67*  BUN 56*  --  58* 53* 53* 57* 59* 52*  CREATININE 3.53*   < > 3.84* 3.77* 3.68* 3.66* 3.36* 3.16*  CALCIUM 8.2*  --  8.0* 7.3* 7.3* 7.2* 7.6* 7.8*  MG  --   --  1.3*  --   --  1.7 1.7  --   PHOS 5.8*  --  4.7*  --   --  6.0*  --   --    < > = values in this interval not displayed.    Liver Function Tests: Recent Labs  Lab 06/06/23 1050 06/07/23 0748  AST 16 27  ALT 9 12  ALKPHOS 90 112  BILITOT 0.7 0.8  PROT 7.6 6.7  ALBUMIN  2.1* 1.7*   No results for input(s): "LIPASE", "AMYLASE" in the last 168 hours. No results for input(s): "AMMONIA" in the last 168 hours.  CBC: Recent Labs  Lab 06/06/23 1050 06/06/23 1050 06/07/23 0748 06/07/23 1355 06/08/23 0031 06/08/23 0714 06/09/23 0320 06/10/23 0417  WBC 4.5   < > 8.1 15.4* 8.1 8.2 7.9 4.9  NEUTROABS 1.4*  --  5.7 12.0*  --   --   --   --   HGB 6.2*   < > 5.7* 6.3* 6.8* 8.1* 7.9* 8.1*  HCT 20.8*  --  18.8* 19.7* 20.2* 23.8* 23.6* 24.3*  MCV 93.3  --  93.5 89.1 86.3 85.6 85.5 87.7  PLT 24*   < > 21* 20* 16* 29* 20* 17*   < > = values in this interval not displayed.    Cardiac Enzymes: No results for input(s): "CKTOTAL", "CKMB", "CKMBINDEX", "TROPONINI" in the last 168 hours.   BNP: Invalid input(s): "POCBNP"  CBG: Recent Labs  Lab 06/09/23 2032 06/10/23 0002 06/10/23 0405 06/10/23 0515 06/10/23 0746  GLUCAP 117* 83 70 111* 81    Microbiology: Results for orders placed or performed during the hospital encounter of 06/07/23  Blood culture (routine x 2)     Status: None (Preliminary result)   Collection Time: 06/07/23  7:48 AM   Specimen: BLOOD  Result Value Ref Range Status   Specimen Description BLOOD BLOOD RIGHT ARM  Final   Special Requests   Final    BOTTLES DRAWN AEROBIC AND ANAEROBIC Blood Culture results may not be optimal due to an inadequate volume of blood received in culture bottles   Culture   Final    NO GROWTH 3  DAYS Performed at [facility], [address] [place], [place] [address]    Report Status PENDING  Incomplete  Blood culture (routine x 2)     Status: None (Preliminary result)   Collection Time: 06/07/23  8:05 AM   Specimen: BLOOD  Result Value Ref Range Status   Specimen Description BLOOD BLOOD LEFT ARM  Final   Special Requests   Final    BOTTLES DRAWN AEROBIC AND ANAEROBIC Blood Culture results may not be optimal due to an inadequate volume of blood received in culture bottles   Culture   Final    NO GROWTH 3 DAYS Performed at [facility], [address], [place], [place] [address]    Report Status PENDING  Incomplete  Urine Culture     Status: Abnormal   Collection Time: 06/07/23 10:06 AM   Specimen: Urine, Random  Result Value Ref Range Status   Specimen Description   Final    URINE, RANDOM Performed at [facility], [address] [place], [place] [address]    Special Requests   Final    NONE Reflexed from [id number] Performed at [facility], [address] [place], [place] [address]    Culture (A)  Final    >=100,000 COLONIES/mL KLEBSIELLA PNEUMONIAE Confirmed Extended Spectrum Beta-Lactamase Producer (ESBL).  In bloodstream infections from ESBL organisms, carbapenems are preferred over piperacillin/tazobactam. They are shown to have a lower risk of mortality.    Report Status 06/10/2023 FINAL  Final   Organism ID, Bacteria KLEBSIELLA PNEUMONIAE (A)  Final      Susceptibility   Klebsiella pneumoniae - MIC*    AMPICILLIN >=32 RESISTANT Resistant     CEFAZOLIN  >=64 RESISTANT Resistant     CEFTRIAXONE  32 RESISTANT Resistant     CIPROFLOXACIN <=0.25 SENSITIVE Sensitive     GENTAMICIN <=1 SENSITIVE Sensitive     IMIPENEM <=0.25 SENSITIVE Sensitive     NITROFURANTOIN 128 RESISTANT Resistant     TRIMETH /SULFA  >=320 RESISTANT Resistant     AMPICILLIN/SULBACTAM 4 SENSITIVE Sensitive     * >=100,000 COLONIES/mL KLEBSIELLA PNEUMONIAE   Resp panel by RT-PCR (RSV, Flu A&B, Covid) Anterior Nasal Swab     Status: None   Collection Time: 06/07/23 11:56 AM   Specimen: Anterior Nasal Swab  Result Value Ref Range Status   SARS Coronavirus 2 by RT PCR NEGATIVE NEGATIVE Final    Comment: (NOTE) SARS-CoV-2 target nucleic acids are  NOT DETECTED.  The SARS-CoV-2 RNA is generally detectable in upper respiratory specimens during the acute phase of infection. The lowest concentration of SARS-CoV-2 viral copies this assay can detect is 138 copies/mL. A negative result does not preclude SARS-Cov-2 infection and should not be used as the sole basis for treatment or other patient management decisions. A negative result may occur with  improper specimen collection/handling, submission of specimen other than nasopharyngeal swab, presence of viral mutation(s) within the areas targeted by this assay, and inadequate number of viral copies(<138 copies/mL). A negative result must be combined with clinical observations, patient history, and epidemiological information. The expected result is Negative.  Fact Sheet for Patients:  BloggerCourse.com  Fact Sheet for Healthcare Providers:  SeriousBroker.it  This test is no t yet approved or cleared by the [place]  FDA and  has been authorized for detection and/or diagnosis of SARS-CoV-2 by FDA under an Emergency Use Authorization (EUA). This EUA will remain  in effect (meaning this test can be used) for the duration of the COVID-19 declaration under Section 564(b)(1) of the Act, 21 U.S.C.section 360bbb-3(b)(1), unless the authorization is terminated  or revoked sooner.       Influenza A by PCR NEGATIVE NEGATIVE Final   Influenza B by PCR NEGATIVE NEGATIVE Final    Comment: (NOTE) The Xpert Xpress SARS-CoV-2/FLU/RSV plus assay is intended as an aid in the diagnosis of influenza from Nasopharyngeal swab specimens and should not be used  as a sole basis for treatment. Nasal washings and aspirates are unacceptable for Xpert Xpress SARS-CoV-2/FLU/RSV testing.  Fact Sheet for Patients: BloggerCourse.com  Fact Sheet for Healthcare Providers: SeriousBroker.it  This test is not yet approved or cleared by the [place]  FDA and has been authorized for detection and/or diagnosis of SARS-CoV-2 by FDA under an Emergency Use Authorization (EUA). This EUA will remain in effect (meaning this test can be used) for the duration of the COVID-19 declaration under Section 564(b)(1) of the Act, 21 U.S.C. section 360bbb-3(b)(1), unless the authorization is terminated or revoked.     Resp Syncytial Virus by PCR NEGATIVE NEGATIVE Final    Comment: (NOTE) Fact Sheet for Patients: BloggerCourse.com  Fact Sheet for Healthcare Providers: SeriousBroker.it  This test is not yet approved or cleared by the [place]  FDA and has been authorized for detection and/or diagnosis of SARS-CoV-2 by FDA under an Emergency Use Authorization (EUA). This EUA will remain in effect (meaning this test can be used) for the duration of the COVID-19 declaration under Section 564(b)(1) of the Act, 21 U.S.C. section 360bbb-3(b)(1), unless the authorization is terminated or revoked.  Performed at [facility], [address] [place], [place] [address]     Coagulation Studies: Recent Labs    06/07/23 1355  LABPROT 17.4*  INR 1.4*    Urinalysis: No results for input(s): "COLORURINE", "LABSPEC", "PHURINE", "GLUCOSEU", "HGBUR", "BILIRUBINUR", "KETONESUR", "PROTEINUR", "UROBILINOGEN", "NITRITE", "LEUKOCYTESUR" in the last 72 hours.  Invalid input(s): "APPERANCEUR"     Imaging: No results found.    Medications:    meropenem  (MERREM ) IV 1 g (06/10/23 0927)    acyclovir   400 mg Oral BID   DULoxetine   60 mg Oral BID   ferrous  sulfate  325 mg Oral BID WC   insulin  aspart  0-6 Units Subcutaneous Q4H   latanoprost   1 drop Both Eyes QHS   melatonin  2.5 mg Oral QHS   metoprolol  succinate  50 mg Oral Daily   mirtazapine   15 mg Oral QHS  oxybutynin   10 mg Oral QPM   pantoprazole   40 mg Oral Daily   senna-docusate  2 tablet Oral QHS   simvastatin   20 mg Oral Daily   acetaminophen , docusate sodium , ondansetron  (ZOFRAN ) IV, mouth rinse, polyethylene glycol  Assessment/ Plan:  [name] is a 79 y.o.  male with medical problems of acute myeloid leukoma, history of sigmoid colon cancer status post partial colectomy in November 2024, anemia, diabetes mellitus type II, peripheral arterial disease, chronic systolic congestive heart failure, history of pericardial effusion status post pericardial window. was admitted on 06/07/2023  for Dehydration [E86.0] Shock circulatory (HCC) [R57.9] Severe anemia [D64.9] Altered mental status, unspecified altered mental status type [R41.82] Acute myeloid leukemia not having achieved remission (HCC) [C92.00]   Acute Kidney Injury on chronic kidney disease stage 4 with history of proteinuria. with baseline creatinine 2.25 and GFR of 29 on 05/09/2023. CT in February 2025 showed severely atrophic left kidney. - Creatinine has improved today. - No acute need for dialysis - Will continue to follow up in our office at discharge.   Lab Results  Component Value Date   CREATININE 3.16 (H) 06/10/2023   CREATININE 3.36 (H) 06/09/2023   CREATININE 3.66 (H) 06/08/2023    Intake/Output Summary (Last 24 hours) at 06/10/2023 1100 Last data filed at 06/10/2023 1018 Gross per 24 hour  Intake 480 ml  Output 900 ml  Net -420 ml   2. Anemia of chronic kidney disease with thrombocytopenia Lab Results  Component Value Date   HGB 8.1 (L) 06/10/2023    Patient with history of AML, pancytopenia and multiple blood transfusions in the past.  Appreciate hematology input.  Hgb decreased but  stable.   3.  Chronic systolic heart failure : not in acute exacerbation. - holding furosemide  due to acute kidney injury.     LOS: 3 Alexus Galka 4/20/202511:00 AM

## 2023-06-11 DIAGNOSIS — C9200 Acute myeloblastic leukemia, not having achieved remission: Secondary | ICD-10-CM | POA: Diagnosis not present

## 2023-06-11 DIAGNOSIS — T451X5A Adverse effect of antineoplastic and immunosuppressive drugs, initial encounter: Secondary | ICD-10-CM

## 2023-06-11 DIAGNOSIS — Z7189 Other specified counseling: Secondary | ICD-10-CM

## 2023-06-11 DIAGNOSIS — G928 Other toxic encephalopathy: Secondary | ICD-10-CM

## 2023-06-11 DIAGNOSIS — R579 Shock, unspecified: Secondary | ICD-10-CM | POA: Diagnosis not present

## 2023-06-11 DIAGNOSIS — E876 Hypokalemia: Secondary | ICD-10-CM | POA: Diagnosis not present

## 2023-06-11 DIAGNOSIS — N1831 Chronic kidney disease, stage 3a: Secondary | ICD-10-CM

## 2023-06-11 LAB — CBC
HCT: 24.3 % — ABNORMAL LOW (ref 39.0–52.0)
Hemoglobin: 7.8 g/dL — ABNORMAL LOW (ref 13.0–17.0)
MCH: 28.3 pg (ref 26.0–34.0)
MCHC: 32.1 g/dL (ref 30.0–36.0)
MCV: 88 fL (ref 80.0–100.0)
Platelets: 13 10*3/uL — CL (ref 150–400)
RBC: 2.76 MIL/uL — ABNORMAL LOW (ref 4.22–5.81)
RDW: 18 % — ABNORMAL HIGH (ref 11.5–15.5)
WBC: 4.1 10*3/uL (ref 4.0–10.5)
nRBC: 0 % (ref 0.0–0.2)

## 2023-06-11 LAB — GLUCOSE, CAPILLARY
Glucose-Capillary: 102 mg/dL — ABNORMAL HIGH (ref 70–99)
Glucose-Capillary: 103 mg/dL — ABNORMAL HIGH (ref 70–99)
Glucose-Capillary: 106 mg/dL — ABNORMAL HIGH (ref 70–99)
Glucose-Capillary: 106 mg/dL — ABNORMAL HIGH (ref 70–99)
Glucose-Capillary: 107 mg/dL — ABNORMAL HIGH (ref 70–99)
Glucose-Capillary: 78 mg/dL (ref 70–99)
Glucose-Capillary: 97 mg/dL (ref 70–99)

## 2023-06-11 LAB — BASIC METABOLIC PANEL WITH GFR
Anion gap: 7 (ref 5–15)
BUN: 53 mg/dL — ABNORMAL HIGH (ref 8–23)
CO2: 24 mmol/L (ref 22–32)
Calcium: 7.9 mg/dL — ABNORMAL LOW (ref 8.9–10.3)
Chloride: 110 mmol/L (ref 98–111)
Creatinine, Ser: 2.99 mg/dL — ABNORMAL HIGH (ref 0.61–1.24)
GFR, Estimated: 21 mL/min — ABNORMAL LOW (ref >60)
Glucose, Bld: 102 mg/dL — ABNORMAL HIGH (ref 70–99)
Potassium: 4.1 mmol/L (ref 3.5–5.1)
Sodium: 141 mmol/L (ref 135–145)

## 2023-06-11 NOTE — Care Management Important Message (Signed)
 Important Message  Patient Details  Name: HENRIQUE PAREKH MRN: [id number] Date of Birth: 1945/08/13   Important Message Given:  Yes - Medicare IM     Brookie Cantor, CMA 06/11/2023, 10:34 AM
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
## 2023-06-11 NOTE — Progress Notes (Signed)
 [facility]  ROUNDING NOTE   Subjective:   [name] was admitted to [facility] on 06/07/2023 for Dehydration [E86.0] Shock circulatory (HCC) [R57.9] Severe anemia [D64.9] Altered mental status, unspecified altered mental status type [R41.82] Acute myeloid leukemia not having achieved remission [facility]) [C92.00]  Patient was admitted to [facility] from 3/26 to 4/4 for acute kidney injury thought to be secondary to chemotherapy or antibiotics. He was discharged with a creatinine of 3.47 and GFR of 17.   Update:  Patient resting comfortably in bed. Creatinine down slightly to 2.99. eGFR currently 21. Good urine output 1.4 L over the preceding 24 hours.   Objective:  Vital signs in last 24 hours:  Temp:  [97.7 F (36.5 C)-98.5 F (36.9 C)] 97.7 F (36.5 C) (04/21 0736) Pulse Rate:  [74-113] 79 (04/21 0736) Resp:  [18-19] 18 (04/21 0736) BP: (126-149)/(87-102) 126/91 (04/21 0736) SpO2:  [97 %-100 %] 98 % (04/21 0736) Weight:  [100.4 kg] 100.4 kg (04/21 0500)  Weight change: 2.4 kg Filed Weights   06/09/23 0430 06/10/23 0432 06/11/23 0500  Weight: 97.7 kg 98 kg 100.4 kg    Intake/Output: I/O last 3 completed shifts: In: 1740 [P.O.:1440; IV Piggyback:300] Out: 1425 [Urine:1425]   Intake/Output this shift:  No intake/output data recorded.  Physical Exam: General: NAD, laying in bed  Head: Normocephalic, atraumatic. Moist oral mucosal membranes  Eyes: Anicteric  Lungs:  Clear to auscultation, normal effort  Heart: Regular rate and rhythm  Abdomen:  Soft, tenderness,   Extremities: No peripheral edema.  Neurologic: Alert and oriented, moving all four extremities  Skin: No lesions       Basic Metabolic Panel: Recent Labs  Lab 06/06/23 1050 06/07/23 0748 06/07/23 1355 06/07/23 1750 06/08/23 0714 06/09/23 0320 06/10/23 0417 06/11/23 0604  NA 135 140   < > 140 139 140 139 141  K 3.9 3.8   < > 4.0 3.7 3.2* 3.8 4.1  CL 108 112*   < > 112* 111 109  110 110  CO2 17* 17*   < > 16* 18* 21* 20* 24  GLUCOSE 105* 104*   < > 143* 139* 86 67* 102*  BUN 56* 58*   < > 53* 57* 59* 52* 53*  CREATININE 3.53* 3.84*   < > 3.68* 3.66* 3.36* 3.16* 2.99*  CALCIUM 8.2* 8.0*   < > 7.3* 7.2* 7.6* 7.8* 7.9*  MG  --  1.3*  --   --  1.7 1.7  --   --   PHOS 5.8* 4.7*  --   --  6.0*  --   --   --    < > = values in this interval not displayed.    Liver Function Tests: Recent Labs  Lab 06/06/23 1050 06/07/23 0748  AST 16 27  ALT 9 12  ALKPHOS 90 112  BILITOT 0.7 0.8  PROT 7.6 6.7  ALBUMIN  2.1* 1.7*   No results for input(s): "LIPASE", "AMYLASE" in the last 168 hours. No results for input(s): "AMMONIA" in the last 168 hours.  CBC: Recent Labs  Lab 06/06/23 1050 06/06/23 1050 06/07/23 0748 06/07/23 1355 06/08/23 0031 06/08/23 0714 06/09/23 0320 06/10/23 0417 06/11/23 0604  WBC 4.5   < > 8.1 15.4* 8.1 8.2 7.9 4.9 4.1  NEUTROABS 1.4*  --  5.7 12.0*  --   --   --   --   --   HGB 6.2*   < > 5.7* 6.3* 6.8* 8.1* 7.9* 8.1* 7.8*  HCT 20.8*  --  18.8* 19.7* 20.2* 23.8* 23.6* 24.3* 24.3*  MCV 93.3  --  93.5 89.1 86.3 85.6 85.5 87.7 88.0  PLT 24*   < > 21* 20* 16* 29* 20* 17* 13*   < > = values in this interval not displayed.    Cardiac Enzymes: No results for input(s): "CKTOTAL", "CKMB", "CKMBINDEX", "TROPONINI" in the last 168 hours.   BNP: Invalid input(s): "POCBNP"  CBG: Recent Labs  Lab 06/10/23 1712 06/10/23 2114 06/11/23 0031 06/11/23 0446 06/11/23 0738  GLUCAP 82 125* 102* 106* 97    Microbiology: Results for orders placed or performed during the hospital encounter of 06/07/23  Blood culture (routine x 2)     Status: None (Preliminary result)   Collection Time: 06/07/23  7:48 AM   Specimen: BLOOD  Result Value Ref Range Status   Specimen Description BLOOD BLOOD RIGHT ARM  Final   Special Requests   Final    BOTTLES DRAWN AEROBIC AND ANAEROBIC Blood Culture results may not be optimal due to an inadequate volume of blood  received in culture bottles   Culture   Final    NO GROWTH 4 DAYS Performed at [facility], [address], [place], [place] [address]    Report Status PENDING  Incomplete  Blood culture (routine x 2)     Status: None (Preliminary result)   Collection Time: 06/07/23  8:05 AM   Specimen: BLOOD  Result Value Ref Range Status   Specimen Description BLOOD BLOOD LEFT ARM  Final   Special Requests   Final    BOTTLES DRAWN AEROBIC AND ANAEROBIC Blood Culture results may not be optimal due to an inadequate volume of blood received in culture bottles   Culture   Final    NO GROWTH 4 DAYS Performed at [facility], [address], [place], [place] [address]    Report Status PENDING  Incomplete  Urine Culture     Status: Abnormal   Collection Time: 06/07/23 10:06 AM   Specimen: Urine, Random  Result Value Ref Range Status   Specimen Description   Final    URINE, RANDOM Performed at [facility], [address], [place], [place] [address]    Special Requests   Final    NONE Reflexed from [id number] Performed at [facility], [address] [place], [place] [address]    Culture (A)  Final    >=100,000 COLONIES/mL KLEBSIELLA PNEUMONIAE Confirmed Extended Spectrum Beta-Lactamase Producer (ESBL).  In bloodstream infections from ESBL organisms, carbapenems are preferred over piperacillin/tazobactam. They are shown to have a lower risk of mortality.    Report Status 06/10/2023 FINAL  Final   Organism ID, Bacteria KLEBSIELLA PNEUMONIAE (A)  Final      Susceptibility   Klebsiella pneumoniae - MIC*    AMPICILLIN >=32 RESISTANT Resistant     CEFAZOLIN  >=64 RESISTANT Resistant     CEFTRIAXONE  32 RESISTANT Resistant     CIPROFLOXACIN <=0.25 SENSITIVE Sensitive     GENTAMICIN <=1 SENSITIVE Sensitive     IMIPENEM <=0.25 SENSITIVE Sensitive     NITROFURANTOIN 128 RESISTANT Resistant     TRIMETH /SULFA  >=320 RESISTANT Resistant     AMPICILLIN/SULBACTAM 4  SENSITIVE Sensitive     * >=100,000 COLONIES/mL KLEBSIELLA PNEUMONIAE  Resp panel by RT-PCR (RSV, Flu A&B, Covid) Anterior Nasal Swab     Status: None   Collection Time: 06/07/23 11:56 AM   Specimen: Anterior Nasal Swab  Result Value Ref Range Status  SARS Coronavirus 2 by RT PCR NEGATIVE NEGATIVE Final    Comment: (NOTE) SARS-CoV-2 target nucleic acids are NOT DETECTED.  The SARS-CoV-2 RNA is generally detectable in upper respiratory specimens during the acute phase of infection. The lowest concentration of SARS-CoV-2 viral copies this assay can detect is 138 copies/mL. A negative result does not preclude SARS-Cov-2 infection and should not be used as the sole basis for treatment or other patient management decisions. A negative result may occur with  improper specimen collection/handling, submission of specimen other than nasopharyngeal swab, presence of viral mutation(s) within the areas targeted by this assay, and inadequate number of viral copies(<138 copies/mL). A negative result must be combined with clinical observations, patient history, and epidemiological information. The expected result is Negative.  Fact Sheet for Patients:  BloggerCourse.com  Fact Sheet for Healthcare Providers:  SeriousBroker.it  This test is no t yet approved or cleared by the [place]  FDA and  has been authorized for detection and/or diagnosis of SARS-CoV-2 by FDA under an Emergency Use Authorization (EUA). This EUA will remain  in effect (meaning this test can be used) for the duration of the COVID-19 declaration under Section 564(b)(1) of the Act, 21 U.S.C.section 360bbb-3(b)(1), unless the authorization is terminated  or revoked sooner.       Influenza A by PCR NEGATIVE NEGATIVE Final   Influenza B by PCR NEGATIVE NEGATIVE Final    Comment: (NOTE) The Xpert Xpress SARS-CoV-2/FLU/RSV plus assay is intended as an aid in the diagnosis  of influenza from Nasopharyngeal swab specimens and should not be used as a sole basis for treatment. Nasal washings and aspirates are unacceptable for Xpert Xpress SARS-CoV-2/FLU/RSV testing.  Fact Sheet for Patients: BloggerCourse.com  Fact Sheet for Healthcare Providers: SeriousBroker.it  This test is not yet approved or cleared by the [place]  FDA and has been authorized for detection and/or diagnosis of SARS-CoV-2 by FDA under an Emergency Use Authorization (EUA). This EUA will remain in effect (meaning this test can be used) for the duration of the COVID-19 declaration under Section 564(b)(1) of the Act, 21 U.S.C. section 360bbb-3(b)(1), unless the authorization is terminated or revoked.     Resp Syncytial Virus by PCR NEGATIVE NEGATIVE Final    Comment: (NOTE) Fact Sheet for Patients: BloggerCourse.com  Fact Sheet for Healthcare Providers: SeriousBroker.it  This test is not yet approved or cleared by the [place]  FDA and has been authorized for detection and/or diagnosis of SARS-CoV-2 by FDA under an Emergency Use Authorization (EUA). This EUA will remain in effect (meaning this test can be used) for the duration of the COVID-19 declaration under Section 564(b)(1) of the Act, 21 U.S.C. section 360bbb-3(b)(1), unless the authorization is terminated or revoked.  Performed at [facility], [address] [place], [place] [address]     Coagulation Studies: No results for input(s): "LABPROT", "INR" in the last 72 hours.   Urinalysis: No results for input(s): "COLORURINE", "LABSPEC", "PHURINE", "GLUCOSEU", "HGBUR", "BILIRUBINUR", "KETONESUR", "PROTEINUR", "UROBILINOGEN", "NITRITE", "LEUKOCYTESUR" in the last 72 hours.  Invalid input(s): "APPERANCEUR"     Imaging: No results found.    Medications:    meropenem  (MERREM ) IV 1 g (06/11/23 0034)     acyclovir   400 mg Oral BID   DULoxetine   60 mg Oral BID   ferrous sulfate   325 mg Oral BID WC   insulin  aspart  0-6 Units Subcutaneous Q4H   latanoprost   1 drop Both Eyes QHS   melatonin  2.5 mg Oral QHS  metoprolol  succinate  50 mg Oral Daily   mirtazapine   15 mg Oral QHS   oxybutynin   10 mg Oral QPM   pantoprazole   40 mg Oral Daily   senna-docusate  2 tablet Oral QHS   simvastatin   20 mg Oral Daily   acetaminophen , docusate sodium , ondansetron  (ZOFRAN ) IV, mouth rinse, polyethylene glycol  Assessment/ Plan:  [name] is a 78 y.o.  male with medical problems of acute myeloid leukoma, history of sigmoid colon cancer status post partial colectomy in November 2024, anemia, diabetes mellitus type II, peripheral arterial disease, chronic systolic congestive heart failure, history of pericardial effusion status post pericardial window. was admitted on 06/07/2023  for Dehydration [E86.0] Shock circulatory (HCC) [R57.9] Severe anemia [D64.9] Altered mental status, unspecified altered mental status type [R41.82] Acute myeloid leukemia not having achieved remission (HCC) [C92.00]   Acute Kidney Injury on chronic kidney disease stage 4 with history of proteinuria. with baseline creatinine 2.25 and GFR of 29 on 05/09/2023. CT in February 2025 showed severely atrophic left kidney. - Renal function has improved.  Creatinine down to 2.9 with EGFR of 29.  Continue to monitor renal parameters daily for now.  No immediate need for dialysis.  Lab Results  Component Value Date   CREATININE 2.99 (H) 06/11/2023   CREATININE 3.16 (H) 06/10/2023   CREATININE 3.36 (H) 06/09/2023    Intake/Output Summary (Last 24 hours) at 06/11/2023 0829 Last data filed at 06/11/2023 0649 Gross per 24 hour  Intake 1500 ml  Output 1425 ml  Net 75 ml   2. Anemia of chronic kidney disease with thrombocytopenia Lab Results  Component Value Date   HGB 7.8 (L) 06/11/2023    Patient with history of AML,  pancytopenia and multiple blood transfusions in the past.  Hemoglobin down slightly to 7.8.  No immediate need for transfusion but would defer further management to hematology.  3.  Chronic systolic heart failure : not in acute exacerbation. - holding furosemide  due to acute kidney injury.     LOS: 4 Alexiz Cothran 4/21/20258:29 AM

## 2023-06-11 NOTE — Plan of Care (Signed)
  Problem: Clinical Measurements: Goal: Will remain free from infection Outcome: Progressing Goal: Diagnostic test results will improve Outcome: Progressing Goal: Respiratory complications will improve Outcome: Progressing Goal: Cardiovascular complication will be avoided Outcome: Progressing   Problem: Coping: Goal: Level of anxiety will decrease Outcome: Progressing   Problem: Elimination: Goal: Will not experience complications related to bowel motility Outcome: Progressing Goal: Will not experience complications related to urinary retention Outcome: Progressing   Problem: Pain Managment: Goal: General experience of comfort will improve and/or be controlled Outcome: Progressing   Problem: Skin Integrity: Goal: Risk for impaired skin integrity will decrease Outcome: Progressing   Problem: Coping: Goal: Ability to adjust to condition or change in health will improve Outcome: Progressing

## 2023-06-11 NOTE — TOC Progression Note (Signed)
 Transition of Care [facility]) - Progression Note    Patient Details  Name: Robert [name] MRN: [id number] Date of Birth: 02-15-1946  Transition [facility]) CM/SW Contact  Crayton Docker, RN Phone Number: 06/11/2023, 5:13 PM  Clinical Narrative:     CM to patient's room regarding SNF--short term rehab recommendations. Patient states lives at [facility]. Patient states anticipated to return. CM secure message to Antony Baumgartner regarding recs for short term rehab and is patient anticipated to return to SNF. CM will continue to follow for discharge care planning needs.   Expected Discharge Plan: Long Term Nursing Home Barriers to Discharge: Continued Medical Work up  Expected Discharge Plan and Services    SNF   Living arrangements for the past 2 months: Skilled Nursing Facility                  Social Determinants of Health (SDOH) Interventions SDOH Screenings   Food Insecurity: No Food Insecurity (06/07/2023)  Housing: Low Risk  (06/07/2023)  Transportation Needs: No Transportation Needs (06/07/2023)  Utilities: Not At Risk (06/07/2023)  Alcohol Screen: Low Risk  (10/29/2020)  Depression (PHQ2-9): Low Risk  (09/12/2021)  Financial Resource Strain: Low Risk  (12/15/2022)   Received from [facility]  Physical Activity: Inactive (10/29/2020)  Social Connections: Patient Declined (06/07/2023)  Stress: No Stress Concern Present (10/29/2020)  Tobacco Use: Low Risk  (06/07/2023)    Readmission Risk Interventions    04/04/2023   12:56 PM 02/07/2023   12:33 PM  Readmission Risk Prevention Plan  Transportation Screening Complete Complete  Medication Review (RN Care Manager) Complete Complete  PCP or Specialist appointment within 3-5 days of discharge Complete Complete  SW Recovery Care/Counseling Consult Complete Complete  Palliative Care Screening Not Applicable Not Applicable  Skilled Nursing Facility Complete Complete

## 2023-06-11 NOTE — Plan of Care (Signed)

## 2023-06-11 NOTE — Progress Notes (Signed)
 Progress Note   Patient: Robert [name] GEX:[id number] DOB: [birth year] DOA: 06/07/2023     4 DOS: the patient was seen and examined on 06/11/2023   Brief hospital course: 78 y.o. male with PMHx significant for acute myeloblastic leukemia and iron  deficiency anemia, he is currently receiving antineoplastic chemotherapy (most recent treatment 04/16)  He was scheduled to receive 1 unit of pRBC's on 04/17 for hgb of 6.2.  However, he presented to the ER prior to receiving pRBC's due to altered mental status.  Admitted with Acute Metabolic Encephalopathy, Hypovolemic/Septic shock, and Severe Sepsis due to UTI.  Has received 4 units of pRBCs and 1 of platelets this admission.  Weaned off Levophed , hemodynamically stable transferred to TRH service.  Palliative team discussed with him - agreed with DNR. He would like to return to facility with comfort care, does not wish to continue chemotherapy.  Assessment and Plan: Acute toxic metabolic encephalopathy ~ improved Patient is back to baseline mental status. Avoid sedatives and hypnotics. Delirium precautions.   Shock: hypovolemic and septic shock ~ Resolved Continuous cardiac monitoring. BP improved now, stop IV fluids Lactic acid has normalized Troponin negative. Resumed beta blocker as BP improved.   Acute kidney injury superimposed on CKD stage IIIa  Anion gap metabolic acidosis ~ improving Lactic acidosis  ~ resolved Hypomagnesia  Hypokalemia Hyperphosphatemia  Monitor I&O's / urinary output Follow BMP Ensure adequate renal perfusion Avoid nephrotoxic agents. Replace electrolytes as indicated ~ Pharmacy following for assistance with electrolyte replacement Nephrology on board, appreciate input, his kidney function improving.   Sepsis in setting of UTI PMhx of ESBL UTI  Continue empiric Meropenem , pending cultures & sensitivities   Acute myeloid leukemia currently receiving antineoplastic chemotherapy not having achieved  remission  Symptomatic anemia  Thrombocytopenia  Trend CBC, Hb 8.1, platelet 17. Transfuse for hgb <7 and/or platelet count of less than 10K, or active signs of bleeding with platelet count less than 50K Monitor for any bleeding  S/p 2 doses of Decitabine . Oncologist evaluation appreciated, advised palliative care.  Goals of care discussion, patient agreed with DNR, orders placed. He wish to go back to [facility] with comfort care.  Type II diabetes mellitus  Follow hypo/hyperglycemic protocol. Continue accucheks sliding scale.    Out of bed to chair. Incentive spirometry. Nursing supportive care. Fall, aspiration precautions. Diet:  Diet Orders (From admission, onward)     Start     Ordered   06/08/23 0920  Diet heart healthy/carb modified Room service appropriate? Yes; Fluid consistency: Thin  Diet effective now       Question Answer Comment  Diet-HS Snack? Nothing   Room service appropriate? Yes   Fluid consistency: Thin      06/08/23 0919           DVT prophylaxis: SCDs Start: 06/07/23 1059  Level of care: Med-Surg   Code Status: Limited: Do not attempt resuscitation (DNR) -DNR-LIMITED -Do Not Intubate/DNI  Palliative team on board discussed with patient, wife agree with comfort measures upon discharge to facility.  Subjective: Patient is seen and examined today morning. He feels better today. No complaints. Weak, did not get out of bed. Wishes to go back to [facility] with comfort measures only.    Physical Exam: Vitals:   06/11/23 0445 06/11/23 0500 06/11/23 0736 06/11/23 1454  BP: (!) 149/94  (!) 126/91 (!) 148/96  Pulse: 74  79 78  Resp: 18  18 18   Temp: 97.8 F (36.6 C)  97.7 F (36.5  C) (!) 97.4 F (36.3 C)  TempSrc:   Oral Oral  SpO2: 100%  98% 100%  Weight:  100.4 kg    Height:        General - Elderly Caucasian male, no apparent distress HEENT - PERRLA, EOMI, atraumatic head, non tender sinuses. Lung - Clear, basal rales, no rhonchi,  wheezes. Heart - S1, S2 heard, no murmurs, rubs, trace pedal edema. Abdomen - Soft, non tender, bowel sounds good Neuro - Alert, awake and oriented x 3, non focal exam. Skin - Warm and dry.  Data Reviewed:      Latest Ref Rng & Units 06/11/2023    6:04 AM 06/10/2023    4:17 AM 06/09/2023    3:20 AM  CBC  WBC 4.0 - 10.5 K/uL 4.1  4.9  7.9   Hemoglobin 13.0 - 17.0 g/dL 7.8  8.1  7.9   Hematocrit 39.0 - 52.0 % 24.3  24.3  23.6   Platelets 150 - 400 K/uL 13  17  20        Latest Ref Rng & Units 06/11/2023    6:04 AM 06/10/2023    4:17 AM 06/09/2023    3:20 AM  BMP  Glucose 70 - 99 mg/dL 956  67  86   BUN 8 - 23 mg/dL 53  52  59   Creatinine 0.61 - 1.24 mg/dL 2.13  0.86  5.78   Sodium 135 - 145 mmol/L 141  139  140   Potassium 3.5 - 5.1 mmol/L 4.1  3.8  3.2   Chloride 98 - 111 mmol/L 110  110  109   CO2 22 - 32 mmol/L 24  20  21    Calcium 8.9 - 10.3 mg/dL 7.9  7.8  7.6    No results found.  Family Communication: Discussed with patient, understand and agree. He agreed with palliative care.  Disposition: Status is: Inpatient Remains inpatient appropriate because: sepsis, SNF with palliative follow up.  Planned Discharge Destination: Skilled nursing facility     Time spent .  Author: Aisha Hove, MD 06/11/2023 3:20 PM Secure chat 7am to 7pm For on call review [email or web address].

## 2023-06-12 DIAGNOSIS — G928 Other toxic encephalopathy: Secondary | ICD-10-CM | POA: Diagnosis not present

## 2023-06-12 DIAGNOSIS — C9200 Acute myeloblastic leukemia, not having achieved remission: Secondary | ICD-10-CM | POA: Diagnosis not present

## 2023-06-12 DIAGNOSIS — E876 Hypokalemia: Secondary | ICD-10-CM | POA: Diagnosis not present

## 2023-06-12 DIAGNOSIS — R579 Shock, unspecified: Secondary | ICD-10-CM | POA: Diagnosis not present

## 2023-06-12 DIAGNOSIS — Z7189 Other specified counseling: Secondary | ICD-10-CM | POA: Diagnosis not present

## 2023-06-12 LAB — GLUCOSE, CAPILLARY
Glucose-Capillary: 113 mg/dL — ABNORMAL HIGH (ref 70–99)
Glucose-Capillary: 133 mg/dL — ABNORMAL HIGH (ref 70–99)
Glucose-Capillary: 80 mg/dL (ref 70–99)
Glucose-Capillary: 77 mg/dL (ref 70–99)
Glucose-Capillary: 83 mg/dL (ref 70–99)

## 2023-06-12 LAB — BASIC METABOLIC PANEL WITH GFR
Anion gap: 9 (ref 5–15)
BUN: 53 mg/dL — ABNORMAL HIGH (ref 8–23)
CO2: 23 mmol/L (ref 22–32)
Calcium: 8.3 mg/dL — ABNORMAL LOW (ref 8.9–10.3)
Chloride: 110 mmol/L (ref 98–111)
Creatinine, Ser: 2.72 mg/dL — ABNORMAL HIGH (ref 0.61–1.24)
GFR, Estimated: 23 mL/min — ABNORMAL LOW (ref >60)
Glucose, Bld: 123 mg/dL — ABNORMAL HIGH (ref 70–99)
Potassium: 4.2 mmol/L (ref 3.5–5.1)
Sodium: 142 mmol/L (ref 135–145)

## 2023-06-12 LAB — CULTURE, BLOOD (ROUTINE X 2)
Culture: NO GROWTH
Culture: NO GROWTH

## 2023-06-12 LAB — CBC
HCT: 25.1 % — ABNORMAL LOW (ref 39.0–52.0)
Hemoglobin: 8.1 g/dL — ABNORMAL LOW (ref 13.0–17.0)
MCH: 28.1 pg (ref 26.0–34.0)
MCHC: 32.3 g/dL (ref 30.0–36.0)
MCV: 87.2 fL (ref 80.0–100.0)
Platelets: 11 10*3/uL — CL (ref 150–400)
RBC: 2.88 MIL/uL — ABNORMAL LOW (ref 4.22–5.81)
RDW: 17.4 % — ABNORMAL HIGH (ref 11.5–15.5)
WBC: 3.6 10*3/uL — ABNORMAL LOW (ref 4.0–10.5)
nRBC: 0 % (ref 0.0–0.2)

## 2023-06-12 NOTE — Progress Notes (Signed)
 [facility]  ROUNDING NOTE   Subjective:   [name] was admitted to [facility] on 06/07/2023 for Dehydration [E86.0] Shock circulatory (HCC) [R57.9] Severe anemia [D64.9] Altered mental status, unspecified altered mental status type [R41.82] Acute myeloid leukemia not having achieved remission [facility]) [C92.00]  Patient was admitted to [facility] from 3/26 to 4/4 for acute kidney injury thought to be secondary to chemotherapy or antibiotics. He was discharged with a creatinine of 3.47 and GFR of 17.   Update:  Patient seen laying in bed Alert and oriented Tolerating meals  Creatinine 2.72   Objective:  Vital signs in last 24 hours:  Temp:  [97.4 F (36.3 C)-98.8 F (37.1 C)] 98.1 F (36.7 C) (04/22 0827) Pulse Rate:  [78-81] 81 (04/22 0827) Resp:  [16-19] 16 (04/22 0827) BP: (142-148)/(91-102) 148/102 (04/22 0827) SpO2:  [100 %] 100 % (04/22 0827)  Weight change:  Filed Weights   06/09/23 0430 06/10/23 0432 06/11/23 0500  Weight: 97.7 kg 98 kg 100.4 kg    Intake/Output: I/O last 3 completed shifts: In: 2060 [P.O.:1560; IV Piggyback:500] Out: 2875 [Urine:2875]   Intake/Output this shift:  Total I/O In: 240 [P.O.:240] Out: -   Physical Exam: General: NAD, laying in bed  Head: Normocephalic, atraumatic. Moist oral mucosal membranes  Eyes: Anicteric  Lungs:  Clear to auscultation, normal effort  Heart: Regular rate and rhythm  Abdomen:  Soft, tenderness,   Extremities: No peripheral edema.  Neurologic: Alert and oriented, moving all four extremities  Skin: No lesions       Basic Metabolic Panel: Recent Labs  Lab 06/06/23 1050 06/07/23 0748 06/07/23 1355 06/08/23 0714 06/09/23 0320 06/10/23 0417 06/11/23 0604 06/12/23 0602  NA 135 140   < > 139 140 139 141 142  K 3.9 3.8   < > 3.7 3.2* 3.8 4.1 4.2  CL 108 112*   < > 111 109 110 110 110  CO2 17* 17*   < > 18* 21* 20* 24 23  GLUCOSE 105* 104*   < > 139* 86 67* 102* 123*  BUN 56* 58*    < > 57* 59* 52* 53* 53*  CREATININE 3.53* 3.84*   < > 3.66* 3.36* 3.16* 2.99* 2.72*  CALCIUM 8.2* 8.0*   < > 7.2* 7.6* 7.8* 7.9* 8.3*  MG  --  1.3*  --  1.7 1.7  --   --   --   PHOS 5.8* 4.7*  --  6.0*  --   --   --   --    < > = values in this interval not displayed.    Liver Function Tests: Recent Labs  Lab 06/06/23 1050 06/07/23 0748  AST 16 27  ALT 9 12  ALKPHOS 90 112  BILITOT 0.7 0.8  PROT 7.6 6.7  ALBUMIN  2.1* 1.7*   No results for input(s): "LIPASE", "AMYLASE" in the last 168 hours. No results for input(s): "AMMONIA" in the last 168 hours.  CBC: Recent Labs  Lab 06/06/23 1050 06/06/23 1050 06/07/23 0748 06/07/23 1355 06/08/23 0031 06/08/23 0714 06/09/23 0320 06/10/23 0417 06/11/23 0604 06/12/23 0602  WBC 4.5   < > 8.1 15.4*   < > 8.2 7.9 4.9 4.1 3.6*  NEUTROABS 1.4*  --  5.7 12.0*  --   --   --   --   --   --   HGB 6.2*   < > 5.7* 6.3*   < > 8.1* 7.9* 8.1* 7.8* 8.1*  HCT 20.8*  --  18.8* 19.7*   < > 23.8* 23.6* 24.3* 24.3* 25.1*  MCV 93.3  --  93.5 89.1   < > 85.6 85.5 87.7 88.0 87.2  PLT 24*   < > 21* 20*   < > 29* 20* 17* 13* 11*   < > = values in this interval not displayed.    Cardiac Enzymes: No results for input(s): "CKTOTAL", "CKMB", "CKMBINDEX", "TROPONINI" in the last 168 hours.   BNP: Invalid input(s): "POCBNP"  CBG: Recent Labs  Lab 06/11/23 2019 06/11/23 2355 06/12/23 0427 06/12/23 0825 06/12/23 1135  GLUCAP 103* 78 113* 133* 80    Microbiology: Results for orders placed or performed during the hospital encounter of 06/07/23  Blood culture (routine x 2)     Status: None   Collection Time: 06/07/23  7:48 AM   Specimen: BLOOD  Result Value Ref Range Status   Specimen Description BLOOD BLOOD RIGHT ARM  Final   Special Requests   Final    BOTTLES DRAWN AEROBIC AND ANAEROBIC Blood Culture results may not be optimal due to an inadequate volume of blood received in culture bottles   Culture   Final    NO GROWTH 5 DAYS Performed at  [facility], [address] [place], [place] [address]    Report Status 06/12/2023 FINAL  Final  Blood culture (routine x 2)     Status: None   Collection Time: 06/07/23  8:05 AM   Specimen: BLOOD  Result Value Ref Range Status   Specimen Description BLOOD BLOOD LEFT ARM  Final   Special Requests   Final    BOTTLES DRAWN AEROBIC AND ANAEROBIC Blood Culture results may not be optimal due to an inadequate volume of blood received in culture bottles   Culture   Final    NO GROWTH 5 DAYS Performed at [facility], [address] [place], [place] [address]    Report Status 06/12/2023 FINAL  Final  Urine Culture     Status: Abnormal   Collection Time: 06/07/23 10:06 AM   Specimen: Urine, Random  Result Value Ref Range Status   Specimen Description   Final    URINE, RANDOM Performed at [facility], [address] [place], [place] [address]    Special Requests   Final    NONE Reflexed from [id number] Performed at [facility], [address] [place], [place] [address]    Culture (A)  Final    >=100,000 COLONIES/mL KLEBSIELLA PNEUMONIAE Confirmed Extended Spectrum Beta-Lactamase Producer (ESBL).  In bloodstream infections from ESBL organisms, carbapenems are preferred over piperacillin/tazobactam. They are shown to have a lower risk of mortality.    Report Status 06/10/2023 FINAL  Final   Organism ID, Bacteria KLEBSIELLA PNEUMONIAE (A)  Final      Susceptibility   Klebsiella pneumoniae - MIC*    AMPICILLIN >=32 RESISTANT Resistant     CEFAZOLIN  >=64 RESISTANT Resistant     CEFTRIAXONE  32 RESISTANT Resistant     CIPROFLOXACIN <=0.25 SENSITIVE Sensitive     GENTAMICIN <=1 SENSITIVE Sensitive     IMIPENEM <=0.25 SENSITIVE Sensitive     NITROFURANTOIN 128 RESISTANT Resistant     TRIMETH /SULFA  >=320 RESISTANT Resistant     AMPICILLIN/SULBACTAM 4 SENSITIVE Sensitive     * >=100,000 COLONIES/mL KLEBSIELLA PNEUMONIAE  Resp panel by RT-PCR (RSV,  Flu A&B, Covid) Anterior Nasal Swab     Status: None   Collection Time: 06/07/23 11:56 AM   Specimen: Anterior Nasal Swab  Result  Value Ref Range Status   SARS Coronavirus 2 by RT PCR NEGATIVE NEGATIVE Final    Comment: (NOTE) SARS-CoV-2 target nucleic acids are NOT DETECTED.  The SARS-CoV-2 RNA is generally detectable in upper respiratory specimens during the acute phase of infection. The lowest concentration of SARS-CoV-2 viral copies this assay can detect is 138 copies/mL. A negative result does not preclude SARS-Cov-2 infection and should not be used as the sole basis for treatment or other patient management decisions. A negative result may occur with  improper specimen collection/handling, submission of specimen other than nasopharyngeal swab, presence of viral mutation(s) within the areas targeted by this assay, and inadequate number of viral copies(<138 copies/mL). A negative result must be combined with clinical observations, patient history, and epidemiological information. The expected result is Negative.  Fact Sheet for Patients:  BloggerCourse.com  Fact Sheet for Healthcare Providers:  SeriousBroker.it  This test is no t yet approved or cleared by the [place]  FDA and  has been authorized for detection and/or diagnosis of SARS-CoV-2 by FDA under an Emergency Use Authorization (EUA). This EUA will remain  in effect (meaning this test can be used) for the duration of the COVID-19 declaration under Section 564(b)(1) of the Act, 21 U.S.C.section 360bbb-3(b)(1), unless the authorization is terminated  or revoked sooner.       Influenza A by PCR NEGATIVE NEGATIVE Final   Influenza B by PCR NEGATIVE NEGATIVE Final    Comment: (NOTE) The Xpert Xpress SARS-CoV-2/FLU/RSV plus assay is intended as an aid in the diagnosis of influenza from Nasopharyngeal swab specimens and should not be used as a sole basis for  treatment. Nasal washings and aspirates are unacceptable for Xpert Xpress SARS-CoV-2/FLU/RSV testing.  Fact Sheet for Patients: BloggerCourse.com  Fact Sheet for Healthcare Providers: SeriousBroker.it  This test is not yet approved or cleared by the [place]  FDA and has been authorized for detection and/or diagnosis of SARS-CoV-2 by FDA under an Emergency Use Authorization (EUA). This EUA will remain in effect (meaning this test can be used) for the duration of the COVID-19 declaration under Section 564(b)(1) of the Act, 21 U.S.C. section 360bbb-3(b)(1), unless the authorization is terminated or revoked.     Resp Syncytial Virus by PCR NEGATIVE NEGATIVE Final    Comment: (NOTE) Fact Sheet for Patients: BloggerCourse.com  Fact Sheet for Healthcare Providers: SeriousBroker.it  This test is not yet approved or cleared by the [place]  FDA and has been authorized for detection and/or diagnosis of SARS-CoV-2 by FDA under an Emergency Use Authorization (EUA). This EUA will remain in effect (meaning this test can be used) for the duration of the COVID-19 declaration under Section 564(b)(1) of the Act, 21 U.S.C. section 360bbb-3(b)(1), unless the authorization is terminated or revoked.  Performed at [facility], [address] [place], [place] [address]     Coagulation Studies: No results for input(s): "LABPROT", "INR" in the last 72 hours.   Urinalysis: No results for input(s): "COLORURINE", "LABSPEC", "PHURINE", "GLUCOSEU", "HGBUR", "BILIRUBINUR", "KETONESUR", "PROTEINUR", "UROBILINOGEN", "NITRITE", "LEUKOCYTESUR" in the last 72 hours.  Invalid input(s): "APPERANCEUR"     Imaging: No results found.    Medications:      acyclovir   400 mg Oral BID   DULoxetine   60 mg Oral BID   ferrous sulfate   325 mg Oral BID WC   insulin  aspart  0-6 Units  Subcutaneous Q4H   latanoprost   1 drop Both Eyes QHS   melatonin  2.5 mg Oral QHS   metoprolol  succinate  50 mg Oral Daily   mirtazapine   15 mg Oral QHS   oxybutynin   10 mg Oral QPM   pantoprazole   40 mg Oral Daily   senna-docusate  2 tablet Oral QHS   simvastatin   20 mg Oral Daily   acetaminophen , docusate sodium , ondansetron  (ZOFRAN ) IV, mouth rinse, polyethylene glycol  Assessment/ Plan:  [name] is a 78 y.o.  male with medical problems of acute myeloid leukoma, history of sigmoid colon cancer status post partial colectomy in November 2024, anemia, diabetes mellitus type II, peripheral arterial disease, chronic systolic congestive heart failure, history of pericardial effusion status post pericardial window. was admitted on 06/07/2023  for Dehydration [E86.0] Shock circulatory (HCC) [R57.9] Severe anemia [D64.9] Altered mental status, unspecified altered mental status type [R41.82] Acute myeloid leukemia not having achieved remission (HCC) [C92.00]   Acute Kidney Injury on chronic kidney disease stage 4 with history of proteinuria. with baseline creatinine 2.25 and GFR of 29 on 05/09/2023. CT in February 2025 showed severely atrophic left kidney. - Creatinine continues to improve towards baseline. Good urine output. No acute indication for dialysis.   Lab Results  Component Value Date   CREATININE 2.72 (H) 06/12/2023   CREATININE 2.99 (H) 06/11/2023   CREATININE 3.16 (H) 06/10/2023    Intake/Output Summary (Last 24 hours) at 06/12/2023 1156 Last data filed at 06/12/2023 0900 Gross per 24 hour  Intake 920 ml  Output 1950 ml  Net -1030 ml   2. Anemia of chronic kidney disease with thrombocytopenia Lab Results  Component Value Date   HGB 8.1 (L) 06/12/2023    Patient with history of AML, pancytopenia and multiple blood transfusions in the past.  Hemoglobin 8.1.  Will defer management to hematology.  3.  Chronic systolic heart failure : not in acute  exacerbation. - holding furosemide  due to acute kidney injury. Volume stable at this time    LOS: 5 Murtaza Shell 4/22/202511:56 AM

## 2023-06-12 NOTE — Plan of Care (Signed)

## 2023-06-12 NOTE — Progress Notes (Signed)
 Per Dr. Butch Cashing, no need for cardiac monitoring at this time.

## 2023-06-12 NOTE — Progress Notes (Signed)
 PT Cancellation Note  Patient Details Name: RASTUS BORTON MRN: [id number] DOB: 07/13/1945   Cancelled Treatment:     PT eval and treat  not completed today 2/2 to Platelets level 11. PT my chart messaged the MD and agrees with PT. Pt was advised to hold PT for today. Will attempt again tomorrow if pt is appropriate.   Arvella Massingale H Stella Encarnacion 06/12/2023, 11:03 AM

## 2023-06-12 NOTE — Plan of Care (Signed)
  Problem: Clinical Measurements: Goal: Will remain free from infection Outcome: Progressing Goal: Diagnostic test results will improve Outcome: Progressing   Problem: Nutrition: Goal: Adequate nutrition will be maintained Outcome: Progressing   Problem: Coping: Goal: Level of anxiety will decrease Outcome: Progressing   Problem: Elimination: Goal: Will not experience complications related to urinary retention Outcome: Progressing   Problem: Pain Managment: Goal: General experience of comfort will improve and/or be controlled Outcome: Progressing   Problem: Safety: Goal: Ability to remain free from injury will improve Outcome: Progressing   Problem: Skin Integrity: Goal: Risk for impaired skin integrity will decrease Outcome: Progressing   Problem: Fluid Volume: Goal: Ability to maintain a balanced intake and output will improve Outcome: Progressing   Problem: Nutritional: Goal: Maintenance of adequate nutrition will improve Outcome: Progressing   Problem: Skin Integrity: Goal: Risk for impaired skin integrity will decrease Outcome: Progressing   Problem: Tissue Perfusion: Goal: Adequacy of tissue perfusion will improve Outcome: Progressing

## 2023-06-12 NOTE — TOC Progression Note (Signed)
 Transition of Care [facility]) - Progression Note    Patient Details  Name: CHRISTIANO BLANDON MRN: [id number] Date of Birth: 07-Dec-1945  [facility] CM/SW Contact  Crayton Docker, RN Phone Number: 06/12/2023, 10:59 AM  Clinical Narrative:     CM initiated auth for short term rehab. Siegfried Dress is pending ref# is [id number]. CM will follow up.    Expected Discharge Plan: Long Term Nursing Home Barriers to Discharge: Continued Medical Work up  Expected Discharge Plan and Services       Living arrangements for the past 2 months: Skilled Nursing Facility                   Social Determinants of Health (SDOH) Interventions SDOH Screenings   Food Insecurity: No Food Insecurity (06/07/2023)  Housing: Low Risk  (06/07/2023)  Transportation Needs: No Transportation Needs (06/07/2023)  Utilities: Not At Risk (06/07/2023)  Alcohol Screen: Low Risk  (10/29/2020)  Depression (PHQ2-9): Low Risk  (09/12/2021)  Financial Resource Strain: Low Risk  (12/15/2022)   Received from [facility]  Physical Activity: Inactive (10/29/2020)  Social Connections: Patient Declined (06/07/2023)  Stress: No Stress Concern Present (10/29/2020)  Tobacco Use: Low Risk  (06/07/2023)    Readmission Risk Interventions    04/04/2023   12:56 PM 02/07/2023   12:33 PM  Readmission Risk Prevention Plan  Transportation Screening Complete Complete  Medication Review (RN Care Manager) Complete Complete  PCP or Specialist appointment within 3-5 days of discharge Complete Complete  SW Recovery Care/Counseling Consult Complete Complete  Palliative Care Screening Not Applicable Not Applicable  Skilled Nursing Facility Complete Complete
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
## 2023-06-12 NOTE — Progress Notes (Addendum)
 Progress Note   Patient: Robert [name] WUJ:[id number] DOB: 1945/11/14 DOA: 06/07/2023     5 DOS: the patient was seen and examined on 06/12/2023   Brief hospital course: 78 y.o. male with PMHx significant for acute myeloblastic leukemia and iron  deficiency anemia, he is currently receiving antineoplastic chemotherapy (most recent treatment 04/16)  He was scheduled to receive 1 unit of pRBC's on 04/17 for hgb of 6.2.  However, he presented to the ER prior to receiving pRBC's due to altered mental status.  Admitted with Acute Metabolic Encephalopathy, Hypovolemic/Septic shock, and Severe Sepsis due to UTI.  Has received 4 units of pRBCs and 1 of platelets this admission.  Weaned off Levophed , hemodynamically stable transferred to TRH service.  Palliative team discussed with him - agreed with DNR. He would like to return to facility with comfort care, does not wish to continue chemotherapy. Awaiting auth.  Assessment and Plan: Acute toxic metabolic encephalopathy ~ improved Patient is back to baseline mental status. Avoid sedatives and hypnotics. Delirium precautions.   Shock: hypovolemic and septic shock ~ Resolved BP improved now, stop IV fluids Lactic acid has normalized Troponin negative. Resumed beta blocker as BP improved.   Acute kidney injury superimposed on CKD stage IIIa  Anion gap metabolic acidosis ~ improving Lactic acidosis  ~ resolved Hypomagnesia  Hypokalemia Hyperphosphatemia  Ensure adequate renal perfusion Avoid nephrotoxic agents. Replaced electrolytes as indicated Nephrology on board, appreciate input, his kidney function improving.   Sepsis in setting of UTI PMhx of ESBL UTI  Finished 7 days of empiric Meropenem .   Acute myeloid leukemia currently receiving antineoplastic chemotherapy not having achieved remission  Symptomatic anemia  Thrombocytopenia  Trend CBC, Hb 8.1, platelet 11. Monitor for any bleeding  S/p 2 doses of Decitabine . Oncologist  evaluation appreciated, advised palliative care.  Goals of care discussion, patient agreed with DNR, orders placed. He wish to go back to [facility] with comfort care.  Type II diabetes mellitus  Continue accucheks sliding scale.    Out of bed to chair. Incentive spirometry. Nursing supportive care. Fall, aspiration precautions. Diet:  Diet Orders (From admission, onward)     Start     Ordered   06/08/23 0920  Diet heart healthy/carb modified Room service appropriate? Yes; Fluid consistency: Thin  Diet effective now       Question Answer Comment  Diet-HS Snack? Nothing   Room service appropriate? Yes   Fluid consistency: Thin      06/08/23 0919           DVT prophylaxis: SCDs Start: 06/07/23 1059  Level of care: Med-Surg   Code Status: Limited: Do not attempt resuscitation (DNR) -DNR-LIMITED -Do Not Intubate/DNI  Palliative team on board discussed with patient, wife agree with comfort measures upon discharge to facility.  Subjective: Patient is seen and examined today morning. He is in good spirits. No complaints. Did not get out of bed. Agrees with plan to go back to [facility] with comfort measures only.    Physical Exam: Vitals:   06/11/23 1454 06/11/23 2017 06/12/23 0531 06/12/23 0827  BP: (!) 148/96 (!) 142/91 (!) 144/95 (!) 148/102  Pulse: 78 79 81 81  Resp: 18 16 19 16   Temp: (!) 97.4 F (36.3 C) 98.8 F (37.1 C) (!) 97.4 F (36.3 C) 98.1 F (36.7 C)  TempSrc: Oral  Oral   SpO2: 100% 100% 100% 100%  Weight:      Height:        General -  Elderly Caucasian male, no apparent distress HEENT - PERRLA, EOMI, atraumatic head, non tender sinuses. Lung - Clear, basal rales, no rhonchi, wheezes. Heart - S1, S2 heard, no murmurs, rubs, trace pedal edema. Abdomen - Soft, non tender, bowel sounds good Neuro - Alert, awake and oriented x 3, non focal exam. Skin - Warm and dry.  Data Reviewed:      Latest Ref Rng & Units 06/12/2023    6:02 AM  06/11/2023    6:04 AM 06/10/2023    4:17 AM  CBC  WBC 4.0 - 10.5 K/uL 3.6  4.1  4.9   Hemoglobin 13.0 - 17.0 g/dL 8.1  7.8  8.1   Hematocrit 39.0 - 52.0 % 25.1  24.3  24.3   Platelets 150 - 400 K/uL 11  13  17        Latest Ref Rng & Units 06/12/2023    6:02 AM 06/11/2023    6:04 AM 06/10/2023    4:17 AM  BMP  Glucose 70 - 99 mg/dL 366  440  67   BUN 8 - 23 mg/dL 53  53  52   Creatinine 0.61 - 1.24 mg/dL 3.47  4.25  9.56   Sodium 135 - 145 mmol/L 142  141  139   Potassium 3.5 - 5.1 mmol/L 4.2  4.1  3.8   Chloride 98 - 111 mmol/L 110  110  110   CO2 22 - 32 mmol/L 23  24  20    Calcium 8.9 - 10.3 mg/dL 8.3  7.9  7.8    No results found.  Family Communication: Discussed with patient, understand and agree. He agreed with palliative care upon discharge.  Disposition: Status is: Inpatient Remains inpatient appropriate because: sepsis, SNF with palliative follow up.  Planned Discharge Destination: Skilled nursing facility awaiting auth per [facility]     Time spent .  Author: Aisha Hove, MD 06/12/2023 1:48 PM Secure chat 7am to 7pm For on call review [email or web address].

## 2023-06-13 ENCOUNTER — Ambulatory Visit: Admitting: Oncology

## 2023-06-13 ENCOUNTER — Encounter: Payer: Self-pay | Admitting: Pulmonary Disease

## 2023-06-13 ENCOUNTER — Other Ambulatory Visit

## 2023-06-13 ENCOUNTER — Inpatient Hospital Stay

## 2023-06-13 ENCOUNTER — Ambulatory Visit

## 2023-06-13 ENCOUNTER — Other Ambulatory Visit: Payer: Self-pay

## 2023-06-13 DIAGNOSIS — G928 Other toxic encephalopathy: Secondary | ICD-10-CM | POA: Diagnosis not present

## 2023-06-13 DIAGNOSIS — C9200 Acute myeloblastic leukemia, not having achieved remission: Secondary | ICD-10-CM | POA: Diagnosis not present

## 2023-06-13 DIAGNOSIS — R579 Shock, unspecified: Secondary | ICD-10-CM | POA: Diagnosis not present

## 2023-06-13 DIAGNOSIS — Z7189 Other specified counseling: Secondary | ICD-10-CM | POA: Diagnosis not present

## 2023-06-13 DIAGNOSIS — E876 Hypokalemia: Secondary | ICD-10-CM | POA: Diagnosis not present

## 2023-06-13 LAB — GLUCOSE, CAPILLARY
Glucose-Capillary: 124 mg/dL — ABNORMAL HIGH (ref 70–99)
Glucose-Capillary: 128 mg/dL — ABNORMAL HIGH (ref 70–99)
Glucose-Capillary: 146 mg/dL — ABNORMAL HIGH (ref 70–99)
Glucose-Capillary: 72 mg/dL (ref 70–99)
Glucose-Capillary: 83 mg/dL (ref 70–99)
Glucose-Capillary: 93 mg/dL (ref 70–99)

## 2023-06-13 MED ORDER — ENSURE ENLIVE PO LIQD
237.0000 mL | Freq: Two times a day (BID) | ORAL | Status: DC
  Administered 2023-06-14: 237 mL via ORAL

## 2023-06-13 NOTE — Plan of Care (Signed)
  Problem: Pain Managment: Goal: General experience of comfort will improve and/or be controlled Outcome: Progressing   Problem: Safety: Goal: Ability to remain free from injury will improve Outcome: Progressing   Problem: Coping: Goal: Ability to adjust to condition or change in health will improve Outcome: Progressing   Problem: Health Behavior/Discharge Planning: Goal: Ability to identify and utilize available resources and services will improve Outcome: Progressing
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
## 2023-06-13 NOTE — TOC Progression Note (Addendum)
 Transition of Care [facility]) - Progression Note    Patient Details  Name: Robert [name] MRN: [id number] Date of Birth: 01/24/1946  Transition of Care [facility]) CM/SW Contact  Crayton Docker, RN Phone Number: 06/13/2023, 3:54 PM  Clinical Narrative:     Alert received from Nate PT patient has declined therapy and requests to return to [facility]. Per Dr. Butch Cashing note, patient requests comfort care. Patient is DNR. Anette Barb call to [facility], spoke to Rexene Catching to cancel short term rehab auth due to patient request to return back to SNF under long term care. Auth cancelled. CM secure message to Antony Baumgartner, Admissions, [facility], regarding patient Siegfried Dress cancelled and patient request to return to SNF under comfort care. Per Antony Baumgartner, SNF will need referral to hospice. CM will update FL2.   CM updated FL2, alerted Dr. Butch Cashing for signature. CM faxed signed FL2 adding Hospice to SNF for review.   Alert received from RN Zelpha Hides regarding request for CM to call patient's wife. CM call to patient's wife, Charlotta Cook, phone: [phone] regarding pending discharge and return to SNF on tomorrow at 1130. Patient's wife, verbalized understanding and agreement. Expected Discharge Plan: Long Term Nursing Home Barriers to Discharge: Continued Medical Work up  Expected Discharge Plan and Services    Return to prior facility   Living arrangements for the past 2 months: Skilled Nursing Facility                   Social Determinants of Health (SDOH) Interventions SDOH Screenings   Food Insecurity: No Food Insecurity (06/07/2023)  Housing: Low Risk  (06/07/2023)  Transportation Needs: No Transportation Needs (06/07/2023)  Utilities: Not At Risk (06/07/2023)  Alcohol Screen: Low Risk  (10/29/2020)  Depression (PHQ2-9): Low Risk  (09/12/2021)  Financial Resource Strain: Low Risk  (12/15/2022)   Received from [facility]  Physical Activity: Inactive (10/29/2020)  Social Connections: Patient  Declined (06/07/2023)  Stress: No Stress Concern Present (10/29/2020)  Tobacco Use: Low Risk  (06/07/2023)    Readmission Risk Interventions    04/04/2023   12:56 PM 02/07/2023   12:33 PM  Readmission Risk Prevention Plan  Transportation Screening Complete Complete  Medication Review (RN Care Manager) Complete Complete  PCP or Specialist appointment within 3-5 days of discharge Complete Complete  SW Recovery Care/Counseling Consult Complete Complete  Palliative Care Screening Not Applicable Not Applicable  Skilled Nursing Facility Complete Complete

## 2023-06-13 NOTE — Progress Notes (Signed)
 PT Cancellation Note  Patient Details Name: CAMRON MONDAY MRN: [id number] DOB: Jun 27, 1945   Cancelled Treatment:    Reason Eval/Treat Not Completed: Medical issues which prohibited therapy (Per care team and notes, patient with transition to/focus on comfort care; planning to return to LTC. Will complete acute PT orders; please reconsult should needs change.)  Luisa Louk H. Bevin Bucks, PT, DPT, NCS 06/13/23, 9:32 PM [phone]

## 2023-06-13 NOTE — Progress Notes (Signed)
 PT Cancellation Note  Patient Details Name: Robert [name] MRN: [id number] DOB: 02-Sep-1945   Cancelled Treatment:     PT attempt. Pt was asleep in side lying. He easily awakes and is pleasant. Author discussed role of PT and pt requested not to have any further PT." Im wanting to go back to [facility] to live out my days."    Koleen Perna 06/13/2023, 2:09 PM

## 2023-06-13 NOTE — Progress Notes (Signed)
 Per inpatient notes from 4/22, patient transitioning to comfort care. Disenrolling.

## 2023-06-13 NOTE — Progress Notes (Signed)
 [facility]  ROUNDING NOTE   Subjective:   [name] was admitted to [facility] on 06/07/2023 for Dehydration [E86.0] Shock circulatory (HCC) [R57.9] Severe anemia [D64.9] Altered mental status, unspecified altered mental status type [R41.82] Acute myeloid leukemia not having achieved remission [facility]) [C92.00]  Patient was admitted to [facility] from 3/26 to 4/4 for acute kidney injury thought to be secondary to chemotherapy or antibiotics. He was discharged with a creatinine of 3.47 and GFR of 17.   Update:  Patient seen laying in bed Nursing at bedside Alert and oriented   Creatinine 2.72   Objective:  Vital signs in last 24 hours:  Temp:  [97.7 F (36.5 C)-98.5 F (36.9 C)] 97.7 F (36.5 C) (04/23 0732) Pulse Rate:  [77-79] 79 (04/23 0732) Resp:  [16-18] 16 (04/23 0732) BP: (127-160)/(75-103) 145/95 (04/23 0732) SpO2:  [99 %-100 %] 100 % (04/23 0732) Weight:  [97.5 kg] 97.5 kg (04/23 0732)  Weight change:  Filed Weights   06/10/23 0432 06/11/23 0500 06/13/23 0732  Weight: 98 kg 100.4 kg 97.5 kg    Intake/Output: I/O last 3 completed shifts: In: 1300 [P.O.:1200; IV Piggyback:100] Out: 2350 [Urine:2350]   Intake/Output this shift:  Total I/O In: 500 [P.O.:500] Out: -   Physical Exam: General: NAD, laying in bed  Head: Normocephalic, atraumatic. Moist oral mucosal membranes  Eyes: Anicteric  Lungs:  Clear to auscultation, normal effort  Heart: Regular rate and rhythm  Abdomen:  Soft, tenderness,   Extremities: No peripheral edema.  Neurologic: Alert and oriented, moving all four extremities  Skin: No lesions       Basic Metabolic Panel: Recent Labs  Lab 06/07/23 0748 06/07/23 1355 06/08/23 0714 06/09/23 0320 06/10/23 0417 06/11/23 0604 06/12/23 0602  NA 140   < > 139 140 139 141 142  K 3.8   < > 3.7 3.2* 3.8 4.1 4.2  CL 112*   < > 111 109 110 110 110  CO2 17*   < > 18* 21* 20* 24 23  GLUCOSE 104*   < > 139* 86 67* 102* 123*   BUN 58*   < > 57* 59* 52* 53* 53*  CREATININE 3.84*   < > 3.66* 3.36* 3.16* 2.99* 2.72*  CALCIUM 8.0*   < > 7.2* 7.6* 7.8* 7.9* 8.3*  MG 1.3*  --  1.7 1.7  --   --   --   PHOS 4.7*  --  6.0*  --   --   --   --    < > = values in this interval not displayed.    Liver Function Tests: Recent Labs  Lab 06/07/23 0748  AST 27  ALT 12  ALKPHOS 112  BILITOT 0.8  PROT 6.7  ALBUMIN  1.7*   No results for input(s): "LIPASE", "AMYLASE" in the last 168 hours. No results for input(s): "AMMONIA" in the last 168 hours.  CBC: Recent Labs  Lab 06/07/23 0748 06/07/23 1355 06/08/23 0031 06/08/23 0714 06/09/23 0320 06/10/23 0417 06/11/23 0604 06/12/23 0602  WBC 8.1 15.4*   < > 8.2 7.9 4.9 4.1 3.6*  NEUTROABS 5.7 12.0*  --   --   --   --   --   --   HGB 5.7* 6.3*   < > 8.1* 7.9* 8.1* 7.8* 8.1*  HCT 18.8* 19.7*   < > 23.8* 23.6* 24.3* 24.3* 25.1*  MCV 93.5 89.1   < > 85.6 85.5 87.7 88.0 87.2  PLT 21* 20*   < >  29* 20* 17* 13* 11*   < > = values in this interval not displayed.    Cardiac Enzymes: No results for input(s): "CKTOTAL", "CKMB", "CKMBINDEX", "TROPONINI" in the last 168 hours.   BNP: Invalid input(s): "POCBNP"  CBG: Recent Labs  Lab 06/12/23 2023 06/13/23 0000 06/13/23 0426 06/13/23 0733 06/13/23 1140  GLUCAP 83 128* 146* 72 93    Microbiology: Results for orders placed or performed during the hospital encounter of 06/07/23  Blood culture (routine x 2)     Status: None   Collection Time: 06/07/23  7:48 AM   Specimen: BLOOD  Result Value Ref Range Status   Specimen Description BLOOD BLOOD RIGHT ARM  Final   Special Requests   Final    BOTTLES DRAWN AEROBIC AND ANAEROBIC Blood Culture results may not be optimal due to an inadequate volume of blood received in culture bottles   Culture   Final    NO GROWTH 5 DAYS Performed at [facility], [address] [place], [place] [address]    Report Status 06/12/2023 FINAL  Final  Blood culture (routine x  2)     Status: None   Collection Time: 06/07/23  8:05 AM   Specimen: BLOOD  Result Value Ref Range Status   Specimen Description BLOOD BLOOD LEFT ARM  Final   Special Requests   Final    BOTTLES DRAWN AEROBIC AND ANAEROBIC Blood Culture results may not be optimal due to an inadequate volume of blood received in culture bottles   Culture   Final    NO GROWTH 5 DAYS Performed at [facility], [address], [place], [place] [address]    Report Status 06/12/2023 FINAL  Final  Urine Culture     Status: Abnormal   Collection Time: 06/07/23 10:06 AM   Specimen: Urine, Random  Result Value Ref Range Status   Specimen Description   Final    URINE, RANDOM Performed at [facility], [address], [place], [place] [address]    Special Requests   Final    NONE Reflexed from [phone] Performed at [facility], [address] [place], [place] [address]    Culture (A)  Final    >=100,000 COLONIES/mL KLEBSIELLA PNEUMONIAE Confirmed Extended Spectrum Beta-Lactamase Producer (ESBL).  In bloodstream infections from ESBL organisms, carbapenems are preferred over piperacillin/tazobactam. They are shown to have a lower risk of mortality.    Report Status 06/10/2023 FINAL  Final   Organism ID, Bacteria KLEBSIELLA PNEUMONIAE (A)  Final      Susceptibility   Klebsiella pneumoniae - MIC*    AMPICILLIN >=32 RESISTANT Resistant     CEFAZOLIN  >=64 RESISTANT Resistant     CEFTRIAXONE  32 RESISTANT Resistant     CIPROFLOXACIN <=0.25 SENSITIVE Sensitive     GENTAMICIN <=1 SENSITIVE Sensitive     IMIPENEM <=0.25 SENSITIVE Sensitive     NITROFURANTOIN 128 RESISTANT Resistant     TRIMETH /SULFA  >=320 RESISTANT Resistant     AMPICILLIN/SULBACTAM 4 SENSITIVE Sensitive     * >=100,000 COLONIES/mL KLEBSIELLA PNEUMONIAE  Resp panel by RT-PCR (RSV, Flu A&B, Covid) Anterior Nasal Swab     Status: None   Collection Time: 06/07/23 11:56 AM   Specimen: Anterior Nasal Swab  Result Value  Ref Range Status   SARS Coronavirus 2 by RT PCR NEGATIVE NEGATIVE Final    Comment: (NOTE) SARS-CoV-2 target nucleic acids are NOT DETECTED.  The SARS-CoV-2 RNA is generally detectable in upper respiratory specimens during the acute  phase of infection. The lowest concentration of SARS-CoV-2 viral copies this assay can detect is 138 copies/mL. A negative result does not preclude SARS-Cov-2 infection and should not be used as the sole basis for treatment or other patient management decisions. A negative result may occur with  improper specimen collection/handling, submission of specimen other than nasopharyngeal swab, presence of viral mutation(s) within the areas targeted by this assay, and inadequate number of viral copies(<138 copies/mL). A negative result must be combined with clinical observations, patient history, and epidemiological information. The expected result is Negative.  Fact Sheet for Patients:  BloggerCourse.com  Fact Sheet for Healthcare Providers:  SeriousBroker.it  This test is no t yet approved or cleared by the [place]  FDA and  has been authorized for detection and/or diagnosis of SARS-CoV-2 by FDA under an Emergency Use Authorization (EUA). This EUA will remain  in effect (meaning this test can be used) for the duration of the COVID-19 declaration under Section 564(b)(1) of the Act, 21 U.S.C.section 360bbb-3(b)(1), unless the authorization is terminated  or revoked sooner.       Influenza A by PCR NEGATIVE NEGATIVE Final   Influenza B by PCR NEGATIVE NEGATIVE Final    Comment: (NOTE) The Xpert Xpress SARS-CoV-2/FLU/RSV plus assay is intended as an aid in the diagnosis of influenza from Nasopharyngeal swab specimens and should not be used as a sole basis for treatment. Nasal washings and aspirates are unacceptable for Xpert Xpress SARS-CoV-2/FLU/RSV testing.  Fact Sheet for  Patients: BloggerCourse.com  Fact Sheet for Healthcare Providers: SeriousBroker.it  This test is not yet approved or cleared by the [place]  FDA and has been authorized for detection and/or diagnosis of SARS-CoV-2 by FDA under an Emergency Use Authorization (EUA). This EUA will remain in effect (meaning this test can be used) for the duration of the COVID-19 declaration under Section 564(b)(1) of the Act, 21 U.S.C. section 360bbb-3(b)(1), unless the authorization is terminated or revoked.     Resp Syncytial Virus by PCR NEGATIVE NEGATIVE Final    Comment: (NOTE) Fact Sheet for Patients: BloggerCourse.com  Fact Sheet for Healthcare Providers: SeriousBroker.it  This test is not yet approved or cleared by the [place]  FDA and has been authorized for detection and/or diagnosis of SARS-CoV-2 by FDA under an Emergency Use Authorization (EUA). This EUA will remain in effect (meaning this test can be used) for the duration of the COVID-19 declaration under Section 564(b)(1) of the Act, 21 U.S.C. section 360bbb-3(b)(1), unless the authorization is terminated or revoked.  Performed at [facility], [address] [place], [place] [address]     Coagulation Studies: No results for input(s): "LABPROT", "INR" in the last 72 hours.   Urinalysis: No results for input(s): "COLORURINE", "LABSPEC", "PHURINE", "GLUCOSEU", "HGBUR", "BILIRUBINUR", "KETONESUR", "PROTEINUR", "UROBILINOGEN", "NITRITE", "LEUKOCYTESUR" in the last 72 hours.  Invalid input(s): "APPERANCEUR"     Imaging: No results found.    Medications:      acyclovir   400 mg Oral BID   DULoxetine   60 mg Oral BID   ferrous sulfate   325 mg Oral BID WC   insulin  aspart  0-6 Units Subcutaneous Q4H   latanoprost   1 drop Both Eyes QHS   melatonin  2.5 mg Oral QHS   metoprolol  succinate  50 mg Oral Daily    mirtazapine   15 mg Oral QHS   oxybutynin   10 mg Oral QPM   pantoprazole   40 mg Oral Daily   senna-docusate  2 tablet Oral QHS   simvastatin   20 mg Oral Daily   acetaminophen , docusate sodium , ondansetron  (ZOFRAN ) IV, mouth rinse, polyethylene glycol  Assessment/ Plan:  [name] is a 78 y.o.  male with medical problems of acute myeloid leukoma, history of sigmoid colon cancer status post partial colectomy in November 2024, anemia, diabetes mellitus type II, peripheral arterial disease, chronic systolic congestive heart failure, history of pericardial effusion status post pericardial window. was admitted on 06/07/2023  for Dehydration [E86.0] Shock circulatory (HCC) [R57.9] Severe anemia [D64.9] Altered mental status, unspecified altered mental status type [R41.82] Acute myeloid leukemia not having achieved remission (HCC) [C92.00]   Acute Kidney Injury on chronic kidney disease stage 4 with history of proteinuria. with baseline creatinine 2.25 and GFR of 29 on 05/09/2023. CT in February 2025 showed severely atrophic left kidney. - Creatinine has shown improvements since admission.  - Will schedule follow up appt in our office at discharge.   Lab Results  Component Value Date   CREATININE 2.72 (H) 06/12/2023   CREATININE 2.99 (H) 06/11/2023   CREATININE 3.16 (H) 06/10/2023    Intake/Output Summary (Last 24 hours) at 06/13/2023 1156 Last data filed at 06/13/2023 1038 Gross per 24 hour  Intake 980 ml  Output 1300 ml  Net -320 ml   2. Anemia of chronic kidney disease with thrombocytopenia Lab Results  Component Value Date   HGB 8.1 (L) 06/12/2023    Patient with history of AML, pancytopenia and multiple blood transfusions in the past.  Will defer management to hematology.  3.  Chronic systolic heart failure : not in acute exacerbation. - holding furosemide  due to acute kidney injury. Volume stable  Do to renal recovery, we will sign off at this time   LOS:  6 Carmalita Wakefield 4/23/202511:56 AM

## 2023-06-13 NOTE — Plan of Care (Signed)

## 2023-06-13 NOTE — Progress Notes (Signed)
 Progress Note   Patient: Robert [name] ZOX:[id number] DOB: Oct 19, 1945 DOA: 06/07/2023     6 DOS: the patient was seen and examined on 06/13/2023   Brief hospital course: 78 y.o. male with PMHx significant for acute myeloblastic leukemia and iron  deficiency anemia, he is currently receiving antineoplastic chemotherapy (most recent treatment 04/16)  He was scheduled to receive 1 unit of pRBC's on 04/17 for hgb of 6.2.  However, he presented to the ER prior to receiving pRBC's due to altered mental status.  Admitted with Acute Metabolic Encephalopathy, Hypovolemic/Septic shock, and Severe Sepsis due to UTI.  Has received 4 units of pRBCs and 1 of platelets this admission.  Weaned off Levophed , hemodynamically stable transferred to TRH service.  Palliative team discussed with him - agreed with DNR. He would like to return to facility with comfort care, does not wish to continue chemotherapy. Awaiting auth for rehab.  Assessment and Plan: Acute toxic metabolic encephalopathy ~ improved Patient is back to baseline mental status. Avoid sedatives and hypnotics. Delirium precautions.   Shock: hypovolemic and septic shock ~ Resolved BP improved now, stop IV fluids Lactic acid has normalized Troponin negative. Resumed beta blocker as BP improved.   Acute kidney injury superimposed on CKD stage IIIa  Anion gap metabolic acidosis ~ improving Lactic acidosis  ~ resolved Hypomagnesia  Hypokalemia Hyperphosphatemia  Ensure adequate renal perfusion Avoid nephrotoxic agents. Replaced electrolytes as indicated Nephrology on board, appreciate input, his kidney function improving.   Sepsis in setting of UTI PMhx of ESBL UTI  Finished 7 days of empiric Meropenem .   Acute myeloid leukemia currently receiving antineoplastic chemotherapy not having achieved remission  Symptomatic anemia  Thrombocytopenia  Monitor for any bleeding  S/p 2 doses of Decitabine . Oncologist evaluation appreciated,  advised palliative care.  Goals of care discussion, patient agreed with DNR, orders placed. He wish to go back to [facility] with comfort care.  Type II diabetes mellitus  Continue accucheks sliding scale.    Out of bed to chair. Incentive spirometry. Nursing supportive care. Fall, aspiration precautions. Diet:  Diet Orders (From admission, onward)     Start     Ordered   06/08/23 0920  Diet heart healthy/carb modified Room service appropriate? Yes; Fluid consistency: Thin  Diet effective now       Question Answer Comment  Diet-HS Snack? Nothing   Room service appropriate? Yes   Fluid consistency: Thin      06/08/23 0919           DVT prophylaxis: SCDs Start: 06/07/23 1059  Level of care: Med-Surg   Code Status: Limited: Do not attempt resuscitation (DNR) -DNR-LIMITED -Do Not Intubate/DNI  Palliative team on board discussed with patient, wife agree with comfort measures upon discharge to facility.  Subjective: Patient is seen and examined today morning. He did not get out of bed. Did not work with PT, wishes to go back to [facility] with comfort measures only.    Physical Exam: Vitals:   06/13/23 0430 06/13/23 0501 06/13/23 0732 06/13/23 0732  BP: (!) 157/87 127/75  (!) 145/95  Pulse: 79 77  79  Resp: 18 17  16   Temp: 98.3 F (36.8 C) 97.8 F (36.6 C)  97.7 F (36.5 C)  TempSrc: Oral     SpO2: 99% 100%  100%  Weight:   97.5 kg   Height:        General - Elderly Caucasian male, lying in bed, no distress HEENT - PERRLA, EOMI, atraumatic  head, non tender sinuses. Lung - Clear, basal rales, no rhonchi, wheezes. Heart - S1, S2 heard, no murmurs, rubs, trace pedal edema. Abdomen - Soft, non tender, bowel sounds good Neuro - Alert, awake and oriented x 3, non focal exam. Skin - Warm and dry.  Data Reviewed:      Latest Ref Rng & Units 06/12/2023    6:02 AM 06/11/2023    6:04 AM 06/10/2023    4:17 AM  CBC  WBC 4.0 - 10.5 K/uL 3.6  4.1  4.9    Hemoglobin 13.0 - 17.0 g/dL 8.1  7.8  8.1   Hematocrit 39.0 - 52.0 % 25.1  24.3  24.3   Platelets 150 - 400 K/uL 11  13  17        Latest Ref Rng & Units 06/12/2023    6:02 AM 06/11/2023    6:04 AM 06/10/2023    4:17 AM  BMP  Glucose 70 - 99 mg/dL 161  096  67   BUN 8 - 23 mg/dL 53  53  52   Creatinine 0.61 - 1.24 mg/dL 0.45  4.09  8.11   Sodium 135 - 145 mmol/L 142  141  139   Potassium 3.5 - 5.1 mmol/L 4.2  4.1  3.8   Chloride 98 - 111 mmol/L 110  110  110   CO2 22 - 32 mmol/L 23  24  20    Calcium 8.9 - 10.3 mg/dL 8.3  7.9  7.8    No results found.  Family Communication: Discussed with patient, understand and agree. He agreed with palliative care upon discharge.  Disposition: Status is: Inpatient Remains inpatient appropriate because: sepsis, SNF with palliative follow up.  Planned Discharge Destination: Skilled nursing facility awaiting auth per [facility]     Time spent .  Author: Aisha Hove, MD 06/13/2023 1:44 PM Secure chat 7am to 7pm For on call review [email or web address].

## 2023-06-14 ENCOUNTER — Inpatient Hospital Stay

## 2023-06-14 DIAGNOSIS — D696 Thrombocytopenia, unspecified: Secondary | ICD-10-CM

## 2023-06-14 DIAGNOSIS — E876 Hypokalemia: Secondary | ICD-10-CM | POA: Diagnosis not present

## 2023-06-14 DIAGNOSIS — D61818 Other pancytopenia: Secondary | ICD-10-CM

## 2023-06-14 DIAGNOSIS — R579 Shock, unspecified: Secondary | ICD-10-CM | POA: Diagnosis not present

## 2023-06-14 DIAGNOSIS — C9200 Acute myeloblastic leukemia, not having achieved remission: Secondary | ICD-10-CM | POA: Diagnosis not present

## 2023-06-14 DIAGNOSIS — I5032 Chronic diastolic (congestive) heart failure: Secondary | ICD-10-CM

## 2023-06-14 DIAGNOSIS — R531 Weakness: Secondary | ICD-10-CM

## 2023-06-14 LAB — GLUCOSE, CAPILLARY
Glucose-Capillary: 141 mg/dL — ABNORMAL HIGH (ref 70–99)
Glucose-Capillary: 68 mg/dL — ABNORMAL LOW (ref 70–99)
Glucose-Capillary: 86 mg/dL (ref 70–99)
Glucose-Capillary: 99 mg/dL (ref 70–99)

## 2023-06-14 NOTE — TOC Transition Note (Addendum)
 Transition of Care [facility]) - Discharge Note   Patient Details  Name: Robert [name] MRN: [id number] Date of Birth: 1945-06-20  Transition of Care [facility]) CM/SW Contact:  Crayton Docker, RN Phone Number: 06/14/2023, 9:57 AM   Clinical Narrative:      Patient to discharge and return to [facility] via [facility] [place]. CM call to [employer], CM spoke to [place], regarding [employer] transport. CM provided RN report number to LPN [place]. Discharge orders, discharge summary noted, faxed with SNF transfer report to [facility].   Final next level of care: Skilled Nursing Facility Barriers to Discharge: No Barriers Identified   Patient Goals and CMS Choice    SNF    Discharge Placement     Return to prior facility            Discharge Plan and Services Additional resources added to the After Visit Summary for           Return to prior facility           Social Drivers of Health (SDOH) Interventions SDOH Screenings   Food Insecurity: No Food Insecurity (06/07/2023)  Housing: Low Risk  (06/07/2023)  Transportation Needs: No Transportation Needs (06/07/2023)  Utilities: Not At Risk (06/07/2023)  Alcohol Screen: Low Risk  (10/29/2020)  Depression (PHQ2-9): Low Risk  (09/12/2021)  Financial Resource Strain: Low Risk  (12/15/2022)   Received from [facility]  Physical Activity: Inactive (10/29/2020)  Social Connections: Patient Declined (06/07/2023)  Stress: No Stress Concern Present (10/29/2020)  Tobacco Use: Low Risk  (06/13/2023)     Readmission Risk Interventions    04/04/2023   12:56 PM 02/07/2023   12:33 PM  Readmission Risk Prevention Plan  Transportation Screening Complete Complete  Medication Review (RN Care Manager) Complete Complete  PCP or Specialist appointment within 3-5 days of discharge Complete Complete  SW Recovery Care/Counseling Consult Complete Complete  Palliative Care  Screening Not Applicable Not Applicable  Skilled Nursing Facility Complete Complete

## 2023-06-14 NOTE — Plan of Care (Signed)
  Problem: Pain Managment: Goal: General experience of comfort will improve and/or be controlled Outcome: Progressing   Problem: Safety: Goal: Ability to remain free from injury will improve Outcome: Progressing
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
## 2023-06-14 NOTE — Discharge Summary (Addendum)
 Physician Discharge Summary   Patient: Robert [name] MRN: [id number] DOB: [birth year]  Admit date:     06/07/2023  Discharge date: 06/14/23  Discharge Physician: Aisha Hove   PCP: Theron Flavin, MD   Recommendations at discharge:    Palliative care follow up at the facility.  Discharge Diagnoses: Principal Problem:   Shock circulatory (HCC) Active Problems:   Severe sepsis (HCC)   Acute kidney injury superimposed on CKD (HCC)   UTI (urinary tract infection)   Acute on chronic anemia   Hypokalemia   AML (acute myeloblastic leukemia) (HCC)   Chronic systolic CHF (congestive heart failure) (HCC)   Thrombocytopenia (HCC)   Generalized weakness   Hypomagnesemia   Pancytopenia (HCC)   (HFpEF) heart failure with preserved ejection fraction (HCC)   Goals of care, counseling/discussion  Resolved Problems:   * No resolved hospital problems. *  Hospital Course: Robert [name] is a 78 y.o. male with past medical history significant for hypertension hyperlipidemia, type 2 diabetes peripheral arterial disease who was diagnosed with acute myeloblastic leukemia diagnosed feb 2025, iron  deficiency anemia, he is currently receiving antineoplastic chemotherapy (most recent treatment 04/16)  He was scheduled to receive 1 unit of pRBC's on 04/17 for hgb of 6.2.  However, he presented to the ER prior to receiving pRBC's due to altered mental status.  Admitted to ICU with Acute Metabolic Encephalopathy, Hypovolemic/Septic shock, and Severe Sepsis due to UTI.  Has received 4 units of pRBCs and 1 of platelets this admission.  Weaned off Levophed , hemodynamically stable transferred to TRH service.  During the medical floor stay, palliative team discussed with him and he agreed with DNR. He would like to return to LTC facility with comfort care only. Oncology team made aware of his decision to stop chemotherapy. He is hemodynamically stable to be discharged back to [facility] with  palliative care follow up. Did mention that he would like to get out of bed if he is able to at the facility.  Assessment and Plan: Acute toxic metabolic encephalopathy ~ improved This could be due to hypotension upon presentation. Patient is back to baseline mental status. Avoid sedative medications. Continue delirium precautions.   Shock: hypovolemic and septic shock ~ Resolved He is weaned off pressors in ICU. S/p 4 units of pRBCs and 1 of platelets  BP improved now, Lactic acid has normalized Troponin negative. Resumed beta blocker as BP better.   Acute kidney injury superimposed on CKD stage IIIa  Anion gap metabolic acidosis ~ improving Lactic acidosis  ~ resolved Hypomagnesia  Hypokalemia Hyperphosphatemia  Avoid nephrotoxic agents. Replaced electrolytes as indicated Nephrology on board, appreciate input, his kidney function better. No need for dialysis.   Sepsis in setting of UTI PMhx of ESBL UTI  Finished 7 days of empiric Meropenem .   Acute myeloid leukemia currently receiving antineoplastic chemotherapy not having achieved remission  Symptomatic anemia  Thrombocytopenia  S/p 2 doses of Decitabine . Oncologist evaluation appreciated, advised palliative care. He did receive 4 units of pRBCs and 1 of platelets during this admission. Goals of care discussion, patient agreed with DNR. He wish to go back to [facility] with comfort care.   Type II diabetes mellitus  A1c 5.0, eating poor, no need for sliding scale or hypoglycemic agents.        Consultants: Palliative, nephrology, Oncology Procedures performed: none  Disposition: Skilled nursing facility Diet recommendation:  Discharge Diet Orders (From admission, onward)     Start     Ordered  06/14/23 0000  Diet - low sodium heart healthy        06/14/23 1027           Cardiac diet DISCHARGE MEDICATION: Allergies as of 06/14/2023   No Known Allergies      Medication List     STOP taking  these medications    acyclovir  400 MG tablet Commonly known as: ZOVIRAX    allopurinol  300 MG tablet Commonly known as: Zyloprim    simvastatin  20 MG tablet Commonly known as: ZOCOR        TAKE these medications    acetaminophen  650 MG CR tablet Commonly known as: TYLENOL  Take 650 mg by mouth every 6 (six) hours as needed for pain (Headache).   BD Pen Needle Nano 2nd Gen 32G X 4 MM Misc Generic drug: Insulin  Pen Needle USE 1 PEN NEEDLE EVERY DAY AS DIRECTED   DULoxetine  60 MG capsule Commonly known as: Cymbalta  Take 1 capsule (60 mg total) by mouth 2 (two) times daily.   FeroSul 325 (65 Fe) MG tablet Generic drug: ferrous sulfate  Take 1 tablet (325 mg total) by mouth 2 (two) times daily with a meal for 14 days.   FT Enema Saline 7-19 GM/118ML Enem Place 133 mLs (1 enema total) rectally daily as needed for severe constipation.   FT Stool Softener 50-8.6 MG tablet Generic drug: senna-docusate Take 2 tablets by mouth daily as needed for mild constipation.   hydrOXYzine  25 MG tablet Commonly known as: ATARAX  Take 1 tablet (25 mg total) by mouth 3 (three) times daily as needed for itching.   latanoprost  0.005 % ophthalmic solution Commonly known as: XALATAN  Place 1 drop into both eyes at bedtime.   melatonin 5 MG Tabs Take 1 tablet (5 mg total) by mouth at bedtime.   metoprolol  succinate 50 MG 24 hr tablet Commonly known as: TOPROL -XL Take 1 tablet (50 mg total) by mouth daily. Take with or immediately following a meal.   mirtazapine  15 MG tablet Commonly known as: REMERON  Take 1 tablet (15 mg total) by mouth daily.   omeprazole  20 MG capsule Commonly known as: PRILOSEC Take 20 mg by mouth 2 (two) times daily.   ondansetron  4 MG tablet Commonly known as: ZOFRAN  Take 1 tablet (4 mg total) by mouth every 6 (six) hours as needed for nausea.   ondansetron  8 MG tablet Commonly known as: Zofran  Take 1 tablet (8 mg total) by mouth every 8 (eight) hours as needed  for nausea or vomiting.   oxybutynin  10 MG 24 hr tablet Commonly known as: DITROPAN -XL Take 1 tablet (10 mg total) by mouth every evening.   polyethylene glycol powder 17 GM/SCOOP powder Commonly known as: GLYCOLAX /MIRALAX  Take 17 g by mouth daily.   prochlorperazine  10 MG tablet Commonly known as: COMPAZINE  Take 1 tablet (10 mg total) by mouth every 6 (six) hours as needed for nausea or vomiting.               Discharge Care Instructions  (From admission, onward)           Start     Ordered   06/14/23 0000  No dressing needed        06/14/23 1027            Contact information for follow-up providers     Theron Flavin, MD Follow up.   Specialties: Internal Medicine, Cardiology Why: Hospital follow up Contact information: [address] [place] [address] [phone]  Contact information for after-discharge care     Destination     [facility] Preferred SNF .   Service: Skilled Nursing Contact information: [address] [place]  [address] [phone]                    Discharge Exam: Robert [name] Weights   06/11/23 0500 06/13/23 0732 06/14/23 0434  Weight: 100.4 kg 97.5 kg 99.7 kg      06/14/2023    7:43 AM 06/14/2023    4:36 AM 06/14/2023    4:34 AM  Vitals with BMI  Weight   219 lbs 13 oz  BMI   28.21  Systolic 154 143   Diastolic 92 93   Pulse 81 78    General - Elderly Caucasian male, lying in bed, no apparent distress HEENT - PERRLA, EOMI, atraumatic head, non tender sinuses. Lung - Clear, basal rales, no rhonchi, wheezes. Heart - S1, S2 heard, no murmurs, rubs, 1+ pedal edema. Abdomen - Soft, non tender, bowel sounds good Neuro - Alert, awake and oriented, non focal exam. Skin - Warm and dry.  Condition at discharge: stable  The results of significant diagnostics from this hospitalization (including imaging,  microbiology, ancillary and laboratory) are listed below for reference.   Imaging Studies: DG Chest Port 1 View Result Date: 06/07/2023 CLINICAL DATA:  Weakness. EXAM: PORTABLE CHEST 1 VIEW COMPARISON:  04/04/2023. FINDINGS: Low lung volume. Mild diffuse pulmonary vascular congestion, likely accentuated by low lung volume, grossly similar to the prior study. There are bilateral nonspecific parenchymal opacities, which are grossly similar to the prior study and may represent underlying scarring/atelectasis. No acute consolidation or lung collapse. Bilateral lateral costophrenic angles are clear. Stable cardio-mediastinal silhouette. No acute osseous abnormalities. The soft tissues are within normal limits. IMPRESSION: *Mild diffuse pulmonary vascular congestion, likely accentuated by low lung volume. No acute consolidation or lung collapse. Electronically Signed   By: Beula Brunswick M.D.   On: 06/07/2023 08:47   US  RENAL Result Date: 05/16/2023 CLINICAL DATA:  Acute renal failure EXAM: RENAL / URINARY TRACT ULTRASOUND COMPLETE COMPARISON:  Renal CT 04/06/2023 FINDINGS: Right Kidney: Renal measurements: 13.0 x 6.9 x 7.4 cm = volume: 347 mL. Normal renal cortical thickness. Increased cortical echogenicity. No mass or hydronephrosis. Left Kidney: Renal measurements: 9.0 x 2.4 x 3.1 cm = volume: 36 mL. Renal cortical thinning. Increased cortical echogenicity. No mass or hydronephrosis. Bladder: Not well visualized. Other: None. IMPRESSION: 1. No hydronephrosis. 2. Increased cortical echogenicity as can be seen with chronic medical renal disease. 3. No definite mass identified within the right kidney to correspond with the mass described on recent CT 04/06/2023. Consider further evaluation with contrast-enhanced CT or MRI as clinically warranted. Electronically Signed   By: Jone Neither M.D.   On: 05/16/2023 14:29    Microbiology: Results for orders placed or performed during the hospital encounter of 06/07/23   Blood culture (routine x 2)     Status: None   Collection Time: 06/07/23  7:48 AM   Specimen: BLOOD  Result Value Ref Range Status   Specimen Description BLOOD BLOOD RIGHT ARM  Final   Special Requests   Final    BOTTLES DRAWN AEROBIC AND ANAEROBIC Blood Culture results may not be optimal due to an inadequate volume of blood received in culture bottles   Culture   Final    NO GROWTH 5 DAYS Performed at [facility], [address] [place], [place] [address]    Report Status 06/12/2023 FINAL  Final  Blood culture (routine x 2)     Status: None   Collection Time: 06/07/23  8:05 AM   Specimen: BLOOD  Result Value Ref Range Status   Specimen Description BLOOD BLOOD LEFT ARM  Final   Special Requests   Final    BOTTLES DRAWN AEROBIC AND ANAEROBIC Blood Culture results may not be optimal due to an inadequate volume of blood received in culture bottles   Culture   Final    NO GROWTH 5 DAYS Performed at [facility], [address], [place], [place] [address]    Report Status 06/12/2023 FINAL  Final  Urine Culture     Status: Abnormal   Collection Time: 06/07/23 10:06 AM   Specimen: Urine, Random  Result Value Ref Range Status   Specimen Description   Final    URINE, RANDOM Performed at [facility], [address], [place], [place] [address]    Special Requests   Final    NONE Reflexed from [id number] Performed at [facility], [address] [place], [place] [address]    Culture (A)  Final    >=100,000 COLONIES/mL KLEBSIELLA PNEUMONIAE Confirmed Extended Spectrum Beta-Lactamase Producer (ESBL).  In bloodstream infections from ESBL organisms, carbapenems are preferred over piperacillin/tazobactam. They are shown to have a lower risk of mortality.    Report Status 06/10/2023 FINAL  Final   Organism ID, Bacteria KLEBSIELLA PNEUMONIAE (A)  Final      Susceptibility   Klebsiella pneumoniae - MIC*    AMPICILLIN >=32 RESISTANT Resistant      CEFAZOLIN  >=64 RESISTANT Resistant     CEFTRIAXONE  32 RESISTANT Resistant     CIPROFLOXACIN <=0.25 SENSITIVE Sensitive     GENTAMICIN <=1 SENSITIVE Sensitive     IMIPENEM <=0.25 SENSITIVE Sensitive     NITROFURANTOIN 128 RESISTANT Resistant     TRIMETH /SULFA  >=320 RESISTANT Resistant     AMPICILLIN/SULBACTAM 4 SENSITIVE Sensitive     * >=100,000 COLONIES/mL KLEBSIELLA PNEUMONIAE  Resp panel by RT-PCR (RSV, Flu A&B, Covid) Anterior Nasal Swab     Status: None   Collection Time: 06/07/23 11:56 AM   Specimen: Anterior Nasal Swab  Result Value Ref Range Status   SARS Coronavirus 2 by RT PCR NEGATIVE NEGATIVE Final    Comment: (NOTE) SARS-CoV-2 target nucleic acids are NOT DETECTED.  The SARS-CoV-2 RNA is generally detectable in upper respiratory specimens during the acute phase of infection. The lowest concentration of SARS-CoV-2 viral copies this assay can detect is 138 copies/mL. A negative result does not preclude SARS-Cov-2 infection and should not be used as the sole basis for treatment or other patient management decisions. A negative result may occur with  improper specimen collection/handling, submission of specimen other than nasopharyngeal swab, presence of viral mutation(s) within the areas targeted by this assay, and inadequate number of viral copies(<138 copies/mL). A negative result must be combined with clinical observations, patient history, and epidemiological information. The expected result is Negative.  Fact Sheet for Patients:  BloggerCourse.com  Fact Sheet for Healthcare Providers:  SeriousBroker.it  This test is no t yet approved or cleared by the [place]  FDA and  has been authorized for detection and/or diagnosis of SARS-CoV-2 by FDA under an Emergency Use Authorization (EUA). This EUA will remain  in effect (meaning this test can be used) for the duration of the COVID-19 declaration under Section  564(b)(1) of the Act, 21  U.S.C.section 360bbb-3(b)(1), unless the authorization is terminated  or revoked sooner.       Influenza A by PCR NEGATIVE NEGATIVE Final   Influenza B by PCR NEGATIVE NEGATIVE Final    Comment: (NOTE) The Xpert Xpress SARS-CoV-2/FLU/RSV plus assay is intended as an aid in the diagnosis of influenza from Nasopharyngeal swab specimens and should not be used as a sole basis for treatment. Nasal washings and aspirates are unacceptable for Xpert Xpress SARS-CoV-2/FLU/RSV testing.  Fact Sheet for Patients: BloggerCourse.com  Fact Sheet for Healthcare Providers: SeriousBroker.it  This test is not yet approved or cleared by the [place]  FDA and has been authorized for detection and/or diagnosis of SARS-CoV-2 by FDA under an Emergency Use Authorization (EUA). This EUA will remain in effect (meaning this test can be used) for the duration of the COVID-19 declaration under Section 564(b)(1) of the Act, 21 U.S.C. section 360bbb-3(b)(1), unless the authorization is terminated or revoked.     Resp Syncytial Virus by PCR NEGATIVE NEGATIVE Final    Comment: (NOTE) Fact Sheet for Patients: BloggerCourse.com  Fact Sheet for Healthcare Providers: SeriousBroker.it  This test is not yet approved or cleared by the [place]  FDA and has been authorized for detection and/or diagnosis of SARS-CoV-2 by FDA under an Emergency Use Authorization (EUA). This EUA will remain in effect (meaning this test can be used) for the duration of the COVID-19 declaration under Section 564(b)(1) of the Act, 21 U.S.C. section 360bbb-3(b)(1), unless the authorization is terminated or revoked.  Performed at [facility], [address] [place], [place] [address]     Labs: CBC: Recent Labs  Lab 06/07/23 1355 06/08/23 0031 06/08/23 6045 06/09/23 0320 06/10/23 0417  06/11/23 0604 06/12/23 0602  WBC 15.4*   < > 8.2 7.9 4.9 4.1 3.6*  NEUTROABS 12.0*  --   --   --   --   --   --   HGB 6.3*   < > 8.1* 7.9* 8.1* 7.8* 8.1*  HCT 19.7*   < > 23.8* 23.6* 24.3* 24.3* 25.1*  MCV 89.1   < > 85.6 85.5 87.7 88.0 87.2  PLT 20*   < > 29* 20* 17* 13* 11*   < > = values in this interval not displayed.   Basic Metabolic Panel: Recent Labs  Lab 06/08/23 0714 06/09/23 0320 06/10/23 0417 06/11/23 0604 06/12/23 0602  NA 139 140 139 141 142  K 3.7 3.2* 3.8 4.1 4.2  CL 111 109 110 110 110  CO2 18* 21* 20* 24 23  GLUCOSE 139* 86 67* 102* 123*  BUN 57* 59* 52* 53* 53*  CREATININE 3.66* 3.36* 3.16* 2.99* 2.72*  CALCIUM 7.2* 7.6* 7.8* 7.9* 8.3*  MG 1.7 1.7  --   --   --   PHOS 6.0*  --   --   --   --    Liver Function Tests: No results for input(s): "AST", "ALT", "ALKPHOS", "BILITOT", "PROT", "ALBUMIN " in the last 168 hours. CBG: Recent Labs  Lab 06/13/23 2050 06/14/23 0050 06/14/23 0438 06/14/23 0501 06/14/23 0822  GLUCAP 124* 99 68* 86 141*    Discharge time spent: 36 minutes.  Signed: Aisha Hove, MD [facility] 06/14/2023

## 2023-06-14 NOTE — Progress Notes (Signed)
 Pt report called to [facility] for Pt all questions answered. Rn ERA

## 2023-06-20 ENCOUNTER — Ambulatory Visit: Admitting: Oncology

## 2023-06-20 ENCOUNTER — Ambulatory Visit

## 2023-06-20 ENCOUNTER — Other Ambulatory Visit

## 2023-06-29 ENCOUNTER — Other Ambulatory Visit: Payer: Self-pay

## 2023-06-29 ENCOUNTER — Emergency Department
Admission: EM | Admit: 2023-06-29 | Discharge: 2023-06-30 | Disposition: A | Discharge status: Home / Self Care | Attending: Emergency Medicine | Admitting: Emergency Medicine

## 2023-06-29 DIAGNOSIS — D696 Thrombocytopenia, unspecified: Secondary | ICD-10-CM | POA: Diagnosis not present

## 2023-06-29 DIAGNOSIS — D72819 Decreased white blood cell count, unspecified: Secondary | ICD-10-CM | POA: Insufficient documentation

## 2023-06-29 DIAGNOSIS — D649 Anemia, unspecified: Secondary | ICD-10-CM | POA: Diagnosis present

## 2023-06-29 DIAGNOSIS — Z66 Do not resuscitate: Secondary | ICD-10-CM | POA: Diagnosis not present

## 2023-06-29 LAB — COMPREHENSIVE METABOLIC PANEL WITH GFR
ALT: 8 U/L (ref 0–44)
AST: 17 U/L (ref 15–41)
Albumin: 2.2 g/dL — ABNORMAL LOW (ref 3.5–5.0)
Alkaline Phosphatase: 81 U/L (ref 38–126)
Anion gap: 8 (ref 5–15)
BUN: 35 mg/dL — ABNORMAL HIGH (ref 8–23)
CO2: 19 mmol/L — ABNORMAL LOW (ref 22–32)
Calcium: 8 mg/dL — ABNORMAL LOW (ref 8.9–10.3)
Chloride: 111 mmol/L (ref 98–111)
Creatinine, Ser: 2.71 mg/dL — ABNORMAL HIGH (ref 0.61–1.24)
GFR, Estimated: 23 mL/min — ABNORMAL LOW (ref >60)
Glucose, Bld: 139 mg/dL — ABNORMAL HIGH (ref 70–99)
Potassium: 4 mmol/L (ref 3.5–5.1)
Sodium: 138 mmol/L (ref 135–145)
Total Bilirubin: 0.6 mg/dL (ref 0.0–1.2)
Total Protein: 7.1 g/dL (ref 6.5–8.1)

## 2023-06-29 LAB — CBC WITH DIFFERENTIAL/PLATELET
Abs Immature Granulocytes: 0 10*3/uL (ref 0.00–0.07)
Basophils Absolute: 0 10*3/uL (ref 0.0–0.1)
Basophils Relative: 0 %
Eosinophils Absolute: 0 10*3/uL (ref 0.0–0.5)
Eosinophils Relative: 0 %
HCT: 17.7 % — ABNORMAL LOW (ref 39.0–52.0)
Hemoglobin: 5.3 g/dL — ABNORMAL LOW (ref 13.0–17.0)
Lymphocytes Relative: 82 %
Lymphs Abs: 2.4 10*3/uL (ref 0.7–4.0)
MCH: 28.8 pg (ref 26.0–34.0)
MCHC: 29.9 g/dL — ABNORMAL LOW (ref 30.0–36.0)
MCV: 96.2 fL (ref 80.0–100.0)
Monocytes Absolute: 0.2 10*3/uL (ref 0.1–1.0)
Monocytes Relative: 8 %
Neutro Abs: 0.3 10*3/uL — CL (ref 1.7–7.7)
Neutrophils Relative %: 10 %
Platelets: 41 10*3/uL — ABNORMAL LOW (ref 150–400)
RBC: 1.84 MIL/uL — ABNORMAL LOW (ref 4.22–5.81)
RDW: 19.7 % — ABNORMAL HIGH (ref 11.5–15.5)
WBC: 2.9 10*3/uL — ABNORMAL LOW (ref 4.0–10.5)
nRBC: 0.7 % — ABNORMAL HIGH (ref 0.0–0.2)

## 2023-06-29 LAB — PREPARE RBC (CROSSMATCH)

## 2023-06-29 LAB — RETICULOCYTES
Immature Retic Fract: 7.6 % (ref 2.3–15.9)
RBC.: 1.79 MIL/uL — ABNORMAL LOW (ref 4.22–5.81)
Retic Count, Absolute: 200.4 10*3/uL — ABNORMAL HIGH (ref 19.0–186.0)
Retic Ct Pct: 11.1 % — ABNORMAL HIGH (ref 0.4–3.1)

## 2023-06-29 LAB — LACTATE DEHYDROGENASE: LDH: 197 U/L — ABNORMAL HIGH (ref 98–192)

## 2023-06-29 LAB — FERRITIN: Ferritin: 1388 ng/mL — ABNORMAL HIGH (ref 24–336)

## 2023-06-29 MED ORDER — SODIUM CHLORIDE 0.9 % IV SOLN: 10.0000 mL/h | Freq: Once | INTRAVENOUS | Status: DC

## 2023-06-29 MED ORDER — SODIUM CHLORIDE 0.9 % IV SOLN
10.0000 mL/h | Freq: Once | INTRAVENOUS | Status: AC
  Administered 2023-06-29: 10 mL/h via INTRAVENOUS

## 2023-06-29 NOTE — ED Notes (Signed)
 First Nurse Note: Pt to ED via [facility] from [facility]. Pt was having routine blood work and his Hgb was 4.8. Pt routinely gets blood transfusions. Pt does not have any complaints. VSS with EMS. Pt has DNR.

## 2023-06-29 NOTE — ED Notes (Signed)
 Called Finnigan,[name] C (patients spouse) and gave her an update at this time.

## 2023-06-29 NOTE — Progress Notes (Addendum)
  Declined admission  Reason for request for admission: Hemoglobin 4.8 on routine check, need for transfusion in comfort care , asymptomatic patient Requesting physician: Dr. Luane Rumps Skiver is a 78 y.o. male with past medical history significant for hypertension hyperlipidemia, type 2 diabetes peripheral arterial disease who was diagnosed with acute myeloblastic leukemia diagnosed feb 2025, recently hospitalized from 4/17 to 4/24 with circulatory shock secondary to UTI, with need for transfusion of 4 units PRBCs and 1 unit platelets during his stay, discharged to comfort care following palliative care consult in which goals of care was discussed, who was sent to the ED by his facility due to a routine hemoglobin of 4.8.  Patient was asymptomatic.  Admission was requested for blood transfusion. ED provider had initially spoken with on-call hematologist Dr. Aris Bel who agreed to see patient in the a.m. Following my review of records, given that patient is asymptomatic and is currently on comfort care, I discussed the possibility of transfusion in the emergency room with discharge back to his facility.  I spoke with both Dr. Hendrick Locke via secure chat and subsequently Dr. Aris Bel.  Patient needed irradiated blood.  I spoke with blood bank who had 3 irradiated units available. Dr. Aris Bel agreed via secure chat that it was okay to transfuse patient with irradiated PRBCs - two units  and discharge back to rehab given that patient is comfort care, hemoglobin check was routine, and patient is asymptomatic.  Discussed with Dr. Hendrick Locke who is also agreeable. Admission subsequently declined as no clinical need for admission beyond blood transfusion given goals of care.       06/29/2023    9:37 PM 06/29/2023    9:22 PM 06/29/2023    9:14 PM  Vitals with BMI  Systolic 141 121 161  Diastolic 83 63 63  Pulse 69 67 67      Latest Ref Rng & Units 06/29/2023    4:58 PM 06/12/2023    6:02 AM 06/11/2023     6:04 AM  CBC  WBC 4.0 - 10.5 K/uL 2.9  3.6  4.1   Hemoglobin 13.0 - 17.0 g/dL 5.3  8.1  7.8   Hematocrit 39.0 - 52.0 % 17.7  25.1  24.3   Platelets 150 - 400 K/uL 41  11  13    Assessment: Anemia of 5.3 in patient with AML, on comfort care, hemodynamically stable and asymptomatic  Plan: Transfuse 2 units irradiated blood(per hematology) and  discharge back to facility, assuming no acute change in medical condition warranting inpatient stay.   Data Reviewed: Relevant notes from primary care and specialist visits, past discharge summaries as available in EHR, including Care Everywhere. Prior diagnostic testing as pertinent to current admission diagnoses Updated medications and problem lists for reconciliation ED course, including vitals, labs, imaging, treatment and response to treatment Triage notes, nursing and pharmacy notes and ED provider's notes Notable results . .vcit

## 2023-06-29 NOTE — ED Provider Notes (Signed)
-----------------------------------------   11:40 PM on 06/29/2023 -----------------------------------------  Assuming care from Dr. Hendrick Locke.  In short, Robert [name] is a 78 y.o. male with a chief complaint of anemia.  Refer to the original H&P for additional details.  The current plan of care is to be discharged after blood transfuions.   Clinical Course as of 06/30/23 0306  Sat Jun 30, 2023  0304 Blood transfusion is complete.  I reassessed the patient.  He was resting but awakens easily, spoke with me, confirmed he is ready to go home and has no acute concerns right now.  I gave my usual follow-up recommendations. [CF]    Clinical Course User Index [CF] Lynnda Sas, MD     Medications  0.9 %  sodium chloride  infusion (0 mL/hr Intravenous Stopped 06/29/23 2137)     ED Discharge Orders     None      Final diagnoses:  Anemia, unspecified type  DNR (do not resuscitate)     Lynnda Sas, MD 06/30/23 [phone]

## 2023-06-29 NOTE — ED Triage Notes (Signed)
 Pt from [facility] via EMS for abnormal bloodwork. Pt denies complaints at this time, says he has been laying in bed more lately. Paperwork shows HGB 4.8 at 2:44 pm today. Pt AOX4, NAD noted. Pt appears pale.

## 2023-06-29 NOTE — ED Provider Notes (Signed)
 [facility] Provider Note    Event Date/Time   First MD Initiated Contact with Patient 06/29/23 1819     (approximate)   History   abnormal labs   HPI  Robert [name] is a 78 y.o. male who presents to the emergency department today because of concerns for low hemoglobin found on blood work.  Patient states he has been feeling good recently.  He says that the blood work was checked as part of a routine check given his history of anemia.  He denies any bloody or black or tarry stool.  Per chart review he has seen hematology oncology for this anemia in the past.     Physical Exam   Triage Vital Signs: ED Triage Vitals  Encounter Vitals Group     BP 06/29/23 1652 131/75     Systolic BP Percentile --      Diastolic BP Percentile --      Pulse Rate 06/29/23 1652 72     Resp 06/29/23 1652 17     Temp 06/29/23 1652 98.1 F (36.7 C)     Temp Source 06/29/23 1652 Oral     SpO2 06/29/23 1652 100 %     Weight 06/29/23 1655 220 lb (99.8 kg)     Height 06/29/23 1655 6\' 2"  (1.88 m)     Head Circumference --      Peak Flow --      Pain Score 06/29/23 1654 0     Pain Loc --      Pain Education --      Exclude from Growth Chart --     Most recent vital signs: Vitals:   06/29/23 1652  BP: 131/75  Pulse: 72  Resp: 17  Temp: 98.1 F (36.7 C)  SpO2: 100%   General: Awake, alert, oriented. CV:  Good peripheral perfusion. Regular rate and rhythm. Resp:  Normal effort. Lungs clear. Abd:  No distention.    ED Results / Procedures / Treatments   Labs (all labs ordered are listed, but only abnormal results are displayed) Labs Reviewed  CBC WITH DIFFERENTIAL/PLATELET - Abnormal; Notable for the following components:      Result Value   WBC 2.9 (*)    RBC 1.84 (*)    Hemoglobin 5.3 (*)    HCT 17.7 (*)    MCHC 29.9 (*)    RDW 19.7 (*)    Platelets 41 (*)    nRBC 0.7 (*)    Neutro Abs 0.3 (*)    All other components within normal limits   COMPREHENSIVE METABOLIC PANEL WITH GFR - Abnormal; Notable for the following components:   CO2 19 (*)    Glucose, Bld 139 (*)    BUN 35 (*)    Creatinine, Ser 2.71 (*)    Calcium 8.0 (*)    Albumin  2.2 (*)    GFR, Estimated 23 (*)    All other components within normal limits  TYPE AND SCREEN     EKG  None   RADIOLOGY None   PROCEDURES:  Critical Care performed: Yes  CRITICAL CARE Performed by: Marylynn Soho   Total critical care time: 35 minutes  Critical care time was exclusive of separately billable procedures and treating other patients.  Critical care was necessary to treat or prevent imminent or life-threatening deterioration.  Critical care was time spent personally by me on the following activities: development of treatment plan with patient and/or surrogate as well as nursing, discussions with  consultants, evaluation of patient's response to treatment, examination of patient, obtaining history from patient or surrogate, ordering and performing treatments and interventions, ordering and review of laboratory studies, ordering and review of radiographic studies, pulse oximetry and re-evaluation of patient's condition.   Procedures    MEDICATIONS ORDERED IN ED: Medications - No data to display   IMPRESSION / MDM / ASSESSMENT AND PLAN / ED COURSE  I reviewed the triage vital signs and the nursing notes.                              Differential diagnosis includes, but is not limited to, anemia of chronic disease, iron  deficiency, blood loss  Patient's presentation is most consistent with acute presentation with potential threat to life or bodily function.   The patient is on the cardiac monitor to evaluate for evidence of arrhythmia and/or significant heart rate changes.  Patient presented to the emergency department today because of concerns for blood work showing anemia.  Patient states he feels good.  Blood work here does show anemia.  It also shows  thrombocytopenia and leukopenia.  I did discuss with Dr. Vallarie Gauze with the hospitalist service about possible admission.  However after discussion with Dr. Vallarie Gauze and Dr. Aris Bel with heme/onc and in review of recent admission and documentation decision was made to transfuse in the emergency department two units and then discharge back to facility.    FINAL CLINICAL IMPRESSION(S) / ED DIAGNOSES   Final diagnoses:  Anemia, unspecified type        Rx / DC Orders     Note:  This document was prepared using Dragon voice recognition software and may include unintentional dictation errors.    Marylynn Soho, MD 06/29/23 [phone]

## 2023-07-01 ENCOUNTER — Other Ambulatory Visit: Payer: Self-pay

## 2023-07-01 LAB — TYPE AND SCREEN
ABO/RH(D): AB POS
Antibody Screen: NEGATIVE
Unit division: 0
Unit division: 0

## 2023-07-01 LAB — BPAM RBC
Blood Product Expiration Date: 202505202359
ISSUE DATE / TIME: 202505092118
ISSUE DATE / TIME: 202505100040
ISSUE DATE / TIME: 202505182359
Unit Type and Rh: 202505182359
Unit Type and Rh: 202505202359
Unit Type and Rh: 9500
Unit Type and Rh: 9500

## 2023-07-01 LAB — HAPTOGLOBIN: Haptoglobin: 179 mg/dL (ref 34–355)

## 2023-07-04 ENCOUNTER — Telehealth: Payer: Self-pay | Admitting: *Deleted

## 2023-07-04 NOTE — Telephone Encounter (Signed)
 Per v/o Lilian Register, NP - I called [facility] and left a message for the NP, Adassa Cox, NP tocall [name] directly to discuss pt's care. PT had a ER visit on 5/9 - rcvd 4 units of blood and 1 unit of plts during stay. Per ER discussion, pt would be d/c back to facility and anticipate comfort care/and palliative care at facility. Patient has no f/u in the cancer center at this time. I reached out to the facility to see if patient had palliative care consult yet or if he is currently on hospice/comfort care. Per facility nurse, patient had a consult referral placed on 4/24 for palliative care, but has not been formally been evaluated by [facility] hospice or palliative care.

## 2023-07-05 NOTE — Telephone Encounter (Signed)
 Case discussed with facility NP.  I also called and spoke with patient's wife.  She states the patient is doing well posttransfusion and both patient and she are unwilling to forego future transfusions at this point.  She is not interested in hospice at this time.  She does verbalize understanding that patient is unlikely to be a candidate for further chemotherapy and will expectedly decline.  Discussed with Dr. Randy Buttery and will schedule patient for labs/possible transfusion next week.

## 2023-07-06 ENCOUNTER — Other Ambulatory Visit: Payer: Self-pay | Admitting: Oncology

## 2023-07-06 ENCOUNTER — Encounter: Payer: Self-pay | Admitting: Oncology

## 2023-07-06 ENCOUNTER — Other Ambulatory Visit: Payer: Self-pay

## 2023-07-09 ENCOUNTER — Inpatient Hospital Stay: Attending: Oncology

## 2023-07-09 ENCOUNTER — Encounter: Payer: Self-pay | Admitting: Hospice and Palliative Medicine

## 2023-07-09 ENCOUNTER — Other Ambulatory Visit: Payer: Self-pay | Admitting: *Deleted

## 2023-07-09 ENCOUNTER — Inpatient Hospital Stay

## 2023-07-09 ENCOUNTER — Inpatient Hospital Stay (HOSPITAL_BASED_OUTPATIENT_CLINIC_OR_DEPARTMENT_OTHER): Admitting: Hospice and Palliative Medicine

## 2023-07-09 VITALS — BP 157/93 | HR 72 | Temp 98.3°F | Resp 19

## 2023-07-09 DIAGNOSIS — E1151 Type 2 diabetes mellitus with diabetic peripheral angiopathy without gangrene: Secondary | ICD-10-CM | POA: Insufficient documentation

## 2023-07-09 DIAGNOSIS — C9200 Acute myeloblastic leukemia, not having achieved remission: Secondary | ICD-10-CM

## 2023-07-09 DIAGNOSIS — Z66 Do not resuscitate: Secondary | ICD-10-CM | POA: Insufficient documentation

## 2023-07-09 DIAGNOSIS — C187 Malignant neoplasm of sigmoid colon: Secondary | ICD-10-CM | POA: Insufficient documentation

## 2023-07-09 DIAGNOSIS — Z79899 Other long term (current) drug therapy: Secondary | ICD-10-CM | POA: Diagnosis not present

## 2023-07-09 DIAGNOSIS — Z515 Encounter for palliative care: Secondary | ICD-10-CM | POA: Insufficient documentation

## 2023-07-09 DIAGNOSIS — D649 Anemia, unspecified: Secondary | ICD-10-CM

## 2023-07-09 DIAGNOSIS — E785 Hyperlipidemia, unspecified: Secondary | ICD-10-CM | POA: Diagnosis not present

## 2023-07-09 DIAGNOSIS — N179 Acute kidney failure, unspecified: Secondary | ICD-10-CM

## 2023-07-09 DIAGNOSIS — I10 Essential (primary) hypertension: Secondary | ICD-10-CM | POA: Insufficient documentation

## 2023-07-09 LAB — CBC WITH DIFFERENTIAL (CANCER CENTER ONLY)
Abs Immature Granulocytes: 0 10*3/uL (ref 0.00–0.07)
Basophils Absolute: 0 10*3/uL (ref 0.0–0.1)
Basophils Relative: 0 %
Eosinophils Absolute: 0 10*3/uL (ref 0.0–0.5)
Eosinophils Relative: 0 %
HCT: 21.7 % — ABNORMAL LOW (ref 39.0–52.0)
Hemoglobin: 6.7 g/dL — CL (ref 13.0–17.0)
Immature Granulocytes: 0 %
Lymphocytes Relative: 79 %
Lymphs Abs: 2.1 10*3/uL (ref 0.7–4.0)
MCH: 29.5 pg (ref 26.0–34.0)
MCHC: 30.9 g/dL (ref 30.0–36.0)
MCV: 95.6 fL (ref 80.0–100.0)
Monocytes Absolute: 0.2 10*3/uL (ref 0.1–1.0)
Monocytes Relative: 6 %
Neutro Abs: 0.4 10*3/uL — CL (ref 1.7–7.7)
Neutrophils Relative %: 15 %
Platelet Count: 42 10*3/uL — ABNORMAL LOW (ref 150–400)
RBC: 2.27 MIL/uL — ABNORMAL LOW (ref 4.22–5.81)
RDW: 20.4 % — ABNORMAL HIGH (ref 11.5–15.5)
Smear Review: NORMAL
WBC Count: 2.6 10*3/uL — ABNORMAL LOW (ref 4.0–10.5)
nRBC: 0 % (ref 0.0–0.2)

## 2023-07-09 LAB — CMP (CANCER CENTER ONLY)
ALT: 7 U/L (ref 0–44)
AST: 15 U/L (ref 15–41)
Albumin: 2.2 g/dL — ABNORMAL LOW (ref 3.5–5.0)
Alkaline Phosphatase: 80 U/L (ref 38–126)
Anion gap: 8 (ref 5–15)
BUN: 24 mg/dL — ABNORMAL HIGH (ref 8–23)
CO2: 24 mmol/L (ref 22–32)
Calcium: 8.1 mg/dL — ABNORMAL LOW (ref 8.9–10.3)
Chloride: 105 mmol/L (ref 98–111)
Creatinine: 2.1 mg/dL — ABNORMAL HIGH (ref 0.61–1.24)
GFR, Estimated: 32 mL/min — ABNORMAL LOW (ref >60)
Glucose, Bld: 114 mg/dL — ABNORMAL HIGH (ref 70–99)
Potassium: 3.7 mmol/L (ref 3.5–5.1)
Sodium: 137 mmol/L (ref 135–145)
Total Bilirubin: 0.7 mg/dL (ref 0.0–1.2)
Total Protein: 7.2 g/dL (ref 6.5–8.1)

## 2023-07-09 LAB — PREPARE RBC (CROSSMATCH)

## 2023-07-09 NOTE — Progress Notes (Signed)
 Palliative Medicine [facility] Telephone:([phone] [phone]   Name: Robert [name] Date: 07/09/2023 MRN: [id number]  DOB: 12/25/1945  Patient Care Team: Theron Flavin, MD as PCP - General (Internal Medicine) Avonne Boettcher, MD as Consulting Physician (Oncology)    REASON FOR CONSULTATION: Robert [name] is a 78 y.o. male with multiple medical problems including AML not currently in remission.  Patient has been hospitalized after each trial of decitabine .  Further chemotherapy was not recommended.  Hospice and comfort care were discussed but patient opted to continue transfusions.  Palliative care was consulted to address goals and manage ongoing symptoms.  SOCIAL HISTORY:     reports that he has never smoked. He has never used smokeless tobacco. He reports that he does not currently use alcohol. He reports that he does not use drugs.  Patient married.  He is a resident at [facility] [employer].  ADVANCE DIRECTIVES:    CODE STATUS: DNR  PAST MEDICAL HISTORY: Past Medical History:  Diagnosis Date   Allergy    Cellulitis    Diabetes mellitus without complication (HCC)    Hyperlipidemia    Hypertension    Leukemia (HCC)    Peripheral vascular disease (HCC)     PAST SURGICAL HISTORY:  Past Surgical History:  Procedure Laterality Date   BIOPSY  08/21/2022   Procedure: BIOPSY;  Surgeon: Quintin Buckle, DO;  Location: [facility] ENDOSCOPY;  Service: Gastroenterology;;   COLONOSCOPY WITH PROPOFOL  N/A 10/02/2017   Procedure: COLONOSCOPY WITH PROPOFOL ;  Surgeon: Luke Salaam, MD;  Location: [facility] ENDOSCOPY;  Service: Gastroenterology;  Laterality: N/A;   COLONOSCOPY WITH PROPOFOL  N/A 08/21/2022   Procedure: COLONOSCOPY WITH PROPOFOL ;  Surgeon: Quintin Buckle, DO;  Location: [facility] ENDOSCOPY;  Service: Gastroenterology;  Laterality: N/A;   ESOPHAGOGASTRODUODENOSCOPY (EGD) WITH PROPOFOL  N/A 08/11/2022   Procedure:  ESOPHAGOGASTRODUODENOSCOPY (EGD) WITH PROPOFOL ;  Surgeon: Marnee Sink, MD;  Location: [facility] ENDOSCOPY;  Service: Endoscopy;  Laterality: N/A;   HERNIA REPAIR     IR BONE MARROW BIOPSY & ASPIRATION  04/10/2023   PERICARDIOCENTESIS N/A 08/29/2022   Procedure: PERICARDIOCENTESIS;  Surgeon: Sammy Crisp, MD;  Location: [facility] INVASIVE CV LAB;  Service: Cardiovascular;  Laterality: N/A;   POLYPECTOMY  08/21/2022   Procedure: POLYPECTOMY;  Surgeon: Quintin Buckle, DO;  Location: [facility] ENDOSCOPY;  Service: Gastroenterology;;    HEMATOLOGY/ONCOLOGY HISTORY:  Oncology History  Malignant neoplasm of sigmoid colon (HCC)  08/25/2022 Initial Diagnosis   Malignant neoplasm of sigmoid colon (HCC)   03/21/2023 Cancer Staging   Staging form: Colon and Rectum, AJCC 8th Edition - Pathologic stage from 03/21/2023: Stage I (pT1, pN0, cM0) - Signed by Avonne Boettcher, MD on 03/21/2023 Stage prefix: Initial diagnosis Total positive nodes: 0   AML (acute myeloblastic leukemia) (HCC)  05/01/2023 Initial Diagnosis   AML (acute myeloblastic leukemia) (HCC)   05/09/2023 -  Chemotherapy   Patient is on Treatment Plan : LEUKEMIA AML Decitabine  weekly x 4  q28d       ALLERGIES:  has no known allergies.  MEDICATIONS:  Current Outpatient Medications  Medication Sig Dispense Refill   acetaminophen  (TYLENOL ) 650 MG CR tablet Take 650 mg by mouth every 6 (six) hours as needed for pain (Headache).     BD PEN NEEDLE NANO 2ND GEN 32G X 4 MM MISC USE 1 PEN NEEDLE EVERY DAY AS DIRECTED 100 each 4   DULoxetine  (CYMBALTA ) 60 MG capsule Take 1 capsule (60 mg total) by mouth  2 (two) times daily. 14 capsule 0   ferrous sulfate  325 (65 FE) MG tablet Take 1 tablet (325 mg total) by mouth 2 (two) times daily with a meal for 14 days. 28 tablet 0   hydrOXYzine  (ATARAX ) 25 MG tablet Take 1 tablet (25 mg total) by mouth 3 (three) times daily as needed for itching. 10 tablet 0   latanoprost  (XALATAN ) 0.005 % ophthalmic solution Place  1 drop into both eyes at bedtime. 2.5 mL 0   melatonin 5 MG TABS Take 1 tablet (5 mg total) by mouth at bedtime. 7 tablet 0   metoprolol  succinate (TOPROL -XL) 50 MG 24 hr tablet Take 1 tablet (50 mg total) by mouth daily. Take with or immediately following a meal. 7 tablet 0   mirtazapine  (REMERON ) 15 MG tablet Take 1 tablet (15 mg total) by mouth daily. 7 tablet 0   omeprazole  (PRILOSEC) 20 MG capsule Take 20 mg by mouth 2 (two) times daily.     ondansetron  (ZOFRAN ) 4 MG tablet Take 1 tablet (4 mg total) by mouth every 6 (six) hours as needed for nausea. 20 tablet 0   ondansetron  (ZOFRAN ) 8 MG tablet Take 1 tablet (8 mg total) by mouth every 8 (eight) hours as needed for nausea or vomiting. 30 tablet 1   oxybutynin  (DITROPAN -XL) 10 MG 24 hr tablet Take 1 tablet (10 mg total) by mouth every evening. 7 tablet 0   polyethylene glycol powder (GLYCOLAX /MIRALAX ) 17 GM/SCOOP powder Take 17 g by mouth daily. 238 g 0   prochlorperazine  (COMPAZINE ) 10 MG tablet Take 1 tablet (10 mg total) by mouth every 6 (six) hours as needed for nausea or vomiting. 30 tablet 1   senna-docusate (FT STOOL SOFTENER) 8.6-50 MG tablet Take 2 tablets by mouth daily as needed for mild constipation. 14 tablet 0   sodium phosphate (FLEET) ENEM Place 133 mLs (1 enema total) rectally daily as needed for severe constipation. 133 mL 0   No current facility-administered medications for this visit.    VITAL SIGNS: There were no vitals taken for this visit. There were no vitals filed for this visit.  Estimated body mass index is 28.25 kg/m as calculated from the following:   Height as of 06/29/23: 6\' 2"  (1.88 m).   Weight as of 06/29/23: 220 lb (99.8 kg).  LABS: CBC:    Component Value Date/Time   WBC 2.9 (L) 06/29/2023 1658   HGB 5.3 (L) 06/29/2023 1658   HGB 6.2 (LL) 06/06/2023 1050   HCT 17.7 (L) 06/29/2023 1658   PLT 41 (L) 06/29/2023 1658   PLT 24 (L) 06/06/2023 1050   MCV 96.2 06/29/2023 1658   NEUTROABS 0.3 (LL)  06/29/2023 1658   LYMPHSABS 2.4 06/29/2023 1658   MONOABS 0.2 06/29/2023 1658   EOSABS 0.0 06/29/2023 1658   BASOSABS 0.0 06/29/2023 1658   Comprehensive Metabolic Panel:    Component Value Date/Time   NA 138 06/29/2023 1658   K 4.0 06/29/2023 1658   CL 111 06/29/2023 1658   CO2 19 (L) 06/29/2023 1658   BUN 35 (H) 06/29/2023 1658   CREATININE 2.71 (H) 06/29/2023 1658   CREATININE 3.53 (H) 06/06/2023 1050   CREATININE 1.69 (H) 07/12/2021 1516   GLUCOSE 139 (H) 06/29/2023 1658   CALCIUM 8.0 (L) 06/29/2023 1658   AST 17 06/29/2023 1658   AST 16 06/06/2023 1050   ALT 8 06/29/2023 1658   ALT 9 06/06/2023 1050   ALKPHOS 81 06/29/2023 1658   BILITOT 0.6 06/29/2023  1658   BILITOT 0.7 06/06/2023 1050   PROT 7.1 06/29/2023 1658   ALBUMIN  2.2 (L) 06/29/2023 1658    RADIOGRAPHIC STUDIES: No results found.  PERFORMANCE STATUS (ECOG) : 2 - Symptomatic, <50% confined to bed  Review of Systems Unless otherwise noted, a complete review of systems is negative.  Physical Exam General: NAD Cardiovascular: regular rate and rhythm Pulmonary: clear ant fields Abdomen: soft, nontender, + bowel sounds GU: no suprapubic tenderness Extremities: no edema, no joint deformities Skin: Sacral ulcer reported but not visualized Neurological: Weakness but otherwise nonfocal  IMPRESSION: Patient with AML not currently on treatment.  Has had 2 cycles of decitabine , both resulting in hospitalization.  Previous plan was for patient to transfer back to SNF with hospice.  However, he opted to continue transfusions.  Patient was in the emergency department on 06/29/2023 after critical labs at [facility].  He received 2 units PRBC.  Patient seen today in clinic.  Hemoglobin 6.7, ANC 0.4.  Patient denies any symptomatic complaints or concerns.  He says he feels "great."  Discussed overall goals.  Patient verbalized understanding that untreated AML will result in overall decline in end of life.  Transfusions are  a temporizing measure and not sustainable.  However, patient states that he is not ready for hospice and would like to continue transfusions even if it meant just a short prolongation of life.  I tried calling patient's wife but was unable to reach her.  PLAN: -Continue current scope of treatment - Plan transfusion of 1 unit PRBC tomorrow - Follow-up labs/possible transfusion next week - Will plan to see patient again in 2 weeks - Recommend palliative care following at SNF transition to hospice if evidence of decline  Case and plan discussed with Dr. Randy Buttery   Patient expressed understanding and was in agreement with this plan. He also understands that He can call the clinic at any time with any questions, concerns, or complaints.     Time Total: 20 minutes  Visit consisted of counseling and education dealing with the complex and emotionally intense issues of symptom management and palliative care in the setting of serious and potentially life-threatening illness.Greater than 50%  of this time was spent counseling and coordinating care related to the above assessment and plan.  Signed by: Gerilyn Kobus, PhD, NP-C

## 2023-07-09 NOTE — Progress Notes (Addendum)
 1050- 07/09/2023- Critical value reported by Chase/Kim R in cancer center lab. Hgb 6.7 and anc of 0.4. read back process performed with lab tech. [name], NP provided critical value. Read back process performed with Jerrilyn Moras, NP. Provider ordered - 1 unit of blood tomorrow.

## 2023-07-10 ENCOUNTER — Inpatient Hospital Stay

## 2023-07-10 DIAGNOSIS — C9200 Acute myeloblastic leukemia, not having achieved remission: Secondary | ICD-10-CM | POA: Diagnosis not present

## 2023-07-10 DIAGNOSIS — D649 Anemia, unspecified: Secondary | ICD-10-CM

## 2023-07-10 MED ORDER — ACETAMINOPHEN 325 MG PO TABS
650.0000 mg | ORAL_TABLET | Freq: Once | ORAL | Status: AC
Start: 2023-07-10 — End: 2023-07-10
  Administered 2023-07-10: 650 mg via ORAL
  Filled 2023-07-10: qty 2

## 2023-07-10 MED ORDER — DIPHENHYDRAMINE HCL 25 MG PO CAPS
25.0000 mg | ORAL_CAPSULE | Freq: Once | ORAL | Status: AC
  Administered 2023-07-10: 25 mg via ORAL
  Filled 2023-07-10: qty 1

## 2023-07-10 MED ORDER — SODIUM CHLORIDE 0.9% IV SOLUTION
250.0000 mL | INTRAVENOUS | Status: DC
  Administered 2023-07-10: 100 mL via INTRAVENOUS
  Filled 2023-07-10: qty 250

## 2023-07-10 NOTE — Patient Instructions (Signed)
 Blood Transfusion, Adult A blood transfusion is a procedure in which you receive blood through an IV tube. You may need this procedure because of: A bleeding disorder. An illness. An injury. A surgery. The blood may come from someone else (a donor). You may also be able to donate blood for yourself before a surgery. The blood given in a transfusion may be made up of different types of cells. You may get: Red blood cells. These carry oxygen to the cells in the body. Platelets. These help your blood to clot. Plasma. This is the liquid part of your blood. It carries proteins and other substances through the body. White blood cells. These help you fight infections. If you have a clotting disorder, you may also get other types of blood products. Depending on the type of blood product, this procedure may take 1-4 hours to complete. Tell your doctor about: Any bleeding problems you have. Any reactions you have had during a blood transfusion in the past. Any allergies you have. All medicines you are taking, including vitamins, herbs, eye drops, creams, and over-the-counter medicines. Any surgeries you have had. Any medical conditions you have. Whether you are pregnant or may be pregnant. What are the risks? Talk with your health care provider about risks. The most common problems include: A mild allergic reaction. This includes red, swollen areas of skin (hives) and itching. Fever or chills. This may be the body's response to new blood cells received. This may happen during or up to 4 hours after the transfusion. More serious problems may include: A serious allergic reaction. This includes breathing trouble or swelling around the face and lips. Too much fluid in the lungs. This may cause breathing problems. Lung injury. This causes breathing trouble and low oxygen in the blood. This can happen within hours of the transfusion or days later. Too much iron. This can happen after getting many blood  transfusions over a period of time. An infection or virus passed through the blood. This is rare. Donated blood is carefully tested before it is given. Your body's defense system (immune system) trying to attack the new blood cells. This is rare. Symptoms may include fever, chills, nausea, low blood pressure, and low back or chest pain. Donated cells attacking healthy tissues. This is rare. What happens before the procedure? You will have a blood test to find out your blood type. The test also finds out what type of blood your body will accept and matches it to the donor type. If you are going to have a planned surgery, you may be able to donate your own blood. This may be done in case you need a transfusion. You will have your temperature, blood pressure, and pulse checked. You may receive medicine to help prevent an allergic reaction. This may be done if you have had a reaction to a transfusion before. This medicine may be given to you by mouth or through an IV tube. What happens during the procedure?  An IV tube will be put into one of your veins. The bag of blood will be attached to your IV tube. Then, the blood will enter through your vein. Your temperature, blood pressure, and pulse will be checked often. This is done to find early signs of a transfusion reaction. Tell your nurse right away if you have any of these symptoms: Shortness of breath or trouble breathing. Chest or back pain. Fever or chills. Red, swollen areas of skin or itching. If you have any signs  or symptoms of a reaction, your transfusion will be stopped. You may also be given medicine. When the transfusion is finished, your IV tube will be taken out. Pressure may be put on the IV site for a few minutes. A bandage (dressing) will be put on the IV site. The procedure may vary among doctors and hospitals. What happens after the procedure? You will be monitored until you leave the hospital or clinic. This includes  checking your temperature, blood pressure, pulse, breathing rate, and blood oxygen level. Your blood may be tested to see how you have responded to the transfusion. You may be warmed with fluids or blankets. This is done to keep the temperature of your body normal. If you have your procedure in an outpatient setting, you will be told whom to contact to report any reactions. Where to find more information Visit the [employer]: redcross.org Summary A blood transfusion is a procedure in which you receive blood through an IV tube. The blood you are given may be made up of different blood cells. You may receive red blood cells, platelets, plasma, or white blood cells. Your temperature, blood pressure, and pulse will be checked often. After the procedure, your blood may be tested to see how you have responded. This information is not intended to replace advice given to you by your health care provider. Make sure you discuss any questions you have with your health care provider. Document Revised: 05/06/2021 Document Reviewed: 05/06/2021 Elsevier Patient Education  2024 [employer].

## 2023-07-11 LAB — TYPE AND SCREEN
ABO/RH(D): AB POS
Antibody Screen: NEGATIVE
Unit division: 0

## 2023-07-11 LAB — BPAM RBC
Blood Product Expiration Date: 202505232359
ISSUE DATE / TIME: 202505200923
Unit Type and Rh: 5100

## 2023-07-17 ENCOUNTER — Other Ambulatory Visit: Payer: Self-pay | Admitting: Oncology

## 2023-07-17 ENCOUNTER — Other Ambulatory Visit: Payer: Self-pay

## 2023-07-17 ENCOUNTER — Inpatient Hospital Stay

## 2023-07-17 ENCOUNTER — Telehealth: Payer: Self-pay

## 2023-07-17 DIAGNOSIS — C9200 Acute myeloblastic leukemia, not having achieved remission: Secondary | ICD-10-CM | POA: Diagnosis not present

## 2023-07-17 DIAGNOSIS — D649 Anemia, unspecified: Secondary | ICD-10-CM

## 2023-07-17 LAB — CBC WITH DIFFERENTIAL (CANCER CENTER ONLY)
Abs Immature Granulocytes: 0.01 10*3/uL (ref 0.00–0.07)
Basophils Absolute: 0 10*3/uL (ref 0.0–0.1)
Basophils Relative: 0 %
Eosinophils Absolute: 0 10*3/uL (ref 0.0–0.5)
Eosinophils Relative: 0 %
HCT: 20.9 % — ABNORMAL LOW (ref 39.0–52.0)
Hemoglobin: 6.4 g/dL — CL (ref 13.0–17.0)
Immature Granulocytes: 0 %
Lymphocytes Relative: 64 %
Lymphs Abs: 2.2 10*3/uL (ref 0.7–4.0)
MCH: 29.2 pg (ref 26.0–34.0)
MCHC: 30.6 g/dL (ref 30.0–36.0)
MCV: 95.4 fL (ref 80.0–100.0)
Monocytes Absolute: 0.4 10*3/uL (ref 0.1–1.0)
Monocytes Relative: 11 %
Neutro Abs: 0.8 10*3/uL — ABNORMAL LOW (ref 1.7–7.7)
Neutrophils Relative %: 25 %
Platelet Count: 47 10*3/uL — ABNORMAL LOW (ref 150–400)
RBC: 2.19 MIL/uL — ABNORMAL LOW (ref 4.22–5.81)
RDW: 19.9 % — ABNORMAL HIGH (ref 11.5–15.5)
Smear Review: NORMAL
WBC Count: 3.4 10*3/uL — ABNORMAL LOW (ref 4.0–10.5)
nRBC: 0 % (ref 0.0–0.2)

## 2023-07-17 LAB — PREPARE RBC (CROSSMATCH)

## 2023-07-17 LAB — SAMPLE TO BLOOD BANK

## 2023-07-17 NOTE — Telephone Encounter (Signed)
 Critical lab value for Hbg 6.4 notified provider Dr. Seretha Dance and [facility] providers.

## 2023-07-17 NOTE — Addendum Note (Signed)
 Addended by: Gerilyn Kobus R on: 07/17/2023 01:07 PM   Modules accepted: Orders

## 2023-07-18 ENCOUNTER — Inpatient Hospital Stay

## 2023-07-18 DIAGNOSIS — D649 Anemia, unspecified: Secondary | ICD-10-CM

## 2023-07-18 DIAGNOSIS — C9200 Acute myeloblastic leukemia, not having achieved remission: Secondary | ICD-10-CM | POA: Diagnosis not present

## 2023-07-18 MED ORDER — SODIUM CHLORIDE 0.9% IV SOLUTION
250.0000 mL | INTRAVENOUS | Status: DC
  Administered 2023-07-18: 250 mL via INTRAVENOUS
  Filled 2023-07-18: qty 250

## 2023-07-18 MED ORDER — ACETAMINOPHEN 325 MG PO TABS
650.0000 mg | ORAL_TABLET | Freq: Once | ORAL | Status: AC
  Administered 2023-07-18: 650 mg via ORAL
  Filled 2023-07-18: qty 2

## 2023-07-19 LAB — BPAM RBC
Blood Product Expiration Date: 202506132359
ISSUE DATE / TIME: 202505281004
Unit Type and Rh: 202506132359
Unit Type and Rh: 6200

## 2023-07-19 LAB — TYPE AND SCREEN
ABO/RH(D): AB POS
Antibody Screen: NEGATIVE
Unit division: 0

## 2023-07-23 ENCOUNTER — Inpatient Hospital Stay: Attending: Oncology

## 2023-07-23 ENCOUNTER — Encounter: Payer: Self-pay | Admitting: Hospice and Palliative Medicine

## 2023-07-23 ENCOUNTER — Inpatient Hospital Stay (HOSPITAL_BASED_OUTPATIENT_CLINIC_OR_DEPARTMENT_OTHER): Admitting: Hospice and Palliative Medicine

## 2023-07-23 VITALS — BP 152/79 | HR 76 | Temp 97.1°F | Resp 16

## 2023-07-23 DIAGNOSIS — C9200 Acute myeloblastic leukemia, not having achieved remission: Secondary | ICD-10-CM | POA: Diagnosis present

## 2023-07-23 DIAGNOSIS — Z79899 Other long term (current) drug therapy: Secondary | ICD-10-CM | POA: Diagnosis not present

## 2023-07-23 DIAGNOSIS — D649 Anemia, unspecified: Secondary | ICD-10-CM

## 2023-07-23 DIAGNOSIS — I10 Essential (primary) hypertension: Secondary | ICD-10-CM | POA: Insufficient documentation

## 2023-07-23 DIAGNOSIS — E785 Hyperlipidemia, unspecified: Secondary | ICD-10-CM | POA: Insufficient documentation

## 2023-07-23 DIAGNOSIS — Z66 Do not resuscitate: Secondary | ICD-10-CM | POA: Diagnosis not present

## 2023-07-23 DIAGNOSIS — C187 Malignant neoplasm of sigmoid colon: Secondary | ICD-10-CM

## 2023-07-23 LAB — CBC WITH DIFFERENTIAL (CANCER CENTER ONLY)
Abs Immature Granulocytes: 0.02 10*3/uL (ref 0.00–0.07)
Basophils Absolute: 0 10*3/uL (ref 0.0–0.1)
Basophils Relative: 0 %
Eosinophils Absolute: 0 10*3/uL (ref 0.0–0.5)
Eosinophils Relative: 0 %
HCT: 26 % — ABNORMAL LOW (ref 39.0–52.0)
Hemoglobin: 7.9 g/dL — ABNORMAL LOW (ref 13.0–17.0)
Immature Granulocytes: 1 %
Lymphocytes Relative: 60 %
Lymphs Abs: 2.4 10*3/uL (ref 0.7–4.0)
MCH: 29.3 pg (ref 26.0–34.0)
MCHC: 30.4 g/dL (ref 30.0–36.0)
MCV: 96.3 fL (ref 80.0–100.0)
Monocytes Absolute: 0.3 10*3/uL (ref 0.1–1.0)
Monocytes Relative: 8 %
Neutro Abs: 1.2 10*3/uL — ABNORMAL LOW (ref 1.7–7.7)
Neutrophils Relative %: 31 %
Platelet Count: 60 10*3/uL — ABNORMAL LOW (ref 150–400)
RBC: 2.7 MIL/uL — ABNORMAL LOW (ref 4.22–5.81)
RDW: 18.6 % — ABNORMAL HIGH (ref 11.5–15.5)
WBC Count: 3.9 10*3/uL — ABNORMAL LOW (ref 4.0–10.5)
nRBC: 0.8 % — ABNORMAL HIGH (ref 0.0–0.2)

## 2023-07-23 LAB — SAMPLE TO BLOOD BANK

## 2023-07-23 NOTE — Patient Instructions (Signed)
 Cnl blood transfusion tomorrow- not needed.  Return in 1 week for lab-day 1 and possible 1 unit blood day 2  Return in 2 weeks for lab/smc- [name] or [name]- day 1; and possible 1 unit blood day 2  Return in 3 weeks for lab-day 1 and possible blood 1 unit day 2  Return in 4 weeks for lab/Dr. Randy Buttery day 1; and possible blood 1 unit day 2

## 2023-07-23 NOTE — Progress Notes (Signed)
 Palliative Medicine [facility] at [facility] Telephone:([phone] Fax:(336) [phone]   Name: Robert [name] Date: 07/23/2023 MRN: [id number]  DOB: 1945-03-27  Patient Care Team: Theron Flavin, MD as PCP - General (Internal Medicine) Avonne Boettcher, MD as Consulting Physician (Oncology)    REASON FOR CONSULTATION: Robert [name] is a 78 y.o. male with multiple medical problems including AML not currently in remission.  Patient has been hospitalized after each trial of decitabine .  Further chemotherapy was not recommended.  Hospice and comfort care were discussed but patient opted to continue transfusions.  Palliative care was consulted to address goals and manage ongoing symptoms.  SOCIAL HISTORY:     reports that he has never smoked. He has never used smokeless tobacco. He reports that he does not currently use alcohol. He reports that he does not use drugs.  Patient married.  He is a resident at [employer].  ADVANCE DIRECTIVES:    CODE STATUS: DNR  PAST MEDICAL HISTORY: Past Medical History:  Diagnosis Date   Allergy    Cellulitis    Diabetes mellitus without complication (HCC)    Hyperlipidemia    Hypertension    Leukemia (HCC)    Peripheral vascular disease (HCC)     PAST SURGICAL HISTORY:  Past Surgical History:  Procedure Laterality Date   BIOPSY  08/21/2022   Procedure: BIOPSY;  Surgeon: Quintin Buckle, DO;  Location: [facility] ENDOSCOPY;  Service: Gastroenterology;;   COLONOSCOPY WITH PROPOFOL  N/A 10/02/2017   Procedure: COLONOSCOPY WITH PROPOFOL ;  Surgeon: Luke Salaam, MD;  Location: [facility] ENDOSCOPY;  Service: Gastroenterology;  Laterality: N/A;   COLONOSCOPY WITH PROPOFOL  N/A 08/21/2022   Procedure: COLONOSCOPY WITH PROPOFOL ;  Surgeon: Quintin Buckle, DO;  Location: [facility] ENDOSCOPY;  Service: Gastroenterology;  Laterality: N/A;   ESOPHAGOGASTRODUODENOSCOPY (EGD) WITH PROPOFOL  N/A 08/11/2022   Procedure:  ESOPHAGOGASTRODUODENOSCOPY (EGD) WITH PROPOFOL ;  Surgeon: Marnee Sink, MD;  Location: [facility] ENDOSCOPY;  Service: Endoscopy;  Laterality: N/A;   HERNIA REPAIR     IR BONE MARROW BIOPSY & ASPIRATION  04/10/2023   PERICARDIOCENTESIS N/A 08/29/2022   Procedure: PERICARDIOCENTESIS;  Surgeon: Sammy Crisp, MD;  Location: [facility] INVASIVE CV LAB;  Service: Cardiovascular;  Laterality: N/A;   POLYPECTOMY  08/21/2022   Procedure: POLYPECTOMY;  Surgeon: Quintin Buckle, DO;  Location: [facility] ENDOSCOPY;  Service: Gastroenterology;;    HEMATOLOGY/ONCOLOGY HISTORY:  Oncology History  Malignant neoplasm of sigmoid colon (HCC)  08/25/2022 Initial Diagnosis   Malignant neoplasm of sigmoid colon (HCC)   03/21/2023 Cancer Staging   Staging form: Colon and Rectum, AJCC 8th Edition - Pathologic stage from 03/21/2023: Stage I (pT1, pN0, cM0) - Signed by Avonne Boettcher, MD on 03/21/2023 Stage prefix: Initial diagnosis Total positive nodes: 0   AML (acute myeloblastic leukemia) (HCC)  05/01/2023 Initial Diagnosis   AML (acute myeloblastic leukemia) (HCC)   05/09/2023 -  Chemotherapy   Patient is on Treatment Plan : LEUKEMIA AML Decitabine  weekly x 4  q28d       ALLERGIES:  has no known allergies.  MEDICATIONS:  Current Outpatient Medications  Medication Sig Dispense Refill   acetaminophen  (TYLENOL ) 650 MG CR tablet Take 650 mg by mouth every 6 (six) hours as needed for pain (Headache).     BD PEN NEEDLE NANO 2ND GEN 32G X 4 MM MISC USE 1 PEN NEEDLE EVERY DAY AS DIRECTED 100 each 4   DULoxetine  (CYMBALTA ) 60 MG capsule Take 1 capsule (60 mg total) by mouth  2 (two) times daily. 14 capsule 0   hydrOXYzine  (ATARAX ) 25 MG tablet Take 1 tablet (25 mg total) by mouth 3 (three) times daily as needed for itching. 10 tablet 0   latanoprost  (XALATAN ) 0.005 % ophthalmic solution Place 1 drop into both eyes at bedtime. 2.5 mL 0   melatonin 5 MG TABS Take 1 tablet (5 mg total) by mouth at bedtime. 7 tablet 0    metoprolol  succinate (TOPROL -XL) 50 MG 24 hr tablet Take 1 tablet (50 mg total) by mouth daily. Take with or immediately following a meal. 7 tablet 0   mirtazapine  (REMERON ) 15 MG tablet Take 1 tablet (15 mg total) by mouth daily. 7 tablet 0   omeprazole  (PRILOSEC) 20 MG capsule Take 20 mg by mouth 2 (two) times daily.     ondansetron  (ZOFRAN ) 4 MG tablet Take 1 tablet (4 mg total) by mouth every 6 (six) hours as needed for nausea. 20 tablet 0   ondansetron  (ZOFRAN ) 8 MG tablet Take 1 tablet (8 mg total) by mouth every 8 (eight) hours as needed for nausea or vomiting. 30 tablet 1   oxybutynin  (DITROPAN -XL) 10 MG 24 hr tablet Take 1 tablet (10 mg total) by mouth every evening. 7 tablet 0   polyethylene glycol powder (GLYCOLAX /MIRALAX ) 17 GM/SCOOP powder Take 17 g by mouth daily. 238 g 0   prochlorperazine  (COMPAZINE ) 10 MG tablet Take 1 tablet (10 mg total) by mouth every 6 (six) hours as needed for nausea or vomiting. 30 tablet 1   senna-docusate (FT STOOL SOFTENER) 8.6-50 MG tablet Take 2 tablets by mouth daily as needed for mild constipation. 14 tablet 0   sodium phosphate (FLEET) ENEM Place 133 mLs (1 enema total) rectally daily as needed for severe constipation. 133 mL 0   ferrous sulfate  325 (65 FE) MG tablet Take 1 tablet (325 mg total) by mouth 2 (two) times daily with a meal for 14 days. 28 tablet 0   No current facility-administered medications for this visit.    VITAL SIGNS: There were no vitals taken for this visit. There were no vitals filed for this visit.  Estimated body mass index is 28.25 kg/m as calculated from the following:   Height as of 06/29/23: 6\' 2"  (1.88 m).   Weight as of 06/29/23: 220 lb (99.8 kg).  LABS: CBC:    Component Value Date/Time   WBC 3.4 (L) 07/17/2023 1037   WBC 2.9 (L) 06/29/2023 1658   HGB 6.4 (LL) 07/17/2023 1037   HCT 20.9 (L) 07/17/2023 1037   PLT 47 (L) 07/17/2023 1037   MCV 95.4 07/17/2023 1037   NEUTROABS 0.8 (L) 07/17/2023 1037   LYMPHSABS  2.2 07/17/2023 1037   MONOABS 0.4 07/17/2023 1037   EOSABS 0.0 07/17/2023 1037   BASOSABS 0.0 07/17/2023 1037   Comprehensive Metabolic Panel:    Component Value Date/Time   NA 137 07/09/2023 0944   K 3.7 07/09/2023 0944   CL 105 07/09/2023 0944   CO2 24 07/09/2023 0944   BUN 24 (H) 07/09/2023 0944   CREATININE 2.10 (H) 07/09/2023 0944   CREATININE 1.69 (H) 07/12/2021 1516   GLUCOSE 114 (H) 07/09/2023 0944   CALCIUM 8.1 (L) 07/09/2023 0944   AST 15 07/09/2023 0944   ALT 7 07/09/2023 0944   ALKPHOS 80 07/09/2023 0944   BILITOT 0.7 07/09/2023 0944   PROT 7.2 07/09/2023 0944   ALBUMIN  2.2 (L) 07/09/2023 0944    RADIOGRAPHIC STUDIES: No results found.  PERFORMANCE STATUS (ECOG) :  2 - Symptomatic, <50% confined to bed  Review of Systems Unless otherwise noted, a complete review of systems is negative.  Physical Exam General: NAD Cardiovascular: regular rate and rhythm Pulmonary: clear ant fields Abdomen: soft, nontender, + bowel sounds GU: no suprapubic tenderness Extremities: no edema, no joint deformities Skin: Sacral ulcer reported but not visualized Neurological: Weakness but otherwise nonfocal  IMPRESSION: Patient with AML not currently on treatment.  Has had 2 cycles of decitabine , both resulting in hospitalization.  Previous plan was for patient to transfer back to SNF with hospice.  However, he opted to continue transfusions.  Last PRBC transfusion 07/20/2023, which patient tolerated well.  Patient seen today in clinic for follow-up.  Hemoglobin 7.9 (up from 6.4 last week), ANC 1.2 (up from 0.8 last week).  Patient denies any symptomatic complaints or concerns today.  He feels he is doing well.  He remains committed to the idea of pursuing further transfusions as needed.  Patient does not require transfusion this week.  Will bring him back next week for repeat labs and ongoing supportive care.  Continue to recommend that he is followed by palliative care at SNF  with plan for future transition to hospice when patient is no longer able to receive transfusions.   PLAN: -Continue current scope of treatment - Follow-up labs/possible transfusion next week - Recommend palliative care following at SNF transition to hospice if evidence of decline  Case and plan discussed with Dr. Randy Buttery   Patient expressed understanding and was in agreement with this plan. He also understands that He can call the clinic at any time with any questions, concerns, or complaints.     Time Total: 20 minutes  Visit consisted of counseling and education dealing with the complex and emotionally intense issues of symptom management and palliative care in the setting of serious and potentially life-threatening illness.Greater than 50%  of this time was spent counseling and coordinating care related to the above assessment and plan.  Signed by: Gerilyn Kobus, PhD, NP-C

## 2023-07-24 ENCOUNTER — Inpatient Hospital Stay

## 2023-07-30 ENCOUNTER — Other Ambulatory Visit: Payer: Self-pay | Admitting: *Deleted

## 2023-07-30 ENCOUNTER — Inpatient Hospital Stay

## 2023-07-30 DIAGNOSIS — D649 Anemia, unspecified: Secondary | ICD-10-CM

## 2023-07-30 DIAGNOSIS — C9200 Acute myeloblastic leukemia, not having achieved remission: Secondary | ICD-10-CM

## 2023-07-30 DIAGNOSIS — Z79899 Other long term (current) drug therapy: Secondary | ICD-10-CM

## 2023-07-30 DIAGNOSIS — N179 Acute kidney failure, unspecified: Secondary | ICD-10-CM

## 2023-07-30 LAB — CBC WITH DIFFERENTIAL (CANCER CENTER ONLY)
Abs Immature Granulocytes: 0.03 10*3/uL (ref 0.00–0.07)
Basophils Absolute: 0 10*3/uL (ref 0.0–0.1)
Basophils Relative: 0 %
Eosinophils Absolute: 0 10*3/uL (ref 0.0–0.5)
Eosinophils Relative: 0 %
HCT: 21.6 % — ABNORMAL LOW (ref 39.0–52.0)
Hemoglobin: 6.5 g/dL — CL (ref 13.0–17.0)
Immature Granulocytes: 1 %
Lymphocytes Relative: 50 %
Lymphs Abs: 2 10*3/uL (ref 0.7–4.0)
MCH: 28.3 pg (ref 26.0–34.0)
MCHC: 30.1 g/dL (ref 30.0–36.0)
MCV: 93.9 fL (ref 80.0–100.0)
Monocytes Absolute: 0.2 10*3/uL (ref 0.1–1.0)
Monocytes Relative: 6 %
Neutro Abs: 1.6 10*3/uL — ABNORMAL LOW (ref 1.7–7.7)
Neutrophils Relative %: 43 %
Platelet Count: 59 10*3/uL — ABNORMAL LOW (ref 150–400)
RBC: 2.3 MIL/uL — ABNORMAL LOW (ref 4.22–5.81)
RDW: 17 % — ABNORMAL HIGH (ref 11.5–15.5)
Smear Review: NORMAL
WBC Count: 3.9 10*3/uL — ABNORMAL LOW (ref 4.0–10.5)
nRBC: 1.3 % — ABNORMAL HIGH (ref 0.0–0.2)

## 2023-07-30 LAB — CMP (CANCER CENTER ONLY)
ALT: 7 U/L (ref 0–44)
AST: 13 U/L — ABNORMAL LOW (ref 15–41)
Albumin: 2 g/dL — ABNORMAL LOW (ref 3.5–5.0)
Alkaline Phosphatase: 71 U/L (ref 38–126)
Anion gap: 10 (ref 5–15)
BUN: 17 mg/dL (ref 8–23)
CO2: 25 mmol/L (ref 22–32)
Calcium: 7.9 mg/dL — ABNORMAL LOW (ref 8.9–10.3)
Chloride: 102 mmol/L (ref 98–111)
Creatinine: 1.6 mg/dL — ABNORMAL HIGH (ref 0.61–1.24)
GFR, Estimated: 44 mL/min — ABNORMAL LOW (ref >60)
Glucose, Bld: 113 mg/dL — ABNORMAL HIGH (ref 70–99)
Potassium: 3.4 mmol/L — ABNORMAL LOW (ref 3.5–5.1)
Sodium: 137 mmol/L (ref 135–145)
Total Bilirubin: 1.1 mg/dL (ref 0.0–1.2)
Total Protein: 7.5 g/dL (ref 6.5–8.1)

## 2023-07-30 LAB — PREPARE RBC (CROSSMATCH)

## 2023-07-31 ENCOUNTER — Inpatient Hospital Stay

## 2023-07-31 DIAGNOSIS — C9200 Acute myeloblastic leukemia, not having achieved remission: Secondary | ICD-10-CM

## 2023-07-31 DIAGNOSIS — D649 Anemia, unspecified: Secondary | ICD-10-CM

## 2023-07-31 MED ORDER — ACETAMINOPHEN 325 MG PO TABS
650.0000 mg | ORAL_TABLET | Freq: Once | ORAL | Status: AC
  Administered 2023-07-31: 650 mg via ORAL
  Filled 2023-07-31: qty 2

## 2023-07-31 MED ORDER — SODIUM CHLORIDE 0.9% IV SOLUTION
250.0000 mL | INTRAVENOUS | Status: DC
  Administered 2023-07-31: 250 mL via INTRAVENOUS
  Filled 2023-07-31: qty 250

## 2023-07-31 MED ORDER — DIPHENHYDRAMINE HCL 25 MG PO CAPS
25.0000 mg | ORAL_CAPSULE | Freq: Once | ORAL | Status: AC
  Administered 2023-07-31: 25 mg via ORAL
  Filled 2023-07-31: qty 1

## 2023-08-01 LAB — TYPE AND SCREEN
ABO/RH(D): AB POS
Antibody Screen: NEGATIVE
Unit division: 0

## 2023-08-01 LAB — BPAM RBC
Blood Product Expiration Date: 202507042359
ISSUE DATE / TIME: 202506101031
Unit Type and Rh: 202507042359
Unit Type and Rh: 6200

## 2023-08-06 ENCOUNTER — Inpatient Hospital Stay

## 2023-08-06 ENCOUNTER — Other Ambulatory Visit: Payer: Self-pay | Admitting: *Deleted

## 2023-08-06 ENCOUNTER — Inpatient Hospital Stay (HOSPITAL_BASED_OUTPATIENT_CLINIC_OR_DEPARTMENT_OTHER): Admitting: Hospice and Palliative Medicine

## 2023-08-06 VITALS — BP 158/91 | HR 81 | Temp 98.0°F | Resp 17 | Wt 217.0 lb

## 2023-08-06 DIAGNOSIS — C9200 Acute myeloblastic leukemia, not having achieved remission: Secondary | ICD-10-CM

## 2023-08-06 DIAGNOSIS — D649 Anemia, unspecified: Secondary | ICD-10-CM

## 2023-08-06 DIAGNOSIS — N179 Acute kidney failure, unspecified: Secondary | ICD-10-CM

## 2023-08-06 DIAGNOSIS — Z79899 Other long term (current) drug therapy: Secondary | ICD-10-CM

## 2023-08-06 DIAGNOSIS — Z515 Encounter for palliative care: Secondary | ICD-10-CM

## 2023-08-06 LAB — CBC WITH DIFFERENTIAL (CANCER CENTER ONLY)
Abs Immature Granulocytes: 0.01 10*3/uL (ref 0.00–0.07)
Basophils Absolute: 0 10*3/uL (ref 0.0–0.1)
Basophils Relative: 0 %
Eosinophils Absolute: 0 10*3/uL (ref 0.0–0.5)
Eosinophils Relative: 0 %
HCT: 22.3 % — ABNORMAL LOW (ref 39.0–52.0)
Hemoglobin: 6.8 g/dL — CL (ref 13.0–17.0)
Immature Granulocytes: 0 %
Lymphocytes Relative: 62 %
Lymphs Abs: 1.9 10*3/uL (ref 0.7–4.0)
MCH: 27.4 pg (ref 26.0–34.0)
MCHC: 30.5 g/dL (ref 30.0–36.0)
MCV: 89.9 fL (ref 80.0–100.0)
Monocytes Absolute: 0.2 10*3/uL (ref 0.1–1.0)
Monocytes Relative: 6 %
Neutro Abs: 1 10*3/uL — ABNORMAL LOW (ref 1.7–7.7)
Neutrophils Relative %: 32 %
Platelet Count: 59 10*3/uL — ABNORMAL LOW (ref 150–400)
RBC: 2.48 MIL/uL — ABNORMAL LOW (ref 4.22–5.81)
RDW: 17.2 % — ABNORMAL HIGH (ref 11.5–15.5)
Smear Review: NORMAL
WBC Count: 3.1 10*3/uL — ABNORMAL LOW (ref 4.0–10.5)
nRBC: 2.2 % — ABNORMAL HIGH (ref 0.0–0.2)

## 2023-08-06 LAB — CMP (CANCER CENTER ONLY)
ALT: 8 U/L (ref 0–44)
AST: 16 U/L (ref 15–41)
Albumin: 2 g/dL — ABNORMAL LOW (ref 3.5–5.0)
Alkaline Phosphatase: 70 U/L (ref 38–126)
Anion gap: 8 (ref 5–15)
BUN: 17 mg/dL (ref 8–23)
CO2: 27 mmol/L (ref 22–32)
Calcium: 7.9 mg/dL — ABNORMAL LOW (ref 8.9–10.3)
Chloride: 99 mmol/L (ref 98–111)
Creatinine: 1.66 mg/dL — ABNORMAL HIGH (ref 0.61–1.24)
GFR, Estimated: 42 mL/min — ABNORMAL LOW (ref >60)
Glucose, Bld: 118 mg/dL — ABNORMAL HIGH (ref 70–99)
Potassium: 3.7 mmol/L (ref 3.5–5.1)
Sodium: 134 mmol/L — ABNORMAL LOW (ref 135–145)
Total Bilirubin: 0.9 mg/dL (ref 0.0–1.2)
Total Protein: 7.3 g/dL (ref 6.5–8.1)

## 2023-08-06 LAB — PREPARE RBC (CROSSMATCH)

## 2023-08-06 NOTE — Progress Notes (Signed)
 1128-RN spoke with Dino Frank in [facility] lab - Critical hgb called - 6.8. read back process performed with lab tech.  1130-critical value given to Jerrilyn Moras, NP. Read back process performed with NP. Pt to receive 1 unit of blood.

## 2023-08-06 NOTE — Progress Notes (Signed)
 Palliative Medicine [facility] at [facility] Telephone:(336) [phone] Fax:(336) [phone]   Name: [name] Date: 08/06/2023 MRN: [id number]  DOB: Dec 07, 1945  Patient Care Team: Theron Flavin, MD as PCP - General (Internal Medicine) Avonne Boettcher, MD as Consulting Physician (Oncology)    REASON FOR CONSULTATION: Robert [name] is a 78 y.o. male with multiple medical problems including AML not currently in remission.  Patient has been hospitalized after each trial of decitabine .  Further chemotherapy was not recommended.  Hospice and comfort care were discussed but patient opted to continue transfusions.  Palliative care was consulted to address goals and manage ongoing symptoms.  SOCIAL HISTORY:     reports that he has never smoked. He has never used smokeless tobacco. He reports that he does not currently use alcohol. He reports that he does not use drugs.  Patient married.  He is a resident at [employer].  ADVANCE DIRECTIVES:    CODE STATUS: DNR  PAST MEDICAL HISTORY: Past Medical History:  Diagnosis Date   Allergy    Cellulitis    Diabetes mellitus without complication (HCC)    Hyperlipidemia    Hypertension    Leukemia (HCC)    Peripheral vascular disease (HCC)     PAST SURGICAL HISTORY:  Past Surgical History:  Procedure Laterality Date   BIOPSY  08/21/2022   Procedure: BIOPSY;  Surgeon: Quintin Buckle, DO;  Location: [facility] ENDOSCOPY;  Service: Gastroenterology;;   COLONOSCOPY WITH PROPOFOL  N/A 10/02/2017   Procedure: COLONOSCOPY WITH PROPOFOL ;  Surgeon: Luke Salaam, MD;  Location: [facility] ENDOSCOPY;  Service: Gastroenterology;  Laterality: N/A;   COLONOSCOPY WITH PROPOFOL  N/A 08/21/2022   Procedure: COLONOSCOPY WITH PROPOFOL ;  Surgeon: Quintin Buckle, DO;  Location: [facility] ENDOSCOPY;  Service: Gastroenterology;  Laterality: N/A;   ESOPHAGOGASTRODUODENOSCOPY (EGD) WITH PROPOFOL  N/A 08/11/2022   Procedure:  ESOPHAGOGASTRODUODENOSCOPY (EGD) WITH PROPOFOL ;  Surgeon: Marnee Sink, MD;  Location: [facility] ENDOSCOPY;  Service: Endoscopy;  Laterality: N/A;   HERNIA REPAIR     IR BONE MARROW BIOPSY & ASPIRATION  04/10/2023   PERICARDIOCENTESIS N/A 08/29/2022   Procedure: PERICARDIOCENTESIS;  Surgeon: Sammy Crisp, MD;  Location: [facility] INVASIVE CV LAB;  Service: Cardiovascular;  Laterality: N/A;   POLYPECTOMY  08/21/2022   Procedure: POLYPECTOMY;  Surgeon: Quintin Buckle, DO;  Location: [facility] ENDOSCOPY;  Service: Gastroenterology;;    HEMATOLOGY/ONCOLOGY HISTORY:  Oncology History  Malignant neoplasm of sigmoid colon (HCC)  08/25/2022 Initial Diagnosis   Malignant neoplasm of sigmoid colon (HCC)   03/21/2023 Cancer Staging   Staging form: Colon and Rectum, AJCC 8th Edition - Pathologic stage from 03/21/2023: Stage I (pT1, pN0, cM0) - Signed by Avonne Boettcher, MD on 03/21/2023 Stage prefix: Initial diagnosis Total positive nodes: 0   AML (acute myeloblastic leukemia) (HCC)  05/01/2023 Initial Diagnosis   AML (acute myeloblastic leukemia) (HCC)   05/09/2023 -  Chemotherapy   Patient is on Treatment Plan : LEUKEMIA AML Decitabine  weekly x 4  q28d       ALLERGIES:  has no known allergies.  MEDICATIONS:  Current Outpatient Medications  Medication Sig Dispense Refill   acetaminophen  (TYLENOL ) 650 MG CR tablet Take 650 mg by mouth every 6 (six) hours as needed for pain (Headache).     BD PEN NEEDLE NANO 2ND GEN 32G X 4 MM MISC USE 1 PEN NEEDLE EVERY DAY AS DIRECTED 100 each 4   DULoxetine  (CYMBALTA ) 60 MG capsule Take 1 capsule (60 mg total) by mouth  2 (two) times daily. 14 capsule 0   ferrous sulfate  325 (65 FE) MG tablet Take 1 tablet (325 mg total) by mouth 2 (two) times daily with a meal for 14 days. 28 tablet 0   hydrOXYzine  (ATARAX ) 25 MG tablet Take 1 tablet (25 mg total) by mouth 3 (three) times daily as needed for itching. 10 tablet 0   latanoprost  (XALATAN ) 0.005 % ophthalmic solution Place  1 drop into both eyes at bedtime. 2.5 mL 0   melatonin 5 MG TABS Take 1 tablet (5 mg total) by mouth at bedtime. 7 tablet 0   metoprolol  succinate (TOPROL -XL) 50 MG 24 hr tablet Take 1 tablet (50 mg total) by mouth daily. Take with or immediately following a meal. 7 tablet 0   mirtazapine  (REMERON ) 15 MG tablet Take 1 tablet (15 mg total) by mouth daily. 7 tablet 0   omeprazole  (PRILOSEC) 20 MG capsule Take 20 mg by mouth 2 (two) times daily.     ondansetron  (ZOFRAN ) 4 MG tablet Take 1 tablet (4 mg total) by mouth every 6 (six) hours as needed for nausea. 20 tablet 0   ondansetron  (ZOFRAN ) 8 MG tablet Take 1 tablet (8 mg total) by mouth every 8 (eight) hours as needed for nausea or vomiting. 30 tablet 1   oxybutynin  (DITROPAN -XL) 10 MG 24 hr tablet Take 1 tablet (10 mg total) by mouth every evening. 7 tablet 0   polyethylene glycol powder (GLYCOLAX /MIRALAX ) 17 GM/SCOOP powder Take 17 g by mouth daily. 238 g 0   prochlorperazine  (COMPAZINE ) 10 MG tablet Take 1 tablet (10 mg total) by mouth every 6 (six) hours as needed for nausea or vomiting. 30 tablet 1   senna-docusate (FT STOOL SOFTENER) 8.6-50 MG tablet Take 2 tablets by mouth daily as needed for mild constipation. 14 tablet 0   sodium phosphate (FLEET) ENEM Place 133 mLs (1 enema total) rectally daily as needed for severe constipation. 133 mL 0   No current facility-administered medications for this visit.    VITAL SIGNS: BP (!) 158/91 (Patient Position: Sitting)   Pulse 81   Temp 98 F (36.7 C) (Oral)   Resp 17   Wt 217 lb (98.4 kg) Comment: per patient -refused to stand  SpO2 100%   BMI 27.86 kg/m  Filed Weights   08/06/23 1120  Weight: 217 lb (98.4 kg)    Estimated body mass index is 27.86 kg/m as calculated from the following:   Height as of 06/29/23: 6' 2 (1.88 m).   Weight as of this encounter: 217 lb (98.4 kg).  LABS: CBC:    Component Value Date/Time   WBC 3.1 (L) 08/06/2023 1038   WBC 2.9 (L) 06/29/2023 1658   HGB  6.8 (LL) 08/06/2023 1038   HCT 22.3 (L) 08/06/2023 1038   PLT 59 (L) 08/06/2023 1038   MCV 89.9 08/06/2023 1038   NEUTROABS 1.0 (L) 08/06/2023 1038   LYMPHSABS 1.9 08/06/2023 1038   MONOABS 0.2 08/06/2023 1038   EOSABS 0.0 08/06/2023 1038   BASOSABS 0.0 08/06/2023 1038   Comprehensive Metabolic Panel:    Component Value Date/Time   NA 134 (L) 08/06/2023 1038   K 3.7 08/06/2023 1038   CL 99 08/06/2023 1038   CO2 27 08/06/2023 1038   BUN 17 08/06/2023 1038   CREATININE 1.66 (H) 08/06/2023 1038   CREATININE 1.69 (H) 07/12/2021 1516   GLUCOSE 118 (H) 08/06/2023 1038   CALCIUM 7.9 (L) 08/06/2023 1038   AST 16 08/06/2023 1038  ALT 8 08/06/2023 1038   ALKPHOS 70 08/06/2023 1038   BILITOT 0.9 08/06/2023 1038   PROT 7.3 08/06/2023 1038   ALBUMIN  2.0 (L) 08/06/2023 1038    RADIOGRAPHIC STUDIES: No results found.  PERFORMANCE STATUS (ECOG) : 2 - Symptomatic, <50% confined to bed  Review of Systems Unless otherwise noted, a complete review of systems is negative.  Physical Exam General: NAD Cardiovascular: regular rate and rhythm Pulmonary: clear ant fields Abdomen: soft, nontender, + bowel sounds GU: no suprapubic tenderness Extremities: no edema, no joint deformities Skin: Sacral ulcer reported but not visualized Neurological: Weakness but otherwise nonfocal  IMPRESSION: Patient with AML not currently on treatment.  Has had 2 cycles of decitabine , both resulting in hospitalization.  Previous plan was for patient to transfer back to SNF with hospice.  However, he opted to continue transfusions.  Patient is now receiving weekly blood transfusions.  He follows up today for repeat labs.  Hemoglobin today is 6.8.  Patient states that he continues to desire ongoing transfusions.  Will proceed with blood transfusion tomorrow.  Of note, patient is frailer appearing today.  He says that he continues to work with OT at the SNF and feels he is doing okay.  He denies any acute  symptomatic complaints or concerns.  I did attempt to call his wife but was unable to reach her.  PLAN: - Continue current scope of treatment - Transfusion tomorrow and then follow-up labs/possible transfusion next week - Recommend palliative care following at SNF transition to hospice if evidence of decline  Case and plan discussed with Dr. Randy Buttery   Patient expressed understanding and was in agreement with this plan. He also understands that He can call the clinic at any time with any questions, concerns, or complaints.     Time Total: 15 minutes  Visit consisted of counseling and education dealing with the complex and emotionally intense issues of symptom management and palliative care in the setting of serious and potentially life-threatening illness.Greater than 50%  of this time was spent counseling and coordinating care related to the above assessment and plan.  Signed by: Gerilyn Kobus, PhD, NP-C

## 2023-08-07 ENCOUNTER — Inpatient Hospital Stay

## 2023-08-07 DIAGNOSIS — C9200 Acute myeloblastic leukemia, not having achieved remission: Secondary | ICD-10-CM | POA: Diagnosis not present

## 2023-08-07 DIAGNOSIS — D649 Anemia, unspecified: Secondary | ICD-10-CM

## 2023-08-07 MED ORDER — ACETAMINOPHEN 325 MG PO TABS
650.0000 mg | ORAL_TABLET | Freq: Once | ORAL | Status: AC
  Administered 2023-08-07: 650 mg via ORAL

## 2023-08-07 MED ORDER — DIPHENHYDRAMINE HCL 25 MG PO CAPS
25.0000 mg | ORAL_CAPSULE | Freq: Once | ORAL | Status: AC
  Administered 2023-08-07: 25 mg via ORAL

## 2023-08-07 MED ORDER — SODIUM CHLORIDE 0.9% IV SOLUTION
250.0000 mL | INTRAVENOUS | Status: DC
  Administered 2023-08-07: 100 mL via INTRAVENOUS
  Filled 2023-08-07: qty 250

## 2023-08-08 ENCOUNTER — Emergency Department

## 2023-08-08 ENCOUNTER — Other Ambulatory Visit: Payer: Self-pay

## 2023-08-08 ENCOUNTER — Encounter: Payer: Self-pay | Admitting: Internal Medicine

## 2023-08-08 ENCOUNTER — Inpatient Hospital Stay
Admission: EM | Admit: 2023-08-08 | Discharge: 2023-08-10 | DRG: 689 | Disposition: A | Discharge status: Skilled Nursing Facility | Source: Skilled Nursing Facility | Attending: Internal Medicine | Admitting: Internal Medicine

## 2023-08-08 ENCOUNTER — Telehealth: Payer: Self-pay

## 2023-08-08 DIAGNOSIS — I129 Hypertensive chronic kidney disease with stage 1 through stage 4 chronic kidney disease, or unspecified chronic kidney disease: Secondary | ICD-10-CM | POA: Diagnosis present

## 2023-08-08 DIAGNOSIS — I10 Essential (primary) hypertension: Secondary | ICD-10-CM | POA: Diagnosis present

## 2023-08-08 DIAGNOSIS — C9200 Acute myeloblastic leukemia, not having achieved remission: Secondary | ICD-10-CM | POA: Diagnosis not present

## 2023-08-08 DIAGNOSIS — E43 Unspecified severe protein-calorie malnutrition: Secondary | ICD-10-CM

## 2023-08-08 DIAGNOSIS — D638 Anemia in other chronic diseases classified elsewhere: Secondary | ICD-10-CM | POA: Diagnosis present

## 2023-08-08 DIAGNOSIS — Z1621 Resistance to vancomycin: Secondary | ICD-10-CM | POA: Diagnosis present

## 2023-08-08 DIAGNOSIS — N3001 Acute cystitis with hematuria: Principal | ICD-10-CM | POA: Diagnosis present

## 2023-08-08 DIAGNOSIS — E663 Overweight: Secondary | ICD-10-CM

## 2023-08-08 DIAGNOSIS — R220 Localized swelling, mass and lump, head: Secondary | ICD-10-CM

## 2023-08-08 DIAGNOSIS — E1122 Type 2 diabetes mellitus with diabetic chronic kidney disease: Secondary | ICD-10-CM | POA: Diagnosis present

## 2023-08-08 DIAGNOSIS — Z823 Family history of stroke: Secondary | ICD-10-CM

## 2023-08-08 DIAGNOSIS — Z9221 Personal history of antineoplastic chemotherapy: Secondary | ICD-10-CM

## 2023-08-08 DIAGNOSIS — B952 Enterococcus as the cause of diseases classified elsewhere: Secondary | ICD-10-CM | POA: Diagnosis present

## 2023-08-08 DIAGNOSIS — D631 Anemia in chronic kidney disease: Secondary | ICD-10-CM | POA: Diagnosis present

## 2023-08-08 DIAGNOSIS — E1151 Type 2 diabetes mellitus with diabetic peripheral angiopathy without gangrene: Secondary | ICD-10-CM | POA: Diagnosis present

## 2023-08-08 DIAGNOSIS — Z66 Do not resuscitate: Secondary | ICD-10-CM | POA: Diagnosis present

## 2023-08-08 DIAGNOSIS — D630 Anemia in neoplastic disease: Secondary | ICD-10-CM | POA: Diagnosis present

## 2023-08-08 DIAGNOSIS — G9340 Encephalopathy, unspecified: Principal | ICD-10-CM

## 2023-08-08 DIAGNOSIS — Z8249 Family history of ischemic heart disease and other diseases of the circulatory system: Secondary | ICD-10-CM

## 2023-08-08 DIAGNOSIS — N1832 Chronic kidney disease, stage 3b: Secondary | ICD-10-CM | POA: Diagnosis present

## 2023-08-08 DIAGNOSIS — Z515 Encounter for palliative care: Secondary | ICD-10-CM

## 2023-08-08 DIAGNOSIS — Z79899 Other long term (current) drug therapy: Secondary | ICD-10-CM

## 2023-08-08 DIAGNOSIS — G9341 Metabolic encephalopathy: Secondary | ICD-10-CM | POA: Diagnosis present

## 2023-08-08 DIAGNOSIS — Z85038 Personal history of other malignant neoplasm of large intestine: Secondary | ICD-10-CM

## 2023-08-08 DIAGNOSIS — Z6826 Body mass index (BMI) 26.0-26.9, adult: Secondary | ICD-10-CM

## 2023-08-08 DIAGNOSIS — E785 Hyperlipidemia, unspecified: Secondary | ICD-10-CM | POA: Diagnosis present

## 2023-08-08 LAB — URINALYSIS, ROUTINE W REFLEX MICROSCOPIC
Bilirubin Urine: NEGATIVE
Glucose, UA: NEGATIVE mg/dL
Ketones, ur: NEGATIVE mg/dL
Nitrite: NEGATIVE
Protein, ur: 30 mg/dL — AB
Specific Gravity, Urine: 1.013 (ref 1.005–1.030)
pH: 5 (ref 5.0–8.0)

## 2023-08-08 LAB — COMPREHENSIVE METABOLIC PANEL WITH GFR
ALT: 8 U/L (ref 0–44)
AST: 14 U/L — ABNORMAL LOW (ref 15–41)
Albumin: 1.8 g/dL — ABNORMAL LOW (ref 3.5–5.0)
Alkaline Phosphatase: 58 U/L (ref 38–126)
Anion gap: 8 (ref 5–15)
BUN: 17 mg/dL (ref 8–23)
CO2: 27 mmol/L (ref 22–32)
Calcium: 8.3 mg/dL — ABNORMAL LOW (ref 8.9–10.3)
Chloride: 105 mmol/L (ref 98–111)
Creatinine, Ser: 1.7 mg/dL — ABNORMAL HIGH (ref 0.61–1.24)
GFR, Estimated: 41 mL/min — ABNORMAL LOW (ref >60)
Glucose, Bld: 98 mg/dL (ref 70–99)
Potassium: 3.7 mmol/L (ref 3.5–5.1)
Sodium: 140 mmol/L (ref 135–145)
Total Bilirubin: 1 mg/dL (ref 0.0–1.2)
Total Protein: 6.7 g/dL (ref 6.5–8.1)

## 2023-08-08 LAB — BPAM RBC
Blood Product Expiration Date: 202507092359
ISSUE DATE / TIME: 202506171032
Unit Type and Rh: 6200

## 2023-08-08 LAB — CBC
HCT: 23.6 % — ABNORMAL LOW (ref 39.0–52.0)
Hemoglobin: 7.3 g/dL — ABNORMAL LOW (ref 13.0–17.0)
MCH: 28.2 pg (ref 26.0–34.0)
MCHC: 30.9 g/dL (ref 30.0–36.0)
MCV: 91.1 fL (ref 80.0–100.0)
Platelets: 56 10*3/uL — ABNORMAL LOW (ref 150–400)
RBC: 2.59 MIL/uL — ABNORMAL LOW (ref 4.22–5.81)
RDW: 16.9 % — ABNORMAL HIGH (ref 11.5–15.5)
WBC: 4 10*3/uL (ref 4.0–10.5)
nRBC: 1.3 % — ABNORMAL HIGH (ref 0.0–0.2)

## 2023-08-08 LAB — TYPE AND SCREEN
ABO/RH(D): AB POS
Antibody Screen: NEGATIVE
Unit division: 0

## 2023-08-08 LAB — PREPARE RBC (CROSSMATCH)

## 2023-08-08 MED ORDER — ONDANSETRON HCL 4 MG PO TABS: 4.0000 mg | ORAL_TABLET | Freq: Four times a day (QID) | ORAL | Status: DC | PRN

## 2023-08-08 MED ORDER — PIPERACILLIN-TAZOBACTAM 3.375 G IVPB 30 MIN
3.3750 g | Freq: Once | INTRAVENOUS | Status: AC
  Administered 2023-08-08: 3.375 g via INTRAVENOUS
  Filled 2023-08-08 (×2): qty 50

## 2023-08-08 MED ORDER — SODIUM CHLORIDE 0.9 % IV SOLN
Freq: Once | INTRAVENOUS | Status: DC
  Filled 2023-08-08: qty 150

## 2023-08-08 MED ORDER — IOHEXOL 300 MG/ML  SOLN: 75.0000 mL | Freq: Once | INTRAMUSCULAR | Status: DC | PRN

## 2023-08-08 MED ORDER — ACETAMINOPHEN 650 MG RE SUPP: 650.0000 mg | Freq: Four times a day (QID) | RECTAL | Status: DC | PRN

## 2023-08-08 MED ORDER — PROCHLORPERAZINE MALEATE 10 MG PO TABS: 10.0000 mg | ORAL_TABLET | Freq: Four times a day (QID) | ORAL | Status: DC | PRN

## 2023-08-08 MED ORDER — LATANOPROST 0.005 % OP SOLN
1.0000 [drp] | Freq: Every day | OPHTHALMIC | Status: DC
  Administered 2023-08-09: 1 [drp] via OPHTHALMIC
  Filled 2023-08-08 (×2): qty 2.5

## 2023-08-08 MED ORDER — IOHEXOL 300 MG/ML  SOLN
50.0000 mL | Freq: Once | INTRAMUSCULAR | Status: AC | PRN
  Administered 2023-08-08: 50 mL via INTRAVENOUS

## 2023-08-08 MED ORDER — MIRTAZAPINE 15 MG PO TABS
15.0000 mg | ORAL_TABLET | Freq: Every day | ORAL | Status: DC
  Administered 2023-08-08 – 2023-08-10 (×3): 15 mg via ORAL
  Filled 2023-08-08 (×3): qty 1

## 2023-08-08 MED ORDER — ACETAMINOPHEN 325 MG PO TABS
650.0000 mg | ORAL_TABLET | Freq: Once | ORAL | Status: AC
  Administered 2023-08-08: 650 mg via ORAL
  Filled 2023-08-08: qty 2

## 2023-08-08 MED ORDER — ACETAMINOPHEN 325 MG PO TABS: 650.0000 mg | ORAL_TABLET | Freq: Four times a day (QID) | ORAL | Status: DC | PRN

## 2023-08-08 MED ORDER — PIPERACILLIN-TAZOBACTAM 3.375 G IVPB
3.3750 g | Freq: Three times a day (TID) | INTRAVENOUS | Status: DC
  Administered 2023-08-08 – 2023-08-10 (×5): 3.375 g via INTRAVENOUS
  Filled 2023-08-08 (×5): qty 50

## 2023-08-08 MED ORDER — MELATONIN 5 MG PO TABS
5.0000 mg | ORAL_TABLET | Freq: Every day | ORAL | Status: DC
  Administered 2023-08-08 – 2023-08-09 (×2): 5 mg via ORAL
  Filled 2023-08-08 (×2): qty 1

## 2023-08-08 MED ORDER — DULOXETINE HCL 30 MG PO CPEP
60.0000 mg | ORAL_CAPSULE | Freq: Two times a day (BID) | ORAL | Status: DC
  Administered 2023-08-08 – 2023-08-10 (×4): 60 mg via ORAL
  Filled 2023-08-08 (×4): qty 2

## 2023-08-08 MED ORDER — PANTOPRAZOLE SODIUM 40 MG PO TBEC
40.0000 mg | DELAYED_RELEASE_TABLET | Freq: Every day | ORAL | Status: DC
  Administered 2023-08-09 – 2023-08-10 (×2): 40 mg via ORAL
  Filled 2023-08-08 (×2): qty 1

## 2023-08-08 MED ORDER — ONDANSETRON HCL 4 MG/2ML IJ SOLN: 4.0000 mg | Freq: Four times a day (QID) | INTRAMUSCULAR | Status: DC | PRN

## 2023-08-08 MED ORDER — METOPROLOL SUCCINATE ER 50 MG PO TB24
50.0000 mg | ORAL_TABLET | Freq: Every day | ORAL | Status: DC
  Administered 2023-08-08 – 2023-08-10 (×3): 50 mg via ORAL
  Filled 2023-08-08 (×3): qty 1

## 2023-08-08 NOTE — ED Triage Notes (Signed)
 Pt here via [facility] with AMS. Pt alert and oriented to self and place but not time. Pt denies falls or pain but endorses bilateral ear aches. Pt also denies cp or sob.

## 2023-08-08 NOTE — ED Notes (Signed)
 Phlebotomy at bedside collecting second set of blood cultures. First set collected prior to abx administration.

## 2023-08-08 NOTE — ED Provider Notes (Signed)
 Patient resting without obvious distress.  CT Head Wo Contrast Result Date: 08/08/2023 CLINICAL DATA:  Mental status change, unknown cause; Sublingual/submandibular abscess left facial swelling EXAM: CT HEAD WITHOUT CONTRAST CT MAXILLOFACIAL WITHOUT CONTRAST TECHNIQUE: Multidetector CT imaging of the head and maxillofacial structures were performed using the standard protocol without intravenous contrast. Multiplanar CT image reconstructions of the maxillofacial structures were also generated. RADIATION DOSE REDUCTION: This exam was performed according to the departmental dose-optimization program which includes automated exposure control, adjustment of the mA and/or kV according to patient size and/or use of iterative reconstruction technique. COMPARISON:  CT head 04/03/2023 FINDINGS: CT HEAD FINDINGS Brain: Patchy and confluent areas of decreased attenuation are noted throughout the deep and periventricular white matter of the cerebral hemispheres bilaterally, compatible with chronic microvascular ischemic disease. No evidence of large-territorial acute infarction. No parenchymal hemorrhage. No mass lesion. No extra-axial collection. No mass effect or midline shift. No hydrocephalus. Basilar cisterns are patent. Vascular: No hyperdense vessel. Atherosclerotic calcifications are present within the cavernous internal carotid and vertebral arteries. Skull: No acute fracture or focal lesion. Other: None. CT MAXILLOFACIAL FINDINGS Osseous: No fracture or mandibular dislocation. No destructive process. Sinuses/Orbits: Complete right mastoid air cell effusion with fluid within the right middle ear. Partial left mastoid air cell effusion with fluid within the left middle ear. Paranasal sinuses are clear. Bilateral lens replacement. Otherwise the orbits are unremarkable. Soft tissues: Negative. IMPRESSION: 1. No acute intracranial abnormality. 2.  No acute displaced facial fracture. 3. Complete right mastoid air cell  effusion with fluid within the right middle ear. Partial left mastoid air cell effusion with fluid within the left middle ear. Recommend correlation with physical exam for infection. In a Electronically Signed   By: Morgane  Naveau M.D.   On: 08/08/2023 16:22   CT Maxillofacial W Contrast Result Date: 08/08/2023 CLINICAL DATA:  Mental status change, unknown cause; Sublingual/submandibular abscess left facial swelling EXAM: CT HEAD WITHOUT CONTRAST CT MAXILLOFACIAL WITHOUT CONTRAST TECHNIQUE: Multidetector CT imaging of the head and maxillofacial structures were performed using the standard protocol without intravenous contrast. Multiplanar CT image reconstructions of the maxillofacial structures were also generated. RADIATION DOSE REDUCTION: This exam was performed according to the departmental dose-optimization program which includes automated exposure control, adjustment of the mA and/or kV according to patient size and/or use of iterative reconstruction technique. COMPARISON:  CT head 04/03/2023 FINDINGS: CT HEAD FINDINGS Brain: Patchy and confluent areas of decreased attenuation are noted throughout the deep and periventricular white matter of the cerebral hemispheres bilaterally, compatible with chronic microvascular ischemic disease. No evidence of large-territorial acute infarction. No parenchymal hemorrhage. No mass lesion. No extra-axial collection. No mass effect or midline shift. No hydrocephalus. Basilar cisterns are patent. Vascular: No hyperdense vessel. Atherosclerotic calcifications are present within the cavernous internal carotid and vertebral arteries. Skull: No acute fracture or focal lesion. Other: None. CT MAXILLOFACIAL FINDINGS Osseous: No fracture or mandibular dislocation. No destructive process. Sinuses/Orbits: Complete right mastoid air cell effusion with fluid within the right middle ear. Partial left mastoid air cell effusion with fluid within the left middle ear. Paranasal sinuses  are clear. Bilateral lens replacement. Otherwise the orbits are unremarkable. Soft tissues: Negative. IMPRESSION: 1. No acute intracranial abnormality. 2.  No acute displaced facial fracture. 3. Complete right mastoid air cell effusion with fluid within the right middle ear. Partial left mastoid air cell effusion with fluid within the left middle ear. Recommend correlation with physical exam for infection. In a Electronically Signed   By: Morgane  Miachel Adams M.D.   On: 08/08/2023 16:22   DG Chest Port 1 View Result Date: 08/08/2023 CLINICAL DATA:  shortness of breath EXAM: PORTABLE CHEST 1 VIEW COMPARISON:  Chest x-ray 06/07/2023, chest x-ray 03/17/2023, chest x-ray 04/03/2023 FINDINGS: Patient is rotated. Prominent cardiomediastinal silhouette likely due to AP portable technique. The heart and mediastinal contours are grossly unchanged given patient rotation. No focal consolidation. Chronic coarsened interstitial markings with question pulmonary edema. Bilateral at least trace pleural effusions. No pneumothorax. No acute osseous abnormality. IMPRESSION: 1. Bilateral at least trace pleural effusions. 2. Chronic coarsened interstitial markings with question pulmonary edema. 3. Limited evaluation due to patient rotation. Consider chest x-ray PA and lateral repeat. Electronically Signed   By: Morgane  Naveau M.D.   On: 08/08/2023 15:59   Will start on coverage for antibiotics for urinary tract infection, and also possibility of mastoid or facial cellulitis including initiation of Zosyn.  Patient resting without distress.  Consulted with hospitalist Dr. Lee Public, MD 08/08/23 1700

## 2023-08-08 NOTE — ED Triage Notes (Signed)
 First nurse note: Patient arrived by [facility] from [facility]. Has swelling to left side of face. Infusion given yesterday for colon cancer. Staff reports lethargic since infusion. EMS reports patient follows commands  EMS vitals: 158/85 b/p 70HR 96% RA 111 CBG 15RR 97.6oral

## 2023-08-08 NOTE — Telephone Encounter (Signed)
 Notified nurse at [facility] a different patient's med list was sent instead of Robert [name] med list. Requested to fax over correct med list. Fax received & meds updated

## 2023-08-08 NOTE — Assessment & Plan Note (Signed)
 Ordered DNR status.

## 2023-08-08 NOTE — Assessment & Plan Note (Addendum)
 Not in remission.  Palliative care and oncology recommended no further chemo treatments.  Palliative care consultation appreciated.  Patient is a DO NOT RESUSCITATE.  Overall prognosis is poor since patient is transfusion dependent.  Wife amenable to meeting with hospice at [facility].

## 2023-08-08 NOTE — Assessment & Plan Note (Signed)
 Patient is transfusion dependent with AML.  Will give 1 unit of blood irradiated and look a depleted.

## 2023-08-08 NOTE — H&P (Addendum)
 History and Physical    Patient: Robert [name] FMW:[id number] DOB: 02-15-1946 DOA: 08/08/2023 DOS: the patient was seen and examined on 08/09/2023 PCP: Britta King, MD  Patient coming from: SNF  Chief Complaint:  Chief Complaint  Patient presents with   Altered Mental Status   HPI: Robert [name] is a 78 y.o. male with medical history significant of AML requiring transfusions, hyperlipidemia, CKD, hypertension, peripheral vascular disease, type 2 diabetes without complication.  Patient was recently seen at the cancer center seen by palliative care and received a transfusion.  No further chemotherapy offered.  Patient is a DO NOT RESUSCITATE.  Patient was sent in for altered mental status.  He did not know where he is which is a change from his usual.  He has been battling a urinary tract infection and looks like he has been on Cipro as outpatient.  Patient able to answer some questions but not back to his baseline.  Patient's urinalysis is positive and patient placed on IV Zosyn  in the ER. Review of Systems: Review of Systems  Constitutional:  Positive for malaise/fatigue. Negative for chills, fever and weight loss.  HENT:  Negative for sore throat.   Eyes:  Negative for blurred vision.  Respiratory:  Negative for cough.   Cardiovascular:  Negative for chest pain.  Gastrointestinal:  Positive for diarrhea. Negative for abdominal pain, blood in stool, constipation, nausea and vomiting.  Genitourinary:  Negative for dysuria.  Musculoskeletal:  Negative for myalgias.  Skin:  Negative for rash.  Neurological:  Negative for dizziness.  Endo/Heme/Allergies:  Bruises/bleeds easily.  Psychiatric/Behavioral:  Negative for depression.     Past Medical History:  Diagnosis Date   Allergy    Cellulitis    Diabetes mellitus without complication (HCC)    Hyperlipidemia    Hypertension    Leukemia (HCC)    Peripheral vascular disease [facility])    Past Surgical History:  Procedure  Laterality Date   BIOPSY  08/21/2022   Procedure: BIOPSY;  Surgeon: Onita Elspeth Sharper, DO;  Location: [facility] ENDOSCOPY;  Service: Gastroenterology;;   COLONOSCOPY WITH PROPOFOL  N/A 10/02/2017   Procedure: COLONOSCOPY WITH PROPOFOL ;  Surgeon: Therisa Bi, MD;  Location: [facility] ENDOSCOPY;  Service: Gastroenterology;  Laterality: N/A;   COLONOSCOPY WITH PROPOFOL  N/A 08/21/2022   Procedure: COLONOSCOPY WITH PROPOFOL ;  Surgeon: Onita Elspeth Sharper, DO;  Location: [facility] ENDOSCOPY;  Service: Gastroenterology;  Laterality: N/A;   ESOPHAGOGASTRODUODENOSCOPY (EGD) WITH PROPOFOL  N/A 08/11/2022   Procedure: ESOPHAGOGASTRODUODENOSCOPY (EGD) WITH PROPOFOL ;  Surgeon: Jinny Carmine, MD;  Location: [facility] ENDOSCOPY;  Service: Endoscopy;  Laterality: N/A;   HERNIA REPAIR     IR BONE MARROW BIOPSY & ASPIRATION  04/10/2023   PERICARDIOCENTESIS N/A 08/29/2022   Procedure: PERICARDIOCENTESIS;  Surgeon: Mady Bruckner, MD;  Location: [facility] INVASIVE CV LAB;  Service: Cardiovascular;  Laterality: N/A;   POLYPECTOMY  08/21/2022   Procedure: POLYPECTOMY;  Surgeon: Onita Elspeth Sharper, DO;  Location: [facility] ENDOSCOPY;  Service: Gastroenterology;;   Social History:  reports that he has never smoked. He has never used smokeless tobacco. He reports that he does not currently use alcohol. He reports that he does not use drugs.  No Known Allergies  Family History  Problem Relation Age of Onset   Heart disease Mother    Stroke Mother    Heart disease Father    Heart attack Father     Prior to Admission medications   Medication Sig Start Date End Date Taking? Authorizing Provider  acetaminophen  (TYLENOL ) 650 MG CR  tablet Take 650 mg by mouth every 6 (six) hours as needed for pain (Headache).    [provider]  BD PEN NEEDLE NANO 2ND GEN 32G X 4 MM MISC USE 1 PEN NEEDLE EVERY DAY AS DIRECTED 01/21/21   Masoud, Javed, MD  ciprofloxacin (CIPRO) 250 MG tablet Take 250 mg by mouth 2 (two) times daily.    [provider]  DULoxetine  (CYMBALTA ) 60 MG capsule Take 1 capsule (60 mg total) by mouth 2 (two) times daily. 04/12/23   Alexander, Natalie, DO  ferrous sulfate  325 (65 FE) MG tablet Take 1 tablet (325 mg total) by mouth 2 (two) times daily with a meal for 14 days. Patient not taking: Reported on 08/08/2023 04/12/23 08/08/23  Alexander, Natalie, DO  hydrOXYzine  (ATARAX ) 25 MG tablet Take 1 tablet (25 mg total) by mouth 3 (three) times daily as needed for itching. Patient not taking: Reported on 08/08/2023 04/12/23   Alexander, Natalie, DO  latanoprost  (XALATAN ) 0.005 % ophthalmic solution Place 1 drop into both eyes at bedtime. 04/12/23   Alexander, Natalie, DO  melatonin 5 MG TABS Take 1 tablet (5 mg total) by mouth at bedtime. 04/12/23   Alexander, Natalie, DO  metoprolol  succinate (TOPROL -XL) 50 MG 24 hr tablet Take 1 tablet (50 mg total) by mouth daily. Take with or immediately following a meal. 04/12/23   Alexander, Natalie, DO  mirtazapine  (REMERON ) 15 MG tablet Take 1 tablet (15 mg total) by mouth daily. 04/12/23   Alexander, Natalie, DO  omeprazole  (PRILOSEC) 20 MG capsule Take 20 mg by mouth 2 (two) times daily.    [provider]  ondansetron  (ZOFRAN ) 4 MG tablet Take 1 tablet (4 mg total) by mouth every 6 (six) hours as needed for nausea. 04/12/23   Alexander, Natalie, DO  ondansetron  (ZOFRAN ) 8 MG tablet Take 1 tablet (8 mg total) by mouth every 8 (eight) hours as needed for nausea or vomiting. 05/01/23   Dasie Tinnie MATSU, NP  oxybutynin  (DITROPAN -XL) 10 MG 24 hr tablet Take 1 tablet (10 mg total) by mouth every evening. 04/12/23   Alexander, Natalie, DO  polyethylene glycol powder (GLYCOLAX /MIRALAX ) 17 GM/SCOOP powder Take 17 g by mouth daily. 04/12/23   Alexander, Natalie, DO  prochlorperazine  (COMPAZINE ) 10 MG tablet Take 1 tablet (10 mg total) by mouth every 6 (six) hours as needed for nausea or vomiting. 05/01/23   Dasie Tinnie MATSU, NP  senna-docusate (FT STOOL SOFTENER) 8.6-50 MG tablet Take 2 tablets  by mouth daily as needed for mild constipation. 04/12/23   Alexander, Natalie, DO  sodium phosphate (FLEET) ENEM Place 133 mLs (1 enema total) rectally daily as needed for severe constipation. 04/12/23   Marsa Edelman, DO    Physical Exam: Vitals:   08/08/23 2316 08/08/23 2330 08/09/23 0136 08/09/23 0426  BP: (!) 146/75 133/71 (!) 156/84 (!) 162/87  Pulse: 67 64 68 68  Resp: 18 17 17 18   Temp: 97.7 F (36.5 C) 98.4 F (36.9 C) 98.1 F (36.7 C) 98.3 F (36.8 C)  TempSrc: Oral Oral Oral   SpO2: 100% 100% 100% 97%  Weight:      Height:       Physical Exam HENT:     Head: Normocephalic.   Eyes:     General: Lids are normal.     Comments: Pale conjunctiva   Cardiovascular:     Rate and Rhythm: Normal rate and regular rhythm.     Heart sounds: Murmur heard.     Systolic  murmur is present with a grade of 2/6.  Pulmonary:     Breath sounds: No decreased breath sounds, wheezing, rhonchi or rales.  Abdominal:     Palpations: Abdomen is soft.     Tenderness: There is no abdominal tenderness.   Musculoskeletal:     Right lower leg: No swelling.     Left lower leg: No swelling.   Skin:    General: Skin is warm.     Findings: No rash.   Neurological:     Mental Status: He is alert.     Comments: Answers some simple questions.    Data Reviewed: Creatinine 1.7, hemoglobin 7.3, platelet count 56 CT scan of the head showed no acute intracranial abnormality, no acute displaced facial fracture, complete right mastoid air cell effusion with fluid within the right middle ear  Assessment and Plan: * Acute metabolic encephalopathy This is a change from his baseline mental status.  He did not know where he was at the facility.  Normally he is oriented to person place and time (I have seen him on prior admissions).  Sent in for further evaluation.  Likely precipitated by urinary tract infection.  Continue antibiotics.  Acute cystitis with hematuria ER physician ordered Zosyn .   Last time he had a urinary tract infection did have resistance profile.  May need to keep in in the hospital until we get sensitivities.  AML (acute myeloblastic leukemia) (HCC) Not in remission.  Palliative care and oncology recommended no further chemo treatments.  Will get palliative care consultation.  Patient is a DO NOT RESUSCITATE.  Anemia of chronic disease Patient is transfusion dependent with AML.  Will give 1 unit of blood irradiated and look a depleted.  Essential hypertension On Toprol   DNR (do not resuscitate) Ordered DNR status.  Overweight (BMI 25.0-29.9) BMI 27.86 with current height and weight in computer.  CKD stage 3b, GFR 30-44 ml/min (HCC) Continue to monitor      Advance Care Planning:   Code Status: Limited: Do not attempt resuscitation (DNR) -DNR-LIMITED -Do Not Intubate/DNI    Consults: Palliative care  Family Communication: Updated wife on the phone  Severity of Illness: The appropriate patient status for this patient is INPATIENT. Inpatient status is judged to be reasonable and necessary in order to provide the required intensity of service to ensure the patient's safety. The patient's presenting symptoms, physical exam findings, and initial radiographic and laboratory data in the context of their chronic comorbidities is felt to place them at high risk for further clinical deterioration. Furthermore, it is not anticipated that the patient will be medically stable for discharge from the hospital within 2 midnights of admission.   * I certify that at the point of admission it is my clinical judgment that the patient will require inpatient hospital care spanning beyond 2 midnights from the point of admission due to high intensity of service, high risk for further deterioration and high frequency of surveillance required.*  Author: Charlie Patterson, MD 08/09/2023 7:15 AM  For on call review [email or web address].

## 2023-08-08 NOTE — Assessment & Plan Note (Signed)
 On Toprol

## 2023-08-08 NOTE — ED Provider Notes (Signed)
 [facility] Provider Note    Event Date/Time   First MD Initiated Contact with Patient 08/08/23 1130     (approximate)   History   Altered Mental Status   HPI  Robert [name] is a 78 year old male with history of AML presenting to the ER for evaluation of altered mental status.  Patient presents  from [facility] with reported altered mental status.  Noted to have left facial swelling today.  No reported fevers.  Patient tells me he was sent here for shortness of breath.  He denies pain anywhere.  Denies feeling confused.  Denies falls.  I reviewed his oncology note from 6/16.  Patient had been tried on chemotherapy x 2 both times resulting in hospitalization.  There was consideration for transition to hospice, but patient expressed a desire to continue blood transfusions.  I do note transfusion performed yesterday.       Physical Exam   Triage Vital Signs: ED Triage Vitals  Encounter Vitals Group     BP 08/08/23 1053 (!) 145/78     Girls Systolic BP Percentile --      Girls Diastolic BP Percentile --      Boys Systolic BP Percentile --      Boys Diastolic BP Percentile --      Pulse Rate 08/08/23 1053 72     Resp 08/08/23 1053 18     Temp 08/08/23 1053 98.6 F (37 C)     Temp Source 08/08/23 1053 Oral     SpO2 08/08/23 1053 98 %     Weight --      Height --      Head Circumference --      Peak Flow --      Pain Score 08/08/23 1056 0     Pain Loc --      Pain Education --      Exclude from Growth Chart --     Most recent vital signs: Vitals:   08/08/23 1230 08/08/23 1410  BP: (!) 158/94 (!) 154/112  Pulse: 81 82  Resp:  17  Temp:    SpO2: 98% 99%     General: Awake, interactive  HEENT: Swelling of the left face compared to the contralateral side, poor dentition noted without visible periapical abscess CV:  Regular rate, good peripheral perfusion.  Resp:  Unlabored respirations, lungs clear to  auscultation Abd:  Nondistended, soft, nontender Neuro:  Symmetric facial movement, fluid speech, oriented to self, place, some confusion about situation   ED Results / Procedures / Treatments   Labs (all labs ordered are listed, but only abnormal results are displayed) Labs Reviewed  COMPREHENSIVE METABOLIC PANEL WITH GFR - Abnormal; Notable for the following components:      Result Value   Creatinine, Ser 1.70 (*)    Calcium 8.3 (*)    Albumin  1.8 (*)    AST 14 (*)    GFR, Estimated 41 (*)    All other components within normal limits  CBC - Abnormal; Notable for the following components:   RBC 2.59 (*)    Hemoglobin 7.3 (*)    HCT 23.6 (*)    RDW 16.9 (*)    Platelets 56 (*)    nRBC 1.3 (*)    All other components within normal limits  URINALYSIS, ROUTINE W REFLEX MICROSCOPIC     EKG EKG independently reviewed and interpreted by myself demonstrates:  EKG demonstrates narrow complex at a rate of 88, QRS  90, QTc 368, there are P waves before most QRS complexes, but I do see additional P waves present without clear PR prolongation with consideration for possible heart block  RADIOLOGY Imaging independently reviewed and interpreted by myself demonstrates:  CT head pending CT max face pending Chest x-Mylinda Brook pending  Formal Radiology Read:  No results found.  PROCEDURES:  Critical Care performed: No  Procedures   MEDICATIONS ORDERED IN ED: Medications - No data to display   IMPRESSION / MDM / ASSESSMENT AND PLAN / ED COURSE  I reviewed the triage vital signs and the nursing notes.  Differential diagnosis includes, but is not limited to, intracranial bleed, electrolyte abnormality, pneumonia, dental infection, facial abscess, UTI  Patient's presentation is most consistent with acute presentation with potential threat to life or bodily function.  78 year old male presenting to the emergency department for evaluation of reported altered mental status.  Mild  confusion on exam, unclear baseline.  Will obtain labs, head CT, CT max face, x-Jemiah Cuadra to further evaluate.  EKG with regular rate, narrow complex, somewhat unclear rhythm with concern for possible heart block.  Cardiology paged to discuss further, specifically with concern for type II second-degree heart block though patient without clinical history of syncope.  Signed out to oncoming physician at 1500 pending discussion with consultants, completion of workup, reevaluation, and disposition.       FINAL CLINICAL IMPRESSION(S) / ED DIAGNOSES   Final diagnoses:  Acute encephalopathy  Facial swelling     Rx / DC Orders   ED Discharge Orders     None        Note:  This document was prepared using Dragon voice recognition software and may include unintentional dictation errors.   Claria Crofts, MD 08/08/23 [phone]

## 2023-08-08 NOTE — ED Notes (Signed)
 Fall precautions taken. Fall bracelet and non-slip socks applied to pt. Bed alarm on.

## 2023-08-08 NOTE — Assessment & Plan Note (Addendum)
 Patient admitted with change in his normal baseline mental status.  He did not know where he was at the facility.  Normally he is oriented to person place and time (I have seen him on prior admissions).  Sent in for further evaluation.  Likely precipitated by urinary tract infection.  Continue antibiotics.

## 2023-08-08 NOTE — Assessment & Plan Note (Addendum)
 ER physician ordered Zosyn  and I continued.  Vancomycin  resistant Enterococcus growing out of urine culture.  Zosyn  would cover.  Will switch to amoxicillin  for 6 more days.

## 2023-08-08 NOTE — Consult Note (Signed)
 ED Pharmacy Antibiotic Sign Off An antibiotic consult was received from an ED provider for Zosyn per pharmacy dosing for UTI/facial cellultis. A chart review was completed to assess appropriateness.   The following one time order(s) were placed:  Zosyn 3.375mg  IV.  Further antibiotic and/or antibiotic pharmacy consults should be ordered by the admitting provider if indicated.   Thank you for allowing pharmacy to be a part of this patient's care.   Braylie Badami A Soniyah Mcglory, [facility]  Clinical Pharmacist 08/08/23 5:06 PM

## 2023-08-08 NOTE — Assessment & Plan Note (Signed)
 Continue to monitor

## 2023-08-08 NOTE — Assessment & Plan Note (Addendum)
 BMI 27.86 with current height and weight in computer.

## 2023-08-09 DIAGNOSIS — G9341 Metabolic encephalopathy: Secondary | ICD-10-CM | POA: Diagnosis present

## 2023-08-09 DIAGNOSIS — E663 Overweight: Secondary | ICD-10-CM | POA: Diagnosis present

## 2023-08-09 DIAGNOSIS — D638 Anemia in other chronic diseases classified elsewhere: Secondary | ICD-10-CM | POA: Diagnosis not present

## 2023-08-09 DIAGNOSIS — Z9221 Personal history of antineoplastic chemotherapy: Secondary | ICD-10-CM | POA: Diagnosis not present

## 2023-08-09 DIAGNOSIS — C9200 Acute myeloblastic leukemia, not having achieved remission: Secondary | ICD-10-CM | POA: Diagnosis present

## 2023-08-09 DIAGNOSIS — Z79899 Other long term (current) drug therapy: Secondary | ICD-10-CM | POA: Diagnosis not present

## 2023-08-09 DIAGNOSIS — D631 Anemia in chronic kidney disease: Secondary | ICD-10-CM | POA: Diagnosis present

## 2023-08-09 DIAGNOSIS — Z515 Encounter for palliative care: Secondary | ICD-10-CM | POA: Diagnosis not present

## 2023-08-09 DIAGNOSIS — E785 Hyperlipidemia, unspecified: Secondary | ICD-10-CM | POA: Diagnosis present

## 2023-08-09 DIAGNOSIS — N1832 Chronic kidney disease, stage 3b: Secondary | ICD-10-CM | POA: Diagnosis present

## 2023-08-09 DIAGNOSIS — Z85038 Personal history of other malignant neoplasm of large intestine: Secondary | ICD-10-CM | POA: Diagnosis not present

## 2023-08-09 DIAGNOSIS — B952 Enterococcus as the cause of diseases classified elsewhere: Secondary | ICD-10-CM | POA: Diagnosis present

## 2023-08-09 DIAGNOSIS — Z8249 Family history of ischemic heart disease and other diseases of the circulatory system: Secondary | ICD-10-CM | POA: Diagnosis not present

## 2023-08-09 DIAGNOSIS — E1151 Type 2 diabetes mellitus with diabetic peripheral angiopathy without gangrene: Secondary | ICD-10-CM | POA: Diagnosis present

## 2023-08-09 DIAGNOSIS — D630 Anemia in neoplastic disease: Secondary | ICD-10-CM | POA: Diagnosis present

## 2023-08-09 DIAGNOSIS — Z6826 Body mass index (BMI) 26.0-26.9, adult: Secondary | ICD-10-CM | POA: Diagnosis not present

## 2023-08-09 DIAGNOSIS — N3001 Acute cystitis with hematuria: Secondary | ICD-10-CM | POA: Diagnosis present

## 2023-08-09 DIAGNOSIS — E1122 Type 2 diabetes mellitus with diabetic chronic kidney disease: Secondary | ICD-10-CM | POA: Diagnosis present

## 2023-08-09 DIAGNOSIS — Z823 Family history of stroke: Secondary | ICD-10-CM | POA: Diagnosis not present

## 2023-08-09 DIAGNOSIS — E43 Unspecified severe protein-calorie malnutrition: Secondary | ICD-10-CM | POA: Diagnosis present

## 2023-08-09 DIAGNOSIS — Z66 Do not resuscitate: Secondary | ICD-10-CM | POA: Diagnosis present

## 2023-08-09 DIAGNOSIS — I129 Hypertensive chronic kidney disease with stage 1 through stage 4 chronic kidney disease, or unspecified chronic kidney disease: Secondary | ICD-10-CM | POA: Diagnosis present

## 2023-08-09 DIAGNOSIS — Z1621 Resistance to vancomycin: Secondary | ICD-10-CM | POA: Diagnosis present

## 2023-08-09 LAB — BASIC METABOLIC PANEL WITH GFR
Anion gap: 7 (ref 5–15)
BUN: 18 mg/dL (ref 8–23)
CO2: 27 mmol/L (ref 22–32)
Calcium: 8.3 mg/dL — ABNORMAL LOW (ref 8.9–10.3)
Chloride: 105 mmol/L (ref 98–111)
Creatinine, Ser: 1.63 mg/dL — ABNORMAL HIGH (ref 0.61–1.24)
GFR, Estimated: 43 mL/min — ABNORMAL LOW (ref >60)
Glucose, Bld: 87 mg/dL (ref 70–99)
Potassium: 3.5 mmol/L (ref 3.5–5.1)
Sodium: 139 mmol/L (ref 135–145)

## 2023-08-09 LAB — CBC
HCT: 23.9 % — ABNORMAL LOW (ref 39.0–52.0)
Hemoglobin: 7.6 g/dL — ABNORMAL LOW (ref 13.0–17.0)
MCH: 28.3 pg (ref 26.0–34.0)
MCHC: 31.8 g/dL (ref 30.0–36.0)
MCV: 88.8 fL (ref 80.0–100.0)
Platelets: 54 10*3/uL — ABNORMAL LOW (ref 150–400)
RBC: 2.69 MIL/uL — ABNORMAL LOW (ref 4.22–5.81)
RDW: 16.7 % — ABNORMAL HIGH (ref 11.5–15.5)
WBC: 3.8 10*3/uL — ABNORMAL LOW (ref 4.0–10.5)
nRBC: 1.8 % — ABNORMAL HIGH (ref 0.0–0.2)

## 2023-08-09 LAB — TYPE AND SCREEN
ABO/RH(D): AB POS
Antibody Screen: NEGATIVE
Unit division: 0
Unit division: 0

## 2023-08-09 LAB — BPAM RBC
Blood Product Expiration Date: 202507042359
Blood Product Expiration Date: 202507122359
ISSUE DATE / TIME: 202506182307
Unit Type and Rh: 6200
Unit Type and Rh: 9500

## 2023-08-09 NOTE — Plan of Care (Signed)
 Pt's first night on unit.  Will assess and update plan of care as necessary.   Problem: Education: Goal: Knowledge of General Education information will improve Description: Including pain rating scale, medication(s)/side effects and non-pharmacologic comfort measures Outcome: Progressing   Problem: Health Behavior/Discharge Planning: Goal: Ability to manage health-related needs will improve Outcome: Progressing   Problem: Clinical Measurements: Goal: Ability to maintain clinical measurements within normal limits will improve Outcome: Progressing Goal: Will remain free from infection Outcome: Progressing Goal: Diagnostic test results will improve Outcome: Progressing Goal: Respiratory complications will improve Outcome: Progressing Goal: Cardiovascular complication will be avoided Outcome: Progressing   Problem: Activity: Goal: Risk for activity intolerance will decrease Outcome: Progressing   Problem: Nutrition: Goal: Adequate nutrition will be maintained Outcome: Progressing   Problem: Coping: Goal: Level of anxiety will decrease Outcome: Progressing   Problem: Elimination: Goal: Will not experience complications related to bowel motility Outcome: Progressing Goal: Will not experience complications related to urinary retention Outcome: Progressing   Problem: Pain Managment: Goal: General experience of comfort will improve and/or be controlled Outcome: Progressing   Problem: Safety: Goal: Ability to remain free from injury will improve Outcome: Progressing   Problem: Skin Integrity: Goal: Risk for impaired skin integrity will decrease Outcome: Progressing

## 2023-08-09 NOTE — Hospital Course (Addendum)
 78 y.o. male with medical history significant of AML requiring transfusions, hyperlipidemia, CKD, hypertension, peripheral vascular disease, type 2 diabetes without complication.  Patient was recently seen at the cancer center seen by palliative care and received a transfusion.  No further chemotherapy offered.  Patient is a DO NOT RESUSCITATE.  Patient was sent in for altered mental status.  He did not know where he is which is a change from his usual.  He has been battling a urinary tract infection and looks like he has been on Cipro as outpatient.  Patient able to answer some questions but not back to his baseline.  Patient's urinalysis is positive and patient placed on IV Zosyn in the ER.   6/19.  Patient did not sleep very well last night and does not feel too good today.  Started on antibiotics for urinary infection.  Patient able to answer questions.  Transfuse 1 unit of packed red blood cells with hemoglobin coming up to 7.6. 6/20.  Urine infection growing vancomycin -resistant Enterococcus.  Patient was on Zosyn which will cover this.  Will switch to Amoxil  for 6 more days.  Wife interested in speaking with hospice on her at facility.  Will discharge back to facility today.

## 2023-08-09 NOTE — Consult Note (Signed)
 Palliative Medicine [facility] at [facility] Telephone:([phone]) [phone] Fax:(336) [phone]   Name: Robert [name] Date: 08/09/2023 MRN: [id number]  DOB: 20-Apr-1945  Patient Care Team: Theron Flavin, MD as PCP - General (Internal Medicine) Avonne Boettcher, MD as Consulting Physician (Oncology)    REASON FOR CONSULTATION: Robert [name] is a 78 y.o. male with multiple medical problems including AML not currently in remission. Patient has been hospitalized after each trial of decitabine . Further chemotherapy was not recommended. Hospice and comfort care were discussed but patient opted to continue transfusions.  Patient is now hospitalized with altered mental status from UTI.  Palliative care was consulted to address goals and manage ongoing symptoms. .   SOCIAL HISTORY:     reports that he has never smoked. He has never used smokeless tobacco. He reports that he does not currently use alcohol. He reports that he does not use drugs.  Patient is married.  He is a resident at [facility].  ADVANCE DIRECTIVES:  Not on file  CODE STATUS: DNR  PAST MEDICAL HISTORY: Past Medical History:  Diagnosis Date   Allergy    Cellulitis    Diabetes mellitus without complication (HCC)    Hyperlipidemia    Hypertension    Leukemia (HCC)    Peripheral vascular disease (HCC)     PAST SURGICAL HISTORY:  Past Surgical History:  Procedure Laterality Date   BIOPSY  08/21/2022   Procedure: BIOPSY;  Surgeon: Quintin Buckle, DO;  Location: [facility] ENDOSCOPY;  Service: Gastroenterology;;   COLONOSCOPY WITH PROPOFOL  N/A 10/02/2017   Procedure: COLONOSCOPY WITH PROPOFOL ;  Surgeon: Luke Salaam, MD;  Location: [facility] ENDOSCOPY;  Service: Gastroenterology;  Laterality: N/A;   COLONOSCOPY WITH PROPOFOL  N/A 08/21/2022   Procedure: COLONOSCOPY WITH PROPOFOL ;  Surgeon: Quintin Buckle, DO;  Location: [facility] ENDOSCOPY;  Service: Gastroenterology;  Laterality: N/A;    ESOPHAGOGASTRODUODENOSCOPY (EGD) WITH PROPOFOL  N/A 08/11/2022   Procedure: ESOPHAGOGASTRODUODENOSCOPY (EGD) WITH PROPOFOL ;  Surgeon: Marnee Sink, MD;  Location: [facility] ENDOSCOPY;  Service: Endoscopy;  Laterality: N/A;   HERNIA REPAIR     IR BONE MARROW BIOPSY & ASPIRATION  04/10/2023   PERICARDIOCENTESIS N/A 08/29/2022   Procedure: PERICARDIOCENTESIS;  Surgeon: Sammy Crisp, MD;  Location: [facility] INVASIVE CV LAB;  Service: Cardiovascular;  Laterality: N/A;   POLYPECTOMY  08/21/2022   Procedure: POLYPECTOMY;  Surgeon: Quintin Buckle, DO;  Location: [facility] ENDOSCOPY;  Service: Gastroenterology;;    HEMATOLOGY/ONCOLOGY HISTORY:  Oncology History  Malignant neoplasm of sigmoid colon (HCC)  08/25/2022 Initial Diagnosis   Malignant neoplasm of sigmoid colon (HCC)   03/21/2023 Cancer Staging   Staging form: Colon and Rectum, AJCC 8th Edition - Pathologic stage from 03/21/2023: Stage I (pT1, pN0, cM0) - Signed by Avonne Boettcher, MD on 03/21/2023 Stage prefix: Initial diagnosis Total positive nodes: 0   AML (acute myeloblastic leukemia) (HCC)  05/01/2023 Initial Diagnosis   AML (acute myeloblastic leukemia) (HCC)   05/09/2023 -  Chemotherapy   Patient is on Treatment Plan : LEUKEMIA AML Decitabine  weekly x 4  q28d       ALLERGIES:  has no known allergies.  MEDICATIONS:  Current Facility-Administered Medications  Medication Dose Route Frequency Provider Last Rate Last Admin   0.9 %  sodium chloride  infusion   Intravenous Once Verla Glaze, MD   Held at 08/08/23 1751   acetaminophen  (TYLENOL ) tablet 650 mg  650 mg Oral Q6H PRN Verla Glaze, MD       Or  acetaminophen  (TYLENOL ) suppository 650 mg  650 mg Rectal Q6H PRN Verla Glaze, MD       DULoxetine  (CYMBALTA ) DR capsule 60 mg  60 mg Oral BID Verla Glaze, MD   60 mg at 08/09/23 0914   latanoprost  (XALATAN ) 0.005 % ophthalmic solution 1 drop  1 drop Both Eyes QHS Wieting, Richard, MD       melatonin tablet 5 mg  5 mg Oral  QHS Verla Glaze, MD   5 mg at 08/08/23 2254   metoprolol  succinate (TOPROL -XL) 24 hr tablet 50 mg  50 mg Oral Daily Verla Glaze, MD   50 mg at 08/09/23 0913   mirtazapine  (REMERON ) tablet 15 mg  15 mg Oral Daily Verla Glaze, MD   15 mg at 08/09/23 0914   ondansetron  (ZOFRAN ) tablet 4 mg  4 mg Oral Q6H PRN Verla Glaze, MD       Or   ondansetron  (ZOFRAN ) injection 4 mg  4 mg Intravenous Q6H PRN Verla Glaze, MD       pantoprazole  (PROTONIX ) EC tablet 40 mg  40 mg Oral Daily Verla Glaze, MD   40 mg at 08/09/23 0914   piperacillin-tazobactam (ZOSYN) IVPB 3.375 g  3.375 g Intravenous Q8H Nazari, Walid A, RPH 12.5 mL/hr at 08/09/23 0543 3.375 g at 08/09/23 0543   prochlorperazine  (COMPAZINE ) tablet 10 mg  10 mg Oral Q6H PRN Verla Glaze, MD        VITAL SIGNS: BP (!) 184/99   Pulse 66   Temp 98.4 F (36.9 C)   Resp 15   Ht 6' 2 (1.88 m)   Wt 203 lb (92.1 kg)   SpO2 94%   BMI 26.06 kg/m  Filed Weights   08/08/23 2239  Weight: 203 lb (92.1 kg)    Estimated body mass index is 26.06 kg/m as calculated from the following:   Height as of this encounter: 6' 2 (1.88 m).   Weight as of this encounter: 203 lb (92.1 kg).  LABS: CBC:    Component Value Date/Time   WBC 3.8 (L) 08/09/2023 0525   HGB 7.6 (L) 08/09/2023 0525   HGB 6.8 (LL) 08/06/2023 1038   HCT 23.9 (L) 08/09/2023 0525   PLT 54 (L) 08/09/2023 0525   PLT 59 (L) 08/06/2023 1038   MCV 88.8 08/09/2023 0525   NEUTROABS 1.0 (L) 08/06/2023 1038   LYMPHSABS 1.9 08/06/2023 1038   MONOABS 0.2 08/06/2023 1038   EOSABS 0.0 08/06/2023 1038   BASOSABS 0.0 08/06/2023 1038   Comprehensive Metabolic Panel:    Component Value Date/Time   NA 139 08/09/2023 0525   K 3.5 08/09/2023 0525   CL 105 08/09/2023 0525   CO2 27 08/09/2023 0525   BUN 18 08/09/2023 0525   CREATININE 1.63 (H) 08/09/2023 0525   CREATININE 1.66 (H) 08/06/2023 1038   CREATININE 1.69 (H) 07/12/2021 1516   GLUCOSE 87 08/09/2023  0525   CALCIUM 8.3 (L) 08/09/2023 0525   AST 14 (L) 08/08/2023 1116   AST 16 08/06/2023 1038   ALT 8 08/08/2023 1116   ALT 8 08/06/2023 1038   ALKPHOS 58 08/08/2023 1116   BILITOT 1.0 08/08/2023 1116   BILITOT 0.9 08/06/2023 1038   PROT 6.7 08/08/2023 1116   ALBUMIN  1.8 (L) 08/08/2023 1116    RADIOGRAPHIC STUDIES: CT Head Wo Contrast Result Date: 08/08/2023 CLINICAL DATA:  Mental status change, unknown cause; Sublingual/submandibular abscess left facial swelling EXAM: CT HEAD WITHOUT CONTRAST CT MAXILLOFACIAL WITHOUT CONTRAST TECHNIQUE: Multidetector CT imaging of  the head and maxillofacial structures were performed using the standard protocol without intravenous contrast. Multiplanar CT image reconstructions of the maxillofacial structures were also generated. RADIATION DOSE REDUCTION: This exam was performed according to the departmental dose-optimization program which includes automated exposure control, adjustment of the mA and/or kV according to patient size and/or use of iterative reconstruction technique. COMPARISON:  CT head 04/03/2023 FINDINGS: CT HEAD FINDINGS Brain: Patchy and confluent areas of decreased attenuation are noted throughout the deep and periventricular white matter of the cerebral hemispheres bilaterally, compatible with chronic microvascular ischemic disease. No evidence of large-territorial acute infarction. No parenchymal hemorrhage. No mass lesion. No extra-axial collection. No mass effect or midline shift. No hydrocephalus. Basilar cisterns are patent. Vascular: No hyperdense vessel. Atherosclerotic calcifications are present within the cavernous internal carotid and vertebral arteries. Skull: No acute fracture or focal lesion. Other: None. CT MAXILLOFACIAL FINDINGS Osseous: No fracture or mandibular dislocation. No destructive process. Sinuses/Orbits: Complete right mastoid air cell effusion with fluid within the right middle ear. Partial left mastoid air cell effusion  with fluid within the left middle ear. Paranasal sinuses are clear. Bilateral lens replacement. Otherwise the orbits are unremarkable. Soft tissues: Negative. IMPRESSION: 1. No acute intracranial abnormality. 2.  No acute displaced facial fracture. 3. Complete right mastoid air cell effusion with fluid within the right middle ear. Partial left mastoid air cell effusion with fluid within the left middle ear. Recommend correlation with physical exam for infection. In a Electronically Signed   By: Morgane  Naveau M.D.   On: 08/08/2023 16:22   CT Maxillofacial W Contrast Result Date: 08/08/2023 CLINICAL DATA:  Mental status change, unknown cause; Sublingual/submandibular abscess left facial swelling EXAM: CT HEAD WITHOUT CONTRAST CT MAXILLOFACIAL WITHOUT CONTRAST TECHNIQUE: Multidetector CT imaging of the head and maxillofacial structures were performed using the standard protocol without intravenous contrast. Multiplanar CT image reconstructions of the maxillofacial structures were also generated. RADIATION DOSE REDUCTION: This exam was performed according to the departmental dose-optimization program which includes automated exposure control, adjustment of the mA and/or kV according to patient size and/or use of iterative reconstruction technique. COMPARISON:  CT head 04/03/2023 FINDINGS: CT HEAD FINDINGS Brain: Patchy and confluent areas of decreased attenuation are noted throughout the deep and periventricular white matter of the cerebral hemispheres bilaterally, compatible with chronic microvascular ischemic disease. No evidence of large-territorial acute infarction. No parenchymal hemorrhage. No mass lesion. No extra-axial collection. No mass effect or midline shift. No hydrocephalus. Basilar cisterns are patent. Vascular: No hyperdense vessel. Atherosclerotic calcifications are present within the cavernous internal carotid and vertebral arteries. Skull: No acute fracture or focal lesion. Other: None. CT  MAXILLOFACIAL FINDINGS Osseous: No fracture or mandibular dislocation. No destructive process. Sinuses/Orbits: Complete right mastoid air cell effusion with fluid within the right middle ear. Partial left mastoid air cell effusion with fluid within the left middle ear. Paranasal sinuses are clear. Bilateral lens replacement. Otherwise the orbits are unremarkable. Soft tissues: Negative. IMPRESSION: 1. No acute intracranial abnormality. 2.  No acute displaced facial fracture. 3. Complete right mastoid air cell effusion with fluid within the right middle ear. Partial left mastoid air cell effusion with fluid within the left middle ear. Recommend correlation with physical exam for infection. In a Electronically Signed   By: Morgane  Naveau M.D.   On: 08/08/2023 16:22   DG Chest Port 1 View Result Date: 08/08/2023 CLINICAL DATA:  shortness of breath EXAM: PORTABLE CHEST 1 VIEW COMPARISON:  Chest x-ray 06/07/2023, chest x-ray 03/17/2023, chest x-ray 04/03/2023  FINDINGS: Patient is rotated. Prominent cardiomediastinal silhouette likely due to AP portable technique. The heart and mediastinal contours are grossly unchanged given patient rotation. No focal consolidation. Chronic coarsened interstitial markings with question pulmonary edema. Bilateral at least trace pleural effusions. No pneumothorax. No acute osseous abnormality. IMPRESSION: 1. Bilateral at least trace pleural effusions. 2. Chronic coarsened interstitial markings with question pulmonary edema. 3. Limited evaluation due to patient rotation. Consider chest x-ray PA and lateral repeat. Electronically Signed   By: Morgane  Naveau M.D.   On: 08/08/2023 15:59    PERFORMANCE STATUS (ECOG) : 3 - Symptomatic, >50% confined to bed  Review of Systems Unable to complete  Physical Exam General: Frail appearing Pulmonary: Unlabored Extremities: no edema, no joint deformities Skin: no rashes Neurological: Confused  IMPRESSION: Patient with AML not  currently on treatment.  He is transfusion dependent.  Patient now hospitalized with altered mental status.  Found to have UTI.  Patient remains confused.  He wakes with stimulation and answer simple questions but is oriented only to person.  At baseline, patient is generally alert and oriented.  Patient is not currently able to engage meaningfully in a conversation regarding goals.  Patient is a resident at [facility].  During the previous hospitalization, there was discussion and plan for possible hospice involvement.  However, after further consideration, patient opted to continue transfusions and therefore  hospice was not initiated.   I attempted to call patient's wife but was unsuccessful in reaching her.  PLAN: - Continue current scope of treatment - Patient is a DNR - Will continue to try to reach patient's wife to discuss goals   Time Total: 30 minutes  Visit consisted of counseling and education dealing with the complex and emotionally intense issues of symptom management and palliative care in the setting of serious and potentially life-threatening illness.Greater than 50%  of this time was spent counseling and coordinating care related to the above assessment and plan.  Signed by: Gerilyn Kobus, PhD, NP-C

## 2023-08-09 NOTE — Plan of Care (Addendum)
 Pt is alert x 2, denies any c/o pain. Pt receiving IV Zosyn for UTI. Pt turns in the bed and pillows being used. Pt is on contact for ESBL for history of positive culture per Infectious Disease. Pt has male purewick for incontinence. Pt has bruising noted scattered throughout bilateral arms. Bed ia low, locked with bed alarm on for safety.  Problem: Education: Goal: Knowledge of General Education information will improve Description: Including pain rating scale, medication(s)/side effects and non-pharmacologic comfort measures Outcome: Progressing   Problem: Health Behavior/Discharge Planning: Goal: Ability to manage health-related needs will improve Outcome: Progressing   Problem: Clinical Measurements: Goal: Ability to maintain clinical measurements within normal limits will improve Outcome: Progressing Goal: Will remain free from infection Outcome: Progressing Goal: Diagnostic test results will improve Outcome: Progressing Goal: Respiratory complications will improve Outcome: Progressing Goal: Cardiovascular complication will be avoided Outcome: Progressing   Problem: Activity: Goal: Risk for activity intolerance will decrease Outcome: Progressing   Problem: Nutrition: Goal: Adequate nutrition will be maintained Outcome: Progressing   Problem: Coping: Goal: Level of anxiety will decrease Outcome: Progressing   Problem: Elimination: Goal: Will not experience complications related to bowel motility Outcome: Progressing Goal: Will not experience complications related to urinary retention Outcome: Progressing   Problem: Pain Managment: Goal: General experience of comfort will improve and/or be controlled Outcome: Progressing   Problem: Safety: Goal: Ability to remain free from injury will improve Outcome: Progressing   Problem: Skin Integrity: Goal: Risk for impaired skin integrity will decrease Outcome: Progressing

## 2023-08-09 NOTE — Progress Notes (Signed)
 Progress Note   Patient: Robert [name] WUJ:[id number] DOB: 09/03/1945 DOA: 08/08/2023     0 DOS: the patient was seen and examined on 08/09/2023   Brief hospital course: 78 y.o. male with medical history significant of AML requiring transfusions, hyperlipidemia, CKD, hypertension, peripheral vascular disease, type 2 diabetes without complication.  Patient was recently seen at the cancer center seen by palliative care and received a transfusion.  No further chemotherapy offered.  Patient is a DO NOT RESUSCITATE.  Patient was sent in for altered mental status.  He did not know where he is which is a change from his usual.  He has been battling a urinary tract infection and looks like he has been on Cipro as outpatient.  Patient able to answer some questions but not back to his baseline.  Patient's urinalysis is positive and patient placed on IV Zosyn in the ER.   6/19.  Patient did not sleep very well last night and does not feel too good today.  Started on antibiotics for urinary infection.  Patient able to answer questions.  Transfuse 1 unit of packed red blood cells with hemoglobin coming up to 7.6.  Assessment and Plan: * Acute metabolic encephalopathy This is a change from his baseline mental status.  He did not know where he was at the facility.  Normally he is oriented to person place and time (I have seen him on prior admissions).  Sent in for further evaluation.  Likely precipitated by urinary tract infection.  Continue antibiotics.  Patient more lethargic today than yesterday likely since he had poor sleep last night.  Acute cystitis with hematuria ER physician ordered Zosyn and I continued.  Last time he had a urinary tract infection did have resistance profile.  Will keep in the hospital until I get sensitivities of this urine back.  AML (acute myeloblastic leukemia) (HCC) Not in remission.  Palliative care and oncology recommended no further chemo treatments.  Palliative care  consultation appreciated.  Patient is a DO NOT RESUSCITATE.  Overall prognosis is poor since patient is transfusion dependent.  Anemia of chronic disease Patient is transfusion dependent with AML.  Patient given 1 unit of packed red blood cells leukocyte depleted and irradiated on 6/18 for hemoglobin of 7.3.  Hemoglobin only came up to 7.6.  Overall prognosis is poor.  Essential hypertension On Toprol   CKD stage 3b, GFR 30-44 ml/min (HCC) Last creatinine 1.63 with a GFR of 43  Overweight (BMI 25.0-29.9) BMI 26.06 with current height and weight in computer.  DNR (do not resuscitate) Ordered DNR status.        Subjective: Patient not feeling well today.  Did not sleep very well last night.  Answers some simple questions but went back to sleep.  Physical Exam: Vitals:   08/09/23 0136 08/09/23 0426 08/09/23 0831 08/09/23 0913  BP: (!) 156/84 (!) 162/87 (!) 184/99 (!) 184/99  Pulse: 68 68 66 66  Resp: 17 18 15    Temp: 98.1 F (36.7 C) 98.3 F (36.8 C) 98.4 F (36.9 C)   TempSrc: Oral     SpO2: 100% 97% 94%   Weight:      Height:       Physical Exam HENT:     Head: Normocephalic.   Eyes:     General: Lids are normal.     Comments: Pale conjunctiva   Cardiovascular:     Rate and Rhythm: Normal rate and regular rhythm.     Heart sounds: Murmur heard.  Systolic murmur is present with a grade of 2/6.  Pulmonary:     Breath sounds: No decreased breath sounds, wheezing, rhonchi or rales.  Abdominal:     Palpations: Abdomen is soft.     Tenderness: There is no abdominal tenderness.   Musculoskeletal:     Right lower leg: No swelling.     Left lower leg: No swelling.   Skin:    General: Skin is warm.     Findings: No rash.   Neurological:     Mental Status: He is lethargic.     Comments: Answers some simple questions.    Data Reviewed: White blood cell count 3.8, hemoglobin 7.6, platelet count 5.4, creatinine 1.63  Family Communication: Wife at  bedside  Disposition: Status is: Observation Since the patient did have a resistant bacteria on the last urine infection we will wait to see what grows out of this urine culture and adjust antibiotics prior to discharge back to [facility].  Palliative care team to see today to discuss goals of care.  No longer candidate for treatment for his AML.  Transfusion dependent.  Planned Discharge Destination: Back to long-term care once stable    Time spent: 28 minutes  Author: Verla Glaze, MD 08/09/2023 1:48 PM  For on call review [email or web address].

## 2023-08-10 DIAGNOSIS — E43 Unspecified severe protein-calorie malnutrition: Secondary | ICD-10-CM

## 2023-08-10 DIAGNOSIS — Z515 Encounter for palliative care: Secondary | ICD-10-CM | POA: Diagnosis not present

## 2023-08-10 DIAGNOSIS — G9341 Metabolic encephalopathy: Secondary | ICD-10-CM | POA: Diagnosis not present

## 2023-08-10 DIAGNOSIS — N3001 Acute cystitis with hematuria: Secondary | ICD-10-CM | POA: Diagnosis not present

## 2023-08-10 DIAGNOSIS — D638 Anemia in other chronic diseases classified elsewhere: Secondary | ICD-10-CM | POA: Diagnosis not present

## 2023-08-10 DIAGNOSIS — C9200 Acute myeloblastic leukemia, not having achieved remission: Secondary | ICD-10-CM | POA: Diagnosis not present

## 2023-08-10 LAB — CULTURE, BLOOD (ROUTINE X 2)

## 2023-08-10 LAB — URINE CULTURE: Culture: >=100000 — AB

## 2023-08-10 LAB — CBC
HCT: 25.1 % — ABNORMAL LOW (ref 39.0–52.0)
Hemoglobin: 7.8 g/dL — ABNORMAL LOW (ref 13.0–17.0)
MCH: 27.8 pg (ref 26.0–34.0)
MCHC: 31.1 g/dL (ref 30.0–36.0)
MCV: 89.3 fL (ref 80.0–100.0)
Platelets: 49 10*3/uL — ABNORMAL LOW (ref 150–400)
RBC: 2.81 MIL/uL — ABNORMAL LOW (ref 4.22–5.81)
RDW: 16.7 % — ABNORMAL HIGH (ref 11.5–15.5)
WBC: 3.7 10*3/uL — ABNORMAL LOW (ref 4.0–10.5)
nRBC: 2.9 % — ABNORMAL HIGH (ref 0.0–0.2)

## 2023-08-10 MED ORDER — ADULT MULTIVITAMIN W/MINERALS CH: 1.0000 | ORAL_TABLET | Freq: Every day | ORAL | 0 refills | Status: DC

## 2023-08-10 MED ORDER — ENSURE PLUS HIGH PROTEIN PO LIQD: 237.0000 mL | Freq: Three times a day (TID) | ORAL | 0 refills | Status: DC

## 2023-08-10 MED ORDER — ADULT MULTIVITAMIN W/MINERALS CH
1.0000 | ORAL_TABLET | Freq: Every day | ORAL | Status: DC
  Administered 2023-08-10: 1
  Filled 2023-08-10: qty 1

## 2023-08-10 MED ORDER — ENSURE PLUS HIGH PROTEIN PO LIQD
237.0000 mL | Freq: Three times a day (TID) | ORAL | Status: DC
  Administered 2023-08-10 (×2): 237 mL via ORAL

## 2023-08-10 MED ORDER — AMOXICILLIN 500 MG PO CAPS
500.0000 mg | ORAL_CAPSULE | Freq: Three times a day (TID) | ORAL | Status: DC
  Administered 2023-08-10: 500 mg via ORAL
  Filled 2023-08-10 (×2): qty 1

## 2023-08-10 MED ORDER — POLYETHYLENE GLYCOL 3350 17 GM/SCOOP PO POWD
17.0000 g | Freq: Every day | ORAL | Status: DC | PRN
Start: 2023-08-10 — End: 2023-10-24

## 2023-08-10 MED ORDER — AMOXICILLIN 500 MG PO CAPS: 500.0000 mg | ORAL_CAPSULE | Freq: Three times a day (TID) | ORAL | 0 refills | Status: DC

## 2023-08-10 MED ORDER — ADULT MULTIVITAMIN W/MINERALS CH: 1.0000 | ORAL_TABLET | Freq: Every day | ORAL | Status: DC

## 2023-08-10 NOTE — Assessment & Plan Note (Signed)
 Ensure and multivitamin ordered

## 2023-08-10 NOTE — Discharge Summary (Signed)
 Physician Discharge Summary   Patient: Robert [name] MRN: [id number] DOB: 1945-10-01  Admit date:     08/08/2023  Discharge date: 08/10/23  Discharge Physician: Verla Glaze   PCP: Theron Flavin, MD   Recommendations at discharge:   Follow-up with physician at [facility] referral at [facility] If family does not signed up for hospice then would have to go back to the cancer center on a weekly basis to assess for transfusion.  Discharge Diagnoses: Principal Problem:   Acute metabolic encephalopathy Active Problems:   Acute cystitis with hematuria   Anemia of chronic disease   AML (acute myeloblastic leukemia) (HCC)   Essential hypertension   CKD stage 3b, GFR 30-44 ml/min (HCC)   Protein-calorie malnutrition, severe   Overweight (BMI 25.0-29.9)   DNR (do not resuscitate)   Palliative care encounter    Hospital Course: 78 y.o. male with medical history significant of AML requiring transfusions, hyperlipidemia, CKD, hypertension, peripheral vascular disease, type 2 diabetes without complication.  Patient was recently seen at the cancer center seen by palliative care and received a transfusion.  No further chemotherapy offered.  Patient is a DO NOT RESUSCITATE.  Patient was sent in for altered mental status.  He did not know where he is which is a change from his usual.  He has been battling a urinary tract infection and looks like he has been on Cipro as outpatient.  Patient able to answer some questions but not back to his baseline.  Patient's urinalysis is positive and patient placed on IV Zosyn in the ER.   6/19.  Patient did not sleep very well last night and does not feel too good today.  Started on antibiotics for urinary infection.  Patient able to answer questions.  Transfuse 1 unit of packed red blood cells with hemoglobin coming up to 7.6. 6/20.  Urine infection growing vancomycin -resistant Enterococcus.  Patient was on Zosyn which will cover this.   Will switch to Amoxil  for 6 more days.  Wife interested in speaking with hospice on her at facility.  Will discharge back to facility today.  Assessment and Plan: * Acute metabolic encephalopathy Patient admitted with change in his normal baseline mental status.  He did not know where he was at the facility.  Normally he is oriented to person place and time (I have seen him on prior admissions).  Sent in for further evaluation.  Likely precipitated by urinary tract infection.  Continue antibiotics.    Acute cystitis with hematuria ER physician ordered Zosyn and I continued.  Vancomycin  resistant Enterococcus growing out of urine culture.  Zosyn would cover.  Will switch to amoxicillin  for 6 more days.  AML (acute myeloblastic leukemia) (HCC) Not in remission.  Palliative care and oncology recommended no further chemo treatments.  Palliative care consultation appreciated.  Patient is a DO NOT RESUSCITATE.  Overall prognosis is poor since patient is transfusion dependent.  Wife amenable to meeting with hospice at [facility].  Anemia of chronic disease Patient is transfusion dependent with AML.  Patient given 1 unit of packed red blood cells leukocyte depleted and irradiated on 6/18 for hemoglobin of 7.3.  Last hemoglobin 7.8.  Overall prognosis is poor.  Essential hypertension On Toprol   CKD stage 3b, GFR 30-44 ml/min (HCC) Last creatinine 1.63 with a GFR of 43  Protein-calorie malnutrition, severe Ensure and multivitamin ordered  Overweight (BMI 25.0-29.9) BMI 26.06 with current height and weight in computer.  DNR (do not resuscitate) Ordered DNR  status.         Consultants: Palliative care Procedures performed: None Disposition: Long-term care. Diet recommendation:  Dysphagia 3 diet with thin liquids DISCHARGE MEDICATION: Allergies as of 08/10/2023   No Known Allergies      Medication List     STOP taking these medications    BD Pen Needle Nano 2nd Gen 32G X 4  MM Misc Generic drug: Insulin  Pen Needle   ciprofloxacin 250 MG tablet Commonly known as: CIPRO   FeroSul 325 (65 Fe) MG tablet Generic drug: ferrous sulfate    FT Stool Softener 50-8.6 MG tablet Generic drug: senna-docusate   hydrOXYzine  25 MG tablet Commonly known as: ATARAX    mirtazapine  15 MG tablet Commonly known as: REMERON    oxybutynin  10 MG 24 hr tablet Commonly known as: DITROPAN -XL   prochlorperazine  10 MG tablet Commonly known as: COMPAZINE        TAKE these medications    acetaminophen  650 MG CR tablet Commonly known as: TYLENOL  Take 650 mg by mouth every 6 (six) hours as needed for pain (Headache).   amoxicillin  500 MG capsule Commonly known as: AMOXIL  Take 1 capsule (500 mg total) by mouth every 8 (eight) hours for 6 days.   DULoxetine  60 MG capsule Commonly known as: Cymbalta  Take 1 capsule (60 mg total) by mouth 2 (two) times daily.   feeding supplement Liqd Take 237 mLs by mouth 3 (three) times daily between meals.   FT Enema Saline 7-19 GM/118ML Enem Place 133 mLs (1 enema total) rectally daily as needed for severe constipation.   hydroxypropyl methylcellulose / hypromellose 2.5 % ophthalmic solution Commonly known as: ISOPTO TEARS / GONIOVISC Place 1 drop into both eyes 3 (three) times daily.   latanoprost  0.005 % ophthalmic solution Commonly known as: XALATAN  Place 1 drop into both eyes at bedtime.   melatonin 5 MG Tabs Take 1 tablet (5 mg total) by mouth at bedtime.   metoprolol  succinate 50 MG 24 hr tablet Commonly known as: TOPROL -XL Take 1 tablet (50 mg total) by mouth daily. Take with or immediately following a meal.   multivitamin with minerals Tabs tablet Take 1 tablet by mouth daily. Start taking on: August 11, 2023   omeprazole  20 MG capsule Commonly known as: PRILOSEC Take 20 mg by mouth 2 (two) times daily.   ondansetron  8 MG tablet Commonly known as: Zofran  Take 1 tablet (8 mg total) by mouth every 8 (eight) hours as  needed for nausea or vomiting.   polyethylene glycol powder 17 GM/SCOOP powder Commonly known as: GLYCOLAX /MIRALAX  Take 17 g by mouth daily as needed for mild constipation. What changed:  when to take this reasons to take this        Contact information for follow-up providers     [facility] Follow up in 1 day(s).               Contact information for after-discharge care     Destination     [facility] .   Service: Skilled Nursing Contact information: [address] [place]  [phone]                    Discharge Exam: Robert [name] Weights   08/08/23 2239  Weight: 92.1 kg   Physical Exam HENT:     Head: Normocephalic.   Eyes:     General: Lids are normal.    Cardiovascular:     Rate and Rhythm:  Normal rate and regular rhythm.     Heart sounds: Normal heart sounds, S1 normal and S2 normal.  Pulmonary:     Breath sounds: No decreased breath sounds, wheezing, rhonchi or rales.  Abdominal:     Palpations: Abdomen is soft.     Tenderness: There is no abdominal tenderness.   Musculoskeletal:     Right lower leg: No swelling.     Left lower leg: No swelling.   Skin:    General: Skin is warm.     Findings: No rash.   Neurological:     Mental Status: He is alert.     Comments: Answers some questions.  Moves all of his extremities.     Condition at discharge: poor  The results of significant diagnostics from this hospitalization (including imaging, microbiology, ancillary and laboratory) are listed below for reference.   Imaging Studies: CT Head Wo Contrast Result Date: 08/08/2023 CLINICAL DATA:  Mental status change, unknown cause; Sublingual/submandibular abscess left facial swelling EXAM: CT HEAD WITHOUT CONTRAST CT MAXILLOFACIAL WITHOUT CONTRAST TECHNIQUE: Multidetector CT imaging of the head and maxillofacial structures were performed using the  standard protocol without intravenous contrast. Multiplanar CT image reconstructions of the maxillofacial structures were also generated. RADIATION DOSE REDUCTION: This exam was performed according to the departmental dose-optimization program which includes automated exposure control, adjustment of the mA and/or kV according to patient size and/or use of iterative reconstruction technique. COMPARISON:  CT head 04/03/2023 FINDINGS: CT HEAD FINDINGS Brain: Patchy and confluent areas of decreased attenuation are noted throughout the deep and periventricular white matter of the cerebral hemispheres bilaterally, compatible with chronic microvascular ischemic disease. No evidence of large-territorial acute infarction. No parenchymal hemorrhage. No mass lesion. No extra-axial collection. No mass effect or midline shift. No hydrocephalus. Basilar cisterns are patent. Vascular: No hyperdense vessel. Atherosclerotic calcifications are present within the cavernous internal carotid and vertebral arteries. Skull: No acute fracture or focal lesion. Other: None. CT MAXILLOFACIAL FINDINGS Osseous: No fracture or mandibular dislocation. No destructive process. Sinuses/Orbits: Complete right mastoid air cell effusion with fluid within the right middle ear. Partial left mastoid air cell effusion with fluid within the left middle ear. Paranasal sinuses are clear. Bilateral lens replacement. Otherwise the orbits are unremarkable. Soft tissues: Negative. IMPRESSION: 1. No acute intracranial abnormality. 2.  No acute displaced facial fracture. 3. Complete right mastoid air cell effusion with fluid within the right middle ear. Partial left mastoid air cell effusion with fluid within the left middle ear. Recommend correlation with physical exam for infection. In a Electronically Signed   By: Morgane  Naveau M.D.   On: 08/08/2023 16:22   CT Maxillofacial W Contrast Result Date: 08/08/2023 CLINICAL DATA:  Mental status change, unknown  cause; Sublingual/submandibular abscess left facial swelling EXAM: CT HEAD WITHOUT CONTRAST CT MAXILLOFACIAL WITHOUT CONTRAST TECHNIQUE: Multidetector CT imaging of the head and maxillofacial structures were performed using the standard protocol without intravenous contrast. Multiplanar CT image reconstructions of the maxillofacial structures were also generated. RADIATION DOSE REDUCTION: This exam was performed according to the departmental dose-optimization program which includes automated exposure control, adjustment of the mA and/or kV according to patient size and/or use of iterative reconstruction technique. COMPARISON:  CT head 04/03/2023 FINDINGS: CT HEAD FINDINGS Brain: Patchy and confluent areas of decreased attenuation are noted throughout the deep and periventricular white matter of the cerebral hemispheres bilaterally, compatible with chronic microvascular ischemic disease. No evidence of large-territorial acute infarction. No parenchymal hemorrhage. No mass lesion. No extra-axial collection. No  mass effect or midline shift. No hydrocephalus. Basilar cisterns are patent. Vascular: No hyperdense vessel. Atherosclerotic calcifications are present within the cavernous internal carotid and vertebral arteries. Skull: No acute fracture or focal lesion. Other: None. CT MAXILLOFACIAL FINDINGS Osseous: No fracture or mandibular dislocation. No destructive process. Sinuses/Orbits: Complete right mastoid air cell effusion with fluid within the right middle ear. Partial left mastoid air cell effusion with fluid within the left middle ear. Paranasal sinuses are clear. Bilateral lens replacement. Otherwise the orbits are unremarkable. Soft tissues: Negative. IMPRESSION: 1. No acute intracranial abnormality. 2.  No acute displaced facial fracture. 3. Complete right mastoid air cell effusion with fluid within the right middle ear. Partial left mastoid air cell effusion with fluid within the left middle ear. Recommend  correlation with physical exam for infection. In a Electronically Signed   By: Morgane  Naveau M.D.   On: 08/08/2023 16:22   DG Chest Port 1 View Result Date: 08/08/2023 CLINICAL DATA:  shortness of breath EXAM: PORTABLE CHEST 1 VIEW COMPARISON:  Chest x-ray 06/07/2023, chest x-ray 03/17/2023, chest x-ray 04/03/2023 FINDINGS: Patient is rotated. Prominent cardiomediastinal silhouette likely due to AP portable technique. The heart and mediastinal contours are grossly unchanged given patient rotation. No focal consolidation. Chronic coarsened interstitial markings with question pulmonary edema. Bilateral at least trace pleural effusions. No pneumothorax. No acute osseous abnormality. IMPRESSION: 1. Bilateral at least trace pleural effusions. 2. Chronic coarsened interstitial markings with question pulmonary edema. 3. Limited evaluation due to patient rotation. Consider chest x-ray PA and lateral repeat. Electronically Signed   By: Morgane  Naveau M.D.   On: 08/08/2023 15:59    Microbiology: Results for orders placed or performed during the hospital encounter of 08/08/23  Urine Culture     Status: Abnormal   Collection Time: 08/08/23  3:00 PM   Specimen: Urine, Clean Catch  Result Value Ref Range Status   Specimen Description   Final    URINE, CLEAN CATCH Performed at [facility], [address], [place], [place] [address]    Special Requests   Final    NONE Performed at [facility], [address] [place], [place] [address]    Culture (A)  Final    >=100,000 COLONIES/mL ENTEROCOCCUS FAECALIS VANCOMYCIN  RESISTANT ENTEROCOCCUS ISOLATED    Report Status 08/10/2023 FINAL  Final   Organism ID, Bacteria ENTEROCOCCUS FAECALIS (A)  Final      Susceptibility   Enterococcus faecalis - MIC*    AMPICILLIN <=2 SENSITIVE Sensitive     NITROFURANTOIN <=16 SENSITIVE Sensitive     VANCOMYCIN  >=32 RESISTANT Resistant     * >=100,000 COLONIES/mL ENTEROCOCCUS FAECALIS  Blood culture  (routine x 2)     Status: None (Preliminary result)   Collection Time: 08/08/23  5:39 PM   Specimen: BLOOD  Result Value Ref Range Status   Specimen Description BLOOD RIGHT ANTECUBITAL  Final   Special Requests   Final    BOTTLES DRAWN AEROBIC AND ANAEROBIC Blood Culture results may not be optimal due to an inadequate volume of blood received in culture bottles   Culture   Final    NO GROWTH 2 DAYS Performed at [facility], [address] [place], [place] [address]    Report Status PENDING  Incomplete  Blood culture (routine x 2)     Status: None (Preliminary result)   Collection Time: 08/08/23  6:02 PM   Specimen: BLOOD  Result Value Ref Range Status   Specimen Description BLOOD BLOOD LEFT  WRIST  Final   Special Requests   Final    BOTTLES DRAWN AEROBIC AND ANAEROBIC Blood Culture adequate volume   Culture   Final    NO GROWTH 2 DAYS Performed at [facility], [address] [place], [place] [address]    Report Status PENDING  Incomplete    Labs: CBC: Recent Labs  Lab 08/06/23 1038 08/08/23 1116 08/09/23 0525 08/10/23 0745  WBC 3.1* 4.0 3.8* 3.7*  NEUTROABS 1.0*  --   --   --   HGB 6.8* 7.3* 7.6* 7.8*  HCT 22.3* 23.6* 23.9* 25.1*  MCV 89.9 91.1 88.8 89.3  PLT 59* 56* 54* 49*   Basic Metabolic Panel: Recent Labs  Lab 08/06/23 1038 08/08/23 1116 08/09/23 0525  NA 134* 140 139  K 3.7 3.7 3.5  CL 99 105 105  CO2 27 27 27   GLUCOSE 118* 98 87  BUN 17 17 18   CREATININE 1.66* 1.70* 1.63*  CALCIUM 7.9* 8.3* 8.3*   Liver Function Tests: Recent Labs  Lab 08/06/23 1038 08/08/23 1116  AST 16 14*  ALT 8 8  ALKPHOS 70 58  BILITOT 0.9 1.0  PROT 7.3 6.7  ALBUMIN  2.0* 1.8*   CBG: No results for input(s): GLUCAP in the last 168 hours.  Discharge time spent: greater than 30 minutes.  Signed: Verla Glaze, MD [facility] 08/10/2023

## 2023-08-10 NOTE — TOC Transition Note (Signed)
 Transition of Care [facility]) - Discharge Note   Patient Details  Name: Robert [name] MRN: [id number] Date of Birth: August 23, 1945  [facility] CM/SW Contact:  Crayton Docker, RN 08/10/2023, 1:25 PM   Clinical Narrative:     CM call to Antony Baumgartner, Admissions, [facility] regarding patient returning to SNF today. No answer, CM left message for return call. Discharge orders noted, discharge summary noted. CM call to [facility], Admissions regarding BLS transport. Per Antony Baumgartner, 1500 is okay for transport.   CM call to [employer], phone: [phone] regarding BLS transport. CM spoke [place]. BLS transport scheduled for [facility] to [facility].   Final next level of care: Skilled Nursing Facility Barriers to Discharge: No Barriers Identified   Patient Goals and CMS Choice    SNF  Discharge Placement   SNF     Discharge Plan and Services Additional resources added to the After Visit Summary for    Social Drivers of Health (SDOH) Interventions SDOH Screenings   Food Insecurity: No Food Insecurity (08/08/2023)  Housing: Low Risk  (08/08/2023)  Transportation Needs: No Transportation Needs (08/08/2023)  Utilities: Not At Risk (08/08/2023)  Alcohol Screen: Low Risk  (10/29/2020)  Depression (PHQ2-9): Low Risk  (09/12/2021)  Financial Resource Strain: Low Risk  (12/15/2022)   Received from [facility]  Physical Activity: Inactive (10/29/2020)  Social Connections: Patient Declined (08/08/2023)  Stress: No Stress Concern Present (10/29/2020)  Tobacco Use: Low Risk  (08/08/2023)     Readmission Risk Interventions    04/04/2023   12:56 PM 02/07/2023   12:33 PM  Readmission Risk Prevention Plan  Transportation Screening Complete Complete  Medication Review (RN Care Manager) Complete Complete  PCP or Specialist appointment within 3-5 days of discharge Complete Complete  SW Recovery Care/Counseling Consult Complete Complete  Palliative Care  Screening Not Applicable Not Applicable  Skilled Nursing Facility Complete Complete

## 2023-08-10 NOTE — Plan of Care (Signed)

## 2023-08-10 NOTE — Progress Notes (Signed)
 Initial Nutrition Assessment  DOCUMENTATION CODES:   Severe malnutrition in context of chronic illness  INTERVENTION:   -Feeding assistance with meals -Magic cup TID with meals, each supplement provides 290 kcal and 9 grams of protein  -Ensure Plus High Protein po TID, each supplement provides 350 kcal and 20 grams of protein  -MVI with minerals daily -Continue dysphagia 3 diet with thin liquids  NUTRITION DIAGNOSIS:   Severe Malnutrition related to chronic illness (AML) as evidenced by moderate fat depletion, severe fat depletion, moderate muscle depletion, severe muscle depletion, percent weight loss.  GOAL:   Patient will meet greater than or equal to 90% of their needs  MONITOR:   PO intake, Supplement acceptance  REASON FOR ASSESSMENT:   Malnutrition Screening Tool    ASSESSMENT:   Pt with medical history significant of AML requiring transfusions, hyperlipidemia, CKD, hypertension, peripheral vascular disease, type 2 diabetes without complication.  Patient was recently seen at the cancer center seen by palliative care and received a transfusion.  No further chemotherapy offered.  Patient is a DO NOT RESUSCITATE.  Patient was sent in for altered mental status.  Pt admitted with acute metabolic encephalopathy.   Reviewed I/O's: -30 ml x 24 hours and +546 ml since admission  Spoke with pt at bedside, who was very lethargic at time of visit. Pt would arise briefly when name was called, but continued to fall back asleep during interview. No family at bedside. Pt reports he is in pain, sleepy, and has had a poor appetite, but unable to elaborate further despite probing.   Pt [facility].   Reviewed wt hx; pt has experienced a 7.6% wt loss over the past 2 months, which is significant for time frame.   Per palliative care notes (pt followed by palliative care at [facility] pt did not tolerate chemotherapy well and no further treatments are  being offered. Palliative care consult pending for goals of care.   Medications reviewed and include melatonin, remeron , and protonix .   Lab Results  Component Value Date   HGBA1C 5.0 04/03/2023   PTA DM medications are none.   Labs reviewed: Phos: 6.0, CBGS: 141 (inpatient orders for glycemic control are none).    NUTRITION - FOCUSED PHYSICAL EXAM:  Flowsheet Row Most Recent Value  Orbital Region Severe depletion  Upper Arm Region Severe depletion  Thoracic and Lumbar Region Moderate depletion  Buccal Region Severe depletion  Temple Region Severe depletion  Clavicle Bone Region Severe depletion  Clavicle and Acromion Bone Region Severe depletion  Scapular Bone Region Severe depletion  Dorsal Hand Moderate depletion  Patellar Region Moderate depletion  Anterior Thigh Region Moderate depletion  Posterior Calf Region Moderate depletion  Edema (RD Assessment) Mild  Hair Reviewed  Eyes Reviewed  Mouth Reviewed  Skin Reviewed  Nails Reviewed    Diet Order:   Diet Order             DIET DYS 3 Room service appropriate? Yes; Fluid consistency: Thin  Diet effective now                   EDUCATION NEEDS:   Not appropriate for education at this time  Skin:  Skin Assessment: Reviewed RN Assessment  Last BM:  08/09/23  Height:   Ht Readings from Last 1 Encounters:  08/08/23 6' 2 (1.88 m)    Weight:   Wt Readings from Last 1 Encounters:  08/08/23 92.1 kg    Ideal Body  Weight:  86.4 kg  BMI:  Body mass index is 26.06 kg/m.  Estimated Nutritional Needs:   Kcal:  2400-2600  Protein:  130-145 grams  Fluid:  2.0-2.2 L    Herschel Lords, RD, LDN, CDCES Registered Dietitian III Certified Diabetes Care and Education Specialist If unable to reach this RD, please use RD Inpatient group chat on secure chat between hours of 8am-4 pm daily
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
## 2023-08-10 NOTE — TOC Progression Note (Signed)
 Transition of Care [facility]) - Progression Note    Patient Details  Name: Robert [name] MRN: [id number] Date of Birth: 03/29/1945  [facility] CM/SW Contact  Crayton Docker, RN 08/10/2023, 12:35 PM  Clinical Narrative:     Noted, patient is from [facility], phone: [phone] regarding patient return. CM alert to Dr. Clelia Current regarding signing FL2. CM call to Antony Baumgartner, Admissions, [facility] SN[facility], regarding patient return today and coordination of BLS transport. No answer CM left message for return call.     Barriers to Discharge: Continued Medical Work up  Expected Discharge Plan and Services    SNF LTC    Expected Discharge Date: 08/10/23                  Social Determinants of Health (SDOH) Interventions SDOH Screenings   Food Insecurity: No Food Insecurity (08/08/2023)  Housing: Low Risk  (08/08/2023)  Transportation Needs: No Transportation Needs (08/08/2023)  Utilities: Not At Risk (08/08/2023)  Alcohol Screen: Low Risk  (10/29/2020)  Depression (PHQ2-9): Low Risk  (09/12/2021)  Financial Resource Strain: Low Risk  (12/15/2022)   Received from [facility]  Physical Activity: Inactive (10/29/2020)  Social Connections: Patient Declined (08/08/2023)  Stress: No Stress Concern Present (10/29/2020)  Tobacco Use: Low Risk  (08/08/2023)    Readmission Risk Interventions    04/04/2023   12:56 PM 02/07/2023   12:33 PM  Readmission Risk Prevention Plan  Transportation Screening Complete Complete  Medication Review (RN Care Manager) Complete Complete  PCP or Specialist appointment within 3-5 days of discharge Complete Complete  SW Recovery Care/Counseling Consult Complete Complete  Palliative Care Screening Not Applicable Not Applicable  Skilled Nursing Facility Complete Complete

## 2023-08-10 NOTE — Progress Notes (Signed)
 Palliative Medicine [facility] at [facility] [phone] [phone]   Name: Robert [name] Date: 08/10/2023 MRN: [id number]  DOB: October 28, 1945  Patient Care Team: Theron Flavin, MD as PCP - General (Internal Medicine) Avonne Boettcher, MD as Consulting Physician (Oncology)    REASON FOR CONSULTATION: Robert [name] is a 78 y.o. male with multiple medical problems including AML not currently in remission. Patient has been hospitalized after each trial of decitabine . Further chemotherapy was not recommended. Hospice and comfort care were discussed but patient opted to continue transfusions.  Patient is now hospitalized with altered mental status from UTI.  Palliative care was consulted to address goals and manage ongoing symptoms.   CODE STATUS: DNR  PAST MEDICAL HISTORY: Past Medical History:  Diagnosis Date   Allergy    Cellulitis    Diabetes mellitus without complication (HCC)    Hyperlipidemia    Hypertension    Leukemia (HCC)    Peripheral vascular disease (HCC)     PAST SURGICAL HISTORY:  Past Surgical History:  Procedure Laterality Date   BIOPSY  08/21/2022   Procedure: BIOPSY;  Surgeon: Quintin Buckle, DO;  Location[facility] ENDOSCOPY;  Service: Gastroenterology;;   COLONOSCOPY WITH PROPOFOL  N/A 10/02/2017   Procedure: COLONOSCOPY WITH PROPOFOL ;  Surgeon: Luke Salaam, MD;  Location: [facility] ENDOSCOPY;  Service: Gastroenterology;  Laterality: N/A;   COLONOSCOPY WITH PROPOFOL  N/A 08/21/2022   Procedure: COLONOSCOPY WITH PROPOFOL ;  Surgeon: Quintin Buckle, DO;  Location: [facility] ENDOSCOPY;  Service: Gastroenterology;  Laterality: N/A;   ESOPHAGOGASTRODUODENOSCOPY (EGD) WITH PROPOFOL  N/A 08/11/2022   Procedure: ESOPHAGOGASTRODUODENOSCOPY (EGD) WITH PROPOFOL ;  Surgeon: Marnee Sink, MD;  Location: [facility] ENDOSCOPY;  Service: Endoscopy;  Laterality: N/A;   HERNIA REPAIR     IR BONE MARROW BIOPSY & ASPIRATION  04/10/2023    PERICARDIOCENTESIS N/A 08/29/2022   Procedure: PERICARDIOCENTESIS;  Surgeon: Sammy Crisp, MD;  Location: [facility] INVASIVE CV LAB;  Service: Cardiovascular;  Laterality: N/A;   POLYPECTOMY  08/21/2022   Procedure: POLYPECTOMY;  Surgeon: Quintin Buckle, DO;  Location: [facility] ENDOSCOPY;  Service: Gastroenterology;;    HEMATOLOGY/ONCOLOGY HISTORY:  Oncology History  Malignant neoplasm of sigmoid colon (HCC)  08/25/2022 Initial Diagnosis   Malignant neoplasm of sigmoid colon (HCC)   03/21/2023 Cancer Staging   Staging form: Colon and Rectum, AJCC 8th Edition - Pathologic stage from 03/21/2023: Stage I (pT1, pN0, cM0) - Signed by Avonne Boettcher, MD on 03/21/2023 Stage prefix: Initial diagnosis Total positive nodes: 0   AML (acute myeloblastic leukemia) (HCC)  05/01/2023 Initial Diagnosis   AML (acute myeloblastic leukemia) (HCC)   05/09/2023 -  Chemotherapy   Patient is on Treatment Plan : LEUKEMIA AML Decitabine  weekly x 4  q28d       ALLERGIES:  has no known allergies.  MEDICATIONS:  Current Facility-Administered Medications  Medication Dose Route Frequency Provider Last Rate Last Admin   0.9 %  sodium chloride  infusion   Intravenous Once Wieting, Richard, MD   Held at 08/08/23 1751   acetaminophen  (TYLENOL ) tablet 650 mg  650 mg Oral Q6H PRN Verla Glaze, MD       Or   acetaminophen  (TYLENOL ) suppository 650 mg  650 mg Rectal Q6H PRN Wieting, Richard, MD       amoxicillin  (AMOXIL ) capsule 500 mg  500 mg Oral Q8H Wieting, Richard, MD       DULoxetine  (CYMBALTA ) DR capsule 60 mg  60 mg Oral BID Verla Glaze, MD  60 mg at 08/10/23 0954   feeding supplement (ENSURE PLUS HIGH PROTEIN) liquid 237 mL  237 mL Oral TID BM Verla Glaze, MD   237 mL at 08/10/23 0957   latanoprost  (XALATAN ) 0.005 % ophthalmic solution 1 drop  1 drop Both Eyes QHS Verla Glaze, MD   1 drop at 08/09/23 2228   melatonin tablet 5 mg  5 mg Oral QHS Verla Glaze, MD   5 mg at 08/09/23 2228    metoprolol  succinate (TOPROL -XL) 24 hr tablet 50 mg  50 mg Oral Daily Verla Glaze, MD   50 mg at 08/10/23 0955   mirtazapine  (REMERON ) tablet 15 mg  15 mg Oral Daily Verla Glaze, MD   15 mg at 08/10/23 0955   [START ON 08/11/2023] multivitamin with minerals tablet 1 tablet  1 tablet Oral Daily Madueme, Elvira C, RPH       ondansetron  (ZOFRAN ) tablet 4 mg  4 mg Oral Q6H PRN Verla Glaze, MD       Or   ondansetron  (ZOFRAN ) injection 4 mg  4 mg Intravenous Q6H PRN Verla Glaze, MD       pantoprazole  (PROTONIX ) EC tablet 40 mg  40 mg Oral Daily Verla Glaze, MD   40 mg at 08/10/23 0955   prochlorperazine  (COMPAZINE ) tablet 10 mg  10 mg Oral Q6H PRN Verla Glaze, MD        VITAL SIGNS: BP (!) 156/95 (BP Location: Right Arm)   Pulse 69   Temp 97.6 F (36.4 C) (Oral)   Resp 15   Ht 6' 2 (1.88 m)   Wt 203 lb (92.1 kg)   SpO2 94%   BMI 26.06 kg/m  Filed Weights   08/08/23 2239  Weight: 203 lb (92.1 kg)    Estimated body mass index is 26.06 kg/m as calculated from the following:   Height as of this encounter: 6' 2 (1.88 m).   Weight as of this encounter: 203 lb (92.1 kg).  LABS: CBC:    Component Value Date/Time   WBC 3.7 (L) 08/10/2023 0745   HGB 7.8 (L) 08/10/2023 0745   HGB 6.8 (LL) 08/06/2023 1038   HCT 25.1 (L) 08/10/2023 0745   PLT 49 (L) 08/10/2023 0745   PLT 59 (L) 08/06/2023 1038   MCV 89.3 08/10/2023 0745   NEUTROABS 1.0 (L) 08/06/2023 1038   LYMPHSABS 1.9 08/06/2023 1038   MONOABS 0.2 08/06/2023 1038   EOSABS 0.0 08/06/2023 1038   BASOSABS 0.0 08/06/2023 1038   Comprehensive Metabolic Panel:    Component Value Date/Time   NA 139 08/09/2023 0525   K 3.5 08/09/2023 0525   CL 105 08/09/2023 0525   CO2 27 08/09/2023 0525   BUN 18 08/09/2023 0525   CREATININE 1.63 (H) 08/09/2023 0525   CREATININE 1.66 (H) 08/06/2023 1038   CREATININE 1.69 (H) 07/12/2021 1516   GLUCOSE 87 08/09/2023 0525   CALCIUM 8.3 (L) 08/09/2023 0525   AST 14 (L)  08/08/2023 1116   AST 16 08/06/2023 1038   ALT 8 08/08/2023 1116   ALT 8 08/06/2023 1038   ALKPHOS 58 08/08/2023 1116   BILITOT 1.0 08/08/2023 1116   BILITOT 0.9 08/06/2023 1038   PROT 6.7 08/08/2023 1116   ALBUMIN  1.8 (L) 08/08/2023 1116    RADIOGRAPHIC STUDIES: CT Head Wo Contrast Result Date: 08/08/2023 CLINICAL DATA:  Mental status change, unknown cause; Sublingual/submandibular abscess left facial swelling EXAM: CT HEAD WITHOUT CONTRAST CT MAXILLOFACIAL WITHOUT CONTRAST TECHNIQUE: Multidetector CT imaging of the head and  maxillofacial structures were performed using the standard protocol without intravenous contrast. Multiplanar CT image reconstructions of the maxillofacial structures were also generated. RADIATION DOSE REDUCTION: This exam was performed according to the departmental dose-optimization program which includes automated exposure control, adjustment of the mA and/or kV according to patient size and/or use of iterative reconstruction technique. COMPARISON:  CT head 04/03/2023 FINDINGS: CT HEAD FINDINGS Brain: Patchy and confluent areas of decreased attenuation are noted throughout the deep and periventricular white matter of the cerebral hemispheres bilaterally, compatible with chronic microvascular ischemic disease. No evidence of large-territorial acute infarction. No parenchymal hemorrhage. No mass lesion. No extra-axial collection. No mass effect or midline shift. No hydrocephalus. Basilar cisterns are patent. Vascular: No hyperdense vessel. Atherosclerotic calcifications are present within the cavernous internal carotid and vertebral arteries. Skull: No acute fracture or focal lesion. Other: None. CT MAXILLOFACIAL FINDINGS Osseous: No fracture or mandibular dislocation. No destructive process. Sinuses/Orbits: Complete right mastoid air cell effusion with fluid within the right middle ear. Partial left mastoid air cell effusion with fluid within the left middle ear. Paranasal  sinuses are clear. Bilateral lens replacement. Otherwise the orbits are unremarkable. Soft tissues: Negative. IMPRESSION: 1. No acute intracranial abnormality. 2.  No acute displaced facial fracture. 3. Complete right mastoid air cell effusion with fluid within the right middle ear. Partial left mastoid air cell effusion with fluid within the left middle ear. Recommend correlation with physical exam for infection. In a Electronically Signed   By: Morgane  Naveau M.D.   On: 08/08/2023 16:22   CT Maxillofacial W Contrast Result Date: 08/08/2023 CLINICAL DATA:  Mental status change, unknown cause; Sublingual/submandibular abscess left facial swelling EXAM: CT HEAD WITHOUT CONTRAST CT MAXILLOFACIAL WITHOUT CONTRAST TECHNIQUE: Multidetector CT imaging of the head and maxillofacial structures were performed using the standard protocol without intravenous contrast. Multiplanar CT image reconstructions of the maxillofacial structures were also generated. RADIATION DOSE REDUCTION: This exam was performed according to the departmental dose-optimization program which includes automated exposure control, adjustment of the mA and/or kV according to patient size and/or use of iterative reconstruction technique. COMPARISON:  CT head 04/03/2023 FINDINGS: CT HEAD FINDINGS Brain: Patchy and confluent areas of decreased attenuation are noted throughout the deep and periventricular white matter of the cerebral hemispheres bilaterally, compatible with chronic microvascular ischemic disease. No evidence of large-territorial acute infarction. No parenchymal hemorrhage. No mass lesion. No extra-axial collection. No mass effect or midline shift. No hydrocephalus. Basilar cisterns are patent. Vascular: No hyperdense vessel. Atherosclerotic calcifications are present within the cavernous internal carotid and vertebral arteries. Skull: No acute fracture or focal lesion. Other: None. CT MAXILLOFACIAL FINDINGS Osseous: No fracture or  mandibular dislocation. No destructive process. Sinuses/Orbits: Complete right mastoid air cell effusion with fluid within the right middle ear. Partial left mastoid air cell effusion with fluid within the left middle ear. Paranasal sinuses are clear. Bilateral lens replacement. Otherwise the orbits are unremarkable. Soft tissues: Negative. IMPRESSION: 1. No acute intracranial abnormality. 2.  No acute displaced facial fracture. 3. Complete right mastoid air cell effusion with fluid within the right middle ear. Partial left mastoid air cell effusion with fluid within the left middle ear. Recommend correlation with physical exam for infection. In a Electronically Signed   By: Morgane  Naveau M.D.   On: 08/08/2023 16:22   DG Chest Port 1 View Result Date: 08/08/2023 CLINICAL DATA:  shortness of breath EXAM: PORTABLE CHEST 1 VIEW COMPARISON:  Chest x-ray 06/07/2023, chest x-ray 03/17/2023, chest x-ray 04/03/2023 FINDINGS: Patient is  rotated. Prominent cardiomediastinal silhouette likely due to AP portable technique. The heart and mediastinal contours are grossly unchanged given patient rotation. No focal consolidation. Chronic coarsened interstitial markings with question pulmonary edema. Bilateral at least trace pleural effusions. No pneumothorax. No acute osseous abnormality. IMPRESSION: 1. Bilateral at least trace pleural effusions. 2. Chronic coarsened interstitial markings with question pulmonary edema. 3. Limited evaluation due to patient rotation. Consider chest x-ray PA and lateral repeat. Electronically Signed   By: Morgane  Naveau M.D.   On: 08/08/2023 15:59    PERFORMANCE STATUS (ECOG) : 3 - Symptomatic, >50% confined to bed  Review of Systems Unless otherwise noted, a complete review of systems is negative.  Physical Exam General: Frail appearing Pulmonary: Unlabored Extremities: no edema, no joint deformities Skin: no rashes Neurological: Weakness but otherwise  nonfocal  IMPRESSION: Patient more alert and oriented today.  Still situationally confused.  Patient requested I call his wife.  I spoke with patient's wife.  She says that patient has been declining over the past several weeks.  At the SNF, he has mostly been sleeping and not getting out of bed as frequently.  Appetite has also remained poor.  Reportedly, patient has told her that he is tired of fighting.  We did again discuss the possible option of transitioning to comfort care and involving hospice at [facility].  This would require discontinuation of transfusions.  Wife does not feel that the transfusions are contributing to patient's wellness at this point.  She says that she plans to have additional conversations with patient regarding his goals and will let us  know what they decide.  Patient is scheduled for labs and transfusion next week and then follow-up MD visit in 2 weeks.   PLAN: - Continue current scope of treatment - Recommend palliative care following at SNF - Will plan outpatient clinic follow-up  Time Total: 25 minutes  Visit consisted of counseling and education dealing with the complex and emotionally intense issues of symptom management and palliative care in the setting of serious and potentially life-threatening illness.Greater than 50%  of this time was spent counseling and coordinating care related to the above assessment and plan.  Signed by: Gerilyn Kobus, PhD, NP-C

## 2023-08-13 ENCOUNTER — Telehealth: Payer: Self-pay

## 2023-08-13 ENCOUNTER — Inpatient Hospital Stay

## 2023-08-13 DIAGNOSIS — C9200 Acute myeloblastic leukemia, not having achieved remission: Secondary | ICD-10-CM

## 2023-08-13 DIAGNOSIS — D649 Anemia, unspecified: Secondary | ICD-10-CM

## 2023-08-13 LAB — CBC WITH DIFFERENTIAL (CANCER CENTER ONLY)
Abs Immature Granulocytes: 0.03 10*3/uL (ref 0.00–0.07)
Basophils Absolute: 0 10*3/uL (ref 0.0–0.1)
Basophils Relative: 1 %
Eosinophils Absolute: 0 10*3/uL (ref 0.0–0.5)
Eosinophils Relative: 0 %
HCT: 28.5 % — ABNORMAL LOW (ref 39.0–52.0)
Hemoglobin: 8.5 g/dL — ABNORMAL LOW (ref 13.0–17.0)
Immature Granulocytes: 1 %
Lymphocytes Relative: 54 %
Lymphs Abs: 1.7 10*3/uL (ref 0.7–4.0)
MCH: 27.4 pg (ref 26.0–34.0)
MCHC: 29.8 g/dL — ABNORMAL LOW (ref 30.0–36.0)
MCV: 91.9 fL (ref 80.0–100.0)
Monocytes Absolute: 0.3 10*3/uL (ref 0.1–1.0)
Monocytes Relative: 11 %
Neutro Abs: 1 10*3/uL — ABNORMAL LOW (ref 1.7–7.7)
Neutrophils Relative %: 33 %
Platelet Count: 47 10*3/uL — ABNORMAL LOW (ref 150–400)
RBC: 3.1 MIL/uL — ABNORMAL LOW (ref 4.22–5.81)
RDW: 17.2 % — ABNORMAL HIGH (ref 11.5–15.5)
WBC Count: 3.1 10*3/uL — ABNORMAL LOW (ref 4.0–10.5)
nRBC: 3.9 % — ABNORMAL HIGH (ref 0.0–0.2)

## 2023-08-13 LAB — CULTURE, BLOOD (ROUTINE X 2)
Culture: NO GROWTH
Culture: NO GROWTH
Special Requests: ADEQUATE

## 2023-08-13 LAB — SAMPLE TO BLOOD BANK

## 2023-08-13 NOTE — Telephone Encounter (Signed)
 Hemoglobin 8.5, recently transfused while admitted 08/08/23; per Dr. Melanee no transfusion.  Outbound call to patient;

## 2023-08-14 ENCOUNTER — Encounter: Payer: Self-pay | Admitting: Emergency Medicine

## 2023-08-14 ENCOUNTER — Other Ambulatory Visit: Payer: Self-pay

## 2023-08-14 ENCOUNTER — Emergency Department

## 2023-08-14 ENCOUNTER — Inpatient Hospital Stay

## 2023-08-14 ENCOUNTER — Inpatient Hospital Stay
Admission: EM | Admit: 2023-08-14 | Discharge: 2023-08-15 | DRG: 291 | Disposition: A | Discharge status: Skilled Nursing Facility | Source: Skilled Nursing Facility | Attending: Internal Medicine | Admitting: Internal Medicine

## 2023-08-14 DIAGNOSIS — Y95 Nosocomial condition: Secondary | ICD-10-CM | POA: Diagnosis present

## 2023-08-14 DIAGNOSIS — G9341 Metabolic encephalopathy: Secondary | ICD-10-CM | POA: Diagnosis present

## 2023-08-14 DIAGNOSIS — D630 Anemia in neoplastic disease: Secondary | ICD-10-CM | POA: Diagnosis present

## 2023-08-14 DIAGNOSIS — J189 Pneumonia, unspecified organism: Secondary | ICD-10-CM | POA: Diagnosis present

## 2023-08-14 DIAGNOSIS — Z860100 Personal history of colon polyps, unspecified: Secondary | ICD-10-CM | POA: Diagnosis not present

## 2023-08-14 DIAGNOSIS — I5023 Acute on chronic systolic (congestive) heart failure: Secondary | ICD-10-CM | POA: Diagnosis present

## 2023-08-14 DIAGNOSIS — C9200 Acute myeloblastic leukemia, not having achieved remission: Secondary | ICD-10-CM | POA: Diagnosis present

## 2023-08-14 DIAGNOSIS — R54 Age-related physical debility: Secondary | ICD-10-CM | POA: Diagnosis present

## 2023-08-14 DIAGNOSIS — Z1152 Encounter for screening for COVID-19: Secondary | ICD-10-CM

## 2023-08-14 DIAGNOSIS — Z79899 Other long term (current) drug therapy: Secondary | ICD-10-CM | POA: Diagnosis not present

## 2023-08-14 DIAGNOSIS — I130 Hypertensive heart and chronic kidney disease with heart failure and stage 1 through stage 4 chronic kidney disease, or unspecified chronic kidney disease: Secondary | ICD-10-CM | POA: Diagnosis present

## 2023-08-14 DIAGNOSIS — Z8249 Family history of ischemic heart disease and other diseases of the circulatory system: Secondary | ICD-10-CM | POA: Diagnosis not present

## 2023-08-14 DIAGNOSIS — D696 Thrombocytopenia, unspecified: Secondary | ICD-10-CM | POA: Diagnosis present

## 2023-08-14 DIAGNOSIS — N1831 Chronic kidney disease, stage 3a: Secondary | ICD-10-CM | POA: Diagnosis present

## 2023-08-14 DIAGNOSIS — Z515 Encounter for palliative care: Secondary | ICD-10-CM

## 2023-08-14 DIAGNOSIS — E1151 Type 2 diabetes mellitus with diabetic peripheral angiopathy without gangrene: Secondary | ICD-10-CM | POA: Diagnosis present

## 2023-08-14 DIAGNOSIS — E1122 Type 2 diabetes mellitus with diabetic chronic kidney disease: Secondary | ICD-10-CM | POA: Diagnosis present

## 2023-08-14 DIAGNOSIS — E785 Hyperlipidemia, unspecified: Secondary | ICD-10-CM | POA: Diagnosis present

## 2023-08-14 DIAGNOSIS — Z66 Do not resuscitate: Secondary | ICD-10-CM | POA: Diagnosis present

## 2023-08-14 DIAGNOSIS — J9601 Acute respiratory failure with hypoxia: Secondary | ICD-10-CM | POA: Diagnosis present

## 2023-08-14 DIAGNOSIS — R0902 Hypoxemia: Secondary | ICD-10-CM

## 2023-08-14 DIAGNOSIS — I10 Essential (primary) hypertension: Secondary | ICD-10-CM | POA: Diagnosis present

## 2023-08-14 DIAGNOSIS — Z823 Family history of stroke: Secondary | ICD-10-CM | POA: Diagnosis not present

## 2023-08-14 DIAGNOSIS — I509 Heart failure, unspecified: Secondary | ICD-10-CM

## 2023-08-14 DIAGNOSIS — Z8619 Personal history of other infectious and parasitic diseases: Secondary | ICD-10-CM

## 2023-08-14 DIAGNOSIS — E118 Type 2 diabetes mellitus with unspecified complications: Secondary | ICD-10-CM | POA: Diagnosis present

## 2023-08-14 DIAGNOSIS — R410 Disorientation, unspecified: Secondary | ICD-10-CM | POA: Diagnosis present

## 2023-08-14 LAB — CBC WITH DIFFERENTIAL/PLATELET
Abs Immature Granulocytes: 0.1 10*3/uL — ABNORMAL HIGH (ref 0.00–0.07)
Basophils Absolute: 0 10*3/uL (ref 0.0–0.1)
Basophils Relative: 1 %
Eosinophils Absolute: 0 10*3/uL (ref 0.0–0.5)
Eosinophils Relative: 0 %
HCT: 25.5 % — ABNORMAL LOW (ref 39.0–52.0)
Hemoglobin: 7.6 g/dL — ABNORMAL LOW (ref 13.0–17.0)
Lymphocytes Relative: 72 %
Lymphs Abs: 2.9 10*3/uL (ref 0.7–4.0)
MCH: 27.7 pg (ref 26.0–34.0)
MCHC: 29.8 g/dL — ABNORMAL LOW (ref 30.0–36.0)
MCV: 93.1 fL (ref 80.0–100.0)
Metamyelocytes Relative: 2 %
Monocytes Absolute: 0.3 10*3/uL (ref 0.1–1.0)
Monocytes Relative: 7 %
Neutro Abs: 0.7 10*3/uL — ABNORMAL LOW (ref 1.7–7.7)
Neutrophils Relative %: 18 %
Platelets: 41 10*3/uL — ABNORMAL LOW (ref 150–400)
RBC: 2.74 MIL/uL — ABNORMAL LOW (ref 4.22–5.81)
RDW: 17.6 % — ABNORMAL HIGH (ref 11.5–15.5)
WBC: 4 10*3/uL (ref 4.0–10.5)
nRBC: 1.3 % — ABNORMAL HIGH (ref 0.0–0.2)

## 2023-08-14 LAB — RESP PANEL BY RT-PCR (RSV, FLU A&B, COVID)  RVPGX2
Influenza A by PCR: NEGATIVE
Influenza B by PCR: NEGATIVE
Resp Syncytial Virus by PCR: NEGATIVE
SARS Coronavirus 2 by RT PCR: NEGATIVE

## 2023-08-14 LAB — COMPREHENSIVE METABOLIC PANEL WITH GFR
ALT: 9 U/L (ref 0–44)
AST: 18 U/L (ref 15–41)
Albumin: 1.9 g/dL — ABNORMAL LOW (ref 3.5–5.0)
Alkaline Phosphatase: 62 U/L (ref 38–126)
Anion gap: 10 (ref 5–15)
BUN: 23 mg/dL (ref 8–23)
CO2: 27 mmol/L (ref 22–32)
Calcium: 8.5 mg/dL — ABNORMAL LOW (ref 8.9–10.3)
Chloride: 102 mmol/L (ref 98–111)
Creatinine, Ser: 1.89 mg/dL — ABNORMAL HIGH (ref 0.61–1.24)
GFR, Estimated: 36 mL/min — ABNORMAL LOW (ref >60)
Glucose, Bld: 88 mg/dL (ref 70–99)
Potassium: 3.2 mmol/L — ABNORMAL LOW (ref 3.5–5.1)
Sodium: 139 mmol/L (ref 135–145)
Total Bilirubin: 1.1 mg/dL (ref 0.0–1.2)
Total Protein: 7.3 g/dL (ref 6.5–8.1)

## 2023-08-14 LAB — PROTIME-INR
INR: 1.5 — ABNORMAL HIGH (ref 0.8–1.2)
Prothrombin Time: 17.9 s — ABNORMAL HIGH (ref 11.4–15.2)

## 2023-08-14 LAB — LACTIC ACID, PLASMA
Lactic Acid, Venous: 1.4 mmol/L (ref 0.5–1.9)
Lactic Acid, Venous: 2 mmol/L (ref 0.5–1.9)

## 2023-08-14 LAB — BRAIN NATRIURETIC PEPTIDE: B Natriuretic Peptide: 1648.4 pg/mL — ABNORMAL HIGH (ref 0.0–100.0)

## 2023-08-14 LAB — MAGNESIUM: Magnesium: 1.3 mg/dL — ABNORMAL LOW (ref 1.7–2.4)

## 2023-08-14 LAB — BLOOD GAS, VENOUS

## 2023-08-14 LAB — TROPONIN I (HIGH SENSITIVITY)
Troponin I (High Sensitivity): 19 ng/L — ABNORMAL HIGH (ref <18)
Troponin I (High Sensitivity): 20 ng/L — ABNORMAL HIGH (ref <18)

## 2023-08-14 LAB — PROCALCITONIN: Procalcitonin: 4.3 ng/mL

## 2023-08-14 MED ORDER — ACETAMINOPHEN 650 MG RE SUPP: 650.0000 mg | Freq: Four times a day (QID) | RECTAL | Status: DC | PRN

## 2023-08-14 MED ORDER — VANCOMYCIN HCL IN DEXTROSE 1-5 GM/200ML-% IV SOLN: 1000.0000 mg | Freq: Once | INTRAVENOUS | Status: DC

## 2023-08-14 MED ORDER — LACTATED RINGERS IV SOLN
INTRAVENOUS | Status: DC
Start: 2023-08-14 — End: 2023-08-15

## 2023-08-14 MED ORDER — HALOPERIDOL LACTATE 5 MG/ML IJ SOLN
2.0000 mg | Freq: Once | INTRAMUSCULAR | Status: AC
  Administered 2023-08-14: 2 mg via INTRAMUSCULAR
  Filled 2023-08-14: qty 1

## 2023-08-14 MED ORDER — LATANOPROST 0.005 % OP SOLN
1.0000 [drp] | Freq: Every day | OPHTHALMIC | Status: DC
  Administered 2023-08-14: 1 [drp] via OPHTHALMIC
  Filled 2023-08-14: qty 2.5

## 2023-08-14 MED ORDER — SODIUM CHLORIDE 0.9 % IV SOLN
2.0000 g | Freq: Once | INTRAVENOUS | Status: AC
  Administered 2023-08-14: 2 g via INTRAVENOUS
  Filled 2023-08-14: qty 12.5

## 2023-08-14 MED ORDER — METOPROLOL SUCCINATE ER 50 MG PO TB24
50.0000 mg | ORAL_TABLET | Freq: Every day | ORAL | Status: DC
  Filled 2023-08-14: qty 1

## 2023-08-14 MED ORDER — VANCOMYCIN HCL 2000 MG/400ML IV SOLN
2000.0000 mg | Freq: Once | INTRAVENOUS | Status: AC
  Administered 2023-08-14: 2000 mg via INTRAVENOUS
  Filled 2023-08-14: qty 400

## 2023-08-14 MED ORDER — SODIUM CHLORIDE 0.9 % IV BOLUS
250.0000 mL | Freq: Once | INTRAVENOUS | Status: AC
  Administered 2023-08-14: 250 mL via INTRAVENOUS

## 2023-08-14 MED ORDER — MELATONIN 5 MG PO TABS
5.0000 mg | ORAL_TABLET | Freq: Every day | ORAL | Status: DC
  Filled 2023-08-14: qty 1

## 2023-08-14 MED ORDER — FUROSEMIDE 10 MG/ML IJ SOLN
20.0000 mg | Freq: Two times a day (BID) | INTRAMUSCULAR | Status: DC
  Administered 2023-08-15: 20 mg via INTRAVENOUS
  Filled 2023-08-14: qty 2

## 2023-08-14 MED ORDER — FUROSEMIDE 10 MG/ML IJ SOLN
40.0000 mg | Freq: Once | INTRAMUSCULAR | Status: AC
  Administered 2023-08-14: 40 mg via INTRAVENOUS
  Filled 2023-08-14 (×2): qty 4

## 2023-08-14 MED ORDER — SODIUM CHLORIDE 0.9 % IV SOLN
1.0000 g | Freq: Two times a day (BID) | INTRAVENOUS | Status: DC
  Administered 2023-08-14 – 2023-08-15 (×2): 1 g via INTRAVENOUS
  Filled 2023-08-14 (×2): qty 20

## 2023-08-14 MED ORDER — ACETAMINOPHEN 325 MG PO TABS
650.0000 mg | ORAL_TABLET | Freq: Four times a day (QID) | ORAL | Status: DC | PRN
Start: 2023-08-14 — End: 2023-08-15

## 2023-08-14 MED ORDER — FUROSEMIDE 10 MG/ML IJ SOLN: 20.0000 mg | Freq: Two times a day (BID) | INTRAMUSCULAR | Status: DC

## 2023-08-14 MED ORDER — ONDANSETRON HCL 4 MG/2ML IJ SOLN: 4.0000 mg | Freq: Four times a day (QID) | INTRAMUSCULAR | Status: DC | PRN

## 2023-08-14 MED ORDER — POTASSIUM CHLORIDE 10 MEQ/100ML IV SOLN
10.0000 meq | Freq: Once | INTRAVENOUS | Status: AC
  Administered 2023-08-14: 10 meq via INTRAVENOUS
  Filled 2023-08-14 (×2): qty 100

## 2023-08-14 MED ORDER — ONDANSETRON HCL 4 MG PO TABS
4.0000 mg | ORAL_TABLET | Freq: Four times a day (QID) | ORAL | Status: DC | PRN
Start: 2023-08-14 — End: 2023-08-15

## 2023-08-14 MED ORDER — ALBUTEROL SULFATE (2.5 MG/3ML) 0.083% IN NEBU
2.5000 mg | INHALATION_SOLUTION | RESPIRATORY_TRACT | Status: DC | PRN
Start: 2023-08-14 — End: 2023-08-15

## 2023-08-14 NOTE — Assessment & Plan Note (Signed)
 Thrombocytopenia Transfusion dependent Not a candidate for chemotherapy No acute issues this admission Palliative care consult in the a.m. per wife request

## 2023-08-14 NOTE — Assessment & Plan Note (Signed)
 Patient with multiple chronic medical conditions, multiple recent hospitalizations Palliative care consult

## 2023-08-14 NOTE — H&P (Signed)
 History and Physical    Patient: Robert [name] FMW:[id number] DOB: [birth year] DOA: 08/14/2023 DOS: the patient was seen and examined on 08/14/2023 PCP: Britta King, MD  Patient coming from: Home  Chief Complaint:  Chief Complaint  Patient presents with   Altered Mental Status    HPI: Robert [name] is a 78 y.o. 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  He presented by EMS from SNF after they were called out for altered mental status. In the ED his vitals were initially within normal limits however he desaturated to as low as 75% and is now on O2 at 4 L. VBG on 4 L showed pH of 7.41 and pCO2 49.  Troponin 19 and BNP 1648(up from 400 a year ago).  WBC normal at 4.0 and lactic acid 1.4 and hemoglobin at usual baseline of 7.6.  Platelets 41,000.  Creatinine at baseline at 1.89, potassium 3.2 and magnesium  1.3.  Respiratory viral panel negative.  Urinalysis pending. EKG showed possible A-fib at 860.2.  Chest x-ray shows worsening left perihilar and basilar airspace consolidation with pleural effusions and head CT nonacute. The ED provider spoke with wife who voiced interest in having a palliative care discussion in the a.m. but would like to continue acute treatment. Patient started on vancomycin  and cefepime  and given an LR bolus as well as potassium repletion.  Also treated with a dose of IV Lasix  40 for CHF. Admission requested     Past Medical History:  Diagnosis Date   Allergy    Cellulitis    Diabetes mellitus without complication (HCC)    Hyperlipidemia    Hypertension    Leukemia (HCC)    Peripheral vascular disease [facility])    Past Surgical  History:  Procedure Laterality Date   BIOPSY  08/21/2022   Procedure: BIOPSY;  Surgeon: Onita Elspeth Sharper, DO;  Location: [facility] ENDOSCOPY;  Service: Gastroenterology;;   COLONOSCOPY WITH PROPOFOL  N/A 10/02/2017   Procedure: COLONOSCOPY WITH PROPOFOL ;  Surgeon: Therisa Bi, MD;  Location: [facility] ENDOSCOPY;  Service: Gastroenterology;  Laterality: N/A;   COLONOSCOPY WITH PROPOFOL  N/A 08/21/2022   Procedure: COLONOSCOPY WITH PROPOFOL ;  Surgeon: Onita Elspeth Sharper, DO;  Location: [facility] ENDOSCOPY;  Service: Gastroenterology;  Laterality: N/A;   ESOPHAGOGASTRODUODENOSCOPY (EGD) WITH PROPOFOL  N/A 08/11/2022   Procedure: ESOPHAGOGASTRODUODENOSCOPY (EGD) WITH PROPOFOL ;  Surgeon: Jinny Carmine, MD;  Location: [facility] ENDOSCOPY;  Service: Endoscopy;  Laterality: N/A;   HERNIA REPAIR     IR BONE MARROW BIOPSY & ASPIRATION  04/10/2023   PERICARDIOCENTESIS N/A 08/29/2022   Procedure: PERICARDIOCENTESIS;  Surgeon: Mady Bruckner, MD;  Location: [facility] INVASIVE CV LAB;  Service: Cardiovascular;  Laterality: N/A;   POLYPECTOMY  08/21/2022   Procedure: POLYPECTOMY;  Surgeon: Onita Elspeth Sharper, DO;  Location: [facility] ENDOSCOPY;  Service: Gastroenterology;;   Social History:  reports that he has never smoked. He has never used smokeless tobacco. He reports that he does not currently use alcohol. He reports that he does not use drugs.  No Known Allergies  Family History  Problem Relation Age of Onset   Heart disease Mother    Stroke Mother    Heart disease Father    Heart attack Father     Prior  to Admission medications   Medication Sig Start Date End Date Taking? Authorizing Provider  acetaminophen  (TYLENOL ) 650 MG CR tablet Take 650 mg by mouth every 6 (six) hours as needed for pain (Headache).    [provider]  amoxicillin  (AMOXIL ) 500 MG capsule Take 1 capsule (500 mg total) by mouth every 8 (eight) hours for 6 days. 08/10/23 08/16/23  Josette Ade, MD  DULoxetine  (CYMBALTA ) 60 MG capsule Take 1  capsule (60 mg total) by mouth 2 (two) times daily. 04/12/23   Alexander, Natalie, DO  feeding supplement (ENSURE PLUS HIGH PROTEIN) LIQD Take 237 mLs by mouth 3 (three) times daily between meals. 08/10/23   Josette Ade, MD  hydroxypropyl methylcellulose / hypromellose (ISOPTO TEARS / GONIOVISC) 2.5 % ophthalmic solution Place 1 drop into both eyes 3 (three) times daily.    [provider]  latanoprost  (XALATAN ) 0.005 % ophthalmic solution Place 1 drop into both eyes at bedtime. 04/12/23   Alexander, Natalie, DO  melatonin 5 MG TABS Take 1 tablet (5 mg total) by mouth at bedtime. 04/12/23   Alexander, Natalie, DO  metoprolol  succinate (TOPROL -XL) 50 MG 24 hr tablet Take 1 tablet (50 mg total) by mouth daily. Take with or immediately following a meal. 04/12/23   Marsa Edelman, DO  Multiple Vitamin (MULTIVITAMIN WITH MINERALS) TABS tablet Take 1 tablet by mouth daily. 08/11/23   Josette Ade, MD  omeprazole  (PRILOSEC) 20 MG capsule Take 20 mg by mouth 2 (two) times daily.    [provider]  ondansetron  (ZOFRAN ) 8 MG tablet Take 1 tablet (8 mg total) by mouth every 8 (eight) hours as needed for nausea or vomiting. 05/01/23   Dasie Tinnie MATSU, NP  polyethylene glycol powder (GLYCOLAX /MIRALAX ) 17 GM/SCOOP powder Take 17 g by mouth daily as needed for mild constipation. 08/10/23   Josette Ade, MD  sodium phosphate (FLEET) ENEM Place 133 mLs (1 enema total) rectally daily as needed for severe constipation. 04/12/23   Marsa Edelman, DO    Physical Exam: Vitals:   08/14/23 1717 08/14/23 1730 08/14/23 1830 08/14/23 1942  BP:  127/79 123/72   Pulse:  84 85   Resp:  11 13   Temp:    98.7 F (37.1 C)  TempSrc:    Oral  SpO2:  95% 99%   Weight: 92.1 kg     Height: 6' 2 (1.88 m)      Physical Exam Vitals and nursing note reviewed.  Constitutional:      General: He is not in acute distress.    Comments: Carollynn elderly male, lying in fetal position in bed,  lethargic but restless.  HENT:     Head: Normocephalic and atraumatic.   Cardiovascular:     Rate and Rhythm: Normal rate and regular rhythm.     Heart sounds: Normal heart sounds.  Pulmonary:     Effort: Pulmonary effort is normal.     Breath sounds: Normal breath sounds.     Comments: Breath sounds diminished at bases Abdominal:     Palpations: Abdomen is soft.     Tenderness: There is no abdominal tenderness.   Neurological:     General: No focal deficit present.     Labs on Admission: I have personally reviewed following labs and imaging studies  CBC: Recent Labs  Lab 08/08/23 1116 08/09/23 0525 08/10/23 0745 08/13/23 1037 08/14/23 1925  WBC 4.0 3.8* 3.7* 3.1* 4.0  NEUTROABS  --   --   --  1.0* 0.7*  HGB 7.3* 7.6* 7.8* 8.5* 7.6*  HCT 23.6* 23.9* 25.1* 28.5* 25.5*  MCV 91.1 88.8 89.3 91.9 93.1  PLT 56* 54* 49* 47* 41*   Basic Metabolic Panel: Recent Labs  Lab 08/08/23 1116 08/09/23 0525 08/14/23 1925  NA 140 139 139  K 3.7 3.5 3.2*  CL 105 105 102  CO2 27 27 27   GLUCOSE 98 87 88  BUN 17 18 23   CREATININE 1.70* 1.63* 1.89*  CALCIUM 8.3* 8.3* 8.5*  MG  --   --  1.3*   GFR: Estimated Creatinine Clearance: 37.5 mL/min (A) (by C-G formula based on SCr of 1.89 mg/dL (H)). Liver Function Tests: Recent Labs  Lab 08/08/23 1116 08/14/23 1925  AST 14* 18  ALT 8 9  ALKPHOS 58 62  BILITOT 1.0 1.1  PROT 6.7 7.3  ALBUMIN  1.8* 1.9*   No results for input(s): LIPASE, AMYLASE in the last 168 hours. No results for input(s): AMMONIA in the last 168 hours. Coagulation Profile: Recent Labs  Lab 08/14/23 1925  INR 1.5*   Cardiac Enzymes: No results for input(s): CKTOTAL, CKMB, CKMBINDEX, TROPONINI in the last 168 hours. BNP (last 3 results) No results for input(s): PROBNP in the last 8760 hours. HbA1C: No results for input(s): HGBA1C in the last 72 hours. CBG: No results for input(s): GLUCAP in the last 168 hours. Lipid Profile: No  results for input(s): CHOL, HDL, LDLCALC, TRIG, CHOLHDL, LDLDIRECT in the last 72 hours. Thyroid  Function Tests: No results for input(s): TSH, T4TOTAL, FREET4, T3FREE, THYROIDAB in the last 72 hours. Anemia Panel: No results for input(s): VITAMINB12, FOLATE, FERRITIN, TIBC, IRON , RETICCTPCT in the last 72 hours. Urine analysis:    Component Value Date/Time   COLORURINE YELLOW (A) 08/08/2023 1500   APPEARANCEUR HAZY (A) 08/08/2023 1500   LABSPEC 1.013 08/08/2023 1500   PHURINE 5.0 08/08/2023 1500   GLUCOSEU NEGATIVE 08/08/2023 1500   HGBUR SMALL (A) 08/08/2023 1500   BILIRUBINUR NEGATIVE 08/08/2023 1500   KETONESUR NEGATIVE 08/08/2023 1500   PROTEINUR 30 (A) 08/08/2023 1500   NITRITE NEGATIVE 08/08/2023 1500   LEUKOCYTESUR SMALL (A) 08/08/2023 1500    Radiological Exams on Admission: CT Head Wo Contrast Result Date: 08/14/2023 CLINICAL DATA:  Altered mental status EXAM: CT HEAD WITHOUT CONTRAST TECHNIQUE: Contiguous axial images were obtained from the base of the skull through the vertex without intravenous contrast. RADIATION DOSE REDUCTION: This exam was performed according to the departmental dose-optimization program which includes automated exposure control, adjustment of the mA and/or kV according to patient size and/or use of iterative reconstruction technique. COMPARISON:  08/08/2023 FINDINGS: Brain: No acute intracranial abnormality. Specifically, no hemorrhage, hydrocephalus, mass lesion, acute infarction, or significant intracranial injury. There is atrophy and chronic small vessel disease changes. Vascular: No hyperdense vessel or unexpected calcification. Skull: No acute calvarial abnormality. Sinuses/Orbits: Mucosal thickening within the paranasal sinuses. No air-fluid levels. Complete opacification of the right mastoid air cells and partial opacification with fluid in the left mastoid air cells, unchanged. Other: None IMPRESSION: Atrophy, chronic  microvascular disease. No acute intracranial abnormality. Chronic sinusitis. Bilateral mastoid effusions, right greater than left, stable. Electronically Signed   By: Franky Crease M.D.   On: 08/14/2023 18:06   DG Chest Port 1 View Result Date: 08/14/2023 CLINICAL DATA:  Sepsis, altered mental status EXAM: PORTABLE CHEST - 1 VIEW COMPARISON:  08/08/2023 FINDINGS: Worsening left perihilar and basilar airspace consolidation. Mild right perihilar pulmonary vascular congestion. Pleural effusions left greater than right. Heart size difficult to assess due  to adjacent opacities. Aortic Atherosclerosis (ICD10-170.0). Vertebral endplate spurring at multiple levels in the lower thoracic spine. IMPRESSION: Worsening left perihilar and basilar airspace consolidation with pleural effusions. Electronically Signed   By: JONETTA Faes M.D.   On: 08/14/2023 17:46   Data Reviewed for HPI: Relevant notes from primary care and specialist visits, past discharge summaries as available in EHR, including Care Everywhere. Prior diagnostic testing as pertinent to current admission diagnoses Updated medications and problem lists for reconciliation ED course, including vitals, labs, imaging, treatment and response to treatment Triage notes, nursing and pharmacy notes and ED provider's notes Notable results as noted above in HPI      Assessment and Plan: * Acute respiratory failure with hypoxia (HCC) Patient hypoxic to 75% on room air with no prior O2 requirement Likely secondary to CHF exacerbation and suspect underlying infection based on CXR read Supplemental oxygen to keep sats over 92 and wean as tolerated Treat CHF and pneumonia-outlined on the respective problem Palliative care consult in the a.m.   Acute on chronic HFrEF (heart failure with reduced ejection fraction) (HCC) BNP 1648, with pleural effusions on chest x-ray Exacerbation probably triggered by fluid administration during recent admission from 6/18 to  6/20 for VRE UTI EF 45 to 50% 03/2023 Received IV Lasix  40 mg x 1 in the ED Will continue low-dose Lasix  Continue home metoprolol , once BP could tolerate Daily weights with intake and output monitoring  HCAP (healthcare-associated pneumonia) History of VRE infection(UTI) 08/08/2023 Will treat with meropenem  given recent VRE Aspiration precautions Will keep n.p.o. overnight    Acute metabolic encephalopathy Secondary to acute CHF and pneumonia Neurologic checks with fall and aspiration precautions Delirium precautions  AML (acute myeloblastic leukemia) (HCC) Thrombocytopenia Transfusion dependent Not a candidate for chemotherapy No acute issues this admission Palliative care consult in the a.m. per wife request  Type 2 diabetes mellitus with complication (HCC) Sliding scale insulin  coverage  Essential hypertension Continue home metoprolol   Stage 3a chronic kidney disease (HCC) At baseline  Frailty Patient with multiple chronic medical conditions, multiple recent hospitalizations Palliative care consult      DVT prophylaxis: SCD due to thrombocytopenia  Consults: palliative care  Advance Care Planning:   Code Status: Limited: Do not attempt resuscitation (DNR) -DNR-LIMITED -Do Not Intubate/DNI    Family Communication: none  Disposition Plan: Back to previous home environment  Severity of Illness: The appropriate patient status for this patient is OBSERVATION. Observation status is judged to be reasonable and necessary in order to provide the required intensity of service to ensure the patient's safety. The patient's presenting symptoms, physical exam findings, and initial radiographic and laboratory data in the context of their medical condition is felt to place them at decreased risk for further clinical deterioration. Furthermore, it is anticipated that the patient will be medically stable for discharge from the hospital within 2 midnights of admission.    Author: Delayne LULLA Solian, MD 08/14/2023 9:45 PM  For on call review [email or web address].

## 2023-08-14 NOTE — Progress Notes (Signed)
 Elink following code sepsis

## 2023-08-14 NOTE — Assessment & Plan Note (Signed)
 Patient hypoxic to 75% on room air with no prior O2 requirement Likely secondary to CHF exacerbation and suspect underlying infection based on CXR read Supplemental oxygen to keep sats over 92 and wean as tolerated Treat CHF and pneumonia-outlined on the respective problem Palliative care consult in the a.m.

## 2023-08-14 NOTE — Assessment & Plan Note (Signed)
 At baseline

## 2023-08-14 NOTE — Assessment & Plan Note (Signed)
 Sliding scale insulin coverage

## 2023-08-14 NOTE — Assessment & Plan Note (Signed)
 Continue home metoprolol.

## 2023-08-14 NOTE — Assessment & Plan Note (Signed)
 History of VRE infection(UTI) 08/08/2023 Will treat with meropenem  given recent VRE Aspiration precautions Will keep n.p.o. overnight

## 2023-08-14 NOTE — Progress Notes (Signed)
 CRITICAL VALUE STICKER  CRITICAL VALUE: Lactic Acid 2.0  RECEIVER (on-site recipient of call): Isaiah PEAK / Alex RN  DATE & TIME NOTIFIED: 08/14/23 2334  MESSENGER (representative from lab): Humphrey  MD NOTIFIED: HILARIO Solian, MD  TIME OF NOTIFICATION: 2335  RESPONSE:  MD aware, no new orders.

## 2023-08-14 NOTE — Hospital Course (Signed)
 SABRA

## 2023-08-14 NOTE — Progress Notes (Signed)
 Elink noted delay in protocol due to no IV access. Bedside nurse has requested IV team consult.

## 2023-08-14 NOTE — Assessment & Plan Note (Signed)
 Secondary to acute CHF and pneumonia Neurologic checks with fall and aspiration precautions Delirium precautions

## 2023-08-14 NOTE — ED Notes (Signed)
 Assisted RN repositioning pt in the bed.

## 2023-08-14 NOTE — ED Triage Notes (Signed)
 Pt here via [facility] with AMS.  Pt sent in by facility for altered mental status.  Hx of UTI.

## 2023-08-14 NOTE — Consult Note (Signed)
 CODE SEPSIS - PHARMACY COMMUNICATION  **Broad Spectrum Antibiotics should be administered within 1 hour of Sepsis diagnosis**  Time Code Sepsis Called/Page Received: 1744  Antibiotics Ordered: cefepime , vancomycin   Time of 1st antibiotic administration: 1850  Additional action taken by pharmacy: difficulty with placing IV x 2. IV team consulted and placed line, delaying abx administration.  If necessary, Name of Provider/Nurse Contacted: n/a    Zekiel Torian A Yoan Sallade ,PharmD Clinical Pharmacist  08/14/2023  7:33 PM

## 2023-08-14 NOTE — ED Provider Notes (Signed)
 [facility] Provider Note    Event Date/Time   First MD Initiated Contact with Patient 08/14/23 1721     (approximate)   History   Altered Mental Status   HPI  Robert [name] is a 78 y.o. male past medical history significant for AML, history of transfusions, hyperlipidemia, CKD, hypertension, diabetes, who presents to the emergency department with altered mental status.  Patient called out from nursing facility for altered mental status and confusion.  Found to have significant hypoxia with EMS arrived with an O2 saturation in the 70s.  Patient does not normally wear oxygen.  Patient had a recent hospitalization for urinary tract infection and at time of discharge was discharged on antibiotics.  Patient unable to give me any further history.  On chart review patient had a recent hospitalization and was just discharged on 08/10/2023.  At time of discharge patient was made DNR.  Patient was followed by oncology and did not recommend any further chemotherapy treatments.  Palliative care is following the patient and he is overall with a poor prognosis since he is transfusion dependent.  Has a history of vancomycin -resistant Enterococcus, patient was on Zosyn  and then was switched to amoxicillin  for 6 more days.  Discussed with his wife over the phone Diane.  States that he is not currently enrolled in hospice but she does want to have a hospice consultation.  States that she was trying to get it arranged over the next couple of days.  Does state that given that he is needing oxygen that he would want to be admitted to the hospital for oxygen and antibiotics.  She would like to talk to the hospice and palliative care team.  After further discussion patient was also made DO NOT INTUBATE in addition to DO NOT RESUSCITATE.     Physical Exam   Triage Vital Signs: ED Triage Vitals [08/14/23 1717]  Encounter Vitals Group     BP      Girls Systolic BP Percentile       Girls Diastolic BP Percentile      Boys Systolic BP Percentile      Boys Diastolic BP Percentile      Pulse      Resp      Temp      Temp src      SpO2      Weight 203 lb (92.1 kg)     Height 6' 2 (1.88 m)     Head Circumference      Peak Flow      Pain Score      Pain Loc      Pain Education      Exclude from Growth Chart     Most recent vital signs: Vitals:   08/14/23 1942 08/14/23 2232  BP:  (!) 150/90  Pulse:  82  Resp:  20  Temp: 98.7 F (37.1 C) 98 F (36.7 C)  SpO2:  100%    Physical Exam Constitutional:      Appearance: He is well-developed. He is ill-appearing.  HENT:     Head: Atraumatic.   Eyes:     Extraocular Movements: Extraocular movements intact.     Conjunctiva/sclera: Conjunctivae normal.     Pupils: Pupils are equal, round, and reactive to light.    Cardiovascular:     Rate and Rhythm: Regular rhythm.  Pulmonary:     Effort: Respiratory distress present.     Breath sounds: Wheezing present.  Comments: 75% on room air, placed on 4 L nasal cannula.  Rhonchi throughout all lung fields  Musculoskeletal:     Cervical back: Normal range of motion.     Right lower leg: No edema.     Left lower leg: No edema.   Skin:    General: Skin is warm.     Capillary Refill: Capillary refill takes less than 2 seconds.   Neurological:     Mental Status: He is alert. He is disoriented.     Comments: States that it is 70 for the year.  Uncertain of where he is or what his name is    IMPRESSION / MDM / ASSESSMENT AND PLAN / ED COURSE  I reviewed the triage vital signs and the nursing notes.  On arrival patient with significant hypoxia.  Differential diagnosis including pneumonia, CHF, ACS, anemia, viral illness including COVID/influenza  Blood cultures obtained and started on antibiotics to cover for hospital-acquired pneumonia given his recent hospitalization with IV antibiotics.  EKG  I, Clotilda Punter, the attending physician, personally  viewed and interpreted this ECG. EKG with reading atrial flutter however unclear if he does have flutter waves.  I do see P waves that are present and has a significantly prolonged PR interval.  Narrow complex QRS.  QTc 352.  Nonspecific ST changes.  No tachycardic or bradycardic dysrhythmias while on cardiac telemetry.  RADIOLOGY I independently reviewed imaging, my interpretation of imaging: Chest x-ray with findings concerning for pneumonia left perihilar and pleural effusion  CT scan of the head without signs of intracranial hemorrhage  LABS (all labs ordered are listed, but only abnormal results are displayed) Labs interpreted as -    Labs Reviewed  COMPREHENSIVE METABOLIC PANEL WITH GFR - Abnormal; Notable for the following components:      Result Value   Potassium 3.2 (*)    Creatinine, Ser 1.89 (*)    Calcium 8.5 (*)    Albumin  1.9 (*)    GFR, Estimated 36 (*)    All other components within normal limits  CBC WITH DIFFERENTIAL/PLATELET - Abnormal; Notable for the following components:   RBC 2.74 (*)    Hemoglobin 7.6 (*)    HCT 25.5 (*)    MCHC 29.8 (*)    RDW 17.6 (*)    Platelets 41 (*)    nRBC 1.3 (*)    Neutro Abs 0.7 (*)    Abs Immature Granulocytes 0.10 (*)    All other components within normal limits  PROTIME-INR - Abnormal; Notable for the following components:   Prothrombin Time 17.9 (*)    INR 1.5 (*)    All other components within normal limits  BRAIN NATRIURETIC PEPTIDE - Abnormal; Notable for the following components:   B Natriuretic Peptide 1,648.4 (*)    All other components within normal limits  BLOOD GAS, VENOUS - Abnormal; Notable for the following components:   pO2, Ven <31 (*)    Bicarbonate 31.1 (*)    Acid-Base Excess 5.3 (*)    All other components within normal limits  MAGNESIUM  - Abnormal; Notable for the following components:   Magnesium  1.3 (*)    All other components within normal limits  TROPONIN I (HIGH SENSITIVITY) - Abnormal;  Notable for the following components:   Troponin I (High Sensitivity) 19 (*)    All other components within normal limits  RESP PANEL BY RT-PCR (RSV, FLU A&B, COVID)  RVPGX2  CULTURE, BLOOD (ROUTINE X 2)  CULTURE, BLOOD (ROUTINE X  2)  MRSA NEXT GEN BY PCR, NASAL  LACTIC ACID, PLASMA  PROCALCITONIN  LACTIC ACID, PLASMA  URINALYSIS, W/ REFLEX TO CULTURE (INFECTION SUSPECTED)  PATHOLOGIST SMEAR REVIEW  BASIC METABOLIC PANEL WITH GFR  CBC  TROPONIN I (HIGH SENSITIVITY)     MDM  COVID influenza testing are negative.  Attempted to wean off oxygen however patient continued to drop significantly to the 70s.  Pulling off his oxygen multiple times and desatting into the 80s.  Given IV Haldol for agitation.  Placed on nonrebreather since he was taking the oxygen out of his nose.  Started on broad-spectrum antibiotics for concern for pneumonia.  BNP significantly elevated at 1600, concern for heart failure exacerbation as part of the etiology of his hypoxia.  Given IV Lasix .  Anemia but hemoglobin is stable at 7.6.  Chronically low platelets of 41 potassium low at 3.2, replaced IV.  Normal lactic acid at 1.4 VBG without significant hypercarbia  Long discussion with the patient's wife who would want him admitted for oxygen and IV antibiotics.  States that she was concerned of him going back on hospice measures to [facility] since they do not check on him frequently.  Would want a palliative care/hospice consultation and conversation in the morning.  Patient admitted to the hospitalist for acute hypoxia with acute on chronic heart failure exacerbation and pneumonia     PROCEDURES:  Critical Care performed: yes  .Critical Care  Performed by: Suzanne Kirsch, MD Authorized by: Suzanne Kirsch, MD   Critical care provider statement:    Critical care time (minutes):  30   Critical care time was exclusive of:  Separately billable procedures and treating other patients   Critical care was  necessary to treat or prevent imminent or life-threatening deterioration of the following conditions:  Cardiac failure   Critical care was time spent personally by me on the following activities:  Development of treatment plan with patient or surrogate, discussions with consultants, evaluation of patient's response to treatment, examination of patient, ordering and review of laboratory studies, ordering and review of radiographic studies, ordering and performing treatments and interventions, pulse oximetry, re-evaluation of patient's condition and review of old charts   Care discussed with: admitting provider     Patient's presentation is most consistent with acute presentation with potential threat to life or bodily function.   MEDICATIONS ORDERED IN ED: Medications  lactated ringers  infusion ( Intravenous New Bag/Given 08/14/23 1850)  potassium chloride  10 mEq in 100 mL IVPB (10 mEq Intravenous New Bag/Given 08/14/23 2303)  metoprolol  succinate (TOPROL -XL) 24 hr tablet 50 mg (has no administration in time range)  melatonin tablet 5 mg (5 mg Oral Not Given 08/14/23 2303)  latanoprost  (XALATAN ) 0.005 % ophthalmic solution 1 drop (1 drop Both Eyes Given 08/14/23 2301)  acetaminophen  (TYLENOL ) tablet 650 mg (has no administration in time range)    Or  acetaminophen  (TYLENOL ) suppository 650 mg (has no administration in time range)  ondansetron  (ZOFRAN ) tablet 4 mg (has no administration in time range)    Or  ondansetron  (ZOFRAN ) injection 4 mg (has no administration in time range)  albuterol  (PROVENTIL ) (2.5 MG/3ML) 0.083% nebulizer solution 2.5 mg (has no administration in time range)  furosemide  (LASIX ) injection 20 mg (has no administration in time range)  meropenem  (MERREM ) 1 g in sodium chloride  0.9 % 100 mL IVPB (has no administration in time range)  ceFEPIme  (MAXIPIME ) 2 g in sodium chloride  0.9 % 100 mL IVPB (0 g Intravenous Stopped 08/14/23 1915)  vancomycin  (VANCOREADY) IVPB 2000 mg/400  mL (0 mg Intravenous Stopped 08/14/23 2246)  sodium chloride  0.9 % bolus 250 mL (250 mLs Intravenous New Bag/Given 08/14/23 2126)  furosemide  (LASIX ) injection 40 mg (40 mg Intravenous Given 08/14/23 2300)  haloperidol lactate (HALDOL) injection 2 mg (2 mg Intramuscular Given 08/14/23 2125)    FINAL CLINICAL IMPRESSION(S) / ED DIAGNOSES   Final diagnoses:  Delirium  Hypoxia  Congestive heart failure, unspecified HF chronicity, unspecified heart failure type (HCC)  Pneumonia of left lower lobe due to infectious organism     Rx / DC Orders   ED Discharge Orders     None        Note:  This document was prepared using Dragon voice recognition software and may include unintentional dictation errors.   Suzanne Kirsch, MD 08/14/23 2325

## 2023-08-14 NOTE — Sepsis Progress Note (Signed)
 Confirmed with Bedside RN that antibiotics were given after blood culture collection.

## 2023-08-14 NOTE — Assessment & Plan Note (Signed)
 BNP 1648, with pleural effusions on chest x-ray Exacerbation probably triggered by fluid administration during recent admission from 6/18 to 6/20 for VRE UTI EF 45 to 50% 03/2023 Received IV Lasix  40 mg x 1 in the ED Will continue low-dose Lasix  Continue home metoprolol , once BP could tolerate Daily weights with intake and output monitoring

## 2023-08-14 NOTE — ED Notes (Signed)
 2 attempts at IV access were unsuccessful.  Order for IV team placed and provider notified of delay.

## 2023-08-15 DIAGNOSIS — Z515 Encounter for palliative care: Secondary | ICD-10-CM | POA: Diagnosis not present

## 2023-08-15 DIAGNOSIS — J9601 Acute respiratory failure with hypoxia: Secondary | ICD-10-CM | POA: Diagnosis not present

## 2023-08-15 LAB — GLUCOSE, CAPILLARY
Glucose-Capillary: 69 mg/dL — ABNORMAL LOW (ref 70–99)
Glucose-Capillary: 135 mg/dL — ABNORMAL HIGH (ref 70–99)
Glucose-Capillary: 99 mg/dL (ref 70–99)

## 2023-08-15 LAB — URINALYSIS, W/ REFLEX TO CULTURE (INFECTION SUSPECTED)
Bilirubin Urine: NEGATIVE
Glucose, UA: NEGATIVE mg/dL
Hgb urine dipstick: NEGATIVE
Ketones, ur: NEGATIVE mg/dL
Leukocytes,Ua: NEGATIVE
Nitrite: NEGATIVE
Protein, ur: 30 mg/dL — AB
Specific Gravity, Urine: 1.013 (ref 1.005–1.030)
pH: 5 (ref 5.0–8.0)

## 2023-08-15 LAB — CBC
HCT: 20.8 % — ABNORMAL LOW (ref 39.0–52.0)
Hemoglobin: 6.3 g/dL — ABNORMAL LOW (ref 13.0–17.0)
MCH: 28 pg (ref 26.0–34.0)
MCHC: 30.3 g/dL (ref 30.0–36.0)
MCV: 92.4 fL (ref 80.0–100.0)
Platelets: 35 10*3/uL — ABNORMAL LOW (ref 150–400)
RBC: 2.25 MIL/uL — ABNORMAL LOW (ref 4.22–5.81)
RDW: 17.4 % — ABNORMAL HIGH (ref 11.5–15.5)
WBC: 4.2 10*3/uL (ref 4.0–10.5)
nRBC: 0.7 % — ABNORMAL HIGH (ref 0.0–0.2)

## 2023-08-15 LAB — LACTIC ACID, PLASMA: Lactic Acid, Venous: 1.1 mmol/L (ref 0.5–1.9)

## 2023-08-15 LAB — BASIC METABOLIC PANEL WITH GFR
Anion gap: 10 (ref 5–15)
BUN: 26 mg/dL — ABNORMAL HIGH (ref 8–23)
CO2: 26 mmol/L (ref 22–32)
Calcium: 8 mg/dL — ABNORMAL LOW (ref 8.9–10.3)
Chloride: 107 mmol/L (ref 98–111)
Creatinine, Ser: 2 mg/dL — ABNORMAL HIGH (ref 0.61–1.24)
GFR, Estimated: 34 mL/min — ABNORMAL LOW (ref >60)
Glucose, Bld: 76 mg/dL (ref 70–99)
Potassium: 3.5 mmol/L (ref 3.5–5.1)
Sodium: 143 mmol/L (ref 135–145)

## 2023-08-15 LAB — MRSA NEXT GEN BY PCR, NASAL: MRSA by PCR Next Gen: NOT DETECTED

## 2023-08-15 LAB — PATHOLOGIST SMEAR REVIEW

## 2023-08-15 LAB — PREPARE RBC (CROSSMATCH)

## 2023-08-15 MED ORDER — MORPHINE SULFATE (PF) 2 MG/ML IV SOLN
2.0000 mg | INTRAVENOUS | Status: DC | PRN
  Administered 2023-08-15: 2 mg via INTRAVENOUS
  Filled 2023-08-15: qty 1

## 2023-08-15 MED ORDER — DEXTROSE IN LACTATED RINGERS 5 % IV SOLN: INTRAVENOUS | Status: DC

## 2023-08-15 MED ORDER — SODIUM CHLORIDE 0.9% IV SOLUTION: Freq: Once | INTRAVENOUS | Status: AC

## 2023-08-15 MED ORDER — DEXTROSE 50 % IV SOLN
1.0000 | Freq: Once | INTRAVENOUS | Status: AC
  Administered 2023-08-15: 50 mL via INTRAVENOUS
  Filled 2023-08-15: qty 50

## 2023-08-15 MED ORDER — HALOPERIDOL LACTATE 5 MG/ML IJ SOLN
1.0000 mg | INTRAMUSCULAR | Status: DC | PRN
  Administered 2023-08-15: 1 mg via INTRAVENOUS
  Filled 2023-08-15: qty 1

## 2023-08-15 MED ORDER — ENSURE PLUS HIGH PROTEIN PO LIQD: 237.0000 mL | Freq: Two times a day (BID) | ORAL | Status: DC

## 2023-08-15 MED ORDER — PIPERACILLIN-TAZOBACTAM 3.375 G IVPB: 3.3750 g | Freq: Three times a day (TID) | INTRAVENOUS | Status: DC

## 2023-08-15 NOTE — Discharge Summary (Signed)
 Physician Discharge Summary  Robert [name] FMW:[id number] DOB: [birth year] DOA: 08/14/2023  PCP: [place]a King, MD  Admit date: 08/14/2023 Discharge date: 08/15/2023  Admitted From: SNF Disposition:  [facility]  Recommendations for Outpatient Follow-up:  Per hospice providers   Home Health:NA  Equipment/Devices:None   Discharge Condition:Hospice  CODE STATUS:DNR Diet recommendation: Comfort feeds  Brief/Interim Summary:  78 y.o. 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  He presented by EMS from SNF after they were called out for altered mental status. In the ED his vitals were initially within normal limits however he desaturated to as low as 75% and is now on O2 at 4 L.  After discussion with TOC it was discovered that patient was supposed to admit to [facility] on 6/24.  Unclear why the patient wound up in the emergency room instead.  After confirmation the patient was accepted to [facility] he will be discharged.  DNR in chart will accompany patient.    Discharge Diagnoses:  Principal Problem:   Acute respiratory failure with hypoxia (HCC) Active Problems:   Acute on chronic HFrEF (heart failure with reduced ejection fraction) (HCC)   HCAP (healthcare-associated pneumonia)   History of infection with vancomycin  resistant Enterococcus (VRE)   Acute metabolic encephalopathy   AML (acute myeloblastic leukemia) (HCC)   Thrombocytopenia (HCC)   Type 2 diabetes mellitus with complication (HCC)   Essential hypertension   Stage 3a chronic kidney disease (HCC)   Frailty  Patient will be discharged to [facility] hospice  services.  Discharge Instructions  Discharge Instructions     Diet - low sodium heart healthy   Complete by: As directed    Increase activity slowly   Complete by: As directed    No wound care   Complete by: As directed       Allergies as of 08/15/2023   No Known Allergies      Medication List     STOP taking these medications    amoxicillin  500 MG capsule Commonly known as: AMOXIL    DULoxetine  60 MG capsule Commonly known as: Cymbalta    multivitamin with minerals Tabs tablet   omeprazole  20 MG capsule Commonly known as: PRILOSEC       TAKE these medications    acetaminophen  650 MG CR tablet Commonly known as: TYLENOL  Take 650 mg by mouth every 6 (six) hours as needed for pain (Headache).   FT Enema Saline 7-19 GM/118ML Enem Place 133 mLs (1 enema total) rectally daily as needed for severe constipation.   hydroxyprophyl methylcellulose / hypromellose 0.5 % opthalmic solution Commonly known as: ISOPTO TEARS Place 1 drop into both eyes 3 (three) times daily.   latanoprost  0.005 % ophthalmic solution Commonly known as: XALATAN  Place 1 drop into both eyes at bedtime.   melatonin 5 MG Tabs Take 1 tablet (5 mg total) by mouth at bedtime.   metoprolol  succinate 50 MG 24 hr tablet Commonly known as: TOPROL -XL Take 1 tablet (50 mg total) by mouth daily. Take with or immediately following a meal.   ondansetron  8 MG tablet Commonly known as: Zofran  Take 1 tablet (8 mg total) by mouth every 8 (eight) hours as needed for nausea or vomiting.  polyethylene glycol powder 17 GM/SCOOP powder Commonly known as: GLYCOLAX /MIRALAX  Take 17 g by mouth daily as needed for mild constipation.        No Known Allergies  Consultations: Palliative care   Procedures/Studies: CT Head Wo Contrast Result Date: 08/14/2023 CLINICAL DATA:  Altered mental status EXAM: CT HEAD WITHOUT CONTRAST TECHNIQUE: Contiguous axial images were obtained from the base of the skull  through the vertex without intravenous contrast. RADIATION DOSE REDUCTION: This exam was performed according to the departmental dose-optimization program which includes automated exposure control, adjustment of the mA and/or kV according to patient size and/or use of iterative reconstruction technique. COMPARISON:  08/08/2023 FINDINGS: Brain: No acute intracranial abnormality. Specifically, no hemorrhage, hydrocephalus, mass lesion, acute infarction, or significant intracranial injury. There is atrophy and chronic small vessel disease changes. Vascular: No hyperdense vessel or unexpected calcification. Skull: No acute calvarial abnormality. Sinuses/Orbits: Mucosal thickening within the paranasal sinuses. No air-fluid levels. Complete opacification of the right mastoid air cells and partial opacification with fluid in the left mastoid air cells, unchanged. Other: None IMPRESSION: Atrophy, chronic microvascular disease. No acute intracranial abnormality. Chronic sinusitis. Bilateral mastoid effusions, right greater than left, stable. Electronically Signed   By: Franky Crease M.D.   On: 08/14/2023 18:06   DG Chest Port 1 View Result Date: 08/14/2023 CLINICAL DATA:  Sepsis, altered mental status EXAM: PORTABLE CHEST - 1 VIEW COMPARISON:  08/08/2023 FINDINGS: Worsening left perihilar and basilar airspace consolidation. Mild right perihilar pulmonary vascular congestion. Pleural effusions left greater than right. Heart size difficult to assess due to adjacent opacities. Aortic Atherosclerosis (ICD10-170.0). Vertebral endplate spurring at multiple levels in the lower thoracic spine. IMPRESSION: Worsening left perihilar and basilar airspace consolidation with pleural effusions. Electronically Signed   By: JONETTA Faes M.D.   On: 08/14/2023 17:46   CT Head Wo Contrast Result Date: 08/08/2023 CLINICAL DATA:  Mental status change, unknown cause; Sublingual/submandibular abscess left facial swelling EXAM: CT HEAD WITHOUT  CONTRAST CT MAXILLOFACIAL WITHOUT CONTRAST TECHNIQUE: Multidetector CT imaging of the head and maxillofacial structures were performed using the standard protocol without intravenous contrast. Multiplanar CT image reconstructions of the maxillofacial structures were also generated. RADIATION DOSE REDUCTION: This exam was performed according to the departmental dose-optimization program which includes automated exposure control, adjustment of the mA and/or kV according to patient size and/or use of iterative reconstruction technique. COMPARISON:  CT head 04/03/2023 FINDINGS: CT HEAD FINDINGS Brain: Patchy and confluent areas of decreased attenuation are noted throughout the deep and periventricular white matter of the cerebral hemispheres bilaterally, compatible with chronic microvascular ischemic disease. No evidence of large-territorial acute infarction. No parenchymal hemorrhage. No mass lesion. No extra-axial collection. No mass effect or midline shift. No hydrocephalus. Basilar cisterns are patent. Vascular: No hyperdense vessel. Atherosclerotic calcifications are present within the cavernous internal carotid and vertebral arteries. Skull: No acute fracture or focal lesion. Other: None. CT MAXILLOFACIAL FINDINGS Osseous: No fracture or mandibular dislocation. No destructive process. Sinuses/Orbits: Complete right mastoid air cell effusion with fluid within the right middle ear. Partial left mastoid air cell effusion with fluid within the left middle ear. Paranasal sinuses are clear. Bilateral lens replacement. Otherwise the orbits are unremarkable. Soft tissues: Negative. IMPRESSION: 1. No acute intracranial abnormality. 2.  No acute displaced facial fracture. 3. Complete right mastoid air cell effusion with fluid within the right middle ear. Partial left mastoid air cell effusion with fluid within the left middle ear. Recommend correlation with physical exam for infection. In a Nurse, mental health  By:  Morgane  Naveau M.D.   On: 08/08/2023 16:22   CT Maxillofacial W Contrast Result Date: 08/08/2023 CLINICAL DATA:  Mental status change, unknown cause; Sublingual/submandibular abscess left facial swelling EXAM: CT HEAD WITHOUT CONTRAST CT MAXILLOFACIAL WITHOUT CONTRAST TECHNIQUE: Multidetector CT imaging of the head and maxillofacial structures were performed using the standard protocol without intravenous contrast. Multiplanar CT image reconstructions of the maxillofacial structures were also generated. RADIATION DOSE REDUCTION: This exam was performed according to the departmental dose-optimization program which includes automated exposure control, adjustment of the mA and/or kV according to patient size and/or use of iterative reconstruction technique. COMPARISON:  CT head 04/03/2023 FINDINGS: CT HEAD FINDINGS Brain: Patchy and confluent areas of decreased attenuation are noted throughout the deep and periventricular white matter of the cerebral hemispheres bilaterally, compatible with chronic microvascular ischemic disease. No evidence of large-territorial acute infarction. No parenchymal hemorrhage. No mass lesion. No extra-axial collection. No mass effect or midline shift. No hydrocephalus. Basilar cisterns are patent. Vascular: No hyperdense vessel. Atherosclerotic calcifications are present within the cavernous internal carotid and vertebral arteries. Skull: No acute fracture or focal lesion. Other: None. CT MAXILLOFACIAL FINDINGS Osseous: No fracture or mandibular dislocation. No destructive process. Sinuses/Orbits: Complete right mastoid air cell effusion with fluid within the right middle ear. Partial left mastoid air cell effusion with fluid within the left middle ear. Paranasal sinuses are clear. Bilateral lens replacement. Otherwise the orbits are unremarkable. Soft tissues: Negative. IMPRESSION: 1. No acute intracranial abnormality. 2.  No acute displaced facial fracture. 3. Complete right mastoid  air cell effusion with fluid within the right middle ear. Partial left mastoid air cell effusion with fluid within the left middle ear. Recommend correlation with physical exam for infection. In a Electronically Signed   By: Morgane  Naveau M.D.   On: 08/08/2023 16:22   DG Chest Port 1 View Result Date: 08/08/2023 CLINICAL DATA:  shortness of breath EXAM: PORTABLE CHEST 1 VIEW COMPARISON:  Chest x-ray 06/07/2023, chest x-ray 03/17/2023, chest x-ray 04/03/2023 FINDINGS: Patient is rotated. Prominent cardiomediastinal silhouette likely due to AP portable technique. The heart and mediastinal contours are grossly unchanged given patient rotation. No focal consolidation. Chronic coarsened interstitial markings with question pulmonary edema. Bilateral at least trace pleural effusions. No pneumothorax. No acute osseous abnormality. IMPRESSION: 1. Bilateral at least trace pleural effusions. 2. Chronic coarsened interstitial markings with question pulmonary edema. 3. Limited evaluation due to patient rotation. Consider chest x-ray PA and lateral repeat. Electronically Signed   By: Morgane  Naveau M.D.   On: 08/08/2023 15:59      Subjective: Seen and examined on the day of discharge.  Patient obtunded/encephalopathic.  Agitated.  Poor participation in interview.  Discharge Exam: Vitals:   08/15/23 1128 08/15/23 1143  BP: 123/61 (!) 127/92  Pulse: 72 72  Resp: 19 19  Temp: 98.2 F (36.8 C) 97.7 F (36.5 C)  SpO2: 96% 100%   Vitals:   08/15/23 0500 08/15/23 0700 08/15/23 1128 08/15/23 1143  BP: 130/72 133/64 123/61 (!) 127/92  Pulse: 82 83 72 72  Resp: 18 18 19 19   Temp: 98.8 F (37.1 C) 97.8 F (36.6 C) 98.2 F (36.8 C) 97.7 F (36.5 C)  TempSrc: Axillary Axillary Oral Oral  SpO2: 100% 99% 96% 100%  Weight: 86.3 kg     Height:         The results of significant diagnostics from this hospitalization (including imaging, microbiology, ancillary and laboratory) are listed below for reference.  Microbiology: Recent Results (from the past 240 hours)  Urine Culture     Status: Abnormal   Collection Time: 08/08/23  3:00 PM   Specimen: Urine, Clean Catch  Result Value Ref Range Status   Specimen Description   Final    URINE, CLEAN CATCH Performed at [facility], [address] [place], [place] [address]    Special Requests   Final    NONE Performed at [facility], [address] [place], [place] [address]    Culture (A)  Final    >=100,000 COLONIES/mL ENTEROCOCCUS FAECALIS VANCOMYCIN  RESISTANT ENTEROCOCCUS ISOLATED    Report Status 08/10/2023 FINAL  Final   Organism ID, Bacteria ENTEROCOCCUS FAECALIS (A)  Final      Susceptibility   Enterococcus faecalis - MIC*    AMPICILLIN <=2 SENSITIVE Sensitive     NITROFURANTOIN <=16 SENSITIVE Sensitive     VANCOMYCIN  >=32 RESISTANT Resistant     * >=100,000 COLONIES/mL ENTEROCOCCUS FAECALIS  Blood culture (routine x 2)     Status: None   Collection Time: 08/08/23  5:39 PM   Specimen: BLOOD  Result Value Ref Range Status   Specimen Description BLOOD RIGHT ANTECUBITAL  Final   Special Requests   Final    BOTTLES DRAWN AEROBIC AND ANAEROBIC Blood Culture results may not be optimal due to an inadequate volume of blood received in culture bottles   Culture   Final    NO GROWTH 5 DAYS Performed at [facility], [address], [place], [place] [address]    Report Status 08/13/2023 FINAL  Final  Blood culture (routine x 2)     Status: None   Collection Time: 08/08/23  6:02 PM   Specimen: BLOOD  Result Value Ref Range Status   Specimen Description BLOOD BLOOD LEFT WRIST  Final   Special Requests   Final    BOTTLES DRAWN AEROBIC AND ANAEROBIC Blood Culture adequate volume   Culture   Final    NO GROWTH 5 DAYS Performed at [facility], [address] [place], [place] [address]    Report Status 08/13/2023 FINAL  Final  Resp panel by RT-PCR (RSV, Flu A&B, Covid) Anterior Nasal Swab      Status: None   Collection Time: 08/14/23  5:23 PM   Specimen: Anterior Nasal Swab  Result Value Ref Range Status   SARS Coronavirus 2 by RT PCR NEGATIVE NEGATIVE Final    Comment: (NOTE) SARS-CoV-2 target nucleic acids are NOT DETECTED.  The SARS-CoV-2 RNA is generally detectable in upper respiratory specimens during the acute phase of infection. The lowest concentration of SARS-CoV-2 viral copies this assay can detect is 138 copies/mL. A negative result does not preclude SARS-Cov-2 infection and should not be used as the sole basis for treatment or other patient management decisions. A negative result may occur with  improper specimen collection/handling, submission of specimen other than nasopharyngeal swab, presence of viral mutation(s) within the areas targeted by this assay, and inadequate number of viral copies(<138 copies/mL). A negative result must be combined with clinical observations, patient history, and epidemiological information. The expected result is Negative.  Fact Sheet for Patients:  BloggerCourse.com  Fact Sheet for Healthcare Providers:  SeriousBroker.it  This test is no t yet approved or cleared by the [place]  FDA and  has been authorized for detection and/or diagnosis of SARS-CoV-2 by FDA under an Emergency Use Authorization (EUA). This EUA will remain  in effect (meaning this test can be used) for  the duration of the COVID-19 declaration under Section 564(b)(1) of the Act, 21 U.S.C.section 360bbb-3(b)(1), unless the authorization is terminated  or revoked sooner.       Influenza A by PCR NEGATIVE NEGATIVE Final   Influenza B by PCR NEGATIVE NEGATIVE Final    Comment: (NOTE) The Xpert Xpress SARS-CoV-2/FLU/RSV plus assay is intended as an aid in the diagnosis of influenza from Nasopharyngeal swab specimens and should not be used as a sole basis for treatment. Nasal washings and aspirates are  unacceptable for Xpert Xpress SARS-CoV-2/FLU/RSV testing.  Fact Sheet for Patients: BloggerCourse.com  Fact Sheet for Healthcare Providers: SeriousBroker.it  This test is not yet approved or cleared by the [place]  FDA and has been authorized for detection and/or diagnosis of SARS-CoV-2 by FDA under an Emergency Use Authorization (EUA). This EUA will remain in effect (meaning this test can be used) for the duration of the COVID-19 declaration under Section 564(b)(1) of the Act, 21 U.S.C. section 360bbb-3(b)(1), unless the authorization is terminated or revoked.     Resp Syncytial Virus by PCR NEGATIVE NEGATIVE Final    Comment: (NOTE) Fact Sheet for Patients: BloggerCourse.com  Fact Sheet for Healthcare Providers: SeriousBroker.it  This test is not yet approved or cleared by the [place]  FDA and has been authorized for detection and/or diagnosis of SARS-CoV-2 by FDA under an Emergency Use Authorization (EUA). This EUA will remain in effect (meaning this test can be used) for the duration of the COVID-19 declaration under Section 564(b)(1) of the Act, 21 U.S.C. section 360bbb-3(b)(1), unless the authorization is terminated or revoked.  Performed at [facility], [address] [place], [place] [address]   Blood Culture (routine x 2)     Status: None (Preliminary result)   Collection Time: 08/14/23  7:25 PM   Specimen: BLOOD LEFT ARM  Result Value Ref Range Status   Specimen Description   Final    BLOOD LEFT ARM Performed at [facility], [address], [place], [place] [address]    Special Requests   Final    BOTTLES DRAWN AEROBIC AND ANAEROBIC Blood Culture adequate volume Performed at [facility], [address] [place], [place] [address]    Culture   Final    NO GROWTH < 24 HOURS Performed at [facility], [address] [place], [place] [address]    Report Status PENDING  Incomplete  Blood Culture (routine x 2)     Status: None (Preliminary result)   Collection Time: 08/14/23  7:45 PM   Specimen: BLOOD  Result Value Ref Range Status   Specimen Description   Final    BLOOD BLOOD LEFT HAND Performed at [facility], [address] [place], [place] [address]    Special Requests   Final    BOTTLES DRAWN AEROBIC AND ANAEROBIC Blood Culture results may not be optimal due to an inadequate volume of blood received in culture bottles Performed at [facility], [address], [place], [place] [address]    Culture   Final    NO GROWTH < 24 HOURS Performed at [facility], [address] [place], [place] [address]    Report Status PENDING  Incomplete  MRSA Next Gen by PCR, Nasal     Status: None   Collection Time: 08/14/23 11:15 PM   Specimen: Nasal Mucosa; Nasal Swab  Result Value Ref Range Status   MRSA by PCR Next Gen NOT DETECTED NOT DETECTED Final  Comment: (NOTE) The GeneXpert MRSA Assay (FDA approved for NASAL specimens only), is one component of a comprehensive MRSA colonization surveillance program. It is not intended to diagnose MRSA infection nor to guide or monitor treatment for MRSA infections. Test performance is not FDA approved in patients less than 56 years old. Performed at [facility], [address] [place], [place] [address]      Labs: BNP (last 3 results) Recent Labs    08/22/22 1149 08/14/23 1925  BNP 445.4* 1,648.4*   Basic Metabolic Panel: Recent Labs  Lab 08/09/23 0525 08/14/23 1925 08/15/23 0412  NA 139 139 143  K 3.5 3.2* 3.5  CL 105 102 107  CO2 27 27 26   GLUCOSE 87 88 76  BUN 18 23 26*  CREATININE 1.63* 1.89* 2.00*  CALCIUM 8.3* 8.5* 8.0*  MG  --  1.3*  --    Liver Function Tests: Recent Labs  Lab 08/14/23 1925  AST 18  ALT 9  ALKPHOS 62  BILITOT 1.1  PROT 7.3  ALBUMIN  1.9*   No results for  input(s): LIPASE, AMYLASE in the last 168 hours. No results for input(s): AMMONIA in the last 168 hours. CBC: Recent Labs  Lab 08/09/23 0525 08/10/23 0745 08/13/23 1037 08/14/23 1925 08/15/23 0412  WBC 3.8* 3.7* 3.1* 4.0 4.2  NEUTROABS  --   --  1.0* 0.7*  --   HGB 7.6* 7.8* 8.5* 7.6* 6.3*  HCT 23.9* 25.1* 28.5* 25.5* 20.8*  MCV 88.8 89.3 91.9 93.1 92.4  PLT 54* 49* 47* 41* 35*   Cardiac Enzymes: No results for input(s): CKTOTAL, CKMB, CKMBINDEX, TROPONINI in the last 168 hours. BNP: Invalid input(s): POCBNP CBG: Recent Labs  Lab 08/15/23 0804 08/15/23 0851 08/15/23 1217  GLUCAP 69* 135* 99   D-Dimer No results for input(s): DDIMER in the last 72 hours. Hgb A1c No results for input(s): HGBA1C in the last 72 hours. Lipid Profile No results for input(s): CHOL, HDL, LDLCALC, TRIG, CHOLHDL, LDLDIRECT in the last 72 hours. Thyroid  function studies No results for input(s): TSH, T4TOTAL, T3FREE, THYROIDAB in the last 72 hours.  Invalid input(s): FREET3 Anemia work up No results for input(s): VITAMINB12, FOLATE, FERRITIN, TIBC, IRON , RETICCTPCT in the last 72 hours. Urinalysis    Component Value Date/Time   COLORURINE YELLOW (A) 08/15/2023 0113   APPEARANCEUR HAZY (A) 08/15/2023 0113   LABSPEC 1.013 08/15/2023 0113   PHURINE 5.0 08/15/2023 0113   GLUCOSEU NEGATIVE 08/15/2023 0113   HGBUR NEGATIVE 08/15/2023 0113   BILIRUBINUR NEGATIVE 08/15/2023 0113   KETONESUR NEGATIVE 08/15/2023 0113   PROTEINUR 30 (A) 08/15/2023 0113   NITRITE NEGATIVE 08/15/2023 0113   LEUKOCYTESUR NEGATIVE 08/15/2023 0113   Sepsis Labs Recent Labs  Lab 08/10/23 0745 08/13/23 1037 08/14/23 1925 08/15/23 0412  WBC 3.7* 3.1* 4.0 4.2   Microbiology Recent Results (from the past 240 hours)  Urine Culture     Status: Abnormal   Collection Time: 08/08/23  3:00 PM   Specimen: Urine, Clean Catch  Result Value Ref Range Status   Specimen  Description   Final    URINE, CLEAN CATCH Performed at [facility], [address], [place], [place] [address]    Special Requests   Final    NONE Performed at [facility], [address] [place], [place] [address]    Culture (A)  Final    >=100,000 COLONIES/mL ENTEROCOCCUS FAECALIS VANCOMYCIN  RESISTANT ENTEROCOCCUS ISOLATED    Report Status 08/10/2023 FINAL  Final  Organism ID, Bacteria ENTEROCOCCUS FAECALIS (A)  Final      Susceptibility   Enterococcus faecalis - MIC*    AMPICILLIN <=2 SENSITIVE Sensitive     NITROFURANTOIN <=16 SENSITIVE Sensitive     VANCOMYCIN  >=32 RESISTANT Resistant     * >=100,000 COLONIES/mL ENTEROCOCCUS FAECALIS  Blood culture (routine x 2)     Status: None   Collection Time: 08/08/23  5:39 PM   Specimen: BLOOD  Result Value Ref Range Status   Specimen Description BLOOD RIGHT ANTECUBITAL  Final   Special Requests   Final    BOTTLES DRAWN AEROBIC AND ANAEROBIC Blood Culture results may not be optimal due to an inadequate volume of blood received in culture bottles   Culture   Final    NO GROWTH 5 DAYS Performed at [facility], [address] [place], [place] [address]    Report Status 08/13/2023 FINAL  Final  Blood culture (routine x 2)     Status: None   Collection Time: 08/08/23  6:02 PM   Specimen: BLOOD  Result Value Ref Range Status   Specimen Description BLOOD BLOOD LEFT WRIST  Final   Special Requests   Final    BOTTLES DRAWN AEROBIC AND ANAEROBIC Blood Culture adequate volume   Culture   Final    NO GROWTH 5 DAYS Performed at [facility], [address] [place], [place] [address]    Report Status 08/13/2023 FINAL  Final  Resp panel by RT-PCR (RSV, Flu A&B, Covid) Anterior Nasal Swab     Status: None   Collection Time: 08/14/23  5:23 PM   Specimen: Anterior Nasal Swab  Result Value Ref Range Status   SARS Coronavirus 2 by RT PCR NEGATIVE NEGATIVE Final    Comment: (NOTE) SARS-CoV-2  target nucleic acids are NOT DETECTED.  The SARS-CoV-2 RNA is generally detectable in upper respiratory specimens during the acute phase of infection. The lowest concentration of SARS-CoV-2 viral copies this assay can detect is 138 copies/mL. A negative result does not preclude SARS-Cov-2 infection and should not be used as the sole basis for treatment or other patient management decisions. A negative result may occur with  improper specimen collection/handling, submission of specimen other than nasopharyngeal swab, presence of viral mutation(s) within the areas targeted by this assay, and inadequate number of viral copies(<138 copies/mL). A negative result must be combined with clinical observations, patient history, and epidemiological information. The expected result is Negative.  Fact Sheet for Patients:  BloggerCourse.com  Fact Sheet for Healthcare Providers:  SeriousBroker.it  This test is no t yet approved or cleared by the [place]  FDA and  has been authorized for detection and/or diagnosis of SARS-CoV-2 by FDA under an Emergency Use Authorization (EUA). This EUA will remain  in effect (meaning this test can be used) for the duration of the COVID-19 declaration under Section 564(b)(1) of the Act, 21 U.S.C.section 360bbb-3(b)(1), unless the authorization is terminated  or revoked sooner.       Influenza A by PCR NEGATIVE NEGATIVE Final   Influenza B by PCR NEGATIVE NEGATIVE Final    Comment: (NOTE) The Xpert Xpress SARS-CoV-2/FLU/RSV plus assay is intended as an aid in the diagnosis of influenza from Nasopharyngeal swab specimens and should not be used as a sole basis for treatment. Nasal washings and aspirates are unacceptable for Xpert Xpress SARS-CoV-2/FLU/RSV testing.  Fact Sheet for Patients: BloggerCourse.com  Fact Sheet for Healthcare  Providers: SeriousBroker.it  This test is not yet approved or  cleared by the [place]  FDA and has been authorized for detection and/or diagnosis of SARS-CoV-2 by FDA under an Emergency Use Authorization (EUA). This EUA will remain in effect (meaning this test can be used) for the duration of the COVID-19 declaration under Section 564(b)(1) of the Act, 21 U.S.C. section 360bbb-3(b)(1), unless the authorization is terminated or revoked.     Resp Syncytial Virus by PCR NEGATIVE NEGATIVE Final    Comment: (NOTE) Fact Sheet for Patients: BloggerCourse.com  Fact Sheet for Healthcare Providers: SeriousBroker.it  This test is not yet approved or cleared by the [place]  FDA and has been authorized for detection and/or diagnosis of SARS-CoV-2 by FDA under an Emergency Use Authorization (EUA). This EUA will remain in effect (meaning this test can be used) for the duration of the COVID-19 declaration under Section 564(b)(1) of the Act, 21 U.S.C. section 360bbb-3(b)(1), unless the authorization is terminated or revoked.  Performed at [facility], [address] [place], [place] [address]   Blood Culture (routine x 2)     Status: None (Preliminary result)   Collection Time: 08/14/23  7:25 PM   Specimen: BLOOD LEFT ARM  Result Value Ref Range Status   Specimen Description   Final    BLOOD LEFT ARM Performed at [facility], [address] [place], [place] [address]    Special Requests   Final    BOTTLES DRAWN AEROBIC AND ANAEROBIC Blood Culture adequate volume Performed at [facility], [address] [place], [place] [address]    Culture   Final    NO GROWTH < 24 HOURS Performed at [facility], [address], [place], [place] [address]    Report Status PENDING  Incomplete  Blood Culture (routine x 2)     Status: None (Preliminary result)   Collection Time:  08/14/23  7:45 PM   Specimen: BLOOD  Result Value Ref Range Status   Specimen Description   Final    BLOOD BLOOD LEFT HAND Performed at [facility], [address] [place], [place] [address]    Special Requests   Final    BOTTLES DRAWN AEROBIC AND ANAEROBIC Blood Culture results may not be optimal due to an inadequate volume of blood received in culture bottles Performed at [facility], [address], [place], [place] [address]    Culture   Final    NO GROWTH < 24 HOURS Performed at [facility], [address], [place], [place] [address]    Report Status PENDING  Incomplete  MRSA Next Gen by PCR, Nasal     Status: None   Collection Time: 08/14/23 11:15 PM   Specimen: Nasal Mucosa; Nasal Swab  Result Value Ref Range Status   MRSA by PCR Next Gen NOT DETECTED NOT DETECTED Final    Comment: (NOTE) The GeneXpert MRSA Assay (FDA approved for NASAL specimens only), is one component of a comprehensive MRSA colonization surveillance program. It is not intended to diagnose MRSA infection nor to guide or monitor treatment for MRSA infections. Test performance is not FDA approved in patients less than 36 years old. Performed at [facility], [address], [place], [place] [address]      Time coordinating discharge: 32 minutes  SIGNED:   Calvin KATHEE Robson, MD  [facility] 08/15/2023, 1:27 PM Pager   If 7PM-7AM, please contact night-coverage
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
## 2023-08-15 NOTE — Progress Notes (Signed)
 Heart Failure Navigator Progress Note  Assessed for Heart & Vascular TOC clinic readiness.  Unfortunately, patient does not meet criteria due to discharge status to [facility].  Navigator will sign off at this time.  Charmaine Pines, RN, BSN [facility] Heart Failure Navigator Secure Chat Only
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
## 2023-08-15 NOTE — Evaluation (Addendum)
 Clinical/Bedside Swallow Evaluation Patient Details  Name: Robert [name] MRN: [id number] Date of Birth: January 11, 1946  Today's Date: 08/15/2023 Time: SLP Start Time (ACUTE ONLY): 1200 SLP Stop Time (ACUTE ONLY): 1245 SLP Time Calculation (min) (ACUTE ONLY): 45 min  Past Medical History:  Past Medical History:  Diagnosis Date   Allergy    Cellulitis    Diabetes mellitus without complication (HCC)    Hyperlipidemia    Hypertension    Leukemia (HCC)    Peripheral vascular disease (HCC)    Past Surgical History:  Past Surgical History:  Procedure Laterality Date   BIOPSY  08/21/2022   Procedure: BIOPSY;  Surgeon: Onita Elspeth Sharper, DO;  Location: [facility] ENDOSCOPY;  Service: Gastroenterology;;   COLONOSCOPY WITH PROPOFOL  N/A 10/02/2017   Procedure: COLONOSCOPY WITH PROPOFOL ;  Surgeon: Therisa Bi, MD;  Location: [facility] ENDOSCOPY;  Service: Gastroenterology;  Laterality: N/A;   COLONOSCOPY WITH PROPOFOL  N/A 08/21/2022   Procedure: COLONOSCOPY WITH PROPOFOL ;  Surgeon: Onita Elspeth Sharper, DO;  Location: [facility] ENDOSCOPY;  Service: Gastroenterology;  Laterality: N/A;   ESOPHAGOGASTRODUODENOSCOPY (EGD) WITH PROPOFOL  N/A 08/11/2022   Procedure: ESOPHAGOGASTRODUODENOSCOPY (EGD) WITH PROPOFOL ;  Surgeon: Jinny Carmine, MD;  Location: [facility] ENDOSCOPY;  Service: Endoscopy;  Laterality: N/A;   HERNIA REPAIR     IR BONE MARROW BIOPSY & ASPIRATION  04/10/2023   PERICARDIOCENTESIS N/A 08/29/2022   Procedure: PERICARDIOCENTESIS;  Surgeon: Mady Bruckner, MD;  Location: [facility] INVASIVE CV LAB;  Service: Cardiovascular;  Laterality: N/A;   POLYPECTOMY  08/21/2022   Procedure: POLYPECTOMY;  Surgeon: Onita Elspeth Sharper, DO;  Location: [facility] ENDOSCOPY;  Service: Gastroenterology;;   HPI:  Pt is a 78 y.o. 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  He presented by EMS from SNF after they were called out for altered mental status.  In the ED his vitals were initially within normal limits however he desaturated to as low as 75% and is now on O2 at 4 L.  Chest x-ray shows worsening left perihilar and basilar airspace consolidation with pleural effusions and head CT nonacute.  Patient started on vancomycin  and cefepime  and given an LR bolus as well as potassium repletion.  Also treated with a dose of IV Lasix  40 for CHF.  Pt was just D/C'd on 08/10/2023 to SNF.    Assessment / Plan / Recommendation  Clinical Impression   Pt seen for attempted BSE today. Pt lying in bed, poorly responsive. Bilat. Mitts+; receiving blood transfusion and tended to draw his arms up to his face. He grunted and made phonations when given stim, or when name was called and given gentle touch. He did not verbalize nor follow any directions.  On [place] 3L; afebrile. WBC WNL.  OF NOTE: per TOC, yesterday, pt was to be admitted to [facility] where he resides.   Pt appears to present w/ concern for oropharyngeal phase dysphagia in setting of SIGNIFICANTLY declined Cognitive status and functioning. Unsure of pt's Baseline Cognitive functioning and engagement. ANY Cognitive decline can impact overall awareness/timing of swallow and safety during po tasks which increases risk for aspiration, choking.  Pt's risk for aspiration/aspiration pneumonia appears PROFOUND at this time in setting  of his declined Cognitive functioning and alertness; po assessment was UNABLE to be completed d/t his agitation w/ oral tasks. RECOMMEND STRICT NPO STATUS.         Oral care and gentle touch around mouth were attempted to initiate the BSE. Pt responded w/ immediate agitation and  brought his hands up to his mouth, then swatted at this SLP's hands when presenting washcloth at his mouth. PROFOUND confusion of oral care noted w/ no acceptance/tolerance for such despite encouragement and attempts at hand over hand guidance.         In setting of pt's significant agitation and Confusion, NO po trials were attempted this session. Recommend strict NPO status w/ oral care when pt allows- for hygiene and stimulation of swallowing. ST services will f/u w/ ongoing assessment per pt's medical status; Cognition/alertness.  Consulted MD re: the above and concern for PROFOUND risk for aspiration w/ any oral intake. MD/NSG agreed.   ST services recommends follow w/ Palliative Care for GOC and education re: impact of Cognitive decline on swallowing, oral intake. Precautions posted in chart/room.  MD indicated reaching out to Palliative Care/Hospice services today. Will monitor.  SLP Visit Diagnosis: Dysphagia, oropharyngeal phase (R13.12) (in setting of SEVERE Cognitive decline and agitation; poor State awareness)    Aspiration Risk  Severe aspiration risk;Risk for inadequate nutrition/hydration    Diet Recommendation   NPO- oral care  Medication Administration: Via alternative means    Other  Recommendations Recommended Consults:  (Palliative Care; Dietician) Oral Care Recommendations: Oral care QID;Staff/trained caregiver to provide oral care (precautions) Caregiver Recommendations:  (TBD)     Assistance Recommended at Discharge  FULL  Functional Status Assessment Patient has had a recent decline in their functional status and/or demonstrates limited ability to make significant improvements in function in a reasonable and predictable amount of time  Frequency and Duration min 1 x/week  1 week       Prognosis Prognosis for improved oropharyngeal function: Guarded Barriers to Reach Goals: Cognitive deficits;Language deficits;Time post onset;Severity of  deficits;Behavior Barriers/Prognosis Comment: Cognitive decline appearance      Swallow Study   General Date of Onset: 08/14/23 HPI: Pt is a 78 y.o. male with medical history significant for being followed at the 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  He presented by EMS from SNF after they were called out for altered mental status.  In the ED his vitals were initially within normal limits however he desaturated to as low as 75% and is now on O2 at 4 L.  Chest x-ray shows worsening left perihilar and basilar airspace consolidation with pleural effusions and head CT nonacute.  Patient started on vancomycin  and cefepime  and given an LR bolus as well as potassium repletion.  Also treated with a dose of IV Lasix  40 for CHF.  Pt was just D/C'd on 08/10/2023 to SNF. Type of Study: Bedside Swallow Evaluation Previous Swallow Assessment: 03/2023 Diet Prior to this Study: Regular;Thin liquids (Level 0) (NSG has been holding po's per report) Temperature Spikes Noted: No (wbc 4.2) Respiratory Status: Nasal cannula (3L) History of Recent Intubation: No Behavior/Cognition: Agitated;Confused;Doesn't follow directions Oral Cavity Assessment: Dry;Dried secretions Oral Care Completed by SLP:  (attempted but pt was defensive  and could not complete) Oral Cavity - Dentition: Missing dentition (appeared) Vision:  (n/a) Self-Feeding Abilities: Refused PO (agitated) Patient Positioning: Upright in bed (full assist) Baseline Vocal Quality:  (nonverbal) Volitional Cough: Cognitively unable to elicit Volitional Swallow: Unable to elicit    Oral/Motor/Sensory Function Overall Oral Motor/Sensory Function:  (CNT d/t  agitation)   Ice Chips Ice chips: Not tested   Thin Liquid Thin Liquid: Not tested    Nectar Thick Nectar Thick Liquid: Not tested   Honey Thick Honey Thick Liquid: Not tested   Puree Puree: Not tested   Solid     Solid: Not tested        Comer Portugal, MS, CCC-SLP Speech Language Pathologist Rehab Services; [facility] [phone] (ascom) Zophia Marrone 08/15/2023,3:56 PM
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
## 2023-08-15 NOTE — Plan of Care (Signed)

## 2023-08-15 NOTE — TOC Transition Note (Signed)
 Transition of Care [facility]) - Discharge Note   Patient Details  Name: Robert [name] MRN: [id number] Date of Birth: May 05, 1945  Transition of Care [facility]) CM/SW Contact:  Iesha Summerhill C Leasha Goldberger, RN Phone Number: 08/15/2023, 2:55 PM   Clinical Narrative:    Beatris with Therisa in admissions  at [facility] Per facility patient admission confirmed for today. Patient assigned room # 110 Report will be called to [phone] Face sheet and medical necessity forms printed to the floor to be added to the EMS pack EMS arranged  He's next.  Discharge summary and SNF transfer report sent in HUB.  Nurse, and family notified spoke with patient's wife. Rosina from [facility] advised of patient's discharge. TOC signing off.    Final next level of care: Skilled Nursing Facility (Hospice via [facility]) Barriers to Discharge: Barriers Resolved   Patient Goals and CMS Choice Patient states their goals for this hospitalization and ongoing recovery are:: Snf with hospice care CMS Medicare.gov Compare Post Acute Care list provided to:: Patient Represenative (must comment)        Discharge Placement              Patient chooses bed at: [facility] (with [facility] Patient to be transferred to facility by: [facility] Name of family member notified: Dinnis Rog Patient and family notified of of transfer: 08/15/23  Discharge Plan and Services Additional resources added to the After Visit Summary for                                       Social Drivers of Health (SDOH) Interventions SDOH Screenings   Food Insecurity: No Food Insecurity (08/15/2023)  Housing: Low Risk  (08/15/2023)  Transportation Needs: No Transportation Needs (08/15/2023)  Utilities: Not At Risk (08/15/2023)  Alcohol Screen: Low Risk  (10/29/2020)  Depression (PHQ2-9): Low Risk  (09/12/2021)  Financial Resource Strain: Low Risk  (12/15/2022)   Received from [facility]   Physical Activity: Inactive (10/29/2020)  Social Connections: Unknown (08/15/2023)  Stress: No Stress Concern Present (10/29/2020)  Tobacco Use: Low Risk  (08/14/2023)     Readmission Risk Interventions    04/04/2023   12:56 PM 02/07/2023   12:33 PM  Readmission Risk Prevention Plan  Transportation Screening Complete Complete  Medication Review (RN Care Manager) Complete Complete  PCP or Specialist appointment within 3-5 days of discharge Complete Complete  SW Recovery Care/Counseling Consult Complete Complete  Palliative Care Screening Not Applicable Not Applicable  Skilled Nursing Facility Complete Complete
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
## 2023-08-15 NOTE — Consult Note (Signed)
 Palliative Medicine [facility] at [facility] [phone] [phone]   Name: Robert [name] Date: 08/15/2023 MRN: [id number]  DOB: February 06, 1946  Patient Care Team: Robert King, MD as PCP - General (Internal Medicine) Robert Annah JAYSON, MD as Consulting Physician (Oncology)    REASON FOR CONSULTATION: Robert [name] is a 78 y.o. male with multiple medical problems including AML not currently in remission. Patient has been hospitalized after each trial of decitabine . Further chemotherapy was not recommended. Hospice and comfort care were discussed but patient opted to continue transfusions.  Patient has multiple recent hospitalizations including 08/08/2023 to 08/10/2023 with altered mental status from UTI.  Patient is now readmitted 08/14/2023 with altered mental status and presumed pneumonia +/- CHF. Palliative care was consulted to address goals and manage ongoing symptoms.   SOCIAL HISTORY:     reports that he has never smoked. He has never used smokeless tobacco. He reports that he does not currently use alcohol. He reports that he does not use drugs.  Patient is married.  He is a resident at [facility].  ADVANCE DIRECTIVES:  Not on file  CODE STATUS: DNR  PAST MEDICAL HISTORY: Past Medical History:  Diagnosis Date   Allergy    Cellulitis    Diabetes mellitus without complication (HCC)    Hyperlipidemia    Hypertension    Leukemia (HCC)    Peripheral vascular disease (HCC)     PAST SURGICAL HISTORY:  Past Surgical History:  Procedure Laterality Date   BIOPSY  08/21/2022   Procedure: BIOPSY;  Surgeon: Onita Elspeth Sharper, DO;  Location: [facility] ENDOSCOPY;  Service: Gastroenterology;;   COLONOSCOPY WITH PROPOFOL  N/A 10/02/2017   Procedure: COLONOSCOPY WITH PROPOFOL ;  Surgeon: Therisa Bi, MD;  Location: [facility] ENDOSCOPY;  Service: Gastroenterology;  Laterality: N/A;   COLONOSCOPY WITH PROPOFOL  N/A 08/21/2022   Procedure:  COLONOSCOPY WITH PROPOFOL ;  Surgeon: Onita Elspeth Sharper, DO;  Location: [facility] ENDOSCOPY;  Service: Gastroenterology;  Laterality: N/A;   ESOPHAGOGASTRODUODENOSCOPY (EGD) WITH PROPOFOL  N/A 08/11/2022   Procedure: ESOPHAGOGASTRODUODENOSCOPY (EGD) WITH PROPOFOL ;  Surgeon: Jinny Carmine, MD;  Location: [facility] ENDOSCOPY;  Service: Endoscopy;  Laterality: N/A;   HERNIA REPAIR     IR BONE MARROW BIOPSY & ASPIRATION  04/10/2023   PERICARDIOCENTESIS N/A 08/29/2022   Procedure: PERICARDIOCENTESIS;  Surgeon: Mady Bruckner, MD;  Location: [facility] INVASIVE CV LAB;  Service: Cardiovascular;  Laterality: N/A;   POLYPECTOMY  08/21/2022   Procedure: POLYPECTOMY;  Surgeon: Onita Elspeth Sharper, DO;  Location: [facility] ENDOSCOPY;  Service: Gastroenterology;;    HEMATOLOGY/ONCOLOGY HISTORY:  Oncology History  Malignant neoplasm of sigmoid colon (HCC)  08/25/2022 Initial Diagnosis   Malignant neoplasm of sigmoid colon (HCC)   03/21/2023 Cancer Staging   Staging form: Colon and Rectum, AJCC 8th Edition - Pathologic stage from 03/21/2023: Stage I (pT1, pN0, cM0) - Signed by Robert Annah JAYSON, MD on 03/21/2023 Stage prefix: Initial diagnosis Total positive nodes: 0   AML (acute myeloblastic leukemia) (HCC)  05/01/2023 Initial Diagnosis   AML (acute myeloblastic leukemia) (HCC)   05/09/2023 -  Chemotherapy   Patient is on Treatment Plan : LEUKEMIA AML Decitabine  weekly x 4  q28d       ALLERGIES:  has no known allergies.  MEDICATIONS:  Current Facility-Administered Medications  Medication Dose Route Frequency Provider Last Rate Last Admin   acetaminophen  (TYLENOL ) tablet 650 mg  650 mg Oral Q6H PRN Duncan, Hazel V, MD       Or   acetaminophen  (  TYLENOL ) suppository 650 mg  650 mg Rectal Q6H PRN Duncan, Hazel V, MD       albuterol  (PROVENTIL ) (2.5 MG/3ML) 0.083% nebulizer solution 2.5 mg  2.5 mg Nebulization Q2H PRN Cleatus Delayne GAILS, MD       dextrose  5 % in lactated ringers  infusion   Intravenous Continuous Jhonny Sahara B, MD 75 mL/hr at 08/15/23 1029 New Bag at 08/15/23 1029   feeding supplement (ENSURE PLUS HIGH PROTEIN) liquid 237 mL  237 mL Oral BID BM Jhonny Sahara B, MD       furosemide  (LASIX ) injection 20 mg  20 mg Intravenous Q12H Cleatus Delayne GAILS, MD   20 mg at 08/15/23 0534   haloperidol lactate (HALDOL) injection 1 mg  1 mg Intravenous Q2H PRN Duncan, Hazel V, MD   1 mg at 08/15/23 0110   latanoprost  (XALATAN ) 0.005 % ophthalmic solution 1 drop  1 drop Both Eyes QHS Cleatus Delayne GAILS, MD   1 drop at 08/14/23 2301   melatonin tablet 5 mg  5 mg Oral QHS Duncan, Hazel V, MD       metoprolol  succinate (TOPROL -XL) 24 hr tablet 50 mg  50 mg Oral Q breakfast Duncan, Hazel V, MD       ondansetron  (ZOFRAN ) tablet 4 mg  4 mg Oral Q6H PRN Duncan, Hazel V, MD       Or   ondansetron  (ZOFRAN ) injection 4 mg  4 mg Intravenous Q6H PRN Duncan, Hazel V, MD       piperacillin -tazobactam (ZOSYN ) IVPB 3.375 g  3.375 g Intravenous Q8H Sreenath, Sudheer B, MD        VITAL SIGNS: BP (!) 127/92   Pulse 72   Temp 97.7 F (36.5 C) (Oral)   Resp 19   Ht 6' 2 (1.88 m)   Wt 190 lb 4.1 oz (86.3 kg)   SpO2 100%   BMI 24.43 kg/m  Filed Weights   08/14/23 1717 08/14/23 2232 08/15/23 0500  Weight: 203 lb (92.1 kg) 190 lb 4.1 oz (86.3 kg) 190 lb 4.1 oz (86.3 kg)    Estimated body mass index is 24.43 kg/m as calculated from the following:   Height as of this encounter: 6' 2 (1.88 m).   Weight as of this encounter: 190 lb 4.1 oz (86.3 kg).  LABS: CBC:    Component Value Date/Time   WBC 4.2 08/15/2023 0412   HGB 6.3 (L) 08/15/2023 0412   HGB 8.5 (L) 08/13/2023 1037   HCT 20.8 (L) 08/15/2023 0412   PLT 35 (L) 08/15/2023 0412   PLT 47 (L) 08/13/2023 1037   MCV 92.4 08/15/2023 0412   NEUTROABS 0.7 (L) 08/14/2023 1925   LYMPHSABS 2.9 08/14/2023 1925   MONOABS 0.3 08/14/2023 1925   EOSABS 0.0 08/14/2023 1925   BASOSABS 0.0 08/14/2023 1925   Comprehensive Metabolic Panel:    Component Value Date/Time    NA 143 08/15/2023 0412   K 3.5 08/15/2023 0412   CL 107 08/15/2023 0412   CO2 26 08/15/2023 0412   BUN 26 (H) 08/15/2023 0412   CREATININE 2.00 (H) 08/15/2023 0412   CREATININE 1.66 (H) 08/06/2023 1038   CREATININE 1.69 (H) 07/12/2021 1516   GLUCOSE 76 08/15/2023 0412   CALCIUM 8.0 (L) 08/15/2023 0412   AST 18 08/14/2023 1925   AST 16 08/06/2023 1038   ALT 9 08/14/2023 1925   ALT 8 08/06/2023 1038   ALKPHOS 62 08/14/2023 1925   BILITOT 1.1 08/14/2023 1925   BILITOT 0.9  08/06/2023 1038   PROT 7.3 08/14/2023 1925   ALBUMIN  1.9 (L) 08/14/2023 1925    RADIOGRAPHIC STUDIES: CT Head Wo Contrast Result Date: 08/14/2023 CLINICAL DATA:  Altered mental status EXAM: CT HEAD WITHOUT CONTRAST TECHNIQUE: Contiguous axial images were obtained from the base of the skull through the vertex without intravenous contrast. RADIATION DOSE REDUCTION: This exam was performed according to the departmental dose-optimization program which includes automated exposure control, adjustment of the mA and/or kV according to patient size and/or use of iterative reconstruction technique. COMPARISON:  08/08/2023 FINDINGS: Brain: No acute intracranial abnormality. Specifically, no hemorrhage, hydrocephalus, mass lesion, acute infarction, or significant intracranial injury. There is atrophy and chronic small vessel disease changes. Vascular: No hyperdense vessel or unexpected calcification. Skull: No acute calvarial abnormality. Sinuses/Orbits: Mucosal thickening within the paranasal sinuses. No air-fluid levels. Complete opacification of the right mastoid air cells and partial opacification with fluid in the left mastoid air cells, unchanged. Other: None IMPRESSION: Atrophy, chronic microvascular disease. No acute intracranial abnormality. Chronic sinusitis. Bilateral mastoid effusions, right greater than left, stable. Electronically Signed   By: Franky Crease M.D.   On: 08/14/2023 18:06   DG Chest Port 1 View Result Date:  08/14/2023 CLINICAL DATA:  Sepsis, altered mental status EXAM: PORTABLE CHEST - 1 VIEW COMPARISON:  08/08/2023 FINDINGS: Worsening left perihilar and basilar airspace consolidation. Mild right perihilar pulmonary vascular congestion. Pleural effusions left greater than right. Heart size difficult to assess due to adjacent opacities. Aortic Atherosclerosis (ICD10-170.0). Vertebral endplate spurring at multiple levels in the lower thoracic spine. IMPRESSION: Worsening left perihilar and basilar airspace consolidation with pleural effusions. Electronically Signed   By: JONETTA Faes M.D.   On: 08/14/2023 17:46   CT Head Wo Contrast Result Date: 08/08/2023 CLINICAL DATA:  Mental status change, unknown cause; Sublingual/submandibular abscess left facial swelling EXAM: CT HEAD WITHOUT CONTRAST CT MAXILLOFACIAL WITHOUT CONTRAST TECHNIQUE: Multidetector CT imaging of the head and maxillofacial structures were performed using the standard protocol without intravenous contrast. Multiplanar CT image reconstructions of the maxillofacial structures were also generated. RADIATION DOSE REDUCTION: This exam was performed according to the departmental dose-optimization program which includes automated exposure control, adjustment of the mA and/or kV according to patient size and/or use of iterative reconstruction technique. COMPARISON:  CT head 04/03/2023 FINDINGS: CT HEAD FINDINGS Brain: Patchy and confluent areas of decreased attenuation are noted throughout the deep and periventricular white matter of the cerebral hemispheres bilaterally, compatible with chronic microvascular ischemic disease. No evidence of large-territorial acute infarction. No parenchymal hemorrhage. No mass lesion. No extra-axial collection. No mass effect or midline shift. No hydrocephalus. Basilar cisterns are patent. Vascular: No hyperdense vessel. Atherosclerotic calcifications are present within the cavernous internal carotid and vertebral arteries.  Skull: No acute fracture or focal lesion. Other: None. CT MAXILLOFACIAL FINDINGS Osseous: No fracture or mandibular dislocation. No destructive process. Sinuses/Orbits: Complete right mastoid air cell effusion with fluid within the right middle ear. Partial left mastoid air cell effusion with fluid within the left middle ear. Paranasal sinuses are clear. Bilateral lens replacement. Otherwise the orbits are unremarkable. Soft tissues: Negative. IMPRESSION: 1. No acute intracranial abnormality. 2.  No acute displaced facial fracture. 3. Complete right mastoid air cell effusion with fluid within the right middle ear. Partial left mastoid air cell effusion with fluid within the left middle ear. Recommend correlation with physical exam for infection. In a Electronically Signed   By: Morgane  Naveau M.D.   On: 08/08/2023 16:22   CT Maxillofacial W Contrast  Result Date: 08/08/2023 CLINICAL DATA:  Mental status change, unknown cause; Sublingual/submandibular abscess left facial swelling EXAM: CT HEAD WITHOUT CONTRAST CT MAXILLOFACIAL WITHOUT CONTRAST TECHNIQUE: Multidetector CT imaging of the head and maxillofacial structures were performed using the standard protocol without intravenous contrast. Multiplanar CT image reconstructions of the maxillofacial structures were also generated. RADIATION DOSE REDUCTION: This exam was performed according to the departmental dose-optimization program which includes automated exposure control, adjustment of the mA and/or kV according to patient size and/or use of iterative reconstruction technique. COMPARISON:  CT head 04/03/2023 FINDINGS: CT HEAD FINDINGS Brain: Patchy and confluent areas of decreased attenuation are noted throughout the deep and periventricular white matter of the cerebral hemispheres bilaterally, compatible with chronic microvascular ischemic disease. No evidence of large-territorial acute infarction. No parenchymal hemorrhage. No mass lesion. No extra-axial  collection. No mass effect or midline shift. No hydrocephalus. Basilar cisterns are patent. Vascular: No hyperdense vessel. Atherosclerotic calcifications are present within the cavernous internal carotid and vertebral arteries. Skull: No acute fracture or focal lesion. Other: None. CT MAXILLOFACIAL FINDINGS Osseous: No fracture or mandibular dislocation. No destructive process. Sinuses/Orbits: Complete right mastoid air cell effusion with fluid within the right middle ear. Partial left mastoid air cell effusion with fluid within the left middle ear. Paranasal sinuses are clear. Bilateral lens replacement. Otherwise the orbits are unremarkable. Soft tissues: Negative. IMPRESSION: 1. No acute intracranial abnormality. 2.  No acute displaced facial fracture. 3. Complete right mastoid air cell effusion with fluid within the right middle ear. Partial left mastoid air cell effusion with fluid within the left middle ear. Recommend correlation with physical exam for infection. In a Electronically Signed   By: Morgane  Naveau M.D.   On: 08/08/2023 16:22   DG Chest Port 1 View Result Date: 08/08/2023 CLINICAL DATA:  shortness of breath EXAM: PORTABLE CHEST 1 VIEW COMPARISON:  Chest x-ray 06/07/2023, chest x-ray 03/17/2023, chest x-ray 04/03/2023 FINDINGS: Patient is rotated. Prominent cardiomediastinal silhouette likely due to AP portable technique. The heart and mediastinal contours are grossly unchanged given patient rotation. No focal consolidation. Chronic coarsened interstitial markings with question pulmonary edema. Bilateral at least trace pleural effusions. No pneumothorax. No acute osseous abnormality. IMPRESSION: 1. Bilateral at least trace pleural effusions. 2. Chronic coarsened interstitial markings with question pulmonary edema. 3. Limited evaluation due to patient rotation. Consider chest x-ray PA and lateral repeat. Electronically Signed   By: Morgane  Naveau M.D.   On: 08/08/2023 15:59    PERFORMANCE  STATUS (ECOG) : 3 - Symptomatic, >50% confined to bed  Review of Systems Unable to complete  Physical Exam General: Frail appearing Pulmonary: Labored Extremities: no edema, no joint deformities Skin: no rashes Neurological: Poorly responsive  IMPRESSION: Patient lethargic.  Unable to engage in a conversation regarding goals.  Patient has transfusion dependent AML and is not on chemotherapy due to poor tolerance.  He has had multiple recent hospitalizations with overall declining performance status.  He is now readmitted with presumed pneumonia/CHF.  Breathing appears somewhat labored.  Prognosis is poor and patient appears to be active in dying process.  I called and spoke with patient's wife by phone.  I updated her on patient's clinical status.  She verbalized understanding that patient is likely rapidly nearing end-of-life.  Per wife, there was a plan to start patient on hospice services at SNF yesterday but she opted to send patient back to the hospital because he was declining and patient was not yet enrolled in hospice.    Wife says  that her goal is to keep patient comfortable until his end-of-life.  We discussed potential options for his end-of-life care including comfort care at the hospital versus inpatient hospice facility versus SNF with hospice.  Ultimately, she requested the patient be transferred back to SNF with hospice.  Will have social worker coordinate with SNF as patient will need rapid hospice assessment.  Patient is a DNR/DNI.   PLAN: - Recommend comfort measures - SNF with hospice  Case and plan discussed with Dr. Jhonny  Time Total: 60 minutes  Visit consisted of counseling and education dealing with the complex and emotionally intense issues of symptom management and palliative care in the setting of serious and potentially life-threatening illness.Greater than 50%  of this time was spent counseling and coordinating care related to the above assessment and  plan.  Signed by: Fonda Mower, PhD, NP-C

## 2023-08-16 LAB — BLOOD GAS, VENOUS
Bicarbonate: 31.1 mmol/L — ABNORMAL HIGH (ref 20.0–28.0)
O2 Saturation: 24.7 mmol/L — ABNORMAL HIGH (ref 0.0–2.0)
Patient temperature: 37
Patient temperature: 37 %
pCO2, Ven: 49 mmHg (ref 44–60)
pH, Ven: 7.41 (ref 7.25–7.43)
pO2, Ven: <31.1 mmol/L — CL (ref 32–45)

## 2023-08-16 LAB — BPAM RBC
Blood Product Expiration Date: 202507132359
ISSUE DATE / TIME: 202506251118
Unit Type and Rh: 5100

## 2023-08-16 LAB — TYPE AND SCREEN
ABO/RH(D): AB POS
Antibody Screen: NEGATIVE
Unit division: 0

## 2023-08-16 LAB — CULTURE, BLOOD (ROUTINE X 2)

## 2023-08-19 LAB — CULTURE, BLOOD (ROUTINE X 2)
Culture: NO GROWTH
Culture: NO GROWTH
Special Requests: ADEQUATE

## 2023-08-20 ENCOUNTER — Inpatient Hospital Stay: Admitting: Oncology

## 2023-08-20 ENCOUNTER — Inpatient Hospital Stay

## 2023-08-21 ENCOUNTER — Inpatient Hospital Stay

## 2023-09-21 DEATH — deceased
